# Patient Record
Sex: Female | Born: 1945
Health system: Southern US, Community
[De-identification: ages and names within clinical notes are randomized; demographics above are authoritative.]

## PROBLEM LIST (undated history)

## (undated) DIAGNOSIS — R51 Headache: Secondary | ICD-10-CM

## (undated) DIAGNOSIS — F329 Major depressive disorder, single episode, unspecified: Secondary | ICD-10-CM

## (undated) DIAGNOSIS — Z9889 Other specified postprocedural states: Secondary | ICD-10-CM

## (undated) DIAGNOSIS — M797 Fibromyalgia: Secondary | ICD-10-CM

## (undated) DIAGNOSIS — H3530 Unspecified macular degeneration: Secondary | ICD-10-CM

## (undated) DIAGNOSIS — R0602 Shortness of breath: Secondary | ICD-10-CM

## (undated) DIAGNOSIS — R011 Cardiac murmur, unspecified: Secondary | ICD-10-CM

## (undated) DIAGNOSIS — E039 Hypothyroidism, unspecified: Secondary | ICD-10-CM

## (undated) DIAGNOSIS — J45909 Unspecified asthma, uncomplicated: Secondary | ICD-10-CM

## (undated) DIAGNOSIS — J189 Pneumonia, unspecified organism: Secondary | ICD-10-CM

## (undated) DIAGNOSIS — I4891 Unspecified atrial fibrillation: Secondary | ICD-10-CM

## (undated) DIAGNOSIS — I509 Heart failure, unspecified: Secondary | ICD-10-CM

## (undated) DIAGNOSIS — F32A Depression, unspecified: Secondary | ICD-10-CM

## (undated) DIAGNOSIS — G43909 Migraine, unspecified, not intractable, without status migrainosus: Secondary | ICD-10-CM

## (undated) DIAGNOSIS — R112 Nausea with vomiting, unspecified: Secondary | ICD-10-CM

## (undated) DIAGNOSIS — K219 Gastro-esophageal reflux disease without esophagitis: Secondary | ICD-10-CM

## (undated) DIAGNOSIS — I499 Cardiac arrhythmia, unspecified: Secondary | ICD-10-CM

## (undated) DIAGNOSIS — R42 Dizziness and giddiness: Secondary | ICD-10-CM

## (undated) DIAGNOSIS — M1990 Unspecified osteoarthritis, unspecified site: Secondary | ICD-10-CM

## (undated) DIAGNOSIS — E785 Hyperlipidemia, unspecified: Secondary | ICD-10-CM

## (undated) DIAGNOSIS — I10 Essential (primary) hypertension: Secondary | ICD-10-CM

## (undated) DIAGNOSIS — R0601 Orthopnea: Secondary | ICD-10-CM

## (undated) DIAGNOSIS — I4892 Unspecified atrial flutter: Secondary | ICD-10-CM

## (undated) HISTORY — DX: Heart failure, unspecified: I50.9

## (undated) HISTORY — PX: COLONOSCOPY W/ POLYPECTOMY: SHX1380

## (undated) HISTORY — PX: KNEE ARTHROSCOPY: SUR90

## (undated) HISTORY — PX: COLONOSCOPY W/ BIOPSIES: SHX1374

## (undated) HISTORY — DX: Fibromyalgia: M79.7

## (undated) HISTORY — PX: BREAST SURGERY: SHX581

## (undated) HISTORY — DX: Unspecified atrial flutter: I48.92

## (undated) HISTORY — PX: OOPHORECTOMY: SHX86

## (undated) HISTORY — PX: TUBAL LIGATION: SHX77

## (undated) HISTORY — DX: Unspecified atrial fibrillation: I48.91

## (undated) HISTORY — PX: BREAST BIOPSY: SHX20

## (undated) HISTORY — DX: Unspecified macular degeneration: H35.30

---

## 1971-08-11 HISTORY — PX: DILATION AND CURETTAGE OF UTERUS: SHX78

## 2001-08-10 HISTORY — PX: OTHER SURGICAL HISTORY: SHX169

## 2004-07-02 ENCOUNTER — Ambulatory Visit: Payer: Self-pay | Admitting: Rheumatology

## 2006-07-07 ENCOUNTER — Ambulatory Visit: Payer: Self-pay | Admitting: Internal Medicine

## 2006-07-28 ENCOUNTER — Ambulatory Visit: Payer: Self-pay | Admitting: Unknown Physician Specialty

## 2006-08-05 ENCOUNTER — Ambulatory Visit: Payer: Self-pay

## 2006-08-10 HISTORY — PX: JOINT REPLACEMENT: SHX530

## 2006-08-10 HISTORY — PX: HERNIA REPAIR: SHX51

## 2006-08-10 HISTORY — PX: DIAGNOSTIC LAPAROSCOPY: SUR761

## 2006-08-27 ENCOUNTER — Ambulatory Visit: Payer: Self-pay | Admitting: Unknown Physician Specialty

## 2006-09-13 ENCOUNTER — Ambulatory Visit: Payer: Self-pay | Admitting: Family Medicine

## 2006-09-23 ENCOUNTER — Ambulatory Visit: Payer: Self-pay | Admitting: Obstetrics & Gynecology

## 2006-10-07 ENCOUNTER — Ambulatory Visit: Payer: Self-pay | Admitting: General Practice

## 2007-02-21 ENCOUNTER — Ambulatory Visit: Payer: Self-pay | Admitting: General Surgery

## 2007-02-23 ENCOUNTER — Ambulatory Visit: Payer: Self-pay | Admitting: General Surgery

## 2007-05-02 ENCOUNTER — Ambulatory Visit: Payer: Self-pay | Admitting: General Practice

## 2007-05-18 ENCOUNTER — Inpatient Hospital Stay: Payer: Self-pay | Admitting: General Practice

## 2007-10-12 ENCOUNTER — Ambulatory Visit: Payer: Self-pay | Admitting: Internal Medicine

## 2008-08-10 HISTORY — PX: UPPER GI ENDOSCOPY: SHX6162

## 2009-01-21 ENCOUNTER — Ambulatory Visit: Payer: Self-pay | Admitting: General Surgery

## 2009-03-04 ENCOUNTER — Ambulatory Visit: Payer: Self-pay | Admitting: General Surgery

## 2009-03-14 ENCOUNTER — Ambulatory Visit: Payer: Self-pay | Admitting: Unknown Physician Specialty

## 2009-03-18 ENCOUNTER — Ambulatory Visit: Payer: Self-pay | Admitting: Unknown Physician Specialty

## 2009-07-02 ENCOUNTER — Ambulatory Visit: Payer: Self-pay | Admitting: Internal Medicine

## 2009-10-15 ENCOUNTER — Ambulatory Visit: Payer: Self-pay | Admitting: Internal Medicine

## 2010-05-14 ENCOUNTER — Ambulatory Visit: Payer: Self-pay | Admitting: Internal Medicine

## 2010-08-08 ENCOUNTER — Ambulatory Visit: Payer: Self-pay | Admitting: Internal Medicine

## 2010-08-25 ENCOUNTER — Ambulatory Visit: Payer: Self-pay | Admitting: Podiatry

## 2011-02-07 ENCOUNTER — Ambulatory Visit: Payer: Self-pay | Admitting: Otolaryngology

## 2011-08-11 DIAGNOSIS — J069 Acute upper respiratory infection, unspecified: Secondary | ICD-10-CM | POA: Diagnosis not present

## 2011-08-11 DIAGNOSIS — N390 Urinary tract infection, site not specified: Secondary | ICD-10-CM | POA: Diagnosis not present

## 2011-08-17 DIAGNOSIS — J301 Allergic rhinitis due to pollen: Secondary | ICD-10-CM | POA: Diagnosis not present

## 2011-08-24 DIAGNOSIS — J301 Allergic rhinitis due to pollen: Secondary | ICD-10-CM | POA: Diagnosis not present

## 2011-08-31 DIAGNOSIS — J301 Allergic rhinitis due to pollen: Secondary | ICD-10-CM | POA: Diagnosis not present

## 2011-09-07 DIAGNOSIS — J301 Allergic rhinitis due to pollen: Secondary | ICD-10-CM | POA: Diagnosis not present

## 2011-09-07 DIAGNOSIS — R87610 Atypical squamous cells of undetermined significance on cytologic smear of cervix (ASC-US): Secondary | ICD-10-CM | POA: Diagnosis not present

## 2011-09-07 DIAGNOSIS — Z1151 Encounter for screening for human papillomavirus (HPV): Secondary | ICD-10-CM | POA: Diagnosis not present

## 2011-09-07 DIAGNOSIS — Z01419 Encounter for gynecological examination (general) (routine) without abnormal findings: Secondary | ICD-10-CM | POA: Diagnosis not present

## 2011-09-07 DIAGNOSIS — B3749 Other urogenital candidiasis: Secondary | ICD-10-CM | POA: Diagnosis not present

## 2011-09-07 DIAGNOSIS — R300 Dysuria: Secondary | ICD-10-CM | POA: Diagnosis not present

## 2011-09-14 DIAGNOSIS — J301 Allergic rhinitis due to pollen: Secondary | ICD-10-CM | POA: Diagnosis not present

## 2011-09-17 ENCOUNTER — Ambulatory Visit: Payer: Self-pay | Admitting: Obstetrics & Gynecology

## 2011-09-17 DIAGNOSIS — R928 Other abnormal and inconclusive findings on diagnostic imaging of breast: Secondary | ICD-10-CM | POA: Diagnosis not present

## 2011-09-17 DIAGNOSIS — Z1231 Encounter for screening mammogram for malignant neoplasm of breast: Secondary | ICD-10-CM | POA: Diagnosis not present

## 2011-09-18 DIAGNOSIS — Z1211 Encounter for screening for malignant neoplasm of colon: Secondary | ICD-10-CM | POA: Diagnosis not present

## 2011-09-21 DIAGNOSIS — J301 Allergic rhinitis due to pollen: Secondary | ICD-10-CM | POA: Diagnosis not present

## 2011-09-21 DIAGNOSIS — J449 Chronic obstructive pulmonary disease, unspecified: Secondary | ICD-10-CM | POA: Diagnosis not present

## 2011-09-21 DIAGNOSIS — R0602 Shortness of breath: Secondary | ICD-10-CM | POA: Diagnosis not present

## 2011-09-28 DIAGNOSIS — J301 Allergic rhinitis due to pollen: Secondary | ICD-10-CM | POA: Diagnosis not present

## 2011-10-05 DIAGNOSIS — M159 Polyosteoarthritis, unspecified: Secondary | ICD-10-CM | POA: Diagnosis not present

## 2011-10-05 DIAGNOSIS — E782 Mixed hyperlipidemia: Secondary | ICD-10-CM | POA: Diagnosis not present

## 2011-10-05 DIAGNOSIS — J45909 Unspecified asthma, uncomplicated: Secondary | ICD-10-CM | POA: Diagnosis not present

## 2011-10-05 DIAGNOSIS — J301 Allergic rhinitis due to pollen: Secondary | ICD-10-CM | POA: Diagnosis not present

## 2011-10-05 DIAGNOSIS — R7989 Other specified abnormal findings of blood chemistry: Secondary | ICD-10-CM | POA: Diagnosis not present

## 2011-10-05 DIAGNOSIS — I10 Essential (primary) hypertension: Secondary | ICD-10-CM | POA: Diagnosis not present

## 2011-10-05 DIAGNOSIS — J309 Allergic rhinitis, unspecified: Secondary | ICD-10-CM | POA: Diagnosis not present

## 2011-10-05 DIAGNOSIS — R5381 Other malaise: Secondary | ICD-10-CM | POA: Diagnosis not present

## 2011-10-05 DIAGNOSIS — D518 Other vitamin B12 deficiency anemias: Secondary | ICD-10-CM | POA: Diagnosis not present

## 2011-10-05 DIAGNOSIS — M810 Age-related osteoporosis without current pathological fracture: Secondary | ICD-10-CM | POA: Diagnosis not present

## 2011-10-12 DIAGNOSIS — J301 Allergic rhinitis due to pollen: Secondary | ICD-10-CM | POA: Diagnosis not present

## 2011-10-19 DIAGNOSIS — J301 Allergic rhinitis due to pollen: Secondary | ICD-10-CM | POA: Diagnosis not present

## 2011-10-26 DIAGNOSIS — J301 Allergic rhinitis due to pollen: Secondary | ICD-10-CM | POA: Diagnosis not present

## 2011-10-30 DIAGNOSIS — R5381 Other malaise: Secondary | ICD-10-CM | POA: Diagnosis not present

## 2011-10-30 DIAGNOSIS — J019 Acute sinusitis, unspecified: Secondary | ICD-10-CM | POA: Diagnosis not present

## 2011-10-30 DIAGNOSIS — J029 Acute pharyngitis, unspecified: Secondary | ICD-10-CM | POA: Diagnosis not present

## 2011-10-30 DIAGNOSIS — R070 Pain in throat: Secondary | ICD-10-CM | POA: Diagnosis not present

## 2011-10-30 DIAGNOSIS — R42 Dizziness and giddiness: Secondary | ICD-10-CM | POA: Diagnosis not present

## 2011-11-05 DIAGNOSIS — R0602 Shortness of breath: Secondary | ICD-10-CM | POA: Diagnosis not present

## 2011-11-05 DIAGNOSIS — J301 Allergic rhinitis due to pollen: Secondary | ICD-10-CM | POA: Diagnosis not present

## 2011-11-05 DIAGNOSIS — J441 Chronic obstructive pulmonary disease with (acute) exacerbation: Secondary | ICD-10-CM | POA: Diagnosis not present

## 2011-11-09 DIAGNOSIS — J301 Allergic rhinitis due to pollen: Secondary | ICD-10-CM | POA: Diagnosis not present

## 2011-11-16 DIAGNOSIS — J301 Allergic rhinitis due to pollen: Secondary | ICD-10-CM | POA: Diagnosis not present

## 2011-11-23 DIAGNOSIS — J301 Allergic rhinitis due to pollen: Secondary | ICD-10-CM | POA: Diagnosis not present

## 2011-11-27 DIAGNOSIS — R42 Dizziness and giddiness: Secondary | ICD-10-CM | POA: Diagnosis not present

## 2011-11-27 DIAGNOSIS — R269 Unspecified abnormalities of gait and mobility: Secondary | ICD-10-CM | POA: Diagnosis not present

## 2011-11-30 DIAGNOSIS — J301 Allergic rhinitis due to pollen: Secondary | ICD-10-CM | POA: Diagnosis not present

## 2011-12-07 DIAGNOSIS — J301 Allergic rhinitis due to pollen: Secondary | ICD-10-CM | POA: Diagnosis not present

## 2011-12-11 ENCOUNTER — Ambulatory Visit: Payer: Self-pay | Admitting: Neurology

## 2011-12-11 DIAGNOSIS — R5383 Other fatigue: Secondary | ICD-10-CM | POA: Diagnosis not present

## 2011-12-11 DIAGNOSIS — R51 Headache: Secondary | ICD-10-CM | POA: Diagnosis not present

## 2011-12-11 DIAGNOSIS — M629 Disorder of muscle, unspecified: Secondary | ICD-10-CM | POA: Diagnosis not present

## 2011-12-11 DIAGNOSIS — R5381 Other malaise: Secondary | ICD-10-CM | POA: Diagnosis not present

## 2011-12-11 DIAGNOSIS — R259 Unspecified abnormal involuntary movements: Secondary | ICD-10-CM | POA: Diagnosis not present

## 2011-12-14 DIAGNOSIS — J301 Allergic rhinitis due to pollen: Secondary | ICD-10-CM | POA: Diagnosis not present

## 2011-12-14 DIAGNOSIS — R0602 Shortness of breath: Secondary | ICD-10-CM | POA: Diagnosis not present

## 2011-12-14 DIAGNOSIS — J441 Chronic obstructive pulmonary disease with (acute) exacerbation: Secondary | ICD-10-CM | POA: Diagnosis not present

## 2011-12-14 DIAGNOSIS — J449 Chronic obstructive pulmonary disease, unspecified: Secondary | ICD-10-CM | POA: Diagnosis not present

## 2011-12-23 DIAGNOSIS — J069 Acute upper respiratory infection, unspecified: Secondary | ICD-10-CM | POA: Diagnosis not present

## 2011-12-23 DIAGNOSIS — J309 Allergic rhinitis, unspecified: Secondary | ICD-10-CM | POA: Diagnosis not present

## 2011-12-28 ENCOUNTER — Ambulatory Visit: Payer: Self-pay | Admitting: Internal Medicine

## 2011-12-28 DIAGNOSIS — J441 Chronic obstructive pulmonary disease with (acute) exacerbation: Secondary | ICD-10-CM | POA: Diagnosis not present

## 2011-12-28 DIAGNOSIS — R0602 Shortness of breath: Secondary | ICD-10-CM | POA: Diagnosis not present

## 2011-12-28 DIAGNOSIS — R062 Wheezing: Secondary | ICD-10-CM | POA: Diagnosis not present

## 2011-12-28 DIAGNOSIS — R059 Cough, unspecified: Secondary | ICD-10-CM | POA: Diagnosis not present

## 2011-12-28 DIAGNOSIS — R05 Cough: Secondary | ICD-10-CM | POA: Diagnosis not present

## 2012-01-07 DIAGNOSIS — J301 Allergic rhinitis due to pollen: Secondary | ICD-10-CM | POA: Diagnosis not present

## 2012-01-13 DIAGNOSIS — J301 Allergic rhinitis due to pollen: Secondary | ICD-10-CM | POA: Diagnosis not present

## 2012-01-18 DIAGNOSIS — J301 Allergic rhinitis due to pollen: Secondary | ICD-10-CM | POA: Diagnosis not present

## 2012-01-25 DIAGNOSIS — J301 Allergic rhinitis due to pollen: Secondary | ICD-10-CM | POA: Diagnosis not present

## 2012-02-01 DIAGNOSIS — J301 Allergic rhinitis due to pollen: Secondary | ICD-10-CM | POA: Diagnosis not present

## 2012-02-08 DIAGNOSIS — M159 Polyosteoarthritis, unspecified: Secondary | ICD-10-CM | POA: Diagnosis not present

## 2012-02-08 DIAGNOSIS — J069 Acute upper respiratory infection, unspecified: Secondary | ICD-10-CM | POA: Diagnosis not present

## 2012-02-08 DIAGNOSIS — IMO0001 Reserved for inherently not codable concepts without codable children: Secondary | ICD-10-CM | POA: Diagnosis not present

## 2012-02-15 DIAGNOSIS — J301 Allergic rhinitis due to pollen: Secondary | ICD-10-CM | POA: Diagnosis not present

## 2012-02-22 DIAGNOSIS — J301 Allergic rhinitis due to pollen: Secondary | ICD-10-CM | POA: Diagnosis not present

## 2012-02-29 DIAGNOSIS — J301 Allergic rhinitis due to pollen: Secondary | ICD-10-CM | POA: Diagnosis not present

## 2012-03-07 DIAGNOSIS — J301 Allergic rhinitis due to pollen: Secondary | ICD-10-CM | POA: Diagnosis not present

## 2012-03-14 DIAGNOSIS — J301 Allergic rhinitis due to pollen: Secondary | ICD-10-CM | POA: Diagnosis not present

## 2012-03-21 DIAGNOSIS — J301 Allergic rhinitis due to pollen: Secondary | ICD-10-CM | POA: Diagnosis not present

## 2012-03-29 DIAGNOSIS — J189 Pneumonia, unspecified organism: Secondary | ICD-10-CM | POA: Diagnosis not present

## 2012-04-04 DIAGNOSIS — J449 Chronic obstructive pulmonary disease, unspecified: Secondary | ICD-10-CM | POA: Diagnosis not present

## 2012-04-04 DIAGNOSIS — J189 Pneumonia, unspecified organism: Secondary | ICD-10-CM | POA: Diagnosis not present

## 2012-04-04 DIAGNOSIS — R0602 Shortness of breath: Secondary | ICD-10-CM | POA: Diagnosis not present

## 2012-04-09 DIAGNOSIS — J45901 Unspecified asthma with (acute) exacerbation: Secondary | ICD-10-CM | POA: Diagnosis not present

## 2012-04-12 DIAGNOSIS — J309 Allergic rhinitis, unspecified: Secondary | ICD-10-CM | POA: Diagnosis not present

## 2012-04-12 DIAGNOSIS — J45909 Unspecified asthma, uncomplicated: Secondary | ICD-10-CM | POA: Diagnosis not present

## 2012-04-12 DIAGNOSIS — J449 Chronic obstructive pulmonary disease, unspecified: Secondary | ICD-10-CM | POA: Diagnosis not present

## 2012-04-18 DIAGNOSIS — E785 Hyperlipidemia, unspecified: Secondary | ICD-10-CM | POA: Diagnosis not present

## 2012-04-18 DIAGNOSIS — J301 Allergic rhinitis due to pollen: Secondary | ICD-10-CM | POA: Diagnosis not present

## 2012-04-18 DIAGNOSIS — I10 Essential (primary) hypertension: Secondary | ICD-10-CM | POA: Diagnosis not present

## 2012-04-18 DIAGNOSIS — R0609 Other forms of dyspnea: Secondary | ICD-10-CM | POA: Diagnosis not present

## 2012-04-18 DIAGNOSIS — I471 Supraventricular tachycardia: Secondary | ICD-10-CM | POA: Diagnosis not present

## 2012-04-19 DIAGNOSIS — H2510 Age-related nuclear cataract, unspecified eye: Secondary | ICD-10-CM | POA: Diagnosis not present

## 2012-04-28 DIAGNOSIS — J301 Allergic rhinitis due to pollen: Secondary | ICD-10-CM | POA: Diagnosis not present

## 2012-05-02 DIAGNOSIS — R0602 Shortness of breath: Secondary | ICD-10-CM | POA: Diagnosis not present

## 2012-05-02 DIAGNOSIS — K219 Gastro-esophageal reflux disease without esophagitis: Secondary | ICD-10-CM | POA: Diagnosis not present

## 2012-05-02 DIAGNOSIS — J189 Pneumonia, unspecified organism: Secondary | ICD-10-CM | POA: Diagnosis not present

## 2012-05-02 DIAGNOSIS — J449 Chronic obstructive pulmonary disease, unspecified: Secondary | ICD-10-CM | POA: Diagnosis not present

## 2012-05-02 DIAGNOSIS — M810 Age-related osteoporosis without current pathological fracture: Secondary | ICD-10-CM | POA: Diagnosis not present

## 2012-05-02 DIAGNOSIS — I10 Essential (primary) hypertension: Secondary | ICD-10-CM | POA: Diagnosis not present

## 2012-05-05 DIAGNOSIS — J301 Allergic rhinitis due to pollen: Secondary | ICD-10-CM | POA: Diagnosis not present

## 2012-05-10 DIAGNOSIS — I471 Supraventricular tachycardia: Secondary | ICD-10-CM | POA: Diagnosis not present

## 2012-05-10 DIAGNOSIS — I119 Hypertensive heart disease without heart failure: Secondary | ICD-10-CM | POA: Diagnosis not present

## 2012-05-10 DIAGNOSIS — I059 Rheumatic mitral valve disease, unspecified: Secondary | ICD-10-CM | POA: Diagnosis not present

## 2012-05-10 DIAGNOSIS — R0602 Shortness of breath: Secondary | ICD-10-CM | POA: Diagnosis not present

## 2012-05-12 DIAGNOSIS — J301 Allergic rhinitis due to pollen: Secondary | ICD-10-CM | POA: Diagnosis not present

## 2012-05-13 DIAGNOSIS — E038 Other specified hypothyroidism: Secondary | ICD-10-CM | POA: Diagnosis not present

## 2012-05-13 DIAGNOSIS — E559 Vitamin D deficiency, unspecified: Secondary | ICD-10-CM | POA: Diagnosis not present

## 2012-05-13 DIAGNOSIS — R5381 Other malaise: Secondary | ICD-10-CM | POA: Diagnosis not present

## 2012-05-13 DIAGNOSIS — E782 Mixed hyperlipidemia: Secondary | ICD-10-CM | POA: Diagnosis not present

## 2012-05-13 DIAGNOSIS — D518 Other vitamin B12 deficiency anemias: Secondary | ICD-10-CM | POA: Diagnosis not present

## 2012-05-16 DIAGNOSIS — M549 Dorsalgia, unspecified: Secondary | ICD-10-CM | POA: Diagnosis not present

## 2012-05-16 DIAGNOSIS — L570 Actinic keratosis: Secondary | ICD-10-CM | POA: Diagnosis not present

## 2012-05-16 DIAGNOSIS — M5137 Other intervertebral disc degeneration, lumbosacral region: Secondary | ICD-10-CM | POA: Diagnosis not present

## 2012-05-16 DIAGNOSIS — R42 Dizziness and giddiness: Secondary | ICD-10-CM | POA: Diagnosis not present

## 2012-05-16 DIAGNOSIS — L820 Inflamed seborrheic keratosis: Secondary | ICD-10-CM | POA: Diagnosis not present

## 2012-05-16 DIAGNOSIS — D235 Other benign neoplasm of skin of trunk: Secondary | ICD-10-CM | POA: Diagnosis not present

## 2012-05-19 DIAGNOSIS — J301 Allergic rhinitis due to pollen: Secondary | ICD-10-CM | POA: Diagnosis not present

## 2012-05-23 DIAGNOSIS — J301 Allergic rhinitis due to pollen: Secondary | ICD-10-CM | POA: Diagnosis not present

## 2012-05-27 DIAGNOSIS — J301 Allergic rhinitis due to pollen: Secondary | ICD-10-CM | POA: Diagnosis not present

## 2012-06-02 DIAGNOSIS — J301 Allergic rhinitis due to pollen: Secondary | ICD-10-CM | POA: Diagnosis not present

## 2012-06-02 DIAGNOSIS — R42 Dizziness and giddiness: Secondary | ICD-10-CM | POA: Diagnosis not present

## 2012-06-13 DIAGNOSIS — Z23 Encounter for immunization: Secondary | ICD-10-CM | POA: Diagnosis not present

## 2012-06-13 DIAGNOSIS — E2839 Other primary ovarian failure: Secondary | ICD-10-CM | POA: Diagnosis not present

## 2012-06-16 DIAGNOSIS — J301 Allergic rhinitis due to pollen: Secondary | ICD-10-CM | POA: Diagnosis not present

## 2012-06-18 DIAGNOSIS — N390 Urinary tract infection, site not specified: Secondary | ICD-10-CM | POA: Diagnosis not present

## 2012-06-28 DIAGNOSIS — G479 Sleep disorder, unspecified: Secondary | ICD-10-CM | POA: Diagnosis not present

## 2012-06-28 DIAGNOSIS — R0602 Shortness of breath: Secondary | ICD-10-CM | POA: Diagnosis not present

## 2012-06-28 DIAGNOSIS — R7989 Other specified abnormal findings of blood chemistry: Secondary | ICD-10-CM | POA: Diagnosis not present

## 2012-06-28 DIAGNOSIS — N390 Urinary tract infection, site not specified: Secondary | ICD-10-CM | POA: Diagnosis not present

## 2012-06-28 DIAGNOSIS — R000 Tachycardia, unspecified: Secondary | ICD-10-CM | POA: Diagnosis not present

## 2012-06-28 DIAGNOSIS — J301 Allergic rhinitis due to pollen: Secondary | ICD-10-CM | POA: Diagnosis not present

## 2012-06-28 DIAGNOSIS — E119 Type 2 diabetes mellitus without complications: Secondary | ICD-10-CM | POA: Diagnosis not present

## 2012-06-29 DIAGNOSIS — R0602 Shortness of breath: Secondary | ICD-10-CM | POA: Diagnosis not present

## 2012-07-08 DIAGNOSIS — J019 Acute sinusitis, unspecified: Secondary | ICD-10-CM | POA: Diagnosis not present

## 2012-07-08 DIAGNOSIS — R040 Epistaxis: Secondary | ICD-10-CM | POA: Diagnosis not present

## 2012-07-08 DIAGNOSIS — J449 Chronic obstructive pulmonary disease, unspecified: Secondary | ICD-10-CM | POA: Diagnosis not present

## 2012-07-13 DIAGNOSIS — J301 Allergic rhinitis due to pollen: Secondary | ICD-10-CM | POA: Diagnosis not present

## 2012-07-21 DIAGNOSIS — I10 Essential (primary) hypertension: Secondary | ICD-10-CM | POA: Diagnosis not present

## 2012-07-21 DIAGNOSIS — R5383 Other fatigue: Secondary | ICD-10-CM | POA: Diagnosis not present

## 2012-07-21 DIAGNOSIS — J309 Allergic rhinitis, unspecified: Secondary | ICD-10-CM | POA: Diagnosis not present

## 2012-07-21 DIAGNOSIS — K219 Gastro-esophageal reflux disease without esophagitis: Secondary | ICD-10-CM | POA: Diagnosis not present

## 2012-07-21 DIAGNOSIS — R42 Dizziness and giddiness: Secondary | ICD-10-CM | POA: Diagnosis not present

## 2012-07-21 DIAGNOSIS — R0602 Shortness of breath: Secondary | ICD-10-CM | POA: Diagnosis not present

## 2012-07-21 DIAGNOSIS — R000 Tachycardia, unspecified: Secondary | ICD-10-CM | POA: Diagnosis not present

## 2012-07-21 DIAGNOSIS — E119 Type 2 diabetes mellitus without complications: Secondary | ICD-10-CM | POA: Diagnosis not present

## 2012-07-21 DIAGNOSIS — G479 Sleep disorder, unspecified: Secondary | ICD-10-CM | POA: Diagnosis not present

## 2012-07-21 DIAGNOSIS — R5381 Other malaise: Secondary | ICD-10-CM | POA: Diagnosis not present

## 2012-07-25 DIAGNOSIS — J301 Allergic rhinitis due to pollen: Secondary | ICD-10-CM | POA: Diagnosis not present

## 2012-08-08 DIAGNOSIS — J301 Allergic rhinitis due to pollen: Secondary | ICD-10-CM | POA: Diagnosis not present

## 2012-08-10 HISTORY — PX: BACK SURGERY: SHX140

## 2012-08-22 DIAGNOSIS — J301 Allergic rhinitis due to pollen: Secondary | ICD-10-CM | POA: Diagnosis not present

## 2012-09-05 DIAGNOSIS — J301 Allergic rhinitis due to pollen: Secondary | ICD-10-CM | POA: Diagnosis not present

## 2012-09-19 DIAGNOSIS — J301 Allergic rhinitis due to pollen: Secondary | ICD-10-CM | POA: Diagnosis not present

## 2012-10-03 DIAGNOSIS — J301 Allergic rhinitis due to pollen: Secondary | ICD-10-CM | POA: Diagnosis not present

## 2012-10-11 DIAGNOSIS — J019 Acute sinusitis, unspecified: Secondary | ICD-10-CM | POA: Diagnosis not present

## 2012-10-19 DIAGNOSIS — J301 Allergic rhinitis due to pollen: Secondary | ICD-10-CM | POA: Diagnosis not present

## 2012-11-02 DIAGNOSIS — J301 Allergic rhinitis due to pollen: Secondary | ICD-10-CM | POA: Diagnosis not present

## 2012-11-11 DIAGNOSIS — R42 Dizziness and giddiness: Secondary | ICD-10-CM | POA: Diagnosis not present

## 2012-11-11 DIAGNOSIS — R413 Other amnesia: Secondary | ICD-10-CM | POA: Diagnosis not present

## 2012-11-14 DIAGNOSIS — J301 Allergic rhinitis due to pollen: Secondary | ICD-10-CM | POA: Diagnosis not present

## 2012-11-16 DIAGNOSIS — H2510 Age-related nuclear cataract, unspecified eye: Secondary | ICD-10-CM | POA: Diagnosis not present

## 2012-11-28 DIAGNOSIS — K3189 Other diseases of stomach and duodenum: Secondary | ICD-10-CM | POA: Diagnosis not present

## 2012-11-28 DIAGNOSIS — J301 Allergic rhinitis due to pollen: Secondary | ICD-10-CM | POA: Diagnosis not present

## 2012-11-28 DIAGNOSIS — I059 Rheumatic mitral valve disease, unspecified: Secondary | ICD-10-CM | POA: Diagnosis not present

## 2012-11-28 DIAGNOSIS — E782 Mixed hyperlipidemia: Secondary | ICD-10-CM | POA: Diagnosis not present

## 2012-11-28 DIAGNOSIS — I471 Supraventricular tachycardia: Secondary | ICD-10-CM | POA: Diagnosis not present

## 2012-12-05 DIAGNOSIS — I471 Supraventricular tachycardia: Secondary | ICD-10-CM | POA: Diagnosis not present

## 2012-12-12 DIAGNOSIS — J301 Allergic rhinitis due to pollen: Secondary | ICD-10-CM | POA: Diagnosis not present

## 2012-12-22 DIAGNOSIS — E782 Mixed hyperlipidemia: Secondary | ICD-10-CM | POA: Diagnosis not present

## 2012-12-22 DIAGNOSIS — I10 Essential (primary) hypertension: Secondary | ICD-10-CM | POA: Diagnosis not present

## 2012-12-22 DIAGNOSIS — M159 Polyosteoarthritis, unspecified: Secondary | ICD-10-CM | POA: Diagnosis not present

## 2012-12-22 DIAGNOSIS — K219 Gastro-esophageal reflux disease without esophagitis: Secondary | ICD-10-CM | POA: Diagnosis not present

## 2012-12-22 DIAGNOSIS — M810 Age-related osteoporosis without current pathological fracture: Secondary | ICD-10-CM | POA: Diagnosis not present

## 2012-12-22 DIAGNOSIS — Z006 Encounter for examination for normal comparison and control in clinical research program: Secondary | ICD-10-CM | POA: Diagnosis not present

## 2012-12-22 DIAGNOSIS — E119 Type 2 diabetes mellitus without complications: Secondary | ICD-10-CM | POA: Diagnosis not present

## 2012-12-22 DIAGNOSIS — J45909 Unspecified asthma, uncomplicated: Secondary | ICD-10-CM | POA: Diagnosis not present

## 2012-12-26 DIAGNOSIS — R87610 Atypical squamous cells of undetermined significance on cytologic smear of cervix (ASC-US): Secondary | ICD-10-CM | POA: Diagnosis not present

## 2012-12-26 DIAGNOSIS — Z1231 Encounter for screening mammogram for malignant neoplasm of breast: Secondary | ICD-10-CM | POA: Diagnosis not present

## 2012-12-26 DIAGNOSIS — J301 Allergic rhinitis due to pollen: Secondary | ICD-10-CM | POA: Diagnosis not present

## 2012-12-26 DIAGNOSIS — N952 Postmenopausal atrophic vaginitis: Secondary | ICD-10-CM | POA: Diagnosis not present

## 2012-12-26 DIAGNOSIS — Z01419 Encounter for gynecological examination (general) (routine) without abnormal findings: Secondary | ICD-10-CM | POA: Diagnosis not present

## 2012-12-26 DIAGNOSIS — Z1389 Encounter for screening for other disorder: Secondary | ICD-10-CM | POA: Diagnosis not present

## 2013-01-09 DIAGNOSIS — E781 Pure hyperglyceridemia: Secondary | ICD-10-CM | POA: Diagnosis not present

## 2013-01-09 DIAGNOSIS — J301 Allergic rhinitis due to pollen: Secondary | ICD-10-CM | POA: Diagnosis not present

## 2013-01-09 DIAGNOSIS — R0602 Shortness of breath: Secondary | ICD-10-CM | POA: Diagnosis not present

## 2013-01-09 DIAGNOSIS — I4949 Other premature depolarization: Secondary | ICD-10-CM | POA: Diagnosis not present

## 2013-01-09 DIAGNOSIS — I119 Hypertensive heart disease without heart failure: Secondary | ICD-10-CM | POA: Diagnosis not present

## 2013-01-13 ENCOUNTER — Ambulatory Visit: Payer: Self-pay | Admitting: Physician Assistant

## 2013-01-13 DIAGNOSIS — M545 Low back pain, unspecified: Secondary | ICD-10-CM | POA: Diagnosis not present

## 2013-01-13 DIAGNOSIS — M25559 Pain in unspecified hip: Secondary | ICD-10-CM | POA: Diagnosis not present

## 2013-01-13 DIAGNOSIS — N390 Urinary tract infection, site not specified: Secondary | ICD-10-CM | POA: Diagnosis not present

## 2013-01-13 DIAGNOSIS — M4310 Spondylolisthesis, site unspecified: Secondary | ICD-10-CM | POA: Diagnosis not present

## 2013-01-13 DIAGNOSIS — I10 Essential (primary) hypertension: Secondary | ICD-10-CM | POA: Diagnosis not present

## 2013-01-13 DIAGNOSIS — IMO0002 Reserved for concepts with insufficient information to code with codable children: Secondary | ICD-10-CM | POA: Diagnosis not present

## 2013-01-13 DIAGNOSIS — M5430 Sciatica, unspecified side: Secondary | ICD-10-CM | POA: Diagnosis not present

## 2013-01-13 DIAGNOSIS — M5137 Other intervertebral disc degeneration, lumbosacral region: Secondary | ICD-10-CM | POA: Diagnosis not present

## 2013-01-13 DIAGNOSIS — Q762 Congenital spondylolisthesis: Secondary | ICD-10-CM | POA: Diagnosis not present

## 2013-01-13 DIAGNOSIS — R000 Tachycardia, unspecified: Secondary | ICD-10-CM | POA: Diagnosis not present

## 2013-01-20 DIAGNOSIS — M169 Osteoarthritis of hip, unspecified: Secondary | ICD-10-CM | POA: Diagnosis not present

## 2013-01-20 DIAGNOSIS — M545 Low back pain, unspecified: Secondary | ICD-10-CM | POA: Diagnosis not present

## 2013-01-23 DIAGNOSIS — J449 Chronic obstructive pulmonary disease, unspecified: Secondary | ICD-10-CM | POA: Diagnosis not present

## 2013-01-23 DIAGNOSIS — J309 Allergic rhinitis, unspecified: Secondary | ICD-10-CM | POA: Diagnosis not present

## 2013-02-03 ENCOUNTER — Ambulatory Visit: Payer: Self-pay | Admitting: Physician Assistant

## 2013-02-03 DIAGNOSIS — M5126 Other intervertebral disc displacement, lumbar region: Secondary | ICD-10-CM | POA: Diagnosis not present

## 2013-02-06 DIAGNOSIS — J301 Allergic rhinitis due to pollen: Secondary | ICD-10-CM | POA: Diagnosis not present

## 2013-02-06 DIAGNOSIS — M545 Low back pain, unspecified: Secondary | ICD-10-CM | POA: Diagnosis not present

## 2013-02-06 DIAGNOSIS — J309 Allergic rhinitis, unspecified: Secondary | ICD-10-CM | POA: Diagnosis not present

## 2013-02-06 DIAGNOSIS — IMO0002 Reserved for concepts with insufficient information to code with codable children: Secondary | ICD-10-CM | POA: Diagnosis not present

## 2013-02-06 DIAGNOSIS — M5126 Other intervertebral disc displacement, lumbar region: Secondary | ICD-10-CM | POA: Diagnosis not present

## 2013-02-16 DIAGNOSIS — J309 Allergic rhinitis, unspecified: Secondary | ICD-10-CM | POA: Diagnosis not present

## 2013-02-16 DIAGNOSIS — R059 Cough, unspecified: Secondary | ICD-10-CM | POA: Diagnosis not present

## 2013-02-16 DIAGNOSIS — J069 Acute upper respiratory infection, unspecified: Secondary | ICD-10-CM | POA: Diagnosis not present

## 2013-02-16 DIAGNOSIS — J060 Acute laryngopharyngitis: Secondary | ICD-10-CM | POA: Diagnosis not present

## 2013-02-16 DIAGNOSIS — R5381 Other malaise: Secondary | ICD-10-CM | POA: Diagnosis not present

## 2013-02-16 DIAGNOSIS — J45909 Unspecified asthma, uncomplicated: Secondary | ICD-10-CM | POA: Diagnosis not present

## 2013-02-16 DIAGNOSIS — R05 Cough: Secondary | ICD-10-CM | POA: Diagnosis not present

## 2013-02-16 DIAGNOSIS — R5383 Other fatigue: Secondary | ICD-10-CM | POA: Diagnosis not present

## 2013-02-20 DIAGNOSIS — J301 Allergic rhinitis due to pollen: Secondary | ICD-10-CM | POA: Diagnosis not present

## 2013-03-03 DIAGNOSIS — M549 Dorsalgia, unspecified: Secondary | ICD-10-CM | POA: Diagnosis not present

## 2013-03-06 ENCOUNTER — Other Ambulatory Visit: Payer: Self-pay | Admitting: Neurosurgery

## 2013-03-06 DIAGNOSIS — M549 Dorsalgia, unspecified: Secondary | ICD-10-CM

## 2013-03-06 DIAGNOSIS — J301 Allergic rhinitis due to pollen: Secondary | ICD-10-CM | POA: Diagnosis not present

## 2013-03-07 ENCOUNTER — Ambulatory Visit
Admission: RE | Admit: 2013-03-07 | Discharge: 2013-03-07 | Disposition: A | Discharge status: Home / Self Care | Payer: Medicare Other | Source: Ambulatory Visit | Attending: Neurosurgery | Admitting: Neurosurgery

## 2013-03-07 VITALS — BP 162/85 | HR 86

## 2013-03-07 DIAGNOSIS — M545 Low back pain, unspecified: Secondary | ICD-10-CM | POA: Diagnosis not present

## 2013-03-07 DIAGNOSIS — M79609 Pain in unspecified limb: Secondary | ICD-10-CM | POA: Diagnosis not present

## 2013-03-07 DIAGNOSIS — M47817 Spondylosis without myelopathy or radiculopathy, lumbosacral region: Secondary | ICD-10-CM | POA: Diagnosis not present

## 2013-03-07 DIAGNOSIS — M549 Dorsalgia, unspecified: Secondary | ICD-10-CM

## 2013-03-07 MED ORDER — METHYLPREDNISOLONE ACETATE 40 MG/ML INJ SUSP (RADIOLOG
120.0000 mg | Freq: Once | INTRAMUSCULAR | Status: AC
  Administered 2013-03-07: 120 mg via EPIDURAL

## 2013-03-07 MED ORDER — IOHEXOL 180 MG/ML  SOLN
1.0000 mL | Freq: Once | INTRAMUSCULAR | Status: AC | PRN
  Administered 2013-03-07: 1 mL via EPIDURAL

## 2013-03-15 ENCOUNTER — Other Ambulatory Visit: Payer: Self-pay | Admitting: Neurosurgery

## 2013-03-15 DIAGNOSIS — M549 Dorsalgia, unspecified: Secondary | ICD-10-CM | POA: Diagnosis not present

## 2013-03-16 ENCOUNTER — Encounter (HOSPITAL_COMMUNITY): Payer: Self-pay | Admitting: Pharmacy Technician

## 2013-03-16 NOTE — Pre-Procedure Instructions (Signed)
[name]  03/16/2013   Your procedure is scheduled on:  Tuesday, August 12th  Report to [facility] at 0945 AM.  Call this number if you have problems the morning of surgery: [phone]   Remember:   Do not eat food or drink liquids after midnight.   Take these medicines the morning of surgery with A SIP OF WATER: diltiazem, albuterol if needed, tylenol if needed, protonix, vicodin if needed  Stop taking aspirin, naproxen, vitamin E 5 days prior to surgery   Do not wear jewelry, make-up or nail polish.  Do not wear lotions, powders, or perfume, deodorant.  Do not shave 48 hours prior to surgery. Men may shave face and neck.  Do not bring valuables to the hospital.  [facility] is not responsible                   for any belongings or valuables.  Contacts, dentures or bridgework may not be worn into surgery.  Leave suitcase in the car. After surgery it may be brought to your room.  For patients admitted to the hospital, checkout time is 11:00 AM the day of discharge.   Patients discharged the day of surgery will not be allowed to drive home.    Special Instructions: Shower using CHG 2 nights before surgery and the night before surgery.  If you shower the day of surgery use CHG.  Use special wash - you have one bottle of CHG for all showers.  You should use approximately 1/3 of the bottle for each shower.   Please read over the following fact sheets that you were given: Pain Booklet, Coughing and Deep Breathing, MRSA Information and Surgical Site Infection Prevention

## 2013-03-17 ENCOUNTER — Encounter (HOSPITAL_COMMUNITY)
Admission: RE | Admit: 2013-03-17 | Discharge: 2013-03-17 | Disposition: A | Discharge status: Home / Self Care | Payer: Medicare Other | Source: Ambulatory Visit | Attending: Neurosurgery | Admitting: Neurosurgery

## 2013-03-17 ENCOUNTER — Other Ambulatory Visit: Payer: Self-pay | Admitting: Neurosurgery

## 2013-03-17 ENCOUNTER — Encounter (HOSPITAL_COMMUNITY): Payer: Self-pay

## 2013-03-17 ENCOUNTER — Encounter (HOSPITAL_COMMUNITY)
Admission: RE | Admit: 2013-03-17 | Discharge: 2013-03-17 | Disposition: A | Discharge status: Home / Self Care | Payer: Medicare Other | Source: Ambulatory Visit | Attending: Anesthesiology | Admitting: Anesthesiology

## 2013-03-17 DIAGNOSIS — J301 Allergic rhinitis due to pollen: Secondary | ICD-10-CM | POA: Diagnosis not present

## 2013-03-17 DIAGNOSIS — Z01812 Encounter for preprocedural laboratory examination: Secondary | ICD-10-CM | POA: Insufficient documentation

## 2013-03-17 DIAGNOSIS — Z01818 Encounter for other preprocedural examination: Secondary | ICD-10-CM | POA: Diagnosis not present

## 2013-03-17 DIAGNOSIS — Z01810 Encounter for preprocedural cardiovascular examination: Secondary | ICD-10-CM | POA: Diagnosis not present

## 2013-03-17 HISTORY — DX: Headache: R51

## 2013-03-17 HISTORY — DX: Cardiac murmur, unspecified: R01.1

## 2013-03-17 HISTORY — DX: Shortness of breath: R06.02

## 2013-03-17 HISTORY — DX: Essential (primary) hypertension: I10

## 2013-03-17 HISTORY — DX: Migraine, unspecified, not intractable, without status migrainosus: G43.909

## 2013-03-17 HISTORY — DX: Cardiac arrhythmia, unspecified: I49.9

## 2013-03-17 HISTORY — DX: Pneumonia, unspecified organism: J18.9

## 2013-03-17 HISTORY — DX: Gastro-esophageal reflux disease without esophagitis: K21.9

## 2013-03-17 HISTORY — DX: Dizziness and giddiness: R42

## 2013-03-17 HISTORY — DX: Unspecified osteoarthritis, unspecified site: M19.90

## 2013-03-17 HISTORY — DX: Nausea with vomiting, unspecified: R11.2

## 2013-03-17 HISTORY — DX: Unspecified asthma, uncomplicated: J45.909

## 2013-03-17 HISTORY — DX: Other specified postprocedural states: Z98.890

## 2013-03-17 LAB — BASIC METABOLIC PANEL
Chloride: 100 mEq/L (ref 96–112)
GFR calc Af Amer: >90 mL/min (ref >90)
GFR calc non Af Amer: 88 mL/min — ABNORMAL LOW (ref >90)
Potassium: 3.6 mEq/L (ref 3.5–5.1)
Sodium: 140 mEq/L (ref 135–145)

## 2013-03-17 LAB — CBC
HCT: 39.4 % (ref 36.0–46.0)
MCHC: 33.5 g/dL (ref 30.0–36.0)
RDW: 15.1 % (ref 11.5–15.5)
WBC: 6.7 10*3/uL (ref 4.0–10.5)

## 2013-03-17 LAB — SURGICAL PCR SCREEN
MRSA, PCR: NEGATIVE
Staphylococcus aureus: POSITIVE — AB

## 2013-03-17 NOTE — Progress Notes (Signed)
vanessa at dr. Trudee Grip office notified to call in prescription to [facility] in [place]. Also notified to put in orders for patient

## 2013-03-17 NOTE — Progress Notes (Signed)
Anesthesia Chart Review:  Patient is a 67 year old female posted for one level lumbar laminectomy/decompression microdiscectomy on 03/21/13 by Dr. Gerlene Fee.  History includes non-smoker, HTN, OSA (cannot tolerate CPAP), palpitations/tachycardia (PVCs), murmur (mild MR/TR by 04/2012 echo), chronic DOE, asthma, PNA, migraines, osteoarthritis, right TKR, right breast lumpectomy, hernia repair.  PCP is Dr. Beverely Risen.  Cardiologist is Dr. Arnoldo Hooker, last visit 01/09/13 for follow-up palpitations felt most consistent with preventricular contractions recently seen on Holter monitor. She is already on diltiazem and not further intervention was recommended at that time. B-blockers have been avoided due to "significant side effects" and "concerns of bradycardia."  Holter on 12/05/12 showed occasional PACs and PVCs with no evidence of malignant SVT or heart block.    Echo on 05/02/12 showed normal LV systolic function, EF 70%.  Mild MR/TR.  Exercise treadmill test on 09/05/08 showed no EKG evidence of ischemia or arrhythmia, slightly decreased exercise tolerance for age, normal LV systolic function with EF 72%, normal myocardial perfusion without evidence of myocardial ischemia.  EKG on 03/17/13 showed NSR, possible anterior infarct (age undetermined).  Borderline LAD. Overall, I think her EKG is stable since 03/18/11 [facility]).  CXR on 03/17/13 showed no active disease. Mild elevation of the right hemidiaphragm. Mild hyperinflation.  Preoperative labs noted.  Anticipate that she can proceed as planned.  Velna Ochs [facility]/Anesthesiology Phone [phone] 03/17/2013 2:00 PM

## 2013-03-17 NOTE — Progress Notes (Signed)
Primary physician - dr. Beverely Risen Cardiologist - dr [facility] Echo, stress test within last few years - will request records

## 2013-03-20 MED ORDER — VANCOMYCIN HCL IN DEXTROSE 1-5 GM/200ML-% IV SOLN
1000.0000 mg | INTRAVENOUS | Status: AC
  Administered 2013-03-21: 1000 mg via INTRAVENOUS
  Filled 2013-03-20: qty 200

## 2013-03-20 MED ORDER — DEXAMETHASONE SODIUM PHOSPHATE 10 MG/ML IJ SOLN
10.0000 mg | INTRAMUSCULAR | Status: AC
  Administered 2013-03-21: 10 mg via INTRAVENOUS
  Filled 2013-03-20: qty 1

## 2013-03-21 ENCOUNTER — Inpatient Hospital Stay (HOSPITAL_COMMUNITY): Payer: Medicare Other | Admitting: Anesthesiology

## 2013-03-21 ENCOUNTER — Inpatient Hospital Stay (HOSPITAL_COMMUNITY)
Admission: RE | Admit: 2013-03-21 | Discharge: 2013-03-22 | DRG: 491 | Disposition: A | Discharge status: Home / Self Care | Payer: Medicare Other | Source: Ambulatory Visit | Attending: Neurosurgery | Admitting: Neurosurgery

## 2013-03-21 ENCOUNTER — Encounter (HOSPITAL_COMMUNITY): Payer: Self-pay | Admitting: Vascular Surgery

## 2013-03-21 ENCOUNTER — Encounter (HOSPITAL_COMMUNITY): Payer: Self-pay | Admitting: Anesthesiology

## 2013-03-21 ENCOUNTER — Encounter (HOSPITAL_COMMUNITY)
Admission: RE | Disposition: A | Discharge status: Home / Self Care | Payer: Self-pay | Source: Ambulatory Visit | Attending: Neurosurgery

## 2013-03-21 ENCOUNTER — Inpatient Hospital Stay (HOSPITAL_COMMUNITY): Payer: Medicare Other

## 2013-03-21 DIAGNOSIS — K219 Gastro-esophageal reflux disease without esophagitis: Secondary | ICD-10-CM | POA: Diagnosis present

## 2013-03-21 DIAGNOSIS — Z79899 Other long term (current) drug therapy: Secondary | ICD-10-CM | POA: Diagnosis not present

## 2013-03-21 DIAGNOSIS — M659 Synovitis and tenosynovitis, unspecified: Secondary | ICD-10-CM | POA: Diagnosis not present

## 2013-03-21 DIAGNOSIS — M7130 Other bursal cyst, unspecified site: Principal | ICD-10-CM | POA: Diagnosis present

## 2013-03-21 DIAGNOSIS — Z7982 Long term (current) use of aspirin: Secondary | ICD-10-CM

## 2013-03-21 DIAGNOSIS — Z888 Allergy status to other drugs, medicaments and biological substances status: Secondary | ICD-10-CM | POA: Diagnosis not present

## 2013-03-21 DIAGNOSIS — I10 Essential (primary) hypertension: Secondary | ICD-10-CM | POA: Diagnosis not present

## 2013-03-21 DIAGNOSIS — M7138 Other bursal cyst, other site: Secondary | ICD-10-CM

## 2013-03-21 DIAGNOSIS — Z882 Allergy status to sulfonamides status: Secondary | ICD-10-CM

## 2013-03-21 DIAGNOSIS — Z881 Allergy status to other antibiotic agents status: Secondary | ICD-10-CM | POA: Diagnosis not present

## 2013-03-21 DIAGNOSIS — G4730 Sleep apnea, unspecified: Secondary | ICD-10-CM | POA: Diagnosis present

## 2013-03-21 DIAGNOSIS — Z96659 Presence of unspecified artificial knee joint: Secondary | ICD-10-CM | POA: Diagnosis not present

## 2013-03-21 DIAGNOSIS — Z886 Allergy status to analgesic agent status: Secondary | ICD-10-CM | POA: Diagnosis not present

## 2013-03-21 DIAGNOSIS — R0602 Shortness of breath: Secondary | ICD-10-CM | POA: Diagnosis not present

## 2013-03-21 DIAGNOSIS — M519 Unspecified thoracic, thoracolumbar and lumbosacral intervertebral disc disorder: Secondary | ICD-10-CM | POA: Diagnosis not present

## 2013-03-21 HISTORY — PX: LUMBAR LAMINECTOMY/DECOMPRESSION MICRODISCECTOMY: SHX5026

## 2013-03-21 SURGERY — LUMBAR LAMINECTOMY/DECOMPRESSION MICRODISCECTOMY 1 LEVEL
Anesthesia: General | Site: Spine Lumbar | Laterality: Right | Wound class: Clean

## 2013-03-21 MED ORDER — LACTATED RINGERS IV SOLN
INTRAVENOUS | Status: DC | PRN
  Administered 2013-03-21 (×2): via INTRAVENOUS

## 2013-03-21 MED ORDER — DOCUSATE SODIUM 100 MG PO CAPS
100.0000 mg | ORAL_CAPSULE | Freq: Two times a day (BID) | ORAL | Status: DC
  Administered 2013-03-21 – 2013-03-22 (×3): 100 mg via ORAL
  Filled 2013-03-21 (×3): qty 1

## 2013-03-21 MED ORDER — HYDROMORPHONE HCL PF 1 MG/ML IJ SOLN
1.0000 mg | INTRAMUSCULAR | Status: DC | PRN
  Administered 2013-03-21 – 2013-03-22 (×2): 1 mg via INTRAMUSCULAR
  Filled 2013-03-21 (×2): qty 1

## 2013-03-21 MED ORDER — SODIUM CHLORIDE 0.9 % IJ SOLN: 3.0000 mL | INTRAMUSCULAR | Status: DC | PRN

## 2013-03-21 MED ORDER — PROPOFOL 10 MG/ML IV BOLUS
INTRAVENOUS | Status: DC | PRN
  Administered 2013-03-21: 150 mg via INTRAVENOUS

## 2013-03-21 MED ORDER — ACETAMINOPHEN 325 MG PO TABS
650.0000 mg | ORAL_TABLET | ORAL | Status: DC | PRN
  Administered 2013-03-21 – 2013-03-22 (×2): 650 mg via ORAL
  Filled 2013-03-21 (×2): qty 2

## 2013-03-21 MED ORDER — VANCOMYCIN HCL IN DEXTROSE 1-5 GM/200ML-% IV SOLN
1000.0000 mg | Freq: Once | INTRAVENOUS | Status: AC
  Administered 2013-03-21: 1000 mg via INTRAVENOUS
  Filled 2013-03-21: qty 200

## 2013-03-21 MED ORDER — 0.9 % SODIUM CHLORIDE (POUR BTL) OPTIME
TOPICAL | Status: DC | PRN
  Administered 2013-03-21: 1000 mL

## 2013-03-21 MED ORDER — ALBUTEROL SULFATE HFA 108 (90 BASE) MCG/ACT IN AERS
2.0000 | INHALATION_SPRAY | Freq: Four times a day (QID) | RESPIRATORY_TRACT | Status: DC | PRN
  Filled 2013-03-21: qty 6.7

## 2013-03-21 MED ORDER — DEXAMETHASONE 4 MG PO TABS
4.0000 mg | ORAL_TABLET | Freq: Four times a day (QID) | ORAL | Status: AC
  Administered 2013-03-21: 4 mg via ORAL
  Filled 2013-03-21: qty 1

## 2013-03-21 MED ORDER — GLYCOPYRROLATE 0.2 MG/ML IJ SOLN
INTRAMUSCULAR | Status: DC | PRN
  Administered 2013-03-21: 0.6 mg via INTRAVENOUS

## 2013-03-21 MED ORDER — HEMOSTATIC AGENTS (NO CHARGE) OPTIME
TOPICAL | Status: DC | PRN
  Administered 2013-03-21: 1 via TOPICAL

## 2013-03-21 MED ORDER — METHOCARBAMOL 100 MG/ML IJ SOLN
500.0000 mg | Freq: Four times a day (QID) | INTRAVENOUS | Status: DC | PRN
  Filled 2013-03-21: qty 5

## 2013-03-21 MED ORDER — DIPHENHYDRAMINE HCL 25 MG PO CAPS
25.0000 mg | ORAL_CAPSULE | Freq: Four times a day (QID) | ORAL | Status: DC | PRN
  Administered 2013-03-22 (×2): 25 mg via ORAL
  Filled 2013-03-21 (×2): qty 1

## 2013-03-21 MED ORDER — HYDROMORPHONE HCL PF 1 MG/ML IJ SOLN
0.2500 mg | INTRAMUSCULAR | Status: DC | PRN
  Administered 2013-03-21 (×2): 0.5 mg via INTRAVENOUS

## 2013-03-21 MED ORDER — ACETAMINOPHEN 650 MG RE SUPP: 650.0000 mg | RECTAL | Status: DC | PRN

## 2013-03-21 MED ORDER — MUPIROCIN 2 % EX OINT
1.0000 "application " | TOPICAL_OINTMENT | Freq: Two times a day (BID) | CUTANEOUS | Status: DC
  Administered 2013-03-22: 1 via NASAL

## 2013-03-21 MED ORDER — ARTIFICIAL TEARS OP OINT
TOPICAL_OINTMENT | OPHTHALMIC | Status: DC | PRN
  Administered 2013-03-21: 1 via OPHTHALMIC

## 2013-03-21 MED ORDER — BACITRACIN 50000 UNITS IM SOLR
INTRAMUSCULAR | Status: AC
  Filled 2013-03-21: qty 1

## 2013-03-21 MED ORDER — SODIUM CHLORIDE 0.9 % IR SOLN
Status: DC | PRN
  Administered 2013-03-21: 12:00:00

## 2013-03-21 MED ORDER — BUPIVACAINE HCL (PF) 0.5 % IJ SOLN
INTRAMUSCULAR | Status: DC | PRN
  Administered 2013-03-21: 20 mL

## 2013-03-21 MED ORDER — PHENYLEPHRINE HCL 10 MG/ML IJ SOLN
INTRAMUSCULAR | Status: DC | PRN
  Administered 2013-03-21 (×2): 40 ug via INTRAVENOUS

## 2013-03-21 MED ORDER — HYDROMORPHONE HCL PF 1 MG/ML IJ SOLN
INTRAMUSCULAR | Status: AC
  Filled 2013-03-21: qty 1

## 2013-03-21 MED ORDER — MIDAZOLAM HCL 5 MG/5ML IJ SOLN
INTRAMUSCULAR | Status: DC | PRN
  Administered 2013-03-21: 1 mg via INTRAVENOUS

## 2013-03-21 MED ORDER — ONDANSETRON HCL 4 MG/2ML IJ SOLN: 4.0000 mg | Freq: Once | INTRAMUSCULAR | Status: DC | PRN

## 2013-03-21 MED ORDER — ONDANSETRON HCL 4 MG/2ML IJ SOLN: 4.0000 mg | INTRAMUSCULAR | Status: DC | PRN

## 2013-03-21 MED ORDER — DILTIAZEM HCL ER 180 MG PO CP24
180.0000 mg | ORAL_CAPSULE | Freq: Every day | ORAL | Status: DC
  Administered 2013-03-21: 180 mg via ORAL
  Filled 2013-03-21 (×2): qty 1

## 2013-03-21 MED ORDER — SUFENTANIL CITRATE 50 MCG/ML IV SOLN
INTRAVENOUS | Status: DC | PRN
  Administered 2013-03-21 (×3): 5 ug via INTRAVENOUS

## 2013-03-21 MED ORDER — DEXAMETHASONE SODIUM PHOSPHATE 4 MG/ML IJ SOLN: 4.0000 mg | Freq: Four times a day (QID) | INTRAMUSCULAR | Status: AC

## 2013-03-21 MED ORDER — SODIUM CHLORIDE 0.9 % IV SOLN
INTRAVENOUS | Status: AC
Start: 2013-03-21 — End: 2013-03-21
  Filled 2013-03-21: qty 500

## 2013-03-21 MED ORDER — MENTHOL 3 MG MT LOZG
1.0000 | LOZENGE | OROMUCOSAL | Status: DC | PRN
  Administered 2013-03-21: 3 mg via ORAL
  Filled 2013-03-21: qty 9

## 2013-03-21 MED ORDER — KCL IN DEXTROSE-NACL 20-5-0.45 MEQ/L-%-% IV SOLN
80.0000 mL/h | INTRAVENOUS | Status: DC
  Filled 2013-03-21 (×3): qty 1000

## 2013-03-21 MED ORDER — ONDANSETRON HCL 4 MG/2ML IJ SOLN
INTRAMUSCULAR | Status: DC | PRN
  Administered 2013-03-21: 4 mg via INTRAVENOUS

## 2013-03-21 MED ORDER — LIDOCAINE HCL (CARDIAC) 20 MG/ML IV SOLN
INTRAVENOUS | Status: DC | PRN
  Administered 2013-03-21: 70 mg via INTRAVENOUS

## 2013-03-21 MED ORDER — THROMBIN 5000 UNITS EX SOLR
CUTANEOUS | Status: DC | PRN
  Administered 2013-03-21 (×2): 5000 [IU] via TOPICAL

## 2013-03-21 MED ORDER — PHENOL 1.4 % MT LIQD: 1.0000 | OROMUCOSAL | Status: DC | PRN

## 2013-03-21 MED ORDER — FLUTICASONE PROPIONATE 50 MCG/ACT NA SUSP
2.0000 | Freq: Every day | NASAL | Status: DC
  Administered 2013-03-21 – 2013-03-22 (×2): 2 via NASAL
  Filled 2013-03-21: qty 16

## 2013-03-21 MED ORDER — OXYCODONE HCL 5 MG PO TABS: 5.0000 mg | ORAL_TABLET | Freq: Once | ORAL | Status: DC | PRN

## 2013-03-21 MED ORDER — SODIUM CHLORIDE 0.9 % IV SOLN
INTRAVENOUS | Status: DC | PRN
  Administered 2013-03-21: 11:00:00 via INTRAVENOUS

## 2013-03-21 MED ORDER — SODIUM CHLORIDE 0.9 % IV SOLN: 250.0000 mL | INTRAVENOUS | Status: DC

## 2013-03-21 MED ORDER — SODIUM CHLORIDE 0.9 % IJ SOLN
3.0000 mL | Freq: Two times a day (BID) | INTRAMUSCULAR | Status: DC
  Administered 2013-03-21: 3 mL via INTRAVENOUS

## 2013-03-21 MED ORDER — NEOSTIGMINE METHYLSULFATE 1 MG/ML IJ SOLN
INTRAMUSCULAR | Status: DC | PRN
  Administered 2013-03-21: 5 mg via INTRAVENOUS

## 2013-03-21 MED ORDER — OXYCODONE HCL 5 MG/5ML PO SOLN: 5.0000 mg | Freq: Once | ORAL | Status: DC | PRN

## 2013-03-21 MED ORDER — LACTATED RINGERS IV SOLN
INTRAVENOUS | Status: DC
  Administered 2013-03-21: 10:00:00 via INTRAVENOUS

## 2013-03-21 MED ORDER — HYDROCODONE-ACETAMINOPHEN 5-325 MG PO TABS: 1.0000 | ORAL_TABLET | ORAL | Status: DC | PRN

## 2013-03-21 MED ORDER — ROCURONIUM BROMIDE 100 MG/10ML IV SOLN
INTRAVENOUS | Status: DC | PRN
  Administered 2013-03-21: 50 mg via INTRAVENOUS

## 2013-03-21 MED ORDER — METHOCARBAMOL 500 MG PO TABS: 500.0000 mg | ORAL_TABLET | Freq: Four times a day (QID) | ORAL | Status: DC | PRN

## 2013-03-21 SURGICAL SUPPLY — 55 items
BAG DECANTER FOR FLEXI CONT (MISCELLANEOUS) ×2 IMPLANT
BENZOIN TINCTURE PRP APPL 2/3 (GAUZE/BANDAGES/DRESSINGS) ×2 IMPLANT
BLADE SURG ROTATE 9660 (MISCELLANEOUS) IMPLANT
BRUSH SCRUB EZ PLAIN DRY (MISCELLANEOUS) ×2 IMPLANT
BUR CUTTER 7.0 ROUND (BURR) ×2 IMPLANT
BUR MATCHSTICK NEURO 3.0 LAGG (BURR) ×2 IMPLANT
CANISTER SUCTION 2500CC (MISCELLANEOUS) ×2 IMPLANT
CLOTH BEACON ORANGE TIMEOUT ST (SAFETY) ×2 IMPLANT
CONT SPEC 4OZ CLIKSEAL STRL BL (MISCELLANEOUS) ×2 IMPLANT
DERMABOND ADHESIVE PROPEN (GAUZE/BANDAGES/DRESSINGS) ×1
DERMABOND ADVANCED (GAUZE/BANDAGES/DRESSINGS)
DERMABOND ADVANCED .7 DNX12 (GAUZE/BANDAGES/DRESSINGS) IMPLANT
DERMABOND ADVANCED .7 DNX6 (GAUZE/BANDAGES/DRESSINGS) ×1 IMPLANT
DRAPE LAPAROTOMY 100X72X124 (DRAPES) ×2 IMPLANT
DRAPE MICROSCOPE LEICA (MISCELLANEOUS) ×2 IMPLANT
DRAPE MICROSCOPE ZEISS OPMI (DRAPES) IMPLANT
DRAPE SURG 17X23 STRL (DRAPES) ×4 IMPLANT
DRESSING TELFA 8X3 (GAUZE/BANDAGES/DRESSINGS) ×2 IMPLANT
DRSG OPSITE POSTOP 3X4 (GAUZE/BANDAGES/DRESSINGS) ×2 IMPLANT
DURAPREP 26ML APPLICATOR (WOUND CARE) ×2 IMPLANT
ELECT REM PT RETURN 9FT ADLT (ELECTROSURGICAL) ×2
ELECTRODE REM PT RTRN 9FT ADLT (ELECTROSURGICAL) ×1 IMPLANT
GAUZE SPONGE 4X4 16PLY XRAY LF (GAUZE/BANDAGES/DRESSINGS) IMPLANT
GLOVE BIO SURGEON STRL SZ8 (GLOVE) ×2 IMPLANT
GLOVE BIOGEL PI IND STRL 7.0 (GLOVE) ×1 IMPLANT
GLOVE BIOGEL PI IND STRL 7.5 (GLOVE) ×1 IMPLANT
GLOVE BIOGEL PI IND STRL 8.5 (GLOVE) ×2 IMPLANT
GLOVE BIOGEL PI INDICATOR 7.0 (GLOVE) ×1
GLOVE BIOGEL PI INDICATOR 7.5 (GLOVE) ×1
GLOVE BIOGEL PI INDICATOR 8.5 (GLOVE) ×2
GLOVE ECLIPSE 7.5 STRL STRAW (GLOVE) IMPLANT
GLOVE ECLIPSE 8.0 STRL XLNG CF (GLOVE) ×2 IMPLANT
GLOVE SURG SS PI 7.0 STRL IVOR (GLOVE) ×4 IMPLANT
GLOVE SURG SS PI 8.0 STRL IVOR (GLOVE) ×4 IMPLANT
GOWN BRE IMP SLV AUR LG STRL (GOWN DISPOSABLE) ×2 IMPLANT
GOWN BRE IMP SLV AUR XL STRL (GOWN DISPOSABLE) ×4 IMPLANT
GOWN STRL REIN 2XL LVL4 (GOWN DISPOSABLE) ×2 IMPLANT
KIT BASIN OR (CUSTOM PROCEDURE TRAY) ×2 IMPLANT
KIT ROOM TURNOVER OR (KITS) ×2 IMPLANT
NEEDLE HYPO 22GX1.5 SAFETY (NEEDLE) ×2 IMPLANT
NEEDLE SPNL 22GX3.5 QUINCKE BK (NEEDLE) ×2 IMPLANT
NS IRRIG 1000ML POUR BTL (IV SOLUTION) ×2 IMPLANT
PACK LAMINECTOMY NEURO (CUSTOM PROCEDURE TRAY) ×2 IMPLANT
PAD ARMBOARD 7.5X6 YLW CONV (MISCELLANEOUS) ×10 IMPLANT
PATTIES SURGICAL .75X.75 (GAUZE/BANDAGES/DRESSINGS) ×2 IMPLANT
RUBBERBAND STERILE (MISCELLANEOUS) ×4 IMPLANT
SPONGE GAUZE 4X4 12PLY (GAUZE/BANDAGES/DRESSINGS) ×2 IMPLANT
SPONGE SURGIFOAM ABS GEL SZ50 (HEMOSTASIS) ×2 IMPLANT
STRIP CLOSURE SKIN 1/2X4 (GAUZE/BANDAGES/DRESSINGS) ×2 IMPLANT
SUT VIC AB 2-0 OS6 18 (SUTURE) ×8 IMPLANT
SUT VIC AB 3-0 CP2 18 (SUTURE) ×2 IMPLANT
SYR 20ML ECCENTRIC (SYRINGE) ×2 IMPLANT
TOWEL OR 17X24 6PK STRL BLUE (TOWEL DISPOSABLE) ×2 IMPLANT
TOWEL OR 17X26 10 PK STRL BLUE (TOWEL DISPOSABLE) ×2 IMPLANT
WATER STERILE IRR 1000ML POUR (IV SOLUTION) ×2 IMPLANT

## 2013-03-21 NOTE — Anesthesia Postprocedure Evaluation (Signed)
  Anesthesia Post-op Note  Patient: Vanessa Evans  Procedure(s) Performed: Procedure(s) with comments: Right Lumbar five-Sacral one Laminectomy for Synovial Cyst (Right) - right  Patient Location: PACU  Anesthesia Type:General  Level of Consciousness: awake, alert  and oriented  Airway and Oxygen Therapy: Patient Spontanous Breathing and Patient connected to nasal cannula oxygen  Post-op Pain: mild  Post-op Assessment: Post-op Vital signs reviewed  Post-op Vital Signs: Reviewed  Complications: No apparent anesthesia complications

## 2013-03-21 NOTE — H&P (Signed)
Vanessa Evans is an 67 y.o. female.   Chief Complaint: Right leg pain HPI: The patient is a 67 year old female who was evaluated in the office with radiating right lower extremity discomfort. She was tried on conservative therapy without improvement and underwent imaging study which showed a cyst of the ligamentum flavum and facet joint posteriorly putting pressure on the right S1 nerve root at L5-S1. After failing additional conservative therapy the patient requested surgery and now comes for a decompression at L 5 S1 with removal of her cyst. I've had a long discussion with her regarding the risks and benefits of surgical intervention. The risks discussed include but are not limited to bleeding infection weakness and paralysis spinal fluid leak coma and death. We've discussed alternative methods of therapy offered risks and benefits of nonintervention. She's had the opportunity to ask numerous questions and appears to understand. With this information in hand she has requested we proceed with surgery.  Past Medical History  Diagnosis Date  . PONV (postoperative nausea and vomiting)   . Dysrhythmia     tachycardia  . Heart murmur     aortic  . Shortness of breath     any exertion, cannot lie flat  . Hypertension   . Asthma     needs rescue inhaler few times a year  . Pneumonia     hx  . Sleep apnea     cannot tolerate cpap  . GERD (gastroesophageal reflux disease)   . Headache(784.0)   . Migraine   . Dizziness   . Arthritis     osteo    Past Surgical History  Procedure Laterality Date  . Oophorectomy    . Joint replacement Right     knee  . Breast surgery Right     lumpectomy  . Hernia repair    . Colonoscopy w/ biopsies      No family history on file. Social History:  reports that she has never smoked. She does not have any smokeless tobacco history on file. She reports that  drinks alcohol. She reports that she does not use illicit drugs.  Allergies:  Allergies   Allergen Reactions  . Codeine Shortness Of Breath and Itching    Bronchospasms and asthma attach  . Ultram (Tramadol) Itching and Other (See Comments)    Bronchospasm and asthma attach  . Nitrofuran Derivatives Diarrhea    Severe and long lasting  . Levaquin (Levofloxacin In D5w) Other (See Comments)    Sensation of being drunk, confused, caused a ruptured tendon in foot  . Ultracet (Tramadol-Acetaminophen) Itching  . Ciprofloxacin Other (See Comments)    Severe pain in neck and down back   . Nsaids Other (See Comments)    Bloody stools, abdominal pain, can tolerate naproxen in low doses.  . Sulfa Antibiotics Itching, Rash and Other (See Comments)    fever    Medications Prior to Admission  Medication Sig Dispense Refill  . acetaminophen (TYLENOL) 500 MG tablet Take 500 mg by mouth every 6 (six) hours as needed for pain.      Marland Kitchen albuterol (PROVENTIL HFA;VENTOLIN HFA) 108 (90 BASE) MCG/ACT inhaler Inhale 2 puffs into the lungs every 6 (six) hours as needed for wheezing.      Marland Kitchen aspirin EC 81 MG tablet Take 81 mg by mouth daily.      . benzonatate (TESSALON) 100 MG capsule Take 100-200 mg by mouth 3 (three) times daily as needed for cough.      Marland Kitchen  Calcium Carbonate-Vitamin D (CALCIUM 600 + D PO) Take 1 tablet by mouth daily.      . cholecalciferol (VITAMIN D) 1000 UNITS tablet Take 1,000 Units by mouth daily.      . Choline Fenofibrate (TRILIPIX) 135 MG capsule Take 135 mg by mouth daily.      . cyclobenzaprine (FLEXERIL) 10 MG tablet Take 10 mg by mouth 3 (three) times daily as needed for muscle spasms.      . diclofenac sodium (VOLTAREN) 1 % GEL Apply 2 g topically 4 (four) times daily.      Marland Kitchen diltiazem (DILACOR XR) 180 MG 24 hr capsule Take 180 mg by mouth daily.      . DiphenhydrAMINE HCl (BENADRYL ALLERGY PO) Take 2 tablets by mouth at bedtime as needed. For allergies      . docusate sodium (COLACE) 100 MG capsule Take 100-300 mg by mouth at bedtime as needed for constipation.       . fluticasone (FLONASE) 50 MCG/ACT nasal spray Place 2 sprays into the nose daily.      Marland Kitchen HYDROcodone-acetaminophen (NORCO/VICODIN) 5-325 MG per tablet Take 1 tablet by mouth at bedtime as needed for pain.      Marland Kitchen ibandronate (BONIVA) 150 MG tablet Take 150 mg by mouth every 30 (thirty) days. Take in the morning with a full glass of water, on an empty stomach, and do not take anything else by mouth or lie down for the next 30 min.      Marland Kitchen levocetirizine (XYZAL) 5 MG tablet Take 5 mg by mouth every evening.      . Liniments (BEN GAY EX) Apply 1 application topically 3 (three) times daily.      . Magnesium 250 MG TABS Take 1 tablet by mouth daily.      . Melatonin 5 MG TABS Take 1 tablet by mouth at bedtime as needed. For sleep      . Multiple Vitamins-Minerals (MULTIVITAMIN WITH MINERALS) tablet Take 1 tablet by mouth daily.      . naproxen (NAPROSYN) 250 MG tablet Take 250 mg by mouth every 8 (eight) hours as needed. For pain      . pantoprazole (PROTONIX) 40 MG tablet Take 40 mg by mouth daily.      Marland Kitchen tiZANidine (ZANAFLEX) 4 MG tablet Take 4 mg by mouth at bedtime.      . vitamin C (ASCORBIC ACID) 500 MG tablet Take 500 mg by mouth daily.      . vitamin E 400 UNIT capsule Take 400 Units by mouth daily.        No results found for this or any previous visit (from the past 48 hour(s)). No results found.  Review of systems not obtained due to patient factors.  Blood pressure 155/74, pulse 85, temperature 98 F (36.7 C), resp. rate 18, SpO2 97.00%.  The patient is awake alert and oriented. She is no facial asymmetry. Her gait is mildly antalgic. Her reflexes are decreased but equal. She does have normal strength and sensation. Assessment/Plan Impression is that of a posterior cyst L5-S1 with S1 nerve root compression. The plan is for an L5-S1 decompressive laminotomy with removal of the cyst and decompression of the S1 nerve root.  Reinaldo Meeker, MD 03/21/2013, 11:04 AM

## 2013-03-21 NOTE — Anesthesia Procedure Notes (Addendum)
Procedure Name: Intubation Date/Time: 03/21/2013 11:20 AM Performed by: Lovie Chol Pre-anesthesia Checklist: Patient identified, Emergency Drugs available, Suction available, Patient being monitored and Timeout performed Patient Re-evaluated:Patient Re-evaluated prior to inductionOxygen Delivery Method: Circle system utilized Preoxygenation: Pre-oxygenation with 100% oxygen Intubation Type: IV induction Ventilation: Mask ventilation without difficulty Laryngoscope Size: Miller and 2 Grade View: Grade III Tube type: Oral Tube size: 7.0 mm Number of attempts: 2 Airway Equipment and Method: Stylet Placement Confirmation: ETT inserted through vocal cords under direct vision,  positive ETCO2,  CO2 detector and breath sounds checked- equal and bilateral Secured at: 21 cm Tube secured with: Tape Dental Injury: Teeth and Oropharynx as per pre-operative assessment  Comments: Smooth IV induction. EZ mask. DL x 1 Mil 2 Grade 3 view. Esophageal intubation recognized immediately. Additional foam placed under patient's head, patient masked and DL x 1 Mil 2. Grade 3 view. AOI. +ETCO2 bbse.

## 2013-03-21 NOTE — Anesthesia Preprocedure Evaluation (Addendum)
Anesthesia Evaluation  Patient identified by MRN, date of birth, ID band Patient awake    Reviewed: Allergy & Precautions, H&P , NPO status , Patient's Chart, lab work & pertinent test results  History of Anesthesia Complications (+) PONV  Airway Mallampati: II TM Distance: >3 FB Neck ROM: Full    Dental  (+) Teeth Intact and Dental Advisory Given   Pulmonary shortness of breath and lying, asthma , sleep apnea , former smoker,  breath sounds clear to auscultation        Cardiovascular hypertension, Pt. on medications + dysrhythmias + Valvular Problems/Murmurs (Mild MR on echo.) MR Rhythm:Regular Rate:Normal     Neuro/Psych  Headaches,    GI/Hepatic Neg liver ROS, GERD-  Medicated and Controlled,  Endo/Other  negative endocrine ROS  Renal/GU negative Renal ROS     Musculoskeletal   Abdominal   Peds  Hematology negative hematology ROS (+)   Anesthesia Other Findings   Reproductive/Obstetrics negative OB ROS                          Anesthesia Physical Anesthesia Plan  ASA: II  Anesthesia Plan: General   Post-op Pain Management:    Induction: Intravenous  Airway Management Planned: Oral ETT  Additional Equipment:   Intra-op Plan:   Post-operative Plan: Extubation in OR  Informed Consent: I have reviewed the patients History and Physical, chart, labs and discussed the procedure including the risks, benefits and alternatives for the proposed anesthesia with the patient or authorized representative who has indicated his/her understanding and acceptance.   Dental advisory given  Plan Discussed with: CRNA, Anesthesiologist and Surgeon  Anesthesia Plan Comments:         Anesthesia Quick Evaluation

## 2013-03-21 NOTE — Op Note (Signed)
Preop diagnosis: Synovial cyst L5-S1 right with S1 nerve root compression Postop diagnosis: Same Procedure: Right L5-S1 intralaminar laminotomy for excision of synovial cyst Surgeon: Julies Carmickle Assistant: Venetia Maxon  After being placed the prone position the patient's back was prepped and draped in usual sterile fashion. Localizing x-rays taken prior to incision to identify the appropriate level. Midline incision was made above the spinous processes of L5 and S1. Using Bovie cutting current the incision was carried on the spinous processes. Subperiosteal dissection was then carried out on the right side the spinous processes and lamina subcutaneous tract was placed for exposure. X-ray showed approach the appropriate level. Using the high-speed drill the inferior one third of the L5 lamina the medial one third of the facet joint the superior one half of the S1 segment were removed. Ligamentum flavum was removed in a piecemeal fashion. Dissection laterally showed a cyst coming off the joint consistent with a synovial cyst. There is some bloody fluid within it and the cyst was removed in a piecemeal fashion gave excellent decompression visualization of the underlying S1 nerve root. The S1 nerve root was then followed for the right foramen until was completely decompressed and there is no evidence of residual cyst remaining. At this time inspection was carried out in all directions the evidence of residual compression and none could be identified. Irrigation was carried out and any bleeding control proper coagulation Gelfoam. The was then closed in multiple layers of Vicryl on the muscle subcutaneous subcuticular tissues. Dermabond Steri-Strips were placed on the skin. Shortness was then applied and the patient was extubated and taken to recovery in stable condition.

## 2013-03-21 NOTE — Preoperative (Signed)
Beta Blockers   Reason not to administer Beta Blockers:Not Applicable 

## 2013-03-21 NOTE — Transfer of Care (Signed)
Immediate Anesthesia Transfer of Care Note  Patient: Vanessa Evans  Procedure(s) Performed: Procedure(s) with comments: Right Lumbar five-Sacral one Laminectomy for Synovial Cyst (Right) - right  Patient Location: PACU  Anesthesia Type:General  Level of Consciousness: awake, alert , oriented and patient cooperative  Airway & Oxygen Therapy: Patient Spontanous Breathing and Patient connected to face mask oxygen  Post-op Assessment: Report given to PACU RN and Post -op Vital signs reviewed and stable  Post vital signs: Reviewed and stable  Complications: No apparent anesthesia complications

## 2013-03-22 MED ORDER — HYDROMORPHONE HCL 2 MG PO TABS: 2.0000 mg | ORAL_TABLET | ORAL | Status: DC | PRN

## 2013-03-22 NOTE — Discharge Summary (Signed)
Physician Discharge Summary  Patient ID: Vanessa Evans MRN: [id number] DOB/AGE: Dec 01, 1945 67 y.o.  Admit date: 03/21/2013 Discharge date: 03/22/2013  Admission Diagnoses:  Discharge Diagnoses:  Active Problems:   * No active hospital problems. *   Discharged Condition: good  Hospital Course: Surgery Tuesday for synovial cyst. Did well. No leg pain post op. Ambulated well. Home pod 1, specific instructions given.  Consults: None  Significant Diagnostic Studies: none  Treatments: surgery: L5 S1 lam for synovial cyst  Discharge Exam: Blood pressure 127/75, pulse 76, temperature 97.6 F (36.4 C), resp. rate 18, SpO2 92.00%. Incision/Wound:clean and dry  Disposition: Final discharge disposition not confirmed     Medication List    ASK your doctor about these medications       acetaminophen 500 MG tablet  Commonly known as:  TYLENOL  Take 500 mg by mouth every 6 (six) hours as needed for pain.     albuterol 108 (90 BASE) MCG/ACT inhaler  Commonly known as:  PROVENTIL HFA;VENTOLIN HFA  Inhale 2 puffs into the lungs every 6 (six) hours as needed for wheezing.     aspirin EC 81 MG tablet  Take 81 mg by mouth daily.     BEN GAY EX  Apply 1 application topically 3 (three) times daily.     BENADRYL ALLERGY PO  Take 2 tablets by mouth at bedtime as needed. For allergies     benzonatate 100 MG capsule  Commonly known as:  TESSALON  Take 100-200 mg by mouth 3 (three) times daily as needed for cough.     BONIVA 150 MG tablet  Generic drug:  ibandronate  Take 150 mg by mouth every 30 (thirty) days. Take in the morning with a full glass of water, on an empty stomach, and do not take anything else by mouth or lie down for the next 30 min.     CALCIUM 600 + D PO  Take 1 tablet by mouth daily.     cholecalciferol 1000 UNITS tablet  Commonly known as:  VITAMIN D  Take 1,000 Units by mouth daily.     cyclobenzaprine 10 MG tablet  Commonly known as:  FLEXERIL  Take  10 mg by mouth 3 (three) times daily as needed for muscle spasms.     diclofenac sodium 1 % Gel  Commonly known as:  VOLTAREN  Apply 2 g topically 4 (four) times daily.     diltiazem 180 MG 24 hr capsule  Commonly known as:  DILACOR XR  Take 180 mg by mouth daily.     docusate sodium 100 MG capsule  Commonly known as:  COLACE  Take 100-300 mg by mouth at bedtime as needed for constipation.     fluticasone 50 MCG/ACT nasal spray  Commonly known as:  FLONASE  Place 2 sprays into the nose daily.     HYDROcodone-acetaminophen 5-325 MG per tablet  Commonly known as:  NORCO/VICODIN  Take 1 tablet by mouth at bedtime as needed for pain.     levocetirizine 5 MG tablet  Commonly known as:  XYZAL  Take 5 mg by mouth every evening.     Magnesium 250 MG Tabs  Take 1 tablet by mouth daily.     Melatonin 5 MG Tabs  Take 1 tablet by mouth at bedtime as needed. For sleep     multivitamin with minerals tablet  Take 1 tablet by mouth daily.     naproxen 250 MG tablet  Commonly known as:  NAPROSYN  Take 250 mg by mouth every 8 (eight) hours as needed. For pain     pantoprazole 40 MG tablet  Commonly known as:  PROTONIX  Take 40 mg by mouth daily.     tiZANidine 4 MG tablet  Commonly known as:  ZANAFLEX  Take 4 mg by mouth at bedtime.     TRILIPIX 135 MG capsule  Generic drug:  Choline Fenofibrate  Take 135 mg by mouth daily.     vitamin C 500 MG tablet  Commonly known as:  ASCORBIC ACID  Take 500 mg by mouth daily.     vitamin E 400 UNIT capsule  Take 400 Units by mouth daily.         At home rest most of the time. Get up 9 or 10 times each day and take a 15 or 20 minute walk. No riding in the car and to your first postoperative appointment. If you have neck surgery you may shower from the chest down starting on the third postoperative day. If you had back surgery he may start showering on the third postoperative day with saran wrap wrapped around your incisional area 3  times. After the shower remove the saran wrap. Take pain medicine as needed and other medications as instructed. Call my office for an appointment.  SignedReinaldo Meeker, MD 03/22/2013, 8:44 AM

## 2013-03-22 NOTE — Progress Notes (Signed)
Pt doing very well. Pt given D/C instructions with Rx, verbal understanding given. All Pt's questions were answered prior to D/C. Pt D/C'd home via walking @ 1055 per MD order. Rema Fendt, RN

## 2013-03-22 NOTE — Progress Notes (Signed)
Pt doing well. Pt given D/C instructions with Rx, verbal understanding given. Pt D/C'd home via wheelchair @ 1110 with husband per MD order. Rema Fendt, RN

## 2013-03-23 ENCOUNTER — Encounter (HOSPITAL_COMMUNITY): Payer: Self-pay | Admitting: Neurosurgery

## 2013-04-17 DIAGNOSIS — R002 Palpitations: Secondary | ICD-10-CM | POA: Diagnosis not present

## 2013-04-17 DIAGNOSIS — E860 Dehydration: Secondary | ICD-10-CM | POA: Diagnosis not present

## 2013-04-17 DIAGNOSIS — K5730 Diverticulosis of large intestine without perforation or abscess without bleeding: Secondary | ICD-10-CM | POA: Diagnosis not present

## 2013-04-17 DIAGNOSIS — R197 Diarrhea, unspecified: Secondary | ICD-10-CM | POA: Diagnosis not present

## 2013-04-17 DIAGNOSIS — R5381 Other malaise: Secondary | ICD-10-CM | POA: Diagnosis not present

## 2013-04-17 DIAGNOSIS — R112 Nausea with vomiting, unspecified: Secondary | ICD-10-CM | POA: Diagnosis not present

## 2013-04-17 DIAGNOSIS — R110 Nausea: Secondary | ICD-10-CM | POA: Diagnosis not present

## 2013-04-17 DIAGNOSIS — R000 Tachycardia, unspecified: Secondary | ICD-10-CM | POA: Diagnosis not present

## 2013-04-17 DIAGNOSIS — R109 Unspecified abdominal pain: Secondary | ICD-10-CM | POA: Diagnosis not present

## 2013-04-19 DIAGNOSIS — M6281 Muscle weakness (generalized): Secondary | ICD-10-CM | POA: Diagnosis not present

## 2013-04-19 DIAGNOSIS — R262 Difficulty in walking, not elsewhere classified: Secondary | ICD-10-CM | POA: Diagnosis not present

## 2013-04-19 DIAGNOSIS — M7090 Unspecified soft tissue disorder related to use, overuse and pressure of unspecified site: Secondary | ICD-10-CM | POA: Diagnosis not present

## 2013-04-20 DIAGNOSIS — R1011 Right upper quadrant pain: Secondary | ICD-10-CM | POA: Diagnosis not present

## 2013-04-20 DIAGNOSIS — R110 Nausea: Secondary | ICD-10-CM | POA: Diagnosis not present

## 2013-04-21 DIAGNOSIS — M545 Low back pain, unspecified: Secondary | ICD-10-CM | POA: Diagnosis not present

## 2013-04-21 DIAGNOSIS — R262 Difficulty in walking, not elsewhere classified: Secondary | ICD-10-CM | POA: Diagnosis not present

## 2013-04-21 DIAGNOSIS — M6281 Muscle weakness (generalized): Secondary | ICD-10-CM | POA: Diagnosis not present

## 2013-04-24 DIAGNOSIS — M545 Low back pain, unspecified: Secondary | ICD-10-CM | POA: Diagnosis not present

## 2013-04-24 DIAGNOSIS — R262 Difficulty in walking, not elsewhere classified: Secondary | ICD-10-CM | POA: Diagnosis not present

## 2013-04-24 DIAGNOSIS — M6281 Muscle weakness (generalized): Secondary | ICD-10-CM | POA: Diagnosis not present

## 2013-04-27 DIAGNOSIS — J449 Chronic obstructive pulmonary disease, unspecified: Secondary | ICD-10-CM | POA: Diagnosis not present

## 2013-04-27 DIAGNOSIS — I10 Essential (primary) hypertension: Secondary | ICD-10-CM | POA: Diagnosis not present

## 2013-04-27 DIAGNOSIS — R5381 Other malaise: Secondary | ICD-10-CM | POA: Diagnosis not present

## 2013-04-27 DIAGNOSIS — R300 Dysuria: Secondary | ICD-10-CM | POA: Diagnosis not present

## 2013-04-27 DIAGNOSIS — R319 Hematuria, unspecified: Secondary | ICD-10-CM | POA: Diagnosis not present

## 2013-04-27 DIAGNOSIS — N390 Urinary tract infection, site not specified: Secondary | ICD-10-CM | POA: Diagnosis not present

## 2013-04-28 ENCOUNTER — Ambulatory Visit: Payer: Self-pay | Admitting: Internal Medicine

## 2013-04-28 DIAGNOSIS — R109 Unspecified abdominal pain: Secondary | ICD-10-CM | POA: Diagnosis not present

## 2013-04-28 DIAGNOSIS — R319 Hematuria, unspecified: Secondary | ICD-10-CM | POA: Diagnosis not present

## 2013-05-01 DIAGNOSIS — R262 Difficulty in walking, not elsewhere classified: Secondary | ICD-10-CM | POA: Diagnosis not present

## 2013-05-01 DIAGNOSIS — M545 Low back pain, unspecified: Secondary | ICD-10-CM | POA: Diagnosis not present

## 2013-05-01 DIAGNOSIS — M5126 Other intervertebral disc displacement, lumbar region: Secondary | ICD-10-CM | POA: Diagnosis not present

## 2013-05-01 DIAGNOSIS — J309 Allergic rhinitis, unspecified: Secondary | ICD-10-CM | POA: Diagnosis not present

## 2013-05-01 DIAGNOSIS — N390 Urinary tract infection, site not specified: Secondary | ICD-10-CM | POA: Diagnosis not present

## 2013-05-01 DIAGNOSIS — K5900 Constipation, unspecified: Secondary | ICD-10-CM | POA: Diagnosis not present

## 2013-05-01 DIAGNOSIS — R300 Dysuria: Secondary | ICD-10-CM | POA: Diagnosis not present

## 2013-05-01 DIAGNOSIS — M6281 Muscle weakness (generalized): Secondary | ICD-10-CM | POA: Diagnosis not present

## 2013-05-01 DIAGNOSIS — R7401 Elevation of levels of liver transaminase levels: Secondary | ICD-10-CM | POA: Diagnosis not present

## 2013-05-01 DIAGNOSIS — J449 Chronic obstructive pulmonary disease, unspecified: Secondary | ICD-10-CM | POA: Diagnosis not present

## 2013-05-03 DIAGNOSIS — M545 Low back pain, unspecified: Secondary | ICD-10-CM | POA: Diagnosis not present

## 2013-05-03 DIAGNOSIS — M6281 Muscle weakness (generalized): Secondary | ICD-10-CM | POA: Diagnosis not present

## 2013-05-03 DIAGNOSIS — R262 Difficulty in walking, not elsewhere classified: Secondary | ICD-10-CM | POA: Diagnosis not present

## 2013-05-09 DIAGNOSIS — M6281 Muscle weakness (generalized): Secondary | ICD-10-CM | POA: Diagnosis not present

## 2013-05-09 DIAGNOSIS — M545 Low back pain, unspecified: Secondary | ICD-10-CM | POA: Diagnosis not present

## 2013-05-09 DIAGNOSIS — R262 Difficulty in walking, not elsewhere classified: Secondary | ICD-10-CM | POA: Diagnosis not present

## 2013-05-11 DIAGNOSIS — M545 Low back pain, unspecified: Secondary | ICD-10-CM | POA: Diagnosis not present

## 2013-05-11 DIAGNOSIS — R262 Difficulty in walking, not elsewhere classified: Secondary | ICD-10-CM | POA: Diagnosis not present

## 2013-05-11 DIAGNOSIS — M6281 Muscle weakness (generalized): Secondary | ICD-10-CM | POA: Diagnosis not present

## 2013-05-12 DIAGNOSIS — J309 Allergic rhinitis, unspecified: Secondary | ICD-10-CM | POA: Diagnosis not present

## 2013-05-15 DIAGNOSIS — M545 Low back pain, unspecified: Secondary | ICD-10-CM | POA: Diagnosis not present

## 2013-05-15 DIAGNOSIS — R262 Difficulty in walking, not elsewhere classified: Secondary | ICD-10-CM | POA: Diagnosis not present

## 2013-05-15 DIAGNOSIS — M6281 Muscle weakness (generalized): Secondary | ICD-10-CM | POA: Diagnosis not present

## 2013-05-23 DIAGNOSIS — I10 Essential (primary) hypertension: Secondary | ICD-10-CM | POA: Diagnosis not present

## 2013-05-23 DIAGNOSIS — R300 Dysuria: Secondary | ICD-10-CM | POA: Diagnosis not present

## 2013-05-23 DIAGNOSIS — J309 Allergic rhinitis, unspecified: Secondary | ICD-10-CM | POA: Diagnosis not present

## 2013-05-23 DIAGNOSIS — N390 Urinary tract infection, site not specified: Secondary | ICD-10-CM | POA: Diagnosis not present

## 2013-05-23 DIAGNOSIS — N3946 Mixed incontinence: Secondary | ICD-10-CM | POA: Diagnosis not present

## 2013-05-23 DIAGNOSIS — J45909 Unspecified asthma, uncomplicated: Secondary | ICD-10-CM | POA: Diagnosis not present

## 2013-05-26 DIAGNOSIS — L821 Other seborrheic keratosis: Secondary | ICD-10-CM | POA: Diagnosis not present

## 2013-05-26 DIAGNOSIS — L570 Actinic keratosis: Secondary | ICD-10-CM | POA: Diagnosis not present

## 2013-05-26 DIAGNOSIS — J309 Allergic rhinitis, unspecified: Secondary | ICD-10-CM | POA: Diagnosis not present

## 2013-05-26 DIAGNOSIS — D485 Neoplasm of uncertain behavior of skin: Secondary | ICD-10-CM | POA: Diagnosis not present

## 2013-06-05 DIAGNOSIS — J309 Allergic rhinitis, unspecified: Secondary | ICD-10-CM | POA: Diagnosis not present

## 2013-06-05 DIAGNOSIS — N390 Urinary tract infection, site not specified: Secondary | ICD-10-CM | POA: Diagnosis not present

## 2013-06-05 DIAGNOSIS — I10 Essential (primary) hypertension: Secondary | ICD-10-CM | POA: Diagnosis not present

## 2013-06-05 DIAGNOSIS — J45909 Unspecified asthma, uncomplicated: Secondary | ICD-10-CM | POA: Diagnosis not present

## 2013-06-05 DIAGNOSIS — Z23 Encounter for immunization: Secondary | ICD-10-CM | POA: Diagnosis not present

## 2013-06-05 DIAGNOSIS — R229 Localized swelling, mass and lump, unspecified: Secondary | ICD-10-CM | POA: Diagnosis not present

## 2013-06-12 DIAGNOSIS — J309 Allergic rhinitis, unspecified: Secondary | ICD-10-CM | POA: Diagnosis not present

## 2013-06-12 DIAGNOSIS — R229 Localized swelling, mass and lump, unspecified: Secondary | ICD-10-CM | POA: Diagnosis not present

## 2013-06-23 ENCOUNTER — Ambulatory Visit: Payer: Self-pay | Admitting: Internal Medicine

## 2013-06-23 DIAGNOSIS — R9389 Abnormal findings on diagnostic imaging of other specified body structures: Secondary | ICD-10-CM | POA: Diagnosis not present

## 2013-06-23 DIAGNOSIS — E042 Nontoxic multinodular goiter: Secondary | ICD-10-CM | POA: Diagnosis not present

## 2013-06-23 DIAGNOSIS — E041 Nontoxic single thyroid nodule: Secondary | ICD-10-CM | POA: Diagnosis not present

## 2013-06-27 DIAGNOSIS — D485 Neoplasm of uncertain behavior of skin: Secondary | ICD-10-CM | POA: Diagnosis not present

## 2013-06-27 DIAGNOSIS — J309 Allergic rhinitis, unspecified: Secondary | ICD-10-CM | POA: Diagnosis not present

## 2013-06-29 DIAGNOSIS — E782 Mixed hyperlipidemia: Secondary | ICD-10-CM | POA: Diagnosis not present

## 2013-06-29 DIAGNOSIS — R7301 Impaired fasting glucose: Secondary | ICD-10-CM | POA: Diagnosis not present

## 2013-06-29 DIAGNOSIS — D649 Anemia, unspecified: Secondary | ICD-10-CM | POA: Diagnosis not present

## 2013-06-29 DIAGNOSIS — R7401 Elevation of levels of liver transaminase levels: Secondary | ICD-10-CM | POA: Diagnosis not present

## 2013-07-11 DIAGNOSIS — R229 Localized swelling, mass and lump, unspecified: Secondary | ICD-10-CM | POA: Diagnosis not present

## 2013-07-11 DIAGNOSIS — R7301 Impaired fasting glucose: Secondary | ICD-10-CM | POA: Diagnosis not present

## 2013-07-11 DIAGNOSIS — R7401 Elevation of levels of liver transaminase levels: Secondary | ICD-10-CM | POA: Diagnosis not present

## 2013-07-11 DIAGNOSIS — E782 Mixed hyperlipidemia: Secondary | ICD-10-CM | POA: Diagnosis not present

## 2013-07-11 DIAGNOSIS — J309 Allergic rhinitis, unspecified: Secondary | ICD-10-CM | POA: Diagnosis not present

## 2013-07-11 DIAGNOSIS — I10 Essential (primary) hypertension: Secondary | ICD-10-CM | POA: Diagnosis not present

## 2013-07-11 DIAGNOSIS — K5900 Constipation, unspecified: Secondary | ICD-10-CM | POA: Diagnosis not present

## 2013-07-11 DIAGNOSIS — R300 Dysuria: Secondary | ICD-10-CM | POA: Diagnosis not present

## 2013-07-11 DIAGNOSIS — J449 Chronic obstructive pulmonary disease, unspecified: Secondary | ICD-10-CM | POA: Diagnosis not present

## 2013-07-20 DIAGNOSIS — I119 Hypertensive heart disease without heart failure: Secondary | ICD-10-CM | POA: Diagnosis not present

## 2013-07-20 DIAGNOSIS — I471 Supraventricular tachycardia: Secondary | ICD-10-CM | POA: Diagnosis not present

## 2013-07-20 DIAGNOSIS — I4949 Other premature depolarization: Secondary | ICD-10-CM | POA: Diagnosis not present

## 2013-07-20 DIAGNOSIS — E782 Mixed hyperlipidemia: Secondary | ICD-10-CM | POA: Diagnosis not present

## 2013-07-21 DIAGNOSIS — N390 Urinary tract infection, site not specified: Secondary | ICD-10-CM | POA: Diagnosis not present

## 2013-07-21 DIAGNOSIS — I10 Essential (primary) hypertension: Secondary | ICD-10-CM | POA: Diagnosis not present

## 2013-07-21 DIAGNOSIS — J309 Allergic rhinitis, unspecified: Secondary | ICD-10-CM | POA: Diagnosis not present

## 2013-07-21 DIAGNOSIS — K219 Gastro-esophageal reflux disease without esophagitis: Secondary | ICD-10-CM | POA: Diagnosis not present

## 2013-07-21 DIAGNOSIS — R319 Hematuria, unspecified: Secondary | ICD-10-CM | POA: Diagnosis not present

## 2013-07-21 DIAGNOSIS — R300 Dysuria: Secondary | ICD-10-CM | POA: Diagnosis not present

## 2013-07-24 DIAGNOSIS — J309 Allergic rhinitis, unspecified: Secondary | ICD-10-CM | POA: Diagnosis not present

## 2013-07-24 DIAGNOSIS — J449 Chronic obstructive pulmonary disease, unspecified: Secondary | ICD-10-CM | POA: Diagnosis not present

## 2013-07-24 DIAGNOSIS — R062 Wheezing: Secondary | ICD-10-CM | POA: Diagnosis not present

## 2013-08-07 DIAGNOSIS — J309 Allergic rhinitis, unspecified: Secondary | ICD-10-CM | POA: Diagnosis not present

## 2013-08-07 DIAGNOSIS — K219 Gastro-esophageal reflux disease without esophagitis: Secondary | ICD-10-CM | POA: Diagnosis not present

## 2013-08-07 DIAGNOSIS — R319 Hematuria, unspecified: Secondary | ICD-10-CM | POA: Diagnosis not present

## 2013-08-07 DIAGNOSIS — N390 Urinary tract infection, site not specified: Secondary | ICD-10-CM | POA: Diagnosis not present

## 2013-08-07 DIAGNOSIS — R300 Dysuria: Secondary | ICD-10-CM | POA: Diagnosis not present

## 2013-08-07 DIAGNOSIS — J449 Chronic obstructive pulmonary disease, unspecified: Secondary | ICD-10-CM | POA: Diagnosis not present

## 2013-08-07 DIAGNOSIS — I10 Essential (primary) hypertension: Secondary | ICD-10-CM | POA: Diagnosis not present

## 2013-08-18 DIAGNOSIS — J309 Allergic rhinitis, unspecified: Secondary | ICD-10-CM | POA: Diagnosis not present

## 2013-08-29 DIAGNOSIS — N390 Urinary tract infection, site not specified: Secondary | ICD-10-CM | POA: Diagnosis not present

## 2013-08-29 DIAGNOSIS — R350 Frequency of micturition: Secondary | ICD-10-CM | POA: Diagnosis not present

## 2013-08-29 DIAGNOSIS — R300 Dysuria: Secondary | ICD-10-CM | POA: Diagnosis not present

## 2013-09-01 DIAGNOSIS — J309 Allergic rhinitis, unspecified: Secondary | ICD-10-CM | POA: Diagnosis not present

## 2013-09-08 DIAGNOSIS — J45909 Unspecified asthma, uncomplicated: Secondary | ICD-10-CM | POA: Diagnosis not present

## 2013-09-08 DIAGNOSIS — N390 Urinary tract infection, site not specified: Secondary | ICD-10-CM | POA: Diagnosis not present

## 2013-09-08 DIAGNOSIS — J309 Allergic rhinitis, unspecified: Secondary | ICD-10-CM | POA: Diagnosis not present

## 2013-09-08 DIAGNOSIS — N3020 Other chronic cystitis without hematuria: Secondary | ICD-10-CM | POA: Diagnosis not present

## 2013-09-13 DIAGNOSIS — J449 Chronic obstructive pulmonary disease, unspecified: Secondary | ICD-10-CM | POA: Diagnosis not present

## 2013-09-13 DIAGNOSIS — J309 Allergic rhinitis, unspecified: Secondary | ICD-10-CM | POA: Diagnosis not present

## 2013-09-13 DIAGNOSIS — R300 Dysuria: Secondary | ICD-10-CM | POA: Diagnosis not present

## 2013-09-13 DIAGNOSIS — N3020 Other chronic cystitis without hematuria: Secondary | ICD-10-CM | POA: Diagnosis not present

## 2013-09-13 DIAGNOSIS — J069 Acute upper respiratory infection, unspecified: Secondary | ICD-10-CM | POA: Diagnosis not present

## 2013-09-22 DIAGNOSIS — K219 Gastro-esophageal reflux disease without esophagitis: Secondary | ICD-10-CM | POA: Diagnosis not present

## 2013-09-22 DIAGNOSIS — M1610 Unilateral primary osteoarthritis, unspecified hip: Secondary | ICD-10-CM | POA: Diagnosis not present

## 2013-09-22 DIAGNOSIS — R062 Wheezing: Secondary | ICD-10-CM | POA: Diagnosis not present

## 2013-09-22 DIAGNOSIS — I10 Essential (primary) hypertension: Secondary | ICD-10-CM | POA: Diagnosis not present

## 2013-09-22 DIAGNOSIS — J309 Allergic rhinitis, unspecified: Secondary | ICD-10-CM | POA: Diagnosis not present

## 2013-09-22 DIAGNOSIS — J441 Chronic obstructive pulmonary disease with (acute) exacerbation: Secondary | ICD-10-CM | POA: Diagnosis not present

## 2013-09-22 DIAGNOSIS — R0602 Shortness of breath: Secondary | ICD-10-CM | POA: Diagnosis not present

## 2013-09-22 DIAGNOSIS — M169 Osteoarthritis of hip, unspecified: Secondary | ICD-10-CM | POA: Diagnosis not present

## 2013-09-23 DIAGNOSIS — B9789 Other viral agents as the cause of diseases classified elsewhere: Secondary | ICD-10-CM | POA: Diagnosis not present

## 2013-09-25 DIAGNOSIS — N3020 Other chronic cystitis without hematuria: Secondary | ICD-10-CM | POA: Diagnosis not present

## 2013-09-25 DIAGNOSIS — N390 Urinary tract infection, site not specified: Secondary | ICD-10-CM | POA: Diagnosis not present

## 2013-09-25 DIAGNOSIS — R0602 Shortness of breath: Secondary | ICD-10-CM | POA: Diagnosis not present

## 2013-09-25 DIAGNOSIS — R300 Dysuria: Secondary | ICD-10-CM | POA: Diagnosis not present

## 2013-09-25 DIAGNOSIS — J441 Chronic obstructive pulmonary disease with (acute) exacerbation: Secondary | ICD-10-CM | POA: Diagnosis not present

## 2013-09-29 DIAGNOSIS — J449 Chronic obstructive pulmonary disease, unspecified: Secondary | ICD-10-CM | POA: Diagnosis not present

## 2013-09-29 DIAGNOSIS — N3020 Other chronic cystitis without hematuria: Secondary | ICD-10-CM | POA: Diagnosis not present

## 2013-09-29 DIAGNOSIS — J111 Influenza due to unidentified influenza virus with other respiratory manifestations: Secondary | ICD-10-CM | POA: Diagnosis not present

## 2013-10-09 DIAGNOSIS — J449 Chronic obstructive pulmonary disease, unspecified: Secondary | ICD-10-CM | POA: Diagnosis not present

## 2013-10-09 DIAGNOSIS — J309 Allergic rhinitis, unspecified: Secondary | ICD-10-CM | POA: Diagnosis not present

## 2013-10-09 DIAGNOSIS — R300 Dysuria: Secondary | ICD-10-CM | POA: Diagnosis not present

## 2013-10-09 DIAGNOSIS — N3020 Other chronic cystitis without hematuria: Secondary | ICD-10-CM | POA: Diagnosis not present

## 2013-10-27 DIAGNOSIS — J309 Allergic rhinitis, unspecified: Secondary | ICD-10-CM | POA: Diagnosis not present

## 2013-11-10 DIAGNOSIS — R7402 Elevation of levels of lactic acid dehydrogenase (LDH): Secondary | ICD-10-CM | POA: Diagnosis not present

## 2013-11-10 DIAGNOSIS — E782 Mixed hyperlipidemia: Secondary | ICD-10-CM | POA: Diagnosis not present

## 2013-11-10 DIAGNOSIS — R7989 Other specified abnormal findings of blood chemistry: Secondary | ICD-10-CM | POA: Diagnosis not present

## 2013-11-10 DIAGNOSIS — R7401 Elevation of levels of liver transaminase levels: Secondary | ICD-10-CM | POA: Diagnosis not present

## 2013-11-10 DIAGNOSIS — E039 Hypothyroidism, unspecified: Secondary | ICD-10-CM | POA: Diagnosis not present

## 2013-11-10 DIAGNOSIS — R7301 Impaired fasting glucose: Secondary | ICD-10-CM | POA: Diagnosis not present

## 2013-11-13 DIAGNOSIS — J309 Allergic rhinitis, unspecified: Secondary | ICD-10-CM | POA: Diagnosis not present

## 2013-11-27 DIAGNOSIS — I10 Essential (primary) hypertension: Secondary | ICD-10-CM | POA: Diagnosis not present

## 2013-11-27 DIAGNOSIS — J449 Chronic obstructive pulmonary disease, unspecified: Secondary | ICD-10-CM | POA: Diagnosis not present

## 2013-11-27 DIAGNOSIS — R42 Dizziness and giddiness: Secondary | ICD-10-CM | POA: Diagnosis not present

## 2013-11-27 DIAGNOSIS — R946 Abnormal results of thyroid function studies: Secondary | ICD-10-CM | POA: Diagnosis not present

## 2013-11-27 DIAGNOSIS — J309 Allergic rhinitis, unspecified: Secondary | ICD-10-CM | POA: Diagnosis not present

## 2013-12-11 DIAGNOSIS — R42 Dizziness and giddiness: Secondary | ICD-10-CM | POA: Diagnosis not present

## 2013-12-11 DIAGNOSIS — R413 Other amnesia: Secondary | ICD-10-CM | POA: Diagnosis not present

## 2013-12-11 DIAGNOSIS — J309 Allergic rhinitis, unspecified: Secondary | ICD-10-CM | POA: Diagnosis not present

## 2013-12-14 DIAGNOSIS — N318 Other neuromuscular dysfunction of bladder: Secondary | ICD-10-CM | POA: Diagnosis not present

## 2013-12-14 DIAGNOSIS — N393 Stress incontinence (female) (male): Secondary | ICD-10-CM | POA: Insufficient documentation

## 2013-12-14 DIAGNOSIS — R339 Retention of urine, unspecified: Secondary | ICD-10-CM | POA: Insufficient documentation

## 2013-12-14 DIAGNOSIS — R310 Gross hematuria: Secondary | ICD-10-CM | POA: Insufficient documentation

## 2013-12-14 DIAGNOSIS — N3020 Other chronic cystitis without hematuria: Secondary | ICD-10-CM | POA: Diagnosis not present

## 2013-12-19 DIAGNOSIS — R339 Retention of urine, unspecified: Secondary | ICD-10-CM | POA: Diagnosis not present

## 2013-12-19 DIAGNOSIS — R310 Gross hematuria: Secondary | ICD-10-CM | POA: Diagnosis not present

## 2013-12-21 DIAGNOSIS — E041 Nontoxic single thyroid nodule: Secondary | ICD-10-CM | POA: Diagnosis not present

## 2013-12-21 DIAGNOSIS — J309 Allergic rhinitis, unspecified: Secondary | ICD-10-CM | POA: Diagnosis not present

## 2013-12-25 DIAGNOSIS — R300 Dysuria: Secondary | ICD-10-CM | POA: Diagnosis not present

## 2013-12-25 DIAGNOSIS — N390 Urinary tract infection, site not specified: Secondary | ICD-10-CM | POA: Diagnosis not present

## 2013-12-25 DIAGNOSIS — J45909 Unspecified asthma, uncomplicated: Secondary | ICD-10-CM | POA: Diagnosis not present

## 2013-12-25 DIAGNOSIS — J309 Allergic rhinitis, unspecified: Secondary | ICD-10-CM | POA: Diagnosis not present

## 2013-12-25 DIAGNOSIS — I10 Essential (primary) hypertension: Secondary | ICD-10-CM | POA: Diagnosis not present

## 2013-12-25 DIAGNOSIS — K219 Gastro-esophageal reflux disease without esophagitis: Secondary | ICD-10-CM | POA: Diagnosis not present

## 2013-12-31 DIAGNOSIS — J309 Allergic rhinitis, unspecified: Secondary | ICD-10-CM | POA: Diagnosis not present

## 2014-01-05 DIAGNOSIS — J309 Allergic rhinitis, unspecified: Secondary | ICD-10-CM | POA: Diagnosis not present

## 2014-01-22 DIAGNOSIS — E785 Hyperlipidemia, unspecified: Secondary | ICD-10-CM | POA: Diagnosis not present

## 2014-01-22 DIAGNOSIS — R000 Tachycardia, unspecified: Secondary | ICD-10-CM | POA: Diagnosis not present

## 2014-01-22 DIAGNOSIS — R0789 Other chest pain: Secondary | ICD-10-CM | POA: Diagnosis not present

## 2014-01-22 DIAGNOSIS — J309 Allergic rhinitis, unspecified: Secondary | ICD-10-CM | POA: Diagnosis not present

## 2014-02-05 DIAGNOSIS — J45909 Unspecified asthma, uncomplicated: Secondary | ICD-10-CM | POA: Insufficient documentation

## 2014-02-05 DIAGNOSIS — R079 Chest pain, unspecified: Secondary | ICD-10-CM | POA: Insufficient documentation

## 2014-02-05 DIAGNOSIS — R310 Gross hematuria: Secondary | ICD-10-CM | POA: Diagnosis not present

## 2014-02-05 DIAGNOSIS — R350 Frequency of micturition: Secondary | ICD-10-CM | POA: Insufficient documentation

## 2014-02-05 DIAGNOSIS — R000 Tachycardia, unspecified: Secondary | ICD-10-CM | POA: Insufficient documentation

## 2014-02-05 DIAGNOSIS — J309 Allergic rhinitis, unspecified: Secondary | ICD-10-CM | POA: Diagnosis not present

## 2014-02-05 DIAGNOSIS — N3020 Other chronic cystitis without hematuria: Secondary | ICD-10-CM | POA: Diagnosis not present

## 2014-02-19 ENCOUNTER — Encounter: Payer: Self-pay | Admitting: Neurology

## 2014-02-19 DIAGNOSIS — J441 Chronic obstructive pulmonary disease with (acute) exacerbation: Secondary | ICD-10-CM | POA: Diagnosis not present

## 2014-02-19 DIAGNOSIS — IMO0001 Reserved for inherently not codable concepts without codable children: Secondary | ICD-10-CM | POA: Diagnosis not present

## 2014-02-19 DIAGNOSIS — J449 Chronic obstructive pulmonary disease, unspecified: Secondary | ICD-10-CM | POA: Diagnosis not present

## 2014-02-19 DIAGNOSIS — R42 Dizziness and giddiness: Secondary | ICD-10-CM | POA: Diagnosis not present

## 2014-02-19 DIAGNOSIS — J8410 Pulmonary fibrosis, unspecified: Secondary | ICD-10-CM | POA: Diagnosis not present

## 2014-02-24 DIAGNOSIS — R569 Unspecified convulsions: Secondary | ICD-10-CM | POA: Diagnosis not present

## 2014-03-05 DIAGNOSIS — J449 Chronic obstructive pulmonary disease, unspecified: Secondary | ICD-10-CM | POA: Diagnosis not present

## 2014-03-05 DIAGNOSIS — J309 Allergic rhinitis, unspecified: Secondary | ICD-10-CM | POA: Diagnosis not present

## 2014-03-05 DIAGNOSIS — M75100 Unspecified rotator cuff tear or rupture of unspecified shoulder, not specified as traumatic: Secondary | ICD-10-CM | POA: Diagnosis not present

## 2014-03-05 DIAGNOSIS — IMO0002 Reserved for concepts with insufficient information to code with codable children: Secondary | ICD-10-CM | POA: Diagnosis not present

## 2014-03-05 DIAGNOSIS — M545 Low back pain, unspecified: Secondary | ICD-10-CM | POA: Diagnosis not present

## 2014-03-05 DIAGNOSIS — M1610 Unilateral primary osteoarthritis, unspecified hip: Secondary | ICD-10-CM | POA: Diagnosis not present

## 2014-03-05 DIAGNOSIS — M159 Polyosteoarthritis, unspecified: Secondary | ICD-10-CM | POA: Diagnosis not present

## 2014-03-05 DIAGNOSIS — M169 Osteoarthritis of hip, unspecified: Secondary | ICD-10-CM | POA: Diagnosis not present

## 2014-03-05 DIAGNOSIS — N3020 Other chronic cystitis without hematuria: Secondary | ICD-10-CM | POA: Diagnosis not present

## 2014-03-10 ENCOUNTER — Encounter: Payer: Self-pay | Admitting: Neurology

## 2014-03-10 DIAGNOSIS — IMO0001 Reserved for inherently not codable concepts without codable children: Secondary | ICD-10-CM | POA: Diagnosis not present

## 2014-03-10 DIAGNOSIS — R42 Dizziness and giddiness: Secondary | ICD-10-CM | POA: Diagnosis not present

## 2014-03-19 DIAGNOSIS — J309 Allergic rhinitis, unspecified: Secondary | ICD-10-CM | POA: Diagnosis not present

## 2014-03-30 ENCOUNTER — Ambulatory Visit: Payer: Self-pay | Admitting: Internal Medicine

## 2014-03-30 DIAGNOSIS — D179 Benign lipomatous neoplasm, unspecified: Secondary | ICD-10-CM | POA: Diagnosis not present

## 2014-03-30 DIAGNOSIS — M6789 Other specified disorders of synovium and tendon, multiple sites: Secondary | ICD-10-CM | POA: Diagnosis not present

## 2014-03-30 DIAGNOSIS — S46819A Strain of other muscles, fascia and tendons at shoulder and upper arm level, unspecified arm, initial encounter: Secondary | ICD-10-CM | POA: Diagnosis not present

## 2014-03-30 DIAGNOSIS — M25519 Pain in unspecified shoulder: Secondary | ICD-10-CM | POA: Diagnosis not present

## 2014-03-30 DIAGNOSIS — M7530 Calcific tendinitis of unspecified shoulder: Secondary | ICD-10-CM | POA: Diagnosis not present

## 2014-03-30 DIAGNOSIS — M6250 Muscle wasting and atrophy, not elsewhere classified, unspecified site: Secondary | ICD-10-CM | POA: Diagnosis not present

## 2014-04-02 DIAGNOSIS — M159 Polyosteoarthritis, unspecified: Secondary | ICD-10-CM | POA: Diagnosis not present

## 2014-04-02 DIAGNOSIS — IMO0002 Reserved for concepts with insufficient information to code with codable children: Secondary | ICD-10-CM | POA: Diagnosis not present

## 2014-04-02 DIAGNOSIS — M75100 Unspecified rotator cuff tear or rupture of unspecified shoulder, not specified as traumatic: Secondary | ICD-10-CM | POA: Diagnosis not present

## 2014-04-02 DIAGNOSIS — J449 Chronic obstructive pulmonary disease, unspecified: Secondary | ICD-10-CM | POA: Diagnosis not present

## 2014-04-02 DIAGNOSIS — J309 Allergic rhinitis, unspecified: Secondary | ICD-10-CM | POA: Diagnosis not present

## 2014-04-02 DIAGNOSIS — I10 Essential (primary) hypertension: Secondary | ICD-10-CM | POA: Diagnosis not present

## 2014-04-10 ENCOUNTER — Encounter: Payer: Self-pay | Admitting: Neurology

## 2014-04-17 DIAGNOSIS — R21 Rash and other nonspecific skin eruption: Secondary | ICD-10-CM | POA: Diagnosis not present

## 2014-04-17 DIAGNOSIS — T887XXA Unspecified adverse effect of drug or medicament, initial encounter: Secondary | ICD-10-CM | POA: Diagnosis not present

## 2014-04-17 DIAGNOSIS — J309 Allergic rhinitis, unspecified: Secondary | ICD-10-CM | POA: Diagnosis not present

## 2014-04-17 DIAGNOSIS — M75100 Unspecified rotator cuff tear or rupture of unspecified shoulder, not specified as traumatic: Secondary | ICD-10-CM | POA: Diagnosis not present

## 2014-04-17 DIAGNOSIS — S46819A Strain of other muscles, fascia and tendons at shoulder and upper arm level, unspecified arm, initial encounter: Secondary | ICD-10-CM | POA: Diagnosis not present

## 2014-04-17 DIAGNOSIS — IMO0002 Reserved for concepts with insufficient information to code with codable children: Secondary | ICD-10-CM | POA: Diagnosis not present

## 2014-04-18 DIAGNOSIS — R21 Rash and other nonspecific skin eruption: Secondary | ICD-10-CM | POA: Diagnosis not present

## 2014-04-30 DIAGNOSIS — J309 Allergic rhinitis, unspecified: Secondary | ICD-10-CM | POA: Diagnosis not present

## 2014-05-01 DIAGNOSIS — H2510 Age-related nuclear cataract, unspecified eye: Secondary | ICD-10-CM | POA: Diagnosis not present

## 2014-05-02 DIAGNOSIS — Z86010 Personal history of colonic polyps: Secondary | ICD-10-CM | POA: Diagnosis not present

## 2014-05-02 DIAGNOSIS — R197 Diarrhea, unspecified: Secondary | ICD-10-CM | POA: Diagnosis not present

## 2014-05-03 ENCOUNTER — Other Ambulatory Visit: Payer: Self-pay | Admitting: Unknown Physician Specialty

## 2014-05-03 DIAGNOSIS — R197 Diarrhea, unspecified: Secondary | ICD-10-CM | POA: Diagnosis not present

## 2014-05-03 LAB — CLOSTRIDIUM DIFFICILE(ARMC)

## 2014-05-06 LAB — STOOL CULTURE

## 2014-05-14 DIAGNOSIS — J3089 Other allergic rhinitis: Secondary | ICD-10-CM | POA: Diagnosis not present

## 2014-05-14 DIAGNOSIS — J301 Allergic rhinitis due to pollen: Secondary | ICD-10-CM | POA: Diagnosis not present

## 2014-05-16 DIAGNOSIS — M75121 Complete rotator cuff tear or rupture of right shoulder, not specified as traumatic: Secondary | ICD-10-CM | POA: Diagnosis not present

## 2014-05-16 DIAGNOSIS — M7542 Impingement syndrome of left shoulder: Secondary | ICD-10-CM | POA: Diagnosis not present

## 2014-05-24 DIAGNOSIS — M75121 Complete rotator cuff tear or rupture of right shoulder, not specified as traumatic: Secondary | ICD-10-CM | POA: Diagnosis not present

## 2014-05-28 DIAGNOSIS — J301 Allergic rhinitis due to pollen: Secondary | ICD-10-CM | POA: Diagnosis not present

## 2014-05-28 DIAGNOSIS — M75121 Complete rotator cuff tear or rupture of right shoulder, not specified as traumatic: Secondary | ICD-10-CM | POA: Diagnosis not present

## 2014-05-28 DIAGNOSIS — J3089 Other allergic rhinitis: Secondary | ICD-10-CM | POA: Diagnosis not present

## 2014-05-29 DIAGNOSIS — N3020 Other chronic cystitis without hematuria: Secondary | ICD-10-CM | POA: Diagnosis not present

## 2014-05-29 DIAGNOSIS — R350 Frequency of micturition: Secondary | ICD-10-CM | POA: Diagnosis not present

## 2014-05-29 DIAGNOSIS — Z860100 Personal history of colon polyps, unspecified: Secondary | ICD-10-CM | POA: Insufficient documentation

## 2014-05-29 DIAGNOSIS — R339 Retention of urine, unspecified: Secondary | ICD-10-CM | POA: Diagnosis not present

## 2014-05-29 DIAGNOSIS — R310 Gross hematuria: Secondary | ICD-10-CM | POA: Diagnosis not present

## 2014-05-31 DIAGNOSIS — M75121 Complete rotator cuff tear or rupture of right shoulder, not specified as traumatic: Secondary | ICD-10-CM | POA: Diagnosis not present

## 2014-06-01 DIAGNOSIS — Z23 Encounter for immunization: Secondary | ICD-10-CM | POA: Diagnosis not present

## 2014-06-05 DIAGNOSIS — M75121 Complete rotator cuff tear or rupture of right shoulder, not specified as traumatic: Secondary | ICD-10-CM | POA: Diagnosis not present

## 2014-06-08 ENCOUNTER — Ambulatory Visit: Payer: Self-pay | Admitting: Unknown Physician Specialty

## 2014-06-08 DIAGNOSIS — M810 Age-related osteoporosis without current pathological fracture: Secondary | ICD-10-CM | POA: Diagnosis not present

## 2014-06-08 DIAGNOSIS — D122 Benign neoplasm of ascending colon: Secondary | ICD-10-CM | POA: Diagnosis not present

## 2014-06-08 DIAGNOSIS — K5730 Diverticulosis of large intestine without perforation or abscess without bleeding: Secondary | ICD-10-CM | POA: Diagnosis not present

## 2014-06-08 DIAGNOSIS — M1990 Unspecified osteoarthritis, unspecified site: Secondary | ICD-10-CM | POA: Diagnosis not present

## 2014-06-08 DIAGNOSIS — D123 Benign neoplasm of transverse colon: Secondary | ICD-10-CM | POA: Diagnosis not present

## 2014-06-08 DIAGNOSIS — J45909 Unspecified asthma, uncomplicated: Secondary | ICD-10-CM | POA: Diagnosis not present

## 2014-06-08 DIAGNOSIS — K2970 Gastritis, unspecified, without bleeding: Secondary | ICD-10-CM | POA: Diagnosis not present

## 2014-06-08 DIAGNOSIS — Z1211 Encounter for screening for malignant neoplasm of colon: Secondary | ICD-10-CM | POA: Diagnosis not present

## 2014-06-08 DIAGNOSIS — K5790 Diverticulosis of intestine, part unspecified, without perforation or abscess without bleeding: Secondary | ICD-10-CM | POA: Diagnosis not present

## 2014-06-08 DIAGNOSIS — K648 Other hemorrhoids: Secondary | ICD-10-CM | POA: Diagnosis not present

## 2014-06-08 DIAGNOSIS — Z79899 Other long term (current) drug therapy: Secondary | ICD-10-CM | POA: Diagnosis not present

## 2014-06-08 DIAGNOSIS — K529 Noninfective gastroenteritis and colitis, unspecified: Secondary | ICD-10-CM | POA: Diagnosis not present

## 2014-06-08 DIAGNOSIS — Z87891 Personal history of nicotine dependence: Secondary | ICD-10-CM | POA: Diagnosis not present

## 2014-06-08 DIAGNOSIS — Z882 Allergy status to sulfonamides status: Secondary | ICD-10-CM | POA: Diagnosis not present

## 2014-06-08 DIAGNOSIS — K635 Polyp of colon: Secondary | ICD-10-CM | POA: Diagnosis not present

## 2014-06-08 DIAGNOSIS — Z7982 Long term (current) use of aspirin: Secondary | ICD-10-CM | POA: Diagnosis not present

## 2014-06-08 DIAGNOSIS — K640 First degree hemorrhoids: Secondary | ICD-10-CM | POA: Diagnosis not present

## 2014-06-08 DIAGNOSIS — L309 Dermatitis, unspecified: Secondary | ICD-10-CM | POA: Diagnosis not present

## 2014-06-08 DIAGNOSIS — Z881 Allergy status to other antibiotic agents status: Secondary | ICD-10-CM | POA: Diagnosis not present

## 2014-06-08 DIAGNOSIS — Z86010 Personal history of colonic polyps: Secondary | ICD-10-CM | POA: Diagnosis not present

## 2014-06-08 DIAGNOSIS — R000 Tachycardia, unspecified: Secondary | ICD-10-CM | POA: Diagnosis not present

## 2014-06-08 DIAGNOSIS — Z9889 Other specified postprocedural states: Secondary | ICD-10-CM | POA: Diagnosis not present

## 2014-06-08 DIAGNOSIS — Z886 Allergy status to analgesic agent status: Secondary | ICD-10-CM | POA: Diagnosis not present

## 2014-06-08 DIAGNOSIS — Z09 Encounter for follow-up examination after completed treatment for conditions other than malignant neoplasm: Secondary | ICD-10-CM | POA: Diagnosis not present

## 2014-06-11 DIAGNOSIS — M75121 Complete rotator cuff tear or rupture of right shoulder, not specified as traumatic: Secondary | ICD-10-CM | POA: Diagnosis not present

## 2014-06-11 DIAGNOSIS — J301 Allergic rhinitis due to pollen: Secondary | ICD-10-CM | POA: Diagnosis not present

## 2014-06-11 DIAGNOSIS — J3089 Other allergic rhinitis: Secondary | ICD-10-CM | POA: Diagnosis not present

## 2014-06-13 DIAGNOSIS — M25121 Fistula, right elbow: Secondary | ICD-10-CM | POA: Diagnosis not present

## 2014-06-13 DIAGNOSIS — J0101 Acute recurrent maxillary sinusitis: Secondary | ICD-10-CM | POA: Diagnosis not present

## 2014-06-13 DIAGNOSIS — J301 Allergic rhinitis due to pollen: Secondary | ICD-10-CM | POA: Diagnosis not present

## 2014-06-14 DIAGNOSIS — E042 Nontoxic multinodular goiter: Secondary | ICD-10-CM | POA: Diagnosis not present

## 2014-06-18 DIAGNOSIS — M75121 Complete rotator cuff tear or rupture of right shoulder, not specified as traumatic: Secondary | ICD-10-CM | POA: Diagnosis not present

## 2014-06-22 DIAGNOSIS — Z86010 Personal history of colonic polyps: Secondary | ICD-10-CM | POA: Diagnosis not present

## 2014-06-22 DIAGNOSIS — K529 Noninfective gastroenteritis and colitis, unspecified: Secondary | ICD-10-CM | POA: Diagnosis not present

## 2014-06-25 DIAGNOSIS — J3089 Other allergic rhinitis: Secondary | ICD-10-CM | POA: Diagnosis not present

## 2014-06-25 DIAGNOSIS — L570 Actinic keratosis: Secondary | ICD-10-CM | POA: Diagnosis not present

## 2014-06-25 DIAGNOSIS — J301 Allergic rhinitis due to pollen: Secondary | ICD-10-CM | POA: Diagnosis not present

## 2014-06-25 DIAGNOSIS — L821 Other seborrheic keratosis: Secondary | ICD-10-CM | POA: Diagnosis not present

## 2014-06-25 DIAGNOSIS — L578 Other skin changes due to chronic exposure to nonionizing radiation: Secondary | ICD-10-CM | POA: Diagnosis not present

## 2014-06-26 DIAGNOSIS — E2839 Other primary ovarian failure: Secondary | ICD-10-CM | POA: Diagnosis not present

## 2014-06-26 DIAGNOSIS — M810 Age-related osteoporosis without current pathological fracture: Secondary | ICD-10-CM | POA: Diagnosis not present

## 2014-06-26 DIAGNOSIS — Z780 Asymptomatic menopausal state: Secondary | ICD-10-CM | POA: Diagnosis not present

## 2014-07-10 DIAGNOSIS — M810 Age-related osteoporosis without current pathological fracture: Secondary | ICD-10-CM | POA: Diagnosis not present

## 2014-07-10 DIAGNOSIS — E042 Nontoxic multinodular goiter: Secondary | ICD-10-CM | POA: Diagnosis not present

## 2014-07-10 DIAGNOSIS — R7301 Impaired fasting glucose: Secondary | ICD-10-CM | POA: Diagnosis not present

## 2014-07-10 DIAGNOSIS — J301 Allergic rhinitis due to pollen: Secondary | ICD-10-CM | POA: Diagnosis not present

## 2014-07-10 DIAGNOSIS — E782 Mixed hyperlipidemia: Secondary | ICD-10-CM | POA: Diagnosis not present

## 2014-07-10 DIAGNOSIS — J3089 Other allergic rhinitis: Secondary | ICD-10-CM | POA: Diagnosis not present

## 2014-07-10 DIAGNOSIS — M75101 Unspecified rotator cuff tear or rupture of right shoulder, not specified as traumatic: Secondary | ICD-10-CM | POA: Diagnosis not present

## 2014-07-20 DIAGNOSIS — I493 Ventricular premature depolarization: Secondary | ICD-10-CM | POA: Insufficient documentation

## 2014-07-23 DIAGNOSIS — R7301 Impaired fasting glucose: Secondary | ICD-10-CM | POA: Diagnosis not present

## 2014-07-23 DIAGNOSIS — E042 Nontoxic multinodular goiter: Secondary | ICD-10-CM | POA: Diagnosis not present

## 2014-07-23 DIAGNOSIS — J3089 Other allergic rhinitis: Secondary | ICD-10-CM | POA: Diagnosis not present

## 2014-07-23 DIAGNOSIS — I493 Ventricular premature depolarization: Secondary | ICD-10-CM | POA: Diagnosis not present

## 2014-07-23 DIAGNOSIS — J301 Allergic rhinitis due to pollen: Secondary | ICD-10-CM | POA: Diagnosis not present

## 2014-07-23 DIAGNOSIS — R000 Tachycardia, unspecified: Secondary | ICD-10-CM | POA: Diagnosis not present

## 2014-07-23 DIAGNOSIS — E782 Mixed hyperlipidemia: Secondary | ICD-10-CM | POA: Diagnosis not present

## 2014-08-06 DIAGNOSIS — J3089 Other allergic rhinitis: Secondary | ICD-10-CM | POA: Diagnosis not present

## 2014-08-06 DIAGNOSIS — J301 Allergic rhinitis due to pollen: Secondary | ICD-10-CM | POA: Diagnosis not present

## 2014-08-10 HISTORY — PX: EYE SURGERY: SHX253

## 2014-08-20 DIAGNOSIS — J301 Allergic rhinitis due to pollen: Secondary | ICD-10-CM | POA: Diagnosis not present

## 2014-08-20 DIAGNOSIS — J3089 Other allergic rhinitis: Secondary | ICD-10-CM | POA: Diagnosis not present

## 2014-08-20 DIAGNOSIS — J4540 Moderate persistent asthma, uncomplicated: Secondary | ICD-10-CM | POA: Diagnosis not present

## 2014-08-30 DIAGNOSIS — J301 Allergic rhinitis due to pollen: Secondary | ICD-10-CM | POA: Diagnosis not present

## 2014-08-30 DIAGNOSIS — J3089 Other allergic rhinitis: Secondary | ICD-10-CM | POA: Diagnosis not present

## 2014-09-03 DIAGNOSIS — J3089 Other allergic rhinitis: Secondary | ICD-10-CM | POA: Diagnosis not present

## 2014-09-03 DIAGNOSIS — J301 Allergic rhinitis due to pollen: Secondary | ICD-10-CM | POA: Diagnosis not present

## 2014-09-06 DIAGNOSIS — M75121 Complete rotator cuff tear or rupture of right shoulder, not specified as traumatic: Secondary | ICD-10-CM | POA: Diagnosis not present

## 2014-09-10 DIAGNOSIS — N3020 Other chronic cystitis without hematuria: Secondary | ICD-10-CM | POA: Diagnosis not present

## 2014-09-17 DIAGNOSIS — J301 Allergic rhinitis due to pollen: Secondary | ICD-10-CM | POA: Diagnosis not present

## 2014-09-17 DIAGNOSIS — J3089 Other allergic rhinitis: Secondary | ICD-10-CM | POA: Diagnosis not present

## 2014-10-01 DIAGNOSIS — J3089 Other allergic rhinitis: Secondary | ICD-10-CM | POA: Diagnosis not present

## 2014-10-01 DIAGNOSIS — J301 Allergic rhinitis due to pollen: Secondary | ICD-10-CM | POA: Diagnosis not present

## 2014-10-15 DIAGNOSIS — M810 Age-related osteoporosis without current pathological fracture: Secondary | ICD-10-CM | POA: Diagnosis not present

## 2014-10-15 DIAGNOSIS — M75101 Unspecified rotator cuff tear or rupture of right shoulder, not specified as traumatic: Secondary | ICD-10-CM | POA: Diagnosis not present

## 2014-10-15 DIAGNOSIS — H2513 Age-related nuclear cataract, bilateral: Secondary | ICD-10-CM | POA: Diagnosis not present

## 2014-10-15 DIAGNOSIS — R799 Abnormal finding of blood chemistry, unspecified: Secondary | ICD-10-CM | POA: Diagnosis not present

## 2014-10-15 DIAGNOSIS — J301 Allergic rhinitis due to pollen: Secondary | ICD-10-CM | POA: Diagnosis not present

## 2014-10-15 DIAGNOSIS — J4540 Moderate persistent asthma, uncomplicated: Secondary | ICD-10-CM | POA: Diagnosis not present

## 2014-10-15 DIAGNOSIS — F330 Major depressive disorder, recurrent, mild: Secondary | ICD-10-CM | POA: Diagnosis not present

## 2014-10-15 DIAGNOSIS — J3089 Other allergic rhinitis: Secondary | ICD-10-CM | POA: Diagnosis not present

## 2014-10-29 DIAGNOSIS — J301 Allergic rhinitis due to pollen: Secondary | ICD-10-CM | POA: Diagnosis not present

## 2014-10-29 DIAGNOSIS — J3089 Other allergic rhinitis: Secondary | ICD-10-CM | POA: Diagnosis not present

## 2014-11-12 DIAGNOSIS — J3089 Other allergic rhinitis: Secondary | ICD-10-CM | POA: Diagnosis not present

## 2014-11-12 DIAGNOSIS — J301 Allergic rhinitis due to pollen: Secondary | ICD-10-CM | POA: Diagnosis not present

## 2014-11-26 ENCOUNTER — Ambulatory Visit
Admit: 2014-11-26 | Disposition: A | Discharge status: Home / Self Care | Payer: Self-pay | Attending: Ophthalmology | Admitting: Ophthalmology

## 2014-11-26 DIAGNOSIS — J301 Allergic rhinitis due to pollen: Secondary | ICD-10-CM | POA: Diagnosis not present

## 2014-11-26 DIAGNOSIS — J3089 Other allergic rhinitis: Secondary | ICD-10-CM | POA: Diagnosis not present

## 2014-11-26 DIAGNOSIS — Z01810 Encounter for preprocedural cardiovascular examination: Secondary | ICD-10-CM | POA: Diagnosis not present

## 2014-11-26 DIAGNOSIS — H2511 Age-related nuclear cataract, right eye: Secondary | ICD-10-CM | POA: Diagnosis not present

## 2014-11-26 DIAGNOSIS — H2513 Age-related nuclear cataract, bilateral: Secondary | ICD-10-CM | POA: Diagnosis not present

## 2014-11-26 DIAGNOSIS — I10 Essential (primary) hypertension: Secondary | ICD-10-CM | POA: Diagnosis not present

## 2014-11-29 DIAGNOSIS — J301 Allergic rhinitis due to pollen: Secondary | ICD-10-CM | POA: Diagnosis not present

## 2014-11-29 DIAGNOSIS — J3089 Other allergic rhinitis: Secondary | ICD-10-CM | POA: Diagnosis not present

## 2014-12-03 LAB — SURGICAL PATHOLOGY

## 2014-12-04 ENCOUNTER — Ambulatory Visit
Admit: 2014-12-04 | Disposition: A | Discharge status: Home / Self Care | Payer: Self-pay | Attending: Ophthalmology | Admitting: Ophthalmology

## 2014-12-04 DIAGNOSIS — H2511 Age-related nuclear cataract, right eye: Secondary | ICD-10-CM | POA: Diagnosis not present

## 2014-12-04 DIAGNOSIS — G4730 Sleep apnea, unspecified: Secondary | ICD-10-CM | POA: Diagnosis not present

## 2014-12-04 DIAGNOSIS — R000 Tachycardia, unspecified: Secondary | ICD-10-CM | POA: Diagnosis not present

## 2014-12-04 DIAGNOSIS — M1990 Unspecified osteoarthritis, unspecified site: Secondary | ICD-10-CM | POA: Diagnosis not present

## 2014-12-04 DIAGNOSIS — Z882 Allergy status to sulfonamides status: Secondary | ICD-10-CM | POA: Diagnosis not present

## 2014-12-04 DIAGNOSIS — I10 Essential (primary) hypertension: Secondary | ICD-10-CM | POA: Diagnosis not present

## 2014-12-04 DIAGNOSIS — Z881 Allergy status to other antibiotic agents status: Secondary | ICD-10-CM | POA: Diagnosis not present

## 2014-12-04 DIAGNOSIS — K219 Gastro-esophageal reflux disease without esophagitis: Secondary | ICD-10-CM | POA: Diagnosis not present

## 2014-12-04 DIAGNOSIS — F329 Major depressive disorder, single episode, unspecified: Secondary | ICD-10-CM | POA: Diagnosis not present

## 2014-12-04 DIAGNOSIS — E780 Pure hypercholesterolemia: Secondary | ICD-10-CM | POA: Diagnosis not present

## 2014-12-04 DIAGNOSIS — E119 Type 2 diabetes mellitus without complications: Secondary | ICD-10-CM | POA: Diagnosis not present

## 2014-12-04 DIAGNOSIS — M797 Fibromyalgia: Secondary | ICD-10-CM | POA: Diagnosis not present

## 2014-12-04 DIAGNOSIS — H2513 Age-related nuclear cataract, bilateral: Secondary | ICD-10-CM | POA: Diagnosis not present

## 2014-12-04 DIAGNOSIS — Z888 Allergy status to other drugs, medicaments and biological substances status: Secondary | ICD-10-CM | POA: Diagnosis not present

## 2014-12-09 NOTE — Op Note (Signed)
PATIENT NAME:  Vanessa Evans, Vanessa Evans MR#:  [id number] DATE OF BIRTH:  12/29/1945  DATE OF PROCEDURE:  12/04/2014  PREOPERATIVE DIAGNOSIS:  Nuclear sclerotic cataract of the right eye.   POSTOPERATIVE DIAGNOSIS:  Nuclear sclerotic cataract of the right eye.   OPERATIVE PROCEDURE:  Cataract extraction by phacoemulsification with implant of intraocular lens to right eye.   SURGEON:  Birder Robson, MD.   ANESTHESIA:  1. Managed anesthesia care.  2. Topical tetracaine drops followed by 2% Xylocaine jelly applied in the preoperative holding area.   COMPLICATIONS:  None.   TECHNIQUE:  Stop and chop.   DESCRIPTION OF PROCEDURE:  The patient was examined and consented in the preoperative holding area where the aforementioned topical anesthesia was applied to the right eye and then brought back to the Operating Room where the right eye was prepped and draped in the usual sterile ophthalmic fashion and a lid speculum was placed. A paracentesis was created with the side port blade and the anterior chamber was filled with viscoelastic. A near clear corneal incision was performed with the steel keratome. A continuous curvilinear capsulorrhexis was performed with a cystotome followed by the capsulorrhexis forceps. Hydrodissection and hydrodelineation were carried out with BSS on a blunt cannula. The lens was removed in a stop and chop technique and the remaining cortical material was removed with the irrigation-aspiration handpiece. The capsular bag was inflated with viscoelastic and the Tecnis ZCB00, 19.0-diopter lens, serial number [id number], was placed in the capsular bag without complication. The remaining viscoelastic was removed from the eye with the irrigation-aspiration handpiece. The wounds were hydrated. The anterior chamber was flushed with Miostat and the eye was inflated to physiologic pressure. 0.1 mL of cefuroxime concentration 10 mg/mL was placed in the anterior chamber. The wounds were found to  be water tight. The eye was not dressed with Vigamox at the end of case due to fluoroquinolone allergy, rather Polytrim was used. The patient was given protective glasses to wear throughout the day and a shield with which to sleep tonight. The patient was also given drops with which to begin a drop regimen today and will follow-up with me in one day.    ____________________________ Vanessa Diones. Stepheni Cameron, MD wlp:at D: 12/04/2014 21:01:06 ET T: 12/05/2014 09:23:50 ET JOB#: [id number]  cc: Dalma Panchal L. Anylah Scheib, MD, <Dictator> Vanessa Diones Shooter Tangen MD ELECTRONICALLY SIGNED 12/05/2014 12:05

## 2014-12-10 DIAGNOSIS — J3089 Other allergic rhinitis: Secondary | ICD-10-CM | POA: Diagnosis not present

## 2014-12-10 DIAGNOSIS — J301 Allergic rhinitis due to pollen: Secondary | ICD-10-CM | POA: Diagnosis not present

## 2014-12-17 DIAGNOSIS — R51 Headache: Secondary | ICD-10-CM | POA: Diagnosis not present

## 2014-12-17 DIAGNOSIS — R4189 Other symptoms and signs involving cognitive functions and awareness: Secondary | ICD-10-CM | POA: Diagnosis not present

## 2014-12-19 DIAGNOSIS — H2512 Age-related nuclear cataract, left eye: Secondary | ICD-10-CM | POA: Diagnosis not present

## 2014-12-20 ENCOUNTER — Encounter: Payer: Self-pay | Admitting: *Deleted

## 2014-12-20 DIAGNOSIS — Z9849 Cataract extraction status, unspecified eye: Secondary | ICD-10-CM | POA: Diagnosis not present

## 2014-12-20 DIAGNOSIS — Z881 Allergy status to other antibiotic agents status: Secondary | ICD-10-CM | POA: Diagnosis not present

## 2014-12-20 DIAGNOSIS — I10 Essential (primary) hypertension: Secondary | ICD-10-CM | POA: Diagnosis not present

## 2014-12-20 DIAGNOSIS — R011 Cardiac murmur, unspecified: Secondary | ICD-10-CM | POA: Diagnosis not present

## 2014-12-20 DIAGNOSIS — H269 Unspecified cataract: Secondary | ICD-10-CM | POA: Diagnosis present

## 2014-12-20 DIAGNOSIS — Z7982 Long term (current) use of aspirin: Secondary | ICD-10-CM | POA: Diagnosis not present

## 2014-12-20 DIAGNOSIS — K219 Gastro-esophageal reflux disease without esophagitis: Secondary | ICD-10-CM | POA: Diagnosis not present

## 2014-12-20 DIAGNOSIS — M1990 Unspecified osteoarthritis, unspecified site: Secondary | ICD-10-CM | POA: Diagnosis not present

## 2014-12-20 DIAGNOSIS — Z87891 Personal history of nicotine dependence: Secondary | ICD-10-CM | POA: Diagnosis not present

## 2014-12-20 DIAGNOSIS — R0601 Orthopnea: Secondary | ICD-10-CM | POA: Diagnosis not present

## 2014-12-20 DIAGNOSIS — F329 Major depressive disorder, single episode, unspecified: Secondary | ICD-10-CM | POA: Diagnosis not present

## 2014-12-20 DIAGNOSIS — Z885 Allergy status to narcotic agent status: Secondary | ICD-10-CM | POA: Diagnosis not present

## 2014-12-20 DIAGNOSIS — Z888 Allergy status to other drugs, medicaments and biological substances status: Secondary | ICD-10-CM | POA: Diagnosis not present

## 2014-12-20 DIAGNOSIS — Z7951 Long term (current) use of inhaled steroids: Secondary | ICD-10-CM | POA: Diagnosis not present

## 2014-12-20 DIAGNOSIS — Z882 Allergy status to sulfonamides status: Secondary | ICD-10-CM | POA: Diagnosis not present

## 2014-12-20 DIAGNOSIS — H2512 Age-related nuclear cataract, left eye: Secondary | ICD-10-CM | POA: Diagnosis not present

## 2014-12-20 DIAGNOSIS — G4730 Sleep apnea, unspecified: Secondary | ICD-10-CM | POA: Diagnosis not present

## 2014-12-20 DIAGNOSIS — E119 Type 2 diabetes mellitus without complications: Secondary | ICD-10-CM | POA: Diagnosis not present

## 2014-12-20 DIAGNOSIS — Z886 Allergy status to analgesic agent status: Secondary | ICD-10-CM | POA: Diagnosis not present

## 2014-12-20 DIAGNOSIS — E780 Pure hypercholesterolemia: Secondary | ICD-10-CM | POA: Diagnosis not present

## 2014-12-25 ENCOUNTER — Ambulatory Visit: Payer: Medicare Other | Admitting: Anesthesiology

## 2014-12-25 ENCOUNTER — Ambulatory Visit
Admission: RE | Admit: 2014-12-25 | Discharge: 2014-12-25 | Disposition: A | Discharge status: Home / Self Care | Payer: Medicare Other | Source: Ambulatory Visit | Attending: Ophthalmology | Admitting: Ophthalmology

## 2014-12-25 ENCOUNTER — Encounter
Admission: RE | Disposition: A | Discharge status: Home / Self Care | Payer: Self-pay | Source: Ambulatory Visit | Attending: Ophthalmology

## 2014-12-25 ENCOUNTER — Encounter: Payer: Self-pay | Admitting: *Deleted

## 2014-12-25 DIAGNOSIS — H2512 Age-related nuclear cataract, left eye: Secondary | ICD-10-CM | POA: Insufficient documentation

## 2014-12-25 DIAGNOSIS — E780 Pure hypercholesterolemia: Secondary | ICD-10-CM | POA: Insufficient documentation

## 2014-12-25 DIAGNOSIS — Z7982 Long term (current) use of aspirin: Secondary | ICD-10-CM | POA: Insufficient documentation

## 2014-12-25 DIAGNOSIS — Z882 Allergy status to sulfonamides status: Secondary | ICD-10-CM | POA: Insufficient documentation

## 2014-12-25 DIAGNOSIS — Z881 Allergy status to other antibiotic agents status: Secondary | ICD-10-CM | POA: Insufficient documentation

## 2014-12-25 DIAGNOSIS — E119 Type 2 diabetes mellitus without complications: Secondary | ICD-10-CM | POA: Insufficient documentation

## 2014-12-25 DIAGNOSIS — I10 Essential (primary) hypertension: Secondary | ICD-10-CM | POA: Insufficient documentation

## 2014-12-25 DIAGNOSIS — G4730 Sleep apnea, unspecified: Secondary | ICD-10-CM | POA: Diagnosis not present

## 2014-12-25 DIAGNOSIS — Z888 Allergy status to other drugs, medicaments and biological substances status: Secondary | ICD-10-CM | POA: Insufficient documentation

## 2014-12-25 DIAGNOSIS — K219 Gastro-esophageal reflux disease without esophagitis: Secondary | ICD-10-CM | POA: Diagnosis not present

## 2014-12-25 DIAGNOSIS — R011 Cardiac murmur, unspecified: Secondary | ICD-10-CM | POA: Insufficient documentation

## 2014-12-25 DIAGNOSIS — Z7951 Long term (current) use of inhaled steroids: Secondary | ICD-10-CM | POA: Insufficient documentation

## 2014-12-25 DIAGNOSIS — M1990 Unspecified osteoarthritis, unspecified site: Secondary | ICD-10-CM | POA: Insufficient documentation

## 2014-12-25 DIAGNOSIS — F329 Major depressive disorder, single episode, unspecified: Secondary | ICD-10-CM | POA: Insufficient documentation

## 2014-12-25 DIAGNOSIS — Z886 Allergy status to analgesic agent status: Secondary | ICD-10-CM | POA: Insufficient documentation

## 2014-12-25 DIAGNOSIS — Z885 Allergy status to narcotic agent status: Secondary | ICD-10-CM | POA: Insufficient documentation

## 2014-12-25 DIAGNOSIS — R0601 Orthopnea: Secondary | ICD-10-CM | POA: Insufficient documentation

## 2014-12-25 DIAGNOSIS — Z9849 Cataract extraction status, unspecified eye: Secondary | ICD-10-CM | POA: Insufficient documentation

## 2014-12-25 DIAGNOSIS — Z87891 Personal history of nicotine dependence: Secondary | ICD-10-CM | POA: Insufficient documentation

## 2014-12-25 HISTORY — DX: Major depressive disorder, single episode, unspecified: F32.9

## 2014-12-25 HISTORY — DX: Orthopnea: R06.01

## 2014-12-25 HISTORY — PX: CATARACT EXTRACTION W/PHACO: SHX586

## 2014-12-25 HISTORY — DX: Depression, unspecified: F32.A

## 2014-12-25 SURGERY — PHACOEMULSIFICATION, CATARACT, WITH IOL INSERTION
Anesthesia: Monitor Anesthesia Care | Laterality: Left

## 2014-12-25 MED ORDER — SODIUM CHLORIDE 0.9 % IV SOLN
INTRAVENOUS | Status: DC
  Administered 2014-12-25: 11:00:00 via INTRAVENOUS

## 2014-12-25 MED ORDER — LIDOCAINE HCL 3.5 % OP GEL
OPHTHALMIC | Status: AC
  Filled 2014-12-25: qty 1

## 2014-12-25 MED ORDER — POLYMYXIN B-TRIMETHOPRIM 10000-0.1 UNIT/ML-% OP SOLN
OPHTHALMIC | Status: AC
  Filled 2014-12-25: qty 10

## 2014-12-25 MED ORDER — PHENYLEPHRINE HCL 10 % OP SOLN
1.0000 [drp] | OPHTHALMIC | Status: DC | PRN
  Administered 2014-12-25 (×4): 1 [drp] via OPHTHALMIC

## 2014-12-25 MED ORDER — POVIDONE-IODINE 5 % OP SOLN
OPHTHALMIC | Status: AC
  Filled 2014-12-25: qty 30

## 2014-12-25 MED ORDER — BSS IO SOLN
INTRAOCULAR | Status: DC | PRN
  Administered 2014-12-25: 200 mL

## 2014-12-25 MED ORDER — LIDOCAINE HCL 3.5 % OP GEL
1.0000 "application " | Freq: Once | OPHTHALMIC | Status: AC
  Administered 2014-12-25: 1 via OPHTHALMIC

## 2014-12-25 MED ORDER — TETRACAINE HCL 0.5 % OP SOLN
OPHTHALMIC | Status: AC
  Filled 2014-12-25: qty 2

## 2014-12-25 MED ORDER — FENTANYL CITRATE (PF) 100 MCG/2ML IJ SOLN
INTRAMUSCULAR | Status: DC | PRN
  Administered 2014-12-25: 50 ug via INTRAVENOUS

## 2014-12-25 MED ORDER — CYCLOPENTOLATE HCL 2 % OP SOLN
OPHTHALMIC | Status: AC
  Filled 2014-12-25: qty 2

## 2014-12-25 MED ORDER — CYCLOPENTOLATE HCL 2 % OP SOLN
1.0000 [drp] | OPHTHALMIC | Status: DC
  Administered 2014-12-25 (×4): 1 [drp] via OPHTHALMIC

## 2014-12-25 MED ORDER — EPINEPHRINE HCL 1 MG/ML IJ SOLN
INTRAMUSCULAR | Status: AC
  Filled 2014-12-25: qty 1

## 2014-12-25 MED ORDER — CARBACHOL 0.01 % IO SOLN
INTRAOCULAR | Status: DC | PRN
  Administered 2014-12-25: 0.5 mL via INTRAOCULAR

## 2014-12-25 MED ORDER — POLYMYXIN B-TRIMETHOPRIM 10000-0.1 UNIT/ML-% OP SOLN: 1.0000 [drp] | OPHTHALMIC | Status: AC

## 2014-12-25 MED ORDER — PHENYLEPHRINE HCL 10 % OP SOLN
OPHTHALMIC | Status: AC
  Filled 2014-12-25: qty 5

## 2014-12-25 MED ORDER — MOXIFLOXACIN HCL 0.5 % OP SOLN - NO CHARGE: OPHTHALMIC | Status: DC | PRN

## 2014-12-25 MED ORDER — MIDAZOLAM HCL 2 MG/2ML IJ SOLN
INTRAMUSCULAR | Status: DC | PRN
  Administered 2014-12-25: 2 mg via INTRAVENOUS

## 2014-12-25 MED ORDER — CEFUROXIME OPHTHALMIC INJECTION 1 MG/0.1 ML
INJECTION | OPHTHALMIC | Status: AC
  Filled 2014-12-25: qty 0.1

## 2014-12-25 MED ORDER — CYCLOPENTOLATE HCL 2 % OP SOLN
OPHTHALMIC | Status: AC
  Administered 2014-12-25: 1 [drp] via OPHTHALMIC
  Filled 2014-12-25: qty 2

## 2014-12-25 MED ORDER — TETRACAINE HCL 0.5 % OP SOLN: 1.0000 [drp] | OPHTHALMIC | Status: DC | PRN

## 2014-12-25 MED ORDER — TETRACAINE HCL 0.5 % OP SOLN
1.0000 [drp] | OPHTHALMIC | Status: DC | PRN
  Administered 2014-12-25: 1 [drp] via OPHTHALMIC

## 2014-12-25 MED ORDER — NA CHONDROIT SULF-NA HYALURON 40-17 MG/ML IO SOLN
INTRAOCULAR | Status: AC
  Filled 2014-12-25: qty 1

## 2014-12-25 MED ORDER — POLYMYXIN B-TRIMETHOPRIM 10000-0.1 UNIT/ML-% OP SOLN
OPHTHALMIC | Status: DC | PRN
  Administered 2014-12-25: 1 [drp] via OPHTHALMIC

## 2014-12-25 MED ORDER — POLYMYXIN B-TRIMETHOPRIM 10000-0.1 UNIT/ML-% OP SOLN
OPHTHALMIC | Status: AC
  Administered 2014-12-25: 11:00:00 via OPHTHALMIC
  Filled 2014-12-25: qty 10

## 2014-12-25 MED ORDER — POVIDONE-IODINE 5 % OP SOLN
OPHTHALMIC | Status: AC
  Administered 2014-12-25: 1 via OPHTHALMIC
  Filled 2014-12-25: qty 30

## 2014-12-25 MED ORDER — CEFUROXIME OPHTHALMIC INJECTION 1 MG/0.1 ML
INJECTION | OPHTHALMIC | Status: DC | PRN
  Administered 2014-12-25: 0.1 mL via INTRACAMERAL

## 2014-12-25 MED ORDER — POVIDONE-IODINE 5 % OP SOLN
1.0000 "application " | OPHTHALMIC | Status: AC | PRN
  Administered 2014-12-25: 1 via OPHTHALMIC

## 2014-12-25 MED ORDER — PHENYLEPHRINE HCL 10 % OP SOLN
OPHTHALMIC | Status: AC
  Administered 2014-12-25: 11:00:00 via OPHTHALMIC
  Filled 2014-12-25: qty 5

## 2014-12-25 SURGICAL SUPPLY — 23 items
ACTIVE FMS ×2 IMPLANT
CANNULA ANT/CHMB 27GA (MISCELLANEOUS) ×2 IMPLANT
GLOVE BIO SURGEON STRL SZ8 (GLOVE) ×2 IMPLANT
GLOVE BIOGEL M 6.5 STRL (GLOVE) ×2 IMPLANT
GLOVE SURG LX 8.0 MICRO (GLOVE) ×1
GLOVE SURG LX STRL 8.0 MICRO (GLOVE) ×1 IMPLANT
GOWN STRL REUS W/ TWL LRG LVL3 (GOWN DISPOSABLE) ×2 IMPLANT
GOWN STRL REUS W/TWL LRG LVL3 (GOWN DISPOSABLE) ×2
LENS IOL TECNIS 18.0 (Intraocular Lens) ×2 IMPLANT
LENS IOL TECNIS MONO 1P 18.0 (Intraocular Lens) ×1 IMPLANT
PACK CATARACT (MISCELLANEOUS) ×2 IMPLANT
PACK CATARACT BRASINGTON LX (MISCELLANEOUS) ×2 IMPLANT
PACK EYE AFTER SURG (MISCELLANEOUS) ×2 IMPLANT
SOL BSS BAG (MISCELLANEOUS) ×2
SOL PREP PVP 2OZ (MISCELLANEOUS) ×2
SOLUTION BSS BAG (MISCELLANEOUS) ×1 IMPLANT
SOLUTION PREP PVP 2OZ (MISCELLANEOUS) ×1 IMPLANT
SYR 5ML LL (SYRINGE) ×2 IMPLANT
SYR TB 1ML 27GX1/2 LL (SYRINGE) ×2 IMPLANT
WATER STERILE IRR 1000ML POUR (IV SOLUTION) ×2 IMPLANT
WIPE NON LINTING 3.25X3.25 (MISCELLANEOUS) ×2 IMPLANT
ZCB0018.0 ×2 IMPLANT
zcb0018.0 ×2 IMPLANT

## 2014-12-25 NOTE — Discharge Instructions (Addendum)
See cataract post op handout    Eye Surgery Discharge Instructions  Expect mild scratchy sensation or mild soreness. DO NOT RUB YOUR EYE!  The day of surgery: . Minimal physical activity, but bed rest is not required . No reading, computer work, or close hand work . No bending, lifting, or straining. . May watch TV  For 24 hours: . No driving, legal decisions, or alcoholic beverages . Safety precautions . Eat anything you prefer: It is better to start with liquids, then soup then solid foods. . _____ Eye patch should be worn until postoperative exam tomorrow. . ____ Solar shield eyeglasses should be worn for comfort in the sunlight/patch while sleeping  Resume all regular medications including aspirin or Coumadin if these were discontinued prior to surgery. You may shower, bathe, shave, or wash your hair. Tylenol may be taken for mild discomfort.  Call your doctor if you experience significant pain, nausea, or vomiting, fever > 101 or other signs of infection. [phone] or [phone] Specific instructions:  Follow-up Information    Follow up with Tim Lair, MD. Go on 12/26/2014.   Specialty:  Ophthalmology   Why:  at 10:15, For wound re-check   Contact information:   [address] [place] [address] [phone]

## 2014-12-25 NOTE — Op Note (Signed)
PREOPERATIVE DIAGNOSIS:  Nuclear sclerotic cataract of the left eye.   POSTOPERATIVE DIAGNOSIS:  same   OPERATIVE PROCEDURE:  Procedure(s): CATARACT EXTRACTION PHACO AND INTRAOCULAR LENS PLACEMENT (IOC)   SURGEON:  Birder Robson, MD.   ANESTHESIA: 1.      Managed anesthesia care. 2.      Topical tetracaine drops followed by 2% Xylocaine jelly applied in the preoperative holding area.   COMPLICATIONS:  None.   TECHNIQUE:   Stop and chop   DESCRIPTION OF PROCEDURE:  The patient was examined and consented in the preoperative holding area where the aforementioned topical anesthesia was applied to the left eye and then brought back to the Operating Room where the left eye was prepped and draped in the usual sterile ophthalmic fashion and a lid speculum was placed. A paracentesis was created with the side port blade and the anterior chamber was filled with viscoelastic. A near clear corneal incision was performed with the steel keratome. A continuous curvilinear capsulorrhexis was performed with a cystotome followed by the capsulorrhexis forceps. Hydrodissection and hydrodelineation were carried out with BSS on a blunt cannula. The lens was removed in a stop and chop  technique and the remaining cortical material was removed with the irrigation-aspiration handpiece. The capsular bag was inflated with viscoelastic and the Technis ZCB00 lens was placed in the capsular bag without complication. The remaining viscoelastic was removed from the eye with the irrigation-aspiration handpiece. The wounds were hydrated. The anterior chamber was flushed with Miostat and the eye was inflated to physiologic pressure. 0.1 mL of cefuroxime concentration 10 mg/mL was placed in the anterior chamber. The wounds were found to be water tight. The eye was dressed with Vigamox. The patient was given protective glasses to wear throughout the day and a shield with which to sleep tonight. The patient was also given drops with  which to begin a drop regimen today and will follow-up with me in one day.  Implant Name Type Inv. Item Serial No. Manufacturer Lot No. LRB No. Used  [id number]     [id number]     Left 1    Electronically signed: [place] 12/25/2014 11:50 AM

## 2014-12-25 NOTE — Anesthesia Preprocedure Evaluation (Signed)
Anesthesia Evaluation  Patient identified by MRN, date of birth, ID band Patient awake    Reviewed: Allergy & Precautions, H&P , NPO status , Patient's Chart, lab work & pertinent test results, reviewed documented beta blocker date and time   Airway Mallampati: II  TM Distance: >3 FB Neck ROM: full    Dental no notable dental hx.    Pulmonary neg pulmonary ROS, former smoker,  breath sounds clear to auscultation  Pulmonary exam normal       Cardiovascular Exercise Tolerance: Good hypertension, + Orthopnea negative cardio ROS  + dysrhythmias + Valvular Problems/Murmurs Rhythm:regular Rate:Normal     Neuro/Psych  Headaches, negative neurological ROS  negative psych ROS   GI/Hepatic negative GI ROS, Neg liver ROS, GERD-  ,  Endo/Other  negative endocrine ROSdiabetes  Renal/GU negative Renal ROS  negative genitourinary   Musculoskeletal   Abdominal   Peds  Hematology negative hematology ROS (+)   Anesthesia Other Findings   Reproductive/Obstetrics negative OB ROS                             Anesthesia Physical Anesthesia Plan  ASA: III  Anesthesia Plan: MAC   Post-op Pain Management:    Induction:   Airway Management Planned:   Additional Equipment:   Intra-op Plan:   Post-operative Plan:   Informed Consent: I have reviewed the patients History and Physical, chart, labs and discussed the procedure including the risks, benefits and alternatives for the proposed anesthesia with the patient or authorized representative who has indicated his/her understanding and acceptance.   Dental Advisory Given  Plan Discussed with: CRNA  Anesthesia Plan Comments:         Anesthesia Quick Evaluation

## 2014-12-25 NOTE — H&P (Signed)
  All labs reviewed. Abnormal studies sent to patients PCP when indicated.  Previous H&P reviewed, patient examined, there are NO CHANGES.  

## 2014-12-25 NOTE — Transfer of Care (Signed)
Immediate Anesthesia Transfer of Care Note  Patient: Vanessa Evans  Procedure(s) Performed: Procedure(s) with comments: CATARACT EXTRACTION PHACO AND INTRAOCULAR LENS PLACEMENT (IOC) (Left) - [place] 00:32 AP% 20.0 CDE 6.49  Patient Location: PACU  Anesthesia Type:MAC  Level of Consciousness: awake, alert , oriented and patient cooperative  Airway & Oxygen Therapy: Patient Spontanous Breathing  Post-op Assessment: Report given to RN, Post -op Vital signs reviewed and stable and Patient moving all extremities X 4  Post vital signs: Reviewed and stable  Last Vitals:  Filed Vitals:   12/25/14 1153  BP: 109/69  Pulse: 67  Temp: 36.2 C  Resp: 16    Complications: No apparent anesthesia complications

## 2014-12-25 NOTE — Anesthesia Postprocedure Evaluation (Signed)
  Anesthesia Post-op Note  Patient: Vanessa Evans  Procedure(s) Performed: Procedure(s) with comments: CATARACT EXTRACTION PHACO AND INTRAOCULAR LENS PLACEMENT (IOC) (Left) - [place] 00:32 AP% 20.0 CDE 6.49  Anesthesia type:MAC  Patient location: PACU  Post pain: Pain level controlled  Post assessment: Post-op Vital signs reviewed, Patient's Cardiovascular Status Stable, Respiratory Function Stable, Patent Airway and No signs of Nausea or vomiting  Post vital signs: Reviewed and stable  Last Vitals:  Filed Vitals:   12/25/14 1153  BP: 109/69  Pulse: 67  Temp: 36.2 C  Resp: 16    Level of consciousness: awake, alert  and patient cooperative  Complications: No apparent anesthesia complications

## 2014-12-26 ENCOUNTER — Encounter: Payer: Self-pay | Admitting: Ophthalmology

## 2014-12-26 DIAGNOSIS — J3089 Other allergic rhinitis: Secondary | ICD-10-CM | POA: Diagnosis not present

## 2014-12-26 DIAGNOSIS — J301 Allergic rhinitis due to pollen: Secondary | ICD-10-CM | POA: Diagnosis not present

## 2015-01-04 DIAGNOSIS — N952 Postmenopausal atrophic vaginitis: Secondary | ICD-10-CM | POA: Diagnosis not present

## 2015-01-04 DIAGNOSIS — Z01419 Encounter for gynecological examination (general) (routine) without abnormal findings: Secondary | ICD-10-CM | POA: Diagnosis not present

## 2015-01-04 DIAGNOSIS — Z1231 Encounter for screening mammogram for malignant neoplasm of breast: Secondary | ICD-10-CM | POA: Diagnosis not present

## 2015-01-10 DIAGNOSIS — R490 Dysphonia: Secondary | ICD-10-CM | POA: Diagnosis not present

## 2015-01-10 DIAGNOSIS — J4540 Moderate persistent asthma, uncomplicated: Secondary | ICD-10-CM | POA: Diagnosis not present

## 2015-01-10 DIAGNOSIS — J301 Allergic rhinitis due to pollen: Secondary | ICD-10-CM | POA: Diagnosis not present

## 2015-01-18 DIAGNOSIS — E042 Nontoxic multinodular goiter: Secondary | ICD-10-CM | POA: Diagnosis not present

## 2015-01-18 DIAGNOSIS — R7301 Impaired fasting glucose: Secondary | ICD-10-CM | POA: Diagnosis not present

## 2015-01-18 DIAGNOSIS — M810 Age-related osteoporosis without current pathological fracture: Secondary | ICD-10-CM | POA: Diagnosis not present

## 2015-01-21 DIAGNOSIS — J301 Allergic rhinitis due to pollen: Secondary | ICD-10-CM | POA: Diagnosis not present

## 2015-01-21 DIAGNOSIS — J3089 Other allergic rhinitis: Secondary | ICD-10-CM | POA: Diagnosis not present

## 2015-02-04 DIAGNOSIS — M75101 Unspecified rotator cuff tear or rupture of right shoulder, not specified as traumatic: Secondary | ICD-10-CM | POA: Diagnosis not present

## 2015-02-04 DIAGNOSIS — R252 Cramp and spasm: Secondary | ICD-10-CM | POA: Diagnosis not present

## 2015-02-04 DIAGNOSIS — M79671 Pain in right foot: Secondary | ICD-10-CM | POA: Diagnosis not present

## 2015-02-04 DIAGNOSIS — J301 Allergic rhinitis due to pollen: Secondary | ICD-10-CM | POA: Diagnosis not present

## 2015-02-04 DIAGNOSIS — N952 Postmenopausal atrophic vaginitis: Secondary | ICD-10-CM | POA: Diagnosis not present

## 2015-02-04 DIAGNOSIS — E042 Nontoxic multinodular goiter: Secondary | ICD-10-CM | POA: Diagnosis not present

## 2015-02-04 DIAGNOSIS — J3089 Other allergic rhinitis: Secondary | ICD-10-CM | POA: Diagnosis not present

## 2015-02-04 DIAGNOSIS — J4540 Moderate persistent asthma, uncomplicated: Secondary | ICD-10-CM | POA: Diagnosis not present

## 2015-02-18 ENCOUNTER — Encounter: Payer: Self-pay | Admitting: Ophthalmology

## 2015-02-18 DIAGNOSIS — J301 Allergic rhinitis due to pollen: Secondary | ICD-10-CM | POA: Diagnosis not present

## 2015-02-18 DIAGNOSIS — M216X1 Other acquired deformities of right foot: Secondary | ICD-10-CM | POA: Diagnosis not present

## 2015-02-18 DIAGNOSIS — M21549 Acquired clubfoot, unspecified foot: Secondary | ICD-10-CM | POA: Diagnosis not present

## 2015-02-18 DIAGNOSIS — M76821 Posterior tibial tendinitis, right leg: Secondary | ICD-10-CM | POA: Diagnosis not present

## 2015-02-18 DIAGNOSIS — J3089 Other allergic rhinitis: Secondary | ICD-10-CM | POA: Diagnosis not present

## 2015-02-28 DIAGNOSIS — J3089 Other allergic rhinitis: Secondary | ICD-10-CM | POA: Diagnosis not present

## 2015-02-28 DIAGNOSIS — J301 Allergic rhinitis due to pollen: Secondary | ICD-10-CM | POA: Diagnosis not present

## 2015-03-03 DIAGNOSIS — B029 Zoster without complications: Secondary | ICD-10-CM | POA: Diagnosis not present

## 2015-03-11 DIAGNOSIS — M216X1 Other acquired deformities of right foot: Secondary | ICD-10-CM | POA: Diagnosis not present

## 2015-03-11 DIAGNOSIS — J3089 Other allergic rhinitis: Secondary | ICD-10-CM | POA: Diagnosis not present

## 2015-03-11 DIAGNOSIS — M21549 Acquired clubfoot, unspecified foot: Secondary | ICD-10-CM | POA: Diagnosis not present

## 2015-03-11 DIAGNOSIS — J301 Allergic rhinitis due to pollen: Secondary | ICD-10-CM | POA: Diagnosis not present

## 2015-03-11 DIAGNOSIS — M76821 Posterior tibial tendinitis, right leg: Secondary | ICD-10-CM | POA: Diagnosis not present

## 2015-03-15 DIAGNOSIS — N393 Stress incontinence (female) (male): Secondary | ICD-10-CM | POA: Diagnosis not present

## 2015-03-15 DIAGNOSIS — R350 Frequency of micturition: Secondary | ICD-10-CM | POA: Diagnosis not present

## 2015-03-15 DIAGNOSIS — R339 Retention of urine, unspecified: Secondary | ICD-10-CM | POA: Diagnosis not present

## 2015-03-15 DIAGNOSIS — N3020 Other chronic cystitis without hematuria: Secondary | ICD-10-CM | POA: Diagnosis not present

## 2015-03-26 DIAGNOSIS — J3089 Other allergic rhinitis: Secondary | ICD-10-CM | POA: Diagnosis not present

## 2015-03-26 DIAGNOSIS — J301 Allergic rhinitis due to pollen: Secondary | ICD-10-CM | POA: Diagnosis not present

## 2015-04-08 DIAGNOSIS — J3089 Other allergic rhinitis: Secondary | ICD-10-CM | POA: Diagnosis not present

## 2015-04-08 DIAGNOSIS — J301 Allergic rhinitis due to pollen: Secondary | ICD-10-CM | POA: Diagnosis not present

## 2015-04-08 DIAGNOSIS — H43823 Vitreomacular adhesion, bilateral: Secondary | ICD-10-CM | POA: Diagnosis not present

## 2015-04-22 DIAGNOSIS — J3089 Other allergic rhinitis: Secondary | ICD-10-CM | POA: Diagnosis not present

## 2015-04-22 DIAGNOSIS — J301 Allergic rhinitis due to pollen: Secondary | ICD-10-CM | POA: Diagnosis not present

## 2015-05-06 DIAGNOSIS — J301 Allergic rhinitis due to pollen: Secondary | ICD-10-CM | POA: Diagnosis not present

## 2015-05-06 DIAGNOSIS — J4540 Moderate persistent asthma, uncomplicated: Secondary | ICD-10-CM | POA: Diagnosis not present

## 2015-05-20 DIAGNOSIS — J301 Allergic rhinitis due to pollen: Secondary | ICD-10-CM | POA: Diagnosis not present

## 2015-05-20 DIAGNOSIS — J3089 Other allergic rhinitis: Secondary | ICD-10-CM | POA: Diagnosis not present

## 2015-05-28 DIAGNOSIS — J029 Acute pharyngitis, unspecified: Secondary | ICD-10-CM | POA: Diagnosis not present

## 2015-05-28 DIAGNOSIS — J45991 Cough variant asthma: Secondary | ICD-10-CM | POA: Diagnosis not present

## 2015-05-31 DIAGNOSIS — J3089 Other allergic rhinitis: Secondary | ICD-10-CM | POA: Diagnosis not present

## 2015-05-31 DIAGNOSIS — J301 Allergic rhinitis due to pollen: Secondary | ICD-10-CM | POA: Diagnosis not present

## 2015-06-03 DIAGNOSIS — R42 Dizziness and giddiness: Secondary | ICD-10-CM | POA: Diagnosis not present

## 2015-06-03 DIAGNOSIS — Z0001 Encounter for general adult medical examination with abnormal findings: Secondary | ICD-10-CM | POA: Diagnosis not present

## 2015-06-03 DIAGNOSIS — R5383 Other fatigue: Secondary | ICD-10-CM | POA: Diagnosis not present

## 2015-06-03 DIAGNOSIS — J301 Allergic rhinitis due to pollen: Secondary | ICD-10-CM | POA: Diagnosis not present

## 2015-06-03 DIAGNOSIS — I10 Essential (primary) hypertension: Secondary | ICD-10-CM | POA: Diagnosis not present

## 2015-06-03 DIAGNOSIS — E042 Nontoxic multinodular goiter: Secondary | ICD-10-CM | POA: Diagnosis not present

## 2015-06-03 DIAGNOSIS — J3089 Other allergic rhinitis: Secondary | ICD-10-CM | POA: Diagnosis not present

## 2015-06-03 DIAGNOSIS — R7301 Impaired fasting glucose: Secondary | ICD-10-CM | POA: Diagnosis not present

## 2015-06-03 DIAGNOSIS — J4541 Moderate persistent asthma with (acute) exacerbation: Secondary | ICD-10-CM | POA: Diagnosis not present

## 2015-06-17 ENCOUNTER — Ambulatory Visit: Payer: Medicare Other | Admitting: Physical Therapy

## 2015-06-20 ENCOUNTER — Ambulatory Visit: Payer: Medicare Other | Admitting: Physical Therapy

## 2015-06-24 DIAGNOSIS — D485 Neoplasm of uncertain behavior of skin: Secondary | ICD-10-CM | POA: Diagnosis not present

## 2015-06-24 DIAGNOSIS — T8069XS Other serum reaction due to other serum, sequela: Secondary | ICD-10-CM | POA: Diagnosis not present

## 2015-06-24 DIAGNOSIS — E782 Mixed hyperlipidemia: Secondary | ICD-10-CM | POA: Diagnosis not present

## 2015-06-24 DIAGNOSIS — D2272 Melanocytic nevi of left lower limb, including hip: Secondary | ICD-10-CM | POA: Diagnosis not present

## 2015-06-24 DIAGNOSIS — D225 Melanocytic nevi of trunk: Secondary | ICD-10-CM | POA: Diagnosis not present

## 2015-06-24 DIAGNOSIS — X32XXXA Exposure to sunlight, initial encounter: Secondary | ICD-10-CM | POA: Diagnosis not present

## 2015-06-24 DIAGNOSIS — L570 Actinic keratosis: Secondary | ICD-10-CM | POA: Diagnosis not present

## 2015-06-24 DIAGNOSIS — D2271 Melanocytic nevi of right lower limb, including hip: Secondary | ICD-10-CM | POA: Diagnosis not present

## 2015-06-24 DIAGNOSIS — E538 Deficiency of other specified B group vitamins: Secondary | ICD-10-CM | POA: Diagnosis not present

## 2015-06-24 DIAGNOSIS — Z0001 Encounter for general adult medical examination with abnormal findings: Secondary | ICD-10-CM | POA: Diagnosis not present

## 2015-06-24 DIAGNOSIS — E041 Nontoxic single thyroid nodule: Secondary | ICD-10-CM | POA: Diagnosis not present

## 2015-06-24 DIAGNOSIS — D2262 Melanocytic nevi of left upper limb, including shoulder: Secondary | ICD-10-CM | POA: Diagnosis not present

## 2015-06-26 DIAGNOSIS — E042 Nontoxic multinodular goiter: Secondary | ICD-10-CM | POA: Diagnosis not present

## 2015-06-26 DIAGNOSIS — Z23 Encounter for immunization: Secondary | ICD-10-CM | POA: Diagnosis not present

## 2015-06-27 ENCOUNTER — Encounter: Payer: Self-pay | Admitting: Physical Therapy

## 2015-06-27 ENCOUNTER — Ambulatory Visit: Payer: Medicare Other | Attending: Internal Medicine | Admitting: Physical Therapy

## 2015-06-27 DIAGNOSIS — R42 Dizziness and giddiness: Secondary | ICD-10-CM | POA: Insufficient documentation
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
## 2015-07-01 ENCOUNTER — Ambulatory Visit: Payer: Medicare Other | Admitting: Physical Therapy

## 2015-07-01 ENCOUNTER — Encounter: Payer: Self-pay | Admitting: Physical Therapy

## 2015-07-01 DIAGNOSIS — R42 Dizziness and giddiness: Secondary | ICD-10-CM

## 2015-07-01 DIAGNOSIS — J4540 Moderate persistent asthma, uncomplicated: Secondary | ICD-10-CM | POA: Diagnosis not present

## 2015-07-01 DIAGNOSIS — J0101 Acute recurrent maxillary sinusitis: Secondary | ICD-10-CM | POA: Diagnosis not present

## 2015-07-01 DIAGNOSIS — E782 Mixed hyperlipidemia: Secondary | ICD-10-CM | POA: Diagnosis not present

## 2015-07-01 DIAGNOSIS — J301 Allergic rhinitis due to pollen: Secondary | ICD-10-CM | POA: Diagnosis not present

## 2015-07-01 DIAGNOSIS — J3089 Other allergic rhinitis: Secondary | ICD-10-CM | POA: Diagnosis not present

## 2015-07-01 DIAGNOSIS — E042 Nontoxic multinodular goiter: Secondary | ICD-10-CM | POA: Diagnosis not present

## 2015-07-01 NOTE — Therapy (Signed)
[place] MAIN [facility] SERVICES [address] [place], [place], [address] Phone: [phone]   Fax:  [phone]  Physical Therapy Treatment  Patient Details  Name: Vanessa Evans MRN: [id number] Date of Birth: 1946/03/16 No Data Recorded  Encounter Date: 07/01/2015    Past Medical History  Diagnosis Date  . Dysrhythmia     tachycardia  . Heart murmur     aortic  . Shortness of breath     any exertion, cannot lie flat  . Hypertension   . Asthma     needs rescue inhaler few times a year  . Pneumonia     hx  . GERD (gastroesophageal reflux disease)   . Headache(784.0)   . Migraine   . Dizziness   . Arthritis     osteo  . Sleep apnea      no cpap  weight loss  . Depression   . Diabetes mellitus without complication ([place])   . PONV (postoperative nausea and vomiting)     very anxious during cataract surgery  . Orthopnea     Past Surgical History  Procedure Laterality Date  . Oophorectomy    . Joint replacement Right     knee  . Breast surgery Right     lumpectomy  . Hernia repair    . Colonoscopy w/ biopsies    . Lumbar laminectomy/decompression microdiscectomy Right 03/21/2013    Procedure: Right Lumbar five-Sacral one Laminectomy for Synovial Cyst;  Surgeon: Faythe Ghee, MD;  Location: [place] [facility] ORS;  Service: Neurosurgery;  Laterality: Right;  right  . Cataract extraction w/phaco Left 12/25/2014    Procedure: CATARACT EXTRACTION PHACO AND INTRAOCULAR LENS PLACEMENT (IOC);  Surgeon: Birder Robson, MD;  Location: [facility] ORS;  Service: Ophthalmology;  Laterality: Left;  [place] 00:32 AP% 20.0 CDE 6.49    There were no vitals filed for this visit.  Visit Diagnosis:  No diagnosis found.      Subjective Assessment - 07/01/15 1001    Subjective Pt reports she cannot tell alot of difference yet. Pt reports she has not had any vertigo episodes this past week but does state she has had some light-headedness. Pt states she  went to the doctor this morning for a follow-up appointment.      L Dix-Hallpike test: Pt with positive left Dix-Hallpike test with pt reporting 6/10 vertigo and noted 3 beats of torsional nystagmus after 5-10 second latency. Retested Dix-Hallpike test and it was negative with no nystagmus observed and pt reporting 0/10 vertigo.     Subjective: pt reports that she can get the lightheadedness/head floating sensation when walking outside in a parking lot, when getting out of car. Pt states it occurs not just with changes with position.   Neuromuscular Re-education: Following CRT, performed neuromuscular re-education.  Performed tandem walking 15' times 4 reps On foam pad, performed 30 seconds EC with NBOS, NBOS with and without horiz and vert head turns, and semi-tandem stance static holds and then body turns side to side.    HEP: forward walking with horiz head turns and sidestepping L/R with horiz head turns, tandem walking, tandem stance with body turns on pillow Plan next visit: Review HEP, retest Dix-Hallpike tests, continue working on progressions of balance exercises.   Clinical Impression: Pt with mildly positive left Dix-Hallpike test this date that responded well to canalith repositioning maneuver. Pt demonstrates difficulty with balance activities and would benefit from continued PT services to address symptoms and goals as set  on POC.        PT Long Term Goals - 06/27/15 1313    PT LONG TERM GOAL #1   Title Patient reports no vertigo with provoking motions or positions.   Time 8   Period Weeks   Status New   PT LONG TERM GOAL #2   Title Patient will reduce perceived disability to low levels as indicated by <40 on Dizziness Handicap Inventory.   Baseline 06/27/15: 44 moderate perception of handicap   Time 8   Period Weeks   Status New   PT LONG TERM GOAL #3   Title Patient will be able to perform home program independently for self-management.   Time 8   Period Weeks    Status New               Problem List There are no active problems to display for this patient.  Lady Deutscher PT, DPT Lady Deutscher 07/01/2015, 10:02 AM  [place] MAIN [facility] SERVICES [address] [place], [place], [address] Phone: [phone]   Fax:  [phone]  Name: Vanessa Evans MRN: [id number] Date of Birth: 1946/07/05

## 2015-07-08 ENCOUNTER — Encounter: Payer: Medicare Other | Admitting: Physical Therapy

## 2015-07-10 ENCOUNTER — Ambulatory Visit: Payer: Medicare Other

## 2015-07-10 DIAGNOSIS — R42 Dizziness and giddiness: Secondary | ICD-10-CM

## 2015-07-10 NOTE — Therapy (Signed)
[place] MAIN [facility] SERVICES [address] [place], [place], [address] Phone: [phone]   Fax:  [phone]  Physical Therapy Treatment  Patient Details  Name: Vanessa Evans MRN: [id number] Date of Birth: 13-Sep-1945 No Data Recorded  Encounter Date: 07/10/2015      PT End of Session - 07/10/15 1130    Visit Number 3   Number of Visits 8   Date for PT Re-Evaluation 08/22/15   PT Start Time 1105   PT Stop Time 1145   PT Time Calculation (min) 40 min   Equipment Utilized During Treatment Gait belt   Activity Tolerance Patient tolerated treatment well   Behavior During Therapy [facility] for tasks assessed/performed      Past Medical History  Diagnosis Date  . Dysrhythmia     tachycardia  . Heart murmur     aortic  . Shortness of breath     any exertion, cannot lie flat  . Hypertension   . Asthma     needs rescue inhaler few times a year  . Pneumonia     hx  . GERD (gastroesophageal reflux disease)   . Headache(784.0)   . Migraine   . Dizziness   . Arthritis     osteo  . Sleep apnea      no cpap  weight loss  . Depression   . Diabetes mellitus without complication ([place])   . PONV (postoperative nausea and vomiting)     very anxious during cataract surgery  . Orthopnea     Past Surgical History  Procedure Laterality Date  . Oophorectomy    . Joint replacement Right     knee  . Breast surgery Right     lumpectomy  . Hernia repair    . Colonoscopy w/ biopsies    . Lumbar laminectomy/decompression microdiscectomy Right 03/21/2013    Procedure: Right Lumbar five-Sacral one Laminectomy for Synovial Cyst;  Surgeon: Faythe Ghee, MD;  Location: [place] [facility] ORS;  Service: Neurosurgery;  Laterality: Right;  right  . Cataract extraction w/phaco Left 12/25/2014    Procedure: CATARACT EXTRACTION PHACO AND INTRAOCULAR LENS PLACEMENT (IOC);  Surgeon: Birder Robson, MD;  Location: [facility] ORS;  Service: Ophthalmology;  Laterality:  Left;  [place] 00:32 AP% 20.0 CDE 6.49    There were no vitals filed for this visit.  Visit Diagnosis:  Dizziness and giddiness      Subjective Assessment - 07/10/15 1108    Subjective Pt reports initial improvement in vertigo symptoms after last therapy session but feels like it has since felt "worse than it did before." Pt describes symptoms of vertigo of a few seconds duration when she rolls from her R side to her L side in bed. Pt has no further questions at this time. She is performing HEP and reports that tandem gait with head turns is challenging.    Currently in Pain? Yes   Pain Score 8    Pain Location Shoulder   Pain Orientation Right;Left   Pain Descriptors / Indicators Aching   Pain Type Chronic pain   Pain Relieving Factors Voltaren gel      Canalith Repositioning:  R Dix Hallpike test positive for upbeating R rotational nystagmus. 10 second latency and nystagmus lasts for approximately 30 seconds and is very vigorous. Pt reports 8/10 vertigo. Pt experiences reversal of nystagmus when sitting up after testing with appropriate L rotational upbeating nystagmus, vertigo, and nausea. Resolves after 20-30 seconds.  Positive L Dix-Hallpike test  but very faint with almost immediate onset of 5-10 beats of very faint L upbeating rotational nystagmus (observed with Frenzel lenses due to faint response). Pt reports 2/10 vertigo but symptoms dissipate quickly; Performed CRT for R posterior canal BPPV with 1 minute holds in each position; Repeat Dix-Hallpike testing negative on the R with negative nystagmus and no vertigo. Pt taken through second CRT; Pt provided handout and education regarding BPPV as well as instructions for home Epley maneuver. Instructed pt to perform 3 Epley cycles with 1 minute rest between, 1 time/day. Continue additional HEP.                            PT Education - 07/10/15 1130    Education provided Yes   Education Details BPPV, Home epley,  plan of care and prognosis   Person(s) Educated Patient   Methods Explanation   Comprehension Verbalized understanding             PT Long Term Goals - 06/27/15 1313    PT LONG TERM GOAL #1   Title Patient reports no vertigo with provoking motions or positions.   Time 8   Period Weeks   Status New   PT LONG TERM GOAL #2   Title Patient will reduce perceived disability to low levels as indicated by <40 on Dizziness Handicap Inventory.   Baseline 06/27/15: 44 moderate perception of handicap   Time 8   Period Weeks   Status New   PT LONG TERM GOAL #3   Title Patient will be able to perform home program independently for self-management.   Time 8   Period Weeks   Status New               Plan - 07/10/15 1130    Clinical Impression Statement Pt with positive R Dix Hallpike test on this date with 8/10 vertigo and vigorous appropriate nystagmus. Mild positive on L with only 2/10 vertigo and much shorter nystagmus. Treated R posterior canal BPPV with CRT and pt is negative with subseuqent Marye Round testing for both symptoms and nystagmus. Pt issued home Epley for R posterior canal BPPV. Will re-assess R side and if clear will return to treating L side at next visit. Pt provided educational materials and encouraged to follow-up as scheduled.    Pt will benefit from skilled therapeutic intervention in order to improve on the following deficits Decreased balance;Dizziness   Rehab Potential Good   Clinical Impairments Affecting Rehab Potential Positive: motivated  Negative: chronicity   PT Frequency 1x / week   PT Duration 8 weeks   PT Treatment/Interventions Vestibular;Canalith Repostioning;Gait training;Stair training;Therapeutic exercise;Therapeutic activities;Patient/family education;Neuromuscular re-education;Balance training   PT Next Visit Plan Re-test R Dix-Hallpike test and repeat CRT if indicated. If negative check L side and repeat CRT   PT Home Exercise Plan Home  Epley for R posterior canal BPPV   Consulted and Agree with Plan of Care Patient        Problem List There are no active problems to display for this patient.  Phillips Grout PT, DPT   Pammie Chirino 07/10/2015, 4:44 PM  [place] MAIN [facility] SERVICES [address] [place], [place], [address] Phone: [phone]   Fax:  [phone]  Name: Vanessa Evans MRN: [id number] Date of Birth: 04-Oct-1945

## 2015-07-12 DIAGNOSIS — R002 Palpitations: Secondary | ICD-10-CM | POA: Diagnosis not present

## 2015-07-12 DIAGNOSIS — E782 Mixed hyperlipidemia: Secondary | ICD-10-CM | POA: Diagnosis not present

## 2015-07-12 DIAGNOSIS — R0602 Shortness of breath: Secondary | ICD-10-CM | POA: Diagnosis not present

## 2015-07-12 DIAGNOSIS — R000 Tachycardia, unspecified: Secondary | ICD-10-CM | POA: Diagnosis not present

## 2015-07-15 DIAGNOSIS — J3089 Other allergic rhinitis: Secondary | ICD-10-CM | POA: Diagnosis not present

## 2015-07-15 DIAGNOSIS — J301 Allergic rhinitis due to pollen: Secondary | ICD-10-CM | POA: Diagnosis not present

## 2015-07-18 ENCOUNTER — Encounter: Payer: Self-pay | Admitting: Physical Therapy

## 2015-07-18 ENCOUNTER — Ambulatory Visit: Payer: Medicare Other | Attending: Internal Medicine | Admitting: Physical Therapy

## 2015-07-18 DIAGNOSIS — R42 Dizziness and giddiness: Secondary | ICD-10-CM | POA: Diagnosis not present

## 2015-07-18 NOTE — Therapy (Signed)
[place] MAIN [facility] SERVICES [address] [place], [place], [address] Phone: [phone]   Fax:  [phone]  Physical Therapy Treatment  Patient Details  Name: Vanessa Evans MRN: [id number] Date of Birth: 1946-02-05 No Data Recorded  Encounter Date: 07/18/2015      PT End of Session - 07/18/15 1132    Visit Number 4   Number of Visits 8   Date for PT Re-Evaluation 08/22/15   PT Start Time 9702   PT Stop Time 1129   PT Time Calculation (min) 44 min   Equipment Utilized During Treatment Gait belt   Activity Tolerance Patient tolerated treatment well   Behavior During Therapy [facility] for tasks assessed/performed      Past Medical History  Diagnosis Date  . Dysrhythmia     tachycardia  . Heart murmur     aortic  . Shortness of breath     any exertion, cannot lie flat  . Hypertension   . Asthma     needs rescue inhaler few times a year  . Pneumonia     hx  . GERD (gastroesophageal reflux disease)   . Headache(784.0)   . Migraine   . Dizziness   . Arthritis     osteo  . Sleep apnea      no cpap  weight loss  . Depression   . Diabetes mellitus without complication ([place])   . PONV (postoperative nausea and vomiting)     very anxious during cataract surgery  . Orthopnea     Past Surgical History  Procedure Laterality Date  . Oophorectomy    . Joint replacement Right     knee  . Breast surgery Right     lumpectomy  . Hernia repair    . Colonoscopy w/ biopsies    . Lumbar laminectomy/decompression microdiscectomy Right 03/21/2013    Procedure: Right Lumbar five-Sacral one Laminectomy for Synovial Cyst;  Surgeon: Faythe Ghee, MD;  Location: [place] NEURO ORS;  Service: Neurosurgery;  Laterality: Right;  right  . Cataract extraction w/phaco Left 12/25/2014    Procedure: CATARACT EXTRACTION PHACO AND INTRAOCULAR LENS PLACEMENT (IOC);  Surgeon: Birder Robson, MD;  Location: [facility] ORS;  Service: Ophthalmology;  Laterality:  Left;  [place] 00:32 AP% 20.0 CDE 6.49    There were no vitals filed for this visit.  Visit Diagnosis:  Dizziness and giddiness      Subjective Assessment - 07/18/15 1047    Subjective Pt states she has been getting vertigo while doing the home Epley maneuver. Pt states she has been trying to do the exercises almost every day.    Currently in Pain? Yes   Pain Location Neck   Pain Orientation Left   Pain Type Chronic pain   Pain Relieving Factors Voltaren gel       Neuromuscular Re-education: Pt with negative Bilateral Dix-Hallpike tests this date with no nystagmus observed and pt denied vertigo. Pt performed self Epley maneuver and needed one cue to turn nose towards floor in side-lying position and to keep chin tucked for a few seconds upon initially sitting up.  Worked on 1/2 foam roll flat and then round side down with and without horiz and vert head turns, reaching with UEs while tracking hand with eyes/head. Pt with several losses of balance and relies heavily on UEs to catch her balance. Therefore added arm reaching and balloon toss to self to try to encourage hip and ankle strategy usage. Worked on balloon toss  to self on 1/2 foam roll with flat side down and then on Airex pad with NBOS, feet together and semi-tandem stance with alternate lead leg.  Pt remarked that she has a blue Dynadisc at home. Worked on placing one foot on Dynadisc with other foot in tandem on the floor behind disc and doing static hold and then adding in object self toss side to side. Discussed performing in corner with tall chair in front for safety. Pt verbalized understanding.  Do plan on repeating Dix-Hallpike tests bilaterally and retesting functional outcome measures next visit.           PT Long Term Goals - 07/18/15 1225    PT LONG TERM GOAL #1   Title Patient reports no vertigo with provoking motions or positions.   Time 8   Period Weeks   Status Partially Met   PT LONG TERM GOAL #2   Title  Patient will reduce perceived disability to low levels as indicated by <40 on Dizziness Handicap Inventory.   Baseline 06/27/15: 44 moderate perception of handicap   Time 8   Period Weeks   Status On-going   PT LONG TERM GOAL #3   Title Patient will be able to perform home program independently for self-management.   Time 8   Status Achieved               Plan - 07/18/15 1225    Clinical Impression Statement Patient with negative Dix-Hallpike tests this date bilaterally. Pt reports that she has been trying to do home Epley manuever and reviewed in clinic with cue provided for head position in sidelying. Pt demonstrated difficulty with being able to utilize hip strategy this date and worked on this and balance activities during session. Pt denied dizziness during session. Will plan on retesting functional outcome measures and Dix-hallpike tests next visit.    Pt will benefit from skilled therapeutic intervention in order to improve on the following deficits Decreased balance;Dizziness   Rehab Potential Good   Clinical Impairments Affecting Rehab Potential Positive: motivated  Negative: chronicity   PT Frequency 1x / week   PT Duration 8 weeks   PT Treatment/Interventions Vestibular;Canalith Repostioning;Gait training;Stair training;Therapeutic exercise;Therapeutic activities;Patient/family education;Neuromuscular re-education;Balance training   PT Home Exercise Plan Home Epley for R posterior canal BPPV   Consulted and Agree with Plan of Care Patient        Problem List There are no active problems to display for this patient.  Lady Deutscher PT, DPT Lady Deutscher 07/18/2015, 3:24 PM  [place] MAIN [facility] SERVICES [address] [place], [place], [address] Phone: [phone]   Fax:  [phone]  Name: LUZ MARES MRN: [id number] Date of Birth: 08/10/1946

## 2015-07-23 ENCOUNTER — Encounter: Payer: Self-pay | Admitting: Physical Therapy

## 2015-07-23 ENCOUNTER — Ambulatory Visit: Payer: Medicare Other | Admitting: Physical Therapy

## 2015-07-23 DIAGNOSIS — R42 Dizziness and giddiness: Secondary | ICD-10-CM

## 2015-07-23 NOTE — Therapy (Signed)
[place] MAIN [facility] SERVICES [address] [place], [place], [address] Phone: [phone]   Fax:  [phone]  Physical Therapy Treatment/Discharge Summary  Patient Details  Name: Vanessa Evans MRN: [id number] Date of Birth: 1946-05-06 No Data Recorded  Encounter Date: 07/23/2015      PT End of Session - 07/24/15 0852    Visit Number 5   Number of Visits 8   Date for PT Re-Evaluation 08/22/15   PT Start Time 1005   PT Stop Time 1043   PT Time Calculation (min) 38 min   Activity Tolerance Patient tolerated treatment well   Behavior During Therapy [facility] for tasks assessed/performed      Past Medical History  Diagnosis Date  . Dysrhythmia     tachycardia  . Heart murmur     aortic  . Shortness of breath     any exertion, cannot lie flat  . Hypertension   . Asthma     needs rescue inhaler few times a year  . Pneumonia     hx  . GERD (gastroesophageal reflux disease)   . Headache(784.0)   . Migraine   . Dizziness   . Arthritis     osteo  . Sleep apnea      no cpap  weight loss  . Depression   . Diabetes mellitus without complication ([place])   . PONV (postoperative nausea and vomiting)     very anxious during cataract surgery  . Orthopnea     Past Surgical History  Procedure Laterality Date  . Oophorectomy    . Joint replacement Right     knee  . Breast surgery Right     lumpectomy  . Hernia repair    . Colonoscopy w/ biopsies    . Lumbar laminectomy/decompression microdiscectomy Right 03/21/2013    Procedure: Right Lumbar five-Sacral one Laminectomy for Synovial Cyst;  Surgeon: Faythe Ghee, MD;  Location: [place] NEURO ORS;  Service: Neurosurgery;  Laterality: Right;  right  . Cataract extraction w/phaco Left 12/25/2014    Procedure: CATARACT EXTRACTION PHACO AND INTRAOCULAR LENS PLACEMENT (IOC);  Surgeon: Birder Robson, MD;  Location: [facility] ORS;  Service: Ophthalmology;  Laterality: Left;  [place] 00:32 AP% 20.0 CDE  6.49    There were no vitals filed for this visit.  Visit Diagnosis:  Dizziness and giddiness      Subjective Assessment - 07/23/15 1013    Subjective Pt states she feels it is getting better. Pt states she has not had any dizziness when doing the self Epley maneuvers now. Pt states she feels ready for discharge and is agreement with discharge from PT services at this time.       Neuromuscular Re-education:  Performed  Left and Right Dix-hallpike test and both were negative with no nystagmus observed and pt denied dizziness.    Diona Foley toss to self: Performed ambulation with  ball toss to self vert while turning head and eyes to follow ball.    Walking with head turns: Performed multiple 66' trials each of forwards and retro ambulation with vert head turns by looking up and to the R/L and center with SBA.   Reviewed HEP. Performed repeat of functional outcome measures DGI and DHI and discussed test results and compared to prior testing. Discussed progress towards goals.  Discussed POC and discharge plans. Discussed community balance classes and [employer] program and information handout provided.         PT Long Term Goals -  08/15/2015 0852    PT LONG TERM GOAL #1   Title Patient reports no vertigo with provoking motions or positions.   Time 8   Period Weeks   Status Achieved   PT LONG TERM GOAL #2   Title Patient will reduce perceived disability to low levels as indicated by <40 on Dizziness Handicap Inventory.   Baseline 06/27/15: 44 moderate perception of handicap; 07/23/15 scored low perception of handicap 10   Time 8   Period Weeks   Status Achieved   PT LONG TERM GOAL #3   Title Patient will be able to perform home program independently for self-management.   Time 8   Period Weeks   Status Achieved               Plan - 15-Aug-2015 0853    Clinical Impression Statement Upon retesting, pt with bilateral negative DIx-hallpike tests. Pt reporting significant  improvement in her dizziness symptoms. Pt does still demonstrate mild veering with vertical head turns but denies dizziness and pt was provided with specific exercises to help address this finding. Pt demonstrates good balance safe for community mobility as evidenced by a 22/24 on the DGI. Pt has met all of her goals as set on POC. Pt improved from moderate to low perception of handicap scoring a 10 on the [place] . Pt is independent iwth HEP and was provided with handout in regards to [employer] program. Pt in agreement with discharge from PT services at this time.    Pt will benefit from skilled therapeutic intervention in order to improve on the following deficits Decreased balance;Dizziness   Rehab Potential Good   Clinical Impairments Affecting Rehab Potential Positive: motivated  Negative: chronicity   PT Frequency 1x / week   PT Duration 8 weeks   PT Treatment/Interventions Vestibular;Canalith Repostioning;Gait training;Stair training;Therapeutic exercise;Therapeutic activities;Patient/family education;Neuromuscular re-education;Balance training   PT Home Exercise Plan Home Epley for R posterior canal BPPV; ambulation with vertical head turns and ambulation with vertical ball toss to self   Consulted and Agree with Plan of Care Patient          G-Codes - August 15, 2015 0859    Functional Assessment Tool Used clinical judgment, DHI, DGI   Functional Limitation Mobility: Walking and moving around   Mobility: Walking and Moving Around Goal Status [phone]) At least 1 percent but less than 20 percent impaired, limited or restricted   Mobility: Walking and Moving Around Discharge Status [phone]) At least 1 percent but less than 20 percent impaired, limited or restricted      Problem List There are no active problems to display for this patient.  Lady Deutscher PT, DPT Murphy,Dorriea 08/15/15, 9:04 AM  [place] MAIN [facility] SERVICES [address]  [place], [place], [address] Phone: [phone]   Fax:  [phone]  Name: Vanessa Evans MRN: [id number] Date of Birth: 01-Dec-1945

## 2015-07-29 ENCOUNTER — Encounter: Payer: Medicare Other | Admitting: Physical Therapy

## 2015-07-29 DIAGNOSIS — J301 Allergic rhinitis due to pollen: Secondary | ICD-10-CM | POA: Diagnosis not present

## 2015-07-29 DIAGNOSIS — J3089 Other allergic rhinitis: Secondary | ICD-10-CM | POA: Diagnosis not present

## 2015-07-30 DIAGNOSIS — J3089 Other allergic rhinitis: Secondary | ICD-10-CM | POA: Diagnosis not present

## 2015-07-30 DIAGNOSIS — J301 Allergic rhinitis due to pollen: Secondary | ICD-10-CM | POA: Diagnosis not present

## 2015-08-11 HISTORY — PX: VAGINAL HYSTERECTOMY: SUR661

## 2015-08-13 DIAGNOSIS — J449 Chronic obstructive pulmonary disease, unspecified: Secondary | ICD-10-CM | POA: Diagnosis not present

## 2015-08-13 DIAGNOSIS — K219 Gastro-esophageal reflux disease without esophagitis: Secondary | ICD-10-CM | POA: Diagnosis not present

## 2015-08-13 DIAGNOSIS — J301 Allergic rhinitis due to pollen: Secondary | ICD-10-CM | POA: Diagnosis not present

## 2015-08-13 DIAGNOSIS — J3089 Other allergic rhinitis: Secondary | ICD-10-CM | POA: Diagnosis not present

## 2015-08-26 DIAGNOSIS — J301 Allergic rhinitis due to pollen: Secondary | ICD-10-CM | POA: Diagnosis not present

## 2015-08-26 DIAGNOSIS — J3089 Other allergic rhinitis: Secondary | ICD-10-CM | POA: Diagnosis not present

## 2015-08-26 DIAGNOSIS — E039 Hypothyroidism, unspecified: Secondary | ICD-10-CM | POA: Diagnosis not present

## 2015-09-23 DIAGNOSIS — E042 Nontoxic multinodular goiter: Secondary | ICD-10-CM | POA: Diagnosis not present

## 2015-09-23 DIAGNOSIS — I10 Essential (primary) hypertension: Secondary | ICD-10-CM | POA: Diagnosis not present

## 2015-09-23 DIAGNOSIS — M25552 Pain in left hip: Secondary | ICD-10-CM | POA: Diagnosis not present

## 2015-09-23 DIAGNOSIS — J301 Allergic rhinitis due to pollen: Secondary | ICD-10-CM | POA: Diagnosis not present

## 2015-09-23 DIAGNOSIS — J3089 Other allergic rhinitis: Secondary | ICD-10-CM | POA: Diagnosis not present

## 2015-09-23 DIAGNOSIS — R5383 Other fatigue: Secondary | ICD-10-CM | POA: Diagnosis not present

## 2015-10-07 DIAGNOSIS — J3089 Other allergic rhinitis: Secondary | ICD-10-CM | POA: Diagnosis not present

## 2015-10-07 DIAGNOSIS — H43813 Vitreous degeneration, bilateral: Secondary | ICD-10-CM | POA: Diagnosis not present

## 2015-10-07 DIAGNOSIS — J301 Allergic rhinitis due to pollen: Secondary | ICD-10-CM | POA: Diagnosis not present

## 2015-10-16 DIAGNOSIS — J4540 Moderate persistent asthma, uncomplicated: Secondary | ICD-10-CM | POA: Diagnosis not present

## 2015-10-21 DIAGNOSIS — J3089 Other allergic rhinitis: Secondary | ICD-10-CM | POA: Diagnosis not present

## 2015-10-21 DIAGNOSIS — J301 Allergic rhinitis due to pollen: Secondary | ICD-10-CM | POA: Diagnosis not present

## 2015-10-21 DIAGNOSIS — J4540 Moderate persistent asthma, uncomplicated: Secondary | ICD-10-CM | POA: Diagnosis not present

## 2015-10-24 DIAGNOSIS — M7062 Trochanteric bursitis, left hip: Secondary | ICD-10-CM | POA: Diagnosis not present

## 2015-10-24 DIAGNOSIS — M25552 Pain in left hip: Secondary | ICD-10-CM | POA: Diagnosis not present

## 2015-10-24 DIAGNOSIS — G8929 Other chronic pain: Secondary | ICD-10-CM | POA: Diagnosis not present

## 2015-10-28 DIAGNOSIS — J301 Allergic rhinitis due to pollen: Secondary | ICD-10-CM | POA: Diagnosis not present

## 2015-10-28 DIAGNOSIS — J3089 Other allergic rhinitis: Secondary | ICD-10-CM | POA: Diagnosis not present

## 2015-11-04 DIAGNOSIS — J301 Allergic rhinitis due to pollen: Secondary | ICD-10-CM | POA: Diagnosis not present

## 2015-11-04 DIAGNOSIS — J3089 Other allergic rhinitis: Secondary | ICD-10-CM | POA: Diagnosis not present

## 2015-11-18 DIAGNOSIS — I471 Supraventricular tachycardia: Secondary | ICD-10-CM | POA: Diagnosis not present

## 2015-11-18 DIAGNOSIS — R000 Tachycardia, unspecified: Secondary | ICD-10-CM | POA: Diagnosis not present

## 2015-11-18 DIAGNOSIS — I10 Essential (primary) hypertension: Secondary | ICD-10-CM | POA: Diagnosis not present

## 2015-11-18 DIAGNOSIS — E782 Mixed hyperlipidemia: Secondary | ICD-10-CM | POA: Diagnosis not present

## 2015-11-18 DIAGNOSIS — J3089 Other allergic rhinitis: Secondary | ICD-10-CM | POA: Diagnosis not present

## 2015-11-18 DIAGNOSIS — J301 Allergic rhinitis due to pollen: Secondary | ICD-10-CM | POA: Diagnosis not present

## 2015-12-03 DIAGNOSIS — K529 Noninfective gastroenteritis and colitis, unspecified: Secondary | ICD-10-CM | POA: Diagnosis not present

## 2015-12-03 DIAGNOSIS — J3089 Other allergic rhinitis: Secondary | ICD-10-CM | POA: Diagnosis not present

## 2015-12-03 DIAGNOSIS — J301 Allergic rhinitis due to pollen: Secondary | ICD-10-CM | POA: Diagnosis not present

## 2015-12-10 DIAGNOSIS — E039 Hypothyroidism, unspecified: Secondary | ICD-10-CM | POA: Diagnosis not present

## 2015-12-10 DIAGNOSIS — R5383 Other fatigue: Secondary | ICD-10-CM | POA: Diagnosis not present

## 2015-12-16 DIAGNOSIS — J301 Allergic rhinitis due to pollen: Secondary | ICD-10-CM | POA: Diagnosis not present

## 2015-12-16 DIAGNOSIS — R4189 Other symptoms and signs involving cognitive functions and awareness: Secondary | ICD-10-CM | POA: Diagnosis not present

## 2015-12-16 DIAGNOSIS — J3089 Other allergic rhinitis: Secondary | ICD-10-CM | POA: Diagnosis not present

## 2015-12-24 ENCOUNTER — Other Ambulatory Visit: Payer: Self-pay | Admitting: Obstetrics & Gynecology

## 2015-12-24 DIAGNOSIS — Z1231 Encounter for screening mammogram for malignant neoplasm of breast: Secondary | ICD-10-CM

## 2015-12-30 DIAGNOSIS — J3089 Other allergic rhinitis: Secondary | ICD-10-CM | POA: Diagnosis not present

## 2015-12-30 DIAGNOSIS — J301 Allergic rhinitis due to pollen: Secondary | ICD-10-CM | POA: Diagnosis not present

## 2016-01-10 ENCOUNTER — Ambulatory Visit
Admission: RE | Admit: 2016-01-10 | Discharge: 2016-01-10 | Disposition: A | Discharge status: Home / Self Care | Payer: Medicare Other | Source: Ambulatory Visit | Attending: Obstetrics & Gynecology | Admitting: Obstetrics & Gynecology

## 2016-01-10 ENCOUNTER — Other Ambulatory Visit: Payer: Self-pay | Admitting: Obstetrics & Gynecology

## 2016-01-10 DIAGNOSIS — Z1231 Encounter for screening mammogram for malignant neoplasm of breast: Secondary | ICD-10-CM | POA: Diagnosis not present

## 2016-01-13 DIAGNOSIS — J301 Allergic rhinitis due to pollen: Secondary | ICD-10-CM | POA: Diagnosis not present

## 2016-01-13 DIAGNOSIS — J3089 Other allergic rhinitis: Secondary | ICD-10-CM | POA: Diagnosis not present

## 2016-01-13 DIAGNOSIS — Z01419 Encounter for gynecological examination (general) (routine) without abnormal findings: Secondary | ICD-10-CM | POA: Diagnosis not present

## 2016-01-13 DIAGNOSIS — N8110 Cystocele, unspecified: Secondary | ICD-10-CM | POA: Diagnosis not present

## 2016-01-13 DIAGNOSIS — N814 Uterovaginal prolapse, unspecified: Secondary | ICD-10-CM | POA: Diagnosis not present

## 2016-01-13 DIAGNOSIS — N952 Postmenopausal atrophic vaginitis: Secondary | ICD-10-CM | POA: Diagnosis not present

## 2016-01-24 DIAGNOSIS — N814 Uterovaginal prolapse, unspecified: Secondary | ICD-10-CM | POA: Diagnosis not present

## 2016-01-24 DIAGNOSIS — N8110 Cystocele, unspecified: Secondary | ICD-10-CM | POA: Diagnosis not present

## 2016-01-27 DIAGNOSIS — N819 Female genital prolapse, unspecified: Secondary | ICD-10-CM | POA: Diagnosis not present

## 2016-01-27 DIAGNOSIS — R197 Diarrhea, unspecified: Secondary | ICD-10-CM | POA: Diagnosis not present

## 2016-01-27 DIAGNOSIS — J3089 Other allergic rhinitis: Secondary | ICD-10-CM | POA: Diagnosis not present

## 2016-01-27 DIAGNOSIS — J4540 Moderate persistent asthma, uncomplicated: Secondary | ICD-10-CM | POA: Diagnosis not present

## 2016-01-27 DIAGNOSIS — E042 Nontoxic multinodular goiter: Secondary | ICD-10-CM | POA: Diagnosis not present

## 2016-01-27 DIAGNOSIS — R5383 Other fatigue: Secondary | ICD-10-CM | POA: Diagnosis not present

## 2016-01-27 DIAGNOSIS — J301 Allergic rhinitis due to pollen: Secondary | ICD-10-CM | POA: Diagnosis not present

## 2016-02-10 DIAGNOSIS — J301 Allergic rhinitis due to pollen: Secondary | ICD-10-CM | POA: Diagnosis not present

## 2016-02-10 DIAGNOSIS — J3089 Other allergic rhinitis: Secondary | ICD-10-CM | POA: Diagnosis not present

## 2016-02-22 DIAGNOSIS — G4710 Hypersomnia, unspecified: Secondary | ICD-10-CM | POA: Diagnosis not present

## 2016-02-24 DIAGNOSIS — N8111 Cystocele, midline: Secondary | ICD-10-CM | POA: Diagnosis not present

## 2016-02-24 DIAGNOSIS — J3089 Other allergic rhinitis: Secondary | ICD-10-CM | POA: Diagnosis not present

## 2016-02-24 DIAGNOSIS — M7062 Trochanteric bursitis, left hip: Secondary | ICD-10-CM | POA: Diagnosis not present

## 2016-02-24 DIAGNOSIS — N812 Incomplete uterovaginal prolapse: Secondary | ICD-10-CM | POA: Diagnosis not present

## 2016-02-24 DIAGNOSIS — J301 Allergic rhinitis due to pollen: Secondary | ICD-10-CM | POA: Diagnosis not present

## 2016-02-24 DIAGNOSIS — M19011 Primary osteoarthritis, right shoulder: Secondary | ICD-10-CM | POA: Diagnosis not present

## 2016-02-24 DIAGNOSIS — M25511 Pain in right shoulder: Secondary | ICD-10-CM | POA: Diagnosis not present

## 2016-03-09 DIAGNOSIS — J4540 Moderate persistent asthma, uncomplicated: Secondary | ICD-10-CM | POA: Diagnosis not present

## 2016-03-09 DIAGNOSIS — J301 Allergic rhinitis due to pollen: Secondary | ICD-10-CM | POA: Diagnosis not present

## 2016-03-09 DIAGNOSIS — R0902 Hypoxemia: Secondary | ICD-10-CM | POA: Diagnosis not present

## 2016-03-11 DIAGNOSIS — J3089 Other allergic rhinitis: Secondary | ICD-10-CM | POA: Diagnosis not present

## 2016-03-11 DIAGNOSIS — J449 Chronic obstructive pulmonary disease, unspecified: Secondary | ICD-10-CM | POA: Diagnosis not present

## 2016-03-11 DIAGNOSIS — J301 Allergic rhinitis due to pollen: Secondary | ICD-10-CM | POA: Diagnosis not present

## 2016-03-12 DIAGNOSIS — J449 Chronic obstructive pulmonary disease, unspecified: Secondary | ICD-10-CM | POA: Diagnosis not present

## 2016-03-20 DIAGNOSIS — M25512 Pain in left shoulder: Secondary | ICD-10-CM | POA: Diagnosis not present

## 2016-03-20 DIAGNOSIS — M19011 Primary osteoarthritis, right shoulder: Secondary | ICD-10-CM | POA: Diagnosis not present

## 2016-03-20 DIAGNOSIS — M7542 Impingement syndrome of left shoulder: Secondary | ICD-10-CM | POA: Diagnosis not present

## 2016-03-23 DIAGNOSIS — R252 Cramp and spasm: Secondary | ICD-10-CM | POA: Diagnosis not present

## 2016-03-23 DIAGNOSIS — J3089 Other allergic rhinitis: Secondary | ICD-10-CM | POA: Diagnosis not present

## 2016-03-23 DIAGNOSIS — J4540 Moderate persistent asthma, uncomplicated: Secondary | ICD-10-CM | POA: Diagnosis not present

## 2016-03-23 DIAGNOSIS — J301 Allergic rhinitis due to pollen: Secondary | ICD-10-CM | POA: Diagnosis not present

## 2016-03-23 DIAGNOSIS — R0902 Hypoxemia: Secondary | ICD-10-CM | POA: Diagnosis not present

## 2016-03-23 DIAGNOSIS — F330 Major depressive disorder, recurrent, mild: Secondary | ICD-10-CM | POA: Diagnosis not present

## 2016-03-23 DIAGNOSIS — N819 Female genital prolapse, unspecified: Secondary | ICD-10-CM | POA: Diagnosis not present

## 2016-04-02 ENCOUNTER — Other Ambulatory Visit: Payer: Medicare Other

## 2016-04-03 ENCOUNTER — Encounter
Admission: RE | Admit: 2016-04-03 | Discharge: 2016-04-03 | Disposition: A | Discharge status: Home / Self Care | Payer: Medicare Other | Source: Ambulatory Visit | Attending: Obstetrics & Gynecology | Admitting: Obstetrics & Gynecology

## 2016-04-03 DIAGNOSIS — Z01812 Encounter for preprocedural laboratory examination: Secondary | ICD-10-CM | POA: Insufficient documentation

## 2016-04-03 DIAGNOSIS — N8111 Cystocele, midline: Secondary | ICD-10-CM | POA: Diagnosis not present

## 2016-04-03 DIAGNOSIS — Z01810 Encounter for preprocedural cardiovascular examination: Secondary | ICD-10-CM | POA: Diagnosis not present

## 2016-04-03 DIAGNOSIS — I10 Essential (primary) hypertension: Secondary | ICD-10-CM | POA: Diagnosis not present

## 2016-04-03 DIAGNOSIS — N812 Incomplete uterovaginal prolapse: Secondary | ICD-10-CM | POA: Diagnosis not present

## 2016-04-03 DIAGNOSIS — E785 Hyperlipidemia, unspecified: Secondary | ICD-10-CM | POA: Diagnosis not present

## 2016-04-03 DIAGNOSIS — R000 Tachycardia, unspecified: Secondary | ICD-10-CM | POA: Insufficient documentation

## 2016-04-03 HISTORY — DX: Hyperlipidemia, unspecified: E78.5

## 2016-04-03 LAB — BASIC METABOLIC PANEL
Anion gap: 7 (ref 5–15)
BUN: 16 mg/dL (ref 6–20)
CALCIUM: 9.4 mg/dL (ref 8.9–10.3)
CO2: 29 mmol/L (ref 22–32)
CREATININE: 0.54 mg/dL (ref 0.44–1.00)
Chloride: 105 mmol/L (ref 101–111)
GFR calc Af Amer: >60 mL/min (ref >60)
GFR calc non Af Amer: >60 mL/min (ref >60)
GLUCOSE: 99 mg/dL (ref 65–99)
Potassium: 3 mmol/L — ABNORMAL LOW (ref 3.5–5.1)
SODIUM: 141 mmol/L (ref 135–145)

## 2016-04-03 LAB — APTT: aPTT: 27 seconds (ref 24–36)

## 2016-04-03 LAB — CBC
HCT: 42.1 % (ref 35.0–47.0)
Hemoglobin: 14.2 g/dL (ref 12.0–16.0)
MCH: 29.4 pg (ref 26.0–34.0)
MCHC: 33.7 g/dL (ref 32.0–36.0)
MCV: 87.4 fL (ref 80.0–100.0)
PLATELETS: 271 10*3/uL (ref 150–440)
RBC: 4.82 MIL/uL (ref 3.80–5.20)
RDW: 14 % (ref 11.5–14.5)
WBC: 6.9 10*3/uL (ref 3.6–11.0)

## 2016-04-03 LAB — PROTIME-INR
INR: 1.01
PROTHROMBIN TIME: 13.3 s (ref 11.4–15.2)

## 2016-04-03 LAB — TYPE AND SCREEN
ABO/RH(D): A POS
Antibody Screen: NEGATIVE

## 2016-04-03 LAB — SURGICAL PCR SCREEN
MRSA, PCR: NEGATIVE
Staphylococcus aureus: NEGATIVE

## 2016-04-03 NOTE — Patient Instructions (Signed)
  Your procedure is scheduled [id number] 31, 2017 (Thursday) Report to Same Day Surgery 2nd floor medical mall To find out your arrival time please call [phone] between 1PM - 3PM on April 08, 2016  (Wednesday)  Remember: Instructions that are not followed completely may result in serious medical risk, up to and including death, or upon the discretion of your surgeon and anesthesiologist your surgery may need to be rescheduled.    _x___ 1. Do not eat food or drink liquids after midnight. No gum chewing or hard candies.     __x__ 2. No Alcohol for 24 hours before or after surgery.   __x__3. No Smoking for 24 prior to surgery.   ____  4. Bring all medications with you on the day of surgery if instructed.    __x__ 5. Notify your doctor if there is any change in your medical condition     (cold, fever, infections).     Do not wear jewelry, make-up, hairpins, clips or nail polish.  Do not wear lotions, powders, or perfumes. You may wear deodorant.  Do not shave 48 hours prior to surgery. Men may shave face and neck.  Do not bring valuables to the hospital.    [facility] is not responsible for any belongings or valuables.               Contacts, dentures or bridgework may not be worn into surgery.  Leave your suitcase in the car. After surgery it may be brought to your room.  For patients admitted to the hospital, discharge time is determined by your treatment team.   Patients discharged the day of surgery will not be allowed to drive home.    Please read over the following fact sheets that you were given:   [facility] Preparing for Surgery and or MRSA Information   _x___ Take these medicines the morning of surgery with A SIP OF WATER:    1. Pantoprazole (Pantoprazole at bedtime on Wednesday night)  2.  3.  4.  5.  6.  ____ Fleet Enema (as directed)   _x___ Use CHG Soap or sage wipes as directed on instruction sheet   _x___ Use inhalers on the day of surgery and bring  to hospital day of surgery (Use Ventolin inhaler the morning of surgery and bring to hospital)  ____ Stop metformin 2 days prior to surgery    ____ Take 1/2 of usual insulin dose the night before surgery and none on the morning of surgery.           _x___ Stop aspirin or coumadin, or plavix (Patient stopped Aspirin one week ago)  _x__ Stop Anti-inflammatories such as Advil, Aleve, Ibuprofen, Motrin, Naproxen,          Naprosyn, Goodies powders or aspirin products. (Stop Diclofenac gel now) Ok to take Tylenol.   _x___ Stop supplements until after surgery.  (STOP ALL SUPPLEMENTS NOW)  ____ Bring C-Pap to the hospital.

## 2016-04-03 NOTE — Pre-Procedure Instructions (Signed)
Potassium results (3.0)  called and faxed to Dr. Kenton Kingfisher office, left message with Denyse Dago.

## 2016-04-06 DIAGNOSIS — J301 Allergic rhinitis due to pollen: Secondary | ICD-10-CM | POA: Diagnosis not present

## 2016-04-06 DIAGNOSIS — J3089 Other allergic rhinitis: Secondary | ICD-10-CM | POA: Diagnosis not present

## 2016-04-09 ENCOUNTER — Encounter
Admission: RE | Disposition: A | Discharge status: Home / Self Care | Payer: Self-pay | Source: Ambulatory Visit | Attending: Obstetrics & Gynecology

## 2016-04-09 ENCOUNTER — Ambulatory Visit: Payer: Medicare Other | Admitting: Anesthesiology

## 2016-04-09 ENCOUNTER — Observation Stay
Admission: EM | Admit: 2016-04-09 | Discharge: 2016-04-10 | Disposition: A | Discharge status: Home / Self Care | Payer: Medicare Other | Attending: Obstetrics & Gynecology | Admitting: Obstetrics & Gynecology

## 2016-04-09 ENCOUNTER — Encounter: Payer: Self-pay | Admitting: Anesthesiology

## 2016-04-09 ENCOUNTER — Encounter: Payer: Self-pay | Admitting: *Deleted

## 2016-04-09 ENCOUNTER — Ambulatory Visit
Admission: RE | Admit: 2016-04-09 | Discharge: 2016-04-09 | Disposition: A | Discharge status: Home / Self Care | Payer: Medicare Other | Source: Ambulatory Visit | Attending: Obstetrics & Gynecology | Admitting: Obstetrics & Gynecology

## 2016-04-09 DIAGNOSIS — D259 Leiomyoma of uterus, unspecified: Secondary | ICD-10-CM | POA: Insufficient documentation

## 2016-04-09 DIAGNOSIS — R338 Other retention of urine: Principal | ICD-10-CM | POA: Insufficient documentation

## 2016-04-09 DIAGNOSIS — Z7951 Long term (current) use of inhaled steroids: Secondary | ICD-10-CM | POA: Insufficient documentation

## 2016-04-09 DIAGNOSIS — R1030 Lower abdominal pain, unspecified: Secondary | ICD-10-CM | POA: Diagnosis present

## 2016-04-09 DIAGNOSIS — IMO0002 Reserved for concepts with insufficient information to code with codable children: Secondary | ICD-10-CM | POA: Diagnosis present

## 2016-04-09 DIAGNOSIS — F419 Anxiety disorder, unspecified: Secondary | ICD-10-CM | POA: Diagnosis not present

## 2016-04-09 DIAGNOSIS — N8111 Cystocele, midline: Secondary | ICD-10-CM | POA: Diagnosis not present

## 2016-04-09 DIAGNOSIS — F329 Major depressive disorder, single episode, unspecified: Secondary | ICD-10-CM | POA: Insufficient documentation

## 2016-04-09 DIAGNOSIS — N814 Uterovaginal prolapse, unspecified: Secondary | ICD-10-CM | POA: Diagnosis present

## 2016-04-09 DIAGNOSIS — Z791 Long term (current) use of non-steroidal anti-inflammatories (NSAID): Secondary | ICD-10-CM | POA: Insufficient documentation

## 2016-04-09 DIAGNOSIS — E871 Hypo-osmolality and hyponatremia: Secondary | ICD-10-CM | POA: Diagnosis present

## 2016-04-09 DIAGNOSIS — Z79899 Other long term (current) drug therapy: Secondary | ICD-10-CM | POA: Diagnosis not present

## 2016-04-09 DIAGNOSIS — Z87891 Personal history of nicotine dependence: Secondary | ICD-10-CM | POA: Diagnosis not present

## 2016-04-09 DIAGNOSIS — M1990 Unspecified osteoarthritis, unspecified site: Secondary | ICD-10-CM | POA: Insufficient documentation

## 2016-04-09 DIAGNOSIS — N838 Other noninflammatory disorders of ovary, fallopian tube and broad ligament: Secondary | ICD-10-CM | POA: Insufficient documentation

## 2016-04-09 DIAGNOSIS — R339 Retention of urine, unspecified: Secondary | ICD-10-CM | POA: Diagnosis present

## 2016-04-09 DIAGNOSIS — G8918 Other acute postprocedural pain: Secondary | ICD-10-CM | POA: Diagnosis not present

## 2016-04-09 DIAGNOSIS — Z7982 Long term (current) use of aspirin: Secondary | ICD-10-CM | POA: Diagnosis not present

## 2016-04-09 DIAGNOSIS — E785 Hyperlipidemia, unspecified: Secondary | ICD-10-CM | POA: Insufficient documentation

## 2016-04-09 DIAGNOSIS — K219 Gastro-esophageal reflux disease without esophagitis: Secondary | ICD-10-CM | POA: Diagnosis not present

## 2016-04-09 DIAGNOSIS — N8189 Other female genital prolapse: Secondary | ICD-10-CM | POA: Diagnosis not present

## 2016-04-09 DIAGNOSIS — I10 Essential (primary) hypertension: Secondary | ICD-10-CM | POA: Diagnosis not present

## 2016-04-09 DIAGNOSIS — N184 Chronic kidney disease, stage 4 (severe): Secondary | ICD-10-CM | POA: Diagnosis not present

## 2016-04-09 DIAGNOSIS — G43909 Migraine, unspecified, not intractable, without status migrainosus: Secondary | ICD-10-CM | POA: Insufficient documentation

## 2016-04-09 DIAGNOSIS — J45909 Unspecified asthma, uncomplicated: Secondary | ICD-10-CM | POA: Diagnosis not present

## 2016-04-09 DIAGNOSIS — N72 Inflammatory disease of cervix uteri: Secondary | ICD-10-CM | POA: Insufficient documentation

## 2016-04-09 DIAGNOSIS — N8110 Cystocele, unspecified: Secondary | ICD-10-CM | POA: Diagnosis not present

## 2016-04-09 DIAGNOSIS — N812 Incomplete uterovaginal prolapse: Secondary | ICD-10-CM | POA: Diagnosis not present

## 2016-04-09 HISTORY — PX: CYSTOCELE REPAIR: SHX163

## 2016-04-09 HISTORY — PX: UNILATERAL SALPINGECTOMY: SHX6160

## 2016-04-09 HISTORY — PX: VAGINAL HYSTERECTOMY: SHX2639

## 2016-04-09 LAB — COMPREHENSIVE METABOLIC PANEL
ALT: 26 U/L (ref 14–54)
AST: 32 U/L (ref 15–41)
Albumin: 4.1 g/dL (ref 3.5–5.0)
Alkaline Phosphatase: 24 U/L — ABNORMAL LOW (ref 38–126)
Anion gap: 10 (ref 5–15)
BILIRUBIN TOTAL: 0.4 mg/dL (ref 0.3–1.2)
BUN: 17 mg/dL (ref 6–20)
CO2: 22 mmol/L (ref 22–32)
CREATININE: 0.62 mg/dL (ref 0.44–1.00)
Calcium: 8.7 mg/dL — ABNORMAL LOW (ref 8.9–10.3)
Chloride: 95 mmol/L — ABNORMAL LOW (ref 101–111)
GFR calc Af Amer: >60 mL/min (ref >60)
Glucose, Bld: 179 mg/dL — ABNORMAL HIGH (ref 65–99)
POTASSIUM: 4 mmol/L (ref 3.5–5.1)
Sodium: 127 mmol/L — ABNORMAL LOW (ref 135–145)
TOTAL PROTEIN: 6.5 g/dL (ref 6.5–8.1)

## 2016-04-09 LAB — URINALYSIS COMPLETE WITH MICROSCOPIC (ARMC ONLY)
BILIRUBIN URINE: NEGATIVE
Bacteria, UA: NONE SEEN
GLUCOSE, UA: 50 mg/dL — AB
HGB URINE DIPSTICK: NEGATIVE
KETONES UR: NEGATIVE mg/dL
LEUKOCYTES UA: NEGATIVE
NITRITE: NEGATIVE
Protein, ur: NEGATIVE mg/dL
RBC / HPF: NONE SEEN RBC/hpf (ref 0–5)
SPECIFIC GRAVITY, URINE: 1.01 (ref 1.005–1.030)
Squamous Epithelial / LPF: NONE SEEN
pH: 7 (ref 5.0–8.0)

## 2016-04-09 LAB — BASIC METABOLIC PANEL
Anion gap: 8 (ref 5–15)
BUN: 16 mg/dL (ref 6–20)
CHLORIDE: 95 mmol/L — AB (ref 101–111)
CO2: 24 mmol/L (ref 22–32)
CREATININE: 0.61 mg/dL (ref 0.44–1.00)
Calcium: 8.8 mg/dL — ABNORMAL LOW (ref 8.9–10.3)
GFR calc Af Amer: >60 mL/min (ref >60)
GFR calc non Af Amer: >60 mL/min (ref >60)
Glucose, Bld: 133 mg/dL — ABNORMAL HIGH (ref 65–99)
Potassium: 3.9 mmol/L (ref 3.5–5.1)
Sodium: 127 mmol/L — ABNORMAL LOW (ref 135–145)

## 2016-04-09 LAB — CBC
HEMATOCRIT: 36.8 % (ref 35.0–47.0)
Hemoglobin: 12.3 g/dL (ref 12.0–16.0)
MCH: 29.6 pg (ref 26.0–34.0)
MCHC: 33.3 g/dL (ref 32.0–36.0)
MCV: 88.9 fL (ref 80.0–100.0)
PLATELETS: 250 10*3/uL (ref 150–440)
RBC: 4.14 MIL/uL (ref 3.80–5.20)
RDW: 14.1 % (ref 11.5–14.5)
WBC: 13.5 10*3/uL — AB (ref 3.6–11.0)

## 2016-04-09 LAB — POCT I-STAT 4, (NA,K, GLUC, HGB,HCT)
Glucose, Bld: 84 mg/dL (ref 65–99)
HEMATOCRIT: 43 % (ref 36.0–46.0)
Hemoglobin: 14.6 g/dL (ref 12.0–15.0)
Potassium: 3.6 mmol/L (ref 3.5–5.1)
Sodium: 141 mmol/L (ref 135–145)

## 2016-04-09 SURGERY — HYSTERECTOMY, VAGINAL
Anesthesia: General

## 2016-04-09 MED ORDER — FENTANYL CITRATE (PF) 100 MCG/2ML IJ SOLN
25.0000 ug | INTRAMUSCULAR | Status: DC | PRN
  Administered 2016-04-09: 25 ug via INTRAVENOUS
  Administered 2016-04-09: 50 ug via INTRAVENOUS
  Administered 2016-04-09: 25 ug via INTRAVENOUS

## 2016-04-09 MED ORDER — CEFOXITIN SODIUM-DEXTROSE 2-2.2 GM-% IV SOLR (PREMIX)
INTRAVENOUS | Status: AC
  Filled 2016-04-09: qty 50

## 2016-04-09 MED ORDER — FENTANYL CITRATE (PF) 100 MCG/2ML IJ SOLN
INTRAMUSCULAR | Status: DC | PRN
  Administered 2016-04-09 (×2): 50 ug via INTRAVENOUS

## 2016-04-09 MED ORDER — ACETAMINOPHEN 325 MG PO TABS: 650.0000 mg | ORAL_TABLET | ORAL | Status: DC | PRN

## 2016-04-09 MED ORDER — ACETAMINOPHEN 650 MG RE SUPP
650.0000 mg | RECTAL | Status: DC | PRN
  Filled 2016-04-09: qty 1

## 2016-04-09 MED ORDER — HYDROMORPHONE HCL 2 MG PO TABS
2.0000 mg | ORAL_TABLET | ORAL | Status: DC | PRN
  Administered 2016-04-10 (×3): 2 mg via ORAL
  Filled 2016-04-09 (×3): qty 1

## 2016-04-09 MED ORDER — FENTANYL CITRATE (PF) 100 MCG/2ML IJ SOLN
INTRAMUSCULAR | Status: AC
  Filled 2016-04-09: qty 2

## 2016-04-09 MED ORDER — LIDOCAINE HCL (CARDIAC) 20 MG/ML IV SOLN
INTRAVENOUS | Status: DC | PRN
  Administered 2016-04-09: 60 mg via INTRAVENOUS

## 2016-04-09 MED ORDER — DOCUSATE SODIUM 100 MG PO CAPS
100.0000 mg | ORAL_CAPSULE | Freq: Every evening | ORAL | Status: DC | PRN
  Administered 2016-04-10: 100 mg via ORAL
  Filled 2016-04-09: qty 1

## 2016-04-09 MED ORDER — ACETAMINOPHEN 10 MG/ML IV SOLN
INTRAVENOUS | Status: DC | PRN
  Administered 2016-04-09: 1000 mg via INTRAVENOUS

## 2016-04-09 MED ORDER — ROCURONIUM BROMIDE 100 MG/10ML IV SOLN
INTRAVENOUS | Status: DC | PRN
  Administered 2016-04-09: 10 mg via INTRAVENOUS
  Administered 2016-04-09: 30 mg via INTRAVENOUS

## 2016-04-09 MED ORDER — LEVOTHYROXINE SODIUM 50 MCG PO TABS
75.0000 ug | ORAL_TABLET | Freq: Every day | ORAL | Status: DC
  Administered 2016-04-10: 75 ug via ORAL
  Filled 2016-04-09 (×2): qty 1

## 2016-04-09 MED ORDER — LIDOCAINE-EPINEPHRINE 1 %-1:100000 IJ SOLN
INTRAMUSCULAR | Status: DC | PRN
  Administered 2016-04-09: 10 mL
  Administered 2016-04-09: 2 mL

## 2016-04-09 MED ORDER — ACETAMINOPHEN 10 MG/ML IV SOLN
INTRAVENOUS | Status: AC
Start: 2016-04-09 — End: 2016-04-09
  Filled 2016-04-09: qty 100

## 2016-04-09 MED ORDER — DEXAMETHASONE SODIUM PHOSPHATE 10 MG/ML IJ SOLN
INTRAMUSCULAR | Status: DC | PRN
  Administered 2016-04-09: 10 mg via INTRAVENOUS

## 2016-04-09 MED ORDER — GLYCOPYRROLATE 0.2 MG/ML IJ SOLN
INTRAMUSCULAR | Status: DC | PRN
  Administered 2016-04-09: .4 mg via INTRAVENOUS

## 2016-04-09 MED ORDER — ONDANSETRON HCL 4 MG/2ML IJ SOLN
INTRAMUSCULAR | Status: AC
  Administered 2016-04-09: 4 mg via INTRAVENOUS
  Filled 2016-04-09: qty 2

## 2016-04-09 MED ORDER — HYDROMORPHONE HCL 2 MG PO TABS: 2.0000 mg | ORAL_TABLET | ORAL | 0 refills | Status: DC | PRN

## 2016-04-09 MED ORDER — MIDAZOLAM HCL 2 MG/2ML IJ SOLN
INTRAMUSCULAR | Status: DC | PRN
  Administered 2016-04-09: 1 mg via INTRAVENOUS

## 2016-04-09 MED ORDER — SULFANILAMIDE 15 % VA CREA
TOPICAL_CREAM | VAGINAL | Status: AC
  Filled 2016-04-09: qty 120

## 2016-04-09 MED ORDER — SODIUM CHLORIDE 0.9 % IV BOLUS (SEPSIS)
1000.0000 mL | Freq: Once | INTRAVENOUS | Status: AC
  Administered 2016-04-09: 1000 mL via INTRAVENOUS

## 2016-04-09 MED ORDER — SULFANILAMIDE 15 % VA CREA
TOPICAL_CREAM | VAGINAL | Status: DC | PRN
  Administered 2016-04-09: 1 via VAGINAL

## 2016-04-09 MED ORDER — SUCCINYLCHOLINE CHLORIDE 20 MG/ML IJ SOLN
INTRAMUSCULAR | Status: DC | PRN
  Administered 2016-04-09: 60 mg via INTRAVENOUS

## 2016-04-09 MED ORDER — ONDANSETRON HCL 4 MG/2ML IJ SOLN
INTRAMUSCULAR | Status: DC | PRN
  Administered 2016-04-09: 4 mg via INTRAVENOUS

## 2016-04-09 MED ORDER — ONDANSETRON HCL 4 MG/2ML IJ SOLN
4.0000 mg | Freq: Once | INTRAMUSCULAR | Status: AC
  Administered 2016-04-09: 4 mg via INTRAVENOUS

## 2016-04-09 MED ORDER — MORPHINE SULFATE (PF) 2 MG/ML IV SOLN: 1.0000 mg | INTRAVENOUS | Status: DC | PRN

## 2016-04-09 MED ORDER — MEPERIDINE HCL 25 MG/ML IJ SOLN: 6.2500 mg | INTRAMUSCULAR | Status: DC | PRN

## 2016-04-09 MED ORDER — DILTIAZEM HCL ER 120 MG PO CP24
120.0000 mg | ORAL_CAPSULE | Freq: Every day | ORAL | Status: DC
  Administered 2016-04-10: 120 mg via ORAL
  Filled 2016-04-09 (×2): qty 1

## 2016-04-09 MED ORDER — PROPOFOL 10 MG/ML IV BOLUS
INTRAVENOUS | Status: DC | PRN
  Administered 2016-04-09: 100 mg via INTRAVENOUS

## 2016-04-09 MED ORDER — CEFOXITIN SODIUM-DEXTROSE 2-2.2 GM-% IV SOLR (PREMIX)
2.0000 g | Freq: Once | INTRAVENOUS | Status: AC
  Administered 2016-04-09: 2000 mg via INTRAVENOUS

## 2016-04-09 MED ORDER — LACTATED RINGERS IV SOLN
INTRAVENOUS | Status: DC
  Administered 2016-04-09 (×2): via INTRAVENOUS

## 2016-04-09 MED ORDER — HYDROMORPHONE HCL 2 MG PO TABS
ORAL_TABLET | ORAL | Status: AC
  Administered 2016-04-09: 2 mg
  Filled 2016-04-09: qty 1

## 2016-04-09 MED ORDER — CEFAZOLIN SODIUM-DEXTROSE 2-4 GM/100ML-% IV SOLN
INTRAVENOUS | Status: AC
  Filled 2016-04-09: qty 100

## 2016-04-09 MED ORDER — OXYCODONE-ACETAMINOPHEN 5-325 MG PO TABS: 1.0000 | ORAL_TABLET | ORAL | 0 refills | Status: DC | PRN

## 2016-04-09 MED ORDER — NEOSTIGMINE METHYLSULFATE 10 MG/10ML IV SOLN
INTRAVENOUS | Status: DC | PRN
  Administered 2016-04-09: 3 mg via INTRAVENOUS

## 2016-04-09 MED ORDER — PROMETHAZINE HCL 25 MG/ML IJ SOLN: 6.2500 mg | INTRAMUSCULAR | Status: DC | PRN

## 2016-04-09 MED ORDER — ONDANSETRON 4 MG PO TBDP
4.0000 mg | ORAL_TABLET | Freq: Once | ORAL | Status: AC
  Administered 2016-04-09: 4 mg via ORAL
  Filled 2016-04-09: qty 1

## 2016-04-09 SURGICAL SUPPLY — 34 items
BAG URO DRAIN 2000ML W/SPOUT (MISCELLANEOUS) ×3 IMPLANT
BLADE SURG SZ10 CARB STEEL (BLADE) ×3 IMPLANT
CANISTER SUCT 1200ML W/VALVE (MISCELLANEOUS) ×3 IMPLANT
CATH FOLEY 2WAY  5CC 16FR (CATHETERS) ×1
CATH URTH 16FR FL 2W BLN LF (CATHETERS) ×2 IMPLANT
DRAPE PERI LITHO V/GYN (MISCELLANEOUS) ×3 IMPLANT
DRAPE SHEET LG 3/4 BI-LAMINATE (DRAPES) ×3 IMPLANT
DRAPE UNDER BUTTOCK W/FLU (DRAPES) ×3 IMPLANT
ELECT CAUTERY BLADE 6.4 (BLADE) IMPLANT
ELECT REM PT RETURN 9FT ADLT (ELECTROSURGICAL) ×3
ELECTRODE REM PT RTRN 9FT ADLT (ELECTROSURGICAL) ×2 IMPLANT
GAUZE PACK 2X3YD (MISCELLANEOUS) ×3 IMPLANT
GLOVE BIO SURGEON STRL SZ8 (GLOVE) ×3 IMPLANT
GOWN STRL REUS W/ TWL LRG LVL3 (GOWN DISPOSABLE) ×2 IMPLANT
GOWN STRL REUS W/ TWL XL LVL3 (GOWN DISPOSABLE) ×2 IMPLANT
GOWN STRL REUS W/TWL LRG LVL3 (GOWN DISPOSABLE) ×1
GOWN STRL REUS W/TWL XL LVL3 (GOWN DISPOSABLE) ×1
LABEL OR SOLS (LABEL) ×3 IMPLANT
NDL MAYO CATGUT SZ4 (NEEDLE) ×3 IMPLANT
NS IRRIG 500ML POUR BTL (IV SOLUTION) ×3 IMPLANT
PACK BASIN MINOR ARMC (MISCELLANEOUS) ×3 IMPLANT
PAD OB MATERNITY 4.3X12.25 (PERSONAL CARE ITEMS) ×3 IMPLANT
PAD PREP 24X41 OB/GYN DISP (PERSONAL CARE ITEMS) ×3 IMPLANT
SPONGE LAP 4X18 5PK (MISCELLANEOUS) ×3 IMPLANT
SPONGE XRAY 4X4 16PLY STRL (MISCELLANEOUS) ×3 IMPLANT
STRAP SAFETY BODY (MISCELLANEOUS) ×3 IMPLANT
SUT ETHIBOND NAB CT1 #1 30IN (SUTURE) ×3 IMPLANT
SUT VIC AB 0 CT1 27 (SUTURE) ×1
SUT VIC AB 0 CT1 27XCR 8 STRN (SUTURE) ×2 IMPLANT
SUT VIC AB 0 CT1 36 (SUTURE) ×6 IMPLANT
SUT VIC AB 1 CT1 36 (SUTURE) IMPLANT
SUT VIC AB 2-0 CT1 (SUTURE) ×6 IMPLANT
SYR BULB IRRIG 60ML STRL (SYRINGE) ×3 IMPLANT
SYRINGE 10CC LL (SYRINGE) ×3 IMPLANT

## 2016-04-09 NOTE — Anesthesia Postprocedure Evaluation (Signed)
Anesthesia Post Note  Patient: CODIE WERNECKE  Procedure(s) Performed: Procedure(s) (LRB): HYSTERECTOMY VAGINAL (N/A) ANTERIOR REPAIR (CYSTOCELE) (N/A) UNILATERAL SALPINGECTOMY (Left)  Patient location during evaluation: PACU Anesthesia Type: General Level of consciousness: awake and alert and oriented Pain management: pain level controlled Vital Signs Assessment: post-procedure vital signs reviewed and stable Respiratory status: spontaneous breathing, nonlabored ventilation and respiratory function stable Cardiovascular status: blood pressure returned to baseline and stable Postop Assessment: no signs of nausea or vomiting Anesthetic complications: no    Last Vitals:  Vitals:   04/09/16 0949 04/09/16 1005  BP: 119/76 119/81  Pulse: 75 74  Resp: 17 16  Temp:  (!) 35.8 C    Last Pain:  Vitals:   04/09/16 1005  TempSrc: Tympanic  PainSc: 4                  Tyrik Stetzer

## 2016-04-09 NOTE — Discharge Instructions (Signed)
General Gynecological Post-Operative Instructions ?You may expect to feel dizzy, weak, and drowsy for as long as 24 hours after receiving the medicine that made you sleep (anesthetic).  ?Do not drive a car, ride a bicycle, participate in physical activities, or take public transportation until you are done taking narcotic pain medicines or as directed by your doctor.  ?Do not drink alcohol or take tranquilizers.  ?Do not take medicine that has not been prescribed by your doctor.  ?Do not sign important papers or make important decisions while on narcotic pain medicines.  ?Have a responsible person with you.  ?CARE OF INCISION  ?Keep incision clean and dry. ?Take showers instead of baths until your doctor gives you permission to take baths.  ?Avoid heavy lifting (more than 10 pounds/4.5 kilograms), pushing, or pulling.  ?Avoid activities that may risk injury to your surgical site.  ?No sexual intercourse or placement of anything in the vagina for 6 weeks or as instructed by your doctor. ?If you have tubes coming from the wound site, check with your doctor regarding appropriate care of the tubes. ?Only take prescription or over-the-counter medicines  for pain, discomfort, or fever as directed by your doctor. Do not take aspirin. It can make you bleed. Take medicines (antibiotics) that kill germs if they are prescribed for you.  ?Call the office or go to the ER if:  ?You feel sick to your stomach (nauseous) and you start to throw up (vomit).  ?You have trouble eating or drinking.  ?You have an oral temperature above 101.  ?You have constipation that is not helped by adjusting diet or increasing fluid intake. Pain medicines are a common cause of constipation.  ?You have any other concerns. ?SEEK IMMEDIATE MEDICAL CARE IF:  ?You have persistent dizziness.  ?You have difficulty breathing or a congested sounding (croupy) cough.  ?You have an oral temperature above 102.5, not controlled by medicine.  ?There is increasing  pain or tenderness near or in the surgical site.  ? ? ?

## 2016-04-09 NOTE — ED Notes (Signed)
Pt took 2mg  dilaudid PO home med with permission from Dr. Kerman Passey.

## 2016-04-09 NOTE — ED Triage Notes (Addendum)
Pt to triage via wheelchair.  Pt had vaginal hysterectomy today by dr Kenton Kingfisher.  Pt has abd pain with swelling.  Pt has not voided since going home at noon today.  Pt also reports pain in right shoulder and upper back.  Pt alert.

## 2016-04-09 NOTE — ED Notes (Signed)
Admitting MD at bedside.

## 2016-04-09 NOTE — Transfer of Care (Signed)
Immediate Anesthesia Transfer of Care Note  Patient: Vanessa Evans  Procedure(s) Performed: Procedure(s): HYSTERECTOMY VAGINAL (N/A) ANTERIOR REPAIR (CYSTOCELE) (N/A) UNILATERAL SALPINGECTOMY (Left)  Patient Location: PACU  Anesthesia Type:General  Level of Consciousness: awake, alert , oriented and patient cooperative  Airway & Oxygen Therapy: Patient Spontanous Breathing and Patient connected to face mask oxygen  Post-op Assessment: Report given to RN, Post -op Vital signs reviewed and stable and Patient moving all extremities X 4  Post vital signs: Reviewed and stable  Last Vitals:  Vitals:   04/09/16 0624  BP: (!) 142/98  Pulse: (!) 101  Resp: 20  Temp: 36.2 C    Last Pain:  Vitals:   04/09/16 0624  TempSrc: Oral         Complications: No apparent anesthesia complications

## 2016-04-09 NOTE — ED Notes (Signed)
Pt up from toilet and back to room.  Unsuccessful with urination.

## 2016-04-09 NOTE — ED Provider Notes (Signed)
[facility] Emergency Department Provider Note  Time seen: 6:19 PM  I have reviewed the triage vital signs and the nursing notes.   HISTORY  Chief Complaint Abdominal Pain and Post-op Problem    HPI Vanessa Evans is a 70 y.o. female with a past medical history of anxiety, arthritis, depression, gastric reflux, hypertension, hyperlipidemia, who underwent a vaginal hysterectomy this morning by Dr. Kenton Kingfisher. Patient states since going home she has had increased lower abdominal discomfort and has not been able to urinate. Patient was not sure if this is a normal amount of postsurgical pain, but she became concerned so she came to the emergency department for evaluation. Describes the pain as moderate, worse with movement. States she has not been able to urinate since the Foley catheter was removed around noon today.  Past Medical History:  Diagnosis Date  . Anxiety   . Arthritis    osteo  . Asthma    needs rescue inhaler few times a year  . Depression   . Dizziness   . Dysrhythmia    tachycardia  . GERD (gastroesophageal reflux disease)   . Headache(784.0)   . Heart murmur    aortic  . Hyperlipidemia   . Hypertension   . Migraine   . Orthopnea   . Pneumonia    hx  . PONV (postoperative nausea and vomiting)    very anxious during cataract surgery  . Shortness of breath    any exertion, cannot lie flat    Patient Active Problem List   Diagnosis Date Noted  . Uterine prolapse 04/09/2016  . Cystocele 04/09/2016    Past Surgical History:  Procedure Laterality Date  . BACK SURGERY    . BREAST SURGERY Right    lumpectomy  . CATARACT EXTRACTION W/PHACO Left 12/25/2014   Procedure: CATARACT EXTRACTION PHACO AND INTRAOCULAR LENS PLACEMENT (IOC);  Surgeon: Birder Robson, MD;  Location: [facility] ORS;  Service: Ophthalmology;  Laterality: Left;  [place] 00:32 AP% 20.0 CDE 6.49  . COLONOSCOPY W/ BIOPSIES    . CYSTOCELE REPAIR N/A 04/09/2016   Procedure:  ANTERIOR REPAIR (CYSTOCELE);  Surgeon: Gae Dry, MD;  Location: [facility] ORS;  Service: Gynecology;  Laterality: N/A;  . DIAGNOSTIC LAPAROSCOPY    . DILATION AND CURETTAGE OF UTERUS    . EYE SURGERY Bilateral   . HERNIA REPAIR Right 2008   Inguinal hernia Repair  . JOINT REPLACEMENT Right    knee  . KNEE ARTHROSCOPY Right   . LUMBAR LAMINECTOMY/DECOMPRESSION MICRODISCECTOMY Right 03/21/2013   Procedure: Right Lumbar five-Sacral one Laminectomy for Synovial Cyst;  Surgeon: Faythe Ghee, MD;  Location: [facility];  Service: Neurosurgery;  Laterality: Right;  right  . OOPHORECTOMY    . UNILATERAL SALPINGECTOMY Left 04/09/2016   Procedure: UNILATERAL SALPINGECTOMY;  Surgeon: Gae Dry, MD;  Location: [facility] ORS;  Service: Gynecology;  Laterality: Left;  Marland Kitchen VAGINAL HYSTERECTOMY N/A 04/09/2016   Procedure: HYSTERECTOMY VAGINAL;  Surgeon: Gae Dry, MD;  Location: [facility] ORS;  Service: Gynecology;  Laterality: N/A;    Prior to Admission medications   Medication Sig Start Date End Date Taking? Authorizing Provider  acetaminophen (TYLENOL) 500 MG tablet Take 1,000 mg by mouth every 6 (six) hours as needed.    Historical Provider, MD  albuterol (PROVENTIL HFA;VENTOLIN HFA) 108 (90 BASE) MCG/ACT inhaler Inhale 2 puffs into the lungs every 6 (six) hours as needed for wheezing.    Historical Provider, MD  aspirin EC 81 MG tablet Take  81 mg by mouth daily.    Historical Provider, MD  benzonatate (TESSALON) 100 MG capsule Take 100-200 mg by mouth 3 (three) times daily as needed for cough.    Historical Provider, MD  Calcium Carbonate-Vitamin D (CALCIUM 600 + D PO) Take 1 tablet by mouth 2 (two) times daily.     Historical Provider, MD  cholecalciferol (VITAMIN D) 1000 UNITS tablet Take 1,000 Units by mouth daily.    Historical Provider, MD  Choline Fenofibrate (TRILIPIX) 135 MG capsule Take 135 mg by mouth daily.    Historical Provider, MD  citalopram (CELEXA) 10 MG tablet Take 5 mg by mouth  as needed.     Historical Provider, MD  cyclobenzaprine (FLEXERIL) 10 MG tablet Take 5 mg by mouth 2 (two) times daily as needed for muscle spasms.     Historical Provider, MD  diclofenac sodium (VOLTAREN) 1 % GEL Apply topically 3 (three) times daily as needed.    Historical Provider, MD  diltiazem (DILACOR XR) 180 MG 24 hr capsule Take 120 mg by mouth at bedtime.     Historical Provider, MD  DiphenhydrAMINE HCl (BENADRYL ALLERGY PO) Take 2 tablets by mouth at bedtime as needed. For allergies    Historical Provider, MD  docusate sodium (COLACE) 100 MG capsule Take 100-300 mg by mouth at bedtime as needed for constipation.    Historical Provider, MD  EPINEPHrine (EPIPEN IJ) Inject as directed.    Historical Provider, MD  estradiol (ESTRACE VAGINAL) 0.1 MG/GM vaginal cream Place 1 Applicatorful vaginally daily.    Historical Provider, MD  fenofibrate 160 MG tablet Take 160 mg by mouth at bedtime.     Historical Provider, MD  fluticasone (FLONASE) 50 MCG/ACT nasal spray Place 2 sprays into the nose daily.    Historical Provider, MD  folic acid (FOLVITE) A999333 MCG tablet Take 400 mcg by mouth daily.    Historical Provider, MD  HYDROmorphone (DILAUDID) 2 MG tablet Take 1 tablet (2 mg total) by mouth every 4 (four) hours as needed for moderate pain or severe pain. 04/09/16   Gae Dry, MD  ibandronate (BONIVA) 150 MG tablet Take 150 mg by mouth every 30 (thirty) days. Take in the morning with a full glass of water, on an empty stomach, and do not take anything else by mouth or lie down for the next 30 min.    Historical Provider, MD  levocetirizine (XYZAL) 5 MG tablet Take 5 mg by mouth daily as needed.     Historical Provider, MD  levothyroxine (SYNTHROID, LEVOTHROID) 50 MCG tablet Take 75 mcg by mouth daily before breakfast.     Historical Provider, MD  Liniments (BEN GAY EX) Apply 1 application topically 3 (three) times daily.    Historical Provider, MD  Magnesium 250 MG TABS Take 500 mg by mouth  daily.     Historical Provider, MD  Melatonin 5 MG TABS Take 1 tablet by mouth at bedtime as needed. For sleep    Historical Provider, MD  Multiple Vitamins-Minerals (MULTIVITAMIN WITH MINERALS) tablet Take 1 tablet by mouth daily.    Historical Provider, MD  oxyCODONE-acetaminophen (PERCOCET) 5-325 MG tablet Take 1 tablet by mouth every 4 (four) hours as needed for moderate pain or severe pain. 04/09/16   Gae Dry, MD  pantoprazole (PROTONIX) 40 MG tablet Take 40 mg by mouth as needed.     Historical Provider, MD  UNABLE TO FIND Allergy injections    Historical Provider, MD  vitamin C (ASCORBIC  ACID) 500 MG tablet Take 500 mg by mouth daily.    Historical Provider, MD  zinc gluconate 50 MG tablet Take 50 mg by mouth daily.    Historical Provider, MD    Allergies  Allergen Reactions  . Codeine Shortness Of Breath and Itching    Bronchospasms and asthma attach  . Ultram [Tramadol] Itching and Other (See Comments)    Bronchospasm and asthma attack  . Nitrofuran Derivatives Diarrhea    Severe and long lasting  . Levaquin [Levofloxacin In D5w] Other (See Comments)    Sensation of being drunk, confused, caused a ruptured tendon in foot  . Ultracet [Tramadol-Acetaminophen] Itching  . Ciprofloxacin Other (See Comments)    Severe pain in neck and down back   . Nsaids Other (See Comments)    Bloody stools, abdominal pain, can tolerate naproxen in low doses.  . Sulfa Antibiotics Itching, Rash and Other (See Comments)    fever    No family history on file.  Social History Social History  Substance Use Topics  . Smoking status: Former Research scientist (life sciences)  . Smokeless tobacco: Never Used  . Alcohol use Yes     Comment: occasional wine    Review of Systems Constitutional: Negative for fever. Cardiovascular: Negative for chest pain. Respiratory: Negative for shortness of breath. Gastrointestinal: Lower abdominal pain. Negative for vomiting or diarrhea. Positive for  constipation. Genitourinary: Negative for dysuria Neurological: Negative for headache 10-point ROS otherwise negative.  ____________________________________________   PHYSICAL EXAM:  VITAL SIGNS: ED Triage Vitals  Enc Vitals Group     BP 04/09/16 1701 (!) 145/83     Pulse Rate 04/09/16 1701 79     Resp 04/09/16 1701 20     Temp 04/09/16 1701 98.1 F (36.7 C)     Temp Source 04/09/16 1701 Oral     SpO2 04/09/16 1701 98 %     Weight 04/09/16 1703 126 lb (57.2 kg)     Height 04/09/16 1703 5\' 1"  (1.549 m)     Head Circumference --      Peak Flow --      Pain Score 04/09/16 1703 8     Pain Loc --      Pain Edu? --      Excl. in [place]? --     Constitutional: Alert and oriented. Well appearing and in no distress. Eyes: Normal exam ENT   Head: Normocephalic and atraumatic.   Mouth/Throat: Mucous membranes are moist. Cardiovascular: Normal rate, regular rhythm. No murmur Respiratory: Normal respiratory effort without tachypnea nor retractions. Breath sounds are clear  Gastrointestinal: Soft, mild suprapubic tenderness palpation, no rebound or guarding. No distention, abdomen otherwise benign. Musculoskeletal: Nontender with normal range of motion in all extremities Neurologic:  Normal speech and language. No gross focal neurologic deficits Skin:  Skin is warm, dry and intact.  Psychiatric: Mood and affect are normal.   ____________________________________________   INITIAL IMPRESSION / ASSESSMENT AND PLAN / ED COURSE  Pertinent labs & imaging results that were available during my care of the patient were reviewed by me and considered in my medical decision making (see chart for details).  The patient presents the emergency department with lower abdominal pain since having a vaginal hysterectomy this morning. Patient also states she has not been able to urinate since this morning as well. Patient's bladder scan resulted showing 350 cc. Patient's blood work shows a mild  leukocytosis of 13,000 with hyponatremia and a sodium of 127. The sodium appears to be quite  changed her sodium earlier today of 141. We will repeat the lab. We will discuss the patient with Dr. Kenton Kingfisher for consultation.  Repeat bladder scan shows greater than 1 L, patient still unable to urinate more than 100 cc. We have placed a Foley catheter. Patient's repeat chemistry continues show sodium of 127. Patient given IV fluids. I discussed patient with Dr. Kenton Kingfisher will be admitting to monitor the patient overnight.  ____________________________________________   FINAL CLINICAL IMPRESSION(S) / ED DIAGNOSES  Lower abdominal pain Postsurgical pain Hyponatremia Urinary retention    Harvest Dark, MD 04/09/16 2206

## 2016-04-09 NOTE — Anesthesia Procedure Notes (Signed)
Procedure Name: Intubation Date/Time: 04/09/2016 7:36 AM Performed by: Silvana Newness Pre-anesthesia Checklist: Patient identified, Emergency Drugs available, Suction available, Patient being monitored and Timeout performed Patient Re-evaluated:Patient Re-evaluated prior to inductionOxygen Delivery Method: Circle system utilized Preoxygenation: Pre-oxygenation with 100% oxygen Intubation Type: IV induction Ventilation: Mask ventilation without difficulty Laryngoscope Size: Mac and 3 Grade View: Grade I Tube type: Oral Tube size: 7.0 mm Number of attempts: 1 Airway Equipment and Method: Rigid stylet Placement Confirmation: ETT inserted through vocal cords under direct vision,  positive ETCO2 and breath sounds checked- equal and bilateral Secured at: 18 cm Tube secured with: Tape Dental Injury: Teeth and Oropharynx as per pre-operative assessment

## 2016-04-09 NOTE — ED Notes (Signed)
Bladder scan 314mls.

## 2016-04-09 NOTE — Progress Notes (Signed)
Difficult intubation removed from patient's history. Grade 1 view with MAC 3 blade with minimal BURP, intubated on one attempt.

## 2016-04-09 NOTE — H&P (Signed)
History and Physical Interval Note:  04/09/2016 7:01 AM  Vanessa Evans  has presented today for surgery, with the diagnosis of PELVIC ORGAN PROLAPSE, CYSTOCELE  The various methods of treatment have been discussed with the patient and family. After consideration of risks, benefits and other options for treatment, the patient has consented to  Procedure(s): HYSTERECTOMY VAGINAL (N/A) ANTERIOR REPAIR (CYSTOCELE) (N/A) as a surgical intervention .  The patient's history has been reviewed, patient examined, no change in status, stable for surgery.  Pt has the following beta blocker history-  Not taking Beta Blocker.  I have reviewed the patient's chart and labs.  Questions were answered to the patient's satisfaction.    Hoyt Koch

## 2016-04-09 NOTE — H&P (Signed)
Obstetrics & Gynecology Consultation Note  Date of Consultation: 04/09/2016   Requesting Provider: [facility] ER  Primary OBGYN: Kenton Kingfisher Primary Care Provider: Lavera Guise  Reason for Consultation: Post Op Urinary Retention and Hyponatremia  History of Present Illness: Ms. Zupko is a 70 y.o. with the above CC. TVH, AR this am, went home after foley removed and reports has not voided on own since foley removed.  This led to pain in abdomen.    In ER, bladder scan first showed 32mL, then more after bolus, still unable to void.  Catheter placed.  Also found to have low sodium on 2 occasions, and preop it was normal (also on 2 separate occasions including this am).  ROS: A 12-point review of systems was performed and negative, except as stated in the above HPI.  OBGYN History: As per HPI. OB History    No data available      Past Medical History: Past Medical History:  Diagnosis Date  . Anxiety   . Arthritis    osteo  . Asthma    needs rescue inhaler few times a year  . Depression   . Dizziness   . Dysrhythmia    tachycardia  . GERD (gastroesophageal reflux disease)   . Headache(784.0)   . Heart murmur    aortic  . Hyperlipidemia   . Hypertension   . Migraine   . Orthopnea   . Pneumonia    hx  . PONV (postoperative nausea and vomiting)    very anxious during cataract surgery  . Shortness of breath    any exertion, cannot lie flat    Past Surgical History: Past Surgical History:  Procedure Laterality Date  . BACK SURGERY    . BREAST SURGERY Right    lumpectomy  . CATARACT EXTRACTION W/PHACO Left 12/25/2014   Procedure: CATARACT EXTRACTION PHACO AND INTRAOCULAR LENS PLACEMENT (IOC);  Surgeon: Birder Robson, MD;  Location: [facility] ORS;  Service: Ophthalmology;  Laterality: Left;  [place] 00:32 AP% 20.0 CDE 6.49  . COLONOSCOPY W/ BIOPSIES    . CYSTOCELE REPAIR N/A 04/09/2016   Procedure: ANTERIOR REPAIR (CYSTOCELE);  Surgeon: Gae Dry, MD;  Location: [facility] ORS;   Service: Gynecology;  Laterality: N/A;  . DIAGNOSTIC LAPAROSCOPY    . DILATION AND CURETTAGE OF UTERUS    . EYE SURGERY Bilateral   . HERNIA REPAIR Right 2008   Inguinal hernia Repair  . JOINT REPLACEMENT Right    knee  . KNEE ARTHROSCOPY Right   . LUMBAR LAMINECTOMY/DECOMPRESSION MICRODISCECTOMY Right 03/21/2013   Procedure: Right Lumbar five-Sacral one Laminectomy for Synovial Cyst;  Surgeon: Faythe Ghee, MD;  Location: [facility];  Service: Neurosurgery;  Laterality: Right;  right  . OOPHORECTOMY    . UNILATERAL SALPINGECTOMY Left 04/09/2016   Procedure: UNILATERAL SALPINGECTOMY;  Surgeon: Gae Dry, MD;  Location: [facility] ORS;  Service: Gynecology;  Laterality: Left;  Marland Kitchen VAGINAL HYSTERECTOMY N/A 04/09/2016   Procedure: HYSTERECTOMY VAGINAL;  Surgeon: Gae Dry, MD;  Location: [facility] ORS;  Service: Gynecology;  Laterality: N/A;    Family History:  No family history on file. She denies any female cancers, bleeding or blood clotting disorders.   Social History:  Social History   Social History  . Marital status: Married    Spouse name: N/A  . Number of children: N/A  . Years of education: N/A   Occupational History  . Not on file.   Social History Main Topics  . Smoking status: Former Research scientist (life sciences)  .  Smokeless tobacco: Never Used  . Alcohol use Yes     Comment: occasional wine  . Drug use: No  . Sexual activity: Not on file   Other Topics Concern  . Not on file   Social History Narrative  . No narrative on file    Health Maintenance:  Allergy: Allergies  Allergen Reactions  . Codeine Shortness Of Breath and Itching    Bronchospasms and asthma attach  . Ultram [Tramadol] Itching and Other (See Comments)    Bronchospasm and asthma attack  . Nitrofuran Derivatives Diarrhea    Severe and long lasting  . Levaquin [Levofloxacin In D5w] Other (See Comments)    Sensation of being drunk, confused, caused a ruptured tendon in foot  . Ultracet  [Tramadol-Acetaminophen] Itching  . Ciprofloxacin Other (See Comments)    Severe pain in neck and down back   . Nsaids Other (See Comments)    Bloody stools, abdominal pain, can tolerate naproxen in low doses.  . Sulfa Antibiotics Itching, Rash and Other (See Comments)    fever    Current Outpatient Medications:  (Not in a hospital admission)   Hospital Medications: Current Facility-Administered Medications  Medication Dose Route Frequency Provider Last Rate Last Dose  . diltiazem (DILACOR XR) 24 hr capsule 120 mg  120 mg Oral QHS Gae Dry, MD      . docusate sodium (COLACE) capsule 100-300 mg  100-300 mg Oral QHS PRN Gae Dry, MD      . HYDROmorphone (DILAUDID) tablet 2 mg  2 mg Oral Q4H PRN Gae Dry, MD      . Derrill Memo ON 04/10/2016] levothyroxine (SYNTHROID, LEVOTHROID) tablet 75 mcg  75 mcg Oral QAC breakfast Gae Dry, MD       Current Outpatient Prescriptions  Medication Sig Dispense Refill  . acetaminophen (TYLENOL) 500 MG tablet Take 1,000 mg by mouth every 6 (six) hours as needed.    Marland Kitchen albuterol (PROVENTIL HFA;VENTOLIN HFA) 108 (90 BASE) MCG/ACT inhaler Inhale 2 puffs into the lungs every 6 (six) hours as needed for wheezing.    Marland Kitchen aspirin EC 81 MG tablet Take 81 mg by mouth daily.    . benzonatate (TESSALON) 100 MG capsule Take 100-200 mg by mouth 3 (three) times daily as needed for cough.    . Calcium Carbonate-Vitamin D (CALCIUM 600 + D PO) Take 1 tablet by mouth 2 (two) times daily.     . cholecalciferol (VITAMIN D) 1000 UNITS tablet Take 1,000 Units by mouth daily.    . Choline Fenofibrate (TRILIPIX) 135 MG capsule Take 135 mg by mouth daily.    . citalopram (CELEXA) 10 MG tablet Take 5 mg by mouth as needed.     . cyclobenzaprine (FLEXERIL) 10 MG tablet Take 5 mg by mouth 2 (two) times daily as needed for muscle spasms.     . diclofenac sodium (VOLTAREN) 1 % GEL Apply topically 3 (three) times daily as needed.    . diltiazem (DILACOR XR) 180 MG  24 hr capsule Take 120 mg by mouth at bedtime.     . DiphenhydrAMINE HCl (BENADRYL ALLERGY PO) Take 2 tablets by mouth at bedtime as needed. For allergies    . docusate sodium (COLACE) 100 MG capsule Take 100-300 mg by mouth at bedtime as needed for constipation.    Marland Kitchen EPINEPHrine (EPIPEN IJ) Inject as directed.    Marland Kitchen estradiol (ESTRACE VAGINAL) 0.1 MG/GM vaginal cream Place 1 Applicatorful vaginally daily.    . fenofibrate  160 MG tablet Take 160 mg by mouth at bedtime.     . fluticasone (FLONASE) 50 MCG/ACT nasal spray Place 2 sprays into the nose daily.    . folic acid (FOLVITE) A999333 MCG tablet Take 400 mcg by mouth daily.    Marland Kitchen HYDROmorphone (DILAUDID) 2 MG tablet Take 1 tablet (2 mg total) by mouth every 4 (four) hours as needed for moderate pain or severe pain. 40 tablet 0  . ibandronate (BONIVA) 150 MG tablet Take 150 mg by mouth every 30 (thirty) days. Take in the morning with a full glass of water, on an empty stomach, and do not take anything else by mouth or lie down for the next 30 min.    Marland Kitchen levocetirizine (XYZAL) 5 MG tablet Take 5 mg by mouth daily as needed.     Marland Kitchen levothyroxine (SYNTHROID, LEVOTHROID) 50 MCG tablet Take 75 mcg by mouth daily before breakfast.     . Liniments (BEN GAY EX) Apply 1 application topically 3 (three) times daily.    . Magnesium 250 MG TABS Take 500 mg by mouth daily.     . Melatonin 5 MG TABS Take 1 tablet by mouth at bedtime as needed. For sleep    . Multiple Vitamins-Minerals (MULTIVITAMIN WITH MINERALS) tablet Take 1 tablet by mouth daily.    Marland Kitchen oxyCODONE-acetaminophen (PERCOCET) 5-325 MG tablet Take 1 tablet by mouth every 4 (four) hours as needed for moderate pain or severe pain. 30 tablet 0  . pantoprazole (PROTONIX) 40 MG tablet Take 40 mg by mouth as needed.     Marland Kitchen UNABLE TO FIND Allergy injections    . vitamin C (ASCORBIC ACID) 500 MG tablet Take 500 mg by mouth daily.    Marland Kitchen zinc gluconate 50 MG tablet Take 50 mg by mouth daily.       Physical  Exam: Vitals:   04/09/16 1701 04/09/16 1703 04/09/16 2234  BP: (!) 145/83  131/72  Pulse: 79  65  Resp: 20  18  Temp: 98.1 F (36.7 C)    TempSrc: Oral    SpO2: 98%  98%  Weight:  126 lb (57.2 kg)   Height:  5\' 1"  (1.549 m)     Temp:  [96.5 F (35.8 C)-98.1 F (36.7 C)] 98.1 F (36.7 C) (08/31 1701) Pulse Rate:  [64-101] 65 (08/31 2234) Resp:  [10-20] 18 (08/31 2234) BP: (107-145)/(54-98) 131/72 (08/31 2234) SpO2:  [94 %-100 %] 98 % (08/31 2234) Weight:  [126 lb (57.2 kg)] 126 lb (57.2 kg) (08/31 1703) No intake/output data recorded. No intake/output data recorded. No intake or output data in the 24 hours ending 04/09/16 2255   Current Vital Signs 24h Vital Sign Ranges  T 98.1 F (36.7 C) Temp  Avg: 97.3 F (36.3 C)  Min: 96.5 F (35.8 C)  Max: 98.1 F (36.7 C)  BP 131/72 BP  Min: 107/55  Max: 145/83  HR 65 Pulse  Avg: 73.5  Min: 64  Max: 101  RR 18 Resp  Avg: 15.7  Min: 10  Max: 20  SaO2 98 % Not Delivered SpO2  Avg: 98.5 %  Min: 94 %  Max: 100 %       24 Hour I/O Current Shift I/O  Time Ins Outs No intake/output data recorded. No intake/output data recorded.   Patient Vitals for the past 8 hrs:  BP Temp Temp src Pulse Resp SpO2 Height Weight  04/09/16 2234 131/72 - - 65 18 98 % - -  04/09/16 1703 - - - - - - 5\' 1"  (1.549 m) 126 lb (57.2 kg)  04/09/16 1701 (!) 145/83 98.1 F (36.7 C) Oral 79 20 98 % - -    Body mass index is 23.81 kg/m. General appearance: Well nourished, well developed female in no acute distress.  Neck:  Supple, normal appearance, and no thyromegaly  Cardiovascular:Regular rate and rhythm.  No murmurs, rubs or gallops. Respiratory:  Clear to auscultation bilateral. Normal respiratory effort Abdomen: positive bowel sounds and no masses, hernias; diffusely non tender to palpation, non distended Neuro/Psych:  Normal mood and affect.  Skin:  Warm and dry.  Lymphatic:  No inguinal lymphadenopathy.   Recent Labs Lab 04/03/16 1304  04/09/16 0656 04/09/16 1712  WBC 6.9  --  13.5*  HGB 14.2 14.6 12.3  HCT 42.1 43.0 36.8  PLT 271  --  250    Recent Labs Lab 04/03/16 1304 04/09/16 0656 04/09/16 1712 04/09/16 1901  NA 141 141 127* 127*  K 3.0* 3.6 4.0 3.9  CL 105  --  95* 95*  CO2 29  --  22 24  BUN 16  --  17 16  CREATININE 0.54  --  0.62 0.61  CALCIUM 9.4  --  8.7* 8.8*  PROT  --   --  6.5  --   BILITOT  --   --  0.4  --   ALKPHOS  --   --  24*  --   ALT  --   --  26  --   AST  --   --  32  --   GLUCOSE 99 84 179* 133*    Recent Labs Lab 04/03/16 1304  APTT 27  INR 1.01    Recent Labs Lab 04/03/16 1304  ABORH A POS   Assessment: Ms. Mongillo is a 70 y.o.who presented to the ED with complaints of pain and urinary retention; findings are consistent with hyponatremia as well.  Plan: Foley, leave in overnight, voiding trial in am Saline for IVF.  Recheck in am.  Hospitalist consult as this is medical issue I cannot explain by surgery or urinary retention alone. Analgesia.  Barnett Applebaum, MD [facility] Pager [phone]

## 2016-04-09 NOTE — Op Note (Signed)
Preoperative diagnosis: Uterine prolapse and Cystocele  Postoperative diagnosis: Same  Procedure: Transvaginal hysterectomy  Left Salpingectomy Anterior Colporrhaphy  Surgeon: Barnett Applebaum, M.D.  Anesthesia: general  Findings: Small prolapsed uterus, left tube present, right tube and both ovaries absent, cystocele.  Estimated blood loss: 10 cc  Specimen: Uterus to pathology   Disposition: Tolerated procedure well  Procedure: Patient was taken to the OR where she was placed in dorsal lithotomy in [place]. She was prepped and draped in the usual sterile fashion. A timeout was performed. Foley is placed into bladder. A speculum was placed inside the vagina. The cervix was visualized and grasped with a tenaculum. 1% lidocaine with epinephrine were injected paracervically. A bovie was used to make a circumferential incision around the vagina. An opened sponge was used to dissect the vagina off the cervix. The posterior peritoneum was entered sharply with Mayo scissors.  The anterior peritoneal cavity was entered sharply with careful dissection of the bladder off the underlying cervix. A Heaney clamp was used to clamp first the left uterosacral ligament and cardinal which was then cut and Haney suture ligated with 0 Vicryl stitch, the stitch was held and later attached to the vaginal mucosa. Similarly the right uterosacral ligament was clamped cut and suture ligated.  Sequential clamping, transecting and suture ligating up the broad to the uterine arteries were taken until the tubo-ovarian pedicles were encountered. The uterus was then amputated. The left utero-ovarian pedicle grasped with a Heaney clamp. The right utero-ovarian pedicle was similarly grasped with the Heaney clamp. The left tube was then grasped with Babcock clamp and a Heaney clamp placed behind this. Tube was removed and the IP ligated with a free tie followed by suture ligature. Inspection of all pedicles revealed adequate  hemostasis.  The peritoneum was then closed with a running pursestring suture of 0 Vicryl. The uterosacrals were plicated using a 1-0 Ethibond suture.  Vagina is irrigated. Anterior colporrhaphy is performed.  Allis clamps are placed along the anterior vaginal wall, lidocaine is used to infiltrate the plane, and incision is made midline vertical.  Endopelvic fascia is dissected free of vaginal mucosa.  Fascia is plicated w interrupted vicryl sutures.  Excess mucosa is excised.  Vaginal incision is closed with a running locking Vicryl suture, to incoprate the hysterectomy incision as well, with care taken to incorporate the uterosacral pedicles. Excellent hemostasis was noted at the end of the case. The vaginal cuff was inspected there was minimal bleeding noted.  Packing gauze w AVC cream is placed. A Foley catheter is left in  place inside her bladder. Clear, yellow urine was noted. All instrument needle and lap counts were correct x 2. Patient was awakened taken to recovery room in stable condition.  Hoyt Koch, MD 04/09/2016, 9:04 AM

## 2016-04-09 NOTE — Anesthesia Preprocedure Evaluation (Signed)
Anesthesia Evaluation  Patient identified by MRN, date of birth, ID band Patient awake    Reviewed: Allergy & Precautions, NPO status , Patient's Chart, lab work & pertinent test results  History of Anesthesia Complications (+) PONVHistory of anesthetic complications: previous anesthetic record reviewed, grade 3 view with miller 2, intubated with 2 attempts.  Airway Mallampati: II  TM Distance: >3 FB Neck ROM: Full    Dental  (+) Poor Dentition   Pulmonary asthma , neg sleep apnea, former smoker,    breath sounds clear to auscultation- rhonchi (-) wheezing      Cardiovascular hypertension, Pt. on medications (-) CAD and (-) Past MI + dysrhythmias  Rhythm:Regular Rate:Normal - Systolic murmurs and - Diastolic murmurs    Neuro/Psych  Headaches, Anxiety Depression    GI/Hepatic Neg liver ROS, GERD  ,  Endo/Other  negative endocrine ROSneg diabetes  Renal/GU negative Renal ROS     Musculoskeletal  (+) Arthritis ,   Abdominal (+) - obese,   Peds  Hematology negative hematology ROS (+)   Anesthesia Other Findings Past Medical History: No date: Anxiety No date: Arthritis     Comment: osteo No date: Asthma     Comment: needs rescue inhaler few times a year No date: Depression No date: Difficult intubation     Comment: "told by dentist patient has restricted               airway" No date: Dizziness No date: Dysrhythmia     Comment: tachycardia No date: GERD (gastroesophageal reflux disease) No date: Headache(784.0) No date: Heart murmur     Comment: aortic No date: Hyperlipidemia No date: Hypertension No date: Migraine No date: Orthopnea No date: Pneumonia     Comment: hx No date: PONV (postoperative nausea and vomiting)     Comment: very anxious during cataract surgery No date: Shortness of breath     Comment: any exertion, cannot lie flat   Reproductive/Obstetrics                              Anesthesia Physical Anesthesia Plan  ASA: III  Anesthesia Plan: General   Post-op Pain Management:    Induction: Intravenous  Airway Management Planned: Oral ETT  Additional Equipment:   Intra-op Plan:   Post-operative Plan: Extubation in OR  Informed Consent: I have reviewed the patients History and Physical, chart, labs and discussed the procedure including the risks, benefits and alternatives for the proposed anesthesia with the patient or authorized representative who has indicated his/her understanding and acceptance.   Dental advisory given  Plan Discussed with: CRNA and Anesthesiologist  Anesthesia Plan Comments:         Anesthesia Quick Evaluation

## 2016-04-09 NOTE — ED Notes (Signed)
Pt taken to bathroom attempting to urinate.

## 2016-04-10 DIAGNOSIS — N814 Uterovaginal prolapse, unspecified: Secondary | ICD-10-CM | POA: Diagnosis not present

## 2016-04-10 DIAGNOSIS — E871 Hypo-osmolality and hyponatremia: Secondary | ICD-10-CM | POA: Diagnosis not present

## 2016-04-10 DIAGNOSIS — R338 Other retention of urine: Secondary | ICD-10-CM | POA: Diagnosis not present

## 2016-04-10 DIAGNOSIS — N72 Inflammatory disease of cervix uteri: Secondary | ICD-10-CM | POA: Diagnosis not present

## 2016-04-10 DIAGNOSIS — D259 Leiomyoma of uterus, unspecified: Secondary | ICD-10-CM | POA: Diagnosis not present

## 2016-04-10 DIAGNOSIS — N838 Other noninflammatory disorders of ovary, fallopian tube and broad ligament: Secondary | ICD-10-CM | POA: Diagnosis not present

## 2016-04-10 LAB — SURGICAL PATHOLOGY

## 2016-04-10 LAB — BASIC METABOLIC PANEL
ANION GAP: 7 (ref 5–15)
BUN: 9 mg/dL (ref 6–20)
CALCIUM: 8.9 mg/dL (ref 8.9–10.3)
CHLORIDE: 106 mmol/L (ref 101–111)
CO2: 24 mmol/L (ref 22–32)
Creatinine, Ser: 0.45 mg/dL (ref 0.44–1.00)
GFR calc non Af Amer: >60 mL/min (ref >60)
GLUCOSE: 108 mg/dL — AB (ref 65–99)
POTASSIUM: 3.5 mmol/L (ref 3.5–5.1)
Sodium: 137 mmol/L (ref 135–145)

## 2016-04-10 MED ORDER — SODIUM CHLORIDE 0.45 % IV SOLN
INTRAVENOUS | Status: DC
  Administered 2016-04-10: via INTRAVENOUS

## 2016-04-10 MED ORDER — SIMETHICONE 80 MG PO CHEW
80.0000 mg | CHEWABLE_TABLET | Freq: Four times a day (QID) | ORAL | Status: DC | PRN
  Administered 2016-04-10 (×3): 80 mg via ORAL
  Filled 2016-04-10 (×3): qty 1

## 2016-04-10 MED ORDER — BISACODYL 10 MG RE SUPP: 10.0000 mg | Freq: Every day | RECTAL | Status: DC | PRN

## 2016-04-10 MED ORDER — ONDANSETRON HCL 4 MG/2ML IJ SOLN
4.0000 mg | Freq: Four times a day (QID) | INTRAMUSCULAR | Status: DC | PRN
  Administered 2016-04-10: 4 mg via INTRAVENOUS
  Filled 2016-04-10: qty 2

## 2016-04-10 MED ORDER — SENNOSIDES-DOCUSATE SODIUM 8.6-50 MG PO TABS: 1.0000 | ORAL_TABLET | Freq: Every evening | ORAL | Status: DC | PRN

## 2016-04-10 MED ORDER — ACETAMINOPHEN 325 MG PO TABS
650.0000 mg | ORAL_TABLET | ORAL | Status: DC | PRN
  Administered 2016-04-10 (×3): 650 mg via ORAL
  Filled 2016-04-10 (×3): qty 2

## 2016-04-10 MED ORDER — ONDANSETRON HCL 4 MG PO TABS: 4.0000 mg | ORAL_TABLET | Freq: Four times a day (QID) | ORAL | Status: DC | PRN

## 2016-04-10 NOTE — Progress Notes (Signed)
Discharge instructions reviewed with patient.  All questions answered.  Follow up appointment scheduled per pt.

## 2016-04-10 NOTE — Discharge Summary (Signed)
Gynecology Physician Postoperative Discharge Summary  Patient ID: TAIASHA HAYLEY MRN: [id number] DOB/AGE: Jul 29, 1946 70 y.o.  Admit Date: 04/09/2016 Discharge Date: 04/10/2016  Preoperative Diagnoses: Pelvic organ Prolapse  Procedures: TVH, LS, AR  Significant Labs: CBC Latest Ref Rng & Units 04/09/2016 04/09/2016 04/03/2016  WBC 3.6 - 11.0 K/uL 13.5(H) - 6.9  Hemoglobin 12.0 - 16.0 g/dL 12.3 14.6 14.2  Hematocrit 35.0 - 47.0 % 36.8 43.0 42.1  Platelets 150 - 440 K/uL 250 - 271    Hospital Course:  LEXEE BOLDON is a 70 y.o. admitted after scheduled surgery for Urinary Retention and Hyponatremia.  She underwent the procedures as mentioned above, her operation was uncomplicated. For further details about surgery, please refer to the operative report. Patient had an uncomplicated postoperative course. By time of discharge on POD#1, her pain was controlled on oral pain medications; she was ambulating, voiding without difficulty, tolerating regular diet and passing flatus. She was deemed stable for discharge to home.   Discharge Exam: Blood pressure 109/60, pulse 73, temperature 98 F (36.7 C), temperature source Axillary, resp. rate 18, height 5\' 1"  (1.549 m), weight 126 lb (57.2 kg), SpO2 98 %. General appearance: alert and no distress  Resp: clear to auscultation bilaterally  Cardio: regular rate and rhythm  GI: soft, non-tender; bowel sounds normal; no masses, no organomegaly.  Extremities: extremities normal, atraumatic, no cyanosis or edema and Homans sign is negative, no sign of DVT  Discharged Condition: Stable  Disposition: 01-Home or Self Care     Medication List    TAKE these medications   acetaminophen 500 MG tablet Commonly known as:  TYLENOL Take 1,000 mg by mouth every 6 (six) hours as needed.   albuterol 108 (90 Base) MCG/ACT inhaler Commonly known as:  PROVENTIL HFA;VENTOLIN HFA Inhale 2 puffs into the lungs every 6 (six) hours as needed for wheezing.    aspirin EC 81 MG tablet Take 81 mg by mouth daily.   BEN GAY EX Apply 1 application topically 3 (three) times daily.   BENADRYL ALLERGY PO Take 2 tablets by mouth at bedtime as needed. For allergies   benzonatate 100 MG capsule Commonly known as:  TESSALON Take 100-200 mg by mouth 3 (three) times daily as needed for cough.   BONIVA 150 MG tablet Generic drug:  ibandronate Take 150 mg by mouth every 30 (thirty) days. Take in the morning with a full glass of water, on an empty stomach, and do not take anything else by mouth or lie down for the next 30 min.   CALCIUM 600 + D PO Take 1 tablet by mouth 2 (two) times daily.   cholecalciferol 1000 units tablet Commonly known as:  VITAMIN D Take 1,000 Units by mouth daily.   citalopram 10 MG tablet Commonly known as:  CELEXA Take 5 mg by mouth as needed.   cyclobenzaprine 10 MG tablet Commonly known as:  FLEXERIL Take 5 mg by mouth 2 (two) times daily as needed for muscle spasms.   diclofenac sodium 1 % Gel Commonly known as:  VOLTAREN Apply topically 3 (three) times daily as needed.   diltiazem 120 MG 24 hr capsule Commonly known as:  DILACOR XR Take 120 mg by mouth at bedtime.   docusate sodium 100 MG capsule Commonly known as:  COLACE Take 100-300 mg by mouth at bedtime as needed for constipation.   EPIPEN IJ Inject as directed.   ESTRACE VAGINAL 0.1 MG/GM vaginal cream Generic drug:  estradiol Place 1 Applicatorful  vaginally daily.   fenofibrate 160 MG tablet Take 160 mg by mouth at bedtime.   fluticasone 50 MCG/ACT nasal spray Commonly known as:  FLONASE Place 2 sprays into the nose daily.   folic acid A999333 MCG tablet Commonly known as:  FOLVITE Take 400 mcg by mouth daily.   HYDROmorphone 2 MG tablet Commonly known as:  DILAUDID Take 1 tablet (2 mg total) by mouth every 4 (four) hours as needed for moderate pain or severe pain.   levocetirizine 5 MG tablet Commonly known as:  XYZAL Take 5 mg by mouth  daily as needed.   Levothyroxine Sodium 75 MCG Caps Take 75 mcg by mouth daily before breakfast.   Magnesium 250 MG Tabs Take 500 mg by mouth daily.   Melatonin 5 MG Tabs Take 1 tablet by mouth at bedtime as needed. For sleep   multivitamin with minerals tablet Take 1 tablet by mouth daily.   oxyCODONE-acetaminophen 5-325 MG tablet Commonly known as:  PERCOCET Take 1 tablet by mouth every 4 (four) hours as needed for moderate pain or severe pain.   pantoprazole 40 MG tablet Commonly known as:  PROTONIX Take 40 mg by mouth as needed.   TRILIPIX 135 MG capsule Generic drug:  Choline Fenofibrate Take 135 mg by mouth daily.   UNABLE TO FIND Allergy injections   vitamin C 500 MG tablet Commonly known as:  ASCORBIC ACID Take 500 mg by mouth daily.   zinc gluconate 50 MG tablet Take 50 mg by mouth daily.        Barnett Applebaum, MD

## 2016-04-10 NOTE — Care Management Obs Status (Signed)
[facility] NOTIFICATION   Patient Details  Name: Vanessa Evans MRN: [id number] Date of Birth: September 09, 1945   Medicare Observation Status Notification Given:  Yes    Shelbie Ammons, RN 04/10/2016, 11:07 AM

## 2016-04-14 ENCOUNTER — Other Ambulatory Visit: Payer: Self-pay

## 2016-04-15 ENCOUNTER — Emergency Department: Payer: Medicare Other

## 2016-04-15 ENCOUNTER — Encounter: Payer: Self-pay | Admitting: Emergency Medicine

## 2016-04-15 ENCOUNTER — Observation Stay
Admission: EM | Admit: 2016-04-15 | Discharge: 2016-04-18 | Disposition: A | Discharge status: Home / Self Care | Payer: Medicare Other | Attending: Internal Medicine | Admitting: Internal Medicine

## 2016-04-15 DIAGNOSIS — K219 Gastro-esophageal reflux disease without esophagitis: Secondary | ICD-10-CM | POA: Diagnosis not present

## 2016-04-15 DIAGNOSIS — M1990 Unspecified osteoarthritis, unspecified site: Secondary | ICD-10-CM | POA: Insufficient documentation

## 2016-04-15 DIAGNOSIS — R109 Unspecified abdominal pain: Secondary | ICD-10-CM

## 2016-04-15 DIAGNOSIS — Z886 Allergy status to analgesic agent status: Secondary | ICD-10-CM | POA: Diagnosis not present

## 2016-04-15 DIAGNOSIS — K7689 Other specified diseases of liver: Secondary | ICD-10-CM | POA: Insufficient documentation

## 2016-04-15 DIAGNOSIS — Z9842 Cataract extraction status, left eye: Secondary | ICD-10-CM | POA: Insufficient documentation

## 2016-04-15 DIAGNOSIS — Z8249 Family history of ischemic heart disease and other diseases of the circulatory system: Secondary | ICD-10-CM | POA: Insufficient documentation

## 2016-04-15 DIAGNOSIS — J45909 Unspecified asthma, uncomplicated: Secondary | ICD-10-CM | POA: Diagnosis not present

## 2016-04-15 DIAGNOSIS — Z96651 Presence of right artificial knee joint: Secondary | ICD-10-CM | POA: Insufficient documentation

## 2016-04-15 DIAGNOSIS — E039 Hypothyroidism, unspecified: Secondary | ICD-10-CM | POA: Diagnosis not present

## 2016-04-15 DIAGNOSIS — R000 Tachycardia, unspecified: Secondary | ICD-10-CM

## 2016-04-15 DIAGNOSIS — I471 Supraventricular tachycardia: Secondary | ICD-10-CM | POA: Insufficient documentation

## 2016-04-15 DIAGNOSIS — Z90710 Acquired absence of both cervix and uterus: Secondary | ICD-10-CM | POA: Diagnosis not present

## 2016-04-15 DIAGNOSIS — F419 Anxiety disorder, unspecified: Secondary | ICD-10-CM | POA: Diagnosis not present

## 2016-04-15 DIAGNOSIS — Z87891 Personal history of nicotine dependence: Secondary | ICD-10-CM | POA: Diagnosis not present

## 2016-04-15 DIAGNOSIS — R0602 Shortness of breath: Principal | ICD-10-CM | POA: Insufficient documentation

## 2016-04-15 DIAGNOSIS — R0600 Dyspnea, unspecified: Secondary | ICD-10-CM | POA: Diagnosis present

## 2016-04-15 DIAGNOSIS — Z9889 Other specified postprocedural states: Secondary | ICD-10-CM | POA: Insufficient documentation

## 2016-04-15 DIAGNOSIS — R1030 Lower abdominal pain, unspecified: Secondary | ICD-10-CM | POA: Diagnosis not present

## 2016-04-15 DIAGNOSIS — Z882 Allergy status to sulfonamides status: Secondary | ICD-10-CM | POA: Insufficient documentation

## 2016-04-15 DIAGNOSIS — E782 Mixed hyperlipidemia: Secondary | ICD-10-CM | POA: Insufficient documentation

## 2016-04-15 DIAGNOSIS — R079 Chest pain, unspecified: Secondary | ICD-10-CM | POA: Diagnosis present

## 2016-04-15 DIAGNOSIS — Z808 Family history of malignant neoplasm of other organs or systems: Secondary | ICD-10-CM | POA: Insufficient documentation

## 2016-04-15 DIAGNOSIS — Z885 Allergy status to narcotic agent status: Secondary | ICD-10-CM | POA: Diagnosis not present

## 2016-04-15 DIAGNOSIS — G43909 Migraine, unspecified, not intractable, without status migrainosus: Secondary | ICD-10-CM | POA: Insufficient documentation

## 2016-04-15 DIAGNOSIS — Z881 Allergy status to other antibiotic agents status: Secondary | ICD-10-CM | POA: Insufficient documentation

## 2016-04-15 DIAGNOSIS — I10 Essential (primary) hypertension: Secondary | ICD-10-CM | POA: Diagnosis not present

## 2016-04-15 DIAGNOSIS — D1721 Benign lipomatous neoplasm of skin and subcutaneous tissue of right arm: Secondary | ICD-10-CM | POA: Diagnosis not present

## 2016-04-15 DIAGNOSIS — E119 Type 2 diabetes mellitus without complications: Secondary | ICD-10-CM | POA: Diagnosis not present

## 2016-04-15 DIAGNOSIS — Z836 Family history of other diseases of the respiratory system: Secondary | ICD-10-CM | POA: Insufficient documentation

## 2016-04-15 DIAGNOSIS — F329 Major depressive disorder, single episode, unspecified: Secondary | ICD-10-CM | POA: Diagnosis not present

## 2016-04-15 LAB — URINALYSIS COMPLETE WITH MICROSCOPIC (ARMC ONLY)
BACTERIA UA: NONE SEEN
Bilirubin Urine: NEGATIVE
Glucose, UA: NEGATIVE mg/dL
Ketones, ur: NEGATIVE mg/dL
Nitrite: NEGATIVE
PROTEIN: NEGATIVE mg/dL
SPECIFIC GRAVITY, URINE: 1.003 — AB (ref 1.005–1.030)
SQUAMOUS EPITHELIAL / LPF: NONE SEEN
pH: 7 (ref 5.0–8.0)

## 2016-04-15 LAB — CBC WITH DIFFERENTIAL/PLATELET
BASOS PCT: 1 %
Basophils Absolute: 0.1 10*3/uL (ref 0–0.1)
EOS ABS: 0.1 10*3/uL (ref 0–0.7)
Eosinophils Relative: 2 %
HEMATOCRIT: 34 % — AB (ref 35.0–47.0)
HEMOGLOBIN: 11.7 g/dL — AB (ref 12.0–16.0)
LYMPHS ABS: 1.2 10*3/uL (ref 1.0–3.6)
Lymphocytes Relative: 14 %
MCH: 29.6 pg (ref 26.0–34.0)
MCHC: 34.3 g/dL (ref 32.0–36.0)
MCV: 86.2 fL (ref 80.0–100.0)
Monocytes Absolute: 1.1 10*3/uL — ABNORMAL HIGH (ref 0.2–0.9)
Monocytes Relative: 12 %
NEUTROS ABS: 6 10*3/uL (ref 1.4–6.5)
NEUTROS PCT: 71 %
Platelets: 301 10*3/uL (ref 150–440)
RBC: 3.95 MIL/uL (ref 3.80–5.20)
RDW: 14.1 % (ref 11.5–14.5)
WBC: 8.5 10*3/uL (ref 3.6–11.0)

## 2016-04-15 LAB — COMPREHENSIVE METABOLIC PANEL
ALBUMIN: 4.2 g/dL (ref 3.5–5.0)
ALK PHOS: 34 U/L — AB (ref 38–126)
ALT: 21 U/L (ref 14–54)
AST: 22 U/L (ref 15–41)
Anion gap: 7 (ref 5–15)
BILIRUBIN TOTAL: 0.7 mg/dL (ref 0.3–1.2)
BUN: 16 mg/dL (ref 6–20)
CALCIUM: 9.4 mg/dL (ref 8.9–10.3)
CO2: 24 mmol/L (ref 22–32)
CREATININE: 0.6 mg/dL (ref 0.44–1.00)
Chloride: 108 mmol/L (ref 101–111)
GFR calc Af Amer: >60 mL/min (ref >60)
GFR calc non Af Amer: >60 mL/min (ref >60)
GLUCOSE: 103 mg/dL — AB (ref 65–99)
Potassium: 3.3 mmol/L — ABNORMAL LOW (ref 3.5–5.1)
SODIUM: 139 mmol/L (ref 135–145)
Total Protein: 6.8 g/dL (ref 6.5–8.1)

## 2016-04-15 LAB — BRAIN NATRIURETIC PEPTIDE: B Natriuretic Peptide: 26 pg/mL (ref 0.0–100.0)

## 2016-04-15 LAB — TROPONIN I: Troponin I: <0.03 ng/mL (ref <0.03)

## 2016-04-15 MED ORDER — IPRATROPIUM-ALBUTEROL 0.5-2.5 (3) MG/3ML IN SOLN
3.0000 mL | Freq: Once | RESPIRATORY_TRACT | Status: AC
  Administered 2016-04-15: 3 mL via RESPIRATORY_TRACT
  Filled 2016-04-15: qty 3

## 2016-04-15 MED ORDER — IOPAMIDOL (ISOVUE-370) INJECTION 76%
75.0000 mL | Freq: Once | INTRAVENOUS | Status: AC | PRN
  Administered 2016-04-15: 75 mL via INTRAVENOUS

## 2016-04-15 MED ORDER — SODIUM CHLORIDE 0.9 % IV BOLUS (SEPSIS)
500.0000 mL | Freq: Once | INTRAVENOUS | Status: AC
  Administered 2016-04-15: 500 mL via INTRAVENOUS

## 2016-04-15 NOTE — ED Triage Notes (Signed)
Pt had vaginal hysterectomy on Thursday today developed acute shob. Pt has hx of mild asthma no other hx, also co chest pain. Called Dr. Georgianne Fick and was told to come to ER for rule out PE.

## 2016-04-15 NOTE — ED Provider Notes (Signed)
[facility] Emergency Department Provider Note    First MD Initiated Contact with Patient 04/15/16 2038     (approximate)  I have reviewed the triage vital signs and the nursing notes.   HISTORY  Chief Complaint Shortness of Breath    HPI Vanessa Evans is a 70 y.o. female status post hysterectomy last week presents with sudden onset of shortness of breath and tachycardia. States that she woke up this morning feeling more short of breath and this evening became acutely short of breath. Denies any pleuritic discomfort. No recent fevers. No cough. Does have a dull achy central chest pain without radiation to the back. Denies any abdominal pain at the ordinary status post surgery. Denies any dysuria. Patient called for OB/GYN on-call who directed her to the ER. She still states she has a history of mild asthma but very rarely uses her inhalers.   Past Medical History:  Diagnosis Date  . Anxiety   . Arthritis    osteo  . Asthma    needs rescue inhaler few times a year  . Depression   . Dizziness   . Dysrhythmia    tachycardia  . GERD (gastroesophageal reflux disease)   . Headache(784.0)   . Heart murmur    aortic  . Hyperlipidemia   . Hypertension   . Migraine   . Orthopnea   . Pneumonia    hx  . PONV (postoperative nausea and vomiting)    very anxious during cataract surgery  . Shortness of breath    any exertion, cannot lie flat    Patient Active Problem List   Diagnosis Date Noted  . Uterine prolapse 04/09/2016  . Cystocele 04/09/2016  . Hyponatremia 04/09/2016    Past Surgical History:  Procedure Laterality Date  . BACK SURGERY    . BREAST SURGERY Right    lumpectomy  . CATARACT EXTRACTION W/PHACO Left 12/25/2014   Procedure: CATARACT EXTRACTION PHACO AND INTRAOCULAR LENS PLACEMENT (IOC);  Surgeon: Birder Robson, MD;  Location: [facility] ORS;  Service: Ophthalmology;  Laterality: Left;  [place] 00:32 AP% 20.0 CDE 6.49  . COLONOSCOPY  W/ BIOPSIES    . CYSTOCELE REPAIR N/A 04/09/2016   Procedure: ANTERIOR REPAIR (CYSTOCELE);  Surgeon: Gae Dry, MD;  Location: [facility] ORS;  Service: Gynecology;  Laterality: N/A;  . DIAGNOSTIC LAPAROSCOPY    . DILATION AND CURETTAGE OF UTERUS    . EYE SURGERY Bilateral   . HERNIA REPAIR Right 2008   Inguinal hernia Repair  . JOINT REPLACEMENT Right    knee  . KNEE ARTHROSCOPY Right   . LUMBAR LAMINECTOMY/DECOMPRESSION MICRODISCECTOMY Right 03/21/2013   Procedure: Right Lumbar five-Sacral one Laminectomy for Synovial Cyst;  Surgeon: Faythe Ghee, MD;  Location: [facility];  Service: Neurosurgery;  Laterality: Right;  right  . OOPHORECTOMY    . UNILATERAL SALPINGECTOMY Left 04/09/2016   Procedure: UNILATERAL SALPINGECTOMY;  Surgeon: Gae Dry, MD;  Location: [facility] ORS;  Service: Gynecology;  Laterality: Left;  Marland Kitchen VAGINAL HYSTERECTOMY N/A 04/09/2016   Procedure: HYSTERECTOMY VAGINAL;  Surgeon: Gae Dry, MD;  Location: [facility] ORS;  Service: Gynecology;  Laterality: N/A;    Prior to Admission medications   Medication Sig Start Date End Date Taking? Authorizing Provider  acetaminophen (TYLENOL) 500 MG tablet Take 1,000 mg by mouth every 6 (six) hours as needed.    Historical Provider, MD  albuterol (PROVENTIL HFA;VENTOLIN HFA) 108 (90 BASE) MCG/ACT inhaler Inhale 2 puffs into the lungs every 6 (  six) hours as needed for wheezing.    Historical Provider, MD  aspirin EC 81 MG tablet Take 81 mg by mouth daily.    Historical Provider, MD  benzonatate (TESSALON) 100 MG capsule Take 100-200 mg by mouth 3 (three) times daily as needed for cough.    Historical Provider, MD  Calcium Carbonate-Vitamin D (CALCIUM 600 + D PO) Take 1 tablet by mouth 2 (two) times daily.     Historical Provider, MD  cholecalciferol (VITAMIN D) 1000 UNITS tablet Take 1,000 Units by mouth daily.    Historical Provider, MD  Choline Fenofibrate (TRILIPIX) 135 MG capsule Take 135 mg by mouth daily.    Historical  Provider, MD  citalopram (CELEXA) 10 MG tablet Take 5 mg by mouth as needed.     Historical Provider, MD  cyclobenzaprine (FLEXERIL) 10 MG tablet Take 5 mg by mouth 2 (two) times daily as needed for muscle spasms.     Historical Provider, MD  diclofenac sodium (VOLTAREN) 1 % GEL Apply topically 3 (three) times daily as needed.    Historical Provider, MD  diltiazem (DILACOR XR) 120 MG 24 hr capsule Take 120 mg by mouth at bedtime.     Historical Provider, MD  DiphenhydrAMINE HCl (BENADRYL ALLERGY PO) Take 2 tablets by mouth at bedtime as needed. For allergies    Historical Provider, MD  docusate sodium (COLACE) 100 MG capsule Take 100-300 mg by mouth at bedtime as needed for constipation.    Historical Provider, MD  EPINEPHrine (EPIPEN IJ) Inject as directed.    Historical Provider, MD  estradiol (ESTRACE VAGINAL) 0.1 MG/GM vaginal cream Place 1 Applicatorful vaginally daily.    Historical Provider, MD  fenofibrate 160 MG tablet Take 160 mg by mouth at bedtime.     Historical Provider, MD  fluticasone (FLONASE) 50 MCG/ACT nasal spray Place 2 sprays into the nose daily.    Historical Provider, MD  folic acid (FOLVITE) A999333 MCG tablet Take 400 mcg by mouth daily.    Historical Provider, MD  HYDROmorphone (DILAUDID) 2 MG tablet Take 1 tablet (2 mg total) by mouth every 4 (four) hours as needed for moderate pain or severe pain. 04/09/16   Gae Dry, MD  ibandronate (BONIVA) 150 MG tablet Take 150 mg by mouth every 30 (thirty) days. Take in the morning with a full glass of water, on an empty stomach, and do not take anything else by mouth or lie down for the next 30 min.    Historical Provider, MD  levocetirizine (XYZAL) 5 MG tablet Take 5 mg by mouth daily as needed.     Historical Provider, MD  Levothyroxine Sodium 75 MCG CAPS Take 75 mcg by mouth daily before breakfast.     Historical Provider, MD  Liniments (BEN GAY EX) Apply 1 application topically 3 (three) times daily.    Historical Provider, MD   Magnesium 250 MG TABS Take 500 mg by mouth daily.     Historical Provider, MD  Melatonin 5 MG TABS Take 1 tablet by mouth at bedtime as needed. For sleep    Historical Provider, MD  Multiple Vitamins-Minerals (MULTIVITAMIN WITH MINERALS) tablet Take 1 tablet by mouth daily.    Historical Provider, MD  oxyCODONE-acetaminophen (PERCOCET) 5-325 MG tablet Take 1 tablet by mouth every 4 (four) hours as needed for moderate pain or severe pain. Patient not taking: Reported on 04/09/2016 04/09/16   Gae Dry, MD  pantoprazole (PROTONIX) 40 MG tablet Take 40 mg by mouth as  needed.     Historical Provider, MD  UNABLE TO FIND Allergy injections    Historical Provider, MD  vitamin C (ASCORBIC ACID) 500 MG tablet Take 500 mg by mouth daily.    Historical Provider, MD  zinc gluconate 50 MG tablet Take 50 mg by mouth daily.    Historical Provider, MD    Allergies Codeine; Ultram [tramadol]; Nitrofuran derivatives; Levaquin [levofloxacin in d5w]; Ultracet [tramadol-acetaminophen]; Ciprofloxacin; Nsaids; and Sulfa antibiotics  No family history on file.  Social History Social History  Substance Use Topics  . Smoking status: Former Research scientist (life sciences)  . Smokeless tobacco: Never Used  . Alcohol use Yes     Comment: occasional wine    Review of Systems Patient denies headaches, rhinorrhea, blurry vision, numbness, shortness of breath, chest pain, edema, cough, abdominal pain, nausea, vomiting, diarrhea, dysuria, fevers, rashes or hallucinations unless otherwise stated above in HPI. ____________________________________________   PHYSICAL EXAM:  VITAL SIGNS: Vitals:   04/15/16 2034  BP: (!) 158/75  Pulse: (!) 130  Resp: 18    Constitutional: Alert and oriented. Elderly appearing in mild respiratory distress  Eyes: Conjunctivae are normal. PERRL. EOMI. Head: Atraumatic. Nose: No congestion/rhinnorhea. Mouth/Throat: Mucous membranes are moist.  Oropharynx non-erythematous. Neck: No stridor. Painless  ROM. No cervical spine tenderness to palpation Hematological/Lymphatic/Immunilogical: No cervical lymphadenopathy. Cardiovascular: Normal rate, regular rhythm. Grossly normal heart sounds.  Good peripheral circulation. Respiratory: Tachypnea.  No retractions. Lungs CTAB. Gastrointestinal: Soft and nontender. No distention. No abdominal bruits. No CVA tenderness. Musculoskeletal: No lower extremity tenderness nor edema.  No joint effusions. Neurologic:  Normal speech and language. No gross focal neurologic deficits are appreciated. No gait instability. Skin:  Skin is warm, dry and intact. No rash noted. Psychiatric: Mood and affect are normal. Speech and behavior are normal.  ____________________________________________   LABS (all labs ordered are listed, but only abnormal results are displayed)  Results for orders placed or performed during the hospital encounter of 04/15/16 (from the past 24 hour(s))  Troponin I     Status: None   Collection Time: 04/15/16  9:45 PM  Result Value Ref Range   Troponin I <0.03 <0.03 ng/mL  CBC with Differential/Platelet     Status: Abnormal   Collection Time: 04/15/16  9:45 PM  Result Value Ref Range   WBC 8.5 3.6 - 11.0 K/uL   RBC 3.95 3.80 - 5.20 MIL/uL   Hemoglobin 11.7 (L) 12.0 - 16.0 g/dL   HCT 34.0 (L) 35.0 - 47.0 %   MCV 86.2 80.0 - 100.0 fL   MCH 29.6 26.0 - 34.0 pg   MCHC 34.3 32.0 - 36.0 g/dL   RDW 14.1 11.5 - 14.5 %   Platelets 301 150 - 440 K/uL   Neutrophils Relative % 71 %   Neutro Abs 6.0 1.4 - 6.5 K/uL   Lymphocytes Relative 14 %   Lymphs Abs 1.2 1.0 - 3.6 K/uL   Monocytes Relative 12 %   Monocytes Absolute 1.1 (H) 0.2 - 0.9 K/uL   Eosinophils Relative 2 %   Eosinophils Absolute 0.1 0 - 0.7 K/uL   Basophils Relative 1 %   Basophils Absolute 0.1 0 - 0.1 K/uL  Comprehensive metabolic panel     Status: Abnormal   Collection Time: 04/15/16  9:45 PM  Result Value Ref Range   Sodium 139 135 - 145 mmol/L   Potassium 3.3 (L) 3.5 -  5.1 mmol/L   Chloride 108 101 - 111 mmol/L   CO2 24 22 - 32  mmol/L   Glucose, Bld 103 (H) 65 - 99 mg/dL   BUN 16 6 - 20 mg/dL   Creatinine, Ser 0.60 0.44 - 1.00 mg/dL   Calcium 9.4 8.9 - 10.3 mg/dL   Total Protein 6.8 6.5 - 8.1 g/dL   Albumin 4.2 3.5 - 5.0 g/dL   AST 22 15 - 41 U/L   ALT 21 14 - 54 U/L   Alkaline Phosphatase 34 (L) 38 - 126 U/L   Total Bilirubin 0.7 0.3 - 1.2 mg/dL   GFR calc non Af Amer >60 >60 mL/min   GFR calc Af Amer >60 >60 mL/min   Anion gap 7 5 - 15  Brain natriuretic peptide     Status: None   Collection Time: 04/15/16  9:45 PM  Result Value Ref Range   B Natriuretic Peptide 26.0 0.0 - 100.0 pg/mL   ____________________________________________  EKG My review and personal interpretation at Time: 2042   Indication: sob  Rate: 115  Rhythm: sinus Axis: normalOther: poor r wave progression, no acute ischemia ____________________________________________  RADIOLOGY  CXR my read shows no evidence of acute cardiopulmonary process.  ____________________________________________   PROCEDURES  Procedure(s) performed: none    Critical Care performed: no ____________________________________________   INITIAL IMPRESSION / ASSESSMENT AND PLAN / ED COURSE  Pertinent labs & imaging results that were available during my care of the patient were reviewed by me and considered in my medical decision making (see chart for details).  DDX: Asthma, copd, CHF, pna, ptx, malignancy, Pe, anemia   Vanessa Evans is a 70 y.o. who presents to the ED with acute shortness of breath and chest pain presenting status post recent hysterectomy. Patient afebrile but tachycardic and obviously to. Patient moderate respiratory distress. Does have remote history of asthma but is never felt like this. We will start with empiric inhaler treatments to assess for improvement in symptoms. Patient is high risk for PE therefore will order CT imaging to further evaluate for pulmonary  embolism. EKG does not show any sign of acute ischemia. Troponin ordered to evaluate for acute ischemia as well as pro BNP ordered to evaluate for congestive heart failure are negative. She is afebrile therefore a lower suspicion for pneumonia.  The patient will be placed on continuous pulse oximetry and telemetry for monitoring.  Laboratory evaluation will be sent to evaluate for the above complaints.     Clinical Course   Patient with an improvement in heart rate with fluids as well as nebulizer treatments. CT imaging currently pending. Patient will be signed out to oncoming attending Dr. Owens Shark pending results of CT imaging.  Have discussed with the patient and available family all diagnostics and treatments performed thus far and all questions were answered to the best of my ability. The patient demonstrates understanding and agreement with plan.   ____________________________________________   FINAL CLINICAL IMPRESSION(S) / ED DIAGNOSES  Final diagnoses:  Acute dyspnea  Tachycardia  Chest pain, unspecified chest pain type      NEW MEDICATIONS STARTED DURING THIS VISIT:  New Prescriptions   No medications on file     Note:  This document was prepared using Dragon voice recognition software and may include unintentional dictation errors.    Merlyn Lot, MD 04/15/16 [phone]

## 2016-04-15 NOTE — ED Notes (Addendum)
Pt alert.  nsr on monitor.  Family at bedside.  Skin warm and dry.  Intermittent pain in center of chest per pt.

## 2016-04-16 ENCOUNTER — Observation Stay
Admit: 2016-04-16 | Discharge: 2016-04-16 | Disposition: A | Discharge status: Home / Self Care | Payer: Medicare Other | Attending: Internal Medicine | Admitting: Internal Medicine

## 2016-04-16 ENCOUNTER — Encounter: Payer: Self-pay | Admitting: Internal Medicine

## 2016-04-16 DIAGNOSIS — F419 Anxiety disorder, unspecified: Secondary | ICD-10-CM | POA: Diagnosis not present

## 2016-04-16 DIAGNOSIS — I479 Paroxysmal tachycardia, unspecified: Secondary | ICD-10-CM | POA: Diagnosis not present

## 2016-04-16 DIAGNOSIS — R0602 Shortness of breath: Secondary | ICD-10-CM | POA: Diagnosis present

## 2016-04-16 DIAGNOSIS — I10 Essential (primary) hypertension: Secondary | ICD-10-CM | POA: Diagnosis not present

## 2016-04-16 DIAGNOSIS — R000 Tachycardia, unspecified: Secondary | ICD-10-CM | POA: Diagnosis not present

## 2016-04-16 LAB — ECHOCARDIOGRAM COMPLETE
AV Peak grad: 13 mmHg
AV pk vel: 180 cm/s
AVAREAVTI: 1.88 cm2
AVLVOTPG: 6 mmHg
Ao pk vel: 0.66 m/s
CHL CUP AV PEAK INDEX: 1.22
FS: 34 % (ref 28–44)
HEIGHTINCHES: 61 in
IV/PV OW: 1.15
LA ID, A-P, ES: 29 mm
LA diam end sys: 29 mm
LA diam index: 1.87 cm/m2
LAVOLA4C: 20.8 mL
LV PW d: 12.1 mm — AB (ref 0.6–1.1)
LVOT area: 2.84 cm2
LVOT diameter: 19 mm
LVOT peak vel: 119 cm/s
RV TAPSE: 14.3 mm
TVPG: 222 mmHg
WEIGHTICAEL: 1944 [oz_av]

## 2016-04-16 LAB — HEMOGLOBIN A1C: Hgb A1c MFr Bld: 5.5 % (ref 4.0–6.0)

## 2016-04-16 LAB — TSH
TSH: 0.967 u[IU]/mL (ref 0.350–4.500)
TSH: 0.302 u[IU]/mL — ABNORMAL LOW (ref 0.350–4.500)

## 2016-04-16 LAB — T4, FREE: Free T4: 0.97 ng/dL (ref 0.61–1.12)

## 2016-04-16 LAB — TROPONIN I
Troponin I: <0.03 ng/mL (ref <0.03)
Troponin I: <0.03 ng/mL (ref <0.03)
Troponin I: <0.03 ng/mL (ref <0.03)

## 2016-04-16 LAB — MAGNESIUM: MAGNESIUM: 2.1 mg/dL (ref 1.7–2.4)

## 2016-04-16 MED ORDER — ACETAMINOPHEN 650 MG RE SUPP: 650.0000 mg | Freq: Four times a day (QID) | RECTAL | Status: DC | PRN

## 2016-04-16 MED ORDER — HYDROMORPHONE HCL 2 MG PO TABS: 1.0000 mg | ORAL_TABLET | ORAL | Status: DC | PRN

## 2016-04-16 MED ORDER — BENZONATATE 100 MG PO CAPS
100.0000 mg | ORAL_CAPSULE | Freq: Three times a day (TID) | ORAL | Status: DC | PRN
  Administered 2016-04-16 – 2016-04-18 (×3): 200 mg via ORAL
  Filled 2016-04-16 (×3): qty 2

## 2016-04-16 MED ORDER — POLYETHYLENE GLYCOL 3350 17 G PO PACK: 17.0000 g | PACK | Freq: Every day | ORAL | Status: DC | PRN

## 2016-04-16 MED ORDER — POTASSIUM CHLORIDE CRYS ER 20 MEQ PO TBCR
40.0000 meq | EXTENDED_RELEASE_TABLET | ORAL | Status: AC
  Administered 2016-04-16: 40 meq via ORAL
  Filled 2016-04-16: qty 2

## 2016-04-16 MED ORDER — ZINC GLUCONATE 50 MG PO TABS: 50.0000 mg | ORAL_TABLET | Freq: Every day | ORAL | Status: DC

## 2016-04-16 MED ORDER — CALCIUM CARBONATE-VITAMIN D 500-200 MG-UNIT PO TABS
1.0000 | ORAL_TABLET | Freq: Two times a day (BID) | ORAL | Status: DC
  Administered 2016-04-18: 1 via ORAL
  Filled 2016-04-16 (×4): qty 1

## 2016-04-16 MED ORDER — ONDANSETRON HCL 4 MG/2ML IJ SOLN: 4.0000 mg | Freq: Four times a day (QID) | INTRAMUSCULAR | Status: DC | PRN

## 2016-04-16 MED ORDER — DIPHENHYDRAMINE HCL 25 MG PO CAPS
25.0000 mg | ORAL_CAPSULE | Freq: Every evening | ORAL | Status: DC | PRN
  Administered 2016-04-17: 25 mg via ORAL
  Filled 2016-04-16 (×2): qty 1

## 2016-04-16 MED ORDER — SODIUM CHLORIDE 0.9 % IV SOLN
INTRAVENOUS | Status: DC
  Administered 2016-04-16 – 2016-04-17 (×2): via INTRAVENOUS

## 2016-04-16 MED ORDER — CYCLOBENZAPRINE HCL 10 MG PO TABS: 5.0000 mg | ORAL_TABLET | Freq: Two times a day (BID) | ORAL | Status: DC | PRN

## 2016-04-16 MED ORDER — ACETAMINOPHEN 325 MG PO TABS
650.0000 mg | ORAL_TABLET | Freq: Four times a day (QID) | ORAL | Status: DC | PRN
  Administered 2016-04-16: 650 mg via ORAL
  Administered 2016-04-17 (×2): 325 mg via ORAL
  Administered 2016-04-18: 650 mg via ORAL
  Filled 2016-04-16 (×3): qty 2

## 2016-04-16 MED ORDER — ESTRADIOL 0.1 MG/GM VA CREA
1.0000 | TOPICAL_CREAM | Freq: Every day | VAGINAL | Status: DC
  Filled 2016-04-16: qty 42.5

## 2016-04-16 MED ORDER — MAGNESIUM OXIDE 400 (241.3 MG) MG PO TABS
400.0000 mg | ORAL_TABLET | Freq: Every day | ORAL | Status: DC
  Filled 2016-04-16 (×2): qty 1

## 2016-04-16 MED ORDER — VITAMIN C 500 MG PO TABS
500.0000 mg | ORAL_TABLET | Freq: Every day | ORAL | Status: DC
  Filled 2016-04-16: qty 1

## 2016-04-16 MED ORDER — MAGNESIUM 250 MG PO TABS: 500.0000 mg | ORAL_TABLET | Freq: Every day | ORAL | Status: DC

## 2016-04-16 MED ORDER — MELATONIN 5 MG PO TABS
1.0000 | ORAL_TABLET | Freq: Every day | ORAL | Status: DC
  Administered 2016-04-16 – 2016-04-17 (×2): 5 mg via ORAL
  Filled 2016-04-16 (×3): qty 1

## 2016-04-16 MED ORDER — PANTOPRAZOLE SODIUM 40 MG PO TBEC
40.0000 mg | DELAYED_RELEASE_TABLET | Freq: Every day | ORAL | Status: DC
  Administered 2016-04-16 – 2016-04-17 (×2): 40 mg via ORAL
  Filled 2016-04-16 (×3): qty 1

## 2016-04-16 MED ORDER — CITALOPRAM HYDROBROMIDE 10 MG PO TABS
5.0000 mg | ORAL_TABLET | Freq: Every day | ORAL | Status: DC
  Filled 2016-04-16: qty 1

## 2016-04-16 MED ORDER — FENOFIBRATE 160 MG PO TABS
160.0000 mg | ORAL_TABLET | Freq: Every day | ORAL | Status: DC
  Administered 2016-04-16 – 2016-04-17 (×2): 160 mg via ORAL
  Filled 2016-04-16 (×2): qty 1

## 2016-04-16 MED ORDER — ZINC SULFATE 220 (50 ZN) MG PO CAPS
220.0000 mg | ORAL_CAPSULE | Freq: Every day | ORAL | Status: DC
  Filled 2016-04-16: qty 1

## 2016-04-16 MED ORDER — DILTIAZEM HCL ER COATED BEADS 120 MG PO CP24
120.0000 mg | ORAL_CAPSULE | Freq: Every day | ORAL | Status: DC
  Administered 2016-04-16 – 2016-04-17 (×2): 120 mg via ORAL
  Filled 2016-04-16 (×2): qty 1

## 2016-04-16 MED ORDER — FOLIC ACID 1 MG PO TABS
0.5000 mg | ORAL_TABLET | Freq: Every day | ORAL | Status: DC
  Filled 2016-04-16: qty 1

## 2016-04-16 MED ORDER — FOLIC ACID 400 MCG PO TABS: 400.0000 ug | ORAL_TABLET | Freq: Every day | ORAL | Status: DC

## 2016-04-16 MED ORDER — VITAMIN D 1000 UNITS PO TABS
1000.0000 [IU] | ORAL_TABLET | Freq: Every day | ORAL | Status: DC
  Filled 2016-04-16 (×2): qty 1

## 2016-04-16 MED ORDER — ASPIRIN EC 81 MG PO TBEC
81.0000 mg | DELAYED_RELEASE_TABLET | Freq: Every day | ORAL | Status: DC
  Administered 2016-04-17 – 2016-04-18 (×2): 81 mg via ORAL
  Filled 2016-04-16 (×3): qty 1

## 2016-04-16 MED ORDER — DOCUSATE SODIUM 100 MG PO CAPS
100.0000 mg | ORAL_CAPSULE | Freq: Two times a day (BID) | ORAL | Status: DC
  Administered 2016-04-16 – 2016-04-18 (×5): 100 mg via ORAL
  Filled 2016-04-16 (×5): qty 1

## 2016-04-16 MED ORDER — ADULT MULTIVITAMIN W/MINERALS CH
1.0000 | ORAL_TABLET | Freq: Every day | ORAL | Status: DC
  Filled 2016-04-16: qty 1

## 2016-04-16 MED ORDER — HYDROCODONE-ACETAMINOPHEN 5-325 MG PO TABS
1.0000 | ORAL_TABLET | ORAL | Status: DC | PRN
  Administered 2016-04-17: 1 via ORAL
  Filled 2016-04-16: qty 1

## 2016-04-16 MED ORDER — ALBUTEROL SULFATE (2.5 MG/3ML) 0.083% IN NEBU
5.0000 mg | INHALATION_SOLUTION | Freq: Once | RESPIRATORY_TRACT | Status: AC
  Administered 2016-04-16: 5 mg via RESPIRATORY_TRACT
  Filled 2016-04-16: qty 6

## 2016-04-16 MED ORDER — SODIUM CHLORIDE 0.9% FLUSH
3.0000 mL | Freq: Two times a day (BID) | INTRAVENOUS | Status: DC
  Administered 2016-04-16 – 2016-04-18 (×4): 3 mL via INTRAVENOUS

## 2016-04-16 MED ORDER — ONDANSETRON HCL 4 MG PO TABS: 4.0000 mg | ORAL_TABLET | Freq: Four times a day (QID) | ORAL | Status: DC | PRN

## 2016-04-16 MED ORDER — LEVOCETIRIZINE DIHYDROCHLORIDE 5 MG PO TABS: 5.0000 mg | ORAL_TABLET | Freq: Every day | ORAL | Status: DC | PRN

## 2016-04-16 MED ORDER — ENOXAPARIN SODIUM 40 MG/0.4ML ~~LOC~~ SOLN
40.0000 mg | SUBCUTANEOUS | Status: DC
  Administered 2016-04-16 – 2016-04-17 (×2): 40 mg via SUBCUTANEOUS
  Filled 2016-04-16 (×2): qty 0.4

## 2016-04-16 MED ORDER — DEXAMETHASONE SODIUM PHOSPHATE 10 MG/ML IJ SOLN
10.0000 mg | Freq: Once | INTRAMUSCULAR | Status: AC
  Administered 2016-04-16: 10 mg via INTRAVENOUS
  Filled 2016-04-16: qty 1

## 2016-04-16 MED ORDER — ALBUTEROL SULFATE (2.5 MG/3ML) 0.083% IN NEBU
3.0000 mL | INHALATION_SOLUTION | Freq: Four times a day (QID) | RESPIRATORY_TRACT | Status: DC | PRN
  Administered 2016-04-16: 3 mL via RESPIRATORY_TRACT
  Filled 2016-04-16: qty 3

## 2016-04-16 MED ORDER — CETIRIZINE HCL 10 MG PO TABS
10.0000 mg | ORAL_TABLET | Freq: Every day | ORAL | Status: DC | PRN
  Administered 2016-04-16 – 2016-04-17 (×2): 10 mg via ORAL
  Filled 2016-04-16 (×2): qty 1

## 2016-04-16 MED ORDER — LEVOTHYROXINE SODIUM 75 MCG PO TABS
75.0000 ug | ORAL_TABLET | Freq: Every day | ORAL | Status: DC
  Administered 2016-04-16 – 2016-04-18 (×3): 75 ug via ORAL
  Filled 2016-04-16 (×3): qty 1

## 2016-04-16 NOTE — ED Notes (Signed)
Assisted up to bathroom.  Steady gait.

## 2016-04-16 NOTE — ED Notes (Signed)
Dr Diamond at bedside. 

## 2016-04-16 NOTE — Care Management Obs Status (Signed)
[place] NOTIFICATION   Patient Details  Name: Vanessa Evans MRN: [id number] Date of Birth: 12/25/1945   Medicare Observation Status Notification Given:  Yes    Jolly Mango, RN 04/16/2016, 2:21 PM

## 2016-04-16 NOTE — H&P (Signed)
Vanessa Evans is an 70 y.o. female.   Chief Complaint: Shortness of breath HPI: The patient with past medical history of tachycardia, hypothyroidism and anxiety presents to the emergency department with acute onset shortness of breath. The patient underwent abdominal hysterectomy, anterior cystocele repair and unilateral salpingo-oophorectomy 6 days ago. She had been recovering at home when 2 days ago she felt mildly short of breath. Notably, she has a history of shortness of breath and asthma. She also has a history of baseline tachycardia and is followed by Dr. Nehemiah Massed. Today, her shortness of breath was so dramatic that she states that she was unable to speak without being short of breath. In the emergency department EKG and chest x-ray revealed no acute cardiopulmonary problem. CTA of the chest was negative for pulmonary embolism. After some fluid the patient's heart rate improved from 132 to 110s but she remained mildly short of breath and tachypneic. She denies chest pain although occasionally states she feels some tightness in the center of her chest. The emergency department staff was concerned that her symptoms may be due to anginal equivalents and called the hospitalist service for further evaluation.  Past Medical History:  Diagnosis Date  . Anxiety   . Arthritis    osteo  . Asthma    needs rescue inhaler few times a year  . Depression   . Dizziness   . Dysrhythmia    tachycardia  . GERD (gastroesophageal reflux disease)   . Headache(784.0)   . Heart murmur    aortic  . Hyperlipidemia   . Hypertension   . Migraine   . Orthopnea   . Pneumonia    hx  . PONV (postoperative nausea and vomiting)    very anxious during cataract surgery  . Shortness of breath    any exertion, cannot lie flat    Past Surgical History:  Procedure Laterality Date  . BACK SURGERY    . BREAST SURGERY Right    lumpectomy  . CATARACT EXTRACTION W/PHACO Left 12/25/2014   Procedure: CATARACT  EXTRACTION PHACO AND INTRAOCULAR LENS PLACEMENT (IOC);  Surgeon: Birder Robson, MD;  Location: [facility] ORS;  Service: Ophthalmology;  Laterality: Left;  [place] 00:32 AP% 20.0 CDE 6.49  . COLONOSCOPY W/ BIOPSIES    . CYSTOCELE REPAIR N/A 04/09/2016   Procedure: ANTERIOR REPAIR (CYSTOCELE);  Surgeon: Gae Dry, MD;  Location: [facility] ORS;  Service: Gynecology;  Laterality: N/A;  . DIAGNOSTIC LAPAROSCOPY    . DILATION AND CURETTAGE OF UTERUS    . EYE SURGERY Bilateral   . HERNIA REPAIR Right 2008   Inguinal hernia Repair  . JOINT REPLACEMENT Right    knee  . KNEE ARTHROSCOPY Right   . LUMBAR LAMINECTOMY/DECOMPRESSION MICRODISCECTOMY Right 03/21/2013   Procedure: Right Lumbar five-Sacral one Laminectomy for Synovial Cyst;  Surgeon: Faythe Ghee, MD;  Location: [facility];  Service: Neurosurgery;  Laterality: Right;  right  . OOPHORECTOMY    . UNILATERAL SALPINGECTOMY Left 04/09/2016   Procedure: UNILATERAL SALPINGECTOMY;  Surgeon: Gae Dry, MD;  Location: [facility] ORS;  Service: Gynecology;  Laterality: Left;  Marland Kitchen VAGINAL HYSTERECTOMY N/A 04/09/2016   Procedure: HYSTERECTOMY VAGINAL;  Surgeon: Gae Dry, MD;  Location: [facility] ORS;  Service: Gynecology;  Laterality: N/A;    Family History  Problem Relation Age of Onset  . Pulmonary fibrosis Mother   . Melanoma Father   . Cardiomyopathy Sister     and arrhythmia   Social History:  reports that she has quit  smoking. She has never used smokeless tobacco. She reports that she drinks alcohol. She reports that she does not use drugs.  Allergies:  Allergies  Allergen Reactions  . Codeine Shortness Of Breath and Itching    Bronchospasms and asthma attach  . Ultram [Tramadol] Itching and Other (See Comments)    Bronchospasm and asthma attack  . Nitrofuran Derivatives Diarrhea    Severe and long lasting  . Levaquin [Levofloxacin In D5w] Other (See Comments)    Sensation of being drunk, confused, caused a ruptured tendon in foot  .  Ultracet [Tramadol-Acetaminophen] Itching  . Ciprofloxacin Other (See Comments)    Severe pain in neck and down back   . Nsaids Other (See Comments)    Bloody stools, abdominal pain, can tolerate naproxen in low doses.  . Sulfa Antibiotics Itching, Rash and Other (See Comments)    fever    Medications Prior to Admission  Medication Sig Dispense Refill  . acetaminophen (TYLENOL) 500 MG tablet Take 1,000 mg by mouth every 6 (six) hours as needed.    Marland Kitchen albuterol (PROVENTIL HFA;VENTOLIN HFA) 108 (90 BASE) MCG/ACT inhaler Inhale 2 puffs into the lungs every 6 (six) hours as needed for wheezing.    Marland Kitchen aspirin EC 81 MG tablet Take 81 mg by mouth daily.    . benzonatate (TESSALON) 100 MG capsule Take 100-200 mg by mouth 3 (three) times daily as needed for cough.    . Calcium Carbonate-Vitamin D (CALCIUM 600 + D PO) Take 1 tablet by mouth 2 (two) times daily.     . cholecalciferol (VITAMIN D) 1000 UNITS tablet Take 1,000 Units by mouth daily.    . cyclobenzaprine (FLEXERIL) 10 MG tablet Take 5 mg by mouth 2 (two) times daily as needed for muscle spasms.     . diclofenac sodium (VOLTAREN) 1 % GEL Apply topically 3 (three) times daily as needed.    . diltiazem (DILACOR XR) 120 MG 24 hr capsule Take 120 mg by mouth at bedtime.     . DiphenhydrAMINE HCl (BENADRYL ALLERGY PO) Take 2 tablets by mouth at bedtime as needed. For allergies    . docusate sodium (COLACE) 100 MG capsule Take 100-300 mg by mouth at bedtime as needed for constipation.    Marland Kitchen EPINEPHrine (EPIPEN IJ) Inject as directed.    Marland Kitchen estradiol (ESTRACE VAGINAL) 0.1 MG/GM vaginal cream Place 1 Applicatorful vaginally daily.    . fenofibrate 160 MG tablet Take 160 mg by mouth at bedtime.     . folic acid (FOLVITE) 518 MCG tablet Take 400 mcg by mouth daily.    Marland Kitchen HYDROmorphone (DILAUDID) 2 MG tablet Take 1 tablet (2 mg total) by mouth every 4 (four) hours as needed for moderate pain or severe pain. 40 tablet 0  . ibandronate (BONIVA) 150 MG  tablet Take 150 mg by mouth every 30 (thirty) days. Take in the morning with a full glass of water, on an empty stomach, and do not take anything else by mouth or lie down for the next 30 min.    Marland Kitchen levocetirizine (XYZAL) 5 MG tablet Take 5 mg by mouth daily as needed.     . Levothyroxine Sodium 75 MCG CAPS Take 75 mcg by mouth daily before breakfast.     . Liniments (BEN GAY EX) Apply 1 application topically 3 (three) times daily.    . Magnesium 250 MG TABS Take 500 mg by mouth daily.     . Melatonin 5 MG TABS Take 1 tablet  by mouth at bedtime as needed. For sleep    . Multiple Vitamins-Minerals (MULTIVITAMIN WITH MINERALS) tablet Take 1 tablet by mouth daily.    . pantoprazole (PROTONIX) 40 MG tablet Take 40 mg by mouth as needed.     Marland Kitchen UNABLE TO FIND Allergy injections    . vitamin C (ASCORBIC ACID) 500 MG tablet Take 500 mg by mouth daily.    Marland Kitchen zinc gluconate 50 MG tablet Take 50 mg by mouth daily.    . citalopram (CELEXA) 10 MG tablet Take 5 mg by mouth as needed.       Results for orders placed or performed during the hospital encounter of 04/15/16 (from the past 48 hour(s))  Troponin I     Status: None   Collection Time: 04/15/16  9:45 PM  Result Value Ref Range   Troponin I <0.03 <0.03 ng/mL  CBC with Differential/Platelet     Status: Abnormal   Collection Time: 04/15/16  9:45 PM  Result Value Ref Range   WBC 8.5 3.6 - 11.0 K/uL   RBC 3.95 3.80 - 5.20 MIL/uL   Hemoglobin 11.7 (L) 12.0 - 16.0 g/dL   HCT 34.0 (L) 35.0 - 47.0 %   MCV 86.2 80.0 - 100.0 fL   MCH 29.6 26.0 - 34.0 pg   MCHC 34.3 32.0 - 36.0 g/dL   RDW 14.1 11.5 - 14.5 %   Platelets 301 150 - 440 K/uL   Neutrophils Relative % 71 %   Neutro Abs 6.0 1.4 - 6.5 K/uL   Lymphocytes Relative 14 %   Lymphs Abs 1.2 1.0 - 3.6 K/uL   Monocytes Relative 12 %   Monocytes Absolute 1.1 (H) 0.2 - 0.9 K/uL   Eosinophils Relative 2 %   Eosinophils Absolute 0.1 0 - 0.7 K/uL   Basophils Relative 1 %   Basophils Absolute 0.1 0 -  0.1 K/uL  Comprehensive metabolic panel     Status: Abnormal   Collection Time: 04/15/16  9:45 PM  Result Value Ref Range   Sodium 139 135 - 145 mmol/L   Potassium 3.3 (L) 3.5 - 5.1 mmol/L   Chloride 108 101 - 111 mmol/L   CO2 24 22 - 32 mmol/L   Glucose, Bld 103 (H) 65 - 99 mg/dL   BUN 16 6 - 20 mg/dL   Creatinine, Ser 0.60 0.44 - 1.00 mg/dL   Calcium 9.4 8.9 - 10.3 mg/dL   Total Protein 6.8 6.5 - 8.1 g/dL   Albumin 4.2 3.5 - 5.0 g/dL   AST 22 15 - 41 U/L   ALT 21 14 - 54 U/L   Alkaline Phosphatase 34 (L) 38 - 126 U/L   Total Bilirubin 0.7 0.3 - 1.2 mg/dL   GFR calc non Af Amer >60 >60 mL/min   GFR calc Af Amer >60 >60 mL/min    Comment: (NOTE) The eGFR has been calculated using the CKD EPI equation. This calculation has not been validated in all clinical situations. eGFR's persistently <60 mL/min signify possible Chronic Kidney Disease.    Anion gap 7 5 - 15  Brain natriuretic peptide     Status: None   Collection Time: 04/15/16  9:45 PM  Result Value Ref Range   B Natriuretic Peptide 26.0 0.0 - 100.0 pg/mL  TSH     Status: None   Collection Time: 04/15/16  9:45 PM  Result Value Ref Range   TSH 0.967 0.350 - 4.500 uIU/mL  Magnesium     Status: None  Collection Time: 04/15/16  9:45 PM  Result Value Ref Range   Magnesium 2.1 1.7 - 2.4 mg/dL  T4, free     Status: None   Collection Time: 04/15/16  9:45 PM  Result Value Ref Range   Free T4 0.97 0.61 - 1.12 ng/dL    Comment: (NOTE) Biotin ingestion may interfere with free T4 tests. If the results are inconsistent with the TSH level, previous test results, or the clinical presentation, then consider biotin interference. If needed, order repeat testing after stopping biotin.   Urinalysis complete, with microscopic ([facility] only)     Status: Abnormal   Collection Time: 04/15/16 11:23 PM  Result Value Ref Range   Color, Urine STRAW (A) YELLOW   APPearance CLEAR (A) CLEAR   Glucose, UA NEGATIVE NEGATIVE mg/dL   Bilirubin  Urine NEGATIVE NEGATIVE   Ketones, ur NEGATIVE NEGATIVE mg/dL   Specific Gravity, Urine 1.003 (L) 1.005 - 1.030   Hgb urine dipstick 2+ (A) NEGATIVE   pH 7.0 5.0 - 8.0   Protein, ur NEGATIVE NEGATIVE mg/dL   Nitrite NEGATIVE NEGATIVE   Leukocytes, UA TRACE (A) NEGATIVE   RBC / HPF 0-5 0 - 5 RBC/hpf   WBC, UA 0-5 0 - 5 WBC/hpf   Bacteria, UA NONE SEEN NONE SEEN   Squamous Epithelial / LPF NONE SEEN NONE SEEN   Hyaline Casts, UA PRESENT    Dg Chest 1 View  Result Date: 04/15/2016 CLINICAL DATA:  Acute shortness of breath several days following hysterectomy, initial encounter EXAM: CHEST 1 VIEW COMPARISON:  03/17/2013 FINDINGS: The heart size and mediastinal contours are within normal limits. Both lungs are clear. The visualized skeletal structures are unremarkable. IMPRESSION: No active disease. Electronically Signed   By: Inez Catalina M.D.   On: 04/15/2016 21:09   Ct Angio Chest Pe W And/or Wo Contrast  Result Date: 04/15/2016 CLINICAL DATA:  70 year old female with recent vaginal hysterectomy presenting with acute shortness of breath. EXAM: CT ANGIOGRAPHY CHEST WITH CONTRAST TECHNIQUE: Multidetector CT imaging of the chest was performed using the standard protocol during bolus administration of intravenous contrast. Multiplanar CT image reconstructions and MIPs were obtained to evaluate the vascular anatomy. CONTRAST:  75 cc Isovue 370 COMPARISON:  Chest radiograph dated 04/15/2016 FINDINGS: Minimal bibasilar dependent atelectatic changes of the lungs. The lungs are otherwise clear. There is no pleural effusion or pneumothorax. The central airways are patent. The thoracic aorta appears unremarkable. The origins of the great vessels of the aortic arch appear patent. Check there is no CT evidence of pulmonary embolism. There is no cardiomegaly or pericardial effusion. No hilar or mediastinal adenopathy noted. The esophagus is grossly unremarkable. There is a 1.5 cm right thyroid hypodense nodule.  Ultrasound may provide better evaluation. There is no axillary adenopathy. There is a 3 x 4.3 cm right axillary lipoma. The chest wall soft tissues are otherwise unremarkable. There is mild degenerative changes of the spine. No acute fracture. Small T7 bone island. Multiple hepatic hypodense lesions which are not completely characterized. The largest lesion measures in the left lobe of the liver (segment IV A) and was seen on the CT dated 04/28/2013. Review of the MIP images confirms the above findings. IMPRESSION: No acute intrathoracic pathology. No CT evidence of pulmonary embolism. **An incidental finding of potential clinical significance has been found. A 1.5 cm right thyroid hypodense nodule.** Electronically Signed   By: Anner Crete M.D.   On: 04/15/2016 23:28    Review of Systems  Constitutional:  Negative for chills and fever.  HENT: Negative for sore throat and tinnitus.   Eyes: Negative for blurred vision and redness.  Respiratory: Positive for shortness of breath. Negative for cough.   Cardiovascular: Negative for chest pain, palpitations, orthopnea and PND.  Gastrointestinal: Negative for abdominal pain, diarrhea, nausea and vomiting.  Genitourinary: Negative for dysuria, frequency and urgency.  Musculoskeletal: Negative for joint pain and myalgias.  Skin: Negative for rash.       No lesions  Neurological: Negative for speech change, focal weakness and weakness.  Endo/Heme/Allergies: Does not bruise/bleed easily.       No temperature intolerance  Psychiatric/Behavioral: Negative for depression and suicidal ideas.    Blood pressure 134/64, pulse (!) 110, temperature 97.5 F (36.4 C), temperature source Oral, resp. rate 18, height _0  (1.549 m), weight 55.1 kg (121 lb 8 oz), SpO2 93 %. Physical Exam  Vitals reviewed. Constitutional: She is oriented to person, place, and time. She appears well-developed and well-nourished. No distress.  HENT:  Head: Normocephalic and  atraumatic.  Mouth/Throat: Oropharynx is clear and moist.  Eyes: Conjunctivae and EOM are normal. Pupils are equal, round, and reactive to light. No scleral icterus.  Neck: Normal range of motion. Neck supple. No tracheal deviation present. No thyromegaly present.  Cardiovascular: Normal rate, regular rhythm and normal heart sounds.  Exam reveals no gallop and no friction rub.   No murmur heard. Respiratory: Effort normal and breath sounds normal.  GI: Soft. Bowel sounds are normal. She exhibits no distension. There is no tenderness.  Genitourinary:  Genitourinary Comments: Deferred  Musculoskeletal: Normal range of motion. She exhibits no edema.  Lymphadenopathy:    She has no cervical adenopathy.  Neurological: She is alert and oriented to person, place, and time. No cranial nerve deficit. She exhibits normal muscle tone.  Skin: Skin is warm and dry. No rash noted. No erythema.  Psychiatric: She has a normal mood and affect. Her behavior is normal. Judgment and thought content normal.     Assessment/Plan This is a 70 year old female admitted for persistent tachycardia and shortness of breath. 1. Shortness of breath: Etiology unclear; differential diagnosis includes tachycardia, anxiety, atypical angina. Oxygen saturations are normal on room air. Follow troponin. Cardiology has been consulted. Continue aspirin. 2. Tachycardia: Continue diltiazem 3. Anxiety: Continue Celexa 4. Hypertriglyceridemia: continue fenofibrate 5. DVT prophylaxis: Lovenox 6. GI prophylaxis: Pantoprazole per home regimen The patient is a full code. Time spent on admission orders and patient care approximately 45 minutes  Harrie Foreman, MD 04/16/2016, 5:55 AM

## 2016-04-16 NOTE — Progress Notes (Signed)
[place] [facility] [place] NAME: Vanessa Evans    MR#:  [id number]  DATE OF BIRTH:  08/30/1945  SUBJECTIVE:  CHIEF COMPLAINT:   Chief Complaint  Patient presents with  . Shortness of Breath   - very short of breath, also complaining of chest pressure - stress test tomorrow  REVIEW OF SYSTEMS:  Review of Systems  Constitutional: Negative for chills, fever and malaise/fatigue.  HENT: Negative for ear discharge, ear pain, nosebleeds and tinnitus.   Eyes: Negative for blurred vision.  Respiratory: Positive for shortness of breath. Negative for cough and wheezing.   Cardiovascular: Positive for chest pain. Negative for palpitations and leg swelling.  Gastrointestinal: Negative for abdominal pain, constipation, diarrhea, nausea and vomiting.  Genitourinary: Negative for dysuria and urgency.  Musculoskeletal: Negative for back pain, myalgias and neck pain.  Neurological: Negative for dizziness, speech change, focal weakness, seizures and headaches.    DRUG ALLERGIES:   Allergies  Allergen Reactions  . Codeine Shortness Of Breath and Itching    Bronchospasms and asthma attach  . Ultram [Tramadol] Itching and Other (See Comments)    Bronchospasm and asthma attack  . Nitrofuran Derivatives Diarrhea    Severe and long lasting  . Levaquin [Levofloxacin In D5w] Other (See Comments)    Sensation of being drunk, confused, caused a ruptured tendon in foot  . Ultracet [Tramadol-Acetaminophen] Itching  . Ciprofloxacin Other (See Comments)    Severe pain in neck and down back   . Nsaids Other (See Comments)    Bloody stools, abdominal pain, can tolerate naproxen in low doses.  . Sulfa Antibiotics Itching, Rash and Other (See Comments)    fever    VITALS:  Blood pressure 136/74, pulse (!) 105, temperature 97.5 F (36.4 C), temperature source Oral, resp. rate 17, height 5\' 1"  (1.549 m), weight 55.1 kg (121 lb 8 oz), SpO2 94 %.  PHYSICAL  EXAMINATION:  Physical Exam  GENERAL:  70 y.o.-year-old patient lying in the bed with no acute distress.  EYES: Pupils equal, round, reactive to light and accommodation. No scleral icterus. Extraocular muscles intact.  HEENT: Head atraumatic, normocephalic. Oropharynx and nasopharynx clear.  NECK:  Supple, no jugular venous distention. No thyroid enlargement, no tenderness.  LUNGS: Normal breath sounds bilaterally, no wheezing, rales,rhonchi or crepitation. No use of accessory muscles of respiration.  CARDIOVASCULAR: S1, S2 normal. No murmurs, rubs, or gallops.  ABDOMEN: Soft, nontender, nondistended. Bowel sounds present. No organomegaly or mass.  EXTREMITIES: No pedal edema, cyanosis, or clubbing.  NEUROLOGIC: Cranial nerves II through XII are intact. Muscle strength 5/5 in all extremities. Sensation intact. Gait not checked.  PSYCHIATRIC: The patient is alert and oriented x 3.  SKIN: No obvious rash, lesion, or ulcer.    LABORATORY PANEL:   CBC  Recent Labs Lab 04/15/16 2145  WBC 8.5  HGB 11.7*  HCT 34.0*  PLT 301   ------------------------------------------------------------------------------------------------------------------  Chemistries   Recent Labs Lab 04/15/16 2145  NA 139  K 3.3*  CL 108  CO2 24  GLUCOSE 103*  BUN 16  CREATININE 0.60  CALCIUM 9.4  MG 2.1  AST 22  ALT 21  ALKPHOS 34*  BILITOT 0.7   ------------------------------------------------------------------------------------------------------------------  Cardiac Enzymes  Recent Labs Lab 04/16/16 0612  TROPONINI <0.03   ------------------------------------------------------------------------------------------------------------------  RADIOLOGY:  Dg Chest 1 View  Result Date: 04/15/2016 CLINICAL DATA:  Acute shortness of breath several days following hysterectomy, initial encounter EXAM: CHEST 1 VIEW COMPARISON:  03/17/2013  FINDINGS: The heart size and mediastinal contours are within  normal limits. Both lungs are clear. The visualized skeletal structures are unremarkable. IMPRESSION: No active disease. Electronically Signed   By: Inez Catalina M.D.   On: 04/15/2016 21:09   Ct Angio Chest Pe W And/or Wo Contrast  Result Date: 04/15/2016 CLINICAL DATA:  70 year old female with recent vaginal hysterectomy presenting with acute shortness of breath. EXAM: CT ANGIOGRAPHY CHEST WITH CONTRAST TECHNIQUE: Multidetector CT imaging of the chest was performed using the standard protocol during bolus administration of intravenous contrast. Multiplanar CT image reconstructions and MIPs were obtained to evaluate the vascular anatomy. CONTRAST:  75 cc Isovue 370 COMPARISON:  Chest radiograph dated 04/15/2016 FINDINGS: Minimal bibasilar dependent atelectatic changes of the lungs. The lungs are otherwise clear. There is no pleural effusion or pneumothorax. The central airways are patent. The thoracic aorta appears unremarkable. The origins of the great vessels of the aortic arch appear patent. Check there is no CT evidence of pulmonary embolism. There is no cardiomegaly or pericardial effusion. No hilar or mediastinal adenopathy noted. The esophagus is grossly unremarkable. There is a 1.5 cm right thyroid hypodense nodule. Ultrasound may provide better evaluation. There is no axillary adenopathy. There is a 3 x 4.3 cm right axillary lipoma. The chest wall soft tissues are otherwise unremarkable. There is mild degenerative changes of the spine. No acute fracture. Small T7 bone island. Multiple hepatic hypodense lesions which are not completely characterized. The largest lesion measures in the left lobe of the liver (segment IV A) and was seen on the CT dated 04/28/2013. Review of the MIP images confirms the above findings. IMPRESSION: No acute intrathoracic pathology. No CT evidence of pulmonary embolism. **An incidental finding of potential clinical significance has been found. A 1.5 cm right thyroid hypodense  nodule.** Electronically Signed   By: Anner Crete M.D.   On: 04/15/2016 23:28    EKG:   Orders placed or performed during the hospital encounter of 04/15/16  . ED EKG  . ED EKG  . EKG 12-Lead  . EKG 12-Lead    ASSESSMENT AND PLAN:   70 year old female with past medical history significant for anxiety, arthritis, depression, sinus tachycardia, hypothyroidism presents to the hospital secondary to worsening shortness of breath and chest pain.  #1 angina equivalent symptoms-ray shortness of breath and chest pain. CT angiogram negative for pulmonary embolism. -Appreciate cardiology consult. Echocardiogram ordered. -Troponins and stress test tomorrow a.m. -Continue aspirin  #2 chronic sinus tachycardia-continue home dose of Cardizem. Adjust doses if needed.  #3 anxiety-recently started on low-dose Celexa, patient hasn't started taking it as an outpatient and has refused here.  #4 hypothyroidism-continue Synthroid  #5 GERD-Protonix  #6 DVT prophylaxis-Lovenox    All the records are reviewed and case discussed with Care Management/Social Workerr. Management plans discussed with the patient, family and they are in agreement.  CODE STATUS: Full Code  TOTAL TIME TAKING CARE OF THIS PATIENT: 37 minutes.   POSSIBLE D/C IN 1 DAY, DEPENDING ON CLINICAL CONDITION.   Gladstone Lighter M.D on 04/16/2016 at 12:33 PM  Between 7am to 6pm - Pager - [phone]  After 6pm go to [email or web address] - password [email or web address]  Office  [phone]  CC: Primary care physician; Lavera Guise, MD

## 2016-04-16 NOTE — Consult Note (Signed)
[facility] Consultation Note  Patient ID: Vanessa Evans, MRN: [id number], DOB/AGE: 04-12-1946 70 y.o. Admit date: 04/15/2016   Date of Consult: 04/16/2016 Primary Physician: Lavera Guise, MD Primary Cardiologist: Nehemiah Massed  Chief Complaint:  Chief Complaint  Patient presents with  . Shortness of Breath   Reason for Consult: tachycardia shortness of breath chest pain  HPI: 70 y.o. female with known essential hypertension mixed hyperlipidemia having episodes of tachycardia and palpitation spell previously treated with appropriate diltiazem. The patient has done fairly well until recently she has had certain surgeries. With this surgery she has recovered fairly well but had an episodes of palpitations and tachycardia for which he was very short of breath. When seen in the emergency room she had some supraventricular tachycardia 130 bpm which was treated with appropriate diltiazem and controlled at 110 bpm. Currently telemetry shows normal sinus rhythm with poor R-wave progression. There is no evidence of myocardial infarction with normal troponin. The patient has had improvements of symptoms at this time with continue diltiazem use. There is been no evidence of congestive heart failure at this time and/or progression of chest pain after tachycardia was improved  Past Medical History:  Diagnosis Date  . Anxiety   . Arthritis    osteo  . Asthma    needs rescue inhaler few times a year  . Depression   . Dizziness   . Dysrhythmia    tachycardia  . GERD (gastroesophageal reflux disease)   . Headache(784.0)   . Heart murmur    aortic  . Hyperlipidemia   . Hypertension   . Migraine   . Orthopnea   . Pneumonia    hx  . PONV (postoperative nausea and vomiting)    very anxious during cataract surgery  . Shortness of breath    any exertion, cannot lie flat      Surgical History:  Past Surgical History:  Procedure Laterality Date  . BACK SURGERY    . BREAST SURGERY  Right    lumpectomy  . CATARACT EXTRACTION W/PHACO Left 12/25/2014   Procedure: CATARACT EXTRACTION PHACO AND INTRAOCULAR LENS PLACEMENT (IOC);  Surgeon: Birder Robson, MD;  Location: [facility] ORS;  Service: Ophthalmology;  Laterality: Left;  [place] 00:32 AP% 20.0 CDE 6.49  . COLONOSCOPY W/ BIOPSIES    . CYSTOCELE REPAIR N/A 04/09/2016   Procedure: ANTERIOR REPAIR (CYSTOCELE);  Surgeon: Gae Dry, MD;  Location: [facility] ORS;  Service: Gynecology;  Laterality: N/A;  . DIAGNOSTIC LAPAROSCOPY    . DILATION AND CURETTAGE OF UTERUS    . EYE SURGERY Bilateral   . HERNIA REPAIR Right 2008   Inguinal hernia Repair  . JOINT REPLACEMENT Right    knee  . KNEE ARTHROSCOPY Right   . LUMBAR LAMINECTOMY/DECOMPRESSION MICRODISCECTOMY Right 03/21/2013   Procedure: Right Lumbar five-Sacral one Laminectomy for Synovial Cyst;  Surgeon: Faythe Ghee, MD;  Location: [facility];  Service: Neurosurgery;  Laterality: Right;  right  . OOPHORECTOMY    . UNILATERAL SALPINGECTOMY Left 04/09/2016   Procedure: UNILATERAL SALPINGECTOMY;  Surgeon: Gae Dry, MD;  Location: [facility] ORS;  Service: Gynecology;  Laterality: Left;  Marland Kitchen VAGINAL HYSTERECTOMY N/A 04/09/2016   Procedure: HYSTERECTOMY VAGINAL;  Surgeon: Gae Dry, MD;  Location: [facility] ORS;  Service: Gynecology;  Laterality: N/A;     Home Meds: Prior to Admission medications   Medication Sig Start Date End Date Taking? Authorizing Provider  acetaminophen (TYLENOL) 500 MG tablet Take 1,000 mg by mouth every 6 (  six) hours as needed.   Yes Historical Provider, MD  albuterol (PROVENTIL HFA;VENTOLIN HFA) 108 (90 BASE) MCG/ACT inhaler Inhale 2 puffs into the lungs every 6 (six) hours as needed for wheezing.   Yes Historical Provider, MD  aspirin EC 81 MG tablet Take 81 mg by mouth daily.   Yes Historical Provider, MD  benzonatate (TESSALON) 100 MG capsule Take 100-200 mg by mouth 3 (three) times daily as needed for cough.   Yes Historical Provider, MD  Calcium  Carbonate-Vitamin D (CALCIUM 600 + D PO) Take 1 tablet by mouth 2 (two) times daily.    Yes Historical Provider, MD  cholecalciferol (VITAMIN D) 1000 UNITS tablet Take 1,000 Units by mouth daily.   Yes Historical Provider, MD  cyclobenzaprine (FLEXERIL) 10 MG tablet Take 5 mg by mouth 2 (two) times daily as needed for muscle spasms.    Yes Historical Provider, MD  diclofenac sodium (VOLTAREN) 1 % GEL Apply topically 3 (three) times daily as needed.   Yes Historical Provider, MD  diltiazem (DILACOR XR) 120 MG 24 hr capsule Take 120 mg by mouth at bedtime.    Yes Historical Provider, MD  DiphenhydrAMINE HCl (BENADRYL ALLERGY PO) Take 2 tablets by mouth at bedtime as needed. For allergies   Yes Historical Provider, MD  docusate sodium (COLACE) 100 MG capsule Take 100-300 mg by mouth at bedtime as needed for constipation.   Yes Historical Provider, MD  EPINEPHrine (EPIPEN IJ) Inject as directed.   Yes Historical Provider, MD  estradiol (ESTRACE VAGINAL) 0.1 MG/GM vaginal cream Place 1 Applicatorful vaginally daily.   Yes Historical Provider, MD  fenofibrate 160 MG tablet Take 160 mg by mouth at bedtime.    Yes Historical Provider, MD  folic acid (FOLVITE) A999333 MCG tablet Take 400 mcg by mouth daily.   Yes Historical Provider, MD  HYDROmorphone (DILAUDID) 2 MG tablet Take 1 tablet (2 mg total) by mouth every 4 (four) hours as needed for moderate pain or severe pain. 04/09/16  Yes Gae Dry, MD  ibandronate (BONIVA) 150 MG tablet Take 150 mg by mouth every 30 (thirty) days. Take in the morning with a full glass of water, on an empty stomach, and do not take anything else by mouth or lie down for the next 30 min.   Yes Historical Provider, MD  levocetirizine (XYZAL) 5 MG tablet Take 5 mg by mouth daily as needed.    Yes Historical Provider, MD  Levothyroxine Sodium 75 MCG CAPS Take 75 mcg by mouth daily before breakfast.    Yes Historical Provider, MD  Liniments (BEN GAY EX) Apply 1 application topically  3 (three) times daily.   Yes Historical Provider, MD  Magnesium 250 MG TABS Take 500 mg by mouth daily.    Yes Historical Provider, MD  Melatonin 5 MG TABS Take 1 tablet by mouth at bedtime as needed. For sleep   Yes Historical Provider, MD  Multiple Vitamins-Minerals (MULTIVITAMIN WITH MINERALS) tablet Take 1 tablet by mouth daily.   Yes Historical Provider, MD  pantoprazole (PROTONIX) 40 MG tablet Take 40 mg by mouth as needed.    Yes Historical Provider, MD  UNABLE TO FIND Allergy injections   Yes Historical Provider, MD  vitamin C (ASCORBIC ACID) 500 MG tablet Take 500 mg by mouth daily.   Yes Historical Provider, MD  zinc gluconate 50 MG tablet Take 50 mg by mouth daily.   Yes Historical Provider, MD  citalopram (CELEXA) 10 MG tablet Take 5 mg  by mouth as needed.     Historical Provider, MD    Inpatient Medications:  . aspirin EC  81 mg Oral Daily  . calcium-vitamin D  1 tablet Oral BID  . cholecalciferol  1,000 Units Oral Daily  . citalopram  5 mg Oral Daily  . diltiazem  120 mg Oral QHS  . docusate sodium  100 mg Oral BID  . enoxaparin (LOVENOX) injection  40 mg Subcutaneous Q24H  . estradiol  1 Applicatorful Vaginal Daily  . fenofibrate  160 mg Oral QHS  . folic acid  0.5 mg Oral Daily  . levothyroxine  75 mcg Oral QAC breakfast  . magnesium oxide  400 mg Oral Daily  . Melatonin  1 tablet Oral QHS  . multivitamin with minerals  1 tablet Oral Daily  . pantoprazole  40 mg Oral Daily  . sodium chloride flush  3 mL Intravenous Q12H  . vitamin C  500 mg Oral Daily  . zinc sulfate  220 mg Oral Daily   . sodium chloride 100 mL/hr at 04/16/16 0538    Allergies:  Allergies  Allergen Reactions  . Codeine Shortness Of Breath and Itching    Bronchospasms and asthma attach  . Ultram [Tramadol] Itching and Other (See Comments)    Bronchospasm and asthma attack  . Nitrofuran Derivatives Diarrhea    Severe and long lasting  . Levaquin [Levofloxacin In D5w] Other (See Comments)     Sensation of being drunk, confused, caused a ruptured tendon in foot  . Ultracet [Tramadol-Acetaminophen] Itching  . Ciprofloxacin Other (See Comments)    Severe pain in neck and down back   . Nsaids Other (See Comments)    Bloody stools, abdominal pain, can tolerate naproxen in low doses.  . Sulfa Antibiotics Itching, Rash and Other (See Comments)    fever    Social History   Social History  . Marital status: Married    Spouse name: N/A  . Number of children: N/A  . Years of education: N/A   Occupational History  . Not on file.   Social History Main Topics  . Smoking status: Former Research scientist (life sciences)  . Smokeless tobacco: Never Used  . Alcohol use Yes     Comment: occasional wine  . Drug use: No  . Sexual activity: Not on file   Other Topics Concern  . Not on file   Social History Narrative  . No narrative on file     Family History  Problem Relation Age of Onset  . Pulmonary fibrosis Mother   . Melanoma Father   . Cardiomyopathy Sister     and arrhythmia     Review of Systems Positive for Chest pain shortness of breath Negative for: General:  chills, fever, night sweats or weight changes.  Cardiovascular: PND orthopnea syncope dizziness  Dermatological skin lesions rashes Respiratory: Cough congestion Urologic: Frequent urination urination at night and hematuria Abdominal: negative for nausea, vomiting, diarrhea, bright red blood per rectum, melena, or hematemesis Neurologic: negative for visual changes, and/or hearing changes  All other systems reviewed and are otherwise negative except as noted above.  Labs:  Recent Labs  04/15/16 2145 04/16/16 0612  TROPONINI <0.03 <0.03   Lab Results  Component Value Date   WBC 8.5 04/15/2016   HGB 11.7 (L) 04/15/2016   HCT 34.0 (L) 04/15/2016   MCV 86.2 04/15/2016   PLT 301 04/15/2016    Recent Labs Lab 04/15/16 2145  NA 139  K 3.3*  CL 108  CO2 24  BUN 16  CREATININE 0.60  CALCIUM 9.4  PROT 6.8  BILITOT  0.7  ALKPHOS 34*  ALT 21  AST 22  GLUCOSE 103*   No results found for: CHOL, HDL, LDLCALC, TRIG No results found for: DDIMER  Radiology/Studies:  Dg Chest 1 View  Result Date: 04/15/2016 CLINICAL DATA:  Acute shortness of breath several days following hysterectomy, initial encounter EXAM: CHEST 1 VIEW COMPARISON:  03/17/2013 FINDINGS: The heart size and mediastinal contours are within normal limits. Both lungs are clear. The visualized skeletal structures are unremarkable. IMPRESSION: No active disease. Electronically Signed   By: Inez Catalina M.D.   On: 04/15/2016 21:09   Ct Angio Chest Pe W And/or Wo Contrast  Result Date: 04/15/2016 CLINICAL DATA:  70 year old female with recent vaginal hysterectomy presenting with acute shortness of breath. EXAM: CT ANGIOGRAPHY CHEST WITH CONTRAST TECHNIQUE: Multidetector CT imaging of the chest was performed using the standard protocol during bolus administration of intravenous contrast. Multiplanar CT image reconstructions and MIPs were obtained to evaluate the vascular anatomy. CONTRAST:  75 cc Isovue 370 COMPARISON:  Chest radiograph dated 04/15/2016 FINDINGS: Minimal bibasilar dependent atelectatic changes of the lungs. The lungs are otherwise clear. There is no pleural effusion or pneumothorax. The central airways are patent. The thoracic aorta appears unremarkable. The origins of the great vessels of the aortic arch appear patent. Check there is no CT evidence of pulmonary embolism. There is no cardiomegaly or pericardial effusion. No hilar or mediastinal adenopathy noted. The esophagus is grossly unremarkable. There is a 1.5 cm right thyroid hypodense nodule. Ultrasound may provide better evaluation. There is no axillary adenopathy. There is a 3 x 4.3 cm right axillary lipoma. The chest wall soft tissues are otherwise unremarkable. There is mild degenerative changes of the spine. No acute fracture. Small T7 bone island. Multiple hepatic hypodense lesions  which are not completely characterized. The largest lesion measures in the left lobe of the liver (segment IV A) and was seen on the CT dated 04/28/2013. Review of the MIP images confirms the above findings. IMPRESSION: No acute intrathoracic pathology. No CT evidence of pulmonary embolism. **An incidental finding of potential clinical significance has been found. A 1.5 cm right thyroid hypodense nodule.** Electronically Signed   By: Anner Crete M.D.   On: 04/15/2016 23:28    EKG: Normal sinus rhythm with poor R-wave progression  Weights: Filed Weights   04/15/16 2034 04/16/16 0527  Weight: 126 lb (57.2 kg) 121 lb 8 oz (55.1 kg)     Physical Exam: Blood pressure 136/74, pulse (!) 105, temperature 97.5 F (36.4 C), temperature source Oral, resp. rate 18, height 5\' 1"  (1.549 m), weight 121 lb 8 oz (55.1 kg), SpO2 94 %. Body mass index is 22.96 kg/m. General: Well developed, well nourished, in no acute distress. Head eyes ears nose throat: Normocephalic, atraumatic, sclera non-icteric, no xanthomas, nares are without discharge. No apparent thyromegaly and/or mass  Lungs: Normal respiratory effort.  no wheezes, no rales, no rhonchi.  Heart: RRR with normal S1 S2. no murmur gallop, no rub, PMI is normal size and placement, carotid upstroke normal without bruit, jugular venous pressure is normal Abdomen: Soft, non-tender, non-distended with normoactive bowel sounds. No hepatomegaly. No rebound/guarding. No obvious abdominal masses. Abdominal aorta is normal size without bruit Extremities: No edema. no cyanosis, no clubbing, no ulcers  Peripheral : 2+ bilateral upper extremity pulses, 2+ bilateral femoral pulses, 2+ bilateral dorsal pedal pulse Neuro: Alert and oriented. No facial  asymmetry. No focal deficit. Moves all extremities spontaneously. Musculoskeletal: Normal muscle tone without kyphosis Psych:  Responds to questions appropriately with a normal affect.    Assessment: With known  essential hypertension mixed hyperlipidemia and history of supraventricular tachycardia having supraventricular tachycardia shortness of breath chest pain without current evidence of a myocardial infarction now improved on appropriate medication management  Plan: 1. Continue diltiazem at 120 mg each day 2. Further investigation of other causes including postsurgical concerns and rehabilitation 3. No further current investigation or invasive procedures for chest pain due to no evidence of myocardial infarction 4. Begin ambulation and follow for improvements of symptoms and need for adjustments of medications of treatment of tachycardia 5. If ambulating well possible discharged to home with follow-up in office for further evaluation and treatment options  Signed, Corey Skains M.D. [facility] 04/16/2016, 8:38 AM

## 2016-04-16 NOTE — Progress Notes (Signed)
  Echocardiogram 2D Echocardiogram has been performed.  Jennette Dubin 04/16/2016, 11:20 AM

## 2016-04-16 NOTE — Plan of Care (Signed)
Problem: Activity: Goal: Risk for activity intolerance will decrease Outcome: Progressing Encouraged patient to walk in the room when appropriate. Assisted patient to bathroom. Monitored O2 SATSs.

## 2016-04-16 NOTE — Consult Note (Signed)
[place] provided Advance Directive education. PT stated that she preferred to wait until spouse arrived to discuss and will notify on-call [place] if needed. PT also requested prayer. [place] provided listening presence and pastoral prayer.  Rankin Owens [phone]

## 2016-04-17 ENCOUNTER — Encounter: Payer: Self-pay | Admitting: Radiology

## 2016-04-17 ENCOUNTER — Encounter: Payer: Medicare Other | Attending: Internal Medicine

## 2016-04-17 ENCOUNTER — Observation Stay: Payer: Medicare Other

## 2016-04-17 DIAGNOSIS — R000 Tachycardia, unspecified: Secondary | ICD-10-CM | POA: Diagnosis not present

## 2016-04-17 DIAGNOSIS — R079 Chest pain, unspecified: Secondary | ICD-10-CM | POA: Diagnosis not present

## 2016-04-17 DIAGNOSIS — R0789 Other chest pain: Secondary | ICD-10-CM | POA: Insufficient documentation

## 2016-04-17 DIAGNOSIS — I10 Essential (primary) hypertension: Secondary | ICD-10-CM | POA: Diagnosis not present

## 2016-04-17 DIAGNOSIS — I479 Paroxysmal tachycardia, unspecified: Secondary | ICD-10-CM | POA: Diagnosis not present

## 2016-04-17 DIAGNOSIS — I200 Unstable angina: Secondary | ICD-10-CM | POA: Diagnosis not present

## 2016-04-17 DIAGNOSIS — F419 Anxiety disorder, unspecified: Secondary | ICD-10-CM | POA: Diagnosis not present

## 2016-04-17 DIAGNOSIS — R109 Unspecified abdominal pain: Secondary | ICD-10-CM | POA: Diagnosis not present

## 2016-04-17 DIAGNOSIS — R0602 Shortness of breath: Secondary | ICD-10-CM | POA: Diagnosis not present

## 2016-04-17 DIAGNOSIS — R1030 Lower abdominal pain, unspecified: Secondary | ICD-10-CM | POA: Diagnosis not present

## 2016-04-17 LAB — URINALYSIS COMPLETE WITH MICROSCOPIC (ARMC ONLY)
Bilirubin Urine: NEGATIVE
GLUCOSE, UA: NEGATIVE mg/dL
Ketones, ur: NEGATIVE mg/dL
NITRITE: NEGATIVE
PROTEIN: NEGATIVE mg/dL
SPECIFIC GRAVITY, URINE: 1.002 — AB (ref 1.005–1.030)
pH: 7 (ref 5.0–8.0)

## 2016-04-17 LAB — BASIC METABOLIC PANEL
Anion gap: 9 (ref 5–15)
BUN: 11 mg/dL (ref 6–20)
CALCIUM: 8.7 mg/dL — AB (ref 8.9–10.3)
CO2: 20 mmol/L — AB (ref 22–32)
Chloride: 114 mmol/L — ABNORMAL HIGH (ref 101–111)
Creatinine, Ser: 0.56 mg/dL (ref 0.44–1.00)
GFR calc Af Amer: >60 mL/min (ref >60)
GLUCOSE: 103 mg/dL — AB (ref 65–99)
Potassium: 3.5 mmol/L (ref 3.5–5.1)
Sodium: 143 mmol/L (ref 135–145)

## 2016-04-17 MED ORDER — IOPAMIDOL (ISOVUE-300) INJECTION 61%
100.0000 mL | Freq: Once | INTRAVENOUS | Status: AC | PRN
  Administered 2016-04-17: 100 mL via INTRAVENOUS

## 2016-04-17 MED ORDER — TECHNETIUM TC 99M TETROFOSMIN IV KIT
13.0000 | PACK | Freq: Once | INTRAVENOUS | Status: AC | PRN
Start: 2016-04-17 — End: 2016-04-17
  Administered 2016-04-17: 12.818 via INTRAVENOUS

## 2016-04-17 MED ORDER — REGADENOSON 0.4 MG/5ML IV SOLN
0.4000 mg | Freq: Once | INTRAVENOUS | Status: AC
  Administered 2016-04-17: 0.4 mg via INTRAVENOUS
  Filled 2016-04-17: qty 5

## 2016-04-17 MED ORDER — IOPAMIDOL (ISOVUE-300) INJECTION 61%
30.0000 mL | Freq: Once | INTRAVENOUS | Status: AC | PRN
  Administered 2016-04-17: 30 mL via ORAL
  Filled 2016-04-17: qty 30

## 2016-04-17 MED ORDER — ALUM & MAG HYDROXIDE-SIMETH 200-200-20 MG/5ML PO SUSP
30.0000 mL | ORAL | Status: DC | PRN
  Administered 2016-04-17 – 2016-04-18 (×2): 30 mL via ORAL
  Filled 2016-04-17 (×2): qty 30

## 2016-04-17 MED ORDER — TECHNETIUM TC 99M TETROFOSMIN IV KIT
30.4600 | PACK | Freq: Once | INTRAVENOUS | Status: AC | PRN
  Administered 2016-04-17: 30.46 via INTRAVENOUS

## 2016-04-17 NOTE — Progress Notes (Signed)
Dr. Tressia Miners and Dr. Kenton Kingfisher have spoken to each other. Per Dr. Tressia Miners, order CT abd/pelvis with contrast and cancel discharge. Patient and husband updated,

## 2016-04-17 NOTE — Progress Notes (Signed)
[facility]iology [facility] Encounter Note  Patient: Vanessa Evans / Admit Date: 04/15/2016 / Date of Encounter: 04/17/2016, 1:06 PM   Subjective: Patient has less chest pressure today. No evidence of chest pain. Shortness of breath improved as well. Troponin normal without evidence of myocardial infarction Stress test showing no EKG changes with injection and normal myocardial perfusion without evidence of myocardial ischemia  Review of Systems: Positive for: None Negative for: Vision change, hearing change, syncope, dizziness, nausea, vomiting,diarrhea, bloody stool, stomach pain, cough, congestion, diaphoresis, urinary frequency, urinary pain,skin lesions, skin rashes Others previously listed  Objective: Telemetry: Normal sinus rhythm Physical Exam: Blood pressure 129/73, pulse 81, temperature 98 F (36.7 C), temperature source Oral, resp. rate 18, height 5\' 1"  (1.549 m), weight 121 lb 8 oz (55.1 kg), SpO2 97 %. Body mass index is 22.96 kg/m. General: Well developed, well nourished, in no acute distress. Head: Normocephalic, atraumatic, sclera non-icteric, no xanthomas, nares are without discharge. Neck: No apparent masses Lungs: Normal respirations with no wheezes, no rhonchi, no rales , no crackles   Heart: Regular rate and rhythm, normal S1 S2, no murmur, no rub, no gallop, PMI is normal size and placement, carotid upstroke normal without bruit, jugular venous pressure normal Abdomen: Soft, non-tender, non-distended with normoactive bowel sounds. No hepatosplenomegaly. Abdominal aorta is normal size without bruit Extremities: No edema, no clubbing, no cyanosis, no ulcers,  Peripheral: 2+ radial, 2+ femoral, 2+ dorsal pedal pulses Neuro: Alert and oriented. Moves all extremities spontaneously. Psych:  Responds to questions appropriately with a normal affect.   Intake/Output Summary (Last 24 hours) at 04/17/16 1306 Last data filed at 04/17/16 1244  Gross per 24 hour   Intake             1410 ml  Output             4250 ml  Net            -2840 ml    Inpatient Medications:  . aspirin EC  81 mg Oral Daily  . calcium-vitamin D  1 tablet Oral BID  . cholecalciferol  1,000 Units Oral Daily  . diltiazem  120 mg Oral QHS  . docusate sodium  100 mg Oral BID  . enoxaparin (LOVENOX) injection  40 mg Subcutaneous Q24H  . estradiol  1 Applicatorful Vaginal Daily  . fenofibrate  160 mg Oral QHS  . levothyroxine  75 mcg Oral QAC breakfast  . magnesium oxide  400 mg Oral Daily  . Melatonin  1 tablet Oral QHS  . pantoprazole  40 mg Oral Daily  . sodium chloride flush  3 mL Intravenous Q12H   Infusions:  . sodium chloride 60 mL/hr at 04/17/16 1200    Labs:  Recent Labs  04/15/16 2145 04/17/16 0337  NA 139 143  K 3.3* 3.5  CL 108 114*  CO2 24 20*  GLUCOSE 103* 103*  BUN 16 11  CREATININE 0.60 0.56  CALCIUM 9.4 8.7*  MG 2.1  --     Recent Labs  04/15/16 2145  AST 22  ALT 21  ALKPHOS 34*  BILITOT 0.7  PROT 6.8  ALBUMIN 4.2    Recent Labs  04/15/16 2145  WBC 8.5  NEUTROABS 6.0  HGB 11.7*  HCT 34.0*  MCV 86.2  PLT 301    Recent Labs  04/15/16 2145 04/16/16 0612 04/16/16 1219 04/16/16 1808  TROPONINI <0.03 <0.03 <0.03 <0.03   Invalid input(s): POCBNP  Recent Labs  04/16/16 0612  HGBA1C 5.5     Weights: [facility] Weights   04/15/16 2034 04/16/16 0527  Weight: 126 lb (57.2 kg) 121 lb 8 oz (55.1 kg)     Radiology/Studies:  Dg Chest 1 View  Result Date: 04/15/2016 CLINICAL DATA:  Acute shortness of breath several days following hysterectomy, initial encounter EXAM: CHEST 1 VIEW COMPARISON:  03/17/2013 FINDINGS: The heart size and mediastinal contours are within normal limits. Both lungs are clear. The visualized skeletal structures are unremarkable. IMPRESSION: No active disease. Electronically Signed   By: Inez Catalina M.D.   On: 04/15/2016 21:09   Ct Angio Chest Pe W And/or Wo Contrast  Result Date:  04/15/2016 CLINICAL DATA:  70 year old female with recent vaginal hysterectomy presenting with acute shortness of breath. EXAM: CT ANGIOGRAPHY CHEST WITH CONTRAST TECHNIQUE: Multidetector CT imaging of the chest was performed using the standard protocol during bolus administration of intravenous contrast. Multiplanar CT image reconstructions and MIPs were obtained to evaluate the vascular anatomy. CONTRAST:  75 cc Isovue 370 COMPARISON:  Chest radiograph dated 04/15/2016 FINDINGS: Minimal bibasilar dependent atelectatic changes of the lungs. The lungs are otherwise clear. There is no pleural effusion or pneumothorax. The central airways are patent. The thoracic aorta appears unremarkable. The origins of the great vessels of the aortic arch appear patent. Check there is no CT evidence of pulmonary embolism. There is no cardiomegaly or pericardial effusion. No hilar or mediastinal adenopathy noted. The esophagus is grossly unremarkable. There is a 1.5 cm right thyroid hypodense nodule. Ultrasound may provide better evaluation. There is no axillary adenopathy. There is a 3 x 4.3 cm right axillary lipoma. The chest wall soft tissues are otherwise unremarkable. There is mild degenerative changes of the spine. No acute fracture. Small T7 bone island. Multiple hepatic hypodense lesions which are not completely characterized. The largest lesion measures in the left lobe of the liver (segment IV A) and was seen on the CT dated 04/28/2013. Review of the MIP images confirms the above findings. IMPRESSION: No acute intrathoracic pathology. No CT evidence of pulmonary embolism. **An incidental finding of potential clinical significance has been found. A 1.5 cm right thyroid hypodense nodule.** Electronically Signed   By: Anner Crete M.D.   On: 04/15/2016 23:28     Assessment and Recommendation  70 y.o. female with known previous history of supraventricular tachycardia essential hypertension mixed hyperlipidemia with  chest pressure or having an episode of supraventricular tachycardia now stable on appropriate medication management without evidence of myocardial infarction with normal myocardial perfusion by stress test 1. Continue medication management for the supraventricular tachycardia without change today 2. Begin ambulation and follow for any further significant symptoms 3. No further cardiac intervention at this time with normal stress test and no evidence of myocardial infarction 4. Okay for discharge to home from cardiac standpoint with follow-up in 4 weeks  Signed, Serafina Royals M.D. FACC

## 2016-04-17 NOTE — Progress Notes (Signed)
Dr. Tressia Miners notified that patient does not want to leave until she is seen by Dr. Kenton Kingfisher - order to consult OBGYN. Spoke with Dr. Kenton Kingfisher, he will be around to see patient this evening. Dr. Tressia Miners aware, will wait on d/c until he sees the patient.

## 2016-04-17 NOTE — Consult Note (Signed)
Obstetrics & Gynecology Consultation Note  Date of Consultation: 04/17/2016   Requesting Provider: [facility] Dr Tressia Miners  Primary OBGYN: [facility] Primary Care Provider: Lavera Guise  Reason for Consultation: Lower abdominal pain  History of Present Illness: [name] is a 70 y.o. with the above CC. Pt had TVH and Anterior Repair for pelvic organ prolapse and cystocele 8 days ago; returned same day with post op urinary retention and admitted overnight; next day successful with voiding trial and resolution of hyponatremia; returned 2 days ago with SOB and tachycardia and has been seen/ evaluated/ ruled out for PE, MI, and other etiology for this concern; today c/o lower abdominal pain that she describes as severe.  She reports normal void, although has to go frequently but this is not new to her even before surgery, and normal BM's.  No vaginal bleeding or pain.  No nausea or diarrhea.  Mild low back pain that also is chronic.  Pt has tolerated reg diet and has been ambulating today without difficulty.  ROS: A 12-point review of systems was performed and negative, except as stated in the above HPI.   Past Medical History: Past Medical History:  Diagnosis Date  . Anxiety   . Arthritis    osteo  . Asthma    needs rescue inhaler few times a year  . Depression   . Dizziness   . Dysrhythmia    tachycardia  . GERD (gastroesophageal reflux disease)   . Headache(784.0)   . Heart murmur    aortic  . Hyperlipidemia   . Hypertension   . Migraine   . Orthopnea   . Pneumonia    hx  . PONV (postoperative nausea and vomiting)    very anxious during cataract surgery  . Shortness of breath    any exertion, cannot lie flat    Past Surgical History: Past Surgical History:  Procedure Laterality Date  . BACK SURGERY    . BREAST SURGERY Right    lumpectomy  . CATARACT EXTRACTION W/PHACO Left 12/25/2014   Procedure: CATARACT EXTRACTION PHACO AND INTRAOCULAR LENS PLACEMENT (IOC);   Surgeon: Birder Robson, MD;  Location: [facility] ORS;  Service: Ophthalmology;  Laterality: Left;  [place] 00:32 AP% 20.0 CDE 6.49  . COLONOSCOPY W/ BIOPSIES    . CYSTOCELE REPAIR N/A 04/09/2016   Procedure: ANTERIOR REPAIR (CYSTOCELE);  Surgeon: Gae Dry, MD;  Location: [facility] ORS;  Service: Gynecology;  Laterality: N/A;  . DIAGNOSTIC LAPAROSCOPY    . DILATION AND CURETTAGE OF UTERUS    . EYE SURGERY Bilateral   . HERNIA REPAIR Right 2008   Inguinal hernia Repair  . JOINT REPLACEMENT Right    knee  . KNEE ARTHROSCOPY Right   . LUMBAR LAMINECTOMY/DECOMPRESSION MICRODISCECTOMY Right 03/21/2013   Procedure: Right Lumbar five-Sacral one Laminectomy for Synovial Cyst;  Surgeon: Faythe Ghee, MD;  Location: [facility];  Service: Neurosurgery;  Laterality: Right;  right  . OOPHORECTOMY    . UNILATERAL SALPINGECTOMY Left 04/09/2016   Procedure: UNILATERAL SALPINGECTOMY;  Surgeon: Gae Dry, MD;  Location: [facility] ORS;  Service: Gynecology;  Laterality: Left;  Marland Kitchen VAGINAL HYSTERECTOMY N/A 04/09/2016   Procedure: HYSTERECTOMY VAGINAL;  Surgeon: Gae Dry, MD;  Location: [facility] ORS;  Service: Gynecology;  Laterality: N/A;    Family History:  Family History  Problem Relation Age of Onset  . Pulmonary fibrosis Mother   . Melanoma Father   . Cardiomyopathy Sister     and arrhythmia   She denies  any female cancers, bleeding or blood clotting disorders.   Social History:  Social History   Social History  . Marital status: Married    Spouse name: N/A  . Number of children: N/A  . Years of education: N/A   Occupational History  . Not on file.   Social History Main Topics  . Smoking status: Former Research scientist (life sciences)  . Smokeless tobacco: Never Used  . Alcohol use Yes     Comment: occasional wine  . Drug use: No  . Sexual activity: Not on file   Other Topics Concern  . Not on file   Social History Narrative  . No narrative on file   Allergy: Allergies  Allergen Reactions  . Codeine  Shortness Of Breath and Itching    Bronchospasms and asthma attach  . Ultram [Tramadol] Itching and Other (See Comments)    Bronchospasm and asthma attack  . Nitrofuran Derivatives Diarrhea    Severe and long lasting  . Levaquin [Levofloxacin In D5w] Other (See Comments)    Sensation of being drunk, confused, caused a ruptured tendon in foot  . Ultracet [Tramadol-Acetaminophen] Itching  . Ciprofloxacin Other (See Comments)    Severe pain in neck and down back   . Nsaids Other (See Comments)    Bloody stools, abdominal pain, can tolerate naproxen in low doses.  . Sulfa Antibiotics Itching, Rash and Other (See Comments)    fever    Current Outpatient Medications: Prescriptions Prior to Admission  Medication Sig Dispense Refill Last Dose  . acetaminophen (TYLENOL) 500 MG tablet Take 1,000 mg by mouth every 6 (six) hours as needed.   prn  . albuterol (PROVENTIL HFA;VENTOLIN HFA) 108 (90 BASE) MCG/ACT inhaler Inhale 2 puffs into the lungs every 6 (six) hours as needed for wheezing.   prn  . aspirin EC 81 MG tablet Take 81 mg by mouth daily.   04/15/2016 at Unknown time  . benzonatate (TESSALON) 100 MG capsule Take 100-200 mg by mouth 3 (three) times daily as needed for cough.   prn  . Calcium Carbonate-Vitamin D (CALCIUM 600 + D PO) Take 1 tablet by mouth 2 (two) times daily.    04/15/2016 at Unknown time  . cholecalciferol (VITAMIN D) 1000 UNITS tablet Take 1,000 Units by mouth daily.   04/15/2016 at Unknown time  . cyclobenzaprine (FLEXERIL) 10 MG tablet Take 5 mg by mouth 2 (two) times daily as needed for muscle spasms.    04/15/2016 at Unknown time  . diclofenac sodium (VOLTAREN) 1 % GEL Apply topically 3 (three) times daily as needed.   04/15/2016 at Unknown time  . diltiazem (DILACOR XR) 120 MG 24 hr capsule Take 120 mg by mouth at bedtime.    04/15/2016 at Unknown time  . DiphenhydrAMINE HCl (BENADRYL ALLERGY PO) Take 2 tablets by mouth at bedtime as needed. For allergies   prn  . docusate  sodium (COLACE) 100 MG capsule Take 100-300 mg by mouth at bedtime as needed for constipation.   prn  . EPINEPHrine (EPIPEN IJ) Inject as directed.   prn  . estradiol (ESTRACE VAGINAL) 0.1 MG/GM vaginal cream at bedtime. Apply a pea-sized amount to urethra at bedtime   04/15/2016 at Unknown time  . fenofibrate 160 MG tablet Take 160 mg by mouth at bedtime.    04/15/2016 at Unknown time  . folic acid (FOLVITE) A999333 MCG tablet Take 400 mcg by mouth daily.   04/15/2016 at Unknown time  . HYDROmorphone (DILAUDID) 2 MG tablet Take 1  tablet (2 mg total) by mouth every 4 (four) hours as needed for moderate pain or severe pain. 40 tablet 0 Past Week at Unknown time  . ibandronate (BONIVA) 150 MG tablet Take 150 mg by mouth every 30 (thirty) days. Take in the morning with a full glass of water, on an empty stomach, and do not take anything else by mouth or lie down for the next 30 min.   Past Month at Unknown time  . levocetirizine (XYZAL) 5 MG tablet Take 5 mg by mouth daily as needed.    04/15/2016 at Unknown time  . Levothyroxine Sodium 75 MCG CAPS Take 75 mcg by mouth daily before breakfast.    04/15/2016 at Unknown time  . Liniments (BEN GAY EX) Apply 1 application topically 3 (three) times daily.   prn  . Magnesium 250 MG TABS Take 500 mg by mouth daily.    04/15/2016 at Unknown time  . Melatonin 5 MG TABS Take 1 tablet by mouth at bedtime as needed. For sleep   prn  . Multiple Vitamins-Minerals (MULTIVITAMIN WITH MINERALS) tablet Take 1 tablet by mouth daily.   04/15/2016 at Unknown time  . pantoprazole (PROTONIX) 40 MG tablet Take 40 mg by mouth as needed.    04/15/2016 at Unknown time  . UNABLE TO FIND Allergy injections   Past Month at Unknown time  . vitamin C (ASCORBIC ACID) 500 MG tablet Take 500 mg by mouth daily.   04/15/2016 at Unknown time  . zinc gluconate 50 MG tablet Take 50 mg by mouth daily.   04/15/2016 at Unknown time  . citalopram (CELEXA) 10 MG tablet Take 5 mg by mouth as needed.    Not Taking at  Unknown time     Hospital Medications: Current Facility-Administered Medications  Medication Dose Route Frequency Provider Last Rate Last Dose  . 0.9 %  sodium chloride infusion   Intravenous Continuous Gladstone Lighter, MD 60 mL/hr at 04/17/16 1200    . acetaminophen (TYLENOL) tablet 650 mg  650 mg Oral Q6H PRN Harrie Foreman, MD   325 mg at 04/17/16 1021   Or  . acetaminophen (TYLENOL) suppository 650 mg  650 mg Rectal Q6H PRN Harrie Foreman, MD      . albuterol (PROVENTIL) (2.5 MG/3ML) 0.083% nebulizer solution 3 mL  3 mL Inhalation Q6H PRN Harrie Foreman, MD   3 mL at 04/16/16 1446  . aspirin EC tablet 81 mg  81 mg Oral Daily Harrie Foreman, MD   81 mg at 04/17/16 1011  . benzonatate (TESSALON) capsule 100-200 mg  100-200 mg Oral TID PRN Harrie Foreman, MD   200 mg at 04/17/16 1300  . calcium-vitamin D (OSCAL WITH D) 500-200 MG-UNIT per tablet 1 tablet  1 tablet Oral BID Harrie Foreman, MD      . cetirizine (ZYRTEC) tablet 10 mg  10 mg Oral Daily PRN Harrie Foreman, MD   10 mg at 04/17/16 1303  . cholecalciferol (VITAMIN D) tablet 1,000 Units  1,000 Units Oral Daily Harrie Foreman, MD      . cyclobenzaprine (FLEXERIL) tablet 5 mg  5 mg Oral BID PRN Harrie Foreman, MD      . diltiazem [facility] CD) 24 hr capsule 120 mg  120 mg Oral QHS Harrie Foreman, MD   120 mg at 04/16/16 2200  . diphenhydrAMINE (BENADRYL) capsule 25 mg  25 mg Oral QHS PRN Harrie Foreman, MD      .  docusate sodium (COLACE) capsule 100 mg  100 mg Oral BID Harrie Foreman, MD   100 mg at 04/17/16 1013  . enoxaparin (LOVENOX) injection 40 mg  40 mg Subcutaneous Q24H Harrie Foreman, MD   40 mg at 04/16/16 2303  . estradiol (ESTRACE) vaginal cream 1 Applicatorful  1 Applicatorful Vaginal Daily Harrie Foreman, MD      . fenofibrate tablet 160 mg  160 mg Oral QHS Harrie Foreman, MD   160 mg at 04/16/16 2303  . HYDROcodone-acetaminophen (NORCO/VICODIN) 5-325 MG per tablet 1-2 tablet   1-2 tablet Oral Q4H PRN Harrie Foreman, MD      . HYDROmorphone (DILAUDID) tablet 1 mg  1 mg Oral Q4H PRN Harrie Foreman, MD      . levothyroxine (SYNTHROID, LEVOTHROID) tablet 75 mcg  75 mcg Oral QAC breakfast Harrie Foreman, MD   75 mcg at 04/17/16 1013  . magnesium oxide (MAG-OX) tablet 400 mg  400 mg Oral Daily Harrie Foreman, MD      . Melatonin TABS 5 mg  1 tablet Oral QHS Harrie Foreman, MD   5 mg at 04/16/16 2303  . ondansetron (ZOFRAN) tablet 4 mg  4 mg Oral Q6H PRN Harrie Foreman, MD       Or  . ondansetron [facility]) injection 4 mg  4 mg Intravenous Q6H PRN Harrie Foreman, MD      . pantoprazole (PROTONIX) EC tablet 40 mg  40 mg Oral Daily Harrie Foreman, MD   40 mg at 04/17/16 1014  . polyethylene glycol (MIRALAX / GLYCOLAX) packet 17 g  17 g Oral Daily PRN Harrie Foreman, MD      . sodium chloride flush (NS) 0.9 % injection 3 mL  3 mL Intravenous Q12H Harrie Foreman, MD   3 mL at 04/17/16 1015     Physical Exam: Vitals:   04/16/16 1950 04/17/16 0646 04/17/16 1310 04/17/16 1446  BP: 133/71 129/73 117/71 135/63  Pulse: (!) 103 81 75 79  Resp: 16 18 18    Temp: 98.9 F (37.2 C) 98 F (36.7 C) 97.5 F (36.4 C) 97.6 F (36.4 C)  TempSrc: Oral Oral Axillary Oral  SpO2: 100% 97% 99% 98%  Weight:      Height:        Temp:  [97.5 F (36.4 C)-98.9 F (37.2 C)] 97.6 F (36.4 C) (09/08 1446) Pulse Rate:  [75-103] 79 (09/08 1446) Resp:  [16-18] 18 (09/08 1310) BP: (117-135)/(63-73) 135/63 (09/08 1446) SpO2:  [97 %-100 %] 98 % (09/08 1446) I/O last 3 completed shifts: In: 2926.7 [P.O.:480; I.V.:1946.7; IV Piggyback:500] Out: 5825 [Urine:5825] Total I/O In: -  Out: 1000 [Urine:1000]  Intake/Output Summary (Last 24 hours) at 04/17/16 1754 Last data filed at 04/17/16 1503  Gross per 24 hour  Intake              726 ml  Output             3350 ml  Net            -2624 ml     Current Vital Signs 24h Vital Sign Ranges  T 97.6 F (36.4 C)  Temp  Avg: 98 F (36.7 C)  Min: 97.5 F (36.4 C)  Max: 98.9 F (37.2 C)  BP 135/63 BP  Min: 117/71  Max: 135/63  HR 79 Pulse  Avg: 84.5  Min: 75  Max: 103  RR 18 Resp  Avg: 17.3  Min: 16  Max: 18  SaO2 98 % Not Delivered SpO2  Avg: 98.5 %  Min: 97 %  Max: 100 %       24 Hour I/O Current Shift I/O  Time Ins Outs 09/07 0701 - 09/08 0700 In: 1946.7 [I.V.:1946.7] Out: [id number] 09/08 0701 - 09/08 1900 In: -  Out: 1000 [Urine:1000]   Patient Vitals for the past 8 hrs:  BP Temp Temp src Pulse Resp SpO2  04/17/16 1446 135/63 97.6 F (36.4 C) Oral 79 - 98 %  04/17/16 1310 117/71 97.5 F (36.4 C) Axillary 75 18 99 %    Body mass index is 22.96 kg/m. General appearance: Well nourished, well developed female in no acute distress.  Neck:  Supple, normal appearance, and no thyromegaly  Cardiovascular:Regular rate and rhythm.  No murmurs, rubs or gallops. Respiratory:  Clear to auscultation bilateral. Normal respiratory effort Abdomen: positive bowel sounds and no masses, hernias; diffusely mildly tender to deep palpation, non distended, no rebound or guarding Neuro/Psych:  Normal mood and affect.  Skin:  Warm and dry.  Lymphatic:  No inguinal lymphadenopathy.   Recent Labs Lab 04/15/16 2145  WBC 8.5  HGB 11.7*  HCT 34.0*  PLT 301    Recent Labs Lab 04/15/16 2145 04/17/16 0337  NA 139 143  K 3.3* 3.5  CL 108 114*  CO2 24 20*  BUN 16 11  CREATININE 0.60 0.56  CALCIUM 9.4 8.7*  PROT 6.8  --   BILITOT 0.7  --   ALKPHOS 34*  --   ALT 21  --   AST 22  --   GLUCOSE 103* 103*   Imaging:  CT and Nuclear Medicine Studies of chest normal  Assessment: Vanessa Evans is a 70 y.o. with new onset lower abdominal pain today, s/p TVH AR 8 days ago, initial urinary retention yet normal voiding now, uncertain etiology to her pain but may be related to her post op healing course.  Unlikely renal, bowel, ureter, or vaginal complication based on exam and labs and clinical  course.  Exam not indicative of an acute abdomen.  Plan: Will plan CT of Abd and pelvis (w and w/out contrast is usual study utilized for uncertain pain etiology).  Pt strongly endorses looking for cause/ reassurance as to why she started hurting today.   She has otherwise been cleared for discharge, and if CT is negative and as I have no other suspicions for urgent concerns other than surgery healing, it should be OK for discharge if all is found normal.  Barnett Applebaum, MD [place] Pager [phone]

## 2016-04-17 NOTE — Discharge Summary (Signed)
[place] [facility] [place] NAME: Vanessa Evans    MR#:  [id number]  DATE OF BIRTH:  08/25/1945  DATE OF ADMISSION:  04/15/2016   ADMITTING PHYSICIAN: Harrie Foreman, MD  DATE OF DISCHARGE:  04/17/2016  PRIMARY CARE PHYSICIAN: Lavera Guise, MD   ADMISSION DIAGNOSIS:   Tachycardia [R00.0] Acute dyspnea [R06.00] Chest pain, unspecified chest pain type [R07.9]  DISCHARGE DIAGNOSIS:   Active Problems:   SOB (shortness of breath)   SECONDARY DIAGNOSIS:   Past Medical History:  Diagnosis Date  . Anxiety   . Arthritis    osteo  . Asthma    needs rescue inhaler few times a year  . Depression   . Dizziness   . Dysrhythmia    tachycardia  . GERD (gastroesophageal reflux disease)   . Headache(784.0)   . Heart murmur    aortic  . Hyperlipidemia   . Hypertension   . Migraine   . Orthopnea   . Pneumonia    hx  . PONV (postoperative nausea and vomiting)    very anxious during cataract surgery  . Shortness of breath    any exertion, cannot lie flat    HOSPITAL COURSE:    70 year old female with past medical history significant for anxiety, arthritis, depression, sinus tachycardia, hypothyroidism presents to the hospital secondary to worsening shortness of breath and chest pain.  #1 Dyspnea on admission- likely from anxiety -CT angiogram negative for pulmonary embolism. -Appreciate cardiology consult. Echocardiogram normal. -Troponins negative and myoview normal  #2 chronic sinus tachycardia-h/o SVT. continue home dose of Cardizem.  Rate is better controlled today.  #3 anxiety-recently started on low-dose Celexa, patient hasn't started taking it as an outpatient and has refused here.continue taking it, has a lot of anxiety  #4 hypothyroidism-continue Synthroid  #5 GERD-Protonix   Discharge today  DISCHARGE CONDITIONS:   Stable  CONSULTS OBTAINED:   Treatment Team:  Corey Skains, MD  DRUG  ALLERGIES:   Allergies  Allergen Reactions  . Codeine Shortness Of Breath and Itching    Bronchospasms and asthma attach  . Ultram [Tramadol] Itching and Other (See Comments)    Bronchospasm and asthma attack  . Nitrofuran Derivatives Diarrhea    Severe and long lasting  . Levaquin [Levofloxacin In D5w] Other (See Comments)    Sensation of being drunk, confused, caused a ruptured tendon in foot  . Ultracet [Tramadol-Acetaminophen] Itching  . Ciprofloxacin Other (See Comments)    Severe pain in neck and down back   . Nsaids Other (See Comments)    Bloody stools, abdominal pain, can tolerate naproxen in low doses.  . Sulfa Antibiotics Itching, Rash and Other (See Comments)    fever   DISCHARGE MEDICATIONS:     Medication List    STOP taking these medications   UNABLE TO FIND     TAKE these medications   acetaminophen 500 MG tablet Commonly known as:  TYLENOL Take 1,000 mg by mouth every 6 (six) hours as needed.   albuterol 108 (90 Base) MCG/ACT inhaler Commonly known as:  PROVENTIL HFA;VENTOLIN HFA Inhale 2 puffs into the lungs every 6 (six) hours as needed for wheezing.   aspirin EC 81 MG tablet Take 81 mg by mouth daily.   BEN GAY EX Apply 1 application topically 3 (three) times daily.   BENADRYL ALLERGY PO Take 2 tablets by mouth at bedtime as needed. For allergies   benzonatate 100 MG capsule Commonly known as:  TESSALON Take 100-200 mg by mouth 3 (three) times daily as needed for cough.   BONIVA 150 MG tablet Generic drug:  ibandronate Take 150 mg by mouth every 30 (thirty) days. Take in the morning with a full glass of water, on an empty stomach, and do not take anything else by mouth or lie down for the next 30 min.   CALCIUM 600 + D PO Take 1 tablet by mouth 2 (two) times daily.   cholecalciferol 1000 units tablet Commonly known as:  VITAMIN D Take 1,000 Units by mouth daily.   citalopram 10 MG tablet Commonly known as:  CELEXA Take 5 mg by mouth  as needed.   cyclobenzaprine 10 MG tablet Commonly known as:  FLEXERIL Take 5 mg by mouth 2 (two) times daily as needed for muscle spasms.   diclofenac sodium 1 % Gel Commonly known as:  VOLTAREN Apply topically 3 (three) times daily as needed.   diltiazem 120 MG 24 hr capsule Commonly known as:  DILACOR XR Take 120 mg by mouth at bedtime.   docusate sodium 100 MG capsule Commonly known as:  COLACE Take 100-300 mg by mouth at bedtime as needed for constipation.   EPIPEN IJ Inject as directed.   ESTRACE VAGINAL 0.1 MG/GM vaginal cream Generic drug:  estradiol at bedtime. Apply a pea-sized amount to urethra at bedtime   fenofibrate 160 MG tablet Take 160 mg by mouth at bedtime.   folic acid A999333 MCG tablet Commonly known as:  FOLVITE Take 400 mcg by mouth daily.   HYDROmorphone 2 MG tablet Commonly known as:  DILAUDID Take 1 tablet (2 mg total) by mouth every 4 (four) hours as needed for moderate pain or severe pain.   levocetirizine 5 MG tablet Commonly known as:  XYZAL Take 5 mg by mouth daily as needed.   Levothyroxine Sodium 75 MCG Caps Take 75 mcg by mouth daily before breakfast.   Magnesium 250 MG Tabs Take 500 mg by mouth daily.   Melatonin 5 MG Tabs Take 1 tablet by mouth at bedtime as needed. For sleep   multivitamin with minerals tablet Take 1 tablet by mouth daily.   pantoprazole 40 MG tablet Commonly known as:  PROTONIX Take 40 mg by mouth as needed.   vitamin C 500 MG tablet Commonly known as:  ASCORBIC ACID Take 500 mg by mouth daily.   zinc gluconate 50 MG tablet Take 50 mg by mouth daily.        DISCHARGE INSTRUCTIONS:   1. Obgyn follow up in 1 week 2. PCP f/u in 1-2 weeks  DIET:   Low sodium diet  ACTIVITY:   Activity as tolerated  OXYGEN:   Home Oxygen: No.  Oxygen Delivery: room air  DISCHARGE LOCATION:   home   If you experience worsening of your admission symptoms, develop shortness of breath, life threatening  emergency, suicidal or homicidal thoughts you must seek medical attention immediately by calling 911 or calling your MD immediately  if symptoms less severe.  You Must read complete instructions/literature along with all the possible adverse reactions/side effects for all the Medicines you take and that have been prescribed to you. Take any new Medicines after you have completely understood and accpet all the possible adverse reactions/side effects.   Please note  You were cared for by a hospitalist during your hospital stay. If you have any questions about your discharge medications or the care you received while you were in the hospital after you  are discharged, you can call the unit and asked to speak with the hospitalist on call if the hospitalist that took care of you is not available. Once you are discharged, your primary care physician will handle any further medical issues. Please note that NO REFILLS for any discharge medications will be authorized once you are discharged, as it is imperative that you return to your primary care physician (or establish a relationship with a primary care physician if you do not have one) for your aftercare needs so that they can reassess your need for medications and monitor your lab values.    On the day of Discharge:  VITAL SIGNS:   Blood pressure 117/71, pulse 75, temperature 97.5 F (36.4 C), temperature source Axillary, resp. rate 18, height 5\' 1"  (1.549 m), weight 55.1 kg (121 lb 8 oz), SpO2 99 %.  PHYSICAL EXAMINATION:    GENERAL:  70 y.o.-year-old patient lying in the bed with no acute distress.  EYES: Pupils equal, round, reactive to light and accommodation. No scleral icterus. Extraocular muscles intact.  HEENT: Head atraumatic, normocephalic. Oropharynx and nasopharynx clear.  NECK:  Supple, no jugular venous distention. No thyroid enlargement, no tenderness.  LUNGS: Normal breath sounds bilaterally, no wheezing, rales,rhonchi or  crepitation. No use of accessory muscles of respiration.  CARDIOVASCULAR: S1, S2 normal. No murmurs, rubs, or gallops.  ABDOMEN: Soft, nontender, nondistended. Bowel sounds present. No organomegaly or mass.  EXTREMITIES: No pedal edema, cyanosis, or clubbing.  NEUROLOGIC: Cranial nerves II through XII are intact. Muscle strength 5/5 in all extremities. Sensation intact. Gait not checked.  PSYCHIATRIC: The patient is alert and oriented x 3.  SKIN: No obvious rash, lesion, or ulcer.   DATA REVIEW:   CBC  Recent Labs Lab 04/15/16 2145  WBC 8.5  HGB 11.7*  HCT 34.0*  PLT 301    Chemistries   Recent Labs Lab 04/15/16 2145 04/17/16 0337  NA 139 143  K 3.3* 3.5  CL 108 114*  CO2 24 20*  GLUCOSE 103* 103*  BUN 16 11  CREATININE 0.60 0.56  CALCIUM 9.4 8.7*  MG 2.1  --   AST 22  --   ALT 21  --   ALKPHOS 34*  --   BILITOT 0.7  --      Microbiology Results  Results for orders placed or performed during the hospital encounter of 04/03/16  Surgical pcr screen     Status: None   Collection Time: 04/03/16  1:04 PM  Result Value Ref Range Status   MRSA, PCR NEGATIVE NEGATIVE Final   Staphylococcus aureus NEGATIVE NEGATIVE Final    Comment:        The Xpert SA Assay (FDA approved for NASAL specimens in patients over 68 years of age), is one component of a comprehensive surveillance program.  Test performance has been validated by [facility] for patients greater than or equal to 75 year old. It is not intended to diagnose infection nor to guide or monitor treatment.     RADIOLOGY:  No results found.   Management plans discussed with the patient, family and they are in agreement.  CODE STATUS:     Code Status Orders        Start     Ordered   04/16/16 0454  Full code  Continuous     04/16/16 0453    Code Status History    Date Active Date Inactive Code Status Order ID Comments User Context   04/16/2016  4:54  AM 04/16/2016  7:49 AM Full Code [id number]   Harrie Foreman, MD ED   04/10/2016 12:04 AM 04/10/2016  5:07 PM Full Code [id number]  Gae Dry, MD Inpatient      TOTAL TIME TAKING CARE OF THIS PATIENT: 37 minutes.    Gladstone Lighter M.D on 04/17/2016 at 2:37 PM  Between 7am to 6pm - Pager - [phone]  After 6pm go to [email or web address] - Proofreader  [facility]  Office  [phone]  CC: Primary care physician; Lavera Guise, MD   Note: This dictation was prepared with Dragon dictation along with smaller phrase technology. Any transcriptional errors that result from this process are unintentional.

## 2016-04-18 DIAGNOSIS — I10 Essential (primary) hypertension: Secondary | ICD-10-CM | POA: Diagnosis not present

## 2016-04-18 DIAGNOSIS — F419 Anxiety disorder, unspecified: Secondary | ICD-10-CM | POA: Diagnosis not present

## 2016-04-18 DIAGNOSIS — R0602 Shortness of breath: Secondary | ICD-10-CM | POA: Diagnosis not present

## 2016-04-18 DIAGNOSIS — R000 Tachycardia, unspecified: Secondary | ICD-10-CM | POA: Diagnosis not present

## 2016-04-18 DIAGNOSIS — R109 Unspecified abdominal pain: Secondary | ICD-10-CM | POA: Diagnosis not present

## 2016-04-18 MED ORDER — SIMETHICONE 80 MG PO CHEW: 160.0000 mg | CHEWABLE_TABLET | Freq: Three times a day (TID) | ORAL | Status: DC

## 2016-04-18 MED ORDER — HYDROCODONE-ACETAMINOPHEN 5-325 MG PO TABS: 1.0000 | ORAL_TABLET | Freq: Four times a day (QID) | ORAL | 0 refills | Status: DC | PRN

## 2016-04-18 NOTE — Discharge Instructions (Signed)

## 2016-04-18 NOTE — Progress Notes (Signed)
Arrival Method: via stretcher with EDtech Mental Orientation: A&O Telemetry: [id number], verified by Jerl Mina, NT Skin: intact, just scattered bruising bilateral arms, just had vaginal hysterectomy 8/31, verified by [place], RN IV: 20g right AC, 20g right lower forearm Pain: none Tubes:  Normal saline to be started @ 169ml/hr Safety Measures: Safety Fall Prevention Plan has been given, discussed & signed, non skid socks in place, bed alarm activated. 2A Orientation: Patient has been orientated to the room, unit & staff.   Orders have been reviewed & implemented. Will continue to monitor the patient. Call light has been placed within reach.  Georgian Co, RN

## 2016-04-20 NOTE — Discharge Summary (Addendum)
[facility] [place] NAME: Vanessa Evans    MR#:  [id number]  DATE OF BIRTH:  11-07-1945  DATE OF ADMISSION:  04/15/2016   ADMITTING PHYSICIAN: Harrie Foreman, MD  DATE OF DISCHARGE:  04/18/2016  PRIMARY CARE PHYSICIAN: Lavera Guise, MD   ADMISSION DIAGNOSIS:   Tachycardia [R00.0] Acute dyspnea [R06.00] Chest pain, unspecified chest pain type [R07.9]  DISCHARGE DIAGNOSIS:   Active Problems:   SOB (shortness of breath)   SECONDARY DIAGNOSIS:   Past Medical History:  Diagnosis Date  . Anxiety   . Arthritis    osteo  . Asthma    needs rescue inhaler few times a year  . Depression   . Dizziness   . Dysrhythmia    tachycardia  . GERD (gastroesophageal reflux disease)   . Headache(784.0)   . Heart murmur    aortic  . Hyperlipidemia   . Hypertension   . Migraine   . Orthopnea   . Pneumonia    hx  . PONV (postoperative nausea and vomiting)    very anxious during cataract surgery  . Shortness of breath    any exertion, cannot lie flat    HOSPITAL COURSE:    70 year old female with past medical history significant for anxiety, arthritis, depression, sinus tachycardia, hypothyroidism presents to the hospital secondary to worsening shortness of breath and chest pain.  #1 Dyspnea on admission- likely from anxiety And also from abdominal pain -CT angiogram negative for pulmonary embolism. -Appreciate cardiology consult. Echocardiogram normal. -Troponins negative and myoview normal  #2 chronic sinus tachycardia-h/o SVT. continue home dose of Cardizem.  Rate is better controlled  #3 anxiety-recently started on low-dose Celexa, patient hasn't started taking it as an outpatient and has refused here.continue taking it, has a lot of anxiety  #4 Abdominal pain- recent vaginal hysterectomy and cystocele repair Seen by Harle Battiest in the hospital- CT abdomen and pelvis showing fluid collection in pelvis- could be  hematoma, post op fluid collection- lack of fevers and wbc elevation- less likely to be abscess per Gyn, so did not recommend any antibiotics - Discussed findings with patient and further Gyn outpatient follow up needed and may follow up scan,.  #5 hypothyroidism-continue Synthroid  #6 GERD-Protonix  Being discharged home today   DISCHARGE CONDITIONS:   Stable  CONSULTS OBTAINED:   Treatment Team:  Corey Skains, MD  DRUG ALLERGIES:   Allergies  Allergen Reactions  . Codeine Shortness Of Breath and Itching    Bronchospasms and asthma attach  . Ultram [Tramadol] Itching and Other (See Comments)    Bronchospasm and asthma attack  . Nitrofuran Derivatives Diarrhea    Severe and long lasting  . Levaquin [Levofloxacin In D5w] Other (See Comments)    Sensation of being drunk, confused, caused a ruptured tendon in foot  . Ultracet [Tramadol-Acetaminophen] Itching  . Ciprofloxacin Other (See Comments)    Severe pain in neck and down back   . Nsaids Other (See Comments)    Bloody stools, abdominal pain, can tolerate naproxen in low doses.  . Sulfa Antibiotics Itching, Rash and Other (See Comments)    fever   DISCHARGE MEDICATIONS:     Medication List    STOP taking these medications   HYDROmorphone 2 MG tablet Commonly known as:  DILAUDID   UNABLE TO FIND     TAKE these medications   acetaminophen 500 MG tablet Commonly known as:  TYLENOL Take 1,000 mg by mouth every  6 (six) hours as needed.   albuterol 108 (90 Base) MCG/ACT inhaler Commonly known as:  PROVENTIL HFA;VENTOLIN HFA Inhale 2 puffs into the lungs every 6 (six) hours as needed for wheezing.   aspirin EC 81 MG tablet Take 81 mg by mouth daily.   BEN GAY EX Apply 1 application topically 3 (three) times daily.   BENADRYL ALLERGY PO Take 2 tablets by mouth at bedtime as needed. For allergies   benzonatate 100 MG capsule Commonly known as:  TESSALON Take 100-200 mg by mouth 3 (three) times  daily as needed for cough.   BONIVA 150 MG tablet Generic drug:  ibandronate Take 150 mg by mouth every 30 (thirty) days. Take in the morning with a full glass of water, on an empty stomach, and do not take anything else by mouth or lie down for the next 30 min.   CALCIUM 600 + D PO Take 1 tablet by mouth 2 (two) times daily.   cholecalciferol 1000 units tablet Commonly known as:  VITAMIN D Take 1,000 Units by mouth daily.   citalopram 10 MG tablet Commonly known as:  CELEXA Take 5 mg by mouth as needed.   cyclobenzaprine 10 MG tablet Commonly known as:  FLEXERIL Take 5 mg by mouth 2 (two) times daily as needed for muscle spasms.   diclofenac sodium 1 % Gel Commonly known as:  VOLTAREN Apply topically 3 (three) times daily as needed.   diltiazem 120 MG 24 hr capsule Commonly known as:  DILACOR XR Take 120 mg by mouth at bedtime.   docusate sodium 100 MG capsule Commonly known as:  COLACE Take 100-300 mg by mouth at bedtime as needed for constipation.   EPIPEN IJ Inject as directed.   ESTRACE VAGINAL 0.1 MG/GM vaginal cream Generic drug:  estradiol at bedtime. Apply a pea-sized amount to urethra at bedtime   fenofibrate 160 MG tablet Take 160 mg by mouth at bedtime.   folic acid A999333 MCG tablet Commonly known as:  FOLVITE Take 400 mcg by mouth daily.   HYDROcodone-acetaminophen 5-325 MG tablet Commonly known as:  NORCO/VICODIN Take 1-2 tablets by mouth every 6 (six) hours as needed for moderate pain or severe pain.   levocetirizine 5 MG tablet Commonly known as:  XYZAL Take 5 mg by mouth daily as needed.   Levothyroxine Sodium 75 MCG Caps Take 75 mcg by mouth daily before breakfast.   Magnesium 250 MG Tabs Take 500 mg by mouth daily.   Melatonin 5 MG Tabs Take 1 tablet by mouth at bedtime as needed. For sleep   multivitamin with minerals tablet Take 1 tablet by mouth daily.   pantoprazole 40 MG tablet Commonly known as:  PROTONIX Take 40 mg by mouth  as needed.   vitamin C 500 MG tablet Commonly known as:  ASCORBIC ACID Take 500 mg by mouth daily.   zinc gluconate 50 MG tablet Take 50 mg by mouth daily.        DISCHARGE INSTRUCTIONS:   1. Obgyn follow up in 1 week 2. PCP f/u in 1-2 weeks  DIET:   Low sodium diet  ACTIVITY:   Activity as tolerated  OXYGEN:   Home Oxygen: No.  Oxygen Delivery: room air  DISCHARGE LOCATION:   home   If you experience worsening of your admission symptoms, develop shortness of breath, life threatening emergency, suicidal or homicidal thoughts you must seek medical attention immediately by calling 911 or calling your MD immediately  if symptoms less  severe.  You Must read complete instructions/literature along with all the possible adverse reactions/side effects for all the Medicines you take and that have been prescribed to you. Take any new Medicines after you have completely understood and accpet all the possible adverse reactions/side effects.   Please note  You were cared for by a hospitalist during your hospital stay. If you have any questions about your discharge medications or the care you received while you were in the hospital after you are discharged, you can call the unit and asked to speak with the hospitalist on call if the hospitalist that took care of you is not available. Once you are discharged, your primary care physician will handle any further medical issues. Please note that NO REFILLS for any discharge medications will be authorized once you are discharged, as it is imperative that you return to your primary care physician (or establish a relationship with a primary care physician if you do not have one) for your aftercare needs so that they can reassess your need for medications and monitor your lab values.    On the day of Discharge:  VITAL SIGNS:   Blood pressure 121/64, pulse 88, temperature 98 F (36.7 C), temperature source Oral, resp. rate 18, height 5\' 1"   (1.549 m), weight 55.9 kg (123 lb 3.2 oz), SpO2 96 %.  PHYSICAL EXAMINATION:    GENERAL:  70 y.o.-year-old patient lying in the bed with no acute distress.  EYES: Pupils equal, round, reactive to light and accommodation. No scleral icterus. Extraocular muscles intact.  HEENT: Head atraumatic, normocephalic. Oropharynx and nasopharynx clear.  NECK:  Supple, no jugular venous distention. No thyroid enlargement, no tenderness.  LUNGS: Normal breath sounds bilaterally, no wheezing, rales,rhonchi or crepitation. No use of accessory muscles of respiration.  CARDIOVASCULAR: S1, S2 normal. No murmurs, rubs, or gallops.  ABDOMEN: Soft, nontender, nondistended. Bowel sounds present. No organomegaly or mass.  EXTREMITIES: No pedal edema, cyanosis, or clubbing.  NEUROLOGIC: Cranial nerves II through XII are intact. Muscle strength 5/5 in all extremities. Sensation intact. Gait not checked.  PSYCHIATRIC: The patient is alert and oriented x 3.  SKIN: No obvious rash, lesion, or ulcer.   DATA REVIEW:   CBC  Recent Labs Lab 04/15/16 2145  WBC 8.5  HGB 11.7*  HCT 34.0*  PLT 301    Chemistries   Recent Labs Lab 04/15/16 2145 04/17/16 0337  NA 139 143  K 3.3* 3.5  CL 108 114*  CO2 24 20*  GLUCOSE 103* 103*  BUN 16 11  CREATININE 0.60 0.56  CALCIUM 9.4 8.7*  MG 2.1  --   AST 22  --   ALT 21  --   ALKPHOS 34*  --   BILITOT 0.7  --      Microbiology Results  Results for orders placed or performed during the hospital encounter of 04/03/16  Surgical pcr screen     Status: None   Collection Time: 04/03/16  1:04 PM  Result Value Ref Range Status   MRSA, PCR NEGATIVE NEGATIVE Final   Staphylococcus aureus NEGATIVE NEGATIVE Final    Comment:        The Xpert SA Assay (FDA approved for NASAL specimens in patients over 38 years of age), is one component of a comprehensive surveillance program.  Test performance has been validated by [facility] for patients greater than or equal  to 52 year old. It is not intended to diagnose infection nor to guide or monitor treatment.  RADIOLOGY:  No results found.   Management plans discussed with the patient, family and they are in agreement.  CODE STATUS:     Code Status Orders        Start     Ordered   04/16/16 0454  Full code  Continuous     04/16/16 0453    Code Status History    Date Active Date Inactive Code Status Order ID Comments User Context   04/16/2016  4:54 AM 04/16/2016  7:49 AM Full Code [id number]  Harrie Foreman, MD ED   04/10/2016 12:04 AM 04/10/2016  5:07 PM Full Code [id number]  Gae Dry, MD Inpatient      TOTAL TIME TAKING CARE OF THIS PATIENT: 37 minutes.    Gladstone Lighter M.D on 04/20/2016 at 6:29 PM  Between 7am to 6pm - Pager - [phone]  After 6pm go to [email or web address] - Proofreader  [facility]  Office  [phone]  CC: Primary care physician; Lavera Guise, MD   Note: This dictation was prepared with Dragon dictation along with smaller phrase technology. Any transcriptional errors that result from this process are unintentional.

## 2016-04-21 ENCOUNTER — Telehealth: Payer: Self-pay | Admitting: Emergency Medicine

## 2016-04-21 NOTE — Telephone Encounter (Signed)
Called patient to inform patient of thyroid nodule on the ct scan and need for follow up ultrasound to see it better.  pateint says that dr Humphrey Rolls is aware and has been following several nodules on her thyroid.

## 2016-04-22 LAB — NM MYOCAR MULTI W/SPECT W/WALL MOTION / EF
CHL CUP STRESS STAGE 2 GRADE: 0 %
CHL CUP STRESS STAGE 2 HR: 83 {beats}/min
CHL CUP STRESS STAGE 2 SPEED: 0 mph
CHL CUP STRESS STAGE 3 HR: 81 {beats}/min
CHL CUP STRESS STAGE 5 HR: 98 {beats}/min
CHL CUP STRESS STAGE 5 SPEED: 0 mph
CHL CUP STRESS STAGE 6 DBP: 39 mmHg
CSEPEW: 1 METS
CSEPPHR: 104 {beats}/min
CSEPPMHR: 69 %
LV sys vol: 11 mL
LVDIAVOL: 64 mL (ref 46–106)
NUC STRESS TID: 1.06
Rest HR: 75 {beats}/min
SDS: 0
SRS: 0
SSS: 0
Stage 1 Grade: 0 %
Stage 1 HR: 76 {beats}/min
Stage 1 Speed: 0 mph
Stage 3 Grade: 0 %
Stage 3 Speed: 0 mph
Stage 4 Grade: 0 %
Stage 4 HR: 104 {beats}/min
Stage 4 Speed: 0 mph
Stage 5 Grade: 0 %
Stage 6 Grade: 0 %
Stage 6 HR: 93 {beats}/min
Stage 6 SBP: 113 mmHg
Stage 6 Speed: 0 mph

## 2016-04-24 DIAGNOSIS — J3089 Other allergic rhinitis: Secondary | ICD-10-CM | POA: Diagnosis not present

## 2016-04-24 DIAGNOSIS — F419 Anxiety disorder, unspecified: Secondary | ICD-10-CM | POA: Diagnosis not present

## 2016-04-24 DIAGNOSIS — J301 Allergic rhinitis due to pollen: Secondary | ICD-10-CM | POA: Diagnosis not present

## 2016-04-24 DIAGNOSIS — E876 Hypokalemia: Secondary | ICD-10-CM | POA: Diagnosis not present

## 2016-04-24 DIAGNOSIS — J4540 Moderate persistent asthma, uncomplicated: Secondary | ICD-10-CM | POA: Diagnosis not present

## 2016-04-24 DIAGNOSIS — N390 Urinary tract infection, site not specified: Secondary | ICD-10-CM | POA: Diagnosis not present

## 2016-04-24 DIAGNOSIS — R1030 Lower abdominal pain, unspecified: Secondary | ICD-10-CM | POA: Diagnosis not present

## 2016-04-27 DIAGNOSIS — I471 Supraventricular tachycardia: Secondary | ICD-10-CM | POA: Diagnosis not present

## 2016-04-27 DIAGNOSIS — I10 Essential (primary) hypertension: Secondary | ICD-10-CM | POA: Diagnosis not present

## 2016-04-27 DIAGNOSIS — E782 Mixed hyperlipidemia: Secondary | ICD-10-CM | POA: Diagnosis not present

## 2016-04-27 DIAGNOSIS — E876 Hypokalemia: Secondary | ICD-10-CM | POA: Diagnosis not present

## 2016-04-27 DIAGNOSIS — R0602 Shortness of breath: Secondary | ICD-10-CM | POA: Diagnosis not present

## 2016-05-04 DIAGNOSIS — J301 Allergic rhinitis due to pollen: Secondary | ICD-10-CM | POA: Diagnosis not present

## 2016-05-04 DIAGNOSIS — J3089 Other allergic rhinitis: Secondary | ICD-10-CM | POA: Diagnosis not present

## 2016-05-15 DIAGNOSIS — R799 Abnormal finding of blood chemistry, unspecified: Secondary | ICD-10-CM | POA: Diagnosis not present

## 2016-05-18 DIAGNOSIS — J301 Allergic rhinitis due to pollen: Secondary | ICD-10-CM | POA: Diagnosis not present

## 2016-05-18 DIAGNOSIS — J4540 Moderate persistent asthma, uncomplicated: Secondary | ICD-10-CM | POA: Diagnosis not present

## 2016-05-18 DIAGNOSIS — J3089 Other allergic rhinitis: Secondary | ICD-10-CM | POA: Diagnosis not present

## 2016-06-01 DIAGNOSIS — J3089 Other allergic rhinitis: Secondary | ICD-10-CM | POA: Diagnosis not present

## 2016-06-01 DIAGNOSIS — J301 Allergic rhinitis due to pollen: Secondary | ICD-10-CM | POA: Diagnosis not present

## 2016-06-10 DIAGNOSIS — Z23 Encounter for immunization: Secondary | ICD-10-CM | POA: Diagnosis not present

## 2016-06-11 DIAGNOSIS — J3089 Other allergic rhinitis: Secondary | ICD-10-CM | POA: Diagnosis not present

## 2016-06-11 DIAGNOSIS — J301 Allergic rhinitis due to pollen: Secondary | ICD-10-CM | POA: Diagnosis not present

## 2016-06-15 DIAGNOSIS — J4540 Moderate persistent asthma, uncomplicated: Secondary | ICD-10-CM | POA: Diagnosis not present

## 2016-06-15 DIAGNOSIS — I10 Essential (primary) hypertension: Secondary | ICD-10-CM | POA: Diagnosis not present

## 2016-06-15 DIAGNOSIS — J301 Allergic rhinitis due to pollen: Secondary | ICD-10-CM | POA: Diagnosis not present

## 2016-06-15 DIAGNOSIS — E042 Nontoxic multinodular goiter: Secondary | ICD-10-CM | POA: Diagnosis not present

## 2016-06-15 DIAGNOSIS — M810 Age-related osteoporosis without current pathological fracture: Secondary | ICD-10-CM | POA: Diagnosis not present

## 2016-06-15 DIAGNOSIS — Z0001 Encounter for general adult medical examination with abnormal findings: Secondary | ICD-10-CM | POA: Diagnosis not present

## 2016-06-15 DIAGNOSIS — J3089 Other allergic rhinitis: Secondary | ICD-10-CM | POA: Diagnosis not present

## 2016-06-15 DIAGNOSIS — Z23 Encounter for immunization: Secondary | ICD-10-CM | POA: Diagnosis not present

## 2016-06-15 DIAGNOSIS — R7301 Impaired fasting glucose: Secondary | ICD-10-CM | POA: Diagnosis not present

## 2016-06-29 ENCOUNTER — Encounter: Payer: Self-pay | Admitting: Podiatry

## 2016-06-29 ENCOUNTER — Ambulatory Visit (INDEPENDENT_AMBULATORY_CARE_PROVIDER_SITE_OTHER): Payer: Medicare Other | Admitting: Podiatry

## 2016-06-29 ENCOUNTER — Ambulatory Visit (INDEPENDENT_AMBULATORY_CARE_PROVIDER_SITE_OTHER): Payer: Medicare Other

## 2016-06-29 DIAGNOSIS — B351 Tinea unguium: Secondary | ICD-10-CM | POA: Diagnosis not present

## 2016-06-29 DIAGNOSIS — M7751 Other enthesopathy of right foot: Secondary | ICD-10-CM

## 2016-06-29 DIAGNOSIS — L821 Other seborrheic keratosis: Secondary | ICD-10-CM | POA: Diagnosis not present

## 2016-06-29 DIAGNOSIS — L570 Actinic keratosis: Secondary | ICD-10-CM | POA: Diagnosis not present

## 2016-06-29 DIAGNOSIS — M79676 Pain in unspecified toe(s): Secondary | ICD-10-CM

## 2016-06-29 DIAGNOSIS — M79671 Pain in right foot: Secondary | ICD-10-CM

## 2016-06-29 DIAGNOSIS — X32XXXA Exposure to sunlight, initial encounter: Secondary | ICD-10-CM | POA: Diagnosis not present

## 2016-06-29 DIAGNOSIS — M76821 Posterior tibial tendinitis, right leg: Secondary | ICD-10-CM

## 2016-06-29 DIAGNOSIS — J3089 Other allergic rhinitis: Secondary | ICD-10-CM | POA: Diagnosis not present

## 2016-06-29 DIAGNOSIS — J301 Allergic rhinitis due to pollen: Secondary | ICD-10-CM | POA: Diagnosis not present

## 2016-06-29 NOTE — Progress Notes (Signed)
   Subjective:    Patient ID: Vanessa Evans, female    DOB: 1945/10/06, 70 y.o.   MRN: [id number]  HPI: She presents today with chief complaint of pain to the posterior medial aspect of the right foot. She states is an aching for about 1 week swelling on and off. She denies any injury to the foot. But did notice it more when he was swelling after a long hiking trip. She has a Cam Walker at home which she had been using but it is about the follow-up R. She states that it did help to some degree. She states that she walks barefooted in the house.    Review of Systems  All other systems reviewed and are negative.      Objective:   Physical Exam: Vital signs are stable alert and oriented 3. Pulses are palpable. Neurologic sensorium is intact. Deep tendon reflexes are intact. Muscle strength is normal bilateral. Orthopedic evaluation demonstrates pes planus with pain on palpation of the posterior tibial tendon just beneath the medial malleolus with fluctuance. There is a small nodule just inferior to the tendon itself which appears to be her neuroma or partial tear within the tendon itself. Radiographs demonstrate moderate severe osteoarthritic changes from the metatarsophalangeal joints proximally to the subtalar joint. No fractures are identified. She also has long thick yellow dystrophic onychomycotic nails which are painful ingrown toenails were sharply incurvated nail margins.        Assessment & Plan:  Assessment: Pes planus with osteoarthritis and posterior tibial tendon dysfunction with posterior tibial tendinitis. Pain in limb Segner to onychomycosis and nail dystrophy.  Plan: I injected dexamethasone to the point of maximal tenderness today making sure not to inject into the tendon. Placed her in a Cam Walker discussed appropriate shoe gear stretching excise ice therapy. Medications. I debrided her toenails for her today is covered service. Follow up with her in 1 month which time we  may need to perform an MRI.

## 2016-07-13 DIAGNOSIS — E039 Hypothyroidism, unspecified: Secondary | ICD-10-CM | POA: Diagnosis not present

## 2016-07-13 DIAGNOSIS — Z0001 Encounter for general adult medical examination with abnormal findings: Secondary | ICD-10-CM | POA: Diagnosis not present

## 2016-07-13 DIAGNOSIS — E782 Mixed hyperlipidemia: Secondary | ICD-10-CM | POA: Diagnosis not present

## 2016-07-13 DIAGNOSIS — E859 Amyloidosis, unspecified: Secondary | ICD-10-CM | POA: Diagnosis not present

## 2016-07-13 DIAGNOSIS — J301 Allergic rhinitis due to pollen: Secondary | ICD-10-CM | POA: Diagnosis not present

## 2016-07-13 DIAGNOSIS — J3089 Other allergic rhinitis: Secondary | ICD-10-CM | POA: Diagnosis not present

## 2016-07-27 ENCOUNTER — Encounter: Payer: Self-pay | Admitting: Podiatry

## 2016-07-27 ENCOUNTER — Ambulatory Visit (INDEPENDENT_AMBULATORY_CARE_PROVIDER_SITE_OTHER): Payer: Medicare Other | Admitting: Podiatry

## 2016-07-27 DIAGNOSIS — M25571 Pain in right ankle and joints of right foot: Secondary | ICD-10-CM | POA: Diagnosis not present

## 2016-07-27 DIAGNOSIS — M7751 Other enthesopathy of right foot: Secondary | ICD-10-CM | POA: Diagnosis not present

## 2016-07-27 DIAGNOSIS — M76821 Posterior tibial tendinitis, right leg: Secondary | ICD-10-CM

## 2016-07-27 DIAGNOSIS — M779 Enthesopathy, unspecified: Secondary | ICD-10-CM

## 2016-07-27 NOTE — Progress Notes (Signed)
She presents today for a follow-up posterior tibial tendon pain right. He states that she's doing about 75-80% better at this point. She states that I have pain right. She points to the sinus tarsi of the right foot.  Objective: Vital signs are stable she's alert and oriented 3 much decrease in pain and edema and erythema to the medial side of the foot with posterior tibial tendon resides but she does have pain on palpation of the sinus tarsi of the right foot.  Assessment: Sinus tarsitis capsulitis osteoarthritis right foot. Posterior tibial tendon dysfunction with posterior tibial tendinitis right foot.  Plan: I injected the subtalar joint today with Kenalog and local anesthetic will follow up with her in 1 month. Discussed appropriate shoe gear therapy and the possibility of an MRI next visit.

## 2016-07-29 DIAGNOSIS — R221 Localized swelling, mass and lump, neck: Secondary | ICD-10-CM | POA: Diagnosis not present

## 2016-07-29 DIAGNOSIS — J301 Allergic rhinitis due to pollen: Secondary | ICD-10-CM | POA: Diagnosis not present

## 2016-07-29 DIAGNOSIS — E042 Nontoxic multinodular goiter: Secondary | ICD-10-CM | POA: Diagnosis not present

## 2016-07-29 DIAGNOSIS — J3089 Other allergic rhinitis: Secondary | ICD-10-CM | POA: Diagnosis not present

## 2016-08-10 HISTORY — PX: BIOPSY THYROID: PRO38

## 2016-08-12 DIAGNOSIS — J301 Allergic rhinitis due to pollen: Secondary | ICD-10-CM | POA: Diagnosis not present

## 2016-08-12 DIAGNOSIS — J3089 Other allergic rhinitis: Secondary | ICD-10-CM | POA: Diagnosis not present

## 2016-09-07 ENCOUNTER — Ambulatory Visit (INDEPENDENT_AMBULATORY_CARE_PROVIDER_SITE_OTHER): Payer: Medicare Other | Admitting: Podiatry

## 2016-09-07 DIAGNOSIS — L600 Ingrowing nail: Secondary | ICD-10-CM | POA: Diagnosis not present

## 2016-09-07 MED ORDER — NEOMYCIN-POLYMYXIN-HC 1 % OT SOLN: OTIC | 1 refills | Status: DC

## 2016-09-07 NOTE — Patient Instructions (Signed)

## 2016-09-07 NOTE — Progress Notes (Signed)
She presents today for follow-up of her posterior tibial tendinitis. She states it is doing much better since we injected the sinus tarsi of the right foot last visit. She is complaining today of painful sharp incurvated toenails the tibial border of the hallux bilateral.  Objective: Vital signs are stable alert and oriented 3. Pulses are palpable. Toenails are long thick dystrophic onychomycotic painful incurvation sharp nail margin with erythema and edema and no purulence and no malodor.  Assessment: Ingrown toenail tibial border of the hallux bilateral.  Plan: Chemical matrixectomy was performed today after local anesthesia was administered. She tolerated procedure well without complications and prescription was dispensed for Cortisporin Otic which will be applied twice daily after soaking. Soaking instructions were provided. Follow-up with her in 1-2 weeks.

## 2016-09-21 ENCOUNTER — Ambulatory Visit (INDEPENDENT_AMBULATORY_CARE_PROVIDER_SITE_OTHER): Payer: Self-pay | Admitting: Podiatry

## 2016-09-21 DIAGNOSIS — J45991 Cough variant asthma: Secondary | ICD-10-CM | POA: Diagnosis not present

## 2016-09-21 DIAGNOSIS — J301 Allergic rhinitis due to pollen: Secondary | ICD-10-CM | POA: Diagnosis not present

## 2016-09-21 DIAGNOSIS — L600 Ingrowing nail: Secondary | ICD-10-CM

## 2016-09-21 DIAGNOSIS — J4540 Moderate persistent asthma, uncomplicated: Secondary | ICD-10-CM | POA: Diagnosis not present

## 2016-09-21 NOTE — Progress Notes (Signed)
Presents today for 2 week follow-up of bilateral ingrown toenail removal. She states that they are getting better she denies fever chills nausea vomiting muscle pains that she continues to soak.  Objective: Vital signs are stable alert and oriented 3. No erythema edema cellulitis drainage or odor.  Assessment: Well-healed surgical toes hallux bilateral.  Plan: Discontinue Betadine so with Epsom salts and warm water soaks spelled in the daily at bedtime.

## 2016-10-02 DIAGNOSIS — M7062 Trochanteric bursitis, left hip: Secondary | ICD-10-CM | POA: Diagnosis not present

## 2016-10-02 DIAGNOSIS — N390 Urinary tract infection, site not specified: Secondary | ICD-10-CM | POA: Diagnosis not present

## 2016-10-02 DIAGNOSIS — J3089 Other allergic rhinitis: Secondary | ICD-10-CM | POA: Diagnosis not present

## 2016-10-02 DIAGNOSIS — M7542 Impingement syndrome of left shoulder: Secondary | ICD-10-CM | POA: Diagnosis not present

## 2016-10-02 DIAGNOSIS — M719 Bursopathy, unspecified: Secondary | ICD-10-CM | POA: Diagnosis not present

## 2016-10-02 DIAGNOSIS — M25552 Pain in left hip: Secondary | ICD-10-CM | POA: Diagnosis not present

## 2016-10-02 DIAGNOSIS — J301 Allergic rhinitis due to pollen: Secondary | ICD-10-CM | POA: Diagnosis not present

## 2016-10-02 DIAGNOSIS — R221 Localized swelling, mass and lump, neck: Secondary | ICD-10-CM | POA: Diagnosis not present

## 2016-10-02 DIAGNOSIS — I10 Essential (primary) hypertension: Secondary | ICD-10-CM | POA: Diagnosis not present

## 2016-10-02 DIAGNOSIS — J449 Chronic obstructive pulmonary disease, unspecified: Secondary | ICD-10-CM | POA: Diagnosis not present

## 2016-10-02 DIAGNOSIS — M25512 Pain in left shoulder: Secondary | ICD-10-CM | POA: Diagnosis not present

## 2016-10-14 ENCOUNTER — Ambulatory Visit (INDEPENDENT_AMBULATORY_CARE_PROVIDER_SITE_OTHER): Payer: Medicare Other | Admitting: Podiatry

## 2016-10-14 DIAGNOSIS — L600 Ingrowing nail: Secondary | ICD-10-CM | POA: Diagnosis not present

## 2016-10-14 MED ORDER — AMOXICILLIN-POT CLAVULANATE 500-125 MG PO TABS: 1.0000 | ORAL_TABLET | Freq: Two times a day (BID) | ORAL | 0 refills | Status: DC

## 2016-10-14 NOTE — Progress Notes (Signed)
She presents today with a chief complaint of pain and tenderness at the site of matrixectomy times past 4 days. She states that she has tried to take anti-inflammatories and she's also been soaking. She states that it seems to be doing a little better today.  Objective: Matrixectomy's were performed January 29 and she's been doing very well since that time. States that she had discontinued soaking. At this point there is mild erythema to the left tibial border proximally none on the right foot.  Assessment: Paronychia status post second 3.  Plan: Dispensed Augmentin and's continue soaking in Epsom salts and warm water follow up with me in 2 weeks.

## 2016-10-28 ENCOUNTER — Ambulatory Visit (INDEPENDENT_AMBULATORY_CARE_PROVIDER_SITE_OTHER): Payer: Medicare Other | Admitting: Podiatry

## 2016-10-28 DIAGNOSIS — L03032 Cellulitis of left toe: Secondary | ICD-10-CM

## 2016-10-28 MED ORDER — DOXYCYCLINE HYCLATE 100 MG PO TABS: 100.0000 mg | ORAL_TABLET | Freq: Two times a day (BID) | ORAL | 0 refills | Status: DC

## 2016-10-28 NOTE — Patient Instructions (Signed)

## 2016-10-28 NOTE — Progress Notes (Signed)
She presents today for follow-up of her ingrown toenail. She states that she took all of her antibiotic and really didn't make any difference. She states it is still tender she points to the tibial border.  Objective: Proximal erythema along the tibial border mild tenderness no purulence no malodor. As we debrided the nail after numbing of the toe and noticed there was a spicule of nail present. I removed this and performed chemical matrixectomy.  Assessment: Residual ingrown nail hallux left.  Plan: Chemical matrixectomy tolerated well I did go ahead and write her prescription for Vioxx but I'll follow-up with her in

## 2016-11-02 ENCOUNTER — Ambulatory Visit: Payer: Medicare Other | Admitting: Podiatry

## 2016-11-02 DIAGNOSIS — J3089 Other allergic rhinitis: Secondary | ICD-10-CM | POA: Diagnosis not present

## 2016-11-02 DIAGNOSIS — J301 Allergic rhinitis due to pollen: Secondary | ICD-10-CM | POA: Diagnosis not present

## 2016-11-16 ENCOUNTER — Ambulatory Visit: Payer: Medicare Other | Admitting: Podiatry

## 2016-11-16 ENCOUNTER — Ambulatory Visit (INDEPENDENT_AMBULATORY_CARE_PROVIDER_SITE_OTHER): Payer: Medicare Other | Admitting: Podiatry

## 2016-11-16 ENCOUNTER — Encounter: Payer: Self-pay | Admitting: Podiatry

## 2016-11-16 DIAGNOSIS — L03032 Cellulitis of left toe: Secondary | ICD-10-CM

## 2016-11-16 DIAGNOSIS — I493 Ventricular premature depolarization: Secondary | ICD-10-CM | POA: Diagnosis not present

## 2016-11-16 DIAGNOSIS — J3089 Other allergic rhinitis: Secondary | ICD-10-CM | POA: Diagnosis not present

## 2016-11-16 DIAGNOSIS — R0602 Shortness of breath: Secondary | ICD-10-CM | POA: Diagnosis not present

## 2016-11-16 DIAGNOSIS — I10 Essential (primary) hypertension: Secondary | ICD-10-CM | POA: Diagnosis not present

## 2016-11-16 DIAGNOSIS — E782 Mixed hyperlipidemia: Secondary | ICD-10-CM | POA: Diagnosis not present

## 2016-11-16 DIAGNOSIS — R2681 Unsteadiness on feet: Secondary | ICD-10-CM

## 2016-11-16 DIAGNOSIS — I471 Supraventricular tachycardia: Secondary | ICD-10-CM | POA: Diagnosis not present

## 2016-11-16 DIAGNOSIS — J301 Allergic rhinitis due to pollen: Secondary | ICD-10-CM | POA: Diagnosis not present

## 2016-11-16 NOTE — Patient Instructions (Signed)

## 2016-11-16 NOTE — Progress Notes (Signed)
She presents today for follow-up of her matrixectomy hallux bilateral. She states that her toenails are doing great however she notices that she is becoming more unstable as she walks. She states that she has fallen multiple times and feels that she has weakness in her ankles and is unable to maintain her balance regularly.  Objective: Vital signs are stable she is alert and oriented 3. Pulses are palpable. Her nails appear to be growing in just fine is no signs of infection no erythema edema cellulitis drainage or odor at the matrixectomy sites of each hallux. She does walk with a widened gait and does not very sturdy.  Assessment: Gait abnormality well healing surgical toes hallux bilateral.  Plan: Chemical matrixectomy is healing very nicely I recommended she discontinue soaking I did recommend that she follow-up with Liliane Channel for a pair ofMoore balance braces.

## 2016-11-25 DIAGNOSIS — J3089 Other allergic rhinitis: Secondary | ICD-10-CM | POA: Diagnosis not present

## 2016-11-25 DIAGNOSIS — J301 Allergic rhinitis due to pollen: Secondary | ICD-10-CM | POA: Diagnosis not present

## 2016-11-26 ENCOUNTER — Ambulatory Visit: Payer: Medicare Other

## 2016-11-30 DIAGNOSIS — Z87898 Personal history of other specified conditions: Secondary | ICD-10-CM | POA: Diagnosis not present

## 2016-11-30 DIAGNOSIS — E042 Nontoxic multinodular goiter: Secondary | ICD-10-CM | POA: Diagnosis not present

## 2016-11-30 DIAGNOSIS — J301 Allergic rhinitis due to pollen: Secondary | ICD-10-CM | POA: Diagnosis not present

## 2016-11-30 DIAGNOSIS — E039 Hypothyroidism, unspecified: Secondary | ICD-10-CM | POA: Diagnosis not present

## 2016-11-30 DIAGNOSIS — J3089 Other allergic rhinitis: Secondary | ICD-10-CM | POA: Diagnosis not present

## 2016-12-07 DIAGNOSIS — H1013 Acute atopic conjunctivitis, bilateral: Secondary | ICD-10-CM | POA: Diagnosis not present

## 2016-12-14 DIAGNOSIS — J301 Allergic rhinitis due to pollen: Secondary | ICD-10-CM | POA: Diagnosis not present

## 2016-12-14 DIAGNOSIS — J3089 Other allergic rhinitis: Secondary | ICD-10-CM | POA: Diagnosis not present

## 2016-12-24 ENCOUNTER — Ambulatory Visit (INDEPENDENT_AMBULATORY_CARE_PROVIDER_SITE_OTHER): Payer: Medicare Other | Admitting: Podiatry

## 2016-12-24 DIAGNOSIS — R2681 Unsteadiness on feet: Secondary | ICD-10-CM

## 2016-12-24 NOTE — Progress Notes (Signed)
Patient came in today to pick up  Waldron...the fit was good with not gaps nor excessive pressure evident.Marland KitchenMarland KitchenPatient was able to don/doff the braces and easily put them into the shoes she wore... She was very pleased and could tell an immediate improvement in gait steadiness...  Bill codes [id number], [id number], [id number] x 2

## 2016-12-28 DIAGNOSIS — J301 Allergic rhinitis due to pollen: Secondary | ICD-10-CM | POA: Diagnosis not present

## 2016-12-28 DIAGNOSIS — J3089 Other allergic rhinitis: Secondary | ICD-10-CM | POA: Diagnosis not present

## 2016-12-28 DIAGNOSIS — E042 Nontoxic multinodular goiter: Secondary | ICD-10-CM | POA: Diagnosis not present

## 2017-01-12 DIAGNOSIS — J301 Allergic rhinitis due to pollen: Secondary | ICD-10-CM | POA: Diagnosis not present

## 2017-01-12 DIAGNOSIS — J3089 Other allergic rhinitis: Secondary | ICD-10-CM | POA: Diagnosis not present

## 2017-01-25 ENCOUNTER — Encounter: Payer: Self-pay | Admitting: Podiatry

## 2017-01-25 ENCOUNTER — Ambulatory Visit (INDEPENDENT_AMBULATORY_CARE_PROVIDER_SITE_OTHER): Payer: Medicare Other | Admitting: Podiatry

## 2017-01-25 DIAGNOSIS — M7661 Achilles tendinitis, right leg: Secondary | ICD-10-CM

## 2017-01-25 DIAGNOSIS — M2140 Flat foot [pes planus] (acquired), unspecified foot: Secondary | ICD-10-CM

## 2017-01-25 DIAGNOSIS — E039 Hypothyroidism, unspecified: Secondary | ICD-10-CM | POA: Diagnosis not present

## 2017-01-25 DIAGNOSIS — E042 Nontoxic multinodular goiter: Secondary | ICD-10-CM | POA: Diagnosis not present

## 2017-01-25 DIAGNOSIS — M76821 Posterior tibial tendinitis, right leg: Secondary | ICD-10-CM

## 2017-01-25 DIAGNOSIS — J3089 Other allergic rhinitis: Secondary | ICD-10-CM | POA: Diagnosis not present

## 2017-01-25 DIAGNOSIS — J301 Allergic rhinitis due to pollen: Secondary | ICD-10-CM | POA: Diagnosis not present

## 2017-01-25 NOTE — Progress Notes (Signed)
She presents today for a chief concern of pain to the right foot and ankle. States the last week and said painful Hartley walk on it. She states the last time she was here we gave a small dexamethasone injection which made it feel much better she is now expressing not only pain in the right posterior tibial tendon area but as well as in the Achilles area.  Objective: Pulses are palpable she has pain on inversion against resistance but the posterior tibial tendon is obviously intact on palpation she has tenderness on palpation of the lateral aspect of cages triangle.  Assessment: Achilles tendinitis posterior tibial tendinitis pes planus.  Plan: Injected the areas today dexamethasone posterior tibial tendon Kager's triangle with Kenalog and local anesthetic. We'll consider MRI if she is not improved.

## 2017-02-08 DIAGNOSIS — J301 Allergic rhinitis due to pollen: Secondary | ICD-10-CM | POA: Diagnosis not present

## 2017-02-08 DIAGNOSIS — I739 Peripheral vascular disease, unspecified: Secondary | ICD-10-CM | POA: Diagnosis not present

## 2017-02-08 DIAGNOSIS — J3089 Other allergic rhinitis: Secondary | ICD-10-CM | POA: Diagnosis not present

## 2017-02-08 DIAGNOSIS — I10 Essential (primary) hypertension: Secondary | ICD-10-CM | POA: Diagnosis not present

## 2017-02-11 ENCOUNTER — Other Ambulatory Visit: Payer: Self-pay | Admitting: Internal Medicine

## 2017-02-11 DIAGNOSIS — Z1231 Encounter for screening mammogram for malignant neoplasm of breast: Secondary | ICD-10-CM

## 2017-02-22 DIAGNOSIS — J301 Allergic rhinitis due to pollen: Secondary | ICD-10-CM | POA: Diagnosis not present

## 2017-02-22 DIAGNOSIS — I739 Peripheral vascular disease, unspecified: Secondary | ICD-10-CM | POA: Diagnosis not present

## 2017-02-22 DIAGNOSIS — J3089 Other allergic rhinitis: Secondary | ICD-10-CM | POA: Diagnosis not present

## 2017-02-23 DIAGNOSIS — J301 Allergic rhinitis due to pollen: Secondary | ICD-10-CM | POA: Diagnosis not present

## 2017-02-23 DIAGNOSIS — J3089 Other allergic rhinitis: Secondary | ICD-10-CM | POA: Diagnosis not present

## 2017-02-24 ENCOUNTER — Telehealth: Payer: Self-pay

## 2017-02-24 NOTE — Telephone Encounter (Signed)
Vanessa Evans from [facility] calling for prior authorization for generic zofran 4mg .  [phone] opt 5. They are trying to complete the coverage decision.

## 2017-02-25 NOTE — Telephone Encounter (Signed)
Phone busy.

## 2017-02-25 NOTE — Telephone Encounter (Signed)
Pt called stating she was notified today from [facility] that they had approved a rx for zoloft.  Pt has never had it before.  Doesn't know what's going on - whether they have her mixed up c someone else or what.  Hasn't seen [place] since 8/17 and he's never rx'd it before.  Please call   Pt called back at 2:38 stating she may have solved the mystery of the medication.They had resubmitted clain to [facility] pharm for drug on my hospital bill that Medicare wouldn't approve and [facility] wouldn't pay it.  That's probably it.  Please call anyway.  [phone]

## 2017-03-05 ENCOUNTER — Ambulatory Visit
Admission: RE | Admit: 2017-03-05 | Discharge: 2017-03-05 | Disposition: A | Discharge status: Home / Self Care | Payer: Medicare Other | Source: Ambulatory Visit | Attending: Internal Medicine | Admitting: Internal Medicine

## 2017-03-05 DIAGNOSIS — J029 Acute pharyngitis, unspecified: Secondary | ICD-10-CM | POA: Diagnosis not present

## 2017-03-05 DIAGNOSIS — R079 Chest pain, unspecified: Secondary | ICD-10-CM | POA: Diagnosis not present

## 2017-03-05 DIAGNOSIS — F419 Anxiety disorder, unspecified: Secondary | ICD-10-CM | POA: Diagnosis not present

## 2017-03-05 DIAGNOSIS — K219 Gastro-esophageal reflux disease without esophagitis: Secondary | ICD-10-CM | POA: Diagnosis not present

## 2017-03-05 DIAGNOSIS — J449 Chronic obstructive pulmonary disease, unspecified: Secondary | ICD-10-CM | POA: Diagnosis not present

## 2017-03-05 DIAGNOSIS — Z1231 Encounter for screening mammogram for malignant neoplasm of breast: Secondary | ICD-10-CM | POA: Diagnosis not present

## 2017-03-05 DIAGNOSIS — J4540 Moderate persistent asthma, uncomplicated: Secondary | ICD-10-CM | POA: Diagnosis not present

## 2017-03-08 ENCOUNTER — Ambulatory Visit (INDEPENDENT_AMBULATORY_CARE_PROVIDER_SITE_OTHER): Payer: Medicare Other | Admitting: Podiatry

## 2017-03-08 ENCOUNTER — Encounter: Payer: Self-pay | Admitting: Podiatry

## 2017-03-08 DIAGNOSIS — M7661 Achilles tendinitis, right leg: Secondary | ICD-10-CM | POA: Diagnosis not present

## 2017-03-08 DIAGNOSIS — M76821 Posterior tibial tendinitis, right leg: Secondary | ICD-10-CM | POA: Diagnosis not present

## 2017-03-08 DIAGNOSIS — J45991 Cough variant asthma: Secondary | ICD-10-CM | POA: Diagnosis not present

## 2017-03-08 DIAGNOSIS — K219 Gastro-esophageal reflux disease without esophagitis: Secondary | ICD-10-CM | POA: Diagnosis not present

## 2017-03-08 DIAGNOSIS — H43823 Vitreomacular adhesion, bilateral: Secondary | ICD-10-CM | POA: Diagnosis not present

## 2017-03-08 DIAGNOSIS — J0101 Acute recurrent maxillary sinusitis: Secondary | ICD-10-CM | POA: Diagnosis not present

## 2017-03-08 DIAGNOSIS — J4540 Moderate persistent asthma, uncomplicated: Secondary | ICD-10-CM | POA: Diagnosis not present

## 2017-03-08 NOTE — Progress Notes (Signed)
She presents today for follow-up of her posterior tibial tendinitis and subtalar joint capsulitis right foot. She states that after injection is doing much better than it was the injections seemed to help.  Objective: Vital signs are stable she's alert and oriented 3. Pulses are palpable. She has some fluctuance within the tendon sheath of the posterior tibial tendon just beneath the medial malleolus. She has no pain on palpation of the sinus tarsi right foot. Much decrease in edema around the ankle in general.  Assessment: Well-healing capsulitis subtalar joint right posterior tibial tendinitis right.  Plan: I think this point since she has no plans to do surgery on getting an MRI would not be necessary other than more specific diagnostics. I will follow up with her on an as-needed basis.

## 2017-03-23 DIAGNOSIS — J301 Allergic rhinitis due to pollen: Secondary | ICD-10-CM | POA: Diagnosis not present

## 2017-03-23 DIAGNOSIS — J3089 Other allergic rhinitis: Secondary | ICD-10-CM | POA: Diagnosis not present

## 2017-04-02 DIAGNOSIS — J3089 Other allergic rhinitis: Secondary | ICD-10-CM | POA: Diagnosis not present

## 2017-04-02 DIAGNOSIS — J301 Allergic rhinitis due to pollen: Secondary | ICD-10-CM | POA: Diagnosis not present

## 2017-04-19 DIAGNOSIS — J301 Allergic rhinitis due to pollen: Secondary | ICD-10-CM | POA: Diagnosis not present

## 2017-04-19 DIAGNOSIS — J3089 Other allergic rhinitis: Secondary | ICD-10-CM | POA: Diagnosis not present

## 2017-05-03 DIAGNOSIS — R7303 Prediabetes: Secondary | ICD-10-CM | POA: Diagnosis not present

## 2017-05-03 DIAGNOSIS — E039 Hypothyroidism, unspecified: Secondary | ICD-10-CM | POA: Diagnosis not present

## 2017-05-03 DIAGNOSIS — J3089 Other allergic rhinitis: Secondary | ICD-10-CM | POA: Diagnosis not present

## 2017-05-03 DIAGNOSIS — J301 Allergic rhinitis due to pollen: Secondary | ICD-10-CM | POA: Diagnosis not present

## 2017-05-03 DIAGNOSIS — E042 Nontoxic multinodular goiter: Secondary | ICD-10-CM | POA: Diagnosis not present

## 2017-05-17 DIAGNOSIS — E042 Nontoxic multinodular goiter: Secondary | ICD-10-CM | POA: Diagnosis not present

## 2017-05-17 DIAGNOSIS — I471 Supraventricular tachycardia: Secondary | ICD-10-CM | POA: Diagnosis not present

## 2017-05-17 DIAGNOSIS — E782 Mixed hyperlipidemia: Secondary | ICD-10-CM | POA: Diagnosis not present

## 2017-05-17 DIAGNOSIS — M810 Age-related osteoporosis without current pathological fracture: Secondary | ICD-10-CM | POA: Diagnosis not present

## 2017-05-17 DIAGNOSIS — J3089 Other allergic rhinitis: Secondary | ICD-10-CM | POA: Diagnosis not present

## 2017-05-17 DIAGNOSIS — I493 Ventricular premature depolarization: Secondary | ICD-10-CM | POA: Diagnosis not present

## 2017-05-17 DIAGNOSIS — J301 Allergic rhinitis due to pollen: Secondary | ICD-10-CM | POA: Diagnosis not present

## 2017-05-17 DIAGNOSIS — K219 Gastro-esophageal reflux disease without esophagitis: Secondary | ICD-10-CM | POA: Diagnosis not present

## 2017-05-17 DIAGNOSIS — R002 Palpitations: Secondary | ICD-10-CM | POA: Diagnosis not present

## 2017-05-17 DIAGNOSIS — I10 Essential (primary) hypertension: Secondary | ICD-10-CM | POA: Diagnosis not present

## 2017-05-17 DIAGNOSIS — R0602 Shortness of breath: Secondary | ICD-10-CM | POA: Diagnosis not present

## 2017-05-24 DIAGNOSIS — Z86010 Personal history of colonic polyps: Secondary | ICD-10-CM | POA: Diagnosis not present

## 2017-05-24 DIAGNOSIS — R1013 Epigastric pain: Secondary | ICD-10-CM | POA: Insufficient documentation

## 2017-05-24 DIAGNOSIS — K219 Gastro-esophageal reflux disease without esophagitis: Secondary | ICD-10-CM | POA: Insufficient documentation

## 2017-05-24 DIAGNOSIS — R1310 Dysphagia, unspecified: Secondary | ICD-10-CM | POA: Diagnosis not present

## 2017-05-24 DIAGNOSIS — I10 Essential (primary) hypertension: Secondary | ICD-10-CM | POA: Diagnosis not present

## 2017-05-24 DIAGNOSIS — I493 Ventricular premature depolarization: Secondary | ICD-10-CM | POA: Diagnosis not present

## 2017-05-24 DIAGNOSIS — E782 Mixed hyperlipidemia: Secondary | ICD-10-CM | POA: Diagnosis not present

## 2017-05-25 DIAGNOSIS — R002 Palpitations: Secondary | ICD-10-CM | POA: Diagnosis not present

## 2017-05-31 DIAGNOSIS — J3089 Other allergic rhinitis: Secondary | ICD-10-CM | POA: Diagnosis not present

## 2017-05-31 DIAGNOSIS — J301 Allergic rhinitis due to pollen: Secondary | ICD-10-CM | POA: Diagnosis not present

## 2017-06-06 DIAGNOSIS — R079 Chest pain, unspecified: Secondary | ICD-10-CM | POA: Diagnosis not present

## 2017-06-07 ENCOUNTER — Encounter: Payer: Self-pay | Admitting: Emergency Medicine

## 2017-06-07 ENCOUNTER — Emergency Department: Payer: Medicare Other

## 2017-06-07 ENCOUNTER — Emergency Department
Admission: EM | Admit: 2017-06-07 | Discharge: 2017-06-07 | Disposition: A | Discharge status: Home / Self Care | Payer: Medicare Other | Attending: Emergency Medicine | Admitting: Emergency Medicine

## 2017-06-07 DIAGNOSIS — I10 Essential (primary) hypertension: Secondary | ICD-10-CM | POA: Diagnosis not present

## 2017-06-07 DIAGNOSIS — Z7982 Long term (current) use of aspirin: Secondary | ICD-10-CM | POA: Insufficient documentation

## 2017-06-07 DIAGNOSIS — J45909 Unspecified asthma, uncomplicated: Secondary | ICD-10-CM | POA: Diagnosis not present

## 2017-06-07 DIAGNOSIS — R0789 Other chest pain: Secondary | ICD-10-CM | POA: Diagnosis not present

## 2017-06-07 DIAGNOSIS — R079 Chest pain, unspecified: Secondary | ICD-10-CM | POA: Diagnosis not present

## 2017-06-07 DIAGNOSIS — Z87891 Personal history of nicotine dependence: Secondary | ICD-10-CM | POA: Insufficient documentation

## 2017-06-07 DIAGNOSIS — Z79899 Other long term (current) drug therapy: Secondary | ICD-10-CM | POA: Diagnosis not present

## 2017-06-07 DIAGNOSIS — R000 Tachycardia, unspecified: Secondary | ICD-10-CM | POA: Diagnosis not present

## 2017-06-07 LAB — BASIC METABOLIC PANEL
Anion gap: 11 (ref 5–15)
BUN: 22 mg/dL — AB (ref 6–20)
CALCIUM: 9.7 mg/dL (ref 8.9–10.3)
CHLORIDE: 104 mmol/L (ref 101–111)
CO2: 23 mmol/L (ref 22–32)
CREATININE: 0.77 mg/dL (ref 0.44–1.00)
GFR calc Af Amer: >60 mL/min (ref >60)
GLUCOSE: 125 mg/dL — AB (ref 65–99)
Potassium: 3.5 mmol/L (ref 3.5–5.1)
SODIUM: 138 mmol/L (ref 135–145)

## 2017-06-07 LAB — TROPONIN I: Troponin I: <0.03 ng/mL (ref <0.03)

## 2017-06-07 LAB — CBC
HCT: 40.6 % (ref 35.0–47.0)
HEMOGLOBIN: 13.4 g/dL (ref 12.0–16.0)
MCH: 28.6 pg (ref 26.0–34.0)
MCHC: 33 g/dL (ref 32.0–36.0)
MCV: 86.5 fL (ref 80.0–100.0)
PLATELETS: 280 10*3/uL (ref 150–440)
RBC: 4.7 MIL/uL (ref 3.80–5.20)
RDW: 13.4 % (ref 11.5–14.5)
WBC: 5.8 10*3/uL (ref 3.6–11.0)

## 2017-06-07 LAB — HEPATIC FUNCTION PANEL
ALBUMIN: 4.4 g/dL (ref 3.5–5.0)
ALT: 27 U/L (ref 14–54)
AST: 32 U/L (ref 15–41)
Alkaline Phosphatase: 43 U/L (ref 38–126)
TOTAL PROTEIN: 7.3 g/dL (ref 6.5–8.1)
Total Bilirubin: 0.4 mg/dL (ref 0.3–1.2)

## 2017-06-07 LAB — LIPASE, BLOOD: Lipase: 35 U/L (ref 11–51)

## 2017-06-07 MED ORDER — SODIUM CHLORIDE 0.9 % IV BOLUS (SEPSIS)
500.0000 mL | INTRAVENOUS | Status: AC
  Administered 2017-06-07: 500 mL via INTRAVENOUS

## 2017-06-07 MED ORDER — IOPAMIDOL (ISOVUE-370) INJECTION 76%
75.0000 mL | Freq: Once | INTRAVENOUS | Status: AC | PRN
  Administered 2017-06-07: 75 mL via INTRAVENOUS

## 2017-06-07 NOTE — Discharge Instructions (Signed)

## 2017-06-07 NOTE — ED Provider Notes (Signed)
[facility] Emergency Department Provider Note  ____________________________________________   First MD Initiated Contact with Patient 06/07/17 0122     (approximate)  I have reviewed the triage vital signs and the nursing notes.   HISTORY  Chief Complaint Chest Pain    HPI Vanessa Evans is a 71 y.o. female whose medical history includes persistent tachycardia and who his cardiologist is Dr. Nehemiah Massed.  She presents by EMS for evaluation of brief headache which quickly resolved but also of acute onset chest pain/pressure.  She states that she is had similar pain in the past but not as severe.  She reports that it was moderate at about a 5 out of 10 and happened approximately an hour and a half prior to arrival.  The onset was acute.  She took 4 baby aspirin at home.  The pain has decreased to a 2 by the time she arrived in the emergency department and further improved when I saw her.  It seems to be a little bit worse when she is moving her and is possibly reproducible with movement.  She reports that she had a Holter monitor for tachycardia but it did not reveal any particular abnormality.  She denies fever/chills, shortness of breath, nausea, vomiting, abdominal pain.  Past Medical History:  Diagnosis Date  . Anxiety   . Arthritis    osteo  . Asthma    needs rescue inhaler few times a year  . Depression   . Dizziness   . Dysrhythmia    tachycardia  . GERD (gastroesophageal reflux disease)   . Headache(784.0)   . Heart murmur    aortic  . Hyperlipidemia   . Hypertension   . Migraine   . Orthopnea   . Pneumonia    hx  . PONV (postoperative nausea and vomiting)    very anxious during cataract surgery  . Shortness of breath    any exertion, cannot lie flat    Patient Active Problem List   Diagnosis Date Noted  . SOB (shortness of breath) 04/16/2016  . Uterine prolapse 04/09/2016  . Cystocele 04/09/2016  . Hyponatremia 04/09/2016     Past Surgical History:  Procedure Laterality Date  . BACK SURGERY    . BREAST BIOPSY Right    benign  . BREAST SURGERY Right    lumpectomy  . CATARACT EXTRACTION W/PHACO Left 12/25/2014   Procedure: CATARACT EXTRACTION PHACO AND INTRAOCULAR LENS PLACEMENT (IOC);  Surgeon: Birder Robson, MD;  Location: [facility] ORS;  Service: Ophthalmology;  Laterality: Left;  [place] 00:32 AP% 20.0 CDE 6.49  . COLONOSCOPY W/ BIOPSIES    . CYSTOCELE REPAIR N/A 04/09/2016   Procedure: ANTERIOR REPAIR (CYSTOCELE);  Surgeon: Gae Dry, MD;  Location: [facility] ORS;  Service: Gynecology;  Laterality: N/A;  . DIAGNOSTIC LAPAROSCOPY    . DILATION AND CURETTAGE OF UTERUS    . EYE SURGERY Bilateral   . HERNIA REPAIR Right 2008   Inguinal hernia Repair  . JOINT REPLACEMENT Right    knee  . KNEE ARTHROSCOPY Right   . LUMBAR LAMINECTOMY/DECOMPRESSION MICRODISCECTOMY Right 03/21/2013   Procedure: Right Lumbar five-Sacral one Laminectomy for Synovial Cyst;  Surgeon: Faythe Ghee, MD;  Location: [facility];  Service: Neurosurgery;  Laterality: Right;  right  . OOPHORECTOMY    . UNILATERAL SALPINGECTOMY Left 04/09/2016   Procedure: UNILATERAL SALPINGECTOMY;  Surgeon: Gae Dry, MD;  Location: [facility] ORS;  Service: Gynecology;  Laterality: Left;  Marland Kitchen VAGINAL HYSTERECTOMY N/A 04/09/2016  Procedure: HYSTERECTOMY VAGINAL;  Surgeon: Gae Dry, MD;  Location: [facility] ORS;  Service: Gynecology;  Laterality: N/A;    Prior to Admission medications   Medication Sig Start Date End Date Taking? Authorizing Provider  acetaminophen (TYLENOL) 500 MG tablet Take 1,000 mg by mouth every 6 (six) hours as needed.    [provider]  albuterol (PROVENTIL HFA;VENTOLIN HFA) 108 (90 BASE) MCG/ACT inhaler Inhale 2 puffs into the lungs every 6 (six) hours as needed for wheezing.    [provider]  aspirin EC 81 MG tablet Take 81 mg by mouth daily.    [provider]  Calcium Carbonate-Vitamin D (CALCIUM  600 + D PO) Take 1 tablet by mouth 2 (two) times daily.     [provider]  cholecalciferol (VITAMIN D) 1000 UNITS tablet Take 1,000 Units by mouth daily.    [provider]  citalopram (CELEXA) 10 MG tablet Take 5 mg by mouth as needed.     [provider]  cyclobenzaprine (FLEXERIL) 10 MG tablet Take 5 mg by mouth 2 (two) times daily as needed for muscle spasms.     [provider]  diclofenac sodium (VOLTAREN) 1 % GEL Apply topically 3 (three) times daily as needed.    [provider]  diltiazem (CARDIZEM CD) 120 MG 24 hr capsule  05/18/16   [provider]  DiphenhydrAMINE HCl (BENADRYL ALLERGY PO) Take 2 tablets by mouth at bedtime as needed. For allergies    [provider]  docusate sodium (COLACE) 100 MG capsule Take 100-300 mg by mouth at bedtime as needed for constipation.    [provider]  doxycycline (VIBRA-TABS) 100 MG tablet Take 1 tablet (100 mg total) by mouth 2 (two) times daily. 10/28/16   Hyatt, Max T, DPM  EPINEPHrine (EPIPEN IJ) Inject as directed.    [provider]  estradiol (ESTRACE VAGINAL) 0.1 MG/GM vaginal cream at bedtime. Apply a pea-sized amount to urethra at bedtime    [provider]  fenofibrate 160 MG tablet Take 160 mg by mouth at bedtime.     [provider]  folic acid (FOLVITE) 676 MCG tablet Take 400 mcg by mouth daily.    [provider]  HYDROcodone-acetaminophen (NORCO/VICODIN) 5-325 MG tablet Take 1-2 tablets by mouth every 6 (six) hours as needed for moderate pain or severe pain. 04/18/16   Gladstone Lighter, MD  HYDROmorphone (DILAUDID) 2 MG tablet  04/09/16   [provider]  ibandronate (BONIVA) 150 MG tablet Take 150 mg by mouth every 30 (thirty) days. Take in the morning with a full glass of water, on an empty stomach, and do not take anything else by mouth or lie down for the next 30 min.    [provider]  levocetirizine  (XYZAL) 5 MG tablet Take 5 mg by mouth daily as needed.     [provider]  levothyroxine (SYNTHROID, LEVOTHROID) 75 MCG tablet  06/22/16   [provider]  Liniments (BEN GAY EX) Apply 1 application topically 3 (three) times daily.    [provider]  Magnesium 250 MG TABS Take 500 mg by mouth daily.     [provider]  Melatonin 5 MG TABS Take 1 tablet by mouth at bedtime as needed. For sleep    [provider]  Multiple Vitamins-Minerals (MULTIVITAMIN WITH MINERALS) tablet Take 1 tablet by mouth daily.    [provider]  NEOMYCIN-POLYMYXIN-HYDROCORTISONE (CORTISPORIN) 1 % SOLN otic solution Apply  1-2 drops to toe BID after soaking 09/07/16   Hyatt, Max T, DPM  pantoprazole (PROTONIX) 40 MG tablet Take 40 mg by mouth as needed.     [provider]  vitamin C (ASCORBIC ACID) 500 MG tablet Take 500 mg by mouth daily.    [provider]  zinc gluconate 50 MG tablet Take 50 mg by mouth daily.    [provider]    Allergies Codeine; Ultram [tramadol]; Nitrofuran derivatives; Levaquin [levofloxacin in d5w]; Ultracet [tramadol-acetaminophen]; Ciprofloxacin; Nsaids; and Sulfa antibiotics  Family History  Problem Relation Age of Onset  . Pulmonary fibrosis Mother   . Melanoma Father   . Cardiomyopathy Sister        and arrhythmia    Social History Social History  Substance Use Topics  . Smoking status: Former Research scientist (life sciences)  . Smokeless tobacco: Never Used  . Alcohol use Yes     Comment: occasional wine    Review of Systems Constitutional: No fever/chills Eyes: No visual changes. ENT: No sore throat. Cardiovascular: +chest pain. Respiratory: Denies shortness of breath. Gastrointestinal: No abdominal pain.  No nausea, no vomiting.  No diarrhea.  No constipation. Genitourinary: Negative for dysuria. Musculoskeletal: Negative for neck pain.  Negative for back pain. Integumentary: Negative for  rash. Neurological: Brief headache which completely resolved, no focal weakness or numbness.   ____________________________________________   PHYSICAL EXAM:  VITAL SIGNS: ED Triage Vitals  Enc Vitals Group     BP 06/07/17 0048 (!) 140/101     Pulse Rate 06/07/17 0048 (!) 110     Resp 06/07/17 0048 18     Temp 06/07/17 0048 97.6 F (36.4 C)     Temp Source 06/07/17 0048 Oral     SpO2 06/07/17 0048 94 %     Weight 06/07/17 0047 60.8 kg (134 lb)     Height 06/07/17 0047 1.549 m (5\' 1" )     Head Circumference --      Peak Flow --      Pain Score 06/07/17 0047 1     Pain Loc --      Pain Edu? --      Excl. in [place]? --     Constitutional: Alert and oriented. Well appearing and in no acute distress. Eyes: Conjunctivae are normal.  Head: Atraumatic. Nose: No congestion/rhinnorhea. Mouth/Throat: Mucous membranes are moist. Neck: No stridor.  No meningeal signs.   Cardiovascular: Normal rate, regular rhythm. Good peripheral circulation. Grossly normal heart sounds. Respiratory: Normal respiratory effort.  No retractions. Lungs CTAB. Gastrointestinal: Soft and nontender. No distention.  Musculoskeletal: No lower extremity tenderness nor edema. No gross deformities of extremities. Neurologic:  Normal speech and language. No gross focal neurologic deficits are appreciated.  Skin:  Skin is warm, dry and intact. No rash noted. Psychiatric: Mood and affect are normal. Speech and behavior are normal.  ____________________________________________   LABS (all labs ordered are listed, but only abnormal results are displayed)  Labs Reviewed  BASIC METABOLIC PANEL - Abnormal; Notable for the following:       Result Value   Glucose, Bld 125 (*)    BUN 22 (*)    All other components within normal limits  HEPATIC FUNCTION PANEL - Abnormal; Notable for the following:    Bilirubin, Direct <0.1 (*)    All other components within normal limits  CBC  TROPONIN I  LIPASE, BLOOD  TROPONIN I    ____________________________________________  EKG  ED ECG REPORT I, Lashawna Poche, the attending physician,  personally viewed and interpreted this ECG.  Date: 06/07/2017 EKG Time: 00: 47 Rate: 117 Rhythm:  QRS Axis: normal Intervals: normal ST/T Wave abnormalities: Non-specific ST segment / T-wave changes, but no evidence of acute ischemia. Narrative Interpretation: no evidence of acute ischemia   ____________________________________________  RADIOLOGY   Ct Angio Chest Pe W/cm &/or Wo Cm  Result Date: 06/07/2017 CLINICAL DATA:  Chest pain.  High probability of pulmonary embolus. EXAM: CT ANGIOGRAPHY CHEST WITH CONTRAST TECHNIQUE: Multidetector CT imaging of the chest was performed using the standard protocol during bolus administration of intravenous contrast. Multiplanar CT image reconstructions and MIPs were obtained to evaluate the vascular anatomy. CONTRAST:  75 cc Isovue 370 IV COMPARISON:  Radiograph earlier this day.  Chest CT 04/15/2016 FINDINGS: Cardiovascular: There are no filling defects within the pulmonary arteries to suggest pulmonary embolus. No aortic dissection. Mild aortic atherosclerosis of the distal descending aorta. The heart is normal in size. No pericardial effusion. Mediastinum/Nodes: No mediastinal or hilar adenopathy. Tiny hiatal hernia. Right thyroid nodule measuring approximately 13 mm, similar to prior allowing for differences in caliper placement. Lungs/Pleura: Mild biapical pleuroparenchymal scarring. Mild dependent ground-glass opacities in both lungs likely hypoventilatory atelectasis. No confluent consolidation or pleural fluid. No pulmonary edema. No pulmonary mass. Upper Abdomen: Low-density hepatic lesions, as characterized on prior abdominal CT 04/17/2016 as cysts, unchanged. No acute abnormality. Musculoskeletal: Unchanged bone island in the midthoracic spine. There are no acute or suspicious osseous abnormalities. Right axillary lipoma is  unchanged. Review of the MIP images confirms the above findings. IMPRESSION: 1. No pulmonary embolus. 2. Mild hypoventilatory change at the lung bases, otherwise no acute intrathoracic abnormality. 3. Mild aortic atherosclerosis. Aortic Atherosclerosis (ICD10-I70.0). Electronically Signed   By: Jeb Levering M.D.   On: 06/07/2017 02:38   Dg Chest Portable 1 View  Result Date: 06/07/2017 CLINICAL DATA:  Chest pain EXAM: PORTABLE CHEST 1 VIEW COMPARISON:  04/15/2016 FINDINGS: The heart size and mediastinal contours are within normal limits. Both lungs are clear. The visualized skeletal structures are unremarkable. IMPRESSION: No active disease. Electronically Signed   By: Donavan Foil M.D.   On: 06/07/2017 01:09    ____________________________________________   PROCEDURES  Critical Care performed: No   Procedure(s) performed:   Procedures   ____________________________________________   INITIAL IMPRESSION / ASSESSMENT AND PLAN / ED COURSE  As part of my medical decision making, I reviewed the following data within the [place] notes reviewed and incorporated, Labs reviewed , EKG interpreted  and Old chart reviewed    Differential diagnosis includes, but is not limited to, ACS, aortic dissection, pulmonary embolism, cardiac tamponade, pneumothorax, pneumonia, pericarditis/myocarditis, GI-related causes including esophagitis/gastritis, and musculoskeletal chest wall pain.  Patient is at moderate risk based on HEART score, but PE is also at least a moderate risk based on her tachycardia and description of symptoms.  She had a negative chest CTA last year after chest pain and SOB that occurred post-operatively (hysterectomy), but it was negative.    Initial labs reassuring with normal CMP, lipase, CBC, and neg troponin.  Non-acute CXR with normal mediastinum.  EKG unremarkable except for the tachycardia and it sounds as if the patient frequently has  tachycardia.  I will order a CT angio chest to rule out pulmonary embolism and a second troponin.  The patient does not want to stay in the hospital if it is not necessary and our goal at this point is to rule out anything acute/emergent or life-threatening.  She  understands and agrees with the plan.   Clinical Course as of Jun 08 511  Mon Jun 07, 2017  3329 Reassuring CTA chest.  I updated the patient about no evidence of pulmonary embolism and I scheduled the repeat troponin for 3:50 AM.  Patient understands and agrees with the plan.  She wants something to eat and drink which I provided. CT Angio Chest PE W/Cm &/Or Wo Cm [CF]  0509 The patient's second troponin was negative.  She has been eating and drinking without difficulty.  She states that she still occasionally has a little bit of chest pressure but she wants to go home.  I offered admission and explained that she may benefit from additional testing but she wants to go home and is comfortable following up as an outpatient.  I gave her strict return precautions should her symptoms worsen and she agrees to come back.  She will call Dr. Nehemiah Massed in the morning to schedule a close outpatient follow-up visit.  [CF]    Clinical Course User Index [CF] Hinda Kehr, MD    ____________________________________________  FINAL CLINICAL IMPRESSION(S) / ED DIAGNOSES  Final diagnoses:  Chest pain, unspecified type     MEDICATIONS GIVEN DURING THIS VISIT:  Medications  sodium chloride 0.9 % bolus 500 mL (0 mLs Intravenous Stopped 06/07/17 0348)  iopamidol (ISOVUE-370) 76 % injection 75 mL (75 mLs Intravenous Contrast Given 06/07/17 0211)     NEW OUTPATIENT MEDICATIONS STARTED DURING THIS VISIT:  New Prescriptions   No medications on file    Modified Medications   No medications on file    Discontinued Medications   No medications on file     Note:  This document was prepared using Dragon voice recognition software and may  include unintentional dictation errors.    Hinda Kehr, MD 06/07/17 [phone]

## 2017-06-07 NOTE — ED Triage Notes (Signed)
Pt arrived via ems from home with complaints of chest pain and headache. Pt states she had pain/pressure mid chest rating it at a 5 on a 0-10 scale that happened about and hour and a half prior to arrival to the ED. Pt took 1 aspirin prior to calling 911 and was directed by dispatcher to take 3 addition aspirin which pt complied with. Upon arrival pt a&o x 4 and states pain has decreased to a 2. Pt states rest decreased the pain.

## 2017-06-09 DIAGNOSIS — I493 Ventricular premature depolarization: Secondary | ICD-10-CM | POA: Diagnosis not present

## 2017-06-09 DIAGNOSIS — E782 Mixed hyperlipidemia: Secondary | ICD-10-CM | POA: Diagnosis not present

## 2017-06-09 DIAGNOSIS — Z01818 Encounter for other preprocedural examination: Secondary | ICD-10-CM | POA: Diagnosis not present

## 2017-06-09 DIAGNOSIS — I471 Supraventricular tachycardia: Secondary | ICD-10-CM | POA: Diagnosis not present

## 2017-06-09 DIAGNOSIS — I10 Essential (primary) hypertension: Secondary | ICD-10-CM | POA: Diagnosis not present

## 2017-06-09 DIAGNOSIS — R079 Chest pain, unspecified: Secondary | ICD-10-CM | POA: Diagnosis not present

## 2017-06-10 ENCOUNTER — Other Ambulatory Visit: Payer: Self-pay | Admitting: Nurse Practitioner

## 2017-06-10 DIAGNOSIS — R1084 Generalized abdominal pain: Secondary | ICD-10-CM

## 2017-06-10 DIAGNOSIS — R110 Nausea: Secondary | ICD-10-CM

## 2017-06-10 DIAGNOSIS — Z23 Encounter for immunization: Secondary | ICD-10-CM | POA: Diagnosis not present

## 2017-06-15 ENCOUNTER — Ambulatory Visit
Admission: RE | Admit: 2017-06-15 | Discharge: 2017-06-15 | Disposition: A | Discharge status: Home / Self Care | Payer: Medicare Other | Source: Ambulatory Visit | Attending: Nurse Practitioner | Admitting: Nurse Practitioner

## 2017-06-15 DIAGNOSIS — J301 Allergic rhinitis due to pollen: Secondary | ICD-10-CM | POA: Diagnosis not present

## 2017-06-15 DIAGNOSIS — J3089 Other allergic rhinitis: Secondary | ICD-10-CM | POA: Diagnosis not present

## 2017-06-15 DIAGNOSIS — K824 Cholesterolosis of gallbladder: Secondary | ICD-10-CM | POA: Diagnosis not present

## 2017-06-15 DIAGNOSIS — R110 Nausea: Secondary | ICD-10-CM | POA: Insufficient documentation

## 2017-06-15 DIAGNOSIS — R1084 Generalized abdominal pain: Secondary | ICD-10-CM | POA: Diagnosis not present

## 2017-06-16 DIAGNOSIS — I471 Supraventricular tachycardia: Secondary | ICD-10-CM | POA: Diagnosis not present

## 2017-06-28 DIAGNOSIS — L821 Other seborrheic keratosis: Secondary | ICD-10-CM | POA: Diagnosis not present

## 2017-06-28 DIAGNOSIS — R202 Paresthesia of skin: Secondary | ICD-10-CM | POA: Diagnosis not present

## 2017-06-28 DIAGNOSIS — J3089 Other allergic rhinitis: Secondary | ICD-10-CM | POA: Diagnosis not present

## 2017-06-28 DIAGNOSIS — D1721 Benign lipomatous neoplasm of skin and subcutaneous tissue of right arm: Secondary | ICD-10-CM | POA: Diagnosis not present

## 2017-06-28 DIAGNOSIS — L298 Other pruritus: Secondary | ICD-10-CM | POA: Diagnosis not present

## 2017-06-28 DIAGNOSIS — J301 Allergic rhinitis due to pollen: Secondary | ICD-10-CM | POA: Diagnosis not present

## 2017-06-28 DIAGNOSIS — L820 Inflamed seborrheic keratosis: Secondary | ICD-10-CM | POA: Diagnosis not present

## 2017-06-28 DIAGNOSIS — L570 Actinic keratosis: Secondary | ICD-10-CM | POA: Diagnosis not present

## 2017-06-28 DIAGNOSIS — X32XXXA Exposure to sunlight, initial encounter: Secondary | ICD-10-CM | POA: Diagnosis not present

## 2017-06-28 DIAGNOSIS — L538 Other specified erythematous conditions: Secondary | ICD-10-CM | POA: Diagnosis not present

## 2017-06-28 DIAGNOSIS — D1722 Benign lipomatous neoplasm of skin and subcutaneous tissue of left arm: Secondary | ICD-10-CM | POA: Diagnosis not present

## 2017-06-28 DIAGNOSIS — R079 Chest pain, unspecified: Secondary | ICD-10-CM | POA: Diagnosis not present

## 2017-07-05 DIAGNOSIS — E782 Mixed hyperlipidemia: Secondary | ICD-10-CM | POA: Diagnosis not present

## 2017-07-05 DIAGNOSIS — R002 Palpitations: Secondary | ICD-10-CM | POA: Insufficient documentation

## 2017-07-05 DIAGNOSIS — I10 Essential (primary) hypertension: Secondary | ICD-10-CM | POA: Diagnosis not present

## 2017-07-05 DIAGNOSIS — R001 Bradycardia, unspecified: Secondary | ICD-10-CM | POA: Diagnosis not present

## 2017-07-12 DIAGNOSIS — E559 Vitamin D deficiency, unspecified: Secondary | ICD-10-CM | POA: Diagnosis not present

## 2017-07-12 DIAGNOSIS — J3089 Other allergic rhinitis: Secondary | ICD-10-CM | POA: Diagnosis not present

## 2017-07-12 DIAGNOSIS — Z0001 Encounter for general adult medical examination with abnormal findings: Secondary | ICD-10-CM | POA: Diagnosis not present

## 2017-07-12 DIAGNOSIS — J301 Allergic rhinitis due to pollen: Secondary | ICD-10-CM | POA: Diagnosis not present

## 2017-07-12 DIAGNOSIS — R5383 Other fatigue: Secondary | ICD-10-CM | POA: Diagnosis not present

## 2017-07-12 DIAGNOSIS — E782 Mixed hyperlipidemia: Secondary | ICD-10-CM | POA: Diagnosis not present

## 2017-07-12 DIAGNOSIS — I10 Essential (primary) hypertension: Secondary | ICD-10-CM | POA: Diagnosis not present

## 2017-07-19 DIAGNOSIS — J301 Allergic rhinitis due to pollen: Secondary | ICD-10-CM | POA: Diagnosis not present

## 2017-07-19 DIAGNOSIS — J3089 Other allergic rhinitis: Secondary | ICD-10-CM | POA: Diagnosis not present

## 2017-07-26 ENCOUNTER — Ambulatory Visit (INDEPENDENT_AMBULATORY_CARE_PROVIDER_SITE_OTHER): Payer: Medicare Other

## 2017-07-26 DIAGNOSIS — J3089 Other allergic rhinitis: Secondary | ICD-10-CM | POA: Diagnosis not present

## 2017-08-10 DIAGNOSIS — R011 Cardiac murmur, unspecified: Secondary | ICD-10-CM

## 2017-08-10 HISTORY — DX: Cardiac murmur, unspecified: R01.1

## 2017-08-12 ENCOUNTER — Ambulatory Visit: Payer: Self-pay

## 2017-08-20 ENCOUNTER — Encounter: Payer: Self-pay | Admitting: Student

## 2017-08-20 ENCOUNTER — Ambulatory Visit (INDEPENDENT_AMBULATORY_CARE_PROVIDER_SITE_OTHER): Payer: Medicare Other

## 2017-08-20 DIAGNOSIS — J309 Allergic rhinitis, unspecified: Secondary | ICD-10-CM

## 2017-08-23 ENCOUNTER — Encounter
Admission: RE | Disposition: A | Discharge status: Home / Self Care | Payer: Self-pay | Source: Ambulatory Visit | Attending: Unknown Physician Specialty

## 2017-08-23 ENCOUNTER — Ambulatory Visit: Payer: Medicare Other | Admitting: Anesthesiology

## 2017-08-23 ENCOUNTER — Encounter: Payer: Self-pay | Admitting: Anesthesiology

## 2017-08-23 ENCOUNTER — Ambulatory Visit
Admission: RE | Admit: 2017-08-23 | Discharge: 2017-08-23 | Disposition: A | Discharge status: Home / Self Care | Payer: Medicare Other | Source: Ambulatory Visit | Attending: Unknown Physician Specialty | Admitting: Unknown Physician Specialty

## 2017-08-23 DIAGNOSIS — K2950 Unspecified chronic gastritis without bleeding: Secondary | ICD-10-CM | POA: Diagnosis not present

## 2017-08-23 DIAGNOSIS — E785 Hyperlipidemia, unspecified: Secondary | ICD-10-CM | POA: Diagnosis not present

## 2017-08-23 DIAGNOSIS — Z538 Procedure and treatment not carried out for other reasons: Secondary | ICD-10-CM | POA: Diagnosis not present

## 2017-08-23 DIAGNOSIS — J45909 Unspecified asthma, uncomplicated: Secondary | ICD-10-CM | POA: Insufficient documentation

## 2017-08-23 DIAGNOSIS — I10 Essential (primary) hypertension: Secondary | ICD-10-CM | POA: Insufficient documentation

## 2017-08-23 DIAGNOSIS — F419 Anxiety disorder, unspecified: Secondary | ICD-10-CM | POA: Diagnosis not present

## 2017-08-23 DIAGNOSIS — L538 Other specified erythematous conditions: Secondary | ICD-10-CM | POA: Diagnosis not present

## 2017-08-23 DIAGNOSIS — Z86010 Personal history of colonic polyps: Secondary | ICD-10-CM | POA: Diagnosis not present

## 2017-08-23 DIAGNOSIS — K222 Esophageal obstruction: Secondary | ICD-10-CM | POA: Diagnosis not present

## 2017-08-23 DIAGNOSIS — Z87891 Personal history of nicotine dependence: Secondary | ICD-10-CM | POA: Diagnosis not present

## 2017-08-23 DIAGNOSIS — K640 First degree hemorrhoids: Secondary | ICD-10-CM | POA: Insufficient documentation

## 2017-08-23 DIAGNOSIS — Z79899 Other long term (current) drug therapy: Secondary | ICD-10-CM | POA: Insufficient documentation

## 2017-08-23 DIAGNOSIS — F418 Other specified anxiety disorders: Secondary | ICD-10-CM | POA: Diagnosis not present

## 2017-08-23 DIAGNOSIS — K219 Gastro-esophageal reflux disease without esophagitis: Secondary | ICD-10-CM | POA: Insufficient documentation

## 2017-08-23 DIAGNOSIS — R001 Bradycardia, unspecified: Secondary | ICD-10-CM | POA: Diagnosis not present

## 2017-08-23 DIAGNOSIS — K635 Polyp of colon: Secondary | ICD-10-CM | POA: Diagnosis not present

## 2017-08-23 DIAGNOSIS — K2960 Other gastritis without bleeding: Secondary | ICD-10-CM | POA: Diagnosis not present

## 2017-08-23 DIAGNOSIS — F329 Major depressive disorder, single episode, unspecified: Secondary | ICD-10-CM | POA: Diagnosis not present

## 2017-08-23 DIAGNOSIS — R1310 Dysphagia, unspecified: Secondary | ICD-10-CM | POA: Diagnosis not present

## 2017-08-23 HISTORY — PX: ESOPHAGOGASTRODUODENOSCOPY (EGD) WITH PROPOFOL: SHX5813

## 2017-08-23 HISTORY — PX: COLONOSCOPY WITH PROPOFOL: SHX5780

## 2017-08-23 SURGERY — ESOPHAGOGASTRODUODENOSCOPY (EGD) WITH PROPOFOL
Anesthesia: General

## 2017-08-23 MED ORDER — FENTANYL CITRATE (PF) 100 MCG/2ML IJ SOLN
INTRAMUSCULAR | Status: AC
  Filled 2017-08-23: qty 2

## 2017-08-23 MED ORDER — SODIUM CHLORIDE 0.9 % IV SOLN: INTRAVENOUS | Status: DC

## 2017-08-23 MED ORDER — EPHEDRINE SULFATE 50 MG/ML IJ SOLN
INTRAMUSCULAR | Status: AC
  Filled 2017-08-23: qty 1

## 2017-08-23 MED ORDER — MIDAZOLAM HCL 2 MG/2ML IJ SOLN
INTRAMUSCULAR | Status: AC
  Filled 2017-08-23: qty 2

## 2017-08-23 MED ORDER — PROPOFOL 500 MG/50ML IV EMUL
INTRAVENOUS | Status: AC
  Filled 2017-08-23: qty 50

## 2017-08-23 MED ORDER — PROPOFOL 500 MG/50ML IV EMUL
INTRAVENOUS | Status: AC
Start: 2017-08-23 — End: 2017-08-23
  Filled 2017-08-23: qty 50

## 2017-08-23 MED ORDER — GLYCOPYRROLATE 0.2 MG/ML IJ SOLN
INTRAMUSCULAR | Status: DC | PRN
  Administered 2017-08-23 (×2): 0.1 mg via INTRAVENOUS

## 2017-08-23 MED ORDER — LIDOCAINE HCL (CARDIAC) 20 MG/ML IV SOLN
INTRAVENOUS | Status: DC | PRN
  Administered 2017-08-23: 30 mg via INTRAVENOUS

## 2017-08-23 MED ORDER — FENTANYL CITRATE (PF) 100 MCG/2ML IJ SOLN
INTRAMUSCULAR | Status: DC | PRN
  Administered 2017-08-23: 50 ug via INTRAVENOUS

## 2017-08-23 MED ORDER — LIDOCAINE HCL (PF) 2 % IJ SOLN
INTRAMUSCULAR | Status: AC
  Filled 2017-08-23: qty 10

## 2017-08-23 MED ORDER — BUTAMBEN-TETRACAINE-BENZOCAINE 2-2-14 % EX AERO
INHALATION_SPRAY | CUTANEOUS | Status: DC | PRN
  Administered 2017-08-23: 2 via TOPICAL

## 2017-08-23 MED ORDER — MIDAZOLAM HCL 2 MG/2ML IJ SOLN
INTRAMUSCULAR | Status: DC | PRN
  Administered 2017-08-23: 2 mg via INTRAVENOUS

## 2017-08-23 MED ORDER — PROPOFOL 500 MG/50ML IV EMUL
INTRAVENOUS | Status: DC | PRN
  Administered 2017-08-23: 100 ug/kg/min via INTRAVENOUS

## 2017-08-23 MED ORDER — SODIUM CHLORIDE 0.9 % IV SOLN
INTRAVENOUS | Status: DC
  Administered 2017-08-23: 10:00:00 via INTRAVENOUS

## 2017-08-23 MED ORDER — EPHEDRINE SULFATE 50 MG/ML IJ SOLN
INTRAMUSCULAR | Status: DC | PRN
  Administered 2017-08-23 (×2): 5 mg via INTRAVENOUS
  Administered 2017-08-23: 10 mg via INTRAVENOUS
  Administered 2017-08-23: 5 mg via INTRAVENOUS

## 2017-08-23 NOTE — Anesthesia Post-op Follow-up Note (Signed)
Anesthesia QCDR form completed.        

## 2017-08-23 NOTE — Transfer of Care (Signed)
Immediate Anesthesia Transfer of Care Note  Patient: Vanessa Evans  Procedure(s) Performed: ESOPHAGOGASTRODUODENOSCOPY (EGD) WITH PROPOFOL (N/A ) COLONOSCOPY WITH PROPOFOL (N/A )  Patient Location: PACU  Anesthesia Type:General  Level of Consciousness: awake and sedated  Airway & Oxygen Therapy: Patient Spontanous Breathing and Patient connected to nasal cannula oxygen  Post-op Assessment: Report given to RN and Post -op Vital signs reviewed and stable  Post vital signs: Reviewed and stable  Last Vitals:  Vitals:   08/23/17 0950  BP: (!) 128/52  Pulse: 85  Resp: 14  Temp: (!) 36.1 C  SpO2: 100%    Last Pain:  Vitals:   08/23/17 0950  PainSc: 0-No pain         Complications: No apparent anesthesia complications

## 2017-08-23 NOTE — Op Note (Addendum)
[facility] Gastroenterology Patient Name: Vanessa Evans Procedure Date: 08/23/2017 10:30 AM MRN: [id number] Account #: [id number] Date of Birth: 06-13-1946 Admit Type: Outpatient Age: 72 Room: [facility] ENDO ROOM 3 Gender: Female Note Status: Finalized Procedure:            Colonoscopy Providers:            Manya Silvas, MD Referring MD:         Lavera Guise, MD (Referring MD) Complications:        Bradycardia Procedure:            Pre-Anesthesia Assessment:                       - After reviewing the risks and benefits, the patient                        was deemed in satisfactory condition to undergo the                        procedure.                       After obtaining informed consent, the colonoscope was                        passed under direct vision. Throughout the procedure,                        the patient's blood pressure, pulse, and oxygen                        saturations were monitored continuously. The                        Colonoscope was introduced through the anus with the                        intention of advancing to the cecum. The scope was                        advanced to the descending colon before the procedure                        was aborted. Medications were given. The colonoscopy                        was technically difficult and complex due to restricted                        mobility of the colon, significant looping and a                        tortuous colon. Findings:      The colonoscopy had to be aborted due to fact that when ever I tried to       pass around a curve she would drop her heart rate down into the 30's       from a beginning rate of 80, even after giving her Robinul 2 doses of       0.1mg  each dose she still would drop her heart even after ephedrine 25mg   in divided doses she still would drop her heart rate.      She was cleaned out well and a good prep but I could not pass beyond   descending colon due to bradycardia as covered above. Procedure aborted       and situation explained to patient after she awoke.      Internal hemorrhoids were found during endoscopy. The hemorrhoids were       small and Grade I (internal hemorrhoids that do not prolapse). Impression:           - No specimens collected. Recommendation:       - The findings and recommendations were discussed with                        the patient's family. Manya Silvas, MD 08/23/2017 11:30:14 AM This report has been signed electronically. Number of Addenda: 0 Note Initiated On: 08/23/2017 10:30 AM Total Procedure Duration: 0 hours 28 minutes 10 seconds       [facility]

## 2017-08-23 NOTE — Anesthesia Procedure Notes (Signed)
Performed by: Cook-Martin, Masie Bermingham Pre-anesthesia Checklist: Patient identified, Emergency Drugs available, Suction available, Patient being monitored and Timeout performed Patient Re-evaluated:Patient Re-evaluated prior to induction Oxygen Delivery Method: Nasal cannula Preoxygenation: Pre-oxygenation with 100% oxygen Induction Type: IV induction Airway Equipment and Method: Bite block Placement Confirmation: CO2 detector and positive ETCO2       

## 2017-08-23 NOTE — Anesthesia Preprocedure Evaluation (Signed)
Anesthesia Evaluation  Patient identified by MRN, date of birth, ID band Patient awake    Reviewed: Allergy & Precautions, NPO status , Patient's Chart, lab work & pertinent test results  History of Anesthesia Complications (+) PONV, DIFFICULT AIRWAY and history of anesthetic complications (pt states she has a small airway)  Airway Mallampati: II       Dental   Pulmonary asthma , former smoker,           Cardiovascular hypertension, Pt. on medications (-) Past MI and (-) CHF + dysrhythmias (tachycardia) + Valvular Problems/Murmurs (mild AS, no tx) AS      Neuro/Psych Anxiety Depression    GI/Hepatic Neg liver ROS, GERD  Medicated and Controlled,  Endo/Other  neg diabetes  Renal/GU negative Renal ROS     Musculoskeletal   Abdominal   Peds  Hematology   Anesthesia Other Findings   Reproductive/Obstetrics                             Anesthesia Physical Anesthesia Plan  ASA: III  Anesthesia Plan: General   Post-op Pain Management:    Induction: Intravenous  PONV Risk Score and Plan: 3 and TIVA, Propofol infusion, Treatment may vary due to age or medical condition and Ondansetron  Airway Management Planned: Nasal Cannula  Additional Equipment:   Intra-op Plan:   Post-operative Plan:   Informed Consent: I have reviewed the patients History and Physical, chart, labs and discussed the procedure including the risks, benefits and alternatives for the proposed anesthesia with the patient or authorized representative who has indicated his/her understanding and acceptance.     Plan Discussed with:   Anesthesia Plan Comments:         Anesthesia Quick Evaluation

## 2017-08-23 NOTE — Op Note (Signed)
[facility] Gastroenterology Patient Name: Vanessa Evans Procedure Date: 08/23/2017 10:30 AM MRN: [id number] Account #: [id number] Date of Birth: [birth year] Admit Type: Inpatient Age: 72 Room: [facility] ENDO ROOM 3 Gender: Female Note Status: Finalized Procedure:            Upper GI endoscopy Indications:          Dysphagia Providers:            Manya Silvas, MD Referring MD:         Lavera Guise, MD (Referring MD) Medicines:            Propofol per Anesthesia Complications:        No immediate complications. Procedure:            Pre-Anesthesia Assessment:                       - After reviewing the risks and benefits, the patient                        was deemed in satisfactory condition to undergo the                        procedure.                       After obtaining informed consent, the endoscope was                        passed under direct vision. Throughout the procedure,                        the patient's blood pressure, pulse, and oxygen                        saturations were monitored continuously. The Endoscope                        was introduced through the mouth, and advanced to the                        second part of duodenum. The upper GI endoscopy was                        accomplished without difficulty. The patient tolerated                        the procedure well. Findings:      A mild Schatzki ring (acquired) was found at the gastroesophageal       junction. A guidewire was placed and the scope was withdrawn. Dilation       was performed with a Savary dilator with mild resistance at 16 mm.      Patchy mild inflammation characterized by erythema was found in the       gastric antrum. Biopsies were taken with a cold forceps for histology.       Biopsies were taken with a cold forceps for Helicobacter pylori testing.      The duodenal bulb and second portion of the duodenum were normal. Impression:           - Mild Schatzki  ring. Dilated.                       -  Gastritis. Biopsied.                       - Normal duodenal bulb and second portion of the                        duodenum. Recommendation:       - Await pathology results.                       - Perform a colonoscopy as previously scheduled. Manya Silvas, MD 08/23/2017 10:45:12 AM This report has been signed electronically. Number of Addenda: 0 Note Initiated On: 08/23/2017 10:30 AM      [facility]

## 2017-08-23 NOTE — Anesthesia Postprocedure Evaluation (Signed)
Anesthesia Post Note  Patient: Vanessa Evans  Procedure(s) Performed: ESOPHAGOGASTRODUODENOSCOPY (EGD) WITH PROPOFOL (N/A ) COLONOSCOPY WITH PROPOFOL (N/A )  Patient location during evaluation: Endoscopy Anesthesia Type: General Level of consciousness: awake and alert Pain management: pain level controlled Vital Signs Assessment: post-procedure vital signs reviewed and stable Respiratory status: spontaneous breathing and respiratory function stable Cardiovascular status: stable Anesthetic complications: no     Last Vitals:  Vitals:   08/23/17 1149 08/23/17 1159  BP: 116/63 119/72  Pulse: 79 82  Resp: 13 15  Temp:    SpO2: 99% (!) 89%    Last Pain:  Vitals:   08/23/17 1129  TempSrc: Tympanic  PainSc:                  [name]

## 2017-08-23 NOTE — H&P (Signed)
Primary Care Physician:  Lavera Guise, MD Primary Gastroenterologist:  [name]  Pre-Procedure History & Physical: HPI:  Vanessa Evans is a 72 y.o. female is here for an endoscopy and colonoscopy.   Past Medical History:  Diagnosis Date  . Anxiety   . Arthritis    osteo  . Asthma    needs rescue inhaler few times a year  . Depression   . Dizziness   . Dysrhythmia    tachycardia  . GERD (gastroesophageal reflux disease)   . Headache(784.0)   . Heart murmur    aortic  . Hyperlipidemia   . Hypertension   . Migraine   . Orthopnea   . Pneumonia    hx  . PONV (postoperative nausea and vomiting)    very anxious during cataract surgery  . Shortness of breath    any exertion, cannot lie flat    Past Surgical History:  Procedure Laterality Date  . ABDOMINAL HYSTERECTOMY    . BACK SURGERY    . BREAST BIOPSY Right    benign  . BREAST SURGERY Right    lumpectomy  . CATARACT EXTRACTION W/PHACO Left 12/25/2014   Procedure: CATARACT EXTRACTION PHACO AND INTRAOCULAR LENS PLACEMENT (IOC);  Surgeon: Birder Robson, MD;  Location: [facility];  Service: Ophthalmology;  Laterality: Left;  [place] 00:32 AP% 20.0 CDE 6.49  . COLONOSCOPY W/ BIOPSIES    . CYSTOCELE REPAIR N/A 04/09/2016   Procedure: ANTERIOR REPAIR (CYSTOCELE);  Surgeon: Gae Dry, MD;  Location: [facility];  Service: Gynecology;  Laterality: N/A;  . DIAGNOSTIC LAPAROSCOPY    . DILATION AND CURETTAGE OF UTERUS    . EYE SURGERY Bilateral   . HERNIA REPAIR Right 2008   Inguinal hernia Repair  . JOINT REPLACEMENT Right    knee  . KNEE ARTHROSCOPY Right   . LUMBAR LAMINECTOMY/DECOMPRESSION MICRODISCECTOMY Right 03/21/2013   Procedure: Right Lumbar five-Sacral one Laminectomy for Synovial Cyst;  Surgeon: Faythe Ghee, MD;  Location: [facility];  Service: Neurosurgery;  Laterality: Right;  right  . OOPHORECTOMY    . TUBAL LIGATION    . UNILATERAL SALPINGECTOMY Left 04/09/2016   Procedure: UNILATERAL  SALPINGECTOMY;  Surgeon: Gae Dry, MD;  Location: [facility];  Service: Gynecology;  Laterality: Left;  Marland Kitchen VAGINAL HYSTERECTOMY N/A 04/09/2016   Procedure: HYSTERECTOMY VAGINAL;  Surgeon: Gae Dry, MD;  Location: [facility];  Service: Gynecology;  Laterality: N/A;    Prior to Admission medications   Medication Sig Start Date End Date Taking? Authorizing Provider  acetaminophen (TYLENOL) 500 MG tablet Take 1,000 mg by mouth every 6 (six) hours as needed.   Yes [provider]  albuterol (PROVENTIL HFA;VENTOLIN HFA) 108 (90 BASE) MCG/ACT inhaler Inhale 2 puffs into the lungs every 6 (six) hours as needed for wheezing.   Yes [provider]  aspirin EC 81 MG tablet Take 81 mg by mouth daily.   Yes [provider]  Calcium Carbonate-Vitamin D (CALCIUM 600 + D PO) Take 1 tablet by mouth 2 (two) times daily.    Yes [provider]  cholecalciferol (VITAMIN D) 1000 UNITS tablet Take 1,000 Units by mouth daily.   Yes [provider]  citalopram (CELEXA) 10 MG tablet Take 5 mg by mouth as needed.    Yes [provider]  cyanocobalamin 500 MCG tablet Take 500 mcg by mouth daily.   Yes [provider]  diclofenac sodium (VOLTAREN) 1 % GEL Apply topically 3 (three) times  daily as needed.   Yes [provider]  diltiazem (CARDIZEM CD) 120 MG 24 hr capsule  05/18/16  Yes [provider]  docusate sodium (COLACE) 100 MG capsule Take 100-300 mg by mouth at bedtime as needed for constipation.   Yes [provider]  estradiol (ESTRACE VAGINAL) 0.1 MG/GM vaginal cream at bedtime. Apply a pea-sized amount to urethra at bedtime   Yes [provider]  fenofibrate 160 MG tablet Take 160 mg by mouth at bedtime.    Yes [provider]  folic acid (FOLVITE) 542 MCG tablet Take 400 mcg by mouth daily.   Yes [provider]  ibandronate (BONIVA) 150 MG tablet Take 150 mg by mouth every 30 (thirty)  days. Take in the morning with a full glass of water, on an empty stomach, and do not take anything else by mouth or lie down for the next 30 min.   Yes [provider]  levocetirizine (XYZAL) 5 MG tablet Take 5 mg by mouth daily as needed.    Yes [provider]  levothyroxine (SYNTHROID, LEVOTHROID) 75 MCG tablet  06/22/16  Yes [provider]  Magnesium 250 MG TABS Take 500 mg by mouth daily.    Yes [provider]  Melatonin 5 MG TABS Take 1 tablet by mouth at bedtime as needed. For sleep   Yes [provider]  Multiple Vitamins-Minerals (MULTIVITAMIN WITH MINERALS) tablet Take 1 tablet by mouth daily.   Yes [provider]  pantoprazole (PROTONIX) 40 MG tablet Take 40 mg by mouth as needed.    Yes [provider]  PEG 3350-KCl-NaBcb-NaCl-NaSulf (PEG-3350/ELECTROLYTES) 236 g SOLR Take by mouth.   Yes [provider]  pyridoxine (B-6) 100 MG tablet Take 100 mg by mouth daily.   Yes [provider]  vitamin C (ASCORBIC ACID) 500 MG tablet Take 500 mg by mouth daily.   Yes [provider]  zinc gluconate 50 MG tablet Take 50 mg by mouth daily.   Yes [provider]  cyclobenzaprine (FLEXERIL) 10 MG tablet Take 5 mg by mouth 2 (two) times daily as needed for muscle spasms.     [provider]  DiphenhydrAMINE HCl (BENADRYL ALLERGY PO) Take 2 tablets by mouth at bedtime as needed. For allergies    [provider]  doxycycline (VIBRA-TABS) 100 MG tablet Take 1 tablet (100 mg total) by mouth 2 (two) times daily. Patient not taking: Reported on 08/23/2017 10/28/16   Hyatt, Max T, DPM  EPINEPHrine (EPIPEN IJ) Inject as directed.    [provider]  HYDROcodone-acetaminophen (NORCO/VICODIN) 5-325 MG tablet Take 1-2 tablets by mouth every 6 (six) hours as needed for moderate pain or severe pain. Patient not taking: Reported on 08/23/2017 04/18/16   Gladstone Lighter, MD  HYDROmorphone  (DILAUDID) 2 MG tablet  04/09/16   [provider]  Liniments (BEN GAY EX) Apply 1 application topically 3 (three) times daily.    [provider]  NEOMYCIN-POLYMYXIN-HYDROCORTISONE (CORTISPORIN) 1 % SOLN otic solution Apply 1-2 drops to toe BID after soaking Patient not taking: Reported on 08/23/2017 09/07/16   Tyson Dense T, DPM    Allergies as of 08/19/2017 - Review Complete 06/07/2017  Allergen Reaction Noted  . Codeine Shortness Of Breath and Itching 03/07/2013  . Ultram [tramadol] Itching and Other (See Comments) 03/07/2013  . Nitrofuran derivatives Diarrhea 03/07/2013  . Levaquin [levofloxacin in d5w] Other (See Comments) 03/07/2013  . Ultracet [tramadol-acetaminophen] Itching 03/07/2013  . Ciprofloxacin Other (  See Comments) 03/07/2013  . Nsaids Other (See Comments) 03/07/2013  . Sulfa antibiotics Itching, Rash, and Other (See Comments) 03/07/2013    Family History  Problem Relation Age of Onset  . Pulmonary fibrosis Mother   . Melanoma Father   . Cardiomyopathy Sister        and arrhythmia    Social History   Socioeconomic History  . Marital status: Married    Spouse name: Not on file  . Number of children: Not on file  . Years of education: Not on file  . Highest education level: Not on file  Social Needs  . Financial resource strain: Not on file  . Food insecurity - worry: Not on file  . Food insecurity - inability: Not on file  . Transportation needs - medical: Not on file  . Transportation needs - non-medical: Not on file  Occupational History  . Not on file  Tobacco Use  . Smoking status: Former Research scientist (life sciences)  . Smokeless tobacco: Never Used  Substance and Sexual Activity  . Alcohol use: Yes    Comment: occasional wine  . Drug use: No  . Sexual activity: Not on file  Other Topics Concern  . Not on file  Social History Narrative  . Not on file    Review of Systems: See HPI, otherwise negative ROS  Physical Exam: BP (!) 128/52 (BP  Location: Right Arm)   Pulse 85   Temp (!) 97 F (36.1 C)   Resp 14   SpO2 100%  General:   Alert,  pleasant and cooperative in NAD Head:  Normocephalic and atraumatic. Neck:  Supple; no masses or thyromegaly. Lungs:  Clear throughout to auscultation.    Heart:  Regular rate and rhythm. Abdomen:  Soft, nontender and nondistended. Normal bowel sounds, without guarding, and without rebound.   Neurologic:  Alert and  oriented x4;  grossly normal neurologically.  Impression/Plan: PETRONA WYETH is here for an endoscopy and colonoscopy to be performed for dysphagia, and personal history of colon polyps.  Risks, benefits, limitations, and alternatives regarding  endoscopy and colonoscopy have been reviewed with the patient.  Questions have been answered.  All parties agreeable.   Gaylyn Cheers, MD  08/23/2017, 10:23 AM

## 2017-08-24 ENCOUNTER — Encounter: Payer: Self-pay | Admitting: Unknown Physician Specialty

## 2017-08-24 LAB — SURGICAL PATHOLOGY

## 2017-08-27 ENCOUNTER — Other Ambulatory Visit: Payer: Self-pay | Admitting: Internal Medicine

## 2017-08-27 DIAGNOSIS — I471 Supraventricular tachycardia: Secondary | ICD-10-CM | POA: Diagnosis not present

## 2017-08-27 DIAGNOSIS — E782 Mixed hyperlipidemia: Secondary | ICD-10-CM | POA: Diagnosis not present

## 2017-08-27 DIAGNOSIS — I10 Essential (primary) hypertension: Secondary | ICD-10-CM | POA: Diagnosis not present

## 2017-08-27 DIAGNOSIS — R001 Bradycardia, unspecified: Secondary | ICD-10-CM | POA: Diagnosis not present

## 2017-09-01 DIAGNOSIS — K2970 Gastritis, unspecified, without bleeding: Secondary | ICD-10-CM | POA: Diagnosis not present

## 2017-09-01 DIAGNOSIS — Z9889 Other specified postprocedural states: Secondary | ICD-10-CM | POA: Diagnosis not present

## 2017-09-06 ENCOUNTER — Ambulatory Visit (INDEPENDENT_AMBULATORY_CARE_PROVIDER_SITE_OTHER): Payer: Medicare Other

## 2017-09-06 DIAGNOSIS — J309 Allergic rhinitis, unspecified: Secondary | ICD-10-CM | POA: Diagnosis not present

## 2017-09-13 ENCOUNTER — Other Ambulatory Visit: Payer: Self-pay | Admitting: Internal Medicine

## 2017-09-16 ENCOUNTER — Telehealth: Payer: Self-pay

## 2017-09-16 ENCOUNTER — Other Ambulatory Visit: Payer: Self-pay | Admitting: Nurse Practitioner

## 2017-09-16 ENCOUNTER — Other Ambulatory Visit: Payer: Self-pay

## 2017-09-16 DIAGNOSIS — J0141 Acute recurrent pansinusitis: Secondary | ICD-10-CM

## 2017-09-16 MED ORDER — PREDNISONE 5 MG (48) PO TBPK: ORAL_TABLET | ORAL | 0 refills | Status: DC

## 2017-09-16 NOTE — Progress Notes (Signed)
Sent in 12 day taper for prednisone to [facility]

## 2017-09-16 NOTE — Telephone Encounter (Signed)
-----   Message from [facility] sent at 09/16/2017  9:43 AM EST ----- Regarding: [place]: [phone] Pt has an appt  Monday 09-20-17. She is feeling like she needs 12 days of Prednisone to help clear up her sinus issues. Pt states that she has this issue frequently and that 12 days of Prednisone clears this up for her. Her pharmacy is [facility].

## 2017-09-16 NOTE — Telephone Encounter (Signed)
Pt advised that we can wait unyil your appt/np

## 2017-09-20 ENCOUNTER — Ambulatory Visit (INDEPENDENT_AMBULATORY_CARE_PROVIDER_SITE_OTHER): Payer: Medicare Other | Admitting: Nurse Practitioner

## 2017-09-20 ENCOUNTER — Ambulatory Visit: Payer: Medicare Other

## 2017-09-20 ENCOUNTER — Encounter: Payer: Self-pay | Admitting: Nurse Practitioner

## 2017-09-20 VITALS — BP 159/79 | HR 70 | Temp 96.0°F | Resp 16 | Ht 61.0 in | Wt 135.2 lb

## 2017-09-20 DIAGNOSIS — M62838 Other muscle spasm: Secondary | ICD-10-CM

## 2017-09-20 DIAGNOSIS — E2839 Other primary ovarian failure: Secondary | ICD-10-CM | POA: Diagnosis not present

## 2017-09-20 DIAGNOSIS — J0141 Acute recurrent pansinusitis: Secondary | ICD-10-CM | POA: Diagnosis not present

## 2017-09-20 DIAGNOSIS — R319 Hematuria, unspecified: Secondary | ICD-10-CM | POA: Insufficient documentation

## 2017-09-20 DIAGNOSIS — R300 Dysuria: Secondary | ICD-10-CM | POA: Diagnosis not present

## 2017-09-20 DIAGNOSIS — R062 Wheezing: Secondary | ICD-10-CM

## 2017-09-20 DIAGNOSIS — Z0001 Encounter for general adult medical examination with abnormal findings: Secondary | ICD-10-CM

## 2017-09-20 DIAGNOSIS — L309 Dermatitis, unspecified: Secondary | ICD-10-CM | POA: Insufficient documentation

## 2017-09-20 DIAGNOSIS — E782 Mixed hyperlipidemia: Secondary | ICD-10-CM | POA: Insufficient documentation

## 2017-09-20 DIAGNOSIS — M810 Age-related osteoporosis without current pathological fracture: Secondary | ICD-10-CM | POA: Insufficient documentation

## 2017-09-20 DIAGNOSIS — M1990 Unspecified osteoarthritis, unspecified site: Secondary | ICD-10-CM | POA: Insufficient documentation

## 2017-09-20 DIAGNOSIS — J309 Allergic rhinitis, unspecified: Secondary | ICD-10-CM | POA: Insufficient documentation

## 2017-09-20 MED ORDER — CYCLOBENZAPRINE HCL 10 MG PO TABS: 5.0000 mg | ORAL_TABLET | Freq: Two times a day (BID) | ORAL | 2 refills | Status: DC | PRN

## 2017-09-20 MED ORDER — ALBUTEROL SULFATE (2.5 MG/3ML) 0.083% IN NEBU: 2.5000 mg | INHALATION_SOLUTION | Freq: Four times a day (QID) | RESPIRATORY_TRACT | 12 refills | Status: DC | PRN

## 2017-09-20 MED ORDER — PREDNISONE 10 MG (48) PO TBPK: ORAL_TABLET | ORAL | 0 refills | Status: DC

## 2017-09-20 MED ORDER — AMOXICILLIN-POT CLAVULANATE 875-125 MG PO TABS: 1.0000 | ORAL_TABLET | Freq: Two times a day (BID) | ORAL | 0 refills | Status: DC

## 2017-09-20 NOTE — Progress Notes (Signed)
[facility] [place], [place] [address]  Internal MEDICINE  Office Visit Note  Patient Name: Vanessa Evans  [id number]  [id number]  Date of Service: 09/21/2017  Chief Complaint  Patient presents with  . Annual Exam  . URI     The patient is here for health maintenance exam. She has had upper respiratory infection with cough and fever, off and on, for a few weeks. She is fatigued. Did start 5mg  dose pack of prednisone for 12 days on Saturday. She generally needs 10mg  dose pack.    URI   This is a new problem. The current episode started 1 to 4 weeks ago. The problem has been waxing and waning. There has been no fever. Associated symptoms include congestion, coughing, ear pain, headaches, rhinorrhea, sinus pain, a sore throat and wheezing. Pertinent negatives include no chest pain, diarrhea, nausea or vomiting. She has tried decongestant and antihistamine for the symptoms. The treatment provided mild relief.   Pt is here for routine health maintenance examination  Current Medication: Outpatient Encounter Medications as of 09/20/2017  Medication Sig Note  . acetaminophen (TYLENOL) 500 MG tablet Take 1,000 mg by mouth every 6 (six) hours as needed.   Marland Kitchen albuterol (PROVENTIL HFA;VENTOLIN HFA) 108 (90 BASE) MCG/ACT inhaler Inhale 2 puffs into the lungs every 6 (six) hours as needed for wheezing.   Marland Kitchen aspirin EC 81 MG tablet Take 81 mg by mouth daily.   . benzonatate (TESSALON) 100 MG capsule TAKE ONE TO TWO CAPSULES TWICE DAILY AS NEEDED FOR COUGH.   . Calcium Carbonate-Vitamin D (CALCIUM 600 + D PO) Take 1 tablet by mouth 2 (two) times daily.    . cholecalciferol (VITAMIN D) 1000 UNITS tablet Take 1,000 Units by mouth daily.   . citalopram (CELEXA) 10 MG tablet Take 5 mg by mouth as needed.  04/09/2016: Has not started  . cyanocobalamin 500 MCG tablet Take 500 mcg by mouth daily.   . cyclobenzaprine (FLEXERIL) 10 MG tablet Take 0.5 tablets (5 mg total) by  mouth 2 (two) times daily as needed for muscle spasms.   . diclofenac sodium (VOLTAREN) 1 % GEL Apply topically 3 (three) times daily as needed.   . diltiazem (CARDIZEM CD) 120 MG 24 hr capsule  06/29/2016: Received from: External Pharmacy  . DiphenhydrAMINE HCl (BENADRYL ALLERGY PO) Take 2 tablets by mouth at bedtime as needed. For allergies   . docusate sodium (COLACE) 100 MG capsule Take 100-300 mg by mouth at bedtime as needed for constipation.   Marland Kitchen pyridoxine (B-6) 100 MG tablet Take 100 mg by mouth daily.   Marland Kitchen zinc gluconate 50 MG tablet Take 50 mg by mouth daily.   . [DISCONTINUED] cyclobenzaprine (FLEXERIL) 10 MG tablet Take 5 mg by mouth 2 (two) times daily as needed for muscle spasms.    . [DISCONTINUED] doxycycline (VIBRA-TABS) 100 MG tablet Take 1 tablet (100 mg total) by mouth 2 (two) times daily.   . [DISCONTINUED] vitamin C (ASCORBIC ACID) 500 MG tablet Take 500 mg by mouth daily.   Marland Kitchen albuterol (PROVENTIL) (2.5 MG/3ML) 0.083% nebulizer solution Take 3 mLs (2.5 mg total) by nebulization every 6 (six) hours as needed for wheezing or shortness of breath.   Marland Kitchen amoxicillin-clavulanate (AUGMENTIN) 875-125 MG tablet Take 1 tablet by mouth 2 (two) times daily.   Marland Kitchen EPINEPHrine (EPIPEN IJ) Inject as directed.   Marland Kitchen EPIPEN 2-PAK 0.3 MG/0.3ML SOAJ injection use as directed for severe allergy reaction   . estradiol (  ESTRACE VAGINAL) 0.1 MG/GM vaginal cream at bedtime. Apply a pea-sized amount to urethra at bedtime   . fenofibrate 160 MG tablet Take 160 mg by mouth at bedtime.    . folic acid (FOLVITE) 630 MCG tablet Take 400 mcg by mouth daily.   Marland Kitchen HYDROmorphone (DILAUDID) 2 MG tablet  06/29/2016: Received from: External Pharmacy  . levocetirizine (XYZAL) 5 MG tablet Take 5 mg by mouth daily as needed.    Marland Kitchen levothyroxine (SYNTHROID, LEVOTHROID) 75 MCG tablet  06/29/2016: Received from: External Pharmacy  . Magnesium 250 MG TABS Take 500 mg by mouth daily.    . Melatonin 5 MG TABS Take 1 tablet by  mouth at bedtime as needed. For sleep   . Multiple Vitamins-Minerals (MULTIVITAMIN WITH MINERALS) tablet Take 1 tablet by mouth daily.   . pantoprazole (PROTONIX) 40 MG tablet Take 40 mg by mouth as needed.    . predniSONE (STERAPRED UNI-PAK 48 TAB) 10 MG (48) TBPK tablet 12 day dose pack. Take by mouth as directed for 12 days   . [DISCONTINUED] HYDROcodone-acetaminophen (NORCO/VICODIN) 5-325 MG tablet Take 1-2 tablets by mouth every 6 (six) hours as needed for moderate pain or severe pain. (Patient not taking: Reported on 08/23/2017)   . [DISCONTINUED] ibandronate (BONIVA) 150 MG tablet Take 150 mg by mouth every 30 (thirty) days. Take in the morning with a full glass of water, on an empty stomach, and do not take anything else by mouth or lie down for the next 30 min. 09/20/2017: develped bony growth in the mouth  . [DISCONTINUED] Liniments (BEN GAY EX) Apply 1 application topically 3 (three) times daily.   . [DISCONTINUED] NEOMYCIN-POLYMYXIN-HYDROCORTISONE (CORTISPORIN) 1 % SOLN otic solution Apply 1-2 drops to toe BID after soaking (Patient not taking: Reported on 08/23/2017)   . [DISCONTINUED] PEG 3350-KCl-NaBcb-NaCl-NaSulf (PEG-3350/ELECTROLYTES) 236 g SOLR Take by mouth.   . [DISCONTINUED] predniSONE (STERAPRED UNI-PAK 48 TAB) 5 MG (48) TBPK tablet 12 day taper - take by mouth as directed for 12 days.    No facility-administered encounter medications on file as of 09/20/2017.     Surgical History: Past Surgical History:  Procedure Laterality Date  . ABDOMINAL HYSTERECTOMY    . BACK SURGERY    . BREAST BIOPSY Right    benign  . BREAST SURGERY Right    lumpectomy  . CATARACT EXTRACTION W/PHACO Left 12/25/2014   Procedure: CATARACT EXTRACTION PHACO AND INTRAOCULAR LENS PLACEMENT (IOC);  Surgeon: Birder Robson, MD;  Location: [facility] ORS;  Service: Ophthalmology;  Laterality: Left;  [place] 00:32 AP% 20.0 CDE 6.49  . COLONOSCOPY W/ BIOPSIES    . COLONOSCOPY WITH PROPOFOL N/A 08/23/2017    Procedure: COLONOSCOPY WITH PROPOFOL;  Surgeon: Manya Silvas, MD;  Location: [facility] ENDOSCOPY;  Service: Endoscopy;  Laterality: N/A;  . CYSTOCELE REPAIR N/A 04/09/2016   Procedure: ANTERIOR REPAIR (CYSTOCELE);  Surgeon: Gae Dry, MD;  Location: [facility] ORS;  Service: Gynecology;  Laterality: N/A;  . DIAGNOSTIC LAPAROSCOPY    . DILATION AND CURETTAGE OF UTERUS    . ESOPHAGOGASTRODUODENOSCOPY (EGD) WITH PROPOFOL N/A 08/23/2017   Procedure: ESOPHAGOGASTRODUODENOSCOPY (EGD) WITH PROPOFOL;  Surgeon: Manya Silvas, MD;  Location: [facility] ENDOSCOPY;  Service: Endoscopy;  Laterality: N/A;  . EYE SURGERY Bilateral   . HERNIA REPAIR Right 2008   Inguinal hernia Repair  . JOINT REPLACEMENT Right    knee  . KNEE ARTHROSCOPY Right   . LUMBAR LAMINECTOMY/DECOMPRESSION MICRODISCECTOMY Right 03/21/2013   Procedure: Right Lumbar five-Sacral one Laminectomy for Synovial  Cyst;  Surgeon: Faythe Ghee, MD;  Location: [facility];  Service: Neurosurgery;  Laterality: Right;  right  . OOPHORECTOMY    . TUBAL LIGATION    . UNILATERAL SALPINGECTOMY Left 04/09/2016   Procedure: UNILATERAL SALPINGECTOMY;  Surgeon: Gae Dry, MD;  Location: [facility] ORS;  Service: Gynecology;  Laterality: Left;  Marland Kitchen VAGINAL HYSTERECTOMY N/A 04/09/2016   Procedure: HYSTERECTOMY VAGINAL;  Surgeon: Gae Dry, MD;  Location: [facility] ORS;  Service: Gynecology;  Laterality: N/A;    Medical History: Past Medical History:  Diagnosis Date  . Anxiety   . Arthritis    osteo  . Asthma    needs rescue inhaler few times a year  . Depression   . Dizziness   . Dysrhythmia    tachycardia  . GERD (gastroesophageal reflux disease)   . Headache(784.0)   . Heart murmur    aortic  . Hyperlipidemia   . Hypertension   . Migraine   . Orthopnea   . Pneumonia    hx  . PONV (postoperative nausea and vomiting)    very anxious during cataract surgery  . Shortness of breath    any exertion, cannot lie flat    Family  History: Family History  Problem Relation Age of Onset  . Pulmonary fibrosis Mother   . Melanoma Father   . Cardiomyopathy Sister        and arrhythmia      Review of Systems  Constitutional: Positive for activity change, chills and fatigue. Negative for fever.  HENT: Positive for congestion, ear pain, postnasal drip, rhinorrhea, sinus pain, sore throat and voice change.   Eyes: Negative.   Respiratory: Positive for cough, shortness of breath and wheezing.   Cardiovascular: Negative for chest pain and palpitations.  Gastrointestinal: Negative for constipation, diarrhea, nausea and vomiting.  Endocrine: Negative for cold intolerance, heat intolerance, polydipsia, polyphagia and polyuria.  Genitourinary: Negative.   Musculoskeletal: Positive for arthralgias and myalgias.  Skin: Negative.   Allergic/Immunologic: Positive for environmental allergies.  Neurological: Positive for weakness and headaches.  Hematological: Positive for adenopathy.  Psychiatric/Behavioral: Negative.      Today's Vitals   09/20/17 1205  BP: (!) 159/79  Pulse: 70  Resp: 16  Temp: (!) 96 F (35.6 C)  SpO2: 95%  Weight: 135 lb 3.2 oz (61.3 kg)  Height: 5\' 1"  (1.549 m)    Physical Exam  Constitutional: She is oriented to person, place, and time. She appears well-developed and well-nourished. No distress.  HENT:  Head: Normocephalic and atraumatic.  Right Ear: Tympanic membrane is bulging.  Left Ear: Tympanic membrane is bulging.  Nose: Rhinorrhea present. Right sinus exhibits maxillary sinus tenderness and frontal sinus tenderness. Left sinus exhibits maxillary sinus tenderness and frontal sinus tenderness.  Mouth/Throat: Posterior oropharyngeal erythema present. No oropharyngeal exudate.  Eyes: EOM are normal. Pupils are equal, round, and reactive to light.  Neck: Normal range of motion. Neck supple. No JVD present. No tracheal deviation present. No thyromegaly present.  Cardiovascular: Normal  rate, regular rhythm and intact distal pulses. Exam reveals no gallop and no friction rub.  Murmur heard.  Systolic murmur is present with a grade of 2/6. Pulmonary/Chest: Effort normal and breath sounds normal. No respiratory distress. She has no wheezes. She has no rales. She exhibits no tenderness.  Very mild wheezing heard throughout the lung fields.   Abdominal: Soft. Bowel sounds are normal. There is no tenderness.  Musculoskeletal: Normal range of motion.  Neurological: She is alert and  oriented to person, place, and time. No cranial nerve deficit.  Skin: Skin is warm and dry. She is not diaphoretic.  Psychiatric: She has a normal mood and affect. Her behavior is normal. Judgment and thought content normal.  Nursing note and vitals reviewed.    LABS: Recent Results (from the past 2160 hour(s))  Surgical pathology     Status: None   Collection Time: 08/23/17 10:37 AM  Result Value Ref Range   SURGICAL PATHOLOGY      Surgical Pathology CASE: [id number] PATIENT: Casilda Carls Stradling Surgical Pathology Report     SPECIMEN SUBMITTED: A. Stomach, antrum; cbx  CLINICAL HISTORY: None provided  PRE-OPERATIVE DIAGNOSIS: PRS HX colon polyp, dysphagia  POST-OPERATIVE DIAGNOSIS: Patchy erythema antrum     DIAGNOSIS: A.  STOMACH, ANTRUM; COLD BIOPSY: - ANTRAL MUCOSA WITH MILD CHRONIC ACTIVE GASTRITIS AND FEATURES OF A HEALING EROSION. - NEGATIVE FOR H. PYLORI, DYSPLASIA AND MALIGNANCY.   GROSS DESCRIPTION:  A. Labeled: C BX antrum  Tissue fragment(s): 2  Size: 0.2-0.3 cm  Description: in formalin, tan fragments  Entirely submitted in one cassette(s).          Final Diagnosis performed by Delorse Lek, MD.  Electronically signed 08/24/2017 1:32:31PM    The electronic signature indicates that the named Attending Pathologist has evaluated the specimen  Technical component performed at [facility], [address], [place], [place] [address] Lab: [id number]  Dir: Brendia Sacks, MD, MMM  Professional component performed at [facility], [facility], [place], [place],  [address] Lab: [phone] Dir: Dellia Nims. Rubinas, MD    Urinalysis, Routine w reflex microscopic     Status: Abnormal   Collection Time: 09/20/17 11:24 AM  Result Value Ref Range   Specific Gravity, UA      <=1.005 (A) 1.005 - 1.030   pH, UA 8.0 (H) 5.0 - 7.5   Color, UA Yellow Yellow   Appearance Ur Clear Clear   Leukocytes, UA Negative Negative   Protein, UA Negative Negative/Trace   Glucose, UA Negative Negative   Ketones, UA Negative Negative   RBC, UA Negative Negative   Bilirubin, UA Negative Negative   Urobilinogen, Ur 0.2 0.2 - 1.0 mg/dL   Nitrite, UA Negative Negative   Microscopic Examination Comment     Comment: Microscopic not indicated and not performed.    Assessment/Plan:  1. Encounter for general adult medical examination with abnormal findings Annual wellness visit today  2. Acute recurrent pansinusitis - predniSONE (STERAPRED UNI-PAK 48 TAB) 10 MG (48) TBPK tablet; 12 day dose pack. Take by mouth as directed for 12 days  Dispense: 48 tablet; Refill: 0 - amoxicillin-clavulanate (AUGMENTIN) 875-125 MG tablet; Take 1 tablet by mouth 2 (two) times daily.  Dispense: 20 tablet; Refill: 0  3. Wheezing - amoxicillin-clavulanate (AUGMENTIN) 875-125 MG tablet; Take 1 tablet by mouth 2 (two) times daily.  Dispense: 20 tablet; Refill: 0 - albuterol (PROVENTIL) (2.5 MG/3ML) 0.083% nebulizer solution; Take 3 mLs (2.5 mg total) by nebulization every 6 (six) hours as needed for wheezing or shortness of breath.  Dispense: 75 mL; Refill: 12  4. Muscle spasm usually experienced in lower legs while taking prednisone.  - cyclobenzaprine (FLEXERIL) 10 MG tablet; Take 0.5 tablets (5 mg total) by mouth 2 (two) times daily as needed for muscle spasms.  Dispense: 45 tablet; Refill: 2  5. Ovarian failure - DG Bone Density; Future  6.  Dysuria - Urinalysis, Routine w reflex microscopic  General Counseling: Mae verbalizes understanding of the  findings of todays visit and agrees with plan of treatment. I have discussed any further diagnostic evaluation that may be needed or ordered today. We also reviewed her medications today. she has been encouraged to call the office with any questions or concerns that should arise related to todays visit.   This patient was seen by Leretha Pol, FNP- C in Collaboration with Dr Lavera Guise as a part of collaborative care agreement    Orders Placed This Encounter  Procedures  . DG Bone Density  . Urinalysis, Routine w reflex microscopic    Meds ordered this encounter  Medications  . predniSONE (STERAPRED UNI-PAK 48 TAB) 10 MG (48) TBPK tablet    Sig: 12 day dose pack. Take by mouth as directed for 12 days    Dispense:  48 tablet    Refill:  0    Discontinue 5mg  dose pack    Order Specific Question:   Supervising Provider    Answer:   Lavera Guise [[id number]]  . cyclobenzaprine (FLEXERIL) 10 MG tablet    Sig: Take 0.5 tablets (5 mg total) by mouth 2 (two) times daily as needed for muscle spasms.    Dispense:  45 tablet    Refill:  2    Order Specific Question:   Supervising Provider    Answer:   Lavera Guise [[id number]]  . amoxicillin-clavulanate (AUGMENTIN) 875-125 MG tablet    Sig: Take 1 tablet by mouth 2 (two) times daily.    Dispense:  20 tablet    Refill:  0    Order Specific Question:   Supervising Provider    Answer:   Lavera Guise [[id number]]  . albuterol (PROVENTIL) (2.5 MG/3ML) 0.083% nebulizer solution    Sig: Take 3 mLs (2.5 mg total) by nebulization every 6 (six) hours as needed for wheezing or shortness of breath.    Dispense:  75 mL    Refill:  12    Order Specific Question:   Supervising Provider    Answer:   Lavera Guise [[id number]]    Time spent: Bee, MD  Internal Medicine

## 2017-09-21 LAB — URINALYSIS, ROUTINE W REFLEX MICROSCOPIC
BILIRUBIN UA: NEGATIVE
Glucose, UA: NEGATIVE
KETONES UA: NEGATIVE
Leukocytes, UA: NEGATIVE
NITRITE UA: NEGATIVE
PH UA: 8 — AB (ref 5.0–7.5)
Protein, UA: NEGATIVE
RBC UA: NEGATIVE
Urobilinogen, Ur: 0.2 mg/dL (ref 0.2–1.0)

## 2017-10-01 DIAGNOSIS — M533 Sacrococcygeal disorders, not elsewhere classified: Secondary | ICD-10-CM | POA: Diagnosis not present

## 2017-10-01 DIAGNOSIS — M25552 Pain in left hip: Secondary | ICD-10-CM | POA: Diagnosis not present

## 2017-10-01 DIAGNOSIS — M7062 Trochanteric bursitis, left hip: Secondary | ICD-10-CM | POA: Diagnosis not present

## 2017-10-04 ENCOUNTER — Ambulatory Visit (INDEPENDENT_AMBULATORY_CARE_PROVIDER_SITE_OTHER): Payer: Medicare Other

## 2017-10-04 DIAGNOSIS — J301 Allergic rhinitis due to pollen: Secondary | ICD-10-CM | POA: Diagnosis not present

## 2017-10-04 DIAGNOSIS — E2839 Other primary ovarian failure: Secondary | ICD-10-CM | POA: Diagnosis not present

## 2017-10-04 DIAGNOSIS — Z780 Asymptomatic menopausal state: Secondary | ICD-10-CM | POA: Diagnosis not present

## 2017-10-04 DIAGNOSIS — M8588 Other specified disorders of bone density and structure, other site: Secondary | ICD-10-CM | POA: Diagnosis not present

## 2017-10-11 ENCOUNTER — Other Ambulatory Visit: Payer: Self-pay | Admitting: Internal Medicine

## 2017-10-14 ENCOUNTER — Other Ambulatory Visit: Payer: Self-pay | Admitting: Internal Medicine

## 2017-10-14 ENCOUNTER — Telehealth: Payer: Self-pay | Admitting: Internal Medicine

## 2017-10-14 ENCOUNTER — Telehealth: Payer: Self-pay

## 2017-10-14 NOTE — Telephone Encounter (Signed)
Spoke with patient and per dr Vanessa Evans patient needs to try otc meds for sinus and for stomach nausea and weak pt needs to keep plenty of fluids down due to stomach virus currently going around./ br

## 2017-10-14 NOTE — Telephone Encounter (Signed)
OTC meds 

## 2017-10-14 NOTE — Progress Notes (Signed)
She has so many allergies. Try otc tylenol cold and sinus, drink plenty of fluids

## 2017-10-18 ENCOUNTER — Ambulatory Visit: Payer: Self-pay

## 2017-10-18 DIAGNOSIS — R5383 Other fatigue: Secondary | ICD-10-CM | POA: Insufficient documentation

## 2017-10-18 DIAGNOSIS — R110 Nausea: Secondary | ICD-10-CM | POA: Diagnosis not present

## 2017-10-18 DIAGNOSIS — R002 Palpitations: Secondary | ICD-10-CM | POA: Diagnosis not present

## 2017-10-18 DIAGNOSIS — R531 Weakness: Secondary | ICD-10-CM

## 2017-10-19 ENCOUNTER — Ambulatory Visit: Payer: Self-pay | Admitting: Internal Medicine

## 2017-10-21 ENCOUNTER — Ambulatory Visit
Admission: RE | Admit: 2017-10-21 | Discharge: 2017-10-21 | Disposition: A | Discharge status: Home / Self Care | Payer: Medicare Other | Source: Ambulatory Visit | Attending: Nurse Practitioner | Admitting: Nurse Practitioner

## 2017-10-21 ENCOUNTER — Encounter: Payer: Self-pay | Admitting: Nurse Practitioner

## 2017-10-21 ENCOUNTER — Ambulatory Visit (INDEPENDENT_AMBULATORY_CARE_PROVIDER_SITE_OTHER): Payer: Medicare Other | Admitting: Nurse Practitioner

## 2017-10-21 ENCOUNTER — Telehealth: Payer: Self-pay | Admitting: Nurse Practitioner

## 2017-10-21 VITALS — BP 153/95 | HR 100 | Temp 97.7°F | Resp 16 | Ht 61.0 in | Wt 138.6 lb

## 2017-10-21 DIAGNOSIS — I10 Essential (primary) hypertension: Secondary | ICD-10-CM

## 2017-10-21 DIAGNOSIS — R110 Nausea: Secondary | ICD-10-CM | POA: Diagnosis not present

## 2017-10-21 DIAGNOSIS — K2900 Acute gastritis without bleeding: Secondary | ICD-10-CM | POA: Diagnosis not present

## 2017-10-21 DIAGNOSIS — R531 Weakness: Secondary | ICD-10-CM | POA: Diagnosis not present

## 2017-10-21 MED ORDER — ONDANSETRON HCL 4 MG PO TABS: 4.0000 mg | ORAL_TABLET | Freq: Three times a day (TID) | ORAL | 1 refills | Status: DC | PRN

## 2017-10-21 NOTE — Progress Notes (Signed)
[facility] [place], [place] [address]  Internal MEDICINE  Office Visit Note  Patient Name: Vanessa Evans  [id number]  [id number]  Date of Service: 11/08/2017  Chief Complaint  Patient presents with  . Nausea    weaknes  . Diarrhea     The patient is c/o significant nausea along with fatigue and weakness. Initially started around 10/11/2017. Saw her GI provider earlier this week. Took her off carafate and votaren gel. She has not had any improvement in nausea. Blood work and u/a were done, both were unremarkable. She did have several episodes of diarrhea last night and this morning. She took imodium AD for this and this has resolved. Her appetite has been unaffected by nausea. Last year, she had ultrasound of her abdomen which did show sludge in the gallbladder. Endoscopy was dine 08/2017 and did show a healing peptic ulcer and evidence of chronic gastritis. They were unable to complete the colonoscopy as she had very tortuous colon. She has another colonoscopy scheduled for 11/08/2017.    Pt is here for a sick visit.     Current Medication:  Outpatient Encounter Medications as of 10/21/2017  Medication Sig Note  . acetaminophen (TYLENOL) 500 MG tablet Take 1,000 mg by mouth every 6 (six) hours as needed.   Marland Kitchen albuterol (PROVENTIL) (2.5 MG/3ML) 0.083% nebulizer solution Take 3 mLs (2.5 mg total) by nebulization every 6 (six) hours as needed for wheezing or shortness of breath.   Marland Kitchen amoxicillin-clavulanate (AUGMENTIN) 875-125 MG tablet Take 1 tablet by mouth 2 (two) times daily.   Marland Kitchen aspirin EC 81 MG tablet Take 81 mg by mouth daily.   . benzonatate (TESSALON) 100 MG capsule TAKE ONE TO TWO CAPSULES TWICE DAILY AS NEEDED FOR COUGH.   . Calcium Carbonate-Vitamin D (CALCIUM 600 + D PO) Take 1 tablet by mouth 2 (two) times daily.    . cholecalciferol (VITAMIN D) 1000 UNITS tablet Take 1,000 Units by mouth daily.   . citalopram (CELEXA) 10 MG tablet Take 5 mg by  mouth as needed.  04/09/2016: Has not started  . cyanocobalamin 500 MCG tablet Take 500 mcg by mouth daily.   . cyclobenzaprine (FLEXERIL) 10 MG tablet Take 0.5 tablets (5 mg total) by mouth 2 (two) times daily as needed for muscle spasms.   . diclofenac sodium (VOLTAREN) 1 % GEL Apply topically 3 (three) times daily as needed.   . diltiazem (CARDIZEM CD) 120 MG 24 hr capsule  06/29/2016: Received from: External Pharmacy  . DiphenhydrAMINE HCl (BENADRYL ALLERGY PO) Take 2 tablets by mouth at bedtime as needed. For allergies   . docusate sodium (COLACE) 100 MG capsule Take 100-300 mg by mouth at bedtime as needed for constipation.   Marland Kitchen EPINEPHrine (EPIPEN IJ) Inject as directed.   Marland Kitchen EPIPEN 2-PAK 0.3 MG/0.3ML SOAJ injection use as directed for severe allergy reaction   . estradiol (ESTRACE VAGINAL) 0.1 MG/GM vaginal cream at bedtime. Apply a pea-sized amount to urethra at bedtime   . fenofibrate 160 MG tablet Take 1 tablet by mouth once a day for cholesterol   . folic acid (FOLVITE) 295 MCG tablet Take 400 mcg by mouth daily.   Marland Kitchen HYDROmorphone (DILAUDID) 2 MG tablet  06/29/2016: Received from: External Pharmacy  . levocetirizine (XYZAL) 5 MG tablet Take 5 mg by mouth daily as needed.    Marland Kitchen levothyroxine (SYNTHROID, LEVOTHROID) 75 MCG tablet  06/29/2016: Received from: External Pharmacy  . Magnesium 250 MG TABS Take 500  mg by mouth daily.    . Melatonin 5 MG TABS Take 1 tablet by mouth at bedtime as needed. For sleep   . Multiple Vitamins-Minerals (MULTIVITAMIN WITH MINERALS) tablet Take 1 tablet by mouth daily.   . ondansetron (ZOFRAN) 4 MG tablet Take 1 tablet (4 mg total) by mouth every 8 (eight) hours as needed for nausea or vomiting.   . pantoprazole (PROTONIX) 40 MG tablet Take 40 mg by mouth 2 (two) times daily.    . predniSONE (STERAPRED UNI-PAK 48 TAB) 10 MG (48) TBPK tablet 12 day dose pack. Take by mouth as directed for 12 days   . pyridoxine (B-6) 100 MG tablet Take 100 mg by mouth daily.    . VENTOLIN HFA 108 (90 Base) MCG/ACT inhaler Take 2 puffs using inhaler four times a day as needed   . zinc gluconate 50 MG tablet Take 50 mg by mouth daily.    No facility-administered encounter medications on file as of 10/21/2017.       Medical History: Past Medical History:  Diagnosis Date  . Anxiety   . Arthritis    osteo  . Asthma    needs rescue inhaler few times a year  . Depression   . Dizziness   . Dysrhythmia    tachycardia  . GERD (gastroesophageal reflux disease)   . Headache(784.0)   . Heart murmur    aortic  . Hyperlipidemia   . Hypertension   . Migraine   . Orthopnea   . Pneumonia    hx  . PONV (postoperative nausea and vomiting)    very anxious during cataract surgery  . Shortness of breath    any exertion, cannot lie flat     Today's Vitals   10/21/17 1420  BP: (!) 153/95  Pulse: 100  Resp: 16  Temp: 97.7 F (36.5 C)  SpO2: 98%  Weight: 138 lb 9.6 oz (62.9 kg)  Height: 5\' 1"  (1.549 m)    Review of Systems  Constitutional: Positive for activity change, appetite change and fatigue. Negative for chills and unexpected weight change.  HENT: Negative for congestion, postnasal drip, rhinorrhea, sneezing and sore throat.   Eyes: Negative.  Negative for redness.  Respiratory: Negative for cough, chest tightness, shortness of breath and wheezing.   Cardiovascular: Negative for chest pain and palpitations.  Gastrointestinal: Positive for diarrhea and nausea. Negative for abdominal pain, constipation and vomiting.  Endocrine: Negative for cold intolerance, heat intolerance, polydipsia, polyphagia and polyuria.  Genitourinary: Negative for dysuria, frequency and urgency.  Musculoskeletal: Positive for back pain. Negative for arthralgias, joint swelling and neck pain.  Skin: Negative for rash.  Allergic/Immunologic: Negative.   Neurological: Negative.  Negative for tremors and numbness.  Hematological: Negative for adenopathy. Does not bruise/bleed  easily.  Psychiatric/Behavioral: Positive for dysphoric mood. Negative for behavioral problems (Depression), sleep disturbance and suicidal ideas. The patient is nervous/anxious.     Physical Exam  Constitutional: She is oriented to person, place, and time. She appears well-developed and well-nourished. No distress.  HENT:  Head: Normocephalic and atraumatic.  Right Ear: Tympanic membrane is bulging.  Left Ear: Tympanic membrane is bulging.  Nose: Rhinorrhea present. Right sinus exhibits maxillary sinus tenderness and frontal sinus tenderness. Left sinus exhibits maxillary sinus tenderness and frontal sinus tenderness.  Mouth/Throat: Posterior oropharyngeal erythema present. No oropharyngeal exudate.  Eyes: Pupils are equal, round, and reactive to light. EOM are normal.  Neck: Normal range of motion. Neck supple. No JVD present. No tracheal deviation present.  No thyromegaly present.  Cardiovascular: Normal rate and regular rhythm. Exam reveals no gallop and no friction rub.  Murmur heard.  Systolic murmur is present with a grade of 2/6. Pulmonary/Chest: Effort normal and breath sounds normal. No respiratory distress. She has no wheezes. She has no rales. She exhibits no tenderness.  Very mild wheezing heard throughout the lung fields.   Abdominal: Soft. Bowel sounds are normal. There is tenderness.  Musculoskeletal: Normal range of motion.  Lymphadenopathy:    She has no cervical adenopathy.  Neurological: She is alert and oriented to person, place, and time. No cranial nerve deficit.  Skin: Skin is warm and dry. She is not diaphoretic.  Psychiatric: She has a normal mood and affect. Her behavior is normal. Judgment and thought content normal.  Nursing note and vitals reviewed.  Assessment/Plan:  1. Nausea - ondansetron (ZOFRAN) 4 MG tablet; Take 1 tablet (4 mg total) by mouth every 8 (eight) hours as needed for nausea or vomiting.  Dispense: 30 tablet; Refill: 1 The 'BRAT' diet is  suggested, then progress to diet as tolerated as symptoms abate. Call if bloody stools, persistent diarrhea, vomiting, fever or abdominal pain.  2. Acute gastritis, presence of bleeding unspecified, unspecified gastritis type - DG Abd 1 View; Future  3. Benign essential HTN Generally stable. Continue bp medications as prescribed.   General Counseling: Lanie verbalizes understanding of the findings of todays visit and agrees with plan of treatment. I have discussed any further diagnostic evaluation that may be needed or ordered today. We also reviewed her medications today. she has been encouraged to call the office with any questions or concerns that should arise related to todays visit.  This patient was seen by Leretha Pol, FNP- C in Collaboration with Dr Lavera Guise as a part of collaborative care agreement    Orders Placed This Encounter  Procedures  . DG Abd 1 View    Meds ordered this encounter  Medications  . ondansetron (ZOFRAN) 4 MG tablet    Sig: Take 1 tablet (4 mg total) by mouth every 8 (eight) hours as needed for nausea or vomiting.    Dispense:  30 tablet    Refill:  1    Order Specific Question:   Supervising Provider    Answer:   Lavera Guise [[id number]]    Time spent: 15 Minutes

## 2017-10-21 NOTE — Telephone Encounter (Signed)
error 

## 2017-10-22 ENCOUNTER — Telehealth: Payer: Self-pay

## 2017-10-22 NOTE — Telephone Encounter (Signed)
-----   Message from Ronnell Freshwater, NP sent at 10/22/2017  8:23 AM EDT ----- Please let the patient know that her abdominal x-ray is normal. Take nausea meds as prescribed at yesterday's visit. I believe she has colonoscopy and follow up with GI scheduled in very near future. thanks

## 2017-10-22 NOTE — Telephone Encounter (Signed)
Pt advised abdominal xray is normal

## 2017-10-26 DIAGNOSIS — E042 Nontoxic multinodular goiter: Secondary | ICD-10-CM | POA: Diagnosis not present

## 2017-11-01 ENCOUNTER — Ambulatory Visit (INDEPENDENT_AMBULATORY_CARE_PROVIDER_SITE_OTHER): Payer: Medicare Other

## 2017-11-01 DIAGNOSIS — R7303 Prediabetes: Secondary | ICD-10-CM | POA: Diagnosis not present

## 2017-11-01 DIAGNOSIS — E039 Hypothyroidism, unspecified: Secondary | ICD-10-CM | POA: Diagnosis not present

## 2017-11-01 DIAGNOSIS — J301 Allergic rhinitis due to pollen: Secondary | ICD-10-CM

## 2017-11-02 DIAGNOSIS — M7541 Impingement syndrome of right shoulder: Secondary | ICD-10-CM | POA: Diagnosis not present

## 2017-11-02 DIAGNOSIS — M25511 Pain in right shoulder: Secondary | ICD-10-CM | POA: Diagnosis not present

## 2017-11-03 DIAGNOSIS — E039 Hypothyroidism, unspecified: Secondary | ICD-10-CM | POA: Diagnosis not present

## 2017-11-03 DIAGNOSIS — R7303 Prediabetes: Secondary | ICD-10-CM | POA: Diagnosis not present

## 2017-11-03 DIAGNOSIS — E042 Nontoxic multinodular goiter: Secondary | ICD-10-CM | POA: Diagnosis not present

## 2017-11-08 ENCOUNTER — Ambulatory Visit: Payer: Medicare Other | Admitting: Anesthesiology

## 2017-11-08 ENCOUNTER — Encounter: Payer: Self-pay | Admitting: *Deleted

## 2017-11-08 ENCOUNTER — Ambulatory Visit
Admission: RE | Admit: 2017-11-08 | Discharge: 2017-11-08 | Disposition: A | Discharge status: Home / Self Care | Payer: Medicare Other | Source: Ambulatory Visit | Attending: Unknown Physician Specialty | Admitting: Unknown Physician Specialty

## 2017-11-08 ENCOUNTER — Encounter
Admission: RE | Disposition: A | Discharge status: Home / Self Care | Payer: Self-pay | Source: Ambulatory Visit | Attending: Unknown Physician Specialty

## 2017-11-08 DIAGNOSIS — K219 Gastro-esophageal reflux disease without esophagitis: Secondary | ICD-10-CM | POA: Insufficient documentation

## 2017-11-08 DIAGNOSIS — E782 Mixed hyperlipidemia: Secondary | ICD-10-CM | POA: Diagnosis not present

## 2017-11-08 DIAGNOSIS — Z1211 Encounter for screening for malignant neoplasm of colon: Secondary | ICD-10-CM | POA: Insufficient documentation

## 2017-11-08 DIAGNOSIS — I10 Essential (primary) hypertension: Secondary | ICD-10-CM | POA: Insufficient documentation

## 2017-11-08 DIAGNOSIS — Z79899 Other long term (current) drug therapy: Secondary | ICD-10-CM | POA: Diagnosis not present

## 2017-11-08 DIAGNOSIS — J45909 Unspecified asthma, uncomplicated: Secondary | ICD-10-CM | POA: Diagnosis not present

## 2017-11-08 DIAGNOSIS — F329 Major depressive disorder, single episode, unspecified: Secondary | ICD-10-CM | POA: Insufficient documentation

## 2017-11-08 DIAGNOSIS — Z86010 Personal history of colonic polyps: Secondary | ICD-10-CM | POA: Insufficient documentation

## 2017-11-08 DIAGNOSIS — Z7982 Long term (current) use of aspirin: Secondary | ICD-10-CM | POA: Diagnosis not present

## 2017-11-08 DIAGNOSIS — Z87891 Personal history of nicotine dependence: Secondary | ICD-10-CM | POA: Insufficient documentation

## 2017-11-08 DIAGNOSIS — K2900 Acute gastritis without bleeding: Secondary | ICD-10-CM | POA: Insufficient documentation

## 2017-11-08 DIAGNOSIS — K5790 Diverticulosis of intestine, part unspecified, without perforation or abscess without bleeding: Secondary | ICD-10-CM | POA: Diagnosis not present

## 2017-11-08 DIAGNOSIS — E785 Hyperlipidemia, unspecified: Secondary | ICD-10-CM | POA: Diagnosis not present

## 2017-11-08 DIAGNOSIS — F418 Other specified anxiety disorders: Secondary | ICD-10-CM | POA: Diagnosis not present

## 2017-11-08 DIAGNOSIS — K5730 Diverticulosis of large intestine without perforation or abscess without bleeding: Secondary | ICD-10-CM | POA: Diagnosis not present

## 2017-11-08 DIAGNOSIS — K648 Other hemorrhoids: Secondary | ICD-10-CM | POA: Diagnosis not present

## 2017-11-08 DIAGNOSIS — E039 Hypothyroidism, unspecified: Secondary | ICD-10-CM | POA: Insufficient documentation

## 2017-11-08 HISTORY — PX: COLONOSCOPY WITH PROPOFOL: SHX5780

## 2017-11-08 SURGERY — COLONOSCOPY WITH PROPOFOL
Anesthesia: General

## 2017-11-08 MED ORDER — LIDOCAINE HCL (PF) 2 % IJ SOLN
INTRAMUSCULAR | Status: AC
  Filled 2017-11-08: qty 10

## 2017-11-08 MED ORDER — SODIUM CHLORIDE 0.9 % IV SOLN
INTRAVENOUS | Status: DC
  Administered 2017-11-08: 13:00:00 via INTRAVENOUS

## 2017-11-08 MED ORDER — MIDAZOLAM HCL 2 MG/2ML IJ SOLN
INTRAMUSCULAR | Status: AC
  Filled 2017-11-08: qty 2

## 2017-11-08 MED ORDER — GLYCOPYRROLATE 0.2 MG/ML IJ SOLN
INTRAMUSCULAR | Status: DC | PRN
  Administered 2017-11-08: 0.2 mg via INTRAVENOUS

## 2017-11-08 MED ORDER — PIPERACILLIN-TAZOBACTAM 3.375 G IVPB
INTRAVENOUS | Status: AC
  Filled 2017-11-08: qty 50

## 2017-11-08 MED ORDER — FENTANYL CITRATE (PF) 100 MCG/2ML IJ SOLN
25.0000 ug | INTRAMUSCULAR | Status: DC | PRN
  Administered 2017-11-08 (×4): 25 ug via INTRAVENOUS

## 2017-11-08 MED ORDER — PROPOFOL 10 MG/ML IV BOLUS
INTRAVENOUS | Status: DC | PRN
  Administered 2017-11-08: 20 mg via INTRAVENOUS
  Administered 2017-11-08: 30 mg via INTRAVENOUS

## 2017-11-08 MED ORDER — MIDAZOLAM HCL 5 MG/5ML IJ SOLN
INTRAMUSCULAR | Status: DC | PRN
  Administered 2017-11-08 (×2): 1 mg via INTRAVENOUS

## 2017-11-08 MED ORDER — GLYCOPYRROLATE 0.2 MG/ML IJ SOLN
INTRAMUSCULAR | Status: AC
  Filled 2017-11-08: qty 1

## 2017-11-08 MED ORDER — PIPERACILLIN-TAZOBACTAM 3.375 G IVPB 30 MIN
3.3750 g | Freq: Once | INTRAVENOUS | Status: AC
  Administered 2017-11-08: 3.375 g via INTRAVENOUS
  Filled 2017-11-08: qty 50

## 2017-11-08 MED ORDER — SODIUM CHLORIDE 0.9 % IV SOLN
INTRAVENOUS | Status: DC
  Administered 2017-11-08: 1000 mL via INTRAVENOUS

## 2017-11-08 MED ORDER — ONDANSETRON HCL 4 MG/2ML IJ SOLN: 4.0000 mg | Freq: Once | INTRAMUSCULAR | Status: DC | PRN

## 2017-11-08 MED ORDER — FENTANYL CITRATE (PF) 100 MCG/2ML IJ SOLN
INTRAMUSCULAR | Status: AC
  Filled 2017-11-08: qty 2

## 2017-11-08 MED ORDER — PROPOFOL 500 MG/50ML IV EMUL
INTRAVENOUS | Status: DC | PRN
  Administered 2017-11-08: 50 ug/kg/min via INTRAVENOUS

## 2017-11-08 MED ORDER — LIDOCAINE HCL (PF) 2 % IJ SOLN
INTRAMUSCULAR | Status: DC | PRN
  Administered 2017-11-08: 50 mg

## 2017-11-08 NOTE — Anesthesia Postprocedure Evaluation (Signed)
Anesthesia Post Note  Patient: Vanessa Evans  Procedure(s) Performed: COLONOSCOPY WITH PROPOFOL (N/A )  Patient location during evaluation: PACU Anesthesia Type: General Level of consciousness: awake and alert and oriented Pain management: pain level controlled Vital Signs Assessment: post-procedure vital signs reviewed and stable Respiratory status: spontaneous breathing Cardiovascular status: blood pressure returned to baseline Anesthetic complications: no     Last Vitals:  Vitals:   11/08/17 1408 11/08/17 1418  BP: 102/67 122/66  Pulse: (!) 101 (!) 101  Resp: 18 19  Temp: (!) 36.2 C   SpO2: 94% 96%    Last Pain:  Vitals:   11/08/17 1418  TempSrc:   PainSc: 0-No pain                 Chrysa Rampy

## 2017-11-08 NOTE — Transfer of Care (Signed)
Immediate Anesthesia Transfer of Care Note  Patient: Vanessa Evans  Procedure(s) Performed: COLONOSCOPY WITH PROPOFOL (N/A )  Patient Location: PACU  Anesthesia Type:General  Level of Consciousness: sedated  Airway & Oxygen Therapy: Patient Spontanous Breathing and Patient connected to nasal cannula oxygen  Post-op Assessment: Report given to RN and Post -op Vital signs reviewed and stable  Post vital signs: Reviewed and stable  Last Vitals:  Vitals Value Taken Time  BP    Temp    Pulse    Resp    SpO2      Last Pain:  Vitals:   11/08/17 1301  TempSrc: Tympanic         Complications: No apparent anesthesia complications

## 2017-11-08 NOTE — Anesthesia Post-op Follow-up Note (Signed)
Anesthesia QCDR form completed.        

## 2017-11-08 NOTE — Op Note (Signed)
[facility] Patient Name: Vanessa Evans Procedure Date: 11/08/2017 1:27 PM MRN: [id number] Account #: [id number] Date of Birth: 07/27/1946 Admit Type: Outpatient Age: 72 Room: [facility] ENDO ROOM 1 Gender: Female Note Status: Finalized Procedure:            Colonoscopy Indications:          High risk colon cancer surveillance: Personal history                        of colonic polyps Providers:            Manya Silvas, MD Referring MD:         Lavera Guise, MD (Referring MD) Medicines:            Propofol per Anesthesia Complications:        No immediate complications. Procedure:            Pre-Anesthesia Assessment:                       - After reviewing the risks and benefits, the patient                        was deemed in satisfactory condition to undergo the                        procedure.                       After obtaining informed consent, the colonoscope was                        passed under direct vision. Throughout the procedure,                        the patient's blood pressure, pulse, and oxygen                        saturations were monitored continuously. The                        Colonoscope was introduced through the anus and                        advanced to the the cecum, identified by appendiceal                        orifice and ileocecal valve. The colonoscopy was                        performed with difficulty due to a tortuous colon.                        Successful completion of the procedure was aided by                        applying abdominal pressure. The patient tolerated the                        procedure well. The quality of the bowel preparation  was good. Findings:      The colon was difficult and required pressure on abdomen and some slide       by technique.      A few small-mouthed diverticula were found in the sigmoid colon.      The exam was otherwise without  abnormality. Impression:           - Diverticulosis in the sigmoid colon.                       - The examination was otherwise normal.                       - No specimens collected. Recommendation:       - Repeat colonoscopy in 5-10 years for screening                        purposes. Manya Silvas, MD 11/08/2017 2:08:45 PM This report has been signed electronically. Number of Addenda: 0 Note Initiated On: 11/08/2017 1:27 PM Scope Withdrawal Time: 0 hours 7 minutes 4 seconds  Total Procedure Duration: 0 hours 31 minutes 51 seconds       [facility]

## 2017-11-08 NOTE — Anesthesia Preprocedure Evaluation (Addendum)
Anesthesia Evaluation  Patient identified by MRN, date of birth, ID band Patient awake    Reviewed: Allergy & Precautions, NPO status , Patient's Chart, lab work & pertinent test results  History of Anesthesia Complications (+) PONV, DIFFICULT AIRWAY and history of anesthetic complications  Airway Mallampati: II       Dental   Pulmonary shortness of breath and with exertion, asthma , pneumonia, resolved, former smoker,    Pulmonary exam normal        Cardiovascular hypertension, Pt. on medications + Orthopnea  (-) Past MI and (-) CHF Normal cardiovascular exam+ dysrhythmias (tachycardia) + Valvular Problems/Murmurs (mild AS, no tx) AS      Neuro/Psych  Headaches, PSYCHIATRIC DISORDERS Anxiety Depression    GI/Hepatic Neg liver ROS, GERD  Medicated and Controlled,  Endo/Other  neg diabetes  Renal/GU negative Renal ROS     Musculoskeletal  (+) Arthritis , Osteoarthritis,    Abdominal Normal abdominal exam  (+)   Peds negative pediatric ROS (+)  Hematology negative hematology ROS (+)   Anesthesia Other Findings Patient had episode of severe bradycardia with previous endoscopic  Attempts... Will use robinula and atropine if needed.  Reproductive/Obstetrics                            Anesthesia Physical  Anesthesia Plan  ASA: III  Anesthesia Plan: General   Post-op Pain Management:    Induction: Intravenous  PONV Risk Score and Plan: 3 and TIVA, Propofol infusion, Treatment may vary due to age or medical condition and Ondansetron  Airway Management Planned: Nasal Cannula  Additional Equipment:   Intra-op Plan:   Post-operative Plan:   Informed Consent: I have reviewed the patients History and Physical, chart, labs and discussed the procedure including the risks, benefits and alternatives for the proposed anesthesia with the patient or authorized representative who has indicated  his/her understanding and acceptance.     Plan Discussed with:   Anesthesia Plan Comments:         Anesthesia Quick Evaluation

## 2017-11-08 NOTE — H&P (Signed)
Primary Care Physician:  Lavera Guise, MD Primary Gastroenterologist:  [name]  Pre-Procedure History & Physical: HPI:  Vanessa Evans is a 72 y.o. female is here for an colonoscopy.  She has a history of colon polyps.   Past Medical History:  Diagnosis Date  . Anxiety   . Arthritis    osteo  . Asthma    needs rescue inhaler few times a year  . Depression   . Dizziness   . Dysrhythmia    tachycardia  . GERD (gastroesophageal reflux disease)   . Headache(784.0)   . Heart murmur    aortic  . Hyperlipidemia   . Hypertension   . Migraine   . Orthopnea   . Pneumonia    hx  . PONV (postoperative nausea and vomiting)    very anxious during cataract surgery  . Shortness of breath    any exertion, cannot lie flat    Past Surgical History:  Procedure Laterality Date  . ABDOMINAL HYSTERECTOMY    . BACK SURGERY    . BREAST BIOPSY Right    benign  . BREAST SURGERY Right    lumpectomy  . CATARACT EXTRACTION W/PHACO Left 12/25/2014   Procedure: CATARACT EXTRACTION PHACO AND INTRAOCULAR LENS PLACEMENT (IOC);  Surgeon: Birder Robson, MD;  Location: [facility] ORS;  Service: Ophthalmology;  Laterality: Left;  [place] 00:32 AP% 20.0 CDE 6.49  . COLONOSCOPY W/ BIOPSIES    . COLONOSCOPY WITH PROPOFOL N/A 08/23/2017   Procedure: COLONOSCOPY WITH PROPOFOL;  Surgeon: Manya Silvas, MD;  Location: [facility] ENDOSCOPY;  Service: Endoscopy;  Laterality: N/A;  . CYSTOCELE REPAIR N/A 04/09/2016   Procedure: ANTERIOR REPAIR (CYSTOCELE);  Surgeon: Gae Dry, MD;  Location: [facility] ORS;  Service: Gynecology;  Laterality: N/A;  . DIAGNOSTIC LAPAROSCOPY    . DILATION AND CURETTAGE OF UTERUS    . ESOPHAGOGASTRODUODENOSCOPY (EGD) WITH PROPOFOL N/A 08/23/2017   Procedure: ESOPHAGOGASTRODUODENOSCOPY (EGD) WITH PROPOFOL;  Surgeon: Manya Silvas, MD;  Location: [facility] ENDOSCOPY;  Service: Endoscopy;  Laterality: N/A;  . EYE SURGERY Bilateral   . HERNIA REPAIR Right 2008   Inguinal hernia Repair   . JOINT REPLACEMENT Right    knee  . KNEE ARTHROSCOPY Right   . LUMBAR LAMINECTOMY/DECOMPRESSION MICRODISCECTOMY Right 03/21/2013   Procedure: Right Lumbar five-Sacral one Laminectomy for Synovial Cyst;  Surgeon: Faythe Ghee, MD;  Location: [facility] NEURO ORS;  Service: Neurosurgery;  Laterality: Right;  right  . OOPHORECTOMY    . TUBAL LIGATION    . UNILATERAL SALPINGECTOMY Left 04/09/2016   Procedure: UNILATERAL SALPINGECTOMY;  Surgeon: Gae Dry, MD;  Location: [facility] ORS;  Service: Gynecology;  Laterality: Left;  Marland Kitchen VAGINAL HYSTERECTOMY N/A 04/09/2016   Procedure: HYSTERECTOMY VAGINAL;  Surgeon: Gae Dry, MD;  Location: [facility] ORS;  Service: Gynecology;  Laterality: N/A;    Prior to Admission medications   Medication Sig Start Date End Date Taking? Authorizing Provider  acetaminophen (TYLENOL) 500 MG tablet Take 1,000 mg by mouth every 6 (six) hours as needed.   Yes [provider]  aspirin EC 81 MG tablet Take 81 mg by mouth daily.   Yes [provider]  cholecalciferol (VITAMIN D) 1000 UNITS tablet Take 1,000 Units by mouth daily.   Yes [provider]  citalopram (CELEXA) 10 MG tablet Take 5 mg by mouth as needed.    Yes [provider]  cyanocobalamin 500 MCG tablet Take 500 mcg by mouth daily.   Yes [provider]  EPINEPHrine (EPIPEN IJ) Inject as directed.   Yes [provider]  EPIPEN 2-PAK 0.3 MG/0.3ML SOAJ injection use as directed for severe allergy reaction 08/27/17  Yes Devona Konig A, MD  estradiol (ESTRACE VAGINAL) 0.1 MG/GM vaginal cream at bedtime. Apply a pea-sized amount to urethra at bedtime   Yes [provider]  fenofibrate 160 MG tablet Take 1 tablet by mouth once a day for cholesterol 10/11/17  Yes Boscia, Heather E, NP  folic acid (FOLVITE) 536 MCG tablet Take 400 mcg by mouth daily.   Yes [provider]  levocetirizine (XYZAL) 5 MG tablet Take 5 mg by mouth daily as needed.    Yes  [provider]  levothyroxine (SYNTHROID, LEVOTHROID) 50 MCG tablet Take 50 mcg by mouth daily.   Yes [provider]  Magnesium 250 MG TABS Take 500 mg by mouth daily.    Yes [provider]  Melatonin 5 MG TABS Take 1 tablet by mouth at bedtime as needed. For sleep   Yes [provider]  Multiple Vitamins-Minerals (MULTIVITAMIN WITH MINERALS) tablet Take 1 tablet by mouth daily.   Yes [provider]  pyridoxine (B-6) 100 MG tablet Take 100 mg by mouth daily.   Yes [provider]  VENTOLIN HFA 108 (90 Base) MCG/ACT inhaler Take 2 puffs using inhaler four times a day as needed 10/11/17  Yes Boscia, Heather E, NP  zinc gluconate 50 MG tablet Take 50 mg by mouth daily.   Yes [provider]  albuterol (PROVENTIL) (2.5 MG/3ML) 0.083% nebulizer solution Take 3 mLs (2.5 mg total) by nebulization every 6 (six) hours as needed for wheezing or shortness of breath. 09/20/17   Ronnell Freshwater, NP  amoxicillin-clavulanate (AUGMENTIN) 875-125 MG tablet Take 1 tablet by mouth 2 (two) times daily. 09/20/17   Ronnell Freshwater, NP  benzonatate (TESSALON) 100 MG capsule TAKE ONE TO TWO CAPSULES TWICE DAILY AS NEEDED FOR COUGH. 09/13/17   Lavera Guise, MD  Calcium Carbonate-Vitamin D (CALCIUM 600 + D PO) Take 1 tablet by mouth 2 (two) times daily.     [provider]  cyclobenzaprine (FLEXERIL) 10 MG tablet Take 0.5 tablets (5 mg total) by mouth 2 (two) times daily as needed for muscle spasms. Patient not taking: Reported on 11/08/2017 09/20/17   Ronnell Freshwater, NP  diclofenac sodium (VOLTAREN) 1 % GEL Apply topically 3 (three) times daily as needed.    [provider]  diltiazem (CARDIZEM CD) 120 MG 24 hr capsule  05/18/16   [provider]  DiphenhydrAMINE HCl (BENADRYL ALLERGY PO) Take 2 tablets by mouth at bedtime as needed. For allergies    [provider]  docusate sodium (COLACE) 100 MG capsule Take 100-300 mg by  mouth at bedtime as needed for constipation.    [provider]  HYDROmorphone (DILAUDID) 2 MG tablet  04/09/16   [provider]  levothyroxine (SYNTHROID, LEVOTHROID) 75 MCG tablet  06/22/16   [provider]  ondansetron (ZOFRAN) 4 MG tablet Take 1 tablet (4 mg total) by mouth every 8 (eight) hours as needed for nausea or vomiting. 10/21/17   Ronnell Freshwater, NP  pantoprazole (PROTONIX) 40 MG tablet Take 40 mg by mouth 2 (two) times daily.     [provider]  predniSONE (STERAPRED UNI-PAK 48 TAB) 10 MG (48) TBPK tablet 12 day dose pack. Take by mouth as directed for 12 days 09/20/17   Ronnell Freshwater, NP  sucralfate ([place])  1 g tablet Take 1 g by mouth 3 (three) times daily between meals.    [provider]    Allergies as of 09/26/2017 - Review Complete 09/20/2017  Allergen Reaction Noted  . Codeine Shortness Of Breath and Itching 03/07/2013  . Levofloxacin  11/08/2013  . Ultram [tramadol] Itching and Other (See Comments) 03/07/2013  . Nitrofuran derivatives Diarrhea 03/07/2013  . Azithromycin  02/05/2014  . Levaquin [levofloxacin in d5w] Other (See Comments) 03/07/2013  . Nitrofurantoin Diarrhea 02/05/2014  . Oxycontin [oxycodone] Itching 08/20/2017  . Sulfacetamide  08/20/2017  . Ultracet [tramadol-acetaminophen] Itching 03/07/2013  . Ciprofloxacin Other (See Comments) 03/07/2013  . Ketorolac Itching 12/14/2013  . Nsaids Other (See Comments) 03/07/2013  . Sulfa antibiotics Itching, Rash, and Other (See Comments) 03/07/2013  . Sulfasalazine Itching, Other (See Comments), and Rash 05/29/2014  . Tolmetin  05/29/2014    Family History  Problem Relation Age of Onset  . Pulmonary fibrosis Mother   . Melanoma Father   . Cardiomyopathy Sister        and arrhythmia    Social History   Socioeconomic History  . Marital status: Married    Spouse name: Not on file  . Number of children: Not on file  . Years of education: Not on  file  . Highest education level: Not on file  Occupational History  . Not on file  Social Needs  . Financial resource strain: Not on file  . Food insecurity:    Worry: Not on file    Inability: Not on file  . Transportation needs:    Medical: Not on file    Non-medical: Not on file  Tobacco Use  . Smoking status: Former Research scientist (life sciences)  . Smokeless tobacco: Never Used  Substance and Sexual Activity  . Alcohol use: Yes    Comment: occasional wine  . Drug use: No  . Sexual activity: Not on file  Lifestyle  . Physical activity:    Days per week: Not on file    Minutes per session: Not on file  . Stress: Not on file  Relationships  . Social connections:    Talks on phone: Not on file    Gets together: Not on file    Attends religious service: Not on file    Active member of club or organization: Not on file    Attends meetings of clubs or organizations: Not on file    Relationship status: Not on file  . Intimate partner violence:    Fear of current or ex partner: Not on file    Emotionally abused: Not on file    Physically abused: Not on file    Forced sexual activity: Not on file  Other Topics Concern  . Not on file  Social History Narrative  . Not on file    Review of Systems: See HPI, otherwise negative ROS  Physical Exam: BP (!) 144/83   Pulse 95   Temp 98.7 F (37.1 C) (Tympanic)   Resp 20   SpO2 99%  General:   Alert,  pleasant and cooperative in NAD Head:  Normocephalic and atraumatic. Neck:  Supple; no masses or thyromegaly. Lungs:  Clear throughout to auscultation.    Heart:  Regular rate and rhythm. Abdomen:  Soft, nontender and nondistended. Normal bowel sounds, without guarding, and without rebound.   Neurologic:  Alert and  oriented x4;  grossly normal neurologically.  Impression/Plan: Vanessa Evans is here for an colonoscopy to be performed for colonoscopy for personal  history of colon polyps.  Risks, benefits, limitations, and alternatives  regarding  colonoscopy have been reviewed with the patient.  Questions have been answered.  All parties agreeable.   Gaylyn Cheers, MD  11/08/2017, 1:18 PM

## 2017-11-15 ENCOUNTER — Ambulatory Visit (INDEPENDENT_AMBULATORY_CARE_PROVIDER_SITE_OTHER): Payer: Medicare Other

## 2017-11-15 DIAGNOSIS — J301 Allergic rhinitis due to pollen: Secondary | ICD-10-CM

## 2017-11-15 DIAGNOSIS — E782 Mixed hyperlipidemia: Secondary | ICD-10-CM | POA: Diagnosis not present

## 2017-11-15 DIAGNOSIS — I10 Essential (primary) hypertension: Secondary | ICD-10-CM | POA: Diagnosis not present

## 2017-11-15 DIAGNOSIS — I471 Supraventricular tachycardia: Secondary | ICD-10-CM | POA: Diagnosis not present

## 2017-11-15 DIAGNOSIS — R001 Bradycardia, unspecified: Secondary | ICD-10-CM | POA: Diagnosis not present

## 2017-11-17 ENCOUNTER — Other Ambulatory Visit: Payer: Self-pay | Admitting: Internal Medicine

## 2017-11-18 ENCOUNTER — Other Ambulatory Visit: Payer: Self-pay

## 2017-11-18 MED ORDER — FENOFIBRATE 160 MG PO TABS: ORAL_TABLET | ORAL | 5 refills | Status: DC

## 2017-11-29 ENCOUNTER — Ambulatory Visit (INDEPENDENT_AMBULATORY_CARE_PROVIDER_SITE_OTHER): Payer: Medicare Other

## 2017-11-29 DIAGNOSIS — J301 Allergic rhinitis due to pollen: Secondary | ICD-10-CM

## 2017-12-10 ENCOUNTER — Other Ambulatory Visit: Payer: Self-pay | Admitting: Nurse Practitioner

## 2017-12-10 DIAGNOSIS — R1013 Epigastric pain: Secondary | ICD-10-CM

## 2017-12-10 DIAGNOSIS — K2930 Chronic superficial gastritis without bleeding: Secondary | ICD-10-CM | POA: Diagnosis not present

## 2017-12-10 DIAGNOSIS — R1011 Right upper quadrant pain: Secondary | ICD-10-CM | POA: Diagnosis not present

## 2017-12-10 DIAGNOSIS — K222 Esophageal obstruction: Secondary | ICD-10-CM | POA: Diagnosis not present

## 2017-12-10 DIAGNOSIS — R110 Nausea: Secondary | ICD-10-CM | POA: Diagnosis not present

## 2017-12-12 DIAGNOSIS — K2930 Chronic superficial gastritis without bleeding: Secondary | ICD-10-CM | POA: Insufficient documentation

## 2017-12-12 DIAGNOSIS — K222 Esophageal obstruction: Secondary | ICD-10-CM | POA: Insufficient documentation

## 2017-12-13 ENCOUNTER — Ambulatory Visit (INDEPENDENT_AMBULATORY_CARE_PROVIDER_SITE_OTHER): Payer: Medicare Other

## 2017-12-13 DIAGNOSIS — J301 Allergic rhinitis due to pollen: Secondary | ICD-10-CM | POA: Diagnosis not present

## 2017-12-15 ENCOUNTER — Ambulatory Visit
Admission: RE | Admit: 2017-12-15 | Discharge: 2017-12-15 | Disposition: A | Discharge status: Home / Self Care | Payer: Medicare Other | Source: Ambulatory Visit | Attending: Nurse Practitioner | Admitting: Nurse Practitioner

## 2017-12-15 DIAGNOSIS — R1013 Epigastric pain: Secondary | ICD-10-CM | POA: Diagnosis not present

## 2017-12-15 DIAGNOSIS — R1011 Right upper quadrant pain: Secondary | ICD-10-CM | POA: Diagnosis not present

## 2017-12-15 DIAGNOSIS — R110 Nausea: Secondary | ICD-10-CM | POA: Insufficient documentation

## 2017-12-15 DIAGNOSIS — I700 Atherosclerosis of aorta: Secondary | ICD-10-CM | POA: Insufficient documentation

## 2017-12-15 MED ORDER — IOPAMIDOL (ISOVUE-300) INJECTION 61%
100.0000 mL | Freq: Once | INTRAVENOUS | Status: AC | PRN
  Administered 2017-12-15: 100 mL via INTRAVENOUS

## 2017-12-20 ENCOUNTER — Other Ambulatory Visit: Payer: Self-pay | Admitting: Nurse Practitioner

## 2017-12-20 DIAGNOSIS — R1011 Right upper quadrant pain: Secondary | ICD-10-CM

## 2017-12-22 ENCOUNTER — Other Ambulatory Visit: Payer: Self-pay | Admitting: Nurse Practitioner

## 2017-12-22 MED ORDER — CITALOPRAM HYDROBROMIDE 10 MG PO TABS: ORAL_TABLET | ORAL | 0 refills | Status: DC

## 2017-12-23 ENCOUNTER — Ambulatory Visit: Payer: Medicare Other

## 2017-12-23 ENCOUNTER — Ambulatory Visit
Admission: RE | Admit: 2017-12-23 | Discharge: 2017-12-23 | Disposition: A | Discharge status: Home / Self Care | Payer: Medicare Other | Source: Ambulatory Visit | Attending: Nurse Practitioner | Admitting: Nurse Practitioner

## 2017-12-23 DIAGNOSIS — R1011 Right upper quadrant pain: Secondary | ICD-10-CM | POA: Insufficient documentation

## 2017-12-23 DIAGNOSIS — R110 Nausea: Secondary | ICD-10-CM | POA: Insufficient documentation

## 2017-12-27 ENCOUNTER — Ambulatory Visit (INDEPENDENT_AMBULATORY_CARE_PROVIDER_SITE_OTHER): Payer: Medicare Other

## 2017-12-27 DIAGNOSIS — J301 Allergic rhinitis due to pollen: Secondary | ICD-10-CM

## 2017-12-28 ENCOUNTER — Other Ambulatory Visit: Payer: Self-pay | Admitting: Nurse Practitioner

## 2017-12-28 DIAGNOSIS — R110 Nausea: Secondary | ICD-10-CM

## 2018-01-10 ENCOUNTER — Ambulatory Visit (INDEPENDENT_AMBULATORY_CARE_PROVIDER_SITE_OTHER): Payer: Medicare Other

## 2018-01-10 ENCOUNTER — Other Ambulatory Visit: Payer: Self-pay

## 2018-01-10 DIAGNOSIS — J301 Allergic rhinitis due to pollen: Secondary | ICD-10-CM | POA: Diagnosis not present

## 2018-01-10 MED ORDER — DICLOFENAC SODIUM 1 % TD GEL: TRANSDERMAL | 1 refills | Status: DC

## 2018-01-10 MED ORDER — LEVOCETIRIZINE DIHYDROCHLORIDE 5 MG PO TABS: 5.0000 mg | ORAL_TABLET | Freq: Every day | ORAL | 5 refills | Status: DC | PRN

## 2018-01-11 ENCOUNTER — Ambulatory Visit
Admission: RE | Admit: 2018-01-11 | Discharge: 2018-01-11 | Disposition: A | Discharge status: Home / Self Care | Payer: Medicare Other | Source: Ambulatory Visit | Attending: Nurse Practitioner | Admitting: Nurse Practitioner

## 2018-01-11 DIAGNOSIS — R1011 Right upper quadrant pain: Secondary | ICD-10-CM | POA: Diagnosis not present

## 2018-01-11 DIAGNOSIS — M25511 Pain in right shoulder: Secondary | ICD-10-CM | POA: Diagnosis not present

## 2018-01-11 DIAGNOSIS — Z8739 Personal history of other diseases of the musculoskeletal system and connective tissue: Secondary | ICD-10-CM | POA: Diagnosis not present

## 2018-01-11 DIAGNOSIS — M7541 Impingement syndrome of right shoulder: Secondary | ICD-10-CM | POA: Diagnosis not present

## 2018-01-11 DIAGNOSIS — R110 Nausea: Secondary | ICD-10-CM | POA: Insufficient documentation

## 2018-01-11 MED ORDER — TECHNETIUM TC 99M MEBROFENIN IV KIT
5.6600 | PACK | Freq: Once | INTRAVENOUS | Status: AC | PRN
  Administered 2018-01-11: 5.66 via INTRAVENOUS

## 2018-01-13 ENCOUNTER — Other Ambulatory Visit: Payer: Self-pay | Admitting: Orthopedic Surgery

## 2018-01-13 DIAGNOSIS — M25511 Pain in right shoulder: Secondary | ICD-10-CM

## 2018-01-13 DIAGNOSIS — M7541 Impingement syndrome of right shoulder: Secondary | ICD-10-CM

## 2018-01-17 ENCOUNTER — Other Ambulatory Visit: Payer: Self-pay | Admitting: Nurse Practitioner

## 2018-01-17 ENCOUNTER — Telehealth: Payer: Self-pay | Admitting: Nurse Practitioner

## 2018-01-17 DIAGNOSIS — F410 Panic disorder [episodic paroxysmal anxiety] without agoraphobia: Secondary | ICD-10-CM

## 2018-01-17 MED ORDER — DIAZEPAM 5 MG PO TABS: ORAL_TABLET | ORAL | 0 refills | Status: DC

## 2018-01-17 NOTE — Telephone Encounter (Signed)
For claustrophobia r/t MRI on Friday, added diazepam 5mg  1.5 hours prior to procedure. May repeat in 1 hour if needed. I sent 2 to the pharmacy. Please make sure to have driver to and from the MRI.

## 2018-01-17 NOTE — Progress Notes (Signed)
For claustrophobia r/t MRI on Friday, added diazepam 5mg  1.5 hours prior to procedure. May repeat in 1 hour if needed. I sent 2 to the pharmacy. Please make sure to have driver to and from the MRI.

## 2018-01-20 ENCOUNTER — Telehealth: Payer: Self-pay

## 2018-01-20 DIAGNOSIS — R1013 Epigastric pain: Secondary | ICD-10-CM | POA: Diagnosis not present

## 2018-01-20 NOTE — Telephone Encounter (Signed)
Patient advised that heather called in  diazepam 5mg . Patient should take this  1.5 hours prior to procedure. May repeat in 1 hour if needed. She was advised that she will need a driver. tat

## 2018-01-21 ENCOUNTER — Ambulatory Visit: Payer: Self-pay | Admitting: Nurse Practitioner

## 2018-01-21 ENCOUNTER — Ambulatory Visit
Admission: RE | Admit: 2018-01-21 | Discharge: 2018-01-21 | Disposition: A | Discharge status: Home / Self Care | Payer: Medicare Other | Source: Ambulatory Visit | Attending: Orthopedic Surgery | Admitting: Orthopedic Surgery

## 2018-01-21 ENCOUNTER — Encounter (INDEPENDENT_AMBULATORY_CARE_PROVIDER_SITE_OTHER): Payer: Self-pay

## 2018-01-21 DIAGNOSIS — M25511 Pain in right shoulder: Secondary | ICD-10-CM | POA: Diagnosis not present

## 2018-01-21 DIAGNOSIS — M7541 Impingement syndrome of right shoulder: Secondary | ICD-10-CM | POA: Insufficient documentation

## 2018-01-21 DIAGNOSIS — S46811A Strain of other muscles, fascia and tendons at shoulder and upper arm level, right arm, initial encounter: Secondary | ICD-10-CM | POA: Diagnosis not present

## 2018-01-21 DIAGNOSIS — X58XXXA Exposure to other specified factors, initial encounter: Secondary | ICD-10-CM | POA: Diagnosis not present

## 2018-01-21 DIAGNOSIS — M19011 Primary osteoarthritis, right shoulder: Secondary | ICD-10-CM | POA: Diagnosis not present

## 2018-01-21 DIAGNOSIS — D1721 Benign lipomatous neoplasm of skin and subcutaneous tissue of right arm: Secondary | ICD-10-CM | POA: Insufficient documentation

## 2018-01-24 ENCOUNTER — Ambulatory Visit (INDEPENDENT_AMBULATORY_CARE_PROVIDER_SITE_OTHER): Payer: Medicare Other | Admitting: Nurse Practitioner

## 2018-01-24 ENCOUNTER — Encounter: Payer: Self-pay | Admitting: Nurse Practitioner

## 2018-01-24 ENCOUNTER — Ambulatory Visit (INDEPENDENT_AMBULATORY_CARE_PROVIDER_SITE_OTHER): Payer: Medicare Other

## 2018-01-24 VITALS — BP 140/80 | HR 76 | Resp 16 | Ht 61.0 in | Wt 140.0 lb

## 2018-01-24 DIAGNOSIS — M25511 Pain in right shoulder: Secondary | ICD-10-CM

## 2018-01-24 DIAGNOSIS — I700 Atherosclerosis of aorta: Secondary | ICD-10-CM | POA: Diagnosis not present

## 2018-01-24 DIAGNOSIS — R110 Nausea: Secondary | ICD-10-CM | POA: Diagnosis not present

## 2018-01-24 DIAGNOSIS — F339 Major depressive disorder, recurrent, unspecified: Secondary | ICD-10-CM

## 2018-01-24 DIAGNOSIS — Z1239 Encounter for other screening for malignant neoplasm of breast: Secondary | ICD-10-CM

## 2018-01-24 DIAGNOSIS — N393 Stress incontinence (female) (male): Secondary | ICD-10-CM | POA: Diagnosis not present

## 2018-01-24 DIAGNOSIS — Z1231 Encounter for screening mammogram for malignant neoplasm of breast: Secondary | ICD-10-CM | POA: Diagnosis not present

## 2018-01-24 DIAGNOSIS — J301 Allergic rhinitis due to pollen: Secondary | ICD-10-CM

## 2018-01-24 DIAGNOSIS — I10 Essential (primary) hypertension: Secondary | ICD-10-CM | POA: Diagnosis not present

## 2018-01-24 MED ORDER — ESTRADIOL 0.1 MG/GM VA CREA: TOPICAL_CREAM | VAGINAL | 5 refills | Status: DC

## 2018-01-24 MED ORDER — CITALOPRAM HYDROBROMIDE 10 MG PO TABS: 10.0000 mg | ORAL_TABLET | Freq: Every day | ORAL | 3 refills | Status: DC

## 2018-01-24 NOTE — Progress Notes (Signed)
[facility] [place], [place] [address]  Internal MEDICINE  Office Visit Note  Patient Name: Vanessa Evans  [id number]  [id number]  Date of Service: 02/13/2018   Pt is here for routine follow up.   Chief Complaint  Patient presents with  . Shoulder Pain    right  . Nausea    The patient c/o persistent nausea. She is currently seeing GI specialist for continued evaluation and treatent.   Shoulder Pain   The pain is present in the right shoulder and right arm. This is a recurrent problem. The current episode started more than 1 month ago. There has been no history of extremity trauma. The problem occurs intermittently. The problem has been waxing and waning. The quality of the pain is described as aching. The pain is at a severity of 2/10. The pain is mild. Associated symptoms include numbness. The symptoms are aggravated by activity and lying down. She has tried acetaminophen for the symptoms. The treatment provided mild relief. Her past medical history is significant for osteoarthritis.       Current Medication: Outpatient Encounter Medications as of 01/24/2018  Medication Sig Note  . albuterol (PROVENTIL) (2.5 MG/3ML) 0.083% nebulizer solution Take 3 mLs (2.5 mg total) by nebulization every 6 (six) hours as needed for wheezing or shortness of breath.   Marland Kitchen aspirin EC 81 MG tablet Take 81 mg by mouth at bedtime.    . cholecalciferol (VITAMIN D) 1000 UNITS tablet Take 1,000 Units by mouth at bedtime.    . citalopram (CELEXA) 10 MG tablet Take 1 tablet (10 mg total) by mouth daily. 1/2 to 1 tab every evening for depression / mood. (Patient taking differently: Take 10 mg by mouth at bedtime. )   . cyanocobalamin 500 MCG tablet Take 500 mcg by mouth daily.   . cyclobenzaprine (FLEXERIL) 10 MG tablet Take 0.5 tablets (5 mg total) by mouth 2 (two) times daily as needed for muscle spasms. (Patient taking differently: Take 10 mg by mouth 2 (two) times daily as  needed for muscle spasms. Uses rarely)   . diazepam (VALIUM) 5 MG tablet Take 1 tablet po 1.5 hours pror to procedure. May repeat dose in 1 hour if needed. (Patient not taking: Reported on 02/03/2018)   . diclofenac sodium (VOLTAREN) 1 % GEL Apply topically 3 (three) times daily as needed. (Patient taking differently: Apply 1 application topically 4 (four) times daily as needed (pain). )   . diltiazem (CARDIZEM CD) 120 MG 24 hr capsule Take 120 mg by mouth at bedtime.    . docusate sodium (COLACE) 100 MG capsule Take 200 mg by mouth at bedtime as needed for moderate constipation.    Marland Kitchen EPIPEN 2-PAK 0.3 MG/0.3ML SOAJ injection use as directed for severe allergy reaction   . estradiol (ESTRACE VAGINAL) 0.1 MG/GM vaginal cream Apply a pea-sized amount to urethra at bedtime   . fenofibrate 160 MG tablet take1 tab po daily for chol (Patient taking differently: Take 160 mg by mouth every evening. )   . folic acid (FOLVITE) 258 MCG tablet Take 400 mcg by mouth daily.   Marland Kitchen levocetirizine (XYZAL) 5 MG tablet Take 1 tablet (5 mg total) by mouth daily as needed. (Patient taking differently: Take 5 mg by mouth daily as needed for allergies. )   . levothyroxine (SYNTHROID, LEVOTHROID) 50 MCG tablet Take 50 mcg by mouth daily before breakfast. Brand Name only   . Melatonin 5 MG TABS Take 1 tablet by  mouth at bedtime. For sleep    . Multiple Vitamins-Minerals (MULTIVITAMIN WITH MINERALS) tablet Take 1 tablet by mouth daily.   Marland Kitchen omeprazole (PRILOSEC) 40 MG capsule Take 40 mg by mouth 2 (two) times daily.    Marland Kitchen pyridoxine (B-6) 100 MG tablet Take 100 mg by mouth daily.   . sucralfate (CARAFATE) 1 g tablet Take 1 g by mouth 3 (three) times daily between meals.   . VENTOLIN HFA 108 (90 Base) MCG/ACT inhaler Take 2 puffs using inhaler four times a day as needed   . zinc gluconate 50 MG tablet Take 50 mg by mouth daily.   . [DISCONTINUED] acetaminophen (TYLENOL) 500 MG tablet Take 1,000 mg by mouth every 6 (six) hours as  needed.   . [DISCONTINUED] benzonatate (TESSALON) 100 MG capsule TAKE ONE TO TWO CAPSULES TWICE DAILY AS NEEDED FOR COUGH.   . [DISCONTINUED] Calcium Carbonate-Vitamin D (CALCIUM 600 + D PO) Take 1 tablet by mouth 2 (two) times daily.    . [DISCONTINUED] citalopram (CELEXA) 10 MG tablet 1/2 to 1 tab every evening for depression / mood.   . [DISCONTINUED] DiphenhydrAMINE HCl (BENADRYL ALLERGY PO) Take 2 tablets by mouth at bedtime as needed. For allergies   . [DISCONTINUED] EPINEPHrine (EPIPEN IJ) Inject as directed.   . [DISCONTINUED] estradiol (ESTRACE VAGINAL) 0.1 MG/GM vaginal cream at bedtime. Apply a pea-sized amount to urethra at bedtime   . [DISCONTINUED] HYDROmorphone (DILAUDID) 2 MG tablet    . [DISCONTINUED] levothyroxine (SYNTHROID, LEVOTHROID) 75 MCG tablet  06/29/2016: Received from: External Pharmacy  . [DISCONTINUED] Magnesium 250 MG TABS Take 500 mg by mouth daily.    . predniSONE (STERAPRED UNI-PAK 48 TAB) 10 MG (48) TBPK tablet 12 day dose pack. Take by mouth as directed for 12 days (Patient not taking: Reported on 01/24/2018)   . [DISCONTINUED] amoxicillin-clavulanate (AUGMENTIN) 875-125 MG tablet Take 1 tablet by mouth 2 (two) times daily. (Patient not taking: Reported on 01/24/2018)   . [DISCONTINUED] ondansetron (ZOFRAN) 4 MG tablet Take 1 tablet (4 mg total) by mouth every 8 (eight) hours as needed for nausea or vomiting. (Patient not taking: Reported on 01/24/2018)   . [DISCONTINUED] pantoprazole (PROTONIX) 40 MG tablet Take 40 mg by mouth 2 (two) times daily.     No facility-administered encounter medications on file as of 01/24/2018.     Surgical History: Past Surgical History:  Procedure Laterality Date  . 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 BIOPSY THYROID  2018  . bladder biopsies  2003   9 biopsies done by dr. cope.  all benign.  inflammatory process going on in bladder  . BREAST BIOPSY Right    benign  .  BREAST SURGERY Right    lumpectomy  . CATARACT EXTRACTION W/PHACO Left 12/25/2014   Procedure: CATARACT EXTRACTION PHACO AND INTRAOCULAR LENS PLACEMENT (IOC);  Surgeon: Birder Robson, MD;  Location: [facility] ORS;  Service: Ophthalmology;  Laterality: Left;  [place] 00:32 AP% 20.0 CDE 6.49  . COLONOSCOPY W/ BIOPSIES    . COLONOSCOPY W/ POLYPECTOMY  2002, 2004, 2005   adenomatous polyps removed  . COLONOSCOPY WITH PROPOFOL N/A 08/23/2017   Procedure: COLONOSCOPY WITH PROPOFOL;  Surgeon: Manya Silvas, MD;  Location: [facility] ENDOSCOPY;  Service: Endoscopy;  Laterality: N/A;  . COLONOSCOPY WITH PROPOFOL N/A 11/08/2017   Procedure: COLONOSCOPY WITH PROPOFOL;  Surgeon: Manya Silvas, MD;  Location: [facility] ENDOSCOPY;  Service: Endoscopy;  Laterality: N/A;  . CYSTOCELE REPAIR N/A 04/09/2016   Procedure: ANTERIOR REPAIR (CYSTOCELE);  Surgeon: Gae Dry, MD;  Location: [facility] ORS;  Service: Gynecology;  Laterality: N/A;  . DIAGNOSTIC LAPAROSCOPY  2008   removed both ovaries with cysts, tubes and fibroids  . DILATION AND CURETTAGE OF UTERUS  1973  . ESOPHAGOGASTRODUODENOSCOPY (EGD) WITH PROPOFOL N/A 08/23/2017   Procedure: ESOPHAGOGASTRODUODENOSCOPY (EGD) WITH PROPOFOL;  Surgeon: Manya Silvas, MD;  Location: [facility] ENDOSCOPY;  Service: Endoscopy;  Laterality: N/A;  . EYE SURGERY Bilateral 2016  . HERNIA REPAIR Right 2008   Inguinal hernia Repair, ventral hernia repair  . JOINT REPLACEMENT Right 2008   knee  . KNEE ARTHROSCOPY Right   . LUMBAR LAMINECTOMY/DECOMPRESSION MICRODISCECTOMY Right 03/21/2013   Procedure: Right Lumbar five-Sacral one Laminectomy for Synovial Cyst;  Surgeon: Faythe Ghee, MD;  Location: [facility] NEURO ORS;  Service: Neurosurgery;  Laterality: Right;  right  . OOPHORECTOMY    . TUBAL LIGATION    . UNILATERAL SALPINGECTOMY Left 04/09/2016   Procedure: UNILATERAL SALPINGECTOMY;  Surgeon: Gae Dry, MD;  Location: [facility] ORS;  Service: Gynecology;  Laterality: Left;  . UPPER GI  ENDOSCOPY  2010   with biopsy of gastric erosion  . VAGINAL HYSTERECTOMY N/A 04/09/2016   Procedure: HYSTERECTOMY VAGINAL;  Surgeon: Gae Dry, MD;  Location: [facility] ORS;  Service: Gynecology;  Laterality: N/A;  . VAGINAL HYSTERECTOMY  2017   and bladder tack    Medical History: Past Medical History:  Diagnosis Date  . Anxiety   . Arthritis    osteo  . Asthma    needs rescue inhaler few times a year  . Depression   . Dizziness    light headed spells but it has been a while  . Dysrhythmia    tachycardia.(cardizem). brady during procedures  . GERD (gastroesophageal reflux disease)    gastritis  . Headache(784.0)   . Heart murmur 2019   aortic. dr. Nehemiah Massed not worried about this  . Hyperlipidemia   . Hypertension   . Hypothyroidism   . Migraine   . Orthopnea   . Pneumonia    long time ago (greater than 5 years ago)  . PONV (postoperative nausea and vomiting)    very anxious during cataract surgery. brady during colonoscopy  . Shortness of breath    any exertion, cannot lie flat    Family History: Family History  Problem Relation Age of Onset  . Pulmonary fibrosis Mother   . Melanoma Father   . Cardiomyopathy Sister        and arrhythmia    Social History   Socioeconomic History  . Marital status: Married    Spouse name: Not on file  . Number of children: Not on file  . Years of education: Not on file  . Highest education level: Not on file  Occupational History  . Not on file  Social Needs  . Financial resource strain: Not on file  . Food insecurity:    Worry: Not on file    Inability: Not on file  . Transportation needs:    Medical: Not on file    Non-medical: Not on file  Tobacco Use  . Smoking status: Former Smoker    Types: Cigarettes  . Smokeless tobacco: Never Used  Substance and Sexual Activity  . Alcohol use: Yes    Comment: occasional wine  . Drug use: No  . Sexual activity: Not Currently  Lifestyle  . Physical activity:  Days  per week: Not on file    Minutes per session: Not on file  . Stress: Not on file  Relationships  . Social connections:    Talks on phone: Not on file    Gets together: Not on file    Attends religious service: Not on file    Active member of club or organization: Not on file    Attends meetings of clubs or organizations: Not on file    Relationship status: Not on file  . Intimate partner violence:    Fear of current or ex partner: Not on file    Emotionally abused: Not on file    Physically abused: Not on file    Forced sexual activity: Not on file  Other Topics Concern  . Not on file  Social History Narrative  . Not on file      Review of Systems  Constitutional: Positive for appetite change and fatigue. Negative for activity change, chills and unexpected weight change.  HENT: Negative for congestion, postnasal drip, rhinorrhea, sneezing and sore throat.   Eyes: Negative.  Negative for redness.  Respiratory: Negative for cough, chest tightness, shortness of breath and wheezing.   Cardiovascular: Negative for chest pain and palpitations.  Gastrointestinal: Positive for diarrhea and nausea. Negative for abdominal pain, constipation and vomiting.  Endocrine: Negative for cold intolerance, heat intolerance, polydipsia, polyphagia and polyuria.  Genitourinary: Negative for dysuria, frequency and urgency.  Musculoskeletal: Positive for arthralgias and back pain. Negative for joint swelling and neck pain.       Intermittent right shoulder pain.   Skin: Negative for rash.  Allergic/Immunologic: Negative for environmental allergies.  Neurological: Positive for weakness and numbness. Negative for tremors.  Hematological: Negative for adenopathy. Does not bruise/bleed easily.  Psychiatric/Behavioral: Positive for dysphoric mood. Negative for behavioral problems (Depression), sleep disturbance and suicidal ideas. The patient is nervous/anxious.    Today's Vitals   01/24/18 1034  BP:  140/80  Pulse: 76  Resp: 16  SpO2: 98%  Weight: 140 lb (63.5 kg)  Height: 5\' 1"  (1.549 m)     Physical Exam  Constitutional: She is oriented to person, place, and time. She appears well-developed and well-nourished. No distress.  HENT:  Head: Normocephalic and atraumatic.  Right Ear: Tympanic membrane is not bulging.  Left Ear: Tympanic membrane is not bulging.  Nose: No rhinorrhea. Right sinus exhibits no maxillary sinus tenderness and no frontal sinus tenderness. Left sinus exhibits no maxillary sinus tenderness and no frontal sinus tenderness.  Mouth/Throat: No oropharyngeal exudate or posterior oropharyngeal erythema.  Eyes: Pupils are equal, round, and reactive to light. EOM are normal.  Neck: Normal range of motion. Neck supple. No JVD present. Carotid bruit is not present. No tracheal deviation present. No thyromegaly present.  Cardiovascular: Normal rate and regular rhythm. Exam reveals no gallop and no friction rub.  Murmur heard.  Systolic murmur is present with a grade of 2/6. Pulmonary/Chest: Effort normal and breath sounds normal. No respiratory distress. She has no wheezes. She has no rales. She exhibits no tenderness.  Very mild wheezing heard throughout the lung fields.   Abdominal: Soft. Bowel sounds are normal. There is tenderness.  Musculoskeletal: Normal range of motion.  Mild tenderness of right shoulder. More severe with internal and external rotation of the right arm. No palpable abnormality or deformity noted at this time.   Lymphadenopathy:    She has no cervical adenopathy.  Neurological: She is alert and oriented to person, place, and time.  No cranial nerve deficit.  Skin: Skin is warm and dry. Capillary refill takes less than 2 seconds. She is not diaphoretic.  Psychiatric: Her speech is normal and behavior is normal. Judgment and thought content normal. Cognition and memory are normal. She exhibits a depressed mood.  Nursing note and vitals  reviewed.   Assessment/Plan: 1. Benign essential HTN Stable. Continue bp medication as prescribed.   2. Female stress incontinence Continue estrace vaginal cream, applied externally, two to three times per week.  - estradiol (ESTRACE VAGINAL) 0.1 MG/GM vaginal cream; Apply a pea-sized amount to urethra at bedtime  Dispense: 42.5 g; Refill: 5  3. Pain in joint of right shoulder Currently seeing orthopedics for continued evaluation and treatment.    4. Nausea BRAT diet reviewed and recommended. Zofran as needed and as prescribed. Regular visits with GI as scheduled.   5. Abdominal aortic atherosclerosis ([place]) Noted on recent  Imaging of the abdomen. Will get carotid doppler for further evaluation.  - VAS US CAROTID; Future  6. Episode of recurrent major depressive disorder, unspecified depression episode severity ([place]) Improved and stable on low dose citalopram. May increase dose to 10mg  daily. New prescription sent to her pharamcy  - citalopram (CELEXA) 10 MG tablet; Take 1 tablet (10 mg total) by mouth daily. 1/2 to 1 tab every evening for depression / mood. (Patient taking differently: Take 10 mg by mouth at bedtime. )  Dispense: 30 tablet; Refill: 3  7. Screening for breast cancer - MM DIGITAL SCREENING BILATERAL; Future  General Counseling: Hedi verbalizes understanding of the findings of todays visit and agrees with plan of treatment. I have discussed any further diagnostic evaluation that may be needed or ordered today. We also reviewed her medications today. she has been encouraged to call the office with any questions or concerns that should arise related to todays visit.    Counseling:  Hypertension Counseling:   The following hypertensive lifestyle modification were recommended and discussed:  1. Limiting alcohol intake to less than 1 oz/day of ethanol:(24 oz of beer or 8 oz of wine or 2 oz of 100-proof whiskey). 2. Take baby ASA 81 mg daily. 3. Importance of regular  aerobic exercise and losing weight. 4. Reduce dietary saturated fat and cholesterol intake for overall cardiovascular health. 5. Maintaining adequate dietary potassium, calcium, and magnesium intake. 6. Regular monitoring of the blood pressure. 7. Reduce sodium intake to less than 100 mmol/day (less than 2.3 gm of sodium or less than 6 gm of sodium choride)   This patient was seen by Leretha Pol, FNP- C in Collaboration with Dr Lavera Guise as a part of collaborative care agreement   Orders Placed This Encounter  Procedures  . MM DIGITAL SCREENING BILATERAL    Meds ordered this encounter  Medications  . citalopram (CELEXA) 10 MG tablet    Sig: Take 1 tablet (10 mg total) by mouth daily. 1/2 to 1 tab every evening for depression / mood.    Dispense:  30 tablet    Refill:  3    Order Specific Question:   Supervising Provider    Answer:   Lavera Guise [[id number]]  . estradiol (ESTRACE VAGINAL) 0.1 MG/GM vaginal cream    Sig: Apply a pea-sized amount to urethra at bedtime    Dispense:  42.5 g    Refill:  5    Order Specific Question:   Supervising Provider    Answer:   Lavera Guise [place]    Time  spent: 61 Minutes      Dr Lavera Guise Internal medicine

## 2018-01-25 ENCOUNTER — Telehealth: Payer: Self-pay

## 2018-01-25 NOTE — Telephone Encounter (Signed)
Called pt to let her know that she can call [facility] to schedule a mammogram appointment at her earliest convenience.

## 2018-01-31 DIAGNOSIS — M7581 Other shoulder lesions, right shoulder: Secondary | ICD-10-CM | POA: Insufficient documentation

## 2018-01-31 DIAGNOSIS — M75121 Complete rotator cuff tear or rupture of right shoulder, not specified as traumatic: Secondary | ICD-10-CM | POA: Diagnosis not present

## 2018-01-31 DIAGNOSIS — D1721 Benign lipomatous neoplasm of skin and subcutaneous tissue of right arm: Secondary | ICD-10-CM | POA: Diagnosis not present

## 2018-02-04 DIAGNOSIS — M7062 Trochanteric bursitis, left hip: Secondary | ICD-10-CM | POA: Diagnosis not present

## 2018-02-07 ENCOUNTER — Ambulatory Visit (INDEPENDENT_AMBULATORY_CARE_PROVIDER_SITE_OTHER): Payer: Medicare Other

## 2018-02-07 DIAGNOSIS — J301 Allergic rhinitis due to pollen: Secondary | ICD-10-CM | POA: Diagnosis not present

## 2018-02-11 ENCOUNTER — Other Ambulatory Visit: Payer: Self-pay

## 2018-02-11 ENCOUNTER — Inpatient Hospital Stay: Admission: RE | Admit: 2018-02-11 | Payer: Medicare Other | Source: Ambulatory Visit

## 2018-02-11 ENCOUNTER — Encounter
Admission: RE | Admit: 2018-02-11 | Discharge: 2018-02-11 | Disposition: A | Discharge status: Home / Self Care | Payer: Medicare Other | Source: Ambulatory Visit | Attending: Surgery | Admitting: Surgery

## 2018-02-11 ENCOUNTER — Ambulatory Visit (INDEPENDENT_AMBULATORY_CARE_PROVIDER_SITE_OTHER): Payer: Medicare Other

## 2018-02-11 DIAGNOSIS — I10 Essential (primary) hypertension: Secondary | ICD-10-CM | POA: Diagnosis not present

## 2018-02-11 DIAGNOSIS — Z0183 Encounter for blood typing: Secondary | ICD-10-CM | POA: Insufficient documentation

## 2018-02-11 DIAGNOSIS — Z01818 Encounter for other preprocedural examination: Secondary | ICD-10-CM | POA: Insufficient documentation

## 2018-02-11 DIAGNOSIS — Z01812 Encounter for preprocedural laboratory examination: Secondary | ICD-10-CM | POA: Diagnosis not present

## 2018-02-11 DIAGNOSIS — I6523 Occlusion and stenosis of bilateral carotid arteries: Secondary | ICD-10-CM

## 2018-02-11 DIAGNOSIS — R9431 Abnormal electrocardiogram [ECG] [EKG]: Secondary | ICD-10-CM | POA: Insufficient documentation

## 2018-02-11 DIAGNOSIS — I700 Atherosclerosis of aorta: Secondary | ICD-10-CM

## 2018-02-11 HISTORY — DX: Hypothyroidism, unspecified: E03.9

## 2018-02-11 LAB — COMPREHENSIVE METABOLIC PANEL
ALT: 25 U/L (ref 0–44)
AST: 27 U/L (ref 15–41)
Albumin: 4.8 g/dL (ref 3.5–5.0)
Alkaline Phosphatase: 56 U/L (ref 38–126)
Anion gap: 12 (ref 5–15)
BILIRUBIN TOTAL: 0.4 mg/dL (ref 0.3–1.2)
BUN: 21 mg/dL (ref 8–23)
CHLORIDE: 99 mmol/L (ref 98–111)
CO2: 27 mmol/L (ref 22–32)
CREATININE: 0.82 mg/dL (ref 0.44–1.00)
Calcium: 9.7 mg/dL (ref 8.9–10.3)
GFR calc non Af Amer: >60 mL/min (ref >60)
Glucose, Bld: 97 mg/dL (ref 70–99)
Potassium: 3.8 mmol/L (ref 3.5–5.1)
Sodium: 138 mmol/L (ref 135–145)
Total Protein: 7.7 g/dL (ref 6.5–8.1)

## 2018-02-11 LAB — CBC WITH DIFFERENTIAL/PLATELET
BASOS PCT: 0 %
Basophils Absolute: 0 10*3/uL (ref 0–0.1)
EOS ABS: 0.1 10*3/uL (ref 0–0.7)
EOS PCT: 2 %
HCT: 40.1 % (ref 35.0–47.0)
HEMOGLOBIN: 13 g/dL (ref 12.0–16.0)
LYMPHS ABS: 1.4 10*3/uL (ref 1.0–3.6)
Lymphocytes Relative: 17 %
MCH: 27.5 pg (ref 26.0–34.0)
MCHC: 32.5 g/dL (ref 32.0–36.0)
MCV: 84.6 fL (ref 80.0–100.0)
Monocytes Absolute: 0.9 10*3/uL (ref 0.2–0.9)
Monocytes Relative: 11 %
Neutro Abs: 5.7 10*3/uL (ref 1.4–6.5)
Neutrophils Relative %: 70 %
PLATELETS: 379 10*3/uL (ref 150–440)
RBC: 4.74 MIL/uL (ref 3.80–5.20)
RDW: 14.9 % — ABNORMAL HIGH (ref 11.5–14.5)
WBC: 8.2 10*3/uL (ref 3.6–11.0)

## 2018-02-11 LAB — TYPE AND SCREEN
ABO/RH(D): A POS
ANTIBODY SCREEN: NEGATIVE

## 2018-02-11 LAB — URINALYSIS, ROUTINE W REFLEX MICROSCOPIC
Bilirubin Urine: NEGATIVE
Glucose, UA: NEGATIVE mg/dL
Hgb urine dipstick: NEGATIVE
Ketones, ur: NEGATIVE mg/dL
LEUKOCYTES UA: NEGATIVE
NITRITE: NEGATIVE
PROTEIN: NEGATIVE mg/dL
Specific Gravity, Urine: 1.003 — ABNORMAL LOW (ref 1.005–1.030)
pH: 7 (ref 5.0–8.0)

## 2018-02-11 LAB — PROTIME-INR
INR: 0.92
Prothrombin Time: 12.3 seconds (ref 11.4–15.2)

## 2018-02-11 LAB — SURGICAL PCR SCREEN
MRSA, PCR: NEGATIVE
Staphylococcus aureus: NEGATIVE

## 2018-02-11 NOTE — Patient Instructions (Signed)
Your procedure is scheduled on: July 16TH, Tuesday  Report to [facility].  DO NOT REPORT OR CHECK IN ON FIRST FLOOR  To find out your arrival time please call [phone] between 1PM - 3PM on Monday, July 15TH  Remember: Instructions that are not followed completely may result in serious medical risk,  up to and including death, or upon the discretion of your surgeon and anesthesiologist your  surgery may need to be rescheduled.     _X__ 1. Do not eat food after midnight the night before your procedure.                 No gum chewing or hard candies. NOTHING SOLID IN YOUR MOUTH AFTER MIDNIGHT                 You may drink clear liquids up to 2 hours before you are scheduled to arrive for your surgery-                  DO not drink clear liquids within 2 hours of the start of your surgery.                  Clear Liquids include:  water, apple juice without pulp, clear carbohydrate                 drink such as Clearfast of Gatorade, Black Coffee or Tea (Do not add                 anything to coffee or tea).  __X__2.  On the morning of surgery brush your teeth with toothpaste and water, you                may rinse your mouth with mouthwash if you wish.                  Do not swallow any toothpaste of mouthwash.     _X__ 3.  No Alcohol for 24 hours before or after surgery.   _X__ 4.  Do Not Smoke or use e-cigarettes For 24 Hours Prior to Your Surgery.                 Do not use any chewable tobacco products for at least 6 hours prior to                 surgery.  ____  5.  Bring all medications with you on the day of surgery if instructed.   _X___  6.  Notify your doctor if there is any change in your medical condition      (cold, fever, infections).     Do not wear jewelry, make-up, hairpins, clips or nail polish. Do not wear lotions, powders, or perfumes. You may wear deodorant. Do not shave 48 hours prior to surgery. Men may  shave face and neck. Do not bring valuables to the hospital.    [facility] is not responsible for any belongings or valuables.  Contacts, dentures or bridgework may not be worn into surgery. Leave your suitcase in the car. After surgery it may be brought to your room. For patients admitted to the hospital, discharge time is determined by your treatment team.   Patients discharged the day of surgery will not be allowed to drive home.   Please read over the following fact sheets that you were given:   PREPARING FOR SURGERY  ADVANCED DIRECTIVES   ____ Take these medicines the morning of surgery with A SIP OF WATER:    1. ALBUTEROL INHALER, bring to the hospital with you  2. PRILOSEC  3. SYNTHROID  4. XYZAL, IF NEEDED  5. NASAL SPRAY  6. EYE DROP              ___ Fleet Enema (as directed)   _X___ Use CHG Soap as directed  _X___ Use inhalers on the day of surgery  _X___ Stop ASPIRIN PRODUCTS AS OF July 9TH             __X__ Stop Anti-inflammatories AS OF July 9TH   __X__ Stop supplements until after surgery.  STOP ALL SUPPLEMENTS AS OF July 9TH  ____ Bring C-Pap to the hospital.   YOU MAY CONTINUE TO TAKE ALL REGULARLY PRESCRIBED NIGHTTIME MEDICINES  CONTINUE TO TAKE CARAFATE AND STOOL SOFTENER BUT NOT ON MORNING OF SURGERY.  YOU MAY CONTINUE TO TAKE TYLENOL IF IT IS NEEDED.  DO NOT GO OVER 4000MG  IN 24 HOURS.  WEAR A LOOSE FITTING SHIRT FOR DISCHARGE HOME.

## 2018-02-12 LAB — URINE CULTURE

## 2018-02-13 DIAGNOSIS — M25511 Pain in right shoulder: Secondary | ICD-10-CM | POA: Insufficient documentation

## 2018-02-13 DIAGNOSIS — F339 Major depressive disorder, recurrent, unspecified: Secondary | ICD-10-CM | POA: Insufficient documentation

## 2018-02-13 DIAGNOSIS — I700 Atherosclerosis of aorta: Secondary | ICD-10-CM | POA: Insufficient documentation

## 2018-02-13 DIAGNOSIS — Z0001 Encounter for general adult medical examination with abnormal findings: Secondary | ICD-10-CM | POA: Insufficient documentation

## 2018-02-14 NOTE — Pre-Procedure Instructions (Signed)
EKG FAXED TO DR Nehemiah Massed TO REVIEW

## 2018-02-15 ENCOUNTER — Other Ambulatory Visit: Payer: Self-pay

## 2018-02-15 MED ORDER — DICLOFENAC SODIUM 1 % TD GEL: 1.0000 "application " | Freq: Four times a day (QID) | TRANSDERMAL | 1 refills | Status: DC | PRN

## 2018-02-15 NOTE — Progress Notes (Signed)
Pt called to update PPI change after PAT appt.  No longer taking Omeprazole.  PO Pantoprazole 40mg  BID.  PTA med list updated in [facility] to reflect change.

## 2018-02-18 DIAGNOSIS — R9431 Abnormal electrocardiogram [ECG] [EKG]: Secondary | ICD-10-CM | POA: Insufficient documentation

## 2018-02-18 DIAGNOSIS — E782 Mixed hyperlipidemia: Secondary | ICD-10-CM | POA: Diagnosis not present

## 2018-02-18 DIAGNOSIS — I10 Essential (primary) hypertension: Secondary | ICD-10-CM | POA: Diagnosis not present

## 2018-02-21 ENCOUNTER — Ambulatory Visit (INDEPENDENT_AMBULATORY_CARE_PROVIDER_SITE_OTHER): Payer: Medicare Other

## 2018-02-21 DIAGNOSIS — J301 Allergic rhinitis due to pollen: Secondary | ICD-10-CM

## 2018-02-21 MED ORDER — CLINDAMYCIN PHOSPHATE 900 MG/50ML IV SOLN
900.0000 mg | Freq: Once | INTRAVENOUS | Status: AC
  Administered 2018-02-22: 900 mg via INTRAVENOUS

## 2018-02-22 ENCOUNTER — Encounter: Payer: Self-pay | Admitting: Emergency Medicine

## 2018-02-22 ENCOUNTER — Other Ambulatory Visit: Payer: Self-pay

## 2018-02-22 ENCOUNTER — Inpatient Hospital Stay: Payer: Medicare Other

## 2018-02-22 ENCOUNTER — Encounter
Admission: RE | Disposition: A | Discharge status: Home Health | Payer: Self-pay | Source: Home / Self Care | Attending: Surgery

## 2018-02-22 ENCOUNTER — Inpatient Hospital Stay
Admission: RE | Admit: 2018-02-22 | Discharge: 2018-02-24 | DRG: 465 | Disposition: A | Discharge status: Home Health | Payer: Medicare Other | Attending: Surgery | Admitting: Surgery

## 2018-02-22 DIAGNOSIS — Z885 Allergy status to narcotic agent status: Secondary | ICD-10-CM

## 2018-02-22 DIAGNOSIS — Z86010 Personal history of colonic polyps: Secondary | ICD-10-CM | POA: Diagnosis not present

## 2018-02-22 DIAGNOSIS — I10 Essential (primary) hypertension: Secondary | ICD-10-CM | POA: Diagnosis present

## 2018-02-22 DIAGNOSIS — D1721 Benign lipomatous neoplasm of skin and subcutaneous tissue of right arm: Secondary | ICD-10-CM | POA: Diagnosis present

## 2018-02-22 DIAGNOSIS — Z882 Allergy status to sulfonamides status: Secondary | ICD-10-CM

## 2018-02-22 DIAGNOSIS — K219 Gastro-esophageal reflux disease without esophagitis: Secondary | ICD-10-CM | POA: Diagnosis present

## 2018-02-22 DIAGNOSIS — Z8349 Family history of other endocrine, nutritional and metabolic diseases: Secondary | ICD-10-CM | POA: Diagnosis not present

## 2018-02-22 DIAGNOSIS — Z7982 Long term (current) use of aspirin: Secondary | ICD-10-CM

## 2018-02-22 DIAGNOSIS — Z8042 Family history of malignant neoplasm of prostate: Secondary | ICD-10-CM

## 2018-02-22 DIAGNOSIS — Z87891 Personal history of nicotine dependence: Secondary | ICD-10-CM

## 2018-02-22 DIAGNOSIS — E782 Mixed hyperlipidemia: Secondary | ICD-10-CM | POA: Diagnosis present

## 2018-02-22 DIAGNOSIS — Z8269 Family history of other diseases of the musculoskeletal system and connective tissue: Secondary | ICD-10-CM

## 2018-02-22 DIAGNOSIS — M47816 Spondylosis without myelopathy or radiculopathy, lumbar region: Secondary | ICD-10-CM | POA: Diagnosis present

## 2018-02-22 DIAGNOSIS — Z9842 Cataract extraction status, left eye: Secondary | ICD-10-CM | POA: Diagnosis not present

## 2018-02-22 DIAGNOSIS — Z8249 Family history of ischemic heart disease and other diseases of the circulatory system: Secondary | ICD-10-CM

## 2018-02-22 DIAGNOSIS — Z90710 Acquired absence of both cervix and uterus: Secondary | ICD-10-CM | POA: Diagnosis not present

## 2018-02-22 DIAGNOSIS — M7591 Shoulder lesion, unspecified, right shoulder: Secondary | ICD-10-CM | POA: Diagnosis present

## 2018-02-22 DIAGNOSIS — M75121 Complete rotator cuff tear or rupture of right shoulder, not specified as traumatic: Secondary | ICD-10-CM | POA: Diagnosis present

## 2018-02-22 DIAGNOSIS — M25511 Pain in right shoulder: Secondary | ICD-10-CM | POA: Diagnosis not present

## 2018-02-22 DIAGNOSIS — J45909 Unspecified asthma, uncomplicated: Secondary | ICD-10-CM | POA: Diagnosis present

## 2018-02-22 DIAGNOSIS — E876 Hypokalemia: Secondary | ICD-10-CM | POA: Diagnosis not present

## 2018-02-22 DIAGNOSIS — Z79890 Hormone replacement therapy (postmenopausal): Secondary | ICD-10-CM

## 2018-02-22 DIAGNOSIS — Z90722 Acquired absence of ovaries, bilateral: Secondary | ICD-10-CM

## 2018-02-22 DIAGNOSIS — R011 Cardiac murmur, unspecified: Secondary | ICD-10-CM | POA: Diagnosis present

## 2018-02-22 DIAGNOSIS — Z8262 Family history of osteoporosis: Secondary | ICD-10-CM

## 2018-02-22 DIAGNOSIS — Z471 Aftercare following joint replacement surgery: Secondary | ICD-10-CM | POA: Diagnosis not present

## 2018-02-22 DIAGNOSIS — G8918 Other acute postprocedural pain: Secondary | ICD-10-CM | POA: Diagnosis not present

## 2018-02-22 DIAGNOSIS — Z820 Family history of epilepsy and other diseases of the nervous system: Secondary | ICD-10-CM

## 2018-02-22 DIAGNOSIS — Z96651 Presence of right artificial knee joint: Secondary | ICD-10-CM | POA: Diagnosis present

## 2018-02-22 DIAGNOSIS — M1712 Unilateral primary osteoarthritis, left knee: Secondary | ICD-10-CM | POA: Diagnosis present

## 2018-02-22 DIAGNOSIS — M19011 Primary osteoarthritis, right shoulder: Secondary | ICD-10-CM | POA: Diagnosis present

## 2018-02-22 DIAGNOSIS — E039 Hypothyroidism, unspecified: Secondary | ICD-10-CM | POA: Diagnosis present

## 2018-02-22 DIAGNOSIS — Z79899 Other long term (current) drug therapy: Secondary | ICD-10-CM

## 2018-02-22 DIAGNOSIS — Z808 Family history of malignant neoplasm of other organs or systems: Secondary | ICD-10-CM

## 2018-02-22 DIAGNOSIS — F329 Major depressive disorder, single episode, unspecified: Secondary | ICD-10-CM | POA: Diagnosis present

## 2018-02-22 DIAGNOSIS — Z823 Family history of stroke: Secondary | ICD-10-CM

## 2018-02-22 DIAGNOSIS — Z96611 Presence of right artificial shoulder joint: Secondary | ICD-10-CM | POA: Diagnosis not present

## 2018-02-22 DIAGNOSIS — M75101 Unspecified rotator cuff tear or rupture of right shoulder, not specified as traumatic: Secondary | ICD-10-CM | POA: Diagnosis not present

## 2018-02-22 DIAGNOSIS — M7581 Other shoulder lesions, right shoulder: Secondary | ICD-10-CM | POA: Diagnosis not present

## 2018-02-22 DIAGNOSIS — Z886 Allergy status to analgesic agent status: Secondary | ICD-10-CM

## 2018-02-22 DIAGNOSIS — Z9841 Cataract extraction status, right eye: Secondary | ICD-10-CM

## 2018-02-22 DIAGNOSIS — Z825 Family history of asthma and other chronic lower respiratory diseases: Secondary | ICD-10-CM

## 2018-02-22 DIAGNOSIS — M12811 Other specific arthropathies, not elsewhere classified, right shoulder: Secondary | ICD-10-CM | POA: Diagnosis not present

## 2018-02-22 DIAGNOSIS — M810 Age-related osteoporosis without current pathological fracture: Secondary | ICD-10-CM | POA: Diagnosis present

## 2018-02-22 DIAGNOSIS — Z881 Allergy status to other antibiotic agents status: Secondary | ICD-10-CM

## 2018-02-22 HISTORY — PX: REVERSE SHOULDER ARTHROPLASTY: SHX5054

## 2018-02-22 SURGERY — ARTHROPLASTY, SHOULDER, TOTAL, REVERSE
Anesthesia: General | Site: Shoulder | Laterality: Right | Wound class: Clean

## 2018-02-22 MED ORDER — CYANOCOBALAMIN 500 MCG PO TABS
500.0000 ug | ORAL_TABLET | Freq: Every day | ORAL | Status: DC
  Administered 2018-02-24: 500 ug via ORAL
  Filled 2018-02-22 (×3): qty 1

## 2018-02-22 MED ORDER — HYDROCODONE-ACETAMINOPHEN 7.5-325 MG PO TABS
1.0000 | ORAL_TABLET | ORAL | Status: DC | PRN
  Administered 2018-02-23 – 2018-02-24 (×7): 1 via ORAL
  Filled 2018-02-22 (×7): qty 1

## 2018-02-22 MED ORDER — ADULT MULTIVITAMIN W/MINERALS CH
1.0000 | ORAL_TABLET | Freq: Every day | ORAL | Status: DC
  Administered 2018-02-23 – 2018-02-24 (×2): 1 via ORAL
  Filled 2018-02-22 (×2): qty 1

## 2018-02-22 MED ORDER — BUPIVACAINE LIPOSOME 1.3 % IJ SUSP
INTRAMUSCULAR | Status: AC
  Filled 2018-02-22: qty 20

## 2018-02-22 MED ORDER — ROPIVACAINE HCL 5 MG/ML IJ SOLN
INTRAMUSCULAR | Status: AC
  Filled 2018-02-22: qty 30

## 2018-02-22 MED ORDER — DOCUSATE SODIUM 100 MG PO CAPS
100.0000 mg | ORAL_CAPSULE | Freq: Two times a day (BID) | ORAL | Status: DC
  Administered 2018-02-22 – 2018-02-24 (×4): 100 mg via ORAL
  Filled 2018-02-22 (×4): qty 1

## 2018-02-22 MED ORDER — SUGAMMADEX SODIUM 200 MG/2ML IV SOLN
INTRAVENOUS | Status: DC | PRN
  Administered 2018-02-22: 125 mg via INTRAVENOUS

## 2018-02-22 MED ORDER — PHENYLEPHRINE HCL 10 MG PO TABS: 10.0000 mg | ORAL_TABLET | ORAL | Status: DC | PRN

## 2018-02-22 MED ORDER — DILTIAZEM HCL ER COATED BEADS 120 MG PO CP24
120.0000 mg | ORAL_CAPSULE | Freq: Every day | ORAL | Status: DC
  Administered 2018-02-22 – 2018-02-23 (×2): 120 mg via ORAL
  Filled 2018-02-22 (×3): qty 1

## 2018-02-22 MED ORDER — ROCURONIUM BROMIDE 100 MG/10ML IV SOLN
INTRAVENOUS | Status: DC | PRN
  Administered 2018-02-22: 40 mg via INTRAVENOUS
  Administered 2018-02-22: 10 mg via INTRAVENOUS

## 2018-02-22 MED ORDER — MIDAZOLAM HCL 2 MG/2ML IJ SOLN
INTRAMUSCULAR | Status: AC
  Filled 2018-02-22: qty 2

## 2018-02-22 MED ORDER — LEVOCETIRIZINE DIHYDROCHLORIDE 5 MG PO TABS: 5.0000 mg | ORAL_TABLET | Freq: Every day | ORAL | Status: DC | PRN

## 2018-02-22 MED ORDER — MIDAZOLAM HCL 2 MG/2ML IJ SOLN
INTRAMUSCULAR | Status: AC
  Administered 2018-02-22: 1 mg via INTRAVENOUS
  Filled 2018-02-22: qty 2

## 2018-02-22 MED ORDER — VITAMIN D3 25 MCG (1000 UNIT) PO TABS
1000.0000 [IU] | ORAL_TABLET | Freq: Every day | ORAL | Status: DC
  Administered 2018-02-23: 1000 [IU] via ORAL
  Filled 2018-02-22 (×4): qty 1

## 2018-02-22 MED ORDER — PROPOFOL 10 MG/ML IV BOLUS
INTRAVENOUS | Status: DC | PRN
  Administered 2018-02-22: 120 mg via INTRAVENOUS
  Administered 2018-02-22: 30 mg via INTRAVENOUS

## 2018-02-22 MED ORDER — GUAIFENESIN 100 MG/5ML PO SOLN: 20.0000 mL | ORAL | Status: DC | PRN

## 2018-02-22 MED ORDER — CALCIUM CARBONATE-VITAMIN D 500-200 MG-UNIT PO TABS
1.0000 | ORAL_TABLET | Freq: Two times a day (BID) | ORAL | Status: DC
  Administered 2018-02-23 – 2018-02-24 (×3): 1 via ORAL
  Filled 2018-02-22 (×3): qty 1

## 2018-02-22 MED ORDER — FENTANYL CITRATE (PF) 100 MCG/2ML IJ SOLN
INTRAMUSCULAR | Status: DC | PRN
  Administered 2018-02-22 (×2): 50 ug via INTRAVENOUS

## 2018-02-22 MED ORDER — FOLIC ACID 1 MG PO TABS
500.0000 ug | ORAL_TABLET | Freq: Every day | ORAL | Status: DC
  Administered 2018-02-23 – 2018-02-24 (×2): 0.5 mg via ORAL
  Filled 2018-02-22 (×2): qty 1

## 2018-02-22 MED ORDER — MAGNESIUM HYDROXIDE 400 MG/5ML PO SUSP
30.0000 mL | Freq: Every day | ORAL | Status: DC | PRN
  Administered 2018-02-23: 30 mL via ORAL
  Filled 2018-02-22: qty 30

## 2018-02-22 MED ORDER — PANTOPRAZOLE SODIUM 40 MG PO TBEC
40.0000 mg | DELAYED_RELEASE_TABLET | Freq: Two times a day (BID) | ORAL | Status: DC
  Administered 2018-02-22 – 2018-02-24 (×4): 40 mg via ORAL
  Filled 2018-02-22 (×4): qty 1

## 2018-02-22 MED ORDER — EPINEPHRINE PF 1 MG/ML IJ SOLN
INTRAMUSCULAR | Status: AC
  Filled 2018-02-22: qty 1

## 2018-02-22 MED ORDER — DIPHENHYDRAMINE HCL 12.5 MG/5ML PO ELIX: 12.5000 mg | ORAL_SOLUTION | ORAL | Status: DC | PRN

## 2018-02-22 MED ORDER — DIPHENHYDRAMINE HCL 50 MG/ML IJ SOLN
INTRAMUSCULAR | Status: AC
  Filled 2018-02-22: qty 1

## 2018-02-22 MED ORDER — PROPOFOL 500 MG/50ML IV EMUL
INTRAVENOUS | Status: AC
  Filled 2018-02-22: qty 50

## 2018-02-22 MED ORDER — BUPIVACAINE HCL (PF) 0.5 % IJ SOLN
INTRAMUSCULAR | Status: AC
  Filled 2018-02-22: qty 10

## 2018-02-22 MED ORDER — LIDOCAINE HCL (PF) 2 % IJ SOLN
INTRAMUSCULAR | Status: AC
  Filled 2018-02-22: qty 10

## 2018-02-22 MED ORDER — LIDOCAINE HCL (PF) 1 % IJ SOLN
INTRAMUSCULAR | Status: AC
  Filled 2018-02-22: qty 5

## 2018-02-22 MED ORDER — SUGAMMADEX SODIUM 200 MG/2ML IV SOLN
INTRAVENOUS | Status: AC
  Filled 2018-02-22: qty 2

## 2018-02-22 MED ORDER — ACETAMINOPHEN 325 MG PO TABS: 325.0000 mg | ORAL_TABLET | Freq: Four times a day (QID) | ORAL | Status: DC | PRN

## 2018-02-22 MED ORDER — ASPIRIN EC 81 MG PO TBEC
81.0000 mg | DELAYED_RELEASE_TABLET | Freq: Every day | ORAL | Status: DC
  Administered 2018-02-22 – 2018-02-23 (×2): 81 mg via ORAL
  Filled 2018-02-22 (×2): qty 1

## 2018-02-22 MED ORDER — NEOMYCIN-POLYMYXIN B GU 40-200000 IR SOLN
Status: DC | PRN
  Administered 2018-02-22: 14 mL

## 2018-02-22 MED ORDER — ROPIVACAINE HCL 5 MG/ML IJ SOLN
INTRAMUSCULAR | Status: DC | PRN
  Administered 2018-02-22: 30 mL via PERINEURAL

## 2018-02-22 MED ORDER — CEFAZOLIN SODIUM-DEXTROSE 2-4 GM/100ML-% IV SOLN
2.0000 g | Freq: Four times a day (QID) | INTRAVENOUS | Status: AC
  Administered 2018-02-22 – 2018-02-23 (×3): 2 g via INTRAVENOUS
  Filled 2018-02-22 (×3): qty 100

## 2018-02-22 MED ORDER — LIDOCAINE HCL (PF) 0.5 % IJ SOLN: INTRAMUSCULAR | Status: DC | PRN

## 2018-02-22 MED ORDER — POLYVINYL ALCOHOL 1.4 % OP SOLN: 1.0000 [drp] | Freq: Every day | OPHTHALMIC | Status: DC | PRN

## 2018-02-22 MED ORDER — KETOTIFEN FUMARATE 0.025 % OP SOLN: 1.0000 [drp] | Freq: Two times a day (BID) | OPHTHALMIC | Status: DC | PRN

## 2018-02-22 MED ORDER — DEXAMETHASONE SODIUM PHOSPHATE 10 MG/ML IJ SOLN
INTRAMUSCULAR | Status: AC
  Filled 2018-02-22: qty 1

## 2018-02-22 MED ORDER — FENTANYL CITRATE (PF) 100 MCG/2ML IJ SOLN
50.0000 ug | Freq: Once | INTRAMUSCULAR | Status: AC
  Administered 2018-02-22: 50 ug via INTRAVENOUS

## 2018-02-22 MED ORDER — MIDAZOLAM HCL 2 MG/2ML IJ SOLN
INTRAMUSCULAR | Status: DC | PRN
  Administered 2018-02-22: 1 mg via INTRAVENOUS

## 2018-02-22 MED ORDER — LEVOTHYROXINE SODIUM 50 MCG PO TABS
50.0000 ug | ORAL_TABLET | Freq: Every day | ORAL | Status: DC
  Administered 2018-02-23 – 2018-02-24 (×2): 50 ug via ORAL
  Filled 2018-02-22 (×2): qty 1

## 2018-02-22 MED ORDER — FENOFIBRATE 160 MG PO TABS
160.0000 mg | ORAL_TABLET | Freq: Every evening | ORAL | Status: DC
  Administered 2018-02-22 – 2018-02-23 (×2): 160 mg via ORAL
  Filled 2018-02-22 (×3): qty 1

## 2018-02-22 MED ORDER — ENOXAPARIN SODIUM 40 MG/0.4ML ~~LOC~~ SOLN
40.0000 mg | SUBCUTANEOUS | Status: DC
  Administered 2018-02-23 – 2018-02-24 (×2): 40 mg via SUBCUTANEOUS
  Filled 2018-02-22 (×2): qty 0.4

## 2018-02-22 MED ORDER — LACTATED RINGERS IV SOLN
INTRAVENOUS | Status: DC
  Administered 2018-02-22 (×2): via INTRAVENOUS

## 2018-02-22 MED ORDER — SODIUM CHLORIDE 0.9 % IV SOLN
INTRAVENOUS | Status: DC
  Administered 2018-02-22: 16:00:00 via INTRAVENOUS

## 2018-02-22 MED ORDER — BUPIVACAINE-EPINEPHRINE (PF) 0.5% -1:200000 IJ SOLN
INTRAMUSCULAR | Status: DC | PRN
  Administered 2018-02-22: 30 mL via PERINEURAL

## 2018-02-22 MED ORDER — NEOMYCIN-POLYMYXIN B GU 40-200000 IR SOLN
Status: AC
  Filled 2018-02-22: qty 2

## 2018-02-22 MED ORDER — CLINDAMYCIN PHOSPHATE 900 MG/50ML IV SOLN
INTRAVENOUS | Status: AC
  Filled 2018-02-22: qty 50

## 2018-02-22 MED ORDER — PHENYLEPHRINE HCL 10 MG/ML IJ SOLN
INTRAMUSCULAR | Status: AC
  Filled 2018-02-22: qty 1

## 2018-02-22 MED ORDER — LORATADINE 10 MG PO TABS
10.0000 mg | ORAL_TABLET | Freq: Every day | ORAL | Status: DC | PRN
  Filled 2018-02-22: qty 1

## 2018-02-22 MED ORDER — SUCRALFATE 1 G PO TABS
1.0000 g | ORAL_TABLET | Freq: Three times a day (TID) | ORAL | Status: DC
  Administered 2018-02-22 – 2018-02-24 (×5): 1 g via ORAL
  Filled 2018-02-22 (×5): qty 1

## 2018-02-22 MED ORDER — BUPIVACAINE-EPINEPHRINE (PF) 0.5% -1:200000 IJ SOLN
INTRAMUSCULAR | Status: AC
  Filled 2018-02-22: qty 30

## 2018-02-22 MED ORDER — DEXAMETHASONE SODIUM PHOSPHATE 10 MG/ML IJ SOLN
INTRAMUSCULAR | Status: DC | PRN
  Administered 2018-02-22: 10 mg via INTRAVENOUS

## 2018-02-22 MED ORDER — SODIUM CHLORIDE 0.9 % IV SOLN
INTRAVENOUS | Status: DC | PRN
  Administered 2018-02-22: 20 ug/min via INTRAVENOUS

## 2018-02-22 MED ORDER — ZINC SULFATE 220 (50 ZN) MG PO CAPS
220.0000 mg | ORAL_CAPSULE | Freq: Every day | ORAL | Status: DC
  Administered 2018-02-23 – 2018-02-24 (×2): 220 mg via ORAL
  Filled 2018-02-22 (×3): qty 1

## 2018-02-22 MED ORDER — CYCLOBENZAPRINE HCL 10 MG PO TABS
5.0000 mg | ORAL_TABLET | Freq: Two times a day (BID) | ORAL | Status: DC | PRN
  Administered 2018-02-23: 5 mg via ORAL
  Filled 2018-02-22: qty 1

## 2018-02-22 MED ORDER — METOCLOPRAMIDE HCL 5 MG/ML IJ SOLN: 5.0000 mg | Freq: Three times a day (TID) | INTRAMUSCULAR | Status: DC | PRN

## 2018-02-22 MED ORDER — GUAIFENESIN ER 600 MG PO TB12: 600.0000 mg | ORAL_TABLET | Freq: Every evening | ORAL | Status: DC | PRN

## 2018-02-22 MED ORDER — MELATONIN 5 MG PO TABS
1.0000 | ORAL_TABLET | Freq: Every day | ORAL | Status: DC
  Administered 2018-02-22 – 2018-02-23 (×2): 5 mg via ORAL
  Filled 2018-02-22 (×3): qty 1

## 2018-02-22 MED ORDER — ONDANSETRON HCL 4 MG/2ML IJ SOLN: 4.0000 mg | Freq: Once | INTRAMUSCULAR | Status: DC | PRN

## 2018-02-22 MED ORDER — FENTANYL CITRATE (PF) 100 MCG/2ML IJ SOLN
INTRAMUSCULAR | Status: AC
  Filled 2018-02-22: qty 2

## 2018-02-22 MED ORDER — FENTANYL CITRATE (PF) 100 MCG/2ML IJ SOLN
INTRAMUSCULAR | Status: AC
  Administered 2018-02-22: 50 ug via INTRAVENOUS
  Filled 2018-02-22: qty 2

## 2018-02-22 MED ORDER — TRANEXAMIC ACID 1000 MG/10ML IV SOLN
INTRAVENOUS | Status: DC | PRN
  Administered 2018-02-22: 1000 mg via INTRAVENOUS

## 2018-02-22 MED ORDER — ESTRADIOL 0.1 MG/GM VA CREA
1.0000 | TOPICAL_CREAM | Freq: Every day | VAGINAL | Status: DC
  Filled 2018-02-22: qty 42.5

## 2018-02-22 MED ORDER — SODIUM CHLORIDE 0.9 % IV SOLN
INTRAVENOUS | Status: DC | PRN
  Administered 2018-02-22: 60 mL

## 2018-02-22 MED ORDER — DIPHENHYDRAMINE HCL 50 MG/ML IJ SOLN
INTRAMUSCULAR | Status: DC | PRN
  Administered 2018-02-22: 12.5 mg via INTRAVENOUS

## 2018-02-22 MED ORDER — FENTANYL CITRATE (PF) 100 MCG/2ML IJ SOLN: 25.0000 ug | INTRAMUSCULAR | Status: DC | PRN

## 2018-02-22 MED ORDER — ALBUTEROL SULFATE HFA 108 (90 BASE) MCG/ACT IN AERS: 2.0000 | INHALATION_SPRAY | Freq: Four times a day (QID) | RESPIRATORY_TRACT | Status: DC | PRN

## 2018-02-22 MED ORDER — LIDOCAINE HCL (PF) 1 % IJ SOLN
INTRAMUSCULAR | Status: DC | PRN
  Administered 2018-02-22: .8 mL via SUBCUTANEOUS

## 2018-02-22 MED ORDER — VITAMIN B-6 50 MG PO TABS
100.0000 mg | ORAL_TABLET | Freq: Every day | ORAL | Status: DC
  Administered 2018-02-24: 100 mg via ORAL
  Filled 2018-02-22 (×3): qty 2

## 2018-02-22 MED ORDER — MAGNESIUM 500 MG PO TABS: 100.0000 mg | ORAL_TABLET | Freq: Every day | ORAL | Status: DC

## 2018-02-22 MED ORDER — PROMETHAZINE HCL 25 MG/ML IJ SOLN: 12.5000 mg | Freq: Four times a day (QID) | INTRAMUSCULAR | Status: DC | PRN

## 2018-02-22 MED ORDER — ALBUTEROL SULFATE (2.5 MG/3ML) 0.083% IN NEBU
2.5000 mg | INHALATION_SOLUTION | Freq: Four times a day (QID) | RESPIRATORY_TRACT | Status: DC | PRN
  Administered 2018-02-22: 2.5 mg via RESPIRATORY_TRACT
  Filled 2018-02-22: qty 3

## 2018-02-22 MED ORDER — CITALOPRAM HYDROBROMIDE 10 MG PO TABS
10.0000 mg | ORAL_TABLET | Freq: Every day | ORAL | Status: DC
  Administered 2018-02-22 – 2018-02-23 (×2): 10 mg via ORAL
  Filled 2018-02-22 (×3): qty 1

## 2018-02-22 MED ORDER — TRANEXAMIC ACID 1000 MG/10ML IV SOLN
INTRAVENOUS | Status: AC
  Filled 2018-02-22: qty 10

## 2018-02-22 MED ORDER — SALINE SPRAY 0.65 % NA SOLN
1.0000 | Freq: Two times a day (BID) | NASAL | Status: DC
Start: 2018-02-22 — End: 2018-02-24
  Administered 2018-02-22 – 2018-02-24 (×2): 1 via NASAL
  Filled 2018-02-22: qty 44

## 2018-02-22 MED ORDER — SODIUM CHLORIDE 0.9 % IJ SOLN
INTRAMUSCULAR | Status: AC
  Filled 2018-02-22: qty 50

## 2018-02-22 MED ORDER — BISACODYL 10 MG RE SUPP: 10.0000 mg | Freq: Every day | RECTAL | Status: DC | PRN

## 2018-02-22 MED ORDER — PHENYLEPHRINE-GUAIFENESIN 10-400 MG PO TABS: 1.0000 | ORAL_TABLET | ORAL | Status: DC | PRN

## 2018-02-22 MED ORDER — LIDOCAINE HCL (CARDIAC) PF 100 MG/5ML IV SOSY
PREFILLED_SYRINGE | INTRAVENOUS | Status: DC | PRN
  Administered 2018-02-22: 60 mg via INTRAVENOUS

## 2018-02-22 MED ORDER — FLEET ENEMA 7-19 GM/118ML RE ENEM: 1.0000 | ENEMA | Freq: Once | RECTAL | Status: DC | PRN

## 2018-02-22 MED ORDER — MAGNESIUM OXIDE 400 (241.3 MG) MG PO TABS
200.0000 mg | ORAL_TABLET | Freq: Every day | ORAL | Status: DC
  Administered 2018-02-23: 200 mg via ORAL
  Filled 2018-02-22: qty 1

## 2018-02-22 MED ORDER — DIPHENHYDRAMINE HCL 25 MG PO CAPS: 25.0000 mg | ORAL_CAPSULE | Freq: Every day | ORAL | Status: DC | PRN

## 2018-02-22 MED ORDER — MIDAZOLAM HCL 2 MG/2ML IJ SOLN
1.0000 mg | Freq: Once | INTRAMUSCULAR | Status: AC
  Administered 2018-02-22: 1 mg via INTRAVENOUS

## 2018-02-22 MED ORDER — MORPHINE SULFATE (PF) 4 MG/ML IV SOLN: 0.5000 mg | INTRAVENOUS | Status: DC | PRN

## 2018-02-22 MED ORDER — PHENYLEPHRINE HCL 10 MG/ML IJ SOLN
INTRAMUSCULAR | Status: DC | PRN
  Administered 2018-02-22 (×2): 100 ug via INTRAVENOUS
  Administered 2018-02-22 (×2): 50 ug via INTRAVENOUS
  Administered 2018-02-22 (×2): 100 ug via INTRAVENOUS

## 2018-02-22 MED ORDER — METOCLOPRAMIDE HCL 10 MG PO TABS
5.0000 mg | ORAL_TABLET | Freq: Three times a day (TID) | ORAL | Status: DC | PRN
  Administered 2018-02-22: 10 mg via ORAL
  Filled 2018-02-22: qty 1

## 2018-02-22 SURGICAL SUPPLY — 63 items
BIT DRILL 2.5 (BIT) ×1
BIT DRILL 2.5X4.5XSCR (BIT) ×1 IMPLANT
BIT DRL 2.5X4.5XSCR (BIT) ×1
BLADE SAGITTAL WIDE XTHICK NO (BLADE) ×2 IMPLANT
CANISTER SUCT 1200ML W/VALVE (MISCELLANEOUS) ×2 IMPLANT
CANISTER SUCT 3000ML PPV (MISCELLANEOUS) ×4 IMPLANT
CHLORAPREP W/TINT 26ML (MISCELLANEOUS) ×2 IMPLANT
COOLER POLAR GLACIER W/PUMP (MISCELLANEOUS) ×2 IMPLANT
CRADLE LAMINECT ARM (MISCELLANEOUS) ×2 IMPLANT
DRAPE IMP U-DRAPE 54X76 (DRAPES) ×4 IMPLANT
DRAPE INCISE IOBAN 66X45 STRL (DRAPES) ×4 IMPLANT
DRAPE INCISE IOBAN 66X60 STRL (DRAPES) ×2 IMPLANT
DRAPE SHEET LG 3/4 BI-LAMINATE (DRAPES) ×4 IMPLANT
DRAPE TABLE BACK 80X90 (DRAPES) ×2 IMPLANT
DRSG OPSITE POSTOP 4X8 (GAUZE/BANDAGES/DRESSINGS) ×2 IMPLANT
ELECT BLADE 6.5 EXT (BLADE) ×2 IMPLANT
ELECT CAUTERY BLADE 6.4 (BLADE) ×2 IMPLANT
GLENOSPHERE RSS 2 CONCENTRIC (Shoulder) ×2 IMPLANT
GLOVE BIO SURGEON STRL SZ7.5 (GLOVE) ×8 IMPLANT
GLOVE BIO SURGEON STRL SZ8 (GLOVE) ×8 IMPLANT
GLOVE BIOGEL PI IND STRL 8 (GLOVE) ×1 IMPLANT
GLOVE BIOGEL PI INDICATOR 8 (GLOVE) ×1
GLOVE INDICATOR 8.0 STRL GRN (GLOVE) ×2 IMPLANT
GOWN STRL REUS W/ TWL LRG LVL3 (GOWN DISPOSABLE) ×1 IMPLANT
GOWN STRL REUS W/ TWL XL LVL3 (GOWN DISPOSABLE) ×1 IMPLANT
GOWN STRL REUS W/TWL LRG LVL3 (GOWN DISPOSABLE) ×1
GOWN STRL REUS W/TWL XL LVL3 (GOWN DISPOSABLE) ×1
GUIDE PIN 2.0 S150MM (PIN) ×2 IMPLANT
HOOD PEEL AWAY FLYTE STAYCOOL (MISCELLANEOUS) ×6 IMPLANT
KIT STABILIZATION SHOULDER (MISCELLANEOUS) ×2 IMPLANT
KIT TURNOVER KIT A (KITS) ×2 IMPLANT
LINER SHOULDER STD 3 (Liner) ×2 IMPLANT
MASK FACE SPIDER DISP (MASK) ×2 IMPLANT
NDL SAFETY ECLIPSE 18X1.5 (NEEDLE) ×1 IMPLANT
NEEDLE HYPO 18GX1.5 SHARP (NEEDLE) ×1
NEEDLE HYPO 22GX1.5 SAFETY (NEEDLE) ×2 IMPLANT
NEEDLE SPNL 20GX3.5 QUINCKE YW (NEEDLE) ×2 IMPLANT
NS IRRIG 500ML POUR BTL (IV SOLUTION) ×2 IMPLANT
PACK ARTHROSCOPY SHOULDER (MISCELLANEOUS) ×2 IMPLANT
PAD WRAPON POLAR SHDR UNIV (MISCELLANEOUS) ×1 IMPLANT
PLATE BASE REVERSE RSS S (Plate) ×2 IMPLANT
PULSAVAC PLUS IRRIG FAN TIP (DISPOSABLE) ×2
SCREW 4.5X15 RSS W CAP (Screw) ×4 IMPLANT
SCREW 4.5X20 RSS W CAP (Screw) ×2 IMPLANT
SCREW 4.5X25 RSS W CAP (Screw) ×4 IMPLANT
SCREW BODY REVERSE SMALL TITAN (Screw) ×2 IMPLANT
SLING ULTRA II M (MISCELLANEOUS) ×2 IMPLANT
SOL .9 NS 3000ML IRR  AL (IV SOLUTION) ×1
SOL .9 NS 3000ML IRR UROMATIC (IV SOLUTION) ×1 IMPLANT
SPONGE LAP 18X18 RF (DISPOSABLE) IMPLANT
STAPLER SKIN PROX 35W (STAPLE) ×2 IMPLANT
STEM PRESS FIT SZ 10 TSS (Stem) ×2 IMPLANT
SUT ETHIBOND 0 MO6 C/R (SUTURE) ×2 IMPLANT
SUT FIBERWIRE #2 38 BLUE 1/2 (SUTURE) ×8
SUT VIC AB 0 CT1 36 (SUTURE) ×4 IMPLANT
SUT VIC AB 2-0 CT1 27 (SUTURE) ×2
SUT VIC AB 2-0 CT1 TAPERPNT 27 (SUTURE) ×2 IMPLANT
SUTURE FIBERWR #2 38 BLUE 1/2 (SUTURE) ×4 IMPLANT
SYR 10ML LL (SYRINGE) ×2 IMPLANT
SYR 30ML LL (SYRINGE) ×4 IMPLANT
TIP FAN IRRIG PULSAVAC PLUS (DISPOSABLE) ×1 IMPLANT
TRAY FOLEY MTR SLVR 16FR STAT (SET/KITS/TRAYS/PACK) ×2 IMPLANT
WRAPON POLAR PAD SHDR UNIV (MISCELLANEOUS) ×2

## 2018-02-22 NOTE — NC FL2 (Signed)
[place] LEVEL OF CARE SCREENING TOOL     IDENTIFICATION  Patient Name: Vanessa Evans Birthdate: 11-12-1945 Sex: female Admission Date (Current Location): 02/22/2018  [place] Number:  Engineering geologist and Address:  [facility], [address] [place], [place] [address]      Provider Number: [id number]  Attending Physician Name and Address:  Corky Mull, MD  Relative Name and Phone Number:       Current Level of Care: Hospital Recommended Level of Care: [place] Prior Approval Number:    Date Approved/Denied:   PASRR Number: [id number])  Discharge Plan: SNF    Current Diagnoses: Patient Active Problem List   Diagnosis Date Noted  . Status post reverse total shoulder replacement, right 02/22/2018  . Pain in joint of right shoulder 02/13/2018  . Abdominal aortic atherosclerosis ([place]) 02/13/2018  . Episode of recurrent major depressive disorder ([place]) 02/13/2018  . Screening for breast cancer 02/13/2018  . Nausea 11/08/2017  . Acute gastritis 11/08/2017  . Allergic rhinitis 09/20/2017  . Eczema 09/20/2017  . Hematuria 09/20/2017  . Hyperlipidemia, mixed 09/20/2017  . Osteoarthritis 09/20/2017  . Osteoporosis, post-menopausal 09/20/2017  . Palpitations 07/05/2017  . Epigastric pain 05/24/2017  . Gastroesophageal reflux disease without esophagitis 05/24/2017  . Impingement syndrome of left shoulder 10/02/2016  . Trochanteric bursitis of left hip 10/02/2016  . SOB (shortness of breath) 04/16/2016  . Uterine prolapse 04/09/2016  . Cystocele 04/09/2016  . Hyponatremia 04/09/2016  . Benign essential HTN 11/18/2015  . Paroxysmal supraventricular tachycardia ([place]) 11/18/2015  . Premature ventricular contraction 07/20/2014  . History of colonic polyps 05/29/2014  . Asthma 02/05/2014  . Chest pain 02/05/2014  . Increased frequency of urination 02/05/2014  . Tachycardia 02/05/2014  . Female  stress incontinence 12/14/2013  . Gross hematuria 12/14/2013  . Incomplete emptying of bladder 12/14/2013  . Other chronic cystitis without hematuria 12/14/2013    Orientation RESPIRATION BLADDER Height & Weight     Self, Time, Situation, Place  Normal Incontinent Weight: 134 lb 1 oz (60.8 kg) Height:  5\' 1"  (154.9 cm)  BEHAVIORAL SYMPTOMS/MOOD NEUROLOGICAL BOWEL NUTRITION STATUS      Continent Diet(Diet: Clear Liquid to be Advanced. )  AMBULATORY STATUS COMMUNICATION OF NEEDS Skin   Extensive Assist Verbally Surgical wounds(Incision: Right Shoulder )                       Personal Care Assistance Level of Assistance  Bathing, Feeding, Dressing Bathing Assistance: Limited assistance Feeding assistance: Independent Dressing Assistance: Limited assistance     Functional Limitations Info  Sight, Hearing, Speech Sight Info: Adequate Hearing Info: Adequate Speech Info: Adequate    SPECIAL CARE FACTORS FREQUENCY  PT (By licensed PT), OT (By licensed OT)     PT Frequency: (5) OT Frequency: (5)            Contractures      Additional Factors Info  Code Status, Allergies Code Status Info: (Full Code. ) Allergies Info: (Codeine, Levofloxacin, Ultram Tramadol, Nsaids, Oxycontin Oxycodone, Sulfacetamide, Adhesive Tape, Azithromycin, Ciprofloxacin, Ketorolac, Nitrofurantoin, Sulfa Antibiotics, Tolmetin, Zofran Ondansetron Hcl)           Current Medications (02/22/2018):  This is the current hospital active medication list Current Facility-Administered Medications  Medication Dose Route Frequency Provider Last Rate Last Dose  . 0.9 %  sodium chloride infusion   Intravenous Continuous Poggi, Marshall Cork, MD      . [  START ON 02/23/2018] acetaminophen (TYLENOL) tablet 325-650 mg  325-650 mg Oral Q6H PRN Poggi, Marshall Cork, MD      . albuterol (PROVENTIL HFA;VENTOLIN HFA) 108 (90 Base) MCG/ACT inhaler 2 puff  2 puff Inhalation Q6H PRN Poggi, Marshall Cork, MD      . albuterol (PROVENTIL)  (2.5 MG/3ML) 0.083% nebulizer solution 2.5 mg  2.5 mg Nebulization Q6H PRN Poggi, Marshall Cork, MD      . aspirin EC tablet 81 mg  81 mg Oral QHS Poggi, Marshall Cork, MD      . bisacodyl (DULCOLAX) suppository 10 mg  10 mg Rectal Daily PRN Poggi, Marshall Cork, MD      . Calcium Carbonate-Vitamin D 600-400 MG-UNIT 1 tablet  1 tablet Oral BID Poggi, Marshall Cork, MD      . ceFAZolin (ANCEF) IVPB 2g/100 mL premix  2 g Intravenous Q6H Poggi, Marshall Cork, MD      . cholecalciferol (VITAMIN D) tablet 1,000 Units  1,000 Units Oral QHS Poggi, Marshall Cork, MD      . citalopram (CELEXA) tablet 10 mg  10 mg Oral QHS Poggi, Marshall Cork, MD      . cyclobenzaprine (FLEXERIL) tablet 5 mg  5 mg Oral BID PRN Poggi, Marshall Cork, MD      . diltiazem (CARDIZEM CD) 24 hr capsule 120 mg  120 mg Oral QHS Poggi, Marshall Cork, MD      . diphenhydrAMINE (BENADRYL) 12.5 MG/5ML elixir 12.5-25 mg  12.5-25 mg Oral Q4H PRN Poggi, Marshall Cork, MD      . diphenhydrAMINE (BENADRYL) tablet 25-50 mg  25-50 mg Oral Daily PRN Poggi, Marshall Cork, MD      . docusate sodium (COLACE) capsule 100 mg  100 mg Oral BID Poggi, Marshall Cork, MD      . Derrill Memo ON 02/23/2018] enoxaparin (LOVENOX) injection 40 mg  40 mg Subcutaneous Q24H Poggi, Marshall Cork, MD      . EPINEPHrine (ADRENALIN) 1 MG/ML           . estradiol (ESTRACE) vaginal cream 1 Applicatorful  1 Applicatorful Vaginal QHS Poggi, Marshall Cork, MD      . fenofibrate tablet 160 mg  160 mg Oral QPM Poggi, Marshall Cork, MD      . folic acid (FOLVITE) tablet 400 mcg  400 mcg Oral Daily Poggi, Marshall Cork, MD      . guaiFENesin (MUCINEX) 12 hr tablet 600 mg  600 mg Oral QHS PRN Poggi, Marshall Cork, MD      . HYDROcodone-acetaminophen (NORCO) 7.5-325 MG per tablet 1-2 tablet  1-2 tablet Oral Q4H PRN Poggi, Marshall Cork, MD      . ketotifen (ZADITOR) 0.025 % ophthalmic solution 1 drop  1 drop Both Eyes BID PRN Poggi, Marshall Cork, MD      . levocetirizine (XYZAL) tablet 5 mg  5 mg Oral Daily PRN Poggi, Marshall Cork, MD      . Derrill Memo ON 02/23/2018] levothyroxine (SYNTHROID, LEVOTHROID) tablet 50 mcg   50 mcg Oral QAC breakfast Poggi, Marshall Cork, MD      . magnesium hydroxide (MILK OF MAGNESIA) suspension 30 mL  30 mL Oral Daily PRN Poggi, Marshall Cork, MD      . Magnesium TABS 100 mg  100 mg Oral QHS Poggi, Marshall Cork, MD      . Melatonin TABS 5 mg  1 tablet Oral QHS Poggi, Marshall Cork, MD      . metoCLOPramide (REGLAN) tablet 5-10 mg  5-10 mg Oral  Q8H PRN Poggi, Marshall Cork, MD       Or  . metoCLOPramide (REGLAN) injection 5-10 mg  5-10 mg Intravenous Q8H PRN Poggi, Marshall Cork, MD      . morphine 4 MG/ML injection 0.52-1 mg  0.52-1 mg Intravenous Q2H PRN Poggi, Marshall Cork, MD      . multivitamin with minerals tablet 1 tablet  1 tablet Oral Daily Poggi, Marshall Cork, MD      . pantoprazole (PROTONIX) EC tablet 40 mg  40 mg Oral BID Poggi, Marshall Cork, MD      . Phenylephrine-guaiFENesin 10-400 MG TABS 1 tablet  1 tablet Oral Q4H PRN Poggi, Marshall Cork, MD      . polyvinyl alcohol (LIQUIFILM TEARS) 1.4 % ophthalmic solution 1 drop  1 drop Both Eyes Daily PRN Poggi, Marshall Cork, MD      . promethazine (PHENERGAN) injection 12.5 mg  12.5 mg Intravenous Q6H PRN Poggi, Marshall Cork, MD      . pyridOXINE (VITAMIN B-6) tablet 100 mg  100 mg Oral Daily Poggi, Marshall Cork, MD      . sodium chloride (OCEAN) 0.65 % nasal spray 1 spray  1 spray Each Nare BID Poggi, Marshall Cork, MD      . sodium phosphate (FLEET) 7-19 GM/118ML enema 1 enema  1 enema Rectal Once PRN Poggi, Marshall Cork, MD      . sucralfate (CARAFATE) tablet 1 g  1 g Oral TID BM Poggi, Marshall Cork, MD      . vitamin B-12 (CYANOCOBALAMIN) tablet 500 mcg  500 mcg Oral Daily Poggi, Marshall Cork, MD      . zinc gluconate tablet 50 mg  50 mg Oral Daily Poggi, Marshall Cork, MD         Discharge Medications: Please see discharge summary for a list of discharge medications.  Relevant Imaging Results:  Relevant Lab Results:   Additional Information (SSN: [id number])  Quay Simkin, Veronia Beets, LCSW

## 2018-02-22 NOTE — Anesthesia Preprocedure Evaluation (Addendum)
Anesthesia Evaluation  Patient identified by MRN, date of birth, ID band Patient awake    Reviewed: Allergy & Precautions, NPO status , Patient's Chart, lab work & pertinent test results  History of Anesthesia Complications (+) PONV and history of anesthetic complications  Airway Mallampati: III       Dental   Pulmonary asthma , former smoker,           Cardiovascular hypertension, Pt. on medications + Peripheral Vascular Disease and + Orthopnea  + dysrhythmias (tachycardia) + Valvular Problems/Murmurs (no tx)      Neuro/Psych neg Seizures Anxiety Depression    GI/Hepatic Neg liver ROS, GERD  Medicated,  Endo/Other  neg diabetesHypothyroidism   Renal/GU negative Renal ROS     Musculoskeletal   Abdominal   Peds  Hematology   Anesthesia Other Findings   Reproductive/Obstetrics                            Anesthesia Physical Anesthesia Plan  ASA: III  Anesthesia Plan: General   Post-op Pain Management: GA combined w/ Regional for post-op pain   Induction: Intravenous  PONV Risk Score and Plan: 4 or greater and Dexamethasone, Ondansetron, Midazolam and Treatment may vary due to age or medical condition  Airway Management Planned: Oral ETT  Additional Equipment:   Intra-op Plan:   Post-operative Plan:   Informed Consent: I have reviewed the patients History and Physical, chart, labs and discussed the procedure including the risks, benefits and alternatives for the proposed anesthesia with the patient or authorized representative who has indicated his/her understanding and acceptance.     Plan Discussed with:   Anesthesia Plan Comments:         Anesthesia Quick Evaluation

## 2018-02-22 NOTE — Anesthesia Post-op Follow-up Note (Signed)
Anesthesia QCDR form completed.        

## 2018-02-22 NOTE — Op Note (Signed)
02/22/2018  12:43 PM  Patient:   Vanessa Evans  Pre-Op Diagnosis:   Large irreparable rotator cuff tear with cuff arthropathy and large periarticular lipoma, right shoulder.  Post-Op Diagnosis:   Same.  Procedure:   1. Reverse right total shoulder arthroplasty.  2.  Excision of large periarticular lipoma, right shoulder.  Surgeon:   Pascal Lux, MD  Assistant:   Cameron Proud, PA-C  Anesthesia:   General endotracheal with an interscalene block placed preoperatively by the anesthesiologist.  Findings:   As above.  Complications:   None  EBL:   175 cc  Fluids:   1100 cc crystalloid  UOP:   None  TT:   None  Drains:   None  Closure:   Staples  Implants:   All press-fit Integra system with a 10 mm stem, a small metaphyseal body, a +3 mm humeral platform, a mini baseplate, and a 38 mm concentric +2 mm laterally offset glenosphere.  Brief Clinical Note:   The patient is a 72 year old female with a long history of gradually worsening right shoulder pain. Her symptoms have progressed despite medications, activity modification, etc. Her history and examination are consistent with a large rotator cuff tear with early degenerative joint disease. An MRI scan confirmed the presence of a large rotator cuff tear and early degenerative joint disease. It also demonstrated the presence of a large (5 cm) lipoma in the axilla just inferior to the glenohumeral joint. The patient presents at this time for a reverse right total shoulder arthroplasty and excision of the periarticular lipoma.  Procedure:   The patient underwent placement of an interscalene block by the anesthesiologist in the preoperative holding area before being brought into the operating room and lain in the supine position. The patient then underwent general endotracheal intubation and anesthesia before the patient was repositioned in the beach chair position using the beach chair positioner. The right shoulder and upper  extremity were prepped with ChloraPrep solution before being draped sterilely. Preoperative antibiotics were administered. A standard anterior approach to the shoulder was made through an approximately 4-5 inch incision. The incision was carried down through the subcutaneous tissues to expose the deltopectoral fascia. The interval between the deltoid and pectoralis muscles was identified and this plane developed, retracting the cephalic vein laterally with the deltoid muscle. The conjoined tendon was identified. Its lateral margin was dissected and the Kolbel self-retraining retractor inserted. The "three sisters" were identified and cauterized. Bursal tissues were removed to improve visualization. The subscapularis tendon was released from its attachment to the lesser tuberosity 1 cm proximal to its insertion and several tagging sutures placed. The inferior capsule was released with care after identifying and protecting the axillary nerve. The proximal humeral cut was made at approximately 20 of retroversion using the extra-medullary guide.    Prior to making the humeral cut, the large lipoma was identified and removed using digital dissection circumferentially.  Adherent capsular tissues near the neck of the humerus were released carefully using Metzenbaum scissors under close visualization.  The lipoma was removed intact in its entirety before being sent to pathology for definitive identification.  Attention was redirected to the glenoid. The labrum was debrided circumferentially before the center of the glenoid was identified. The guidewire was drilled into the glenoid neck using the appropriate guide. After verifying its position, it was overreamed with the mini-baseplate reamer to create a flat surface before the stem reamer was utilized. The superior and inferior peg sites were reamed using the  appropriate guide to complete the glenoid preparation. The permanent mini-baseplate was impacted into place. It  was stabilized with a 20 x 4.5 mm central screw and four peripheral screws. Locking caps were placed over the superior and inferior screws. The permanent 38 mm concentric glenosphere with +2 mm of lateral offset was then impacted into place and its Morse taper locking mechanism verified using manual distraction.  Attention was directed to the humeral side. The humeral canal was prepared utilizing the tapered stem reamers sequentially beginning with the 7 mm stem and progressing to a 10 mm stem. This demonstrated a good tight fit. The metaphyseal region was then prepared using the appropriate planar device. The trial body and small stem was put together on the back table and a trial reduction performed using the +0 mm and +3 mm inserts. With the +3 mm insert, the arm demonstrated excellent range of motion as the hand could be brought across the chest to the opposite shoulder and brought to the top of the patient's head and to the patient's ear. The shoulder appeared stable throughout this range of motion. The joint was dislocated and the trial components removed. The permanent 10 mm stem with the small body was impacted into place with care taken to maintain the appropriate version. After inserting the screw to connect the body with the stem, a repeat trial reduction with the +3 mm insert again demonstrated excellent stability with the findings as described above. Therefore, the shoulder was re-dislocated and the permanent +3 mm insert impacted into place. After verifying its locking mechanism, the shoulder was relocated using two finger pressure and again placed through a range of motion with the findings as described above.  The wound was copiously irrigated with bacitracin saline solution using the jet lavage system before a total of 20 cc of Exparel diluted out to 60 cc with normal saline and 30 cc of 0.5% Sensorcaine with epinephrine was injected into the pericapsular and peri-incisional tissues to help with  postoperative analgesia. The subscapularis tendon was reapproximated using #2 FiberWire interrupted sutures. The deltopectoral interval was closed using #0 Vicryl interrupted sutures before the subcutaneous tissues were closed using 2-0 Vicryl interrupted sutures. The skin was closed using staples. Prior to closing the skin, 1 g of transexemic acid in 10 cc of normal saline was injected intra-articularly to help with postoperative bleeding. A sterile occlusive dressing was applied to the wound before the arm was placed into a shoulder immobilizer with an abduction pillow. A Polar Care system also was applied to the shoulder. The patient was then transferred back to a hospital bed before being awakened, extubated, and returned to the recovery room in satisfactory condition after tolerating the procedure well.

## 2018-02-22 NOTE — Anesthesia Procedure Notes (Signed)
Anesthesia Regional Block: Interscalene brachial plexus block   Pre-Anesthetic Checklist: ,, timeout performed, Correct Patient, Correct Site, Correct Laterality, Correct Procedure, Correct Position, site marked, Risks and benefits discussed,  Surgical consent,  Pre-op evaluation,  At surgeon's request and post-op pain management  Laterality: Right  Prep: chloraprep       Needles:  Injection technique: Single-shot  Needle Type: Echogenic Stimulator Needle     Needle Length: 9cm  Needle Gauge: 22     Additional Needles:   Procedures:, nerve stimulator,,, ultrasound used (permanent image in chart),,,,   Nerve Stimulator or Paresthesia:  Response: biceps flexion, 0.8 mA,   Additional Responses:   Narrative:  Start time: 02/22/2018 10:01 AM End time: 02/22/2018 9:56 AM Injection made incrementally with aspirations every 5 mL.  Performed by: Personally   Additional Notes: Functioning IV was confirmed and monitors were applied.  A 9mm 22ga Stimuplex needle was used. Sterile prep and drape,hand hygiene and sterile gloves were used.  Negative aspiration and negative test dose prior to incremental administration of local anesthetic. The patient tolerated the procedure well.

## 2018-02-22 NOTE — Evaluation (Signed)
Physical Therapy Evaluation Patient Details Name: Vanessa Evans MRN: [id number] DOB: 26-May-1946 Today's Date: 02/22/2018   History of Present Illness  Patient is 72 yo female s/p R reverse total shoulder arthroplasty and excision of large periarticular lipoma 02/22/18. PMH of asthma, palpitations, HTN, PAD, paroxysmal supraventricular tachycardia,GERD, anxiety, depression, hypothyroidism  Clinical Impression  Patient A&Ox4 at start of session, no complaints of R shoulder pain. Per family and patient, patient was independent prior to admission with ADLs, IADLs, and ambulation. Patient able to perform bed mobility and transfers with supervision, including commode transfers. Patient able to maintain standing position ~16minutes with good posture and ability to shift weight with unilateral UE support (while speaking with physician). Patient slightly unsteady and reported feeling off balance without unilateral UE support, improved ambulation/safety with IV pole, ambulated ~137ft with CGA. Patient needed occasional cues to attend to RUE to ensure patient did not bump arm. The patient would benefit from further skilled PT to address these changes from PLOF.     Follow Up Recommendations Home health PT    Equipment Recommendations  None recommended by PT    Recommendations for Other Services       Precautions / Restrictions Precautions Precautions: Shoulder Shoulder Interventions: Shoulder abduction pillow;Shoulder sling/immobilizer Precaution Booklet Issued: No Required Braces or Orthoses: Sling Restrictions RUE Weight Bearing: Non weight bearing      Mobility  Bed Mobility Overal bed mobility: Needs Assistance Bed Mobility: Sit to Supine       Sit to supine: Supervision      Transfers Overall transfer level: Needs assistance   Transfers: Sit to/from Stand Sit to Stand: Supervision            Ambulation/Gait Ambulation/Gait assistance: Min guard Gait Distance (Feet):  180 Feet Assistive device: IV Pole       General Gait Details: Patient slightly unsteady during ambulation without IV pole, improved gait quality with unilateral UE support. Mild complaints of unsteadiness from patient  Stairs            Wheelchair Mobility    Modified Rankin (Stroke Patients Only)       Balance Overall balance assessment: Mild deficits observed, not formally tested                                           Pertinent Vitals/Pain Pain Assessment: No/denies pain    Home Living Family/patient expects to be discharged to:: Private residence Living Arrangements: Spouse/significant other Available Help at Discharge: Family Type of Home: House Home Access: Stairs to enter Entrance Stairs-Rails: Right Entrance Stairs-Number of Steps: 2  Home Layout: One level Home Equipment: Grab bars - toilet;Bedside commode;Grab bars - tub/shower;Walker - 4 wheels;Shower seat - built in;Cane - single point      Prior Function Level of Independence: Independent               Hand Dominance   Dominant Hand: Right    Extremity/Trunk Assessment   Upper Extremity Assessment Upper Extremity Assessment: Defer to OT evaluation;RUE deficits/detail;LUE deficits/detail RUE: Unable to fully assess due to immobilization LUE Deficits / Details: 3+/5    Lower Extremity Assessment Lower Extremity Assessment: Overall WFL for tasks assessed;RLE deficits/detail;LLE deficits/detail RLE Deficits / Details: 4/5 LLE Deficits / Details: 4/5       Communication   Communication: No difficulties  Cognition Arousal/Alertness: Awake/alert Behavior During Therapy: WFL for tasks  assessed/performed                                          General Comments      Exercises Other Exercises Other Exercises: Patient ambulated to bathroom, able to use commode/perform toileting needs with CGA/supervision. Stood ~31minutes with single UE support to  talk with physician. No LOB, able to shift weight, good posture.    Assessment/Plan    PT Assessment Patient needs continued PT services  PT Problem List Decreased strength;Decreased activity tolerance;Decreased balance;Decreased mobility;Decreased knowledge of precautions       PT Treatment Interventions DME instruction;Therapeutic exercise;Gait training;Balance training;Stair training;Neuromuscular re-education;Therapeutic activities;Patient/family education    PT Goals (Current goals can be found in the Care Plan section)  Acute Rehab PT Goals Patient Stated Goal: Patient ready to return home safely PT Goal Formulation: With patient Time For Goal Achievement: 03/08/18 Potential to Achieve Goals: Good    Frequency BID   Barriers to discharge        Co-evaluation               AM-PAC PT "6 Clicks" Daily Activity  Outcome Measure Difficulty turning over in bed (including adjusting bedclothes, sheets and blankets)?: None Difficulty moving from lying on back to sitting on the side of the bed? : A Little Difficulty sitting down on and standing up from a chair with arms (e.g., wheelchair, bedside commode, etc,.)?: A Little Help needed moving to and from a bed to chair (including a wheelchair)?: A Little Help needed walking in hospital room?: A Little Help needed climbing 3-5 steps with a railing? : A Little 6 Click Score: 19    End of Session Equipment Utilized During Treatment: Gait belt Activity Tolerance: Patient tolerated treatment well Patient left: in chair;with chair alarm set;with call bell/phone within reach;with SCD's reapplied(heels elevated, polar care applied and activated) Nurse Communication: Mobility status PT Visit Diagnosis: Unsteadiness on feet (R26.81);Muscle weakness (generalized) (M62.81)    Time: 5732-2025 PT Time Calculation (min) (ACUTE ONLY): 40 min   Charges:   PT Evaluation $PT Eval Low Complexity: 1 Low PT Treatments $Gait Training:  8-22 mins $Therapeutic Activity: 8-22 mins   PT G Codes:       Lieutenant Diego PT, DPT 4:37 PM,02/22/18 [phone]

## 2018-02-22 NOTE — Anesthesia Postprocedure Evaluation (Signed)
Anesthesia Post Note  Patient: Vanessa Evans  Procedure(s) Performed: REVERSE SHOULDER ARTHROPLASTY (Right Shoulder)  Patient location during evaluation: PACU Anesthesia Type: General Level of consciousness: awake and alert Pain management: pain level controlled Vital Signs Assessment: post-procedure vital signs reviewed and stable Respiratory status: spontaneous breathing and respiratory function stable Cardiovascular status: stable Anesthetic complications: no     Last Vitals:  Vitals:   02/22/18 1335 02/22/18 1356  BP: 123/70 134/65  Pulse: 90 80  Resp: 14 20  Temp: (!) 36.3 C 36.4 C  SpO2: 99% 97%    Last Pain:  Vitals:   02/22/18 1335  TempSrc:   PainSc: 0-No pain                 KEPHART,WILLIAM K

## 2018-02-22 NOTE — Anesthesia Procedure Notes (Addendum)
Procedure Name: Intubation Date/Time: 02/22/2018 10:31 AM Performed by: Wess Botts, RN Pre-anesthesia Checklist: Timeout performed, Patient being monitored, Suction available, Emergency Drugs available and Patient identified Patient Re-evaluated:Patient Re-evaluated prior to induction Oxygen Delivery Method: Circle system utilized Preoxygenation: Pre-oxygenation with 100% oxygen Induction Type: IV induction Ventilation: Mask ventilation without difficulty Laryngoscope Size: Mac and 3 Grade View: Grade II Tube type: Oral Tube size: 7.0 mm Number of attempts: 1 Airway Equipment and Method: Stylet Placement Confirmation: ETT inserted through vocal cords under direct vision,  positive ETCO2 and breath sounds checked- equal and bilateral Secured at: 21 cm Tube secured with: Tape Dental Injury: Teeth and Oropharynx as per pre-operative assessment

## 2018-02-22 NOTE — H&P (Signed)
Paper H&P to be scanned into permanent record. H&P reviewed and patient re-examined. No changes. 

## 2018-02-22 NOTE — Transfer of Care (Signed)
Immediate Anesthesia Transfer of Care Note  Patient: Vanessa Evans  Procedure(s) Performed: REVERSE SHOULDER ARTHROPLASTY (Right Shoulder)  Patient Location: PACU  Anesthesia Type:GA combined with regional for post-op pain  Level of Consciousness: sedated  Airway & Oxygen Therapy: Patient Spontanous Breathing and Patient connected to face mask oxygen  Post-op Assessment: Report given to RN and Post -op Vital signs reviewed and stable  Post vital signs: Reviewed  Last Vitals:  Vitals Value Taken Time  BP 130/63 02/22/2018 12:49 PM  Temp    Pulse 91 02/22/2018 12:49 PM  Resp 15 02/22/2018 12:49 PM  SpO2 99 % 02/22/2018 12:49 PM  Vitals shown include unvalidated device data.  Last Pain:  Vitals:   02/22/18 1005  TempSrc:   PainSc: 0-No pain         Complications: No apparent anesthesia complications

## 2018-02-23 ENCOUNTER — Other Ambulatory Visit: Payer: Self-pay

## 2018-02-23 ENCOUNTER — Encounter: Payer: Self-pay | Admitting: Surgery

## 2018-02-23 LAB — CBC WITH DIFFERENTIAL/PLATELET
BASOS ABS: 0 10*3/uL (ref 0–0.1)
BASOS PCT: 0 %
EOS ABS: 0 10*3/uL (ref 0–0.7)
Eosinophils Relative: 0 %
HCT: 34.6 % — ABNORMAL LOW (ref 35.0–47.0)
Hemoglobin: 11.6 g/dL — ABNORMAL LOW (ref 12.0–16.0)
Lymphocytes Relative: 6 %
Lymphs Abs: 0.6 10*3/uL — ABNORMAL LOW (ref 1.0–3.6)
MCH: 28.3 pg (ref 26.0–34.0)
MCHC: 33.5 g/dL (ref 32.0–36.0)
MCV: 84.5 fL (ref 80.0–100.0)
MONO ABS: 1 10*3/uL — AB (ref 0.2–0.9)
MONOS PCT: 10 %
Neutro Abs: 8.9 10*3/uL — ABNORMAL HIGH (ref 1.4–6.5)
Neutrophils Relative %: 84 %
Platelets: 249 10*3/uL (ref 150–440)
RBC: 4.09 MIL/uL (ref 3.80–5.20)
RDW: 15 % — AB (ref 11.5–14.5)
WBC: 10.5 10*3/uL (ref 3.6–11.0)

## 2018-02-23 LAB — BASIC METABOLIC PANEL
ANION GAP: 8 (ref 5–15)
BUN: 7 mg/dL — AB (ref 8–23)
CHLORIDE: 107 mmol/L (ref 98–111)
CO2: 25 mmol/L (ref 22–32)
Calcium: 8.6 mg/dL — ABNORMAL LOW (ref 8.9–10.3)
Creatinine, Ser: 0.53 mg/dL (ref 0.44–1.00)
GFR calc non Af Amer: >60 mL/min (ref >60)
Glucose, Bld: 117 mg/dL — ABNORMAL HIGH (ref 70–99)
Potassium: 2.8 mmol/L — ABNORMAL LOW (ref 3.5–5.1)
SODIUM: 140 mmol/L (ref 135–145)

## 2018-02-23 LAB — POTASSIUM: Potassium: 3.7 mmol/L (ref 3.5–5.1)

## 2018-02-23 MED ORDER — HYDROCODONE-ACETAMINOPHEN 7.5-325 MG PO TABS: 1.0000 | ORAL_TABLET | ORAL | 0 refills | Status: DC | PRN

## 2018-02-23 MED ORDER — POTASSIUM CHLORIDE 20 MEQ PO PACK
20.0000 meq | PACK | Freq: Three times a day (TID) | ORAL | Status: AC
  Administered 2018-02-23 (×3): 20 meq via ORAL
  Filled 2018-02-23 (×3): qty 1

## 2018-02-23 MED ORDER — METHOCARBAMOL 500 MG PO TABS
500.0000 mg | ORAL_TABLET | Freq: Four times a day (QID) | ORAL | Status: DC | PRN
  Administered 2018-02-24: 500 mg via ORAL
  Filled 2018-02-23 (×2): qty 1

## 2018-02-23 NOTE — Discharge Summary (Addendum)
Physician Discharge Summary  Patient ID: Vanessa Evans MRN: [id number] DOB/AGE: 04/15/1946 72 y.o.  Admit date: 02/22/2018 Discharge date: 02/24/2018  Admission Diagnoses:  Large irreparable rotator cuff tear with cuff arthropathy and large periarticular lipoma, right shoulder.  Discharge Diagnoses: Patient Active Problem List   Diagnosis Date Noted  . Status post reverse total shoulder replacement, right 02/22/2018  . Pain in joint of right shoulder 02/13/2018  . Abdominal aortic atherosclerosis ([place]) 02/13/2018  . Episode of recurrent major depressive disorder ([place]) 02/13/2018  . Screening for breast cancer 02/13/2018  . Nausea 11/08/2017  . Acute gastritis 11/08/2017  . Allergic rhinitis 09/20/2017  . Eczema 09/20/2017  . Hematuria 09/20/2017  . Hyperlipidemia, mixed 09/20/2017  . Osteoarthritis 09/20/2017  . Osteoporosis, post-menopausal 09/20/2017  . Palpitations 07/05/2017  . Epigastric pain 05/24/2017  . Gastroesophageal reflux disease without esophagitis 05/24/2017  . Impingement syndrome of left shoulder 10/02/2016  . Trochanteric bursitis of left hip 10/02/2016  . SOB (shortness of breath) 04/16/2016  . Uterine prolapse 04/09/2016  . Cystocele 04/09/2016  . Hyponatremia 04/09/2016  . Benign essential HTN 11/18/2015  . Paroxysmal supraventricular tachycardia ([place]) 11/18/2015  . Premature ventricular contraction 07/20/2014  . History of colonic polyps 05/29/2014  . Asthma 02/05/2014  . Chest pain 02/05/2014  . Increased frequency of urination 02/05/2014  . Tachycardia 02/05/2014  . Female stress incontinence 12/14/2013  . Gross hematuria 12/14/2013  . Incomplete emptying of bladder 12/14/2013  . Other chronic cystitis without hematuria 12/14/2013    Past Medical History:  Diagnosis Date  . Anxiety   . Arthritis    osteo  . Asthma    needs rescue inhaler few times a year  . Depression   . Dizziness    light headed spells but it has been a while  .  Dysrhythmia    tachycardia.(cardizem). brady during procedures  . GERD (gastroesophageal reflux disease)    gastritis  . Headache(784.0)   . Heart murmur 2019   aortic. dr. Nehemiah Massed not worried about this  . Hyperlipidemia   . Hypertension   . Hypothyroidism   . Migraine   . Orthopnea   . Pneumonia    long time ago (greater than 5 years ago)  . PONV (postoperative nausea and vomiting)    very anxious during cataract surgery. brady during colonoscopy  . Shortness of breath    any exertion, cannot lie flat     Transfusion: None.   Consultants (if any):   Discharged Condition: Improved  Hospital Course: Vanessa Evans is an 72 y.o. female who was admitted 02/22/2018 with a diagnosis of a large irreparable rotator cuff tear with cuff arthropathy and large periarticular lipoma of the right shoulder and went to the operating room on 02/22/2018 and underwent the above named procedures.   K+ this AM was 2.8, treated with Klor-con and repeat potassium was 3.7.   Surgeries: Procedure(s): REVERSE SHOULDER ARTHROPLASTY and excision of large periarticular lipoma on 02/22/2018 Patient tolerated the surgery well. Taken to PACU where she was stabilized and then transferred to the orthopedic floor.  Started on Lovenox 40mg  q 24 hrs. Foot pumps applied bilaterally at 80 mm. Heels elevated on bed with rolled towels. No evidence of DVT. Negative [place]. Physical therapy started on day #1 for gait training and transfer. OT started day #1 for ADL and assisted devices.  Patient's IV was removed on POD1.  Implants: All press-fit Integra system with a 10 mm stem, a small metaphyseal body, a +3 mm  humeral platform, a mini baseplate, and a 38 mm concentric +2 mm laterally offset glenosphere.  She was given perioperative antibiotics:  Anti-infectives (From admission, onward)   Start     Dose/Rate Route Frequency Ordered Stop   02/22/18 1800  ceFAZolin (ANCEF) IVPB 2g/100 mL premix     2 g 200 mL/hr  over 30 Minutes Intravenous Every 6 hours 02/22/18 1410 02/23/18 1203   02/22/18 0857  clindamycin (CLEOCIN) 900 MG/50ML IVPB    Note to Pharmacy:  Vanessa Evans   : cabinet override      02/22/18 0857 02/22/18 1042   02/21/18 2215  clindamycin (CLEOCIN) IVPB 900 mg     900 mg 100 mL/hr over 30 Minutes Intravenous  Once 02/21/18 2214 02/22/18 1057    .  She was given sequential compression devices, early ambulation, and Lovenox for DVT prophylaxis.  She benefited maximally from the hospital stay and there were no complications.    Recent vital signs:  Vitals:   02/24/18 0905 02/24/18 1028  BP:  (!) 125/52  Pulse: 79 75  Resp:    Temp:    SpO2: 97% 98%    Recent laboratory studies:  Lab Results  Component Value Date   HGB 11.6 (L) 02/23/2018   HGB 13.0 02/11/2018   HGB 13.4 06/07/2017   Lab Results  Component Value Date   WBC 10.5 02/23/2018   PLT 249 02/23/2018   Lab Results  Component Value Date   INR 0.92 02/11/2018   Lab Results  Component Value Date   NA 136 02/24/2018   K 4.0 02/24/2018   CL 105 02/24/2018   CO2 26 02/24/2018   BUN 8 02/24/2018   CREATININE 0.62 02/24/2018   GLUCOSE 137 (H) 02/24/2018    Discharge Medications:   Allergies as of 02/24/2018      Reactions   Codeine Shortness Of Breath, Itching   Bronchospasms and asthma attach   Levofloxacin    Altered mental status": drunk" feeling, confused, speech problems Other reaction(s): Unknown   Ultram [tramadol] Itching, Other (See Comments)   Bronchospasm and asthma attack   Nsaids Other (See Comments)   Bloody stools, abdominal pain History of gastritis   Oxycontin [oxycodone] Itching   Would take benadryl to eliminate itching. Has had bronchospasm   Sulfacetamide Rash   Fever, abdominal pain   Adhesive [tape] Other (See Comments)   Thin skin. Causes tears. Paper tape okay   Azithromycin    Unsure. Patient does not remember but thinks that it did not work for her.   Ciprofloxacin  Other (See Comments)   Severe pain in neck and down back   Ketorolac Itching   Nitrofurantoin Diarrhea   Tried again and had no problems with it   Sulfa Antibiotics Itching, Rash, Other (See Comments)   fever   Tolmetin    Other reaction(s): Other (See Comments) Bloody stools, abdominal pain History of chronic gastritis   Zofran [ondansetron Hcl] Other (See Comments)   constipation      Medication List    TAKE these medications   acetaminophen 650 MG CR tablet Commonly known as:  TYLENOL Take 1,300 mg by mouth at bedtime as needed for pain.   acetaminophen 500 MG tablet Commonly known as:  TYLENOL Take 1,000 mg by mouth 2 (two) times daily as needed for moderate pain.   albuterol (2.5 MG/3ML) 0.083% nebulizer solution Commonly known as:  PROVENTIL Take 3 mLs (2.5 mg total) by nebulization every 6 (six) hours as  needed for wheezing or shortness of breath.   VENTOLIN HFA 108 (90 Base) MCG/ACT inhaler Generic drug:  albuterol Take 2 puffs using inhaler four times a day as needed   ARTIFICIAL TEARS OP Place 1-2 drops into both eyes daily as needed (dry eyes).   aspirin EC 81 MG tablet Take 81 mg by mouth at bedtime.   CALCIUM 600+D 600-400 MG-UNIT tablet Generic drug:  Calcium Carbonate-Vitamin D Take 1 tablet by mouth 2 (two) times daily.   cholecalciferol 1000 units tablet Commonly known as:  VITAMIN D Take 1,000 Units by mouth at bedtime.   citalopram 10 MG tablet Commonly known as:  CELEXA Take 1 tablet (10 mg total) by mouth daily. 1/2 to 1 tab every evening for depression / mood. What changed:    when to take this  additional instructions   cyclobenzaprine 10 MG tablet Commonly known as:  FLEXERIL Take 0.5 tablets (5 mg total) by mouth 2 (two) times daily as needed for muscle spasms. What changed:    how much to take  additional instructions   diazepam 5 MG tablet Commonly known as:  VALIUM Take 1 tablet po 1.5 hours pror to procedure. May repeat  dose in 1 hour if needed.   diclofenac sodium 1 % Gel Commonly known as:  VOLTAREN Apply 1 application topically 4 (four) times daily as needed (pain).   diltiazem 120 MG 24 hr capsule Commonly known as:  CARDIZEM CD Take 120 mg by mouth at bedtime.   diphenhydrAMINE 25 MG tablet Commonly known as:  BENADRYL Take 25-50 mg by mouth daily as needed for itching.   docusate sodium 100 MG capsule Commonly known as:  COLACE Take 200 mg by mouth at bedtime as needed for moderate constipation.   EPIPEN 2-PAK 0.3 mg/0.3 mL Soaj injection Generic drug:  EPINEPHrine use as directed for severe allergy reaction   estradiol 0.1 MG/GM vaginal cream Commonly known as:  ESTRACE VAGINAL Apply a pea-sized amount to urethra at bedtime   fenofibrate 160 MG tablet take1 tab po daily for chol What changed:    how much to take  how to take this  when to take this  additional instructions   folic acid 778 MCG tablet Commonly known as:  FOLVITE Take 400 mcg by mouth daily.   guaiFENesin 600 MG 12 hr tablet Commonly known as:  MUCINEX Take 600 mg by mouth at bedtime as needed (congestion).   HYDROcodone-acetaminophen 7.5-325 MG tablet Commonly known as:  NORCO Take 1-2 tablets by mouth every 4 (four) hours as needed for severe pain (pain score 7-10).   ketotifen 0.025 % ophthalmic solution Commonly known as:  ZADITOR Place 1 drop into both eyes 2 (two) times daily as needed (allergies).   levocetirizine 5 MG tablet Commonly known as:  XYZAL Take 1 tablet (5 mg total) by mouth daily as needed. What changed:  reasons to take this   levothyroxine 50 MCG tablet Commonly known as:  SYNTHROID, LEVOTHROID Take 50 mcg by mouth daily before breakfast. Brand Name only   Magnesium 500 MG Tabs Take 100 mg by mouth at bedtime.   Melatonin 5 MG Tabs Take 1 tablet by mouth at bedtime. For sleep   methocarbamol 500 MG tablet Commonly known as:  ROBAXIN Take 1 tablet (500 mg total) by  mouth every 6 (six) hours as needed for muscle spasms.   multivitamin with minerals tablet Take 1 tablet by mouth daily.   omeprazole 40 MG capsule Commonly known  as:  PRILOSEC Take 40 mg by mouth 2 (two) times daily.   pantoprazole 40 MG tablet Commonly known as:  PROTONIX Take 40 mg by mouth 2 (two) times daily.   Phenylephrine-guaiFENesin 10-400 MG Tabs Take 1 tablet by mouth every 4 (four) hours as needed (congestion).   predniSONE 10 MG (48) Tbpk tablet Commonly known as:  STERAPRED UNI-PAK 48 TAB 12 day dose pack. Take by mouth as directed for 12 days   pyridoxine 100 MG tablet Commonly known as:  B-6 Take 100 mg by mouth daily.   sodium chloride 0.65 % Soln nasal spray Commonly known as:  OCEAN Place 1 spray into both nostrils 2 (two) times daily.   sucralfate 1 g tablet Commonly known as:  CARAFATE Take 1 g by mouth 3 (three) times daily between meals.   vitamin B-12 500 MCG tablet Commonly known as:  CYANOCOBALAMIN Take 500 mcg by mouth daily.   zinc gluconate 50 MG tablet Take 50 mg by mouth daily.            Durable Medical Equipment  (From admission, onward)        Start     Ordered   02/23/18 1156  For home use only DME Cane  Once    Comments:  quad   02/23/18 1155      Diagnostic Studies: Dg Shoulder Right Port  Result Date: 02/22/2018 CLINICAL DATA:  Status post reverse shoulder arthroplasty EXAM: PORTABLE RIGHT SHOULDER: 2 V COMPARISON:  Right shoulder MRI January 21, 2018 FINDINGS: Frontal and Y scapular images obtained. Patient is status post total shoulder replacement with prosthetic components well-seated. No fracture or dislocation. No appreciable arthropathy. There are overlying skin staples. Visualized right lung clear. IMPRESSION: Prosthetic components well-seated.  No fracture or dislocation. Electronically Signed   By: Lowella Grip III M.D.   On: 02/22/2018 13:19    Disposition:  Plan will be for discharge home this  afternoon.  Signed: Judson Roch PA-C 02/24/2018, 1:10 PM

## 2018-02-23 NOTE — Care Management Note (Signed)
Case Management Note  Patient Details  Name: Vanessa Evans MRN: [id number] Date of Birth: September 18, 1945  Subjective/Objective:  s/p right reverse total shoulder replacement.   Met with patient at bedside to discuss discharge planning. Patient will need a 4 prong cane. Ordered from [place] with [facility]. Offered a list of home care agencies. Referral to [facility] for [place] and [place].                     Action/Plan: It is anticipated that patient will discharge later today with HHPT/HHOT with [facility].   Expected Discharge Date:  02/25/18               Expected Discharge Plan:  [place]  In-House Referral:     Discharge planning Services  CM Consult  Post Acute Care Choice:  Durable Medical Equipment, Home Health Choice offered to:  Patient  DME Arranged:    DME Agency:  [facility] Arranged:  PT, OT [facility] Agency:  [facility] (formerly [facility])  Status of Service:  Completed, signed off  If discussed at [employer] of Stay Meetings, dates discussed:    Additional Comments:  Jolly Mango, RN 02/23/2018, 3:00 PM

## 2018-02-23 NOTE — Progress Notes (Signed)
Physical Therapy Treatment Patient Details Name: Vanessa Evans MRN: [id number] DOB: [birth year] Today's Date: 02/23/2018    History of Present Illness Patient is 72 yo female s/p R reverse total shoulder arthroplasty and excision of large periarticular lipoma 02/22/18. PMH of asthma, palpitations, HTN, PAD, paroxysmal supraventricular tachycardia,GERD, anxiety, depression, hypothyroidism    PT Comments    Patient alert and agreeable to therapy at start of session, R shoulder pain 8/10. BP 123/66, HR 70, spO2 >90%. The patient was able to mobilized to EOB with supervision, stated she was a little lightheaded, exhibited shaking, HR 68-72 sp O2> 90%. Light headedness improved with time, able to perform sit<>stand with CGA and quad cane, cued for deep breaths and relaxing body to help lightheadedness. Patient report mild improvement in lightheadedness, able to ambulate in room with quad cane and CGA, up in chair at end of session all needs in reach, vitals stable throughout. Patient fatigued at end of session. The patient would benefit from further skilled PT to continue to address limitations and return patient to PLOF.    Follow Up Recommendations  Home health PT     Equipment Recommendations  Other (comment)(quad cane (small base))    Recommendations for Other Services       Precautions / Restrictions Precautions Precautions: Shoulder;Fall Shoulder Interventions: Shoulder abduction pillow;Shoulder sling/immobilizer;At all times;Off for dressing/bathing/exercises Precaution Booklet Issued: Yes (comment) Required Braces or Orthoses: Sling Restrictions Weight Bearing Restrictions: Yes RUE Weight Bearing: Non weight bearing    Mobility  Bed Mobility Overal bed mobility: Needs Assistance Bed Mobility: Supine to Sit     Supine to sit: HOB elevated;Supervision      Transfers Overall transfer level: Needs assistance Equipment used: 1 person hand held assist Transfers: Sit  to/from Stand Sit to Stand: Min guard         General transfer comment: Patient reports some light headedness/shaking with sitting EOB, vitals stable, hand held assist to stand  Ambulation/Gait Ambulation/Gait assistance: Min guard Gait Distance (Feet): 20 Feet Assistive device: Quad cane       General Gait Details: Patient slightly unsteady/shaky with quad cane, says she normally is slightly shaky at baseline.   Stairs             Wheelchair Mobility    Modified Rankin (Stroke Patients Only)       Balance Overall balance assessment: Mild deficits observed, not formally tested                                          Cognition Arousal/Alertness: Awake/alert Behavior During Therapy: WFL for tasks assessed/performed Overall Cognitive Status: Within Functional Limits for tasks assessed                                        Exercises    General Comments        Pertinent Vitals/Pain Pain Assessment: 0-10 Pain Score: 8  Pain Location: R shoulder Pain Descriptors / Indicators: Aching Pain Intervention(s): Monitored during session;Patient requesting pain meds-RN notified;Repositioned;Ice applied;Limited activity within patient's tolerance    Home Living Family/patient expects to be discharged to:: Private residence Living Arrangements: Spouse/significant other Available Help at Discharge: Family(spouse is out of the house a couple times per day ) Type of Home: House Home Access: Stairs to enter Entrance  Stairs-Rails: Right Home Layout: One level Home Equipment: Grab bars - toilet;Bedside commode;Grab bars - tub/shower;Walker - 4 wheels;Shower seat - built in;Cane - single point;Adaptive equipment      Prior Function Level of Independence: Independent      Comments: Pt indep with mobility, ADL, IADL, including driving. No falls but endorses many "near misses" or "stumbles" and holds onto furniture/walls for support    PT Goals (current goals can now be found in the care plan section) Acute Rehab PT Goals Patient Stated Goal: to learn what I need to and go home Progress towards PT goals: Progressing toward goals    Frequency    BID      PT Plan Current plan remains appropriate    Co-evaluation              AM-PAC PT "6 Clicks" Daily Activity  Outcome Measure  Difficulty turning over in bed (including adjusting bedclothes, sheets and blankets)?: None Difficulty moving from lying on back to sitting on the side of the bed? : A Little Difficulty sitting down on and standing up from a chair with arms (e.g., wheelchair, bedside commode, etc,.)?: A Little Help needed moving to and from a bed to chair (including a wheelchair)?: A Little Help needed walking in hospital room?: A Little Help needed climbing 3-5 steps with a railing? : A Little 6 Click Score: 19    End of Session Equipment Utilized During Treatment: Gait belt Activity Tolerance: Patient limited by fatigue Patient left: in chair;with chair alarm set;with SCD's reapplied;with call bell/phone within reach(heels elevated) Nurse Communication: Mobility status PT Visit Diagnosis: Unsteadiness on feet (R26.81);Muscle weakness (generalized) (M62.81)     Time: 0940-7680 PT Time Calculation (min) (ACUTE ONLY): 23 min  Charges:  $Gait Training: 8-22 mins $Therapeutic Activity: 8-22 mins                    G Codes:       Lieutenant Diego PT, DPT 11:40 AM,02/23/18 [phone]

## 2018-02-23 NOTE — Progress Notes (Signed)
Clinical Social Worker (CSW) received SNF consult. PT is recommending home health. RN case manager aware of above. Please reconsult if future social work needs arise. CSW signing off.   Waynetta Metheny, LCSW [phone] 

## 2018-02-23 NOTE — Progress Notes (Addendum)
Subjective: 1 Day Post-Op Procedure(s) (LRB): REVERSE SHOULDER ARTHROPLASTY (Right) Patient reports pain as 8 out of 10 this AM, nerve block is beginning to wear off this AM.   Patient is well, and has had no acute complaints or problems Plan is to go Home after hospital stay. Negative for chest pain and shortness of breath Fever: no Gastrointestinal:Negative for nausea and vomiting  Objective: Vital signs in last 24 hours: Temp:  [96.2 F (35.7 C)-98 F (36.7 C)] 97.4 F (36.3 C) (07/17 0750) Pulse Rate:  [59-97] 59 (07/17 0750) Resp:  [12-20] 18 (07/17 0750) BP: (101-158)/(57-84) 132/57 (07/17 0750) SpO2:  [2 %-100 %] 100 % (07/17 0750) Weight:  [60.8 kg (134 lb 1 oz)] 60.8 kg (134 lb 1 oz) (07/16 0907)  Intake/Output from previous day:  Intake/Output Summary (Last 24 hours) at 02/23/2018 0754 Last data filed at 02/23/2018 0400 Gross per 24 hour  Intake 2254.17 ml  Output 1575 ml  Net 679.17 ml    Intake/Output this shift: No intake/output data recorded.  Labs: Recent Labs    02/23/18 0325  HGB 11.6*   Recent Labs    02/23/18 0325  WBC 10.5  RBC 4.09  HCT 34.6*  PLT 249   Recent Labs    02/23/18 0325  NA 140  K 2.8*  CL 107  CO2 25  BUN 7*  CREATININE 0.53  GLUCOSE 117*  CALCIUM 8.6*   No results for input(s): LABPT, INR in the last 72 hours.   EXAM General - Patient is Alert, Appropriate and Oriented Extremity - ABD soft Sensation intact distally Intact pulses distally Incision: dressing C/D/I No cellulitis present  Pt is intact to light touch to the right arm, including over the lateral deltoid. Able to flex and extend fingers without pain. Dressing/Incision - clean, dry, no drainage Motor Function - intact, moving foot and toes well on exam.  Abdomen soft with normal BS.  Past Medical History:  Diagnosis Date  . Anxiety   . Arthritis    osteo  . Asthma    needs rescue inhaler few times a year  . Depression   . Dizziness    light headed spells but it has been a while  . Dysrhythmia    tachycardia.(cardizem). brady during procedures  . GERD (gastroesophageal reflux disease)    gastritis  . Headache(784.0)   . Heart murmur 2019   aortic. dr. Nehemiah Massed not worried about this  . Hyperlipidemia   . Hypertension   . Hypothyroidism   . Migraine   . Orthopnea   . Pneumonia    long time ago (greater than 5 years ago)  . PONV (postoperative nausea and vomiting)    very anxious during cataract surgery. brady during colonoscopy  . Shortness of breath    any exertion, cannot lie flat    Assessment/Plan: 1 Day Post-Op Procedure(s) (LRB): REVERSE SHOULDER ARTHROPLASTY (Right) Active Problems:   Status post reverse total shoulder replacement, right  Estimated body mass index is 25.33 kg/m as calculated from the following:   Height as of this encounter: 5\' 1"  (1.549 m).   Weight as of this encounter: 60.8 kg (134 lb 1 oz). Advance diet Up with therapy D/C IV fluids when tolerating po intake.  Labs reviewed this AM, BUN 7 and K+ 2.8.  Will supplement with Klor-con and recheck potassium today.  Encouraged increased oral intake to improve BUN. Pt did well with therapy yesterday.  Up with therapy today. Block beginning to wear off at  this time. Will recheck labs, plan will be for discharge home this afternoon pending progress with PT.  DVT Prophylaxis - Lovenox, Foot Pumps and TED hose Non-weightbearing to the right arm.  Raquel James, PA-C [facility] 02/23/2018, 7:54 AM

## 2018-02-23 NOTE — Progress Notes (Signed)
Pt c/o spasms on the neck and right shoulder. Pt had episodes of bradycardia after having PRN Flexeril this morning per report of morning shift RN. On call Dr. Leim Fabry paged and ordered for Robaxin 500mg  PO every 6hrs PRN.

## 2018-02-23 NOTE — Evaluation (Signed)
Occupational Therapy Evaluation Patient Details Name: Vanessa Evans MRN: [id number] DOB: July 24, 1946 Today's Date: 02/23/2018    History of Present Illness Patient is 72 yo female s/p R reverse total shoulder arthroplasty and excision of large periarticular lipoma 02/22/18. PMH of asthma, palpitations, HTN, PAD, paroxysmal supraventricular tachycardia,GERD, anxiety, depression, hypothyroidism   Clinical Impression   Patient was seen for an OT evaluation this date. Pt lives with her spouse in a 1 story home with 2 STE and walk in shower with built in bench. Prior to surgery, pt was active and independent. Pt has orders for RUE to be immobilized and will be NWBing per MD. Patient presents with impaired strength/ROM, pain, sensation to RUE with block not completely resolved yet, and impaired balance. These impairments result in a decreased ability to perform self care tasks requiring min-mod assist for UB/LB dressing and bathing and max assist for application of polar care, compression stockings, and sling/immobilizer. Pt/spouse instructed in polar care mgt, compression stockings mgt, sling/immobilizer mgt, ROM exercises for RUE (with instructions for no shoulder exercises until full sensation has returned), RUE precautions, adaptive strategies for bathing/dressing/toileting/grooming, positioning and considerations for sleep, and home/routines modifications to maximize falls prevention, safety, and independence. Handout provided. OT adjusted sling/immobilizer and polar care to improve comfort, optimize positioning, and to maximize skin integrity/safety. While sitting EOB during polar care and sling instructions, pt stated she began to feel lightheaded. HR in low to mid-50's, O2 sats 100%. RN in room to assess vitals. HR down briefly to 40 and once back in bed back up to 58-60. Pt noting feeling better. Once back in bed with feet elevated and SCD's reapplied. BP 104/54. Pt/spouse verbalized understanding of  all education/training provided. Pt will benefit from skilled OT services to address these limitations and improve independence in daily tasks. Recommend HHOT services to continue therapy to maximize return to PLOF, address home/routines modifications and safety, minimize falls risk, and minimize caregiver burden.       Follow Up Recommendations  Home health OT;Supervision - Intermittent    Equipment Recommendations  None recommended by OT    Recommendations for Other Services       Precautions / Restrictions Precautions Precautions: Shoulder;Fall Shoulder Interventions: Shoulder abduction pillow;Shoulder sling/immobilizer;At all times;Off for dressing/bathing/exercises Precaution Booklet Issued: Yes (comment) Required Braces or Orthoses: Sling Restrictions Weight Bearing Restrictions: Yes RUE Weight Bearing: Non weight bearing      Mobility Bed Mobility Overal bed mobility: Needs Assistance Bed Mobility: Supine to Sit;Sit to Supine     Supine to sit: HOB elevated;Supervision Sit to supine: Min guard      Transfers                 General transfer comment: unable to attempt, pt noted feeling lightheaded    Balance                                           ADL either performed or assessed with clinical judgement   ADL Overall ADL's : Needs assistance/impaired Eating/Feeding: Sitting;Modified independent   Grooming: Sitting;Modified independent Grooming Details (indicate cue type and reason): pt/spouse instructed in techniques to apply deodorant to R underarm Upper Body Bathing: Sitting;Minimal assistance;With caregiver independent assisting Upper Body Bathing Details (indicate cue type and reason): pt/spouse instructed in techniques to wash R underarm Lower Body Bathing: Sitting/lateral leans;With caregiver independent assisting;Minimal assistance   Upper Body  Dressing : Sitting;Minimal assistance;Moderate assistance;With caregiver  independent assisting Upper Body Dressing Details (indicate cue type and reason): pt/spouse instructed in hemi techniques to perform UB dressing while maintaining shoulder precautions Lower Body Dressing: Sit to/from stand;Minimal assistance;With caregiver independent assisting                       Vision Baseline Vision/History: Wears glasses Wears Glasses: At all times Patient Visual Report: No change from baseline       Perception     Praxis      Pertinent Vitals/Pain Pain Assessment: 0-10 Pain Score: 7  Pain Location: R shoulder Pain Descriptors / Indicators: Aching Pain Intervention(s): Limited activity within patient's tolerance;Monitored during session;Premedicated before session;Repositioned;Ice applied     Hand Dominance Right   Extremity/Trunk Assessment Upper Extremity Assessment Upper Extremity Assessment: RUE deficits/detail;LUE deficits/detail RUE Deficits / Details: full wrist/hand AROM, elbow AROM limited by shoulder pain, pt reporting block is wearing off RUE: Unable to fully assess due to pain;Unable to fully assess due to immobilization LUE Deficits / Details: 3+/5   Lower Extremity Assessment Lower Extremity Assessment: Overall WFL for tasks assessed;Defer to PT evaluation(grossly 4/5 bilaterally)   Cervical / Trunk Assessment Cervical / Trunk Assessment: Normal   Communication Communication Communication: No difficulties   Cognition Arousal/Alertness: Awake/alert Behavior During Therapy: WFL for tasks assessed/performed Overall Cognitive Status: Within Functional Limits for tasks assessed                                     General Comments       Exercises Other Exercises Other Exercises: Pt/spouse instructed in comprehensive shoulder discharge instruction sheet for techniques for bathing, dressing, grooming, positioning during sleep, polar care mgt, sling mgt, and ROM ex for hand/wrist/elbow.   Shoulder Instructions       Home Living Family/patient expects to be discharged to:: Private residence Living Arrangements: Spouse/significant other Available Help at Discharge: Family(spouse is out of the house a couple times per day ) Type of Home: House Home Access: Stairs to enter [employer] of Steps: 2  Entrance Stairs-Rails: Right Home Layout: One level     Bathroom Shower/Tub: Occupational psychologist: Handicapped height(has both)     Home Equipment: Grab bars - toilet;Bedside commode;Grab bars - tub/shower;Walker - 4 wheels;Shower seat - built in;Cane - single point;Adaptive equipment Adaptive Equipment: Reacher;Long-handled shoe horn        Prior Functioning/Environment Level of Independence: Independent        Comments: Pt indep with mobility, ADL, IADL, including driving. No falls but endorses many "near misses" or "stumbles" and holds onto furniture/walls for support        OT Problem List: Decreased strength;Decreased knowledge of use of DME or AE;Decreased range of motion;Decreased knowledge of precautions;Cardiopulmonary status limiting activity;Impaired UE functional use;Pain;Impaired sensation;Impaired balance (sitting and/or standing)      OT Treatment/Interventions: Self-care/ADL training;Balance training;Therapeutic exercise;Therapeutic activities;DME and/or AE instruction;Patient/family education    OT Goals(Current goals can be found in the care plan section) Acute Rehab OT Goals Patient Stated Goal: to learn what I need to and go home OT Goal Formulation: With patient/family Time For Goal Achievement: 03/09/18 Potential to Achieve Goals: Good ADL Goals Pt Will Perform Upper Body Dressing: with caregiver independent in assisting;sitting Pt/caregiver will Perform Home Exercise Program: Increased ROM;Right Upper extremity;With written HEP provided Additional ADL Goal #1: Pt will independently instruct  spouse in polar care mgt/positioning to maximize  adherence to recovery protocol. Additional ADL Goal #2: Pt will independently instruct spouse in sling/immobilizer mgt/positioning to maximize adherence to recovery protocol.  OT Frequency: Min 2X/week   Barriers to D/C:            Co-evaluation              AM-PAC PT "6 Clicks" Daily Activity     Outcome Measure Help from another person eating meals?: None Help from another person taking care of personal grooming?: None Help from another person toileting, which includes using toliet, bedpan, or urinal?: A Little Help from another person bathing (including washing, rinsing, drying)?: A Little Help from another person to put on and taking off regular upper body clothing?: A Lot Help from another person to put on and taking off regular lower body clothing?: A Little 6 Click Score: 19   End of Session    Activity Tolerance: Other (comment)(limited by pt's lightheadedness) Patient left: in bed;with call bell/phone within reach;with bed alarm set;with family/visitor present;Other (comment);with SCD's reapplied(polar care and sling in place)  OT Visit Diagnosis: Other abnormalities of gait and mobility (R26.89);Muscle weakness (generalized) (M62.81);Pain Pain - Right/Left: Right Pain - part of body: Shoulder                Time: 4174-0814 OT Time Calculation (min): 63 min Charges:  OT General Charges $OT Visit: 1 Visit OT Evaluation $OT Eval Moderate Complexity: 1 Mod OT Treatments $Self Care/Home Management : 38-52 mins   Jeni Salles, MPH, MS, OTR/L ascom [phone] 02/23/18, 9:49 AM

## 2018-02-24 LAB — BASIC METABOLIC PANEL
ANION GAP: 5 (ref 5–15)
BUN: 8 mg/dL (ref 8–23)
CALCIUM: 8.6 mg/dL — AB (ref 8.9–10.3)
CO2: 26 mmol/L (ref 22–32)
Chloride: 105 mmol/L (ref 98–111)
Creatinine, Ser: 0.62 mg/dL (ref 0.44–1.00)
GFR calc Af Amer: >60 mL/min (ref >60)
GLUCOSE: 137 mg/dL — AB (ref 70–99)
POTASSIUM: 4 mmol/L (ref 3.5–5.1)
SODIUM: 136 mmol/L (ref 135–145)

## 2018-02-24 MED ORDER — METHOCARBAMOL 500 MG PO TABS: 500.0000 mg | ORAL_TABLET | Freq: Four times a day (QID) | ORAL | 0 refills | Status: DC | PRN

## 2018-02-24 NOTE — Progress Notes (Signed)
Per chart review, patient scheduled for discharge this PM

## 2018-02-24 NOTE — Progress Notes (Signed)
Subjective: 2 Days Post-Op Procedure(s) (LRB): REVERSE SHOULDER ARTHROPLASTY (Right) Patient reports pain as mild. Patient is well, and has had no acute complaints or problems Plan is to go Home after hospital stay. Negative for chest pain and shortness of breath Fever: no Gastrointestinal:Negative for nausea and vomiting  Objective: Vital signs in last 24 hours: Temp:  [97.9 F (36.6 C)-99 F (37.2 C)] 97.9 F (36.6 C) (07/18 0730) Pulse Rate:  [69-79] 75 (07/18 1028) Resp:  [19] 19 (07/17 2245) BP: (104-125)/(52-54) 125/52 (07/18 1028) SpO2:  [96 %-99 %] 98 % (07/18 1028)  Intake/Output from previous day:  Intake/Output Summary (Last 24 hours) at 02/24/2018 1307 Last data filed at 02/24/2018 1023 Gross per 24 hour  Intake 960 ml  Output -  Net 960 ml    Intake/Output this shift: Total I/O In: 360 [P.O.:360] Out: -   Labs: Recent Labs    02/23/18 0325  HGB 11.6*   Recent Labs    02/23/18 0325  WBC 10.5  RBC 4.09  HCT 34.6*  PLT 249   Recent Labs    02/23/18 0325 02/23/18 1407 02/24/18 0309  NA 140  --  136  K 2.8* 3.7 4.0  CL 107  --  105  CO2 25  --  26  BUN 7*  --  8  CREATININE 0.53  --  0.62  GLUCOSE 117*  --  137*  CALCIUM 8.6*  --  8.6*   No results for input(s): LABPT, INR in the last 72 hours.   EXAM General - Patient is Alert, Appropriate and Oriented Extremity - ABD soft Sensation intact distally Intact pulses distally Incision: dressing C/D/I No cellulitis present  Pt is intact to light touch to the right arm, including over the lateral deltoid. Able to flex and extend fingers without pain. Dressing/Incision - clean, dry, no drainage Motor Function - intact, moving foot and toes well on exam.  Abdomen soft with normal BS.  Past Medical History:  Diagnosis Date  . Anxiety   . Arthritis    osteo  . Asthma    needs rescue inhaler few times a year  . Depression   . Dizziness    light headed spells but it has been a while   . Dysrhythmia    tachycardia.(cardizem). brady during procedures  . GERD (gastroesophageal reflux disease)    gastritis  . Headache(784.0)   . Heart murmur 2019   aortic. dr. Nehemiah Massed not worried about this  . Hyperlipidemia   . Hypertension   . Hypothyroidism   . Migraine   . Orthopnea   . Pneumonia    long time ago (greater than 5 years ago)  . PONV (postoperative nausea and vomiting)    very anxious during cataract surgery. brady during colonoscopy  . Shortness of breath    any exertion, cannot lie flat    Assessment/Plan: 2 Days Post-Op Procedure(s) (LRB): REVERSE SHOULDER ARTHROPLASTY (Right) Active Problems:   Status post reverse total shoulder replacement, right  Estimated body mass index is 25.33 kg/m as calculated from the following:   Height as of this encounter: 5\' 1"  (1.549 m).   Weight as of this encounter: 60.8 kg (134 lb 1 oz). Advance diet Up with therapy D/C IV fluids when tolerating po intake.  Hypokalemia resolved. Pt has walked 120 feet with PT. Pt has had a BM Plan for discharge home this afternoon.  DVT Prophylaxis - Lovenox, Foot Pumps and TED hose Non-weightbearing to the right arm.  J.  Cameron Proud, PA-C [facility] 02/24/2018, 1:07 PM

## 2018-02-24 NOTE — Progress Notes (Signed)
Physical Therapy Treatment Patient Details Name: Vanessa Evans MRN: [id number] DOB: Feb 17, 1946 Today's Date: 02/24/2018    History of Present Illness Patient is 72 yo female s/p R reverse total shoulder arthroplasty and excision of large periarticular lipoma 02/22/18. PMH of asthma, palpitations, HTN, PAD, paroxysmal supraventricular tachycardia,GERD, anxiety, depression, hypothyroidism    PT Comments    Patient alert up in bed at start of session, agreeable with PT, R shoulder pain 4/10. Patient able to mobilize to EOB and perform sit <> stand transfers with supervision. Ambulated to commode with quad cane supervision/CGA, able to perform toileting activities with supervision. Patient ambulated ~174ft with quad cane, education provided via visual/demo of proper use of quad cane with learning demonstrated by patient. Vitals stable throughout, less complaints of lightheadness/no dizziness this session. The patient would benefit from further skilled PT to continue to address changes from PLOF and continued education.    Follow Up Recommendations  Home health PT     Equipment Recommendations  Other (comment)(quad cane (small base))    Recommendations for Other Services       Precautions / Restrictions Precautions Precautions: Shoulder;Fall Required Braces or Orthoses: Sling Restrictions Weight Bearing Restrictions: Yes RUE Weight Bearing: Non weight bearing    Mobility  Bed Mobility Overal bed mobility: Needs Assistance Bed Mobility: Supine to Sit     Supine to sit: HOB elevated;Supervision        Transfers Overall transfer level: Needs assistance Equipment used: Quad cane Transfers: Sit to/from Stand Sit to Stand: Supervision         General transfer comment: Patient reports mild lightheadedness when first coming to stand, vitals stable, resolved within ~53min.  Ambulation/Gait Ambulation/Gait assistance: Supervision;Min guard Gait Distance (Feet): 120  Feet Assistive device: Quad cane       General Gait Details: Patient slightly unsteady, education about quad cane use, still tends to walk towards R side.   Stairs             Wheelchair Mobility    Modified Rankin (Stroke Patients Only)       Balance Overall balance assessment: Mild deficits observed, not formally tested                                          Cognition                                              Exercises Other Exercises Other Exercises: Patient ambulated to bathroom with supervision/CGA with quad cane, performed toileting needs with supervision.    General Comments        Pertinent Vitals/Pain Pain Assessment: 0-10 Pain Score: 4  Pain Location: R shoulder Pain Descriptors / Indicators: Aching Pain Intervention(s): Monitored during session;Ice applied;Premedicated before session    Home Living                      Prior Function            PT Goals (current goals can now be found in the care plan section) Progress towards PT goals: Progressing toward goals    Frequency    BID      PT Plan Current plan remains appropriate    Co-evaluation  AM-PAC PT "6 Clicks" Daily Activity  Outcome Measure  Difficulty turning over in bed (including adjusting bedclothes, sheets and blankets)?: None Difficulty moving from lying on back to sitting on the side of the bed? : A Little Difficulty sitting down on and standing up from a chair with arms (e.g., wheelchair, bedside commode, etc,.)?: A Little Help needed moving to and from a bed to chair (including a wheelchair)?: A Little Help needed walking in hospital room?: A Little Help needed climbing 3-5 steps with a railing? : A Little 6 Click Score: 19    End of Session Equipment Utilized During Treatment: Gait belt Activity Tolerance: Patient limited by fatigue Patient left: in chair;with chair alarm set;with SCD's  reapplied;with call bell/phone within reach Nurse Communication: Mobility status PT Visit Diagnosis: Unsteadiness on feet (R26.81);Muscle weakness (generalized) (M62.81)     Time: 2947-6546 PT Time Calculation (min) (ACUTE ONLY): 33 min  Charges:  $Gait Training: 8-22 mins $Therapeutic Activity: 8-22 mins                    G Codes:       Lieutenant Diego PT, DPT 9:15 AM,02/24/18 [phone]

## 2018-02-24 NOTE — Progress Notes (Signed)
Discharge summary reviewed with verbal understanding. RXs given to spouse. Escorted to personal vehicle via [facility]

## 2018-02-24 NOTE — Progress Notes (Signed)
Gave Pt's spouse Rx.

## 2018-02-24 NOTE — Discharge Instructions (Signed)
Diet: As you were doing prior to hospitalization   Shower:  May shower but keep the wounds dry, use an occlusive plastic wrap, NO SOAKING IN TUB.  If the bandage gets wet, change with a clean dry gauze.  Dressing:  You may change your dressing as needed. Change the dressing with sterile gauze dressing.    Activity:  Increase activity slowly as tolerated, but follow the weight bearing instructions below.  No lifting or driving for 6 weeks.  Weight Bearing:   Non-weightbearing to the right arm.  Blood Clot Prevention:  Take 1 81mg  aspirin in the morning and one in the evening.  Wear TED Hose stockings consistently for the first two weeks.  Don't need to wear at night. Use Polar Care unit for the first two weeks consistently, especially following therapy.  To prevent constipation: you may use a stool softener such as -  Colace (over the counter) 100 mg by mouth twice a day  Drink plenty of fluids (prune juice may be helpful) and high fiber foods Miralax (over the counter) for constipation as needed.    Itching:  If you experience itching with your medications, try taking only a single pain pill, or even half a pain pill at a time.  You may take up to 10 pain pills per day, and you can also use benadryl over the counter for itching or also to help with sleep.   Precautions:  If you experience chest pain or shortness of breath - call 911 immediately for transfer to the hospital emergency department!!  If you develop a fever greater that 101 F, purulent drainage from wound, increased redness or drainage from wound, or calf pain-Call [place]                                              Follow- Up Appointment:  Please call for an appointment to be seen in 2 weeks at [facility].

## 2018-02-24 NOTE — Progress Notes (Signed)
Occupational Therapy Treatment Patient Details Name: Vanessa Evans MRN: [id number] DOB: 01-20-1946 Today's Date: 02/24/2018    History of present illness Patient is 72 yo female who was admitted for a right reverse total shoulder arthroplasty and excision of large periarticular lipoma 02/22/18. PMH of asthma, palpitations, HTN, PAD, paroxysmal supraventricular tachycardia,GERD, anxiety, depression, hypothyroidism   OT comments  Pt. education was provided about A/E use for LE ADLs. Pt. required assist for A/E set-up, and one armed techniques for donning, and doffing shoes, and socks. Pt. education was provided about one armed dressing techniques for UE dressing, and brace care. Pt. Continues to benefit from OT services for ADL training, A/E training, one armed dressing techniques, dressing compensatory strategies.and pt. education about home modification, and DME. Pt. would benefitt from SNF level of care upon discharge. Pt. Could benefit from follow-up OT services at discharge.   Follow Up Recommendations  Home health OT;Supervision - Intermittent    Equipment Recommendations  None recommended by OT    Recommendations for Other Services      Precautions / Restrictions Precautions Precautions: Shoulder;Fall Shoulder Interventions: Shoulder abduction pillow;Shoulder sling/immobilizer;At all times;Off for dressing/bathing/exercises Required Braces or Orthoses: Sling Restrictions Weight Bearing Restrictions: Yes RUE Weight Bearing: Non weight bearing                                                     ADL either performed or assessed with clinical judgement   ADL Overall ADL's : Needs assistance/impaired Eating/Feeding: Sitting;Modified independent   Grooming: Sitting;Modified independent   Upper Body Bathing: Minimal assistance   Lower Body Bathing: Minimal assistance   Upper Body Dressing : Moderate assistance   Lower Body Dressing: Moderate  assistance                 General ADL Comments: Pt. education was provided about one armed dressing techniques, sling/brace care, and A/E use for LE ADLs.     Vision Baseline Vision/History: Wears glasses Wears Glasses: At all times Patient Visual Report: No change from baseline     Perception     Praxis      Cognition Arousal/Alertness: Awake/alert Behavior During Therapy: WFL for tasks assessed/performed Overall Cognitive Status: Within Functional Limits for tasks assessed                                          Exercises     Shoulder Instructions       General Comments      Pertinent Vitals/ Pain       Pain Assessment: 0-10 Pain Score: 4  Pain Location: R shoulder Pain Descriptors / Indicators: Aching Pain Intervention(s): Monitored during session;Repositioned  Home Living                                          Prior Functioning/Environment              Frequency  Min 2X/week        Progress Toward Goals  OT Goals(current goals can now be found in the care plan section)  Progress towards OT goals: Progressing toward goals  Acute Rehab OT  Goals Patient Stated Goal: to learn what I need to and go home OT Goal Formulation: With patient/family Potential to Achieve Goals: Good  Plan Discharge plan remains appropriate    Co-evaluation                 AM-PAC PT "6 Clicks" Daily Activity     Outcome Measure   Help from another person eating meals?: None Help from another person taking care of personal grooming?: None Help from another person toileting, which includes using toliet, bedpan, or urinal?: A Little Help from another person bathing (including washing, rinsing, drying)?: A Little Help from another person to put on and taking off regular upper body clothing?: A Lot Help from another person to put on and taking off regular lower body clothing?: A Little 6 Click Score: 19    End of  Session    OT Visit Diagnosis: Other abnormalities of gait and mobility (R26.89);Muscle weakness (generalized) (M62.81);Pain Pain - part of body: Shoulder   Activity Tolerance Patient tolerated treatment well   Patient Left in chair;with call bell/phone within reach;with chair alarm set   Nurse Communication          Time: 0920-1000 OT Time Calculation (min): 40 min  Charges: OT General Charges $OT Visit: 1 Visit OT Treatments $Self Care/Home Management : 38-52 mins  Harrel Carina, MS, OTR/L   Harrel Carina 02/24/2018, 5:02 PM

## 2018-02-25 ENCOUNTER — Other Ambulatory Visit: Payer: Self-pay

## 2018-02-25 LAB — SURGICAL PATHOLOGY

## 2018-02-26 DIAGNOSIS — J45909 Unspecified asthma, uncomplicated: Secondary | ICD-10-CM | POA: Diagnosis not present

## 2018-02-26 DIAGNOSIS — I10 Essential (primary) hypertension: Secondary | ICD-10-CM | POA: Diagnosis not present

## 2018-02-26 DIAGNOSIS — Z471 Aftercare following joint replacement surgery: Secondary | ICD-10-CM | POA: Diagnosis not present

## 2018-02-26 DIAGNOSIS — I700 Atherosclerosis of aorta: Secondary | ICD-10-CM | POA: Diagnosis not present

## 2018-02-26 DIAGNOSIS — I472 Ventricular tachycardia: Secondary | ICD-10-CM | POA: Diagnosis not present

## 2018-02-26 DIAGNOSIS — F329 Major depressive disorder, single episode, unspecified: Secondary | ICD-10-CM | POA: Diagnosis not present

## 2018-02-27 NOTE — Procedures (Signed)
[place], [place] [address]  DATE OF SERVICE: February 11, 2018  CAROTID DOPPLER INTERPRETATION:  Bilateral Carotid Ultrsasound and Color Doppler Examination was performed. The RIGHT CCA shows  mild plaque in the vessel. The LEFT CCA shows  mild plaque in the vessel. There was  no intimal thickening noted in the RIGHT carotid artery. There was  no intimal thickening in the LEFT carotid artery.  The RIGHT CCA shows peak systolic velocity of  70YF per second. The end diastolic velocity is  74BS per second on the RIGHT side. The RIGHT ICA shows peak systolic velocity of  78 per second. RIGHT sided ICA end diastolic velocity is  49QP per second. The RIGHT ECA shows a peak systolic velocity of  591MB per second. The ICA/CCA ratio is calculated to be  1.2. This suggests  Less than 50% stenosis. The Vertebral Artery shows  antegrade flow.  The LEFT CCA shows peak systolic velocity of  84YK per second. The end diastolic velocity is  8cm per second on the LEFT side. The LEFT ICA shows peak systolic velocity of  59DJT second. LEFT sided ICA end diastolic velocity is  70VX per second. The LEFT ECA shows a peak systolic velocity of  79TJ per second. The ICA/CCA ratio is calculated to be  0.9. This suggests  Less than 50% stenosis. The Vertebral Artery shows  antegrade flow.   Impression:    The RIGHT CAROTID shows  Less than 50% stenosis. The LEFT CAROTID shows  Less than 50% stenosis.  There is  mild plaque formation noted on the LEFT and  Mild plaque on the RIGHT  side. Consider a repeat Carotid doppler if clinical situation and symptoms warrant in 6-12 months. Patient should be encouraged to change lifestyles such as smoking cessation, regular exercise and dietary modification. Use of statins in the right clinical setting and ASA is encouraged.  Allyne Gee, MD [facility] Pulmonary Critical Care Medicine

## 2018-02-28 DIAGNOSIS — Z471 Aftercare following joint replacement surgery: Secondary | ICD-10-CM | POA: Diagnosis not present

## 2018-02-28 DIAGNOSIS — I10 Essential (primary) hypertension: Secondary | ICD-10-CM | POA: Diagnosis not present

## 2018-02-28 DIAGNOSIS — F329 Major depressive disorder, single episode, unspecified: Secondary | ICD-10-CM | POA: Diagnosis not present

## 2018-02-28 DIAGNOSIS — J45909 Unspecified asthma, uncomplicated: Secondary | ICD-10-CM | POA: Diagnosis not present

## 2018-02-28 DIAGNOSIS — I472 Ventricular tachycardia: Secondary | ICD-10-CM | POA: Diagnosis not present

## 2018-02-28 DIAGNOSIS — I700 Atherosclerosis of aorta: Secondary | ICD-10-CM | POA: Diagnosis not present

## 2018-03-01 DIAGNOSIS — I472 Ventricular tachycardia: Secondary | ICD-10-CM | POA: Diagnosis not present

## 2018-03-01 DIAGNOSIS — I10 Essential (primary) hypertension: Secondary | ICD-10-CM | POA: Diagnosis not present

## 2018-03-01 DIAGNOSIS — F329 Major depressive disorder, single episode, unspecified: Secondary | ICD-10-CM | POA: Diagnosis not present

## 2018-03-01 DIAGNOSIS — I700 Atherosclerosis of aorta: Secondary | ICD-10-CM | POA: Diagnosis not present

## 2018-03-01 DIAGNOSIS — Z471 Aftercare following joint replacement surgery: Secondary | ICD-10-CM | POA: Diagnosis not present

## 2018-03-01 DIAGNOSIS — J45909 Unspecified asthma, uncomplicated: Secondary | ICD-10-CM | POA: Diagnosis not present

## 2018-03-03 DIAGNOSIS — I10 Essential (primary) hypertension: Secondary | ICD-10-CM | POA: Diagnosis not present

## 2018-03-03 DIAGNOSIS — I700 Atherosclerosis of aorta: Secondary | ICD-10-CM | POA: Diagnosis not present

## 2018-03-03 DIAGNOSIS — Z471 Aftercare following joint replacement surgery: Secondary | ICD-10-CM | POA: Diagnosis not present

## 2018-03-03 DIAGNOSIS — J45909 Unspecified asthma, uncomplicated: Secondary | ICD-10-CM | POA: Diagnosis not present

## 2018-03-03 DIAGNOSIS — F329 Major depressive disorder, single episode, unspecified: Secondary | ICD-10-CM | POA: Diagnosis not present

## 2018-03-03 DIAGNOSIS — I472 Ventricular tachycardia: Secondary | ICD-10-CM | POA: Diagnosis not present

## 2018-03-04 ENCOUNTER — Other Ambulatory Visit: Payer: Self-pay | Admitting: Nurse Practitioner

## 2018-03-04 ENCOUNTER — Telehealth: Payer: Self-pay

## 2018-03-04 DIAGNOSIS — R300 Dysuria: Secondary | ICD-10-CM

## 2018-03-04 DIAGNOSIS — R062 Wheezing: Secondary | ICD-10-CM

## 2018-03-04 DIAGNOSIS — N390 Urinary tract infection, site not specified: Secondary | ICD-10-CM

## 2018-03-04 DIAGNOSIS — J0141 Acute recurrent pansinusitis: Secondary | ICD-10-CM

## 2018-03-04 MED ORDER — PHENAZOPYRIDINE HCL 200 MG PO TABS: 200.0000 mg | ORAL_TABLET | Freq: Three times a day (TID) | ORAL | 0 refills | Status: DC | PRN

## 2018-03-04 MED ORDER — AMOXICILLIN-POT CLAVULANATE 875-125 MG PO TABS: 1.0000 | ORAL_TABLET | Freq: Two times a day (BID) | ORAL | 0 refills | Status: DC

## 2018-03-04 NOTE — Progress Notes (Signed)
Pt c/o symptoms associated with UTI. started augmentin 875mg  twice daily for 10 days. Also gave rx for pyridium which helps with bladder pain and spasms. This can be taken up to three times daily. Will cause urine to be orange color. Both sent to her pharmach.

## 2018-03-04 NOTE — Telephone Encounter (Signed)
Pt c/o symptoms associated with UTI. started augmentin 875mg  twice daily for 10 days. Also gave rx for pyridium which helps with bladder pain and spasms. This can be taken up to three times daily. Will cause urine to be orange color. Both sent to her pharmach.

## 2018-03-04 NOTE — Telephone Encounter (Signed)
PT WAS NOTIFIED. 

## 2018-03-07 ENCOUNTER — Ambulatory Visit (INDEPENDENT_AMBULATORY_CARE_PROVIDER_SITE_OTHER): Payer: Medicare Other

## 2018-03-07 DIAGNOSIS — I700 Atherosclerosis of aorta: Secondary | ICD-10-CM | POA: Diagnosis not present

## 2018-03-07 DIAGNOSIS — Z471 Aftercare following joint replacement surgery: Secondary | ICD-10-CM | POA: Diagnosis not present

## 2018-03-07 DIAGNOSIS — J45909 Unspecified asthma, uncomplicated: Secondary | ICD-10-CM | POA: Diagnosis not present

## 2018-03-07 DIAGNOSIS — F329 Major depressive disorder, single episode, unspecified: Secondary | ICD-10-CM | POA: Diagnosis not present

## 2018-03-07 DIAGNOSIS — J301 Allergic rhinitis due to pollen: Secondary | ICD-10-CM

## 2018-03-07 DIAGNOSIS — I10 Essential (primary) hypertension: Secondary | ICD-10-CM | POA: Diagnosis not present

## 2018-03-07 DIAGNOSIS — I472 Ventricular tachycardia: Secondary | ICD-10-CM | POA: Diagnosis not present

## 2018-03-09 DIAGNOSIS — Z96611 Presence of right artificial shoulder joint: Secondary | ICD-10-CM | POA: Diagnosis not present

## 2018-03-18 DIAGNOSIS — Z471 Aftercare following joint replacement surgery: Secondary | ICD-10-CM | POA: Diagnosis not present

## 2018-03-21 ENCOUNTER — Ambulatory Visit (INDEPENDENT_AMBULATORY_CARE_PROVIDER_SITE_OTHER): Payer: Medicare Other

## 2018-03-21 DIAGNOSIS — J301 Allergic rhinitis due to pollen: Secondary | ICD-10-CM

## 2018-03-21 DIAGNOSIS — Z96611 Presence of right artificial shoulder joint: Secondary | ICD-10-CM | POA: Diagnosis not present

## 2018-03-29 DIAGNOSIS — Z96611 Presence of right artificial shoulder joint: Secondary | ICD-10-CM | POA: Diagnosis not present

## 2018-04-04 ENCOUNTER — Ambulatory Visit: Payer: Medicare Other

## 2018-04-04 DIAGNOSIS — Z96611 Presence of right artificial shoulder joint: Secondary | ICD-10-CM | POA: Diagnosis not present

## 2018-04-06 ENCOUNTER — Ambulatory Visit: Payer: Medicare Other

## 2018-04-08 DIAGNOSIS — J301 Allergic rhinitis due to pollen: Secondary | ICD-10-CM | POA: Diagnosis not present

## 2018-04-08 DIAGNOSIS — Z96611 Presence of right artificial shoulder joint: Secondary | ICD-10-CM | POA: Diagnosis not present

## 2018-04-13 DIAGNOSIS — Z96611 Presence of right artificial shoulder joint: Secondary | ICD-10-CM | POA: Diagnosis not present

## 2018-04-15 DIAGNOSIS — M25511 Pain in right shoulder: Secondary | ICD-10-CM | POA: Diagnosis not present

## 2018-04-18 ENCOUNTER — Ambulatory Visit: Payer: Self-pay

## 2018-04-18 ENCOUNTER — Ambulatory Visit (INDEPENDENT_AMBULATORY_CARE_PROVIDER_SITE_OTHER): Payer: Medicare Other | Admitting: Nurse Practitioner

## 2018-04-18 ENCOUNTER — Ambulatory Visit
Admission: RE | Admit: 2018-04-18 | Discharge: 2018-04-18 | Disposition: A | Discharge status: Home / Self Care | Payer: Medicare Other | Source: Ambulatory Visit | Attending: Nurse Practitioner | Admitting: Nurse Practitioner

## 2018-04-18 ENCOUNTER — Encounter: Payer: Self-pay | Admitting: Nurse Practitioner

## 2018-04-18 ENCOUNTER — Ambulatory Visit (INDEPENDENT_AMBULATORY_CARE_PROVIDER_SITE_OTHER): Payer: Medicare Other

## 2018-04-18 VITALS — BP 165/74 | HR 78 | Resp 16 | Ht 61.0 in | Wt 131.6 lb

## 2018-04-18 DIAGNOSIS — J301 Allergic rhinitis due to pollen: Secondary | ICD-10-CM | POA: Diagnosis not present

## 2018-04-18 DIAGNOSIS — M150 Primary generalized (osteo)arthritis: Secondary | ICD-10-CM | POA: Diagnosis not present

## 2018-04-18 DIAGNOSIS — M25551 Pain in right hip: Secondary | ICD-10-CM

## 2018-04-18 DIAGNOSIS — E559 Vitamin D deficiency, unspecified: Secondary | ICD-10-CM

## 2018-04-18 DIAGNOSIS — Z0001 Encounter for general adult medical examination with abnormal findings: Secondary | ICD-10-CM

## 2018-04-18 DIAGNOSIS — M159 Polyosteoarthritis, unspecified: Secondary | ICD-10-CM

## 2018-04-18 DIAGNOSIS — W08XXXA Fall from other furniture, initial encounter: Secondary | ICD-10-CM | POA: Diagnosis not present

## 2018-04-18 DIAGNOSIS — R059 Cough, unspecified: Secondary | ICD-10-CM

## 2018-04-18 DIAGNOSIS — Z96611 Presence of right artificial shoulder joint: Secondary | ICD-10-CM | POA: Diagnosis not present

## 2018-04-18 DIAGNOSIS — F339 Major depressive disorder, recurrent, unspecified: Secondary | ICD-10-CM | POA: Diagnosis not present

## 2018-04-18 DIAGNOSIS — R300 Dysuria: Secondary | ICD-10-CM | POA: Diagnosis not present

## 2018-04-18 DIAGNOSIS — M1611 Unilateral primary osteoarthritis, right hip: Secondary | ICD-10-CM | POA: Diagnosis not present

## 2018-04-18 DIAGNOSIS — R5383 Other fatigue: Secondary | ICD-10-CM

## 2018-04-18 DIAGNOSIS — E782 Mixed hyperlipidemia: Secondary | ICD-10-CM | POA: Diagnosis not present

## 2018-04-18 DIAGNOSIS — R05 Cough: Secondary | ICD-10-CM

## 2018-04-18 DIAGNOSIS — J4520 Mild intermittent asthma, uncomplicated: Secondary | ICD-10-CM | POA: Diagnosis not present

## 2018-04-18 DIAGNOSIS — M8949 Other hypertrophic osteoarthropathy, multiple sites: Secondary | ICD-10-CM

## 2018-04-18 MED ORDER — CITALOPRAM HYDROBROMIDE 10 MG PO TABS: 10.0000 mg | ORAL_TABLET | Freq: Every day | ORAL | 5 refills | Status: DC

## 2018-04-18 MED ORDER — BENZONATATE 100 MG PO CAPS: 200.0000 mg | ORAL_CAPSULE | Freq: Two times a day (BID) | ORAL | 3 refills | Status: DC | PRN

## 2018-04-18 MED ORDER — ALBUTEROL SULFATE HFA 108 (90 BASE) MCG/ACT IN AERS: 2.0000 | INHALATION_SPRAY | RESPIRATORY_TRACT | 0 refills | Status: DC | PRN

## 2018-04-18 MED ORDER — DICLOFENAC SODIUM 1 % TD GEL: 4.0000 g | Freq: Four times a day (QID) | TRANSDERMAL | 3 refills | Status: DC | PRN

## 2018-04-18 NOTE — Progress Notes (Signed)
[facility] [place], [place] [address]  Internal MEDICINE  Office Visit Note  Patient Name: Vanessa Evans  [id number]  [id number]  Date of Service: 04/27/2018    Pt is here for routine health maintenance examination  Chief Complaint  Patient presents with  . Medicare Wellness    annual wellness visit  . Pain    left ear pain occasionally  . Hip Pain    right upper and lower leg     The patient is reporting right hip pain. After having shoulder replacement surgery, she slipped off the barstool and hit her right hip on the hutch in her home. She has bruising and a knot under the skin which is very tender. Hurts to put weight on her right leg and to walk. Has chronic bursitis in both hips. She has received multiple cortisone injections in the left hip and in the lower back to relieve pain. Sees orthopedics on regular basis.    Current Medication: Outpatient Encounter Medications as of 04/18/2018  Medication Sig  . acetaminophen (TYLENOL) 500 MG tablet Take 1,000 mg by mouth 2 (two) times daily as needed for moderate pain.  Marland Kitchen albuterol (PROVENTIL) (2.5 MG/3ML) 0.083% nebulizer solution Take 3 mLs (2.5 mg total) by nebulization every 6 (six) hours as needed for wheezing or shortness of breath.  Marland Kitchen albuterol (VENTOLIN HFA) 108 (90 Base) MCG/ACT inhaler Inhale 2 puffs into the lungs every 4 (four) hours as needed for wheezing or shortness of breath.  Marland Kitchen aspirin EC 81 MG tablet Take 81 mg by mouth at bedtime.   . Calcium Carbonate-Vitamin D (CALCIUM 600+D) 600-400 MG-UNIT tablet Take 1 tablet by mouth 2 (two) times daily.  . cholecalciferol (VITAMIN D) 1000 UNITS tablet Take 1,000 Units by mouth at bedtime.   . citalopram (CELEXA) 10 MG tablet Take 1 tablet (10 mg total) by mouth at bedtime.  . cyanocobalamin 500 MCG tablet Take 500 mcg by mouth daily.  . diclofenac sodium (VOLTAREN) 1 % GEL Apply 4 g topically 4 (four) times daily as needed (pain).  Marland Kitchen  diltiazem (CARDIZEM CD) 120 MG 24 hr capsule Take 120 mg by mouth at bedtime.   . diphenhydrAMINE (BENADRYL) 25 MG tablet Take 25-50 mg by mouth daily as needed for itching.  . docusate sodium (COLACE) 100 MG capsule Take 200 mg by mouth at bedtime as needed for moderate constipation.   Marland Kitchen EPIPEN 2-PAK 0.3 MG/0.3ML SOAJ injection use as directed for severe allergy reaction  . estradiol (ESTRACE VAGINAL) 0.1 MG/GM vaginal cream Apply a pea-sized amount to urethra at bedtime  . fenofibrate 160 MG tablet take1 tab po daily for chol (Patient taking differently: Take 160 mg by mouth every evening. )  . folic acid (FOLVITE) 536 MCG tablet Take 400 mcg by mouth daily.  Marland Kitchen guaiFENesin (MUCINEX) 600 MG 12 hr tablet Take 600 mg by mouth at bedtime as needed (congestion).  . Hypromellose (ARTIFICIAL TEARS OP) Place 1-2 drops into both eyes daily as needed (dry eyes).  Marland Kitchen ketotifen (ZADITOR) 0.025 % ophthalmic solution Place 1 drop into both eyes 2 (two) times daily as needed (allergies).  Marland Kitchen levocetirizine (XYZAL) 5 MG tablet Take 1 tablet (5 mg total) by mouth daily as needed. (Patient taking differently: Take 5 mg by mouth daily as needed for allergies. )  . levothyroxine (SYNTHROID, LEVOTHROID) 50 MCG tablet Take 50 mcg by mouth daily before breakfast. Brand Name only  . Magnesium 500 MG TABS Take 100 mg  by mouth at bedtime.   . Melatonin 5 MG TABS Take 1 tablet by mouth at bedtime. For sleep   . methocarbamol (ROBAXIN) 500 MG tablet Take 1 tablet (500 mg total) by mouth every 6 (six) hours as needed for muscle spasms.  . Multiple Vitamins-Minerals (MULTIVITAMIN WITH MINERALS) tablet Take 1 tablet by mouth daily.  Marland Kitchen omeprazole (PRILOSEC) 40 MG capsule Take 40 mg by mouth 2 (two) times daily.   Marland Kitchen pyridoxine (B-6) 100 MG tablet Take 100 mg by mouth daily.  . sodium chloride (OCEAN) 0.65 % SOLN nasal spray Place 1 spray into both nostrils 2 (two) times daily.  . sucralfate (CARAFATE) 1 g tablet Take 1 g by mouth  3 (three) times daily between meals.  . zinc gluconate 50 MG tablet Take 50 mg by mouth daily.  . [DISCONTINUED] citalopram (CELEXA) 10 MG tablet Take 1 tablet (10 mg total) by mouth daily. 1/2 to 1 tab every evening for depression / mood. (Patient taking differently: Take 10 mg by mouth at bedtime. )  . [DISCONTINUED] diclofenac sodium (VOLTAREN) 1 % GEL Apply 1 application topically 4 (four) times daily as needed (pain).  . [DISCONTINUED] VENTOLIN HFA 108 (90 Base) MCG/ACT inhaler Take 2 puffs using inhaler four times a day as needed  . acetaminophen (TYLENOL) 650 MG CR tablet Take 1,300 mg by mouth at bedtime as needed for pain.  Marland Kitchen amoxicillin-clavulanate (AUGMENTIN) 875-125 MG tablet Take 1 tablet by mouth 2 (two) times daily. (Patient not taking: Reported on 04/18/2018)  . benzonatate (TESSALON) 100 MG capsule Take 2 capsules (200 mg total) by mouth 2 (two) times daily as needed for cough.  . cyclobenzaprine (FLEXERIL) 10 MG tablet Take 0.5 tablets (5 mg total) by mouth 2 (two) times daily as needed for muscle spasms. (Patient not taking: Reported on 04/18/2018)  . diazepam (VALIUM) 5 MG tablet Take 1 tablet po 1.5 hours pror to procedure. May repeat dose in 1 hour if needed. (Patient not taking: Reported on 02/03/2018)  . HYDROcodone-acetaminophen (NORCO) 7.5-325 MG tablet Take 1-2 tablets by mouth every 4 (four) hours as needed for severe pain (pain score 7-10). (Patient not taking: Reported on 04/18/2018)  . pantoprazole (PROTONIX) 40 MG tablet Take 40 mg by mouth 2 (two) times daily.  . phenazopyridine (PYRIDIUM) 200 MG tablet Take 1 tablet (200 mg total) by mouth 3 (three) times daily as needed for pain. (Patient not taking: Reported on 04/18/2018)  . Phenylephrine-guaiFENesin 10-400 MG TABS Take 1 tablet by mouth every 4 (four) hours as needed (congestion).  . predniSONE (STERAPRED UNI-PAK 48 TAB) 10 MG (48) TBPK tablet 12 day dose pack. Take by mouth as directed for 12 days (Patient not taking:  Reported on 01/24/2018)   No facility-administered encounter medications on file as of 04/18/2018.     Surgical History: Past Surgical History:  Procedure Laterality Date  . 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 BIOPSY THYROID  2018  . bladder biopsies  2003   9 biopsies done by dr. cope.  all benign.  inflammatory process going on in bladder  . BREAST BIOPSY Right    benign  . BREAST SURGERY Right    lumpectomy  . CATARACT EXTRACTION W/PHACO Left 12/25/2014   Procedure: CATARACT EXTRACTION PHACO AND INTRAOCULAR LENS PLACEMENT (IOC);  Surgeon: Birder Robson, MD;  Location: [facility] ORS;  Service: Ophthalmology;  Laterality: Left;  [place] 00:32 AP% 20.0  CDE 6.49  . COLONOSCOPY W/ BIOPSIES    . COLONOSCOPY W/ POLYPECTOMY  2002, 2004, 2005   adenomatous polyps removed  . COLONOSCOPY WITH PROPOFOL N/A 08/23/2017   Procedure: COLONOSCOPY WITH PROPOFOL;  Surgeon: Manya Silvas, MD;  Location: [facility] ENDOSCOPY;  Service: Endoscopy;  Laterality: N/A;  . COLONOSCOPY WITH PROPOFOL N/A 11/08/2017   Procedure: COLONOSCOPY WITH PROPOFOL;  Surgeon: Manya Silvas, MD;  Location: [facility] ENDOSCOPY;  Service: Endoscopy;  Laterality: N/A;  . CYSTOCELE REPAIR N/A 04/09/2016   Procedure: ANTERIOR REPAIR (CYSTOCELE);  Surgeon: Gae Dry, MD;  Location: [facility] ORS;  Service: Gynecology;  Laterality: N/A;  . DIAGNOSTIC LAPAROSCOPY  2008   removed both ovaries with cysts, tubes and fibroids  . DILATION AND CURETTAGE OF UTERUS  1973  . ESOPHAGOGASTRODUODENOSCOPY (EGD) WITH PROPOFOL N/A 08/23/2017   Procedure: ESOPHAGOGASTRODUODENOSCOPY (EGD) WITH PROPOFOL;  Surgeon: Manya Silvas, MD;  Location: [facility] ENDOSCOPY;  Service: Endoscopy;  Laterality: N/A;  . EYE SURGERY Bilateral 2016  . HERNIA REPAIR Right 2008   Inguinal hernia Repair, ventral hernia repair  . JOINT REPLACEMENT Right 2008   knee  . KNEE ARTHROSCOPY Right   . LUMBAR  LAMINECTOMY/DECOMPRESSION MICRODISCECTOMY Right 03/21/2013   Procedure: Right Lumbar five-Sacral one Laminectomy for Synovial Cyst;  Surgeon: Faythe Ghee, MD;  Location: [facility] NEURO ORS;  Service: Neurosurgery;  Laterality: Right;  right  . OOPHORECTOMY    . REVERSE SHOULDER ARTHROPLASTY Right 02/22/2018   Procedure: REVERSE SHOULDER ARTHROPLASTY;  Surgeon: Corky Mull, MD;  Location: [facility] ORS;  Service: Orthopedics;  Laterality: Right;  . TUBAL LIGATION    . UNILATERAL SALPINGECTOMY Left 04/09/2016   Procedure: UNILATERAL SALPINGECTOMY;  Surgeon: Gae Dry, MD;  Location: [facility] ORS;  Service: Gynecology;  Laterality: Left;  . UPPER GI ENDOSCOPY  2010   with biopsy of gastric erosion  . VAGINAL HYSTERECTOMY N/A 04/09/2016   Procedure: HYSTERECTOMY VAGINAL;  Surgeon: Gae Dry, MD;  Location: [facility] ORS;  Service: Gynecology;  Laterality: N/A;  . VAGINAL HYSTERECTOMY  2017   and bladder tack    Medical History: Past Medical History:  Diagnosis Date  . Anxiety   . Arthritis    osteo  . Asthma    needs rescue inhaler few times a year  . Depression   . Dizziness    light headed spells but it has been a while  . Dysrhythmia    tachycardia.(cardizem). brady during procedures  . GERD (gastroesophageal reflux disease)    gastritis  . Headache(784.0)   . Heart murmur 2019   aortic. dr. Nehemiah Massed not worried about this  . Hyperlipidemia   . Hypertension   . Hypothyroidism   . Migraine   . Orthopnea   . Pneumonia    long time ago (greater than 5 years ago)  . PONV (postoperative nausea and vomiting)    very anxious during cataract surgery. brady during colonoscopy  . Shortness of breath    any exertion, cannot lie flat    Family History: Family History  Problem Relation Age of Onset  . Pulmonary fibrosis Mother   . Melanoma Father   . Cardiomyopathy Sister        and arrhythmia      Review of Systems  Constitutional: Positive for fatigue. Negative for activity  change, appetite change, chills and unexpected weight change.  HENT: Negative for congestion, postnasal drip, rhinorrhea, sneezing and sore throat.        Left ear gets sharp pain  when she blows her nose.   Eyes: Negative.  Negative for redness.  Respiratory: Negative for cough, chest tightness, shortness of breath and wheezing.   Cardiovascular: Negative for chest pain and palpitations.  Gastrointestinal: Positive for abdominal pain, diarrhea and nausea. Negative for constipation and vomiting.       Sees GI on regular basis. Next appointment is on 04/21/2018  Endocrine: Negative for cold intolerance, heat intolerance, polydipsia, polyphagia and polyuria.       Sees endocrinology   Genitourinary: Negative.  Negative for dysuria, frequency and urgency.  Musculoskeletal: Positive for arthralgias, back pain and myalgias. Negative for joint swelling and neck pain.       Fel from her stool in her home and hit the hutch in her home. Has bruising and swelling of her right hip. Hurts to put weight on It and walking hurts.  Had right shoulder replacement 6 weeks ago. Has appointment with physical therapy this afternoon.   Skin: Negative for rash.  Allergic/Immunologic: Negative for environmental allergies.  Neurological: Positive for weakness and numbness. Negative for tremors.       Mild cognitive delay and difficulty concentrating at times.   Hematological: Negative for adenopathy. Does not bruise/bleed easily.  Psychiatric/Behavioral: Positive for dysphoric mood. Negative for behavioral problems (Depression), sleep disturbance and suicidal ideas. The patient is nervous/anxious.     Today's Vitals   04/18/18 1035  BP: (!) 165/74  Pulse: 78  Resp: 16  SpO2: 95%  Weight: 131 lb 9.6 oz (59.7 kg)  Height: '5\' 1"'$  (1.549 m)   Physical Exam  Constitutional: She is oriented to person, place, and time. She appears well-developed and well-nourished. No distress.  HENT:  Head: Normocephalic and  atraumatic.  Right Ear: Tympanic membrane is bulging. Tympanic membrane is not erythematous.  Left Ear: There is tenderness. Tympanic membrane is bulging. Tympanic membrane is not erythematous.  Nose: Nose normal. No rhinorrhea. Right sinus exhibits no maxillary sinus tenderness and no frontal sinus tenderness. Left sinus exhibits no maxillary sinus tenderness and no frontal sinus tenderness.  Mouth/Throat: No oropharyngeal exudate or posterior oropharyngeal erythema.  Eyes: Pupils are equal, round, and reactive to light. Conjunctivae and EOM are normal.  Neck: Normal range of motion. Neck supple. No JVD present. Carotid bruit is not present. No tracheal deviation present. No thyromegaly present.  Cardiovascular: Normal rate, regular rhythm and intact distal pulses. Exam reveals no gallop and no friction rub.  Murmur heard.  Systolic murmur is present with a grade of 2/6. Pulmonary/Chest: Effort normal and breath sounds normal. No respiratory distress. She has no wheezes. She has no rales. She exhibits no tenderness. Right breast exhibits no inverted nipple, no mass, no nipple discharge, no skin change and no tenderness. Left breast exhibits no inverted nipple, no mass, no nipple discharge, no skin change and no tenderness.  Very mild wheezing heard throughout the lung fields.   Abdominal: Soft. Bowel sounds are normal. There is tenderness.  Musculoskeletal: Normal range of motion.       Legs: Mild tenderness of right shoulder. More severe with internal and external rotation of the right arm. No palpable abnormality or deformity noted at this time.   Lymphadenopathy:    She has no cervical adenopathy.  Neurological: She is alert and oriented to person, place, and time. No cranial nerve deficit.  Skin: Skin is warm and dry. Capillary refill takes less than 2 seconds. She is not diaphoretic.  Psychiatric: Her speech is normal and behavior is normal. Judgment  and thought content normal. Cognition  and memory are normal. She exhibits a depressed mood.  Nursing note and vitals reviewed.    LABS: Recent Results (from the past 2160 hour(s))  Urinalysis, Routine w reflex microscopic     Status: Abnormal   Collection Time: 02/11/18  2:13 PM  Result Value Ref Range   Color, Urine COLORLESS (A) YELLOW   APPearance CLEAR (A) CLEAR   Specific Gravity, Urine 1.003 (L) 1.005 - 1.030   pH 7.0 5.0 - 8.0   Glucose, UA NEGATIVE NEGATIVE mg/dL   Hgb urine dipstick NEGATIVE NEGATIVE   Bilirubin Urine NEGATIVE NEGATIVE   Ketones, ur NEGATIVE NEGATIVE mg/dL   Protein, ur NEGATIVE NEGATIVE mg/dL   Nitrite NEGATIVE NEGATIVE   Leukocytes, UA NEGATIVE NEGATIVE    Comment: Performed at [facility], [address], [place], [place] [address]  Urine culture     Status: Abnormal   Collection Time: 02/11/18  2:13 PM  Result Value Ref Range   Specimen Description      URINE, CLEAN CATCH Performed at [facility], [address], [place], [place] [address]    Special Requests      NONE Performed at [facility], [address], [place], [place] [address]    Culture (A)     <10,000 COLONIES/mL INSIGNIFICANT GROWTH Performed at [facility] [address], [place], [place] [address]    Report Status 02/12/2018 FINAL   Surgical pcr screen     Status: None   Collection Time: 02/11/18  2:13 PM  Result Value Ref Range   MRSA, PCR NEGATIVE NEGATIVE   Staphylococcus aureus NEGATIVE NEGATIVE    Comment: (NOTE) The Xpert SA Assay (FDA approved for NASAL specimens in patients 82 years of age and older), is one component of a comprehensive surveillance program. It is not intended to diagnose infection nor to guide or monitor treatment. Performed at [facility], [address] [place], [place] [address]   Type and screen [place]     Status: None   Collection Time: 02/11/18  2:13 PM  Result Value Ref Range   ABO/RH(D) A POS      Antibody Screen NEG    Sample Expiration 02/25/2018    Extend sample reason      NO TRANSFUSIONS OR PREGNANCY IN THE PAST 3 MONTHS Performed at [facility], [address] [place], [place] [address]   CBC with Differential/Platelet     Status: Abnormal   Collection Time: 02/11/18  2:30 PM  Result Value Ref Range   WBC 8.2 3.6 - 11.0 K/uL   RBC 4.74 3.80 - 5.20 MIL/uL   Hemoglobin 13.0 12.0 - 16.0 g/dL   HCT 40.1 35.0 - 47.0 %   MCV 84.6 80.0 - 100.0 fL   MCH 27.5 26.0 - 34.0 pg   MCHC 32.5 32.0 - 36.0 g/dL   RDW 14.9 (H) 11.5 - 14.5 %   Platelets 379 150 - 440 K/uL   Neutrophils Relative % 70 %   Neutro Abs 5.7 1.4 - 6.5 K/uL   Lymphocytes Relative 17 %   Lymphs Abs 1.4 1.0 - 3.6 K/uL   Monocytes Relative 11 %   Monocytes Absolute 0.9 0.2 - 0.9 K/uL   Eosinophils Relative 2 %   Eosinophils Absolute 0.1 0 - 0.7 K/uL   Basophils Relative 0 %   Basophils Absolute 0.0 0 - 0.1 K/uL    Comment: Performed at [facility], [address] [place], [place] [address]  Comprehensive metabolic panel     Status: None   Collection Time: 02/11/18  2:30 PM  Result Value Ref Range   Sodium 138 135 - 145 mmol/L   Potassium 3.8 3.5 - 5.1 mmol/L   Chloride 99 98 - 111 mmol/L    Comment: Please note change in reference range.   CO2 27 22 - 32 mmol/L   Glucose, Bld 97 70 - 99 mg/dL    Comment: Please note change in reference range.   BUN 21 8 - 23 mg/dL    Comment: Please note change in reference range.   Creatinine, Ser 0.82 0.44 - 1.00 mg/dL   Calcium 9.7 8.9 - 10.3 mg/dL   Total Protein 7.7 6.5 - 8.1 g/dL   Albumin 4.8 3.5 - 5.0 g/dL   AST 27 15 - 41 U/L   ALT 25 0 - 44 U/L    Comment: Please note change in reference range.   Alkaline Phosphatase 56 38 - 126 U/L   Total Bilirubin 0.4 0.3 - 1.2 mg/dL   GFR calc non Af Amer >60 >60 mL/min   GFR calc Af Amer >60 >60 mL/min    Comment: (NOTE) The eGFR has been calculated using the CKD EPI equation. This  calculation has not been validated in all clinical situations. eGFR's persistently <60 mL/min signify possible Chronic Kidney Disease.    Anion gap 12 5 - 15    Comment: Performed at [facility], [address] [place], [place] [address]  Protime-INR     Status: None   Collection Time: 02/11/18  2:30 PM  Result Value Ref Range   Prothrombin Time 12.3 11.4 - 15.2 seconds   INR 0.92     Comment: Performed at [facility], [address], [place], [place] [address]  Surgical pathology     Status: None   Collection Time: 02/22/18 11:27 AM  Result Value Ref Range   SURGICAL PATHOLOGY      Surgical Pathology CASE: [id number] PATIENT: Casilda Carls Covault Surgical Pathology Report     SPECIMEN SUBMITTED: A. Lipoma shoulder, right B. Humeral  head, right  CLINICAL HISTORY: None provided  PRE-OPERATIVE DIAGNOSIS: Non traumatic tear of right rotator cuff, cuff tendinitis  POST-OPERATIVE DIAGNOSIS: Right rotator cuff tear     DIAGNOSIS: A. SOFT TISSUE MASS, RIGHT SHOULDER; REMOVAL: - LIPOMA.  Comment: The lipoma is adjacent to skeletal muscle but I do not see definite intramuscular involvement. Clinical correlation is recommended.  B. HUMERAL HEAD, RIGHT; REVERSE SHOULDER ARTHROPLASTY: - MILD OSTEOARTHRITIS. - NORMOCELLULAR BONE MARROW WITH NORMAL-APPEARING TRILINEAGE HEMATOPOIESIS.   GROSS DESCRIPTION: A. Labeled: Lipoma right shoulder Received: In formalin Tissue fragment(s): 1 Size: 4.1 x 3.6 x 3.0 cm Description: Roughly ovoid fragment of yellow lobulated fibrofatty tissue with focal attached muscle, inked blue and sectioned, soft  yellow fibrofatty Representative submitted in two cassettes.  B. Labeled: Right humeral head Received: In formalin Size of specimen:     - 4.2 x 3.8 x 1.6 cm Articular surface: Red-tan with focal granularity Cut surface: Yellow-tan Other findings: None noted  Block summary: 1 - representative  section  Tissue decalcification: Yes   Final Diagnosis performed by Bryan Lemma, MD.   Electronically signed 02/25/2018 2:11:23PM The electronic signature indicates that the named Attending Pathologist has evaluated the specimen  Technical component performed at [facility], [address], [place], [place] [address] Lab: [phone] Dir: Rush Farmer, MD, MMM  Professional component performed at  [facility], [facility], [address], [place], [place] [address] Lab: [phone] Dir: Dellia Nims. Rubinas, MD   CBC with Differential/Platelet     Status: Abnormal   Collection Time: 02/23/18  3:25 AM  Result Value Ref Range   WBC 10.5 3.6 - 11.0 K/uL   RBC 4.09 3.80 - 5.20 MIL/uL   Hemoglobin 11.6 (L) 12.0 - 16.0 g/dL   HCT 34.6 (L) 35.0 - 47.0 %   MCV 84.5 80.0 - 100.0 fL   MCH 28.3 26.0 - 34.0 pg   MCHC 33.5 32.0 - 36.0 g/dL   RDW 15.0 (H) 11.5 - 14.5 %   Platelets 249 150 - 440 K/uL   Neutrophils Relative % 84 %   Neutro Abs 8.9 (H) 1.4 - 6.5 K/uL   Lymphocytes Relative 6 %   Lymphs Abs 0.6 (L) 1.0 - 3.6 K/uL   Monocytes Relative 10 %   Monocytes Absolute 1.0 (H) 0.2 - 0.9 K/uL   Eosinophils Relative 0 %   Eosinophils Absolute 0.0 0 - 0.7 K/uL   Basophils Relative 0 %   Basophils Absolute 0.0 0 - 0.1 K/uL    Comment: Performed at [facility], [address] [place] [address] [place], [place] [address]  Basic metabolic panel     Status: Abnormal   Collection Time: 02/23/18  3:25 AM  Result Value Ref Range   Sodium 140 135 - 145 mmol/L   Potassium 2.8 (L) 3.5 - 5.1 mmol/L   Chloride 107 98 - 111 mmol/L    Comment: Please note change in reference range.   CO2 25 22 - 32 mmol/L   Glucose, Bld 117 (H) 70 - 99 mg/dL    Comment: Please note change in reference range.   BUN 7 (L) 8 - 23 mg/dL    Comment: Please note change in reference range.   Creatinine, Ser 0.53 0.44 - 1.00 mg/dL   Calcium 8.6 (L) 8.9 - 10.3 mg/dL   GFR calc non Af Amer >60 >60 mL/min   GFR calc Af  Amer >60 >60 mL/min    Comment: (NOTE) The eGFR has been calculated using the CKD EPI equation. This calculation has not been validated in all clinical situations. eGFR's persistently <60 mL/min signify possible Chronic Kidney Disease.    Anion gap 8 5 - 15    Comment: Performed at [facility], [address] [place], [place] [address]  Potassium     Status: None   Collection Time: 02/23/18  2:07 PM  Result Value Ref Range   Potassium 3.7 3.5 - 5.1 mmol/L    Comment: Performed at [facility], [address] [place], [place] [address]  Basic metabolic panel     Status: Abnormal   Collection Time: 02/24/18  3:09 AM  Result Value Ref Range   Sodium 136 135 - 145 mmol/L   Potassium 4.0 3.5 - 5.1 mmol/L   Chloride 105 98 - 111 mmol/L    Comment: Please note change in reference range.   CO2 26 22 - 32 mmol/L   Glucose, Bld 137 (H) 70 - 99 mg/dL    Comment: Please note change in reference range.   BUN 8 8 - 23 mg/dL    Comment: Please note change in reference range.   Creatinine, Ser 0.62 0.44 - 1.00 mg/dL   Calcium 8.6 (L) 8.9 - 10.3 mg/dL   GFR calc non Af Amer >60 >60 mL/min   GFR calc Af Amer >60 >60 mL/min  Comment: (NOTE) The eGFR has been calculated using the CKD EPI equation. This calculation has not been validated in all clinical situations. eGFR's persistently <60 mL/min signify possible Chronic Kidney Disease.    Anion gap 5 5 - 15    Comment: Performed at [facility], [address] [place], [place] [address]  UA/M w/rflx Culture, Routine     Status: Abnormal   Collection Time: 04/18/18 10:43 AM  Result Value Ref Range   Specific Gravity, UA 1.005 1.005 - 1.030   pH, UA 8.0 (H) 5.0 - 7.5   Color, UA Yellow Yellow   Appearance Ur Clear Clear   Leukocytes, UA Negative Negative   Protein, UA Negative Negative/Trace   Glucose, UA Negative Negative   Ketones, UA Negative Negative   RBC, UA Negative Negative   Bilirubin, UA  Negative Negative   Urobilinogen, Ur 0.2 0.2 - 1.0 mg/dL   Nitrite, UA Negative Negative   Microscopic Examination Comment     Comment: Microscopic follows if indicated.   Microscopic Examination See below:     Comment: Microscopic was indicated and was performed.   Urinalysis Reflex Comment     Comment: This specimen will not reflex to a Urine Culture.  Microscopic Examination     Status: None   Collection Time: 04/18/18 10:43 AM  Result Value Ref Range   WBC, UA None seen 0 - 5 /hpf   RBC, UA 0-2 0 - 2 /hpf   Epithelial Cells (non renal) None seen 0 - 10 /hpf   Casts None seen None seen /lpf   Mucus, UA Present Not Estab.   Bacteria, UA Few None seen/Few  Comprehensive metabolic panel     Status: Abnormal   Collection Time: 04/26/18  8:49 AM  Result Value Ref Range   Glucose 99 65 - 99 mg/dL   BUN 16 8 - 27 mg/dL   Creatinine, Ser 0.57 0.57 - 1.00 mg/dL   GFR calc non Af Amer 92 >59 mL/min/1.73   GFR calc Af Amer 106 >59 mL/min/1.73   BUN/Creatinine Ratio 28 12 - 28   Sodium 138 134 - 144 mmol/L   Potassium 4.0 3.5 - 5.2 mmol/L   Chloride 102 96 - 106 mmol/L   CO2 20 20 - 29 mmol/L   Calcium 9.4 8.7 - 10.3 mg/dL   Total Protein 6.3 6.0 - 8.5 g/dL   Albumin 4.4 3.5 - 4.8 g/dL   Globulin, Total 1.9 1.5 - 4.5 g/dL   Albumin/Globulin Ratio 2.3 (H) 1.2 - 2.2   Bilirubin Total 0.2 0.0 - 1.2 mg/dL   Alkaline Phosphatase 64 39 - 117 IU/L   AST 21 0 - 40 IU/L   ALT 17 0 - 32 IU/L  CBC     Status: None   Collection Time: 04/26/18  8:49 AM  Result Value Ref Range   WBC 3.7 3.4 - 10.8 x10E3/uL   RBC 4.18 3.77 - 5.28 x10E6/uL   Hemoglobin 11.5 11.1 - 15.9 g/dL   Hematocrit 34.6 34.0 - 46.6 %   MCV 83 79 - 97 fL   MCH 27.5 26.6 - 33.0 pg   MCHC 33.2 31.5 - 35.7 g/dL   RDW 13.7 12.3 - 15.4 %   Platelets 337 150 - 450 x10E3/uL  Lipid Panel w/o Chol/HDL Ratio     Status: None   Collection Time: 04/26/18  8:49 AM  Result Value Ref Range   Cholesterol, Total 146 100 - 199 mg/dL    Triglycerides  80 0 - 149 mg/dL   HDL 58 >39 mg/dL   VLDL Cholesterol Cal 16 5 - 40 mg/dL   LDL Calculated 72 0 - 99 mg/dL  Iron and TIBC     Status: Abnormal   Collection Time: 04/26/18  8:49 AM  Result Value Ref Range   Total Iron Binding Capacity 501 (H) 250 - 450 ug/dL   UIBC 459 (H) 118 - 369 ug/dL   Iron 42 27 - 139 ug/dL   Iron Saturation 8 (LL) 15 - 55 %  B12 and Folate Panel     Status: None   Collection Time: 04/26/18  8:49 AM  Result Value Ref Range   Vitamin B-12 925 232 - 1,245 pg/mL   Folate >20.0 >3.0 ng/mL    Comment: A serum folate concentration of less than 3.1 ng/mL is considered to represent clinical deficiency.   VITAMIN D 25 Hydroxy (Vit-D Deficiency, Fractures)     Status: None   Collection Time: 04/26/18  8:49 AM  Result Value Ref Range   Vit D, 25-Hydroxy 59.7 30.0 - 100.0 ng/mL    Comment: Vitamin D deficiency has been defined by the [place] practice guideline as a level of serum 25-OH vitamin D less than 20 ng/mL (1,2). The Endocrine Society went on to further define vitamin D insufficiency as a level between 21 and 29 ng/mL (2). 1. IOM (Institute of Medicine). 2010. Dietary reference    intakes for calcium and D. [place]: The    [employer]. 2. Holick MF, Binkley [place], Bischoff-Ferrari HA, et al.    Evaluation, treatment, and prevention of vitamin D    deficiency: an Endocrine Society clinical practice    guideline. JCEM. 2011 Jul; 96(7):1911-30.   Ferritin     Status: Abnormal   Collection Time: 04/26/18  8:49 AM  Result Value Ref Range   Ferritin 13 (L) 15 - 150 ng/mL   Depression screen [facility] 2/9 04/18/2018 01/24/2018 09/20/2017 09/20/2017  Decreased Interest 0 0 0 0  Down, Depressed, Hopeless 0 0 0 0  PHQ - 2 Score 0 0 0 0    Functional Status Survey: Is the patient deaf or have difficulty hearing?: Yes Does the patient have difficulty seeing, even when wearing glasses/contacts?:  Yes Does the patient have difficulty concentrating, remembering, or making decisions?: Yes Does the patient have difficulty walking or climbing stairs?: Yes(minor pain due to arthritis in joints) Does the patient have difficulty dressing or bathing?: No Does the patient have difficulty doing errands alone such as visiting a doctor's office or shopping?: No  MMSE - [place] Exam 04/18/2018  Orientation to time 5  Orientation to Place 5  Registration 3  Attention/ Calculation 5  Recall 3  Language- name 2 objects 2  Language- repeat 1  Language- follow 3 step command 3  Language- read & follow direction 1  Write a sentence 1  Copy design 1  Total score 30    Fall Risk  04/18/2018 01/24/2018 09/20/2017 09/20/2017 04/14/2016  Falls in the past year? Yes No No No No  Comment right side a little pain still there - - - Emmi Telephone Survey: data to providers prior to load  Number falls in past yr: 1 - - - -  Injury with Fall? No - - - -        Assessment/Plan: 1. Encounter for general adult medical examination with abnormal findings Annual wellness visit today - Comprehensive  metabolic panel  2. Primary osteoarthritis involving multiple joints Add diclofenac gel which may be applied up to four times daily as needed for pain.  - diclofenac sodium (VOLTAREN) 1 % GEL; Apply 4 g topically 4 (four) times daily as needed (pain).  Dispense: 5 Tube; Refill: 3  3. Mild intermittent asthma without complication Continue inhalers and respiratory medication as needed and as prescribed.  - albuterol (VENTOLIN HFA) 108 (90 Base) MCG/ACT inhaler; Inhale 2 puffs into the lungs every 4 (four) hours as needed for wheezing or shortness of breath.  Dispense: 18 g; Refill: 0 - CBC with Differential/Platelet  4. Right hip pain Will x-ray the right hip for further revaluation. Refer to orthopedics as indicated. - DG HIP UNILAT WITH PELVIS 2-3 VIEWS RIGHT; Future  5. Fall from furniture, initial  encounter Will x-ray the right hip for further revaluation. Refer to orthopedics as indicated. - DG HIP UNILAT WITH PELVIS 2-3 VIEWS RIGHT; Future  6. Cough - benzonatate (TESSALON) 100 MG capsule; Take 2 capsules (200 mg total) by mouth 2 (two) times daily as needed for cough.  Dispense: 60 capsule; Refill: 3  7. Episode of recurrent major depressive disorder, unspecified depression episode severity ([place]) - citalopram (CELEXA) 10 MG tablet; Take 1 tablet (10 mg total) by mouth at bedtime.  Dispense: 30 tablet; Refill: 5  8. Mixed hyperlipidemia - CBC with Differential/Platelet - Comprehensive metabolic panel - Lipid panel  9. Other fatigue labwrok ordered today.   10. Vitamin D deficiency - Vitamin D 1,25 dihydroxy  11. Dysuria - UA/M w/rflx Culture, Routine  General Counseling: Jakeline verbalizes understanding of the findings of todays visit and agrees with plan of treatment. I have discussed any further diagnostic evaluation that may be needed or ordered today. We also reviewed her medications today. she has been encouraged to call the office with any questions or concerns that should arise related to todays visit.    Counseling:  This patient was seen by Leretha Pol FNP Collaboration with Dr Lavera Guise as a part of collaborative care agreement  Orders Placed This Encounter  Procedures  . Microscopic Examination  . DG HIP UNILAT WITH PELVIS 2-3 VIEWS RIGHT  . UA/M w/rflx Culture, Routine  . CBC with Differential/Platelet  . Comprehensive metabolic panel  . Lipid panel  . Vitamin D 1,25 dihydroxy    Meds ordered this encounter  Medications  . citalopram (CELEXA) 10 MG tablet    Sig: Take 1 tablet (10 mg total) by mouth at bedtime.    Dispense:  30 tablet    Refill:  5    Order Specific Question:   Supervising Provider    Answer:   Lavera Guise [[id number]]  . diclofenac sodium (VOLTAREN) 1 % GEL    Sig: Apply 4 g topically 4 (four) times daily as needed (pain).     Dispense:  5 Tube    Refill:  3    Patient generally gets 5 boxes at a time.    Order Specific Question:   Supervising Provider    Answer:   Lavera Guise [[id number]]  . benzonatate (TESSALON) 100 MG capsule    Sig: Take 2 capsules (200 mg total) by mouth 2 (two) times daily as needed for cough.    Dispense:  60 capsule    Refill:  3    Order Specific Question:   Supervising Provider    Answer:   Lavera Guise [[id number]]  . albuterol (VENTOLIN HFA) 108 (90  Base) MCG/ACT inhaler    Sig: Inhale 2 puffs into the lungs every 4 (four) hours as needed for wheezing or shortness of breath.    Dispense:  18 g    Refill:  0    Order Specific Question:   Supervising Provider    Answer:   Lavera Guise [[id number]]    Time spent: [place], MD  Internal Medicine

## 2018-04-19 LAB — UA/M W/RFLX CULTURE, ROUTINE
Bilirubin, UA: NEGATIVE
Glucose, UA: NEGATIVE
Ketones, UA: NEGATIVE
LEUKOCYTES UA: NEGATIVE
Nitrite, UA: NEGATIVE
PH UA: 8 — AB (ref 5.0–7.5)
Protein, UA: NEGATIVE
RBC, UA: NEGATIVE
Specific Gravity, UA: 1.005 (ref 1.005–1.030)
Urobilinogen, Ur: 0.2 mg/dL (ref 0.2–1.0)

## 2018-04-19 LAB — MICROSCOPIC EXAMINATION
CASTS: NONE SEEN /LPF
EPITHELIAL CELLS (NON RENAL): NONE SEEN /HPF (ref 0–10)
WBC, UA: NONE SEEN /hpf (ref 0–5)

## 2018-04-21 DIAGNOSIS — R1013 Epigastric pain: Secondary | ICD-10-CM | POA: Diagnosis not present

## 2018-04-22 ENCOUNTER — Telehealth: Payer: Self-pay

## 2018-04-22 DIAGNOSIS — Z96611 Presence of right artificial shoulder joint: Secondary | ICD-10-CM | POA: Diagnosis not present

## 2018-04-22 NOTE — Telephone Encounter (Signed)
Patient advised letter is ready for pickup (jury duty) tat

## 2018-04-22 NOTE — Telephone Encounter (Signed)
Pt walk in today for xrays result as per dr Humphrey Rolls advised pt that  Mild degenrative change with no abnormalities

## 2018-04-26 ENCOUNTER — Other Ambulatory Visit: Payer: Self-pay | Admitting: Nurse Practitioner

## 2018-04-26 DIAGNOSIS — E782 Mixed hyperlipidemia: Secondary | ICD-10-CM | POA: Diagnosis not present

## 2018-04-26 DIAGNOSIS — E559 Vitamin D deficiency, unspecified: Secondary | ICD-10-CM | POA: Diagnosis not present

## 2018-04-26 DIAGNOSIS — R5383 Other fatigue: Secondary | ICD-10-CM | POA: Diagnosis not present

## 2018-04-26 DIAGNOSIS — Z96611 Presence of right artificial shoulder joint: Secondary | ICD-10-CM | POA: Diagnosis not present

## 2018-04-26 DIAGNOSIS — Z0001 Encounter for general adult medical examination with abnormal findings: Secondary | ICD-10-CM | POA: Diagnosis not present

## 2018-04-27 DIAGNOSIS — M25551 Pain in right hip: Secondary | ICD-10-CM | POA: Insufficient documentation

## 2018-04-27 DIAGNOSIS — E559 Vitamin D deficiency, unspecified: Secondary | ICD-10-CM | POA: Insufficient documentation

## 2018-04-27 DIAGNOSIS — W08XXXA Fall from other furniture, initial encounter: Secondary | ICD-10-CM | POA: Insufficient documentation

## 2018-04-27 LAB — LIPID PANEL W/O CHOL/HDL RATIO
Cholesterol, Total: 146 mg/dL (ref 100–199)
HDL: 58 mg/dL (ref >39)
LDL CALC: 72 mg/dL (ref 0–99)
TRIGLYCERIDES: 80 mg/dL (ref 0–149)
VLDL Cholesterol Cal: 16 mg/dL (ref 5–40)

## 2018-04-27 LAB — COMPREHENSIVE METABOLIC PANEL
ALK PHOS: 64 IU/L (ref 39–117)
ALT: 17 IU/L (ref 0–32)
AST: 21 IU/L (ref 0–40)
Albumin/Globulin Ratio: 2.3 — ABNORMAL HIGH (ref 1.2–2.2)
Albumin: 4.4 g/dL (ref 3.5–4.8)
BILIRUBIN TOTAL: 0.2 mg/dL (ref 0.0–1.2)
BUN/Creatinine Ratio: 28 (ref 12–28)
BUN: 16 mg/dL (ref 8–27)
CHLORIDE: 102 mmol/L (ref 96–106)
CO2: 20 mmol/L (ref 20–29)
Calcium: 9.4 mg/dL (ref 8.7–10.3)
Creatinine, Ser: 0.57 mg/dL (ref 0.57–1.00)
GFR calc Af Amer: 106 mL/min/{1.73_m2} (ref >59)
GFR calc non Af Amer: 92 mL/min/{1.73_m2} (ref >59)
GLUCOSE: 99 mg/dL (ref 65–99)
Globulin, Total: 1.9 g/dL (ref 1.5–4.5)
Potassium: 4 mmol/L (ref 3.5–5.2)
Sodium: 138 mmol/L (ref 134–144)
TOTAL PROTEIN: 6.3 g/dL (ref 6.0–8.5)

## 2018-04-27 LAB — CBC
HEMATOCRIT: 34.6 % (ref 34.0–46.6)
Hemoglobin: 11.5 g/dL (ref 11.1–15.9)
MCH: 27.5 pg (ref 26.6–33.0)
MCHC: 33.2 g/dL (ref 31.5–35.7)
MCV: 83 fL (ref 79–97)
Platelets: 337 10*3/uL (ref 150–450)
RBC: 4.18 x10E6/uL (ref 3.77–5.28)
RDW: 13.7 % (ref 12.3–15.4)
WBC: 3.7 10*3/uL (ref 3.4–10.8)

## 2018-04-27 LAB — IRON AND TIBC
Iron Saturation: 8 % — CL (ref 15–55)
Iron: 42 ug/dL (ref 27–139)
TIBC: 501 ug/dL — AB (ref 250–450)
UIBC: 459 ug/dL — ABNORMAL HIGH (ref 118–369)

## 2018-04-27 LAB — FERRITIN: FERRITIN: 13 ng/mL — AB (ref 15–150)

## 2018-04-27 LAB — B12 AND FOLATE PANEL
Folate: >20 ng/mL (ref >3.0)
VITAMIN B 12: 925 pg/mL (ref 232–1245)

## 2018-04-27 LAB — VITAMIN D 25 HYDROXY (VIT D DEFICIENCY, FRACTURES): Vit D, 25-Hydroxy: 59.7 ng/mL (ref 30.0–100.0)

## 2018-04-28 NOTE — Progress Notes (Signed)
Can we refer her to hematology for iron infusions. Sshe has a lot of stomach issues and i'm not certain she would tolerate oral iron supplementation. Thanks.

## 2018-04-29 ENCOUNTER — Telehealth: Payer: Self-pay

## 2018-04-29 DIAGNOSIS — Z96611 Presence of right artificial shoulder joint: Secondary | ICD-10-CM | POA: Diagnosis not present

## 2018-04-29 NOTE — Telephone Encounter (Signed)
Pt advised for labs and also advised pt need referal foe hematology

## 2018-05-02 ENCOUNTER — Ambulatory Visit (INDEPENDENT_AMBULATORY_CARE_PROVIDER_SITE_OTHER): Payer: Medicare Other

## 2018-05-02 DIAGNOSIS — J301 Allergic rhinitis due to pollen: Secondary | ICD-10-CM

## 2018-05-02 DIAGNOSIS — E039 Hypothyroidism, unspecified: Secondary | ICD-10-CM | POA: Diagnosis not present

## 2018-05-02 DIAGNOSIS — R7303 Prediabetes: Secondary | ICD-10-CM | POA: Diagnosis not present

## 2018-05-03 DIAGNOSIS — Z96611 Presence of right artificial shoulder joint: Secondary | ICD-10-CM | POA: Diagnosis not present

## 2018-05-04 ENCOUNTER — Telehealth: Payer: Self-pay

## 2018-05-04 NOTE — Telephone Encounter (Signed)
Spoke with the patient to confirm appointment time and date 05/05/18 @ 10:15 AM. The patient was agreeable and understanding to come in for visit.

## 2018-05-05 ENCOUNTER — Inpatient Hospital Stay: Payer: Medicare Other | Attending: Oncology | Admitting: Oncology

## 2018-05-05 ENCOUNTER — Encounter: Payer: Self-pay | Admitting: Oncology

## 2018-05-05 ENCOUNTER — Inpatient Hospital Stay: Payer: Medicare Other

## 2018-05-05 ENCOUNTER — Other Ambulatory Visit: Payer: Self-pay

## 2018-05-05 VITALS — BP 148/73 | HR 79 | Temp 97.9°F | Resp 18 | Ht 61.0 in | Wt 130.3 lb

## 2018-05-05 DIAGNOSIS — R1013 Epigastric pain: Secondary | ICD-10-CM

## 2018-05-05 DIAGNOSIS — K2950 Unspecified chronic gastritis without bleeding: Secondary | ICD-10-CM | POA: Diagnosis not present

## 2018-05-05 DIAGNOSIS — D509 Iron deficiency anemia, unspecified: Secondary | ICD-10-CM | POA: Diagnosis not present

## 2018-05-05 LAB — URINALYSIS, COMPLETE (UACMP) WITH MICROSCOPIC
Bacteria, UA: NONE SEEN
Bilirubin Urine: NEGATIVE
GLUCOSE, UA: NEGATIVE mg/dL
HGB URINE DIPSTICK: NEGATIVE
Ketones, ur: NEGATIVE mg/dL
Leukocytes, UA: NEGATIVE
Nitrite: NEGATIVE
PH: 8 (ref 5.0–8.0)
Protein, ur: NEGATIVE mg/dL
Specific Gravity, Urine: 1.002 — ABNORMAL LOW (ref 1.005–1.030)
Squamous Epithelial / LPF: NONE SEEN (ref 0–5)

## 2018-05-06 ENCOUNTER — Encounter: Payer: Self-pay | Admitting: Oncology

## 2018-05-06 NOTE — Progress Notes (Signed)
Hematology/Oncology Consult note [facility] [phone] [phone]  Patient Care Team: Lavera Guise, MD as PCP - General (Internal Medicine)   Name of the patient: Vanessa Evans  [id number]  04/08/1946    Reason for referral- iron deficiency anemia   Referring physician- Dr. Clayborn Bigness  Date of visit: 05/06/18   History of presenting illness- Patient is a 72 year old female who is been referred to [place] for iron deficiency anemia.  Most recent labs from 04/26/2018 showed CBC of white count of 3.7, H&H of 11.5/34.6 and a platelet count of 337.  Iron study showed a low iron saturation of 8%, elevated TIBC of 501.  Ferritin levels were low at 13.  B12 and folate were within normal limits.  Patient has seen [name] in the past and underwent an endoscopy in January 2019 which showed gastric erosion and features of chronic gastritis.  Biopsy was negative for H. pylori.  Colonoscopy showed no evidence of active bleeding.  Patient has not had a small bowel capsule endoscopy done yet.  She reports feeling fatigued and has chronic joint pain.  Denies other complaints.  Her appetite is good and she denies any unintentional weight loss.  She has not taken oral iron yet.  She does have chronic epigastric pain on and off  ECOG PS- 1  Pain scale- 0   Review of systems- Review of Systems  Constitutional: Positive for malaise/fatigue. Negative for chills, fever and weight loss.  HENT: Negative for congestion, ear discharge and nosebleeds.   Eyes: Negative for blurred vision.  Respiratory: Negative for cough, hemoptysis, sputum production, shortness of breath and wheezing.   Cardiovascular: Negative for chest pain, palpitations, orthopnea and claudication.  Gastrointestinal: Negative for abdominal pain, blood in stool, constipation, diarrhea, heartburn, melena, nausea and vomiting.  Genitourinary: Negative for dysuria, flank pain, frequency, hematuria and  urgency.  Musculoskeletal: Positive for joint pain. Negative for back pain and myalgias.  Skin: Negative for rash.  Neurological: Negative for dizziness, tingling, focal weakness, seizures, weakness and headaches.  Endo/Heme/Allergies: Does not bruise/bleed easily.  Psychiatric/Behavioral: Negative for depression and suicidal ideas. The patient does not have insomnia.     Allergies  Allergen Reactions  . Codeine Shortness Of Breath and Itching    Bronchospasms and asthma attach  . Levofloxacin     Altered mental status": drunk" feeling, confused, speech problems Other reaction(s): Unknown   . Ultram [Tramadol] Itching and Other (See Comments)    Bronchospasm and asthma attack  . Nsaids Other (See Comments)    Bloody stools, abdominal pain History of gastritis  . Oxycontin [Oxycodone] Itching    Would take benadryl to eliminate itching. Has had bronchospasm  . Sulfacetamide Rash    Fever, abdominal pain  . Adhesive [Tape] Other (See Comments)    Thin skin. Causes tears. Paper tape okay  . Azithromycin     Unsure. Patient does not remember but thinks that it did not work for her.  . Ciprofloxacin Other (See Comments)    Severe pain in neck and down back   . Ketorolac Itching  . Nitrofurantoin Diarrhea    Tried again and had no problems with it  . Sulfa Antibiotics Itching, Rash and Other (See Comments)    fever  . Tolmetin     Other reaction(s): Other (See Comments) Bloody stools, abdominal pain History of chronic gastritis  . Zofran [Ondansetron Hcl] Other (See Comments)    constipation    Patient Active Problem List  Diagnosis Date Noted  . Iron deficiency anemia 05/05/2018  . Right hip pain 04/27/2018  . Accidental fall from furniture 04/27/2018  . Other fatigue 04/27/2018  . Vitamin D deficiency 04/27/2018  . Cough 04/18/2018  . Dysuria 04/18/2018  . Status post reverse total shoulder replacement, right 02/22/2018  . Pain in joint of right shoulder 02/13/2018   . Abdominal aortic atherosclerosis ([place]) 02/13/2018  . Episode of recurrent major depressive disorder ([place]) 02/13/2018  . Encounter for general adult medical examination with abnormal findings 02/13/2018  . Nausea 11/08/2017  . Acute gastritis 11/08/2017  . Allergic rhinitis 09/20/2017  . Eczema 09/20/2017  . Hematuria 09/20/2017  . Mixed hyperlipidemia 09/20/2017  . Osteoarthritis 09/20/2017  . Osteoporosis, post-menopausal 09/20/2017  . Palpitations 07/05/2017  . Epigastric pain 05/24/2017  . Gastroesophageal reflux disease without esophagitis 05/24/2017  . Impingement syndrome of left shoulder 10/02/2016  . Trochanteric bursitis of left hip 10/02/2016  . SOB (shortness of breath) 04/16/2016  . Uterine prolapse 04/09/2016  . Cystocele 04/09/2016  . Hyponatremia 04/09/2016  . Benign essential HTN 11/18/2015  . Paroxysmal supraventricular tachycardia ([place]) 11/18/2015  . Premature ventricular contraction 07/20/2014  . History of colonic polyps 05/29/2014  . Asthma 02/05/2014  . Chest pain 02/05/2014  . Increased frequency of urination 02/05/2014  . Tachycardia 02/05/2014  . Female stress incontinence 12/14/2013  . Gross hematuria 12/14/2013  . Incomplete emptying of bladder 12/14/2013  . Other chronic cystitis without hematuria 12/14/2013     Past Medical History:  Diagnosis Date  . Arthritis    osteo  . Asthma    needs rescue inhaler few times a year  . Depression   . Dizziness    light headed spells but it has been a while  . Dysrhythmia    tachycardia.(cardizem). brady during procedures  . GERD (gastroesophageal reflux disease)    gastritis  . Headache(784.0)   . Heart murmur 2019   aortic. dr. Nehemiah Massed not worried about this  . Hyperlipidemia   . Hypertension   . Hypothyroidism   . Migraine   . Orthopnea   . Pneumonia    long time ago (greater than 5 years ago)  . PONV (postoperative nausea and vomiting)    very anxious during cataract surgery. brady  during colonoscopy  . Shortness of breath    any exertion, cannot lie flat     Past Surgical History:  Procedure Laterality Date  . 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 BIOPSY THYROID  2018  . bladder biopsies  2003   9 biopsies done by dr. cope.  all benign.  inflammatory process going on in bladder  . BREAST BIOPSY Right    benign  . BREAST SURGERY Right    lumpectomy  . CATARACT EXTRACTION W/PHACO Left 12/25/2014   Procedure: CATARACT EXTRACTION PHACO AND INTRAOCULAR LENS PLACEMENT (IOC);  Surgeon: Birder Robson, MD;  Location: [facility] ORS;  Service: Ophthalmology;  Laterality: Left;  [place] 00:32 AP% 20.0 CDE 6.49  . COLONOSCOPY W/ BIOPSIES    . COLONOSCOPY W/ POLYPECTOMY  2002, 2004, 2005   adenomatous polyps removed  . COLONOSCOPY WITH PROPOFOL N/A 08/23/2017   Procedure: COLONOSCOPY WITH PROPOFOL;  Surgeon: Manya Silvas, MD;  Location: [facility] ENDOSCOPY;  Service: Endoscopy;  Laterality: N/A;  . COLONOSCOPY WITH PROPOFOL N/A 11/08/2017   Procedure: COLONOSCOPY WITH PROPOFOL;  Surgeon: Manya Silvas, MD;  Location: [facility] ENDOSCOPY;  Service:  Endoscopy;  Laterality: N/A;  . CYSTOCELE REPAIR N/A 04/09/2016   Procedure: ANTERIOR REPAIR (CYSTOCELE);  Surgeon: Gae Dry, MD;  Location: [facility] ORS;  Service: Gynecology;  Laterality: N/A;  . DIAGNOSTIC LAPAROSCOPY  2008   removed both ovaries with cysts, tubes and fibroids  . DILATION AND CURETTAGE OF UTERUS  1973  . ESOPHAGOGASTRODUODENOSCOPY (EGD) WITH PROPOFOL N/A 08/23/2017   Procedure: ESOPHAGOGASTRODUODENOSCOPY (EGD) WITH PROPOFOL;  Surgeon: Manya Silvas, MD;  Location: [facility] END[facility]COPY;  Service: Endoscopy;  Laterality: N/A;  . EYE SURGERY Bilateral 2016  . HERNIA REPAIR Right 2008   Inguinal hernia Repair, ventral hernia repair  . JOINT REPLACEMENT Right 2008   knee  . KNEE ARTHROSCOPY Right   . LUMBAR LAMINECTOMY/DECOMPRESSION MICRODISCECTOMY Right 03/21/2013    Procedure: Right Lumbar five-Sacral one Laminectomy for Synovial Cyst;  Surgeon: Faythe Ghee, MD;  Location: [facility] NEURO ORS;  Service: Neurosurgery;  Laterality: Right;  right  . OOPHORECTOMY    . REVERSE SHOULDER ARTHROPLASTY Right 02/22/2018   Procedure: REVERSE SHOULDER ARTHROPLASTY;  Surgeon: Corky Mull, MD;  Location: [facility] ORS;  Service: Orthopedics;  Laterality: Right;  . TUBAL LIGATION    . UNILATERAL SALPINGECTOMY Left 04/09/2016   Procedure: UNILATERAL SALPINGECTOMY;  Surgeon: Gae Dry, MD;  Location: [facility] ORS;  Service: Gynecology;  Laterality: Left;  . UPPER GI ENDOSCOPY  2010   with biopsy of gastric erosion  . VAGINAL HYSTERECTOMY N/A 04/09/2016   Procedure: HYSTERECTOMY VAGINAL;  Surgeon: Gae Dry, MD;  Location: [facility] ORS;  Service: Gynecology;  Laterality: N/A;  . VAGINAL HYSTERECTOMY  2017   and bladder tack    Social History   Socioeconomic History  . Marital status: Married    Spouse name: Not on file  . Number of children: Not on file  . Years of education: Not on file  . Highest education level: Not on file  Occupational History  . Not on file  Social Needs  . Financial resource strain: Not on file  . Food insecurity:    Worry: Not on file    Inability: Not on file  . Transportation needs:    Medical: Not on file    Non-medical: Not on file  Tobacco Use  . Smoking status: Former Smoker    Types: Cigarettes  . Smokeless tobacco: Never Used  Substance and Sexual Activity  . Alcohol use: Yes    Comment: occasional wine  . Drug use: No  . Sexual activity: Not Currently  Lifestyle  . Physical activity:    Days per week: Not on file    Minutes per session: Not on file  . Stress: Not on file  Relationships  . Social connections:    Talks on phone: Not on file    Gets together: Not on file    Attends religious service: Not on file    Active member of club or organization: Not on file    Attends meetings of clubs or organizations: Not  on file    Relationship status: Not on file  . Intimate partner violence:    Fear of current or ex partner: Not on file    Emotionally abused: Not on file    Physically abused: Not on file    Forced sexual activity: Not on file  Other Topics Concern  . Not on file  Social History Narrative  . Not on file     Family History  Problem Relation Age of Onset  . Pulmonary fibrosis Mother   .  Melanoma Father   . Cardiomyopathy Sister        and arrhythmia     Current Outpatient Medications:  .  aspirin EC 81 MG tablet, Take 81 mg by mouth at bedtime. , Disp: , Rfl:  .  Calcium Carbonate-Vitamin D (CALCIUM 600+D) 600-400 MG-UNIT tablet, Take 1 tablet by mouth 2 (two) times daily., Disp: , Rfl:  .  cholecalciferol (VITAMIN D) 1000 UNITS tablet, Take 1,000 Units by mouth at bedtime. , Disp: , Rfl:  .  citalopram (CELEXA) 10 MG tablet, Take 1 tablet (10 mg total) by mouth at bedtime., Disp: 30 tablet, Rfl: 5 .  cyanocobalamin 500 MCG tablet, Take 500 mcg by mouth daily., Disp: , Rfl:  .  diltiazem (CARDIZEM CD) 120 MG 24 hr capsule, Take 120 mg by mouth at bedtime. , Disp: , Rfl: 3 .  docusate sodium (COLACE) 100 MG capsule, Take 200 mg by mouth at bedtime as needed for moderate constipation. , Disp: , Rfl:  .  EPIPEN 2-PAK 0.3 MG/0.3ML SOAJ injection, use as directed for severe allergy reaction, Disp: 2 Device, Rfl: 0 .  fenofibrate 160 MG tablet, take1 tab po daily for chol (Patient taking differently: Take 160 mg by mouth every evening. ), Disp: 30 tablet, Rfl: 5 .  folic acid (FOLVITE) 662 MCG tablet, Take 400 mcg by mouth daily., Disp: , Rfl:  .  levothyroxine (SYNTHROID, LEVOTHROID) 50 MCG tablet, Take 50 mcg by mouth daily before breakfast. Brand Name only, Disp: , Rfl:  .  Magnesium 500 MG TABS, Take 100 mg by mouth at bedtime. , Disp: , Rfl:  .  Melatonin 5 MG TABS, Take 1 tablet by mouth at bedtime. For sleep , Disp: , Rfl:  .  methocarbamol (ROBAXIN) 500 MG tablet, Take 1 tablet  (500 mg total) by mouth every 6 (six) hours as needed for muscle spasms., Disp: 30 tablet, Rfl: 0 .  Multiple Vitamins-Minerals (MULTIVITAMIN WITH MINERALS) tablet, Take 1 tablet by mouth daily., Disp: , Rfl:  .  omeprazole (PRILOSEC) 40 MG capsule, Take 40 mg by mouth 2 (two) times daily. , Disp: , Rfl: 0 .  Phenylephrine-guaiFENesin 10-400 MG TABS, Take 1 tablet by mouth every 4 (four) hours as needed (congestion)., Disp: , Rfl:  .  pyridoxine (B-6) 100 MG tablet, Take 100 mg by mouth daily., Disp: , Rfl:  .  sodium chloride (OCEAN) 0.65 % SOLN nasal spray, Place 1 spray into both nostrils 2 (two) times daily., Disp: , Rfl:  .  sucralfate (CARAFATE) 1 g tablet, Take 1 g by mouth 3 (three) times daily between meals., Disp: , Rfl:  .  zinc gluconate 50 MG tablet, Take 50 mg by mouth daily., Disp: , Rfl:  .  acetaminophen (TYLENOL) 500 MG tablet, Take 1,000 mg by mouth 2 (two) times daily as needed for moderate pain., Disp: , Rfl:  .  acetaminophen (TYLENOL) 650 MG CR tablet, Take 1,300 mg by mouth at bedtime as needed for pain., Disp: , Rfl:  .  albuterol (PROVENTIL) (2.5 MG/3ML) 0.083% nebulizer solution, Take 3 mLs (2.5 mg total) by nebulization every 6 (six) hours as needed for wheezing or shortness of breath. (Patient not taking: Reported on 05/05/2018), Disp: 75 mL, Rfl: 12 .  albuterol (VENTOLIN HFA) 108 (90 Base) MCG/ACT inhaler, Inhale 2 puffs into the lungs every 4 (four) hours as needed for wheezing or shortness of breath. (Patient not taking: Reported on 05/05/2018), Disp: 18 g, Rfl: 0 .  amoxicillin-clavulanate (AUGMENTIN)  875-125 MG tablet, Take 1 tablet by mouth 2 (two) times daily. (Patient not taking: Reported on 04/18/2018), Disp: 20 tablet, Rfl: 0 .  benzonatate (TESSALON) 100 MG capsule, Take 2 capsules (200 mg total) by mouth 2 (two) times daily as needed for cough. (Patient not taking: Reported on 05/05/2018), Disp: 60 capsule, Rfl: 3 .  cyclobenzaprine (FLEXERIL) 10 MG tablet, Take 0.5  tablets (5 mg total) by mouth 2 (two) times daily as needed for muscle spasms. (Patient not taking: Reported on 04/18/2018), Disp: 45 tablet, Rfl: 2 .  diazepam (VALIUM) 5 MG tablet, Take 1 tablet po 1.5 hours pror to procedure. May repeat dose in 1 hour if needed. (Patient not taking: Reported on 05/05/2018), Disp: 2 tablet, Rfl: 0 .  diclofenac sodium (VOLTAREN) 1 % GEL, Apply 4 g topically 4 (four) times daily as needed (pain). (Patient not taking: Reported on 05/05/2018), Disp: 5 Tube, Rfl: 3 .  diphenhydrAMINE (BENADRYL) 25 MG tablet, Take 25-50 mg by mouth daily as needed for itching., Disp: , Rfl:  .  estradiol (ESTRACE VAGINAL) 0.1 MG/GM vaginal cream, Apply a pea-sized amount to urethra at bedtime (Patient not taking: Reported on 05/05/2018), Disp: 42.5 g, Rfl: 5 .  guaiFENesin (MUCINEX) 600 MG 12 hr tablet, Take 600 mg by mouth at bedtime as needed (congestion)., Disp: , Rfl:  .  HYDROcodone-acetaminophen (NORCO) 7.5-325 MG tablet, Take 1-2 tablets by mouth every 4 (four) hours as needed for severe pain (pain score 7-10). (Patient not taking: Reported on 04/18/2018), Disp: 60 tablet, Rfl: 0 .  Hypromellose (ARTIFICIAL TEARS OP), Place 1-2 drops into both eyes daily as needed (dry eyes)., Disp: , Rfl:  .  ketotifen (ZADITOR) 0.025 % ophthalmic solution, Place 1 drop into both eyes 2 (two) times daily as needed (allergies)., Disp: , Rfl:  .  levocetirizine (XYZAL) 5 MG tablet, Take 1 tablet (5 mg total) by mouth daily as needed. (Patient not taking: Reported on 05/05/2018), Disp: 30 tablet, Rfl: 5 .  pantoprazole (PROTONIX) 40 MG tablet, Take 40 mg by mouth 2 (two) times daily., Disp: , Rfl:  .  phenazopyridine (PYRIDIUM) 200 MG tablet, Take 1 tablet (200 mg total) by mouth 3 (three) times daily as needed for pain. (Patient not taking: Reported on 04/18/2018), Disp: 10 tablet, Rfl: 0 .  predniSONE (STERAPRED UNI-PAK 48 TAB) 10 MG (48) TBPK tablet, 12 day dose pack. Take by mouth as directed for 12 days  (Patient not taking: Reported on 01/24/2018), Disp: 48 tablet, Rfl: 0   Physical exam:  Vitals:   05/05/18 1040  BP: (!) 148/73  Pulse: 79  Resp: 18  Temp: 97.9 F (36.6 C)  TempSrc: Tympanic  SpO2: 97%  Weight: 130 lb 4.8 oz (59.1 kg)  Height: 5\' 1"  (1.549 m)   Physical Exam  Constitutional: She is oriented to person, place, and time. She appears well-developed and well-nourished.  HENT:  Head: Normocephalic and atraumatic.  Eyes: Pupils are equal, round, and reactive to light. EOM are normal.  Neck: Normal range of motion.  Cardiovascular: Normal rate, regular rhythm and normal heart sounds.  Pulmonary/Chest: Effort normal and breath sounds normal.  Abdominal: Soft. Bowel sounds are normal.  Neurological: She is alert and oriented to person, place, and time.  Skin: Skin is warm and dry.       CMP Latest Ref Rng & Units 04/26/2018  Glucose 65 - 99 mg/dL 99  BUN 8 - 27 mg/dL 16  Creatinine 0.57 - 1.00 mg/dL 0.57  Sodium 134 - 144 mmol/L 138  Potassium 3.5 - 5.2 mmol/L 4.0  Chloride 96 - 106 mmol/L 102  CO2 20 - 29 mmol/L 20  Calcium 8.7 - 10.3 mg/dL 9.4  Total Protein 6.0 - 8.5 g/dL 6.3  Total Bilirubin 0.0 - 1.2 mg/dL 0.2  Alkaline Phos 39 - 117 IU/L 64  AST 0 - 40 IU/L 21  ALT 0 - 32 IU/L 17   CBC Latest Ref Rng & Units 04/26/2018  WBC 3.4 - 10.8 x10E3/uL 3.7  Hemoglobin 11.1 - 15.9 g/dL 11.5  Hematocrit 34.0 - 46.6 % 34.6  Platelets 150 - 450 x10E3/uL 337    No images are attached to the encounter.  Dg Hip Unilat With Pelvis 2-3 Views Right  Result Date: 04/19/2018 CLINICAL DATA:  Fall 3 weeks ago with persistent right hip pain, initial encounter EXAM: DG HIP (WITH OR WITHOUT PELVIS) 3V RIGHT COMPARISON:  None. FINDINGS: Pelvic ring is intact. Mild degenerative changes of the pubic symphysis and lumbar spine are seen. Mild degenerative change of the hip joints is noted bilaterally. No acute fracture or dislocation is seen. No soft tissue changes are noted.  IMPRESSION: Mild degenerative change without acute abnormality. Electronically Signed   By: Inez Catalina M.D.   On: 04/19/2018 08:06    Assessment and plan- Patient is a 72 y.o. female referred for iron deficiency anemia  Patient has clear evidence of iron deficiency with mild anemia although etiology of her iron deficiency is unclear.  EGD did show chronic gastritis but no active bleeding.  Colonoscopy was unremarkable as well.  I would recommend that she should undergo small bowel capsule study to rule out any small bowel GI bleed to complete her anemia work-up.  Patient already has pre-existing epigastric pain because of her gastritis and is hesitant to try oral iron.  I will therefore proceed with 2 doses of Feraheme 510 mg weekly IV.  Discussed risks and benefits of Feraheme including all but not limited to headaches, leg swelling and possible risk of infusion reaction.  Patient understands and agrees to proceed as planned.  I will see him back in 2 months time with repeat CBC ferritin and iron studies as well as celiac disease to H pylori will not be checked as her biopsy for H. pylori was negative on EGD   Thank you for this kind referral and the opportunity to participate in the care of this patient   Visit Diagnosis 1. Iron deficiency anemia, unspecified iron deficiency anemia type     Dr. Randa Evens, MD, MPH [facility] [phone] 05/06/2018 1:02 PM

## 2018-05-09 ENCOUNTER — Encounter: Payer: Self-pay | Admitting: Internal Medicine

## 2018-05-09 ENCOUNTER — Ambulatory Visit (INDEPENDENT_AMBULATORY_CARE_PROVIDER_SITE_OTHER): Payer: Medicare Other | Admitting: Internal Medicine

## 2018-05-09 VITALS — BP 146/75 | HR 82 | Resp 16 | Ht 63.0 in | Wt 130.4 lb

## 2018-05-09 DIAGNOSIS — J301 Allergic rhinitis due to pollen: Secondary | ICD-10-CM | POA: Diagnosis not present

## 2018-05-09 DIAGNOSIS — R0602 Shortness of breath: Secondary | ICD-10-CM | POA: Diagnosis not present

## 2018-05-09 DIAGNOSIS — I6523 Occlusion and stenosis of bilateral carotid arteries: Secondary | ICD-10-CM

## 2018-05-09 DIAGNOSIS — J4520 Mild intermittent asthma, uncomplicated: Secondary | ICD-10-CM

## 2018-05-09 DIAGNOSIS — Z23 Encounter for immunization: Secondary | ICD-10-CM

## 2018-05-09 DIAGNOSIS — K219 Gastro-esophageal reflux disease without esophagitis: Secondary | ICD-10-CM

## 2018-05-09 NOTE — Patient Instructions (Signed)

## 2018-05-09 NOTE — Progress Notes (Signed)
[facility] [place], [place] [address]  Pulmonary Sleep Medicine   Office Visit Note  Patient Name: Vanessa Evans DOB: Feb 06, 1946 MRN [id number]  Date of Service: 05/09/2018  Complaints/HPI: she had her shoulder surgery. Doing well she is doing rehab. No exacerbations of her Asthma issues. No cough noted no congestion. No fevers or chills. No admissions to the hospital. She has been tolerating the meds well. She continues to use the ventolin as needed. Denies having chest pain no edema  ROS  General: (-) fever, (-) chills, (-) night sweats, (-) weakness Skin: (-) rashes, (-) itching,. Eyes: (-) visual changes, (-) redness, (-) itching. Nose and Sinuses: (-) nasal stuffiness or itchiness, (-) postnasal drip, (-) nosebleeds, (-) sinus trouble. Mouth and Throat: (-) sore throat, (-) hoarseness. Neck: (-) swollen glands, (-) enlarged thyroid, (-) neck pain. Respiratory: - cough, (-) bloody sputum, - shortness of breath, - wheezing. Cardiovascular: - ankle swelling, (-) chest pain. Lymphatic: (-) lymph node enlargement. Neurologic: (-) numbness, (-) tingling. Psychiatric: (-) anxiety, (-) depression   Current Medication: Outpatient Encounter Medications as of 05/09/2018  Medication Sig  . acetaminophen (TYLENOL) 500 MG tablet Take 1,000 mg by mouth 2 (two) times daily as needed for moderate pain.  Marland Kitchen acetaminophen (TYLENOL) 650 MG CR tablet Take 1,300 mg by mouth at bedtime as needed for pain.  Marland Kitchen albuterol (PROVENTIL) (2.5 MG/3ML) 0.083% nebulizer solution Take 3 mLs (2.5 mg total) by nebulization every 6 (six) hours as needed for wheezing or shortness of breath.  Marland Kitchen albuterol (VENTOLIN HFA) 108 (90 Base) MCG/ACT inhaler Inhale 2 puffs into the lungs every 4 (four) hours as needed for wheezing or shortness of breath.  . benzonatate (TESSALON) 100 MG capsule Take 2 capsules (200 mg total) by mouth 2 (two) times daily as needed for cough.  . Calcium  Carbonate-Vitamin D (CALCIUM 600+D) 600-400 MG-UNIT tablet Take 1 tablet by mouth 2 (two) times daily.  . cholecalciferol (VITAMIN D) 1000 UNITS tablet Take 1,000 Units by mouth at bedtime.   . citalopram (CELEXA) 10 MG tablet Take 1 tablet (10 mg total) by mouth at bedtime.  . cyanocobalamin 500 MCG tablet Take 500 mcg by mouth daily.  . diclofenac sodium (VOLTAREN) 1 % GEL Apply 4 g topically 4 (four) times daily as needed (pain).  Marland Kitchen diltiazem (CARDIZEM CD) 120 MG 24 hr capsule Take 120 mg by mouth at bedtime.   . diphenhydrAMINE (BENADRYL) 25 MG tablet Take 25-50 mg by mouth daily as needed for itching.  . docusate sodium (COLACE) 100 MG capsule Take 200 mg by mouth at bedtime as needed for moderate constipation.   Marland Kitchen EPIPEN 2-PAK 0.3 MG/0.3ML SOAJ injection use as directed for severe allergy reaction  . estradiol (ESTRACE VAGINAL) 0.1 MG/GM vaginal cream Apply a pea-sized amount to urethra at bedtime  . fenofibrate 160 MG tablet take1 tab po daily for chol (Patient taking differently: Take 160 mg by mouth every evening. )  . folic acid (FOLVITE) 672 MCG tablet Take 400 mcg by mouth daily.  Marland Kitchen guaiFENesin (MUCINEX) 600 MG 12 hr tablet Take 600 mg by mouth at bedtime as needed (congestion).  . Hypromellose (ARTIFICIAL TEARS OP) Place 1-2 drops into both eyes daily as needed (dry eyes).  Marland Kitchen ketotifen (ZADITOR) 0.025 % ophthalmic solution Place 1 drop into both eyes 2 (two) times daily as needed (allergies).  Marland Kitchen levocetirizine (XYZAL) 5 MG tablet Take 1 tablet (5 mg total) by mouth daily as needed.  Marland Kitchen  levothyroxine (SYNTHROID, LEVOTHROID) 50 MCG tablet Take 50 mcg by mouth daily before breakfast. Brand Name only  . Magnesium 500 MG TABS Take 100 mg by mouth at bedtime.   . Melatonin 5 MG TABS Take 1 tablet by mouth at bedtime. For sleep   . methocarbamol (ROBAXIN) 500 MG tablet Take 1 tablet (500 mg total) by mouth every 6 (six) hours as needed for muscle spasms.  . Multiple Vitamins-Minerals  (MULTIVITAMIN WITH MINERALS) tablet Take 1 tablet by mouth daily.  . pantoprazole (PROTONIX) 40 MG tablet Take 40 mg by mouth 2 (two) times daily.  Marland Kitchen Phenylephrine-guaiFENesin 10-400 MG TABS Take 1 tablet by mouth every 4 (four) hours as needed (congestion).  . pyridoxine (B-6) 100 MG tablet Take 100 mg by mouth daily.  . sodium chloride (OCEAN) 0.65 % SOLN nasal spray Place 1 spray into both nostrils 2 (two) times daily.  . sucralfate (CARAFATE) 1 g tablet Take 1 g by mouth 3 (three) times daily between meals.  . zinc gluconate 50 MG tablet Take 50 mg by mouth daily.  Marland Kitchen amoxicillin-clavulanate (AUGMENTIN) 875-125 MG tablet Take 1 tablet by mouth 2 (two) times daily. (Patient not taking: Reported on 04/18/2018)  . aspirin EC 81 MG tablet Take 81 mg by mouth at bedtime.   . cyclobenzaprine (FLEXERIL) 10 MG tablet Take 0.5 tablets (5 mg total) by mouth 2 (two) times daily as needed for muscle spasms. (Patient not taking: Reported on 04/18/2018)  . diazepam (VALIUM) 5 MG tablet Take 1 tablet po 1.5 hours pror to procedure. May repeat dose in 1 hour if needed. (Patient not taking: Reported on 05/05/2018)  . HYDROcodone-acetaminophen (NORCO) 7.5-325 MG tablet Take 1-2 tablets by mouth every 4 (four) hours as needed for severe pain (pain score 7-10). (Patient not taking: Reported on 04/18/2018)  . omeprazole (PRILOSEC) 40 MG capsule Take 40 mg by mouth 2 (two) times daily.   . phenazopyridine (PYRIDIUM) 200 MG tablet Take 1 tablet (200 mg total) by mouth 3 (three) times daily as needed for pain. (Patient not taking: Reported on 04/18/2018)  . predniSONE (STERAPRED UNI-PAK 48 TAB) 10 MG (48) TBPK tablet 12 day dose pack. Take by mouth as directed for 12 days (Patient not taking: Reported on 01/24/2018)   No facility-administered encounter medications on file as of 05/09/2018.     Surgical History: Past Surgical History:  Procedure Laterality Date  . 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 BIOPSY THYROID  2018  . bladder biopsies  2003   9 biopsies done by dr. cope.  all benign.  inflammatory process going on in bladder  . BREAST BIOPSY Right    benign  . BREAST SURGERY Right    lumpectomy  . CATARACT EXTRACTION W/PHACO Left 12/25/2014   Procedure: CATARACT EXTRACTION PHACO AND INTRAOCULAR LENS PLACEMENT (IOC);  Surgeon: Birder Robson, MD;  Location: [facility] ORS;  Service: Ophthalmology;  Laterality: Left;  [place] 00:32 AP% 20.0 CDE 6.49  . COLONOSCOPY W/ BIOPSIES    . COLONOSCOPY W/ POLYPECTOMY  2002, 2004, 2005   adenomatous polyps removed  . COLONOSCOPY WITH PROPOFOL N/A 08/23/2017   Procedure: COLONOSCOPY WITH PROPOFOL;  Surgeon: Manya Silvas, MD;  Location: [facility] ENDOSCOPY;  Service: Endoscopy;  Laterality: N/A;  . COLONOSCOPY WITH PROPOFOL N/A 11/08/2017   Procedure: COLONOSCOPY WITH PROPOFOL;  Surgeon: Manya Silvas, MD;  Location[facility]COPY;  Service: Endoscopy;  Laterality:  N/A;  . CYSTOCELE REPAIR N/A 04/09/2016   Procedure: ANTERIOR REPAIR (CYSTOCELE);  Surgeon: Gae Dry, MD;  Location: [facility] ORS;  Service: Gynecology;  Laterality: N/A;  . DIAGNOSTIC LAPAROSCOPY  2008   removed both ovaries with cysts, tubes and fibroids  . DILATION AND CURETTAGE OF UTERUS  1973  . ESOPHAGOGASTRODUODENOSCOPY (EGD) WITH PROPOFOL N/A 08/23/2017   Procedure: ESOPHAGOGASTRODUODENOSCOPY (EGD) WITH PROPOFOL;  Surgeon: Manya Silvas, MD;  Location: [facility] ENDOSCOPY;  Service: Endoscopy;  Laterality: N/A;  . EYE SURGERY Bilateral 2016  . HERNIA REPAIR Right 2008   Inguinal hernia Repair, ventral hernia repair  . JOINT REPLACEMENT Right 2008   knee  . KNEE ARTHROSCOPY Right   . LUMBAR LAMINECTOMY/DECOMPRESSION MICRODISCECTOMY Right 03/21/2013   Procedure: Right Lumbar five-Sacral one Laminectomy for Synovial Cyst;  Surgeon: Faythe Ghee, MD;  Location: [facility] NEURO ORS;  Service: Neurosurgery;  Laterality: Right;  right  . OOPHORECTOMY     . REVERSE SHOULDER ARTHROPLASTY Right 02/22/2018   Procedure: REVERSE SHOULDER ARTHROPLASTY;  Surgeon: Corky Mull, MD;  Location: [facility] ORS;  Service: Orthopedics;  Laterality: Right;  . TUBAL LIGATION    . UNILATERAL SALPINGECTOMY Left 04/09/2016   Procedure: UNILATERAL SALPINGECTOMY;  Surgeon: Gae Dry, MD;  Location: [facility] ORS;  Service: Gynecology;  Laterality: Left;  . UPPER GI ENDOSCOPY  2010   with biopsy of gastric erosion  . VAGINAL HYSTERECTOMY N/A 04/09/2016   Procedure: HYSTERECTOMY VAGINAL;  Surgeon: Gae Dry, MD;  Location: [facility] ORS;  Service: Gynecology;  Laterality: N/A;  . VAGINAL HYSTERECTOMY  2017   and bladder tack    Medical History: Past Medical History:  Diagnosis Date  . Arthritis    osteo  . Asthma    needs rescue inhaler few times a year  . Depression   . Dizziness    light headed spells but it has been a while  . Dysrhythmia    tachycardia.(cardizem). brady during procedures  . GERD (gastroesophageal reflux disease)    gastritis  . Headache(784.0)   . Heart murmur 2019   aortic. dr. Nehemiah Massed not worried about this  . Hyperlipidemia   . Hypertension   . Hypothyroidism   . Migraine   . Orthopnea   . Pneumonia    long time ago (greater than 5 years ago)  . PONV (postoperative nausea and vomiting)    very anxious during cataract surgery. brady during colonoscopy  . Shortness of breath    any exertion, cannot lie flat    Family History: Family History  Problem Relation Age of Onset  . Pulmonary fibrosis Mother   . Melanoma Father   . Cardiomyopathy Sister        and arrhythmia    Social History: Social History   Socioeconomic History  . Marital status: Married    Spouse name: Not on file  . Number of children: Not on file  . Years of education: Not on file  . Highest education level: Not on file  Occupational History  . Not on file  Social Needs  . Financial resource strain: Not on file  . Food insecurity:    Worry:  Not on file    Inability: Not on file  . Transportation needs:    Medical: Not on file    Non-medical: Not on file  Tobacco Use  . Smoking status: Former Smoker    Types: Cigarettes  . Smokeless tobacco: Never Used  Substance and Sexual Activity  . Alcohol use:  Not Currently    Comment: occasional wine  . Drug use: No  . Sexual activity: Not Currently  Lifestyle  . Physical activity:    Days per week: Not on file    Minutes per session: Not on file  . Stress: Not on file  Relationships  . Social connections:    Talks on phone: Not on file    Gets together: Not on file    Attends religious service: Not on file    Active member of club or organization: Not on file    Attends meetings of clubs or organizations: Not on file    Relationship status: Not on file  . Intimate partner violence:    Fear of current or ex partner: Not on file    Emotionally abused: Not on file    Physically abused: Not on file    Forced sexual activity: Not on file  Other Topics Concern  . Not on file  Social History Narrative  . Not on file    Vital Signs: Blood pressure (!) 146/75, pulse 82, resp. rate 16, height '5\' 3"'$  (1.6 m), weight 130 lb 6.4 oz (59.1 kg), SpO2 95 %.  Examination: General Appearance: The patient is well-developed, well-nourished, and in no distress. Skin: Gross inspection of skin unremarkable. Head: normocephalic, no gross deformities. Eyes: no gross deformities noted. ENT: ears appear grossly normal no exudates. Neck: Supple. No thyromegaly. No LAD. Respiratory: no rhonchi noted. Cardiovascular: Normal S1 and S2 without murmur or rub. Extremities: No cyanosis. pulses are equal. Neurologic: Alert and oriented. No involuntary movements.  LABS: Recent Results (from the past 2160 hour(s))  Urinalysis, Routine w reflex microscopic     Status: Abnormal   Collection Time: 02/11/18  2:13 PM  Result Value Ref Range   Color, Urine COLORLESS (A) YELLOW   APPearance CLEAR (A)  CLEAR   Specific Gravity, Urine 1.003 (L) 1.005 - 1.030   pH 7.0 5.0 - 8.0   Glucose, UA NEGATIVE NEGATIVE mg/dL   Hgb urine dipstick NEGATIVE NEGATIVE   Bilirubin Urine NEGATIVE NEGATIVE   Ketones, ur NEGATIVE NEGATIVE mg/dL   Protein, ur NEGATIVE NEGATIVE mg/dL   Nitrite NEGATIVE NEGATIVE   Leukocytes, UA NEGATIVE NEGATIVE    Comment: Performed at [facility], [address] [place], [place] [address]  Urine culture     Status: Abnormal   Collection Time: 02/11/18  2:13 PM  Result Value Ref Range   Specimen Description      URINE, CLEAN CATCH Performed at [facility], [address] [place], [place] [address]    Special Requests      NONE Performed at [facility], [address] [place], [place] [address]    Culture (A)     <10,000 COLONIES/mL INSIGNIFICANT GROWTH Performed at [facility] [address], [place], [place] [address]    Report Status 02/12/2018 FINAL   Surgical pcr screen     Status: None   Collection Time: 02/11/18  2:13 PM  Result Value Ref Range   MRSA, PCR NEGATIVE NEGATIVE   Staphylococcus aureus NEGATIVE NEGATIVE    Comment: (NOTE) The Xpert SA Assay (FDA approved for NASAL specimens in patients 82 years of age and older), is one component of a comprehensive surveillance program. It is not intended to diagnose infection nor to guide or monitor treatment. Performed at [facility], [address], [place], [place] [address]   Type and screen [facility]  Status: None   Collection Time: 02/11/18  2:13 PM  Result Value Ref Range   ABO/RH(D) A POS    Antibody Screen NEG    Sample Expiration 02/25/2018    Extend sample reason      NO TRANSFUSIONS OR PREGNANCY IN THE PAST 3 MONTHS Performed at [facility], [address] [place], [place] [address]   CBC with Differential/Platelet     Status: Abnormal   Collection Time: 02/11/18  2:30 PM  Result Value Ref Range    WBC 8.2 3.6 - 11.0 K/uL   RBC 4.74 3.80 - 5.20 MIL/uL   Hemoglobin 13.0 12.0 - 16.0 g/dL   HCT 40.1 35.0 - 47.0 %   MCV 84.6 80.0 - 100.0 fL   MCH 27.5 26.0 - 34.0 pg   MCHC 32.5 32.0 - 36.0 g/dL   RDW 14.9 (H) 11.5 - 14.5 %   Platelets 379 150 - 440 K/uL   Neutrophils Relative % 70 %   Neutro Abs 5.7 1.4 - 6.5 K/uL   Lymphocytes Relative 17 %   Lymphs Abs 1.4 1.0 - 3.6 K/uL   Monocytes Relative 11 %   Monocytes Absolute 0.9 0.2 - 0.9 K/uL   Eosinophils Relative 2 %   Eosinophils Absolute 0.1 0 - 0.7 K/uL   Basophils Relative 0 %   Basophils Absolute 0.0 0 - 0.1 K/uL    Comment: Performed at [facility], [address] [place], [place] [address]  Comprehensive metabolic panel     Status: None   Collection Time: 02/11/18  2:30 PM  Result Value Ref Range   Sodium 138 135 - 145 mmol/L   Potassium 3.8 3.5 - 5.1 mmol/L   Chloride 99 98 - 111 mmol/L    Comment: Please note change in reference range.   CO2 27 22 - 32 mmol/L   Glucose, Bld 97 70 - 99 mg/dL    Comment: Please note change in reference range.   BUN 21 8 - 23 mg/dL    Comment: Please note change in reference range.   Creatinine, Ser 0.82 0.44 - 1.00 mg/dL   Calcium 9.7 8.9 - 10.3 mg/dL   Total Protein 7.7 6.5 - 8.1 g/dL   Albumin 4.8 3.5 - 5.0 g/dL   AST 27 15 - 41 U/L   ALT 25 0 - 44 U/L    Comment: Please note change in reference range.   Alkaline Phosphatase 56 38 - 126 U/L   Total Bilirubin 0.4 0.3 - 1.2 mg/dL   GFR calc non Af Amer >60 >60 mL/min   GFR calc Af Amer >60 >60 mL/min    Comment: (NOTE) The eGFR has been calculated using the CKD EPI equation. This calculation has not been validated in all clinical situations. eGFR's persistently <60 mL/min signify possible Chronic Kidney Disease.    Anion gap 12 5 - 15    Comment: Performed at [facility], [address] [place], [place] [address]  Protime-INR     Status: None   Collection Time: 02/11/18  2:30 PM  Result Value Ref  Range   Prothrombin Time 12.3 11.4 - 15.2 seconds   INR 0.92     Comment: Performed at [facility], [address] [place], [place] [address]  Surgical pathology     Status: None   Collection Time: 02/22/18 11:27 AM  Result Value Ref Range   SURGICAL PATHOLOGY      Surgical Pathology CASE: [id number] PATIENT: Vanessa Carls  [facility]ology Report     SPECIMEN SUBMITTED: A. Lipoma shoulder, right B. Humeral  head, right  CLINICAL HISTORY: None provided  PRE-OPERATIVE DIAGNOSIS: Non traumatic tear of right rotator cuff, cuff tendinitis  POST-OPERATIVE DIAGNOSIS: Right rotator cuff tear     DIAGNOSIS: A. SOFT TISSUE MASS, RIGHT SHOULDER; REMOVAL: - LIPOMA.  Comment: The lipoma is adjacent to skeletal muscle but I do not see definite intramuscular involvement. Clinical correlation is recommended.  B. HUMERAL HEAD, RIGHT; REVERSE SHOULDER ARTHROPLASTY: - MILD OSTEOARTHRITIS. - NORMOCELLULAR BONE MARROW WITH NORMAL-APPEARING TRILINEAGE HEMATOPOIESIS.   GROSS DESCRIPTION: A. Labeled: Lipoma right shoulder Received: In formalin Tissue fragment(s): 1 Size: 4.1 x 3.6 x 3.0 cm Description: Roughly ovoid fragment of yellow lobulated fibrofatty tissue with focal attached muscle, inked blue and sectioned, soft  yellow fibrofatty Representative submitted in two cassettes.  B. Labeled: Right humeral head Received: In formalin Size of specimen:     - 4.2 x 3.8 x 1.6 cm Articular surface: Red-tan with focal granularity Cut surface: Yellow-tan Other findings: None noted  Block summary: 1 - representative section  Tissue decalcification: Yes   Final Diagnosis performed by Bryan Lemma, MD.   Electronically signed 02/25/2018 2:11:23PM The electronic signature indicates that the named Attending Pathologist has evaluated the specimen  Technical component performed at [facility], [address] [place], [place] [address] Lab: [phone] Dir: Rush Farmer, MD, MMM  Professional component performed at [facility], [place], [place], [place] [address] Lab: [phone] Dir: Dellia Nims. Rubinas, MD   CBC with Differential/Platelet     Status: Abnormal   Collection Time: 02/23/18  3:25 AM  Result Value Ref Range   WBC 10.5 3.6 - 11.0 K/uL   RBC 4.09 3.80 - 5.20 MIL/uL   Hemoglobin 11.6 (L) 12.0 - 16.0 g/dL   HCT 34.6 (L) 35.0 - 47.0 %   MCV 84.5 80.0 - 100.0 fL   MCH 28.3 26.0 - 34.0 pg   MCHC 33.5 32.0 - 36.0 g/dL   RDW 15.0 (H) 11.5 - 14.5 %   Platelets 249 150 - 440 K/uL   Neutrophils Relative % 84 %   Neutro Abs 8.9 (H) 1.4 - 6.5 K/uL   Lymphocytes Relative 6 %   Lymphs Abs 0.6 (L) 1.0 - 3.6 K/uL   Monocytes Relative 10 %   Monocytes Absolute 1.0 (H) 0.2 - 0.9 K/uL   Eosinophils Relative 0 %   Eosinophils Absolute 0.0 0 - 0.7 K/uL   Basophils Relative 0 %   Basophils Absolute 0.0 0 - 0.1 K/uL    Comment: Performed at [facility], [address], [place], [place] [address]  Basic metabolic panel     Status: Abnormal   Collection Time: 02/23/18  3:25 AM  Result Value Ref Range   Sodium 140 135 - 145 mmol/L   Potassium 2.8 (L) 3.5 - 5.1 mmol/L   Chloride 107 98 - 111 mmol/L    Comment: Please note change in reference range.   CO2 25 22 - 32 mmol/L   Glucose, Bld 117 (H) 70 - 99 mg/dL    Comment: Please note change in reference range.   BUN 7 (L) 8 - 23 mg/dL    Comment: Please note change in reference range.   Creatinine, Ser 0.53 0.44 - 1.00 mg/dL   Calcium 8.6 (L) 8.9 - 10.3 mg/dL   GFR calc non Af Amer >60 >60 mL/min   GFR calc Af Amer >60 >60 mL/min  Comment: (NOTE) The eGFR has been calculated using the CKD EPI equation. This calculation has not been validated in all clinical situations. eGFR's persistently <60 mL/min signify possible Chronic Kidney Disease.    Anion gap 8 5 - 15    Comment: Performed at [facility], [address] [place],  [place] [address]  Potassium     Status: None   Collection Time: 02/23/18  2:07 PM  Result Value Ref Range   Potassium 3.7 3.5 - 5.1 mmol/L    Comment: Performed at [facility], [address] [place], [place] [address]  Basic metabolic panel     Status: Abnormal   Collection Time: 02/24/18  3:09 AM  Result Value Ref Range   Sodium 136 135 - 145 mmol/L   Potassium 4.0 3.5 - 5.1 mmol/L   Chloride 105 98 - 111 mmol/L    Comment: Please note change in reference range.   CO2 26 22 - 32 mmol/L   Glucose, Bld 137 (H) 70 - 99 mg/dL    Comment: Please note change in reference range.   BUN 8 8 - 23 mg/dL    Comment: Please note change in reference range.   Creatinine, Ser 0.62 0.44 - 1.00 mg/dL   Calcium 8.6 (L) 8.9 - 10.3 mg/dL   GFR calc non Af Amer >60 >60 mL/min   GFR calc Af Amer >60 >60 mL/min    Comment: (NOTE) The eGFR has been calculated using the CKD EPI equation. This calculation has not been validated in all clinical situations. eGFR's persistently <60 mL/min signify possible Chronic Kidney Disease.    Anion gap 5 5 - 15    Comment: Performed at [facility], [address] [place], [place] [address]  UA/M w/rflx Culture, Routine     Status: Abnormal   Collection Time: 04/18/18 10:43 AM  Result Value Ref Range   Specific Gravity, UA 1.005 1.005 - 1.030   pH, UA 8.0 (H) 5.0 - 7.5   Color, UA Yellow Yellow   Appearance Ur Clear Clear   Leukocytes, UA Negative Negative   Protein, UA Negative Negative/Trace   Glucose, UA Negative Negative   Ketones, UA Negative Negative   RBC, UA Negative Negative   Bilirubin, UA Negative Negative   Urobilinogen, Ur 0.2 0.2 - 1.0 mg/dL   Nitrite, UA Negative Negative   Microscopic Examination Comment     Comment: Microscopic follows if indicated.   Microscopic Examination See below:     Comment: Microscopic was indicated and was performed.   Urinalysis Reflex Comment     Comment: This specimen will not reflex to a Urine  Culture.  Microscopic Examination     Status: None   Collection Time: 04/18/18 10:43 AM  Result Value Ref Range   WBC, UA None seen 0 - 5 /hpf   RBC, UA 0-2 0 - 2 /hpf   Epithelial Cells (non renal) None seen 0 - 10 /hpf   Casts None seen None seen /lpf   Mucus, UA Present Not Estab.   Bacteria, UA Few None seen/Few  Comprehensive metabolic panel     Status: Abnormal   Collection Time: 04/26/18  8:49 AM  Result Value Ref Range   Glucose 99 65 - 99 mg/dL   BUN 16 8 - 27 mg/dL   Creatinine, Ser 0.57 0.57 - 1.00 mg/dL   GFR calc non Af Amer 92 >59 mL/min/1.73   GFR calc Af Amer 106 >59 mL/min/1.73   BUN/Creatinine  Ratio 28 12 - 28   Sodium 138 134 - 144 mmol/L   Potassium 4.0 3.5 - 5.2 mmol/L   Chloride 102 96 - 106 mmol/L   CO2 20 20 - 29 mmol/L   Calcium 9.4 8.7 - 10.3 mg/dL   Total Protein 6.3 6.0 - 8.5 g/dL   Albumin 4.4 3.5 - 4.8 g/dL   Globulin, Total 1.9 1.5 - 4.5 g/dL   Albumin/Globulin Ratio 2.3 (H) 1.2 - 2.2   Bilirubin Total 0.2 0.0 - 1.2 mg/dL   Alkaline Phosphatase 64 39 - 117 IU/L   AST 21 0 - 40 IU/L   ALT 17 0 - 32 IU/L  CBC     Status: None   Collection Time: 04/26/18  8:49 AM  Result Value Ref Range   WBC 3.7 3.4 - 10.8 x10E3/uL   RBC 4.18 3.77 - 5.28 x10E6/uL   Hemoglobin 11.5 11.1 - 15.9 g/dL   Hematocrit 34.6 34.0 - 46.6 %   MCV 83 79 - 97 fL   MCH 27.5 26.6 - 33.0 pg   MCHC 33.2 31.5 - 35.7 g/dL   RDW 13.7 12.3 - 15.4 %   Platelets 337 150 - 450 x10E3/uL  Lipid Panel w/o Chol/HDL Ratio     Status: None   Collection Time: 04/26/18  8:49 AM  Result Value Ref Range   Cholesterol, Total 146 100 - 199 mg/dL   Triglycerides 80 0 - 149 mg/dL   HDL 58 >39 mg/dL   VLDL Cholesterol Cal 16 5 - 40 mg/dL   LDL Calculated 72 0 - 99 mg/dL  Iron and TIBC     Status: Abnormal   Collection Time: 04/26/18  8:49 AM  Result Value Ref Range   Total Iron Binding Capacity 501 (H) 250 - 450 ug/dL   UIBC 459 (H) 118 - 369 ug/dL   Iron 42 27 - 139 ug/dL   Iron  Saturation 8 (LL) 15 - 55 %  B12 and Folate Panel     Status: None   Collection Time: 04/26/18  8:49 AM  Result Value Ref Range   Vitamin B-12 925 232 - 1,245 pg/mL   Folate >20.0 >3.0 ng/mL    Comment: A serum folate concentration of less than 3.1 ng/mL is considered to represent clinical deficiency.   VITAMIN D 25 Hydroxy (Vit-D Deficiency, Fractures)     Status: None   Collection Time: 04/26/18  8:49 AM  Result Value Ref Range   Vit D, 25-Hydroxy 59.7 30.0 - 100.0 ng/mL    Comment: Vitamin D deficiency has been defined by the [employer] practice guideline as a level of serum 25-OH vitamin D less than 20 ng/mL (1,2). The Endocrine Society went on to further define vitamin D insufficiency as a level between 21 and 29 ng/mL (2). 1. IOM (Institute of Medicine). 2010. Dietary reference    intakes for calcium and D. [place]: The    [employer] [employer]. 2. Holick MF, Binkley Crosby, Bischoff-Ferrari HA, et al.    Evaluation, treatment, and prevention of vitamin D    deficiency: an Endocrine Society clinical practice    guideline. JCEM. 2011 Jul; 96(7):1911-30.   Ferritin     Status: Abnormal   Collection Time: 04/26/18  8:49 AM  Result Value Ref Range   Ferritin 13 (L) 15 - 150 ng/mL  Urinalysis, Complete w Microscopic     Status: Abnormal   Collection Time: 05/05/18  1:20 PM  Result  Value Ref Range   Color, Urine COLORLESS (A) YELLOW   APPearance CLEAR (A) CLEAR   Specific Gravity, Urine 1.002 (L) 1.005 - 1.030   pH 8.0 5.0 - 8.0   Glucose, UA NEGATIVE NEGATIVE mg/dL   Hgb urine dipstick NEGATIVE NEGATIVE   Bilirubin Urine NEGATIVE NEGATIVE   Ketones, ur NEGATIVE NEGATIVE mg/dL   Protein, ur NEGATIVE NEGATIVE mg/dL   Nitrite NEGATIVE NEGATIVE   Leukocytes, UA NEGATIVE NEGATIVE   RBC / HPF 0-5 0 - 5 RBC/hpf   WBC, UA 0-5 0 - 5 WBC/hpf   Bacteria, UA NONE SEEN NONE SEEN   Squamous Epithelial / LPF NONE SEEN 0 - 5   Mucus PRESENT      Comment: Performed at [facility], [address], [place], [place] [address]    Radiology: Dg Hip Unilat With Pelvis 2-3 Views Right  Result Date: 04/19/2018 CLINICAL DATA:  Fall 3 weeks ago with persistent right hip pain, initial encounter EXAM: DG HIP (WITH OR WITHOUT PELVIS) 3V RIGHT COMPARISON:  None. FINDINGS: Pelvic ring is intact. Mild degenerative changes of the pubic symphysis and lumbar spine are seen. Mild degenerative change of the hip joints is noted bilaterally. No acute fracture or dislocation is seen. No soft tissue changes are noted. IMPRESSION: Mild degenerative change without acute abnormality. Electronically Signed   By: Inez Catalina M.D.   On: 04/19/2018 08:06    No results found.  Dg Hip Unilat With Pelvis 2-3 Views Right  Result Date: 04/19/2018 CLINICAL DATA:  Fall 3 weeks ago with persistent right hip pain, initial encounter EXAM: DG HIP (WITH OR WITHOUT PELVIS) 3V RIGHT COMPARISON:  None. FINDINGS: Pelvic ring is intact. Mild degenerative changes of the pubic symphysis and lumbar spine are seen. Mild degenerative change of the hip joints is noted bilaterally. No acute fracture or dislocation is seen. No soft tissue changes are noted. IMPRESSION: Mild degenerative change without acute abnormality. Electronically Signed   By: Inez Catalina M.D.   On: 04/19/2018 08:06      Assessment and Plan: Patient Active Problem List   Diagnosis Date Noted  . Iron deficiency anemia 05/05/2018  . Right hip pain 04/27/2018  . Accidental fall from furniture 04/27/2018  . Other fatigue 04/27/2018  . Vitamin D deficiency 04/27/2018  . Cough 04/18/2018  . Dysuria 04/18/2018  . Status post reverse total shoulder replacement, right 02/22/2018  . Pain in joint of right shoulder 02/13/2018  . Abdominal aortic atherosclerosis ([place]) 02/13/2018  . Episode of recurrent major depressive disorder ([place]) 02/13/2018  . Encounter for general adult medical examination with  abnormal findings 02/13/2018  . Nausea 11/08/2017  . Acute gastritis 11/08/2017  . Allergic rhinitis 09/20/2017  . Eczema 09/20/2017  . Hematuria 09/20/2017  . Mixed hyperlipidemia 09/20/2017  . Osteoarthritis 09/20/2017  . Osteoporosis, post-menopausal 09/20/2017  . Palpitations 07/05/2017  . Epigastric pain 05/24/2017  . Gastroesophageal reflux disease without esophagitis 05/24/2017  . Impingement syndrome of left shoulder 10/02/2016  . Trochanteric bursitis of left hip 10/02/2016  . SOB (shortness of breath) 04/16/2016  . Uterine prolapse 04/09/2016  . Cystocele 04/09/2016  . Hyponatremia 04/09/2016  . Benign essential HTN 11/18/2015  . Paroxysmal supraventricular tachycardia ([place]) 11/18/2015  . Premature ventricular contraction 07/20/2014  . History of colonic polyps 05/29/2014  . Asthma 02/05/2014  . Chest pain 02/05/2014  . Increased frequency of urination 02/05/2014  . Tachycardia 02/05/2014  . Female stress incontinence 12/14/2013  . Gross hematuria 12/14/2013  . Incomplete  emptying of bladder 12/14/2013  . Other chronic cystitis without hematuria 12/14/2013    1. Asthma stable at this time will continue with current meds 2. Allergic rhinitis controlled no issues 3. Shoulder surgery doing well will continue with her rehab 4. GERD controlled  General Counseling: I have discussed the findings of the evaluation and examination with Vanessa Evans.  I have also discussed any further diagnostic evaluation thatmay be needed or ordered today. Vanessa Evans verbalizes understanding of the findings of todays visit. We also reviewed her medications today and discussed drug interactions and side effects including but not limited excessive drowsiness and altered mental states. We also discussed that there is always a risk not just to her but also people around her. she has been encouraged to call the office with any questions or concerns that should arise related to todays visit.    Time spent:  51mn  I have personally obtained a history, examined the patient, evaluated laboratory and imaging results, formulated the assessment and plan and placed orders.    SAllyne Gee MD FA[facility]ulmonary [facility] Sleep medicine

## 2018-05-10 DIAGNOSIS — M7062 Trochanteric bursitis, left hip: Secondary | ICD-10-CM | POA: Diagnosis not present

## 2018-05-10 DIAGNOSIS — Z96611 Presence of right artificial shoulder joint: Secondary | ICD-10-CM | POA: Diagnosis not present

## 2018-05-10 DIAGNOSIS — M25552 Pain in left hip: Secondary | ICD-10-CM | POA: Diagnosis not present

## 2018-05-10 DIAGNOSIS — M25551 Pain in right hip: Secondary | ICD-10-CM | POA: Diagnosis not present

## 2018-05-11 DIAGNOSIS — R7303 Prediabetes: Secondary | ICD-10-CM | POA: Diagnosis not present

## 2018-05-11 DIAGNOSIS — E039 Hypothyroidism, unspecified: Secondary | ICD-10-CM | POA: Diagnosis not present

## 2018-05-11 DIAGNOSIS — E042 Nontoxic multinodular goiter: Secondary | ICD-10-CM | POA: Diagnosis not present

## 2018-05-13 ENCOUNTER — Inpatient Hospital Stay: Payer: Medicare Other | Attending: Oncology

## 2018-05-13 VITALS — BP 132/65 | HR 65 | Temp 97.3°F | Resp 18

## 2018-05-13 DIAGNOSIS — D509 Iron deficiency anemia, unspecified: Secondary | ICD-10-CM

## 2018-05-13 DIAGNOSIS — Z96611 Presence of right artificial shoulder joint: Secondary | ICD-10-CM | POA: Diagnosis not present

## 2018-05-13 MED ORDER — SODIUM CHLORIDE 0.9 % IV SOLN
Freq: Once | INTRAVENOUS | Status: AC
  Administered 2018-05-13: 14:00:00 via INTRAVENOUS
  Filled 2018-05-13: qty 250

## 2018-05-13 MED ORDER — SODIUM CHLORIDE 0.9 % IV SOLN
510.0000 mg | Freq: Once | INTRAVENOUS | Status: AC
  Administered 2018-05-13: 510 mg via INTRAVENOUS
  Filled 2018-05-13: qty 17

## 2018-05-16 ENCOUNTER — Ambulatory Visit (INDEPENDENT_AMBULATORY_CARE_PROVIDER_SITE_OTHER): Payer: Medicare Other

## 2018-05-16 DIAGNOSIS — J301 Allergic rhinitis due to pollen: Secondary | ICD-10-CM

## 2018-05-16 DIAGNOSIS — R002 Palpitations: Secondary | ICD-10-CM | POA: Diagnosis not present

## 2018-05-16 DIAGNOSIS — I471 Supraventricular tachycardia: Secondary | ICD-10-CM | POA: Diagnosis not present

## 2018-05-16 DIAGNOSIS — Z96611 Presence of right artificial shoulder joint: Secondary | ICD-10-CM | POA: Diagnosis not present

## 2018-05-16 DIAGNOSIS — I10 Essential (primary) hypertension: Secondary | ICD-10-CM | POA: Diagnosis not present

## 2018-05-16 DIAGNOSIS — I700 Atherosclerosis of aorta: Secondary | ICD-10-CM | POA: Diagnosis not present

## 2018-05-19 DIAGNOSIS — Z96611 Presence of right artificial shoulder joint: Secondary | ICD-10-CM | POA: Diagnosis not present

## 2018-05-20 ENCOUNTER — Inpatient Hospital Stay: Payer: Medicare Other

## 2018-05-20 VITALS — BP 132/66 | HR 76 | Temp 97.0°F | Resp 20

## 2018-05-20 DIAGNOSIS — D509 Iron deficiency anemia, unspecified: Secondary | ICD-10-CM | POA: Diagnosis not present

## 2018-05-20 MED ORDER — SODIUM CHLORIDE 0.9 % IV SOLN
510.0000 mg | Freq: Once | INTRAVENOUS | Status: AC
  Administered 2018-05-20: 510 mg via INTRAVENOUS
  Filled 2018-05-20: qty 17

## 2018-05-20 MED ORDER — SODIUM CHLORIDE 0.9 % IV SOLN
Freq: Once | INTRAVENOUS | Status: AC
  Administered 2018-05-20: 14:00:00 via INTRAVENOUS
  Filled 2018-05-20: qty 250

## 2018-05-23 DIAGNOSIS — Z96611 Presence of right artificial shoulder joint: Secondary | ICD-10-CM | POA: Diagnosis not present

## 2018-05-25 DIAGNOSIS — D509 Iron deficiency anemia, unspecified: Secondary | ICD-10-CM | POA: Diagnosis not present

## 2018-05-27 DIAGNOSIS — Z96611 Presence of right artificial shoulder joint: Secondary | ICD-10-CM | POA: Diagnosis not present

## 2018-05-30 ENCOUNTER — Ambulatory Visit (INDEPENDENT_AMBULATORY_CARE_PROVIDER_SITE_OTHER): Payer: Medicare Other

## 2018-05-30 DIAGNOSIS — J301 Allergic rhinitis due to pollen: Secondary | ICD-10-CM | POA: Diagnosis not present

## 2018-05-30 DIAGNOSIS — Z96611 Presence of right artificial shoulder joint: Secondary | ICD-10-CM | POA: Diagnosis not present

## 2018-06-02 DIAGNOSIS — Z96611 Presence of right artificial shoulder joint: Secondary | ICD-10-CM | POA: Diagnosis not present

## 2018-06-06 DIAGNOSIS — Z96611 Presence of right artificial shoulder joint: Secondary | ICD-10-CM | POA: Diagnosis not present

## 2018-06-10 DIAGNOSIS — Z96611 Presence of right artificial shoulder joint: Secondary | ICD-10-CM | POA: Diagnosis not present

## 2018-06-13 ENCOUNTER — Ambulatory Visit (INDEPENDENT_AMBULATORY_CARE_PROVIDER_SITE_OTHER): Payer: Medicare Other

## 2018-06-13 DIAGNOSIS — J301 Allergic rhinitis due to pollen: Secondary | ICD-10-CM | POA: Diagnosis not present

## 2018-06-13 DIAGNOSIS — L821 Other seborrheic keratosis: Secondary | ICD-10-CM | POA: Diagnosis not present

## 2018-06-13 DIAGNOSIS — D1722 Benign lipomatous neoplasm of skin and subcutaneous tissue of left arm: Secondary | ICD-10-CM | POA: Diagnosis not present

## 2018-06-13 DIAGNOSIS — S90121A Contusion of right lesser toe(s) without damage to nail, initial encounter: Secondary | ICD-10-CM | POA: Diagnosis not present

## 2018-06-13 DIAGNOSIS — Z96611 Presence of right artificial shoulder joint: Secondary | ICD-10-CM | POA: Diagnosis not present

## 2018-06-13 DIAGNOSIS — D1721 Benign lipomatous neoplasm of skin and subcutaneous tissue of right arm: Secondary | ICD-10-CM | POA: Diagnosis not present

## 2018-06-16 DIAGNOSIS — Z96611 Presence of right artificial shoulder joint: Secondary | ICD-10-CM | POA: Diagnosis not present

## 2018-06-17 DIAGNOSIS — Z23 Encounter for immunization: Secondary | ICD-10-CM | POA: Diagnosis not present

## 2018-06-27 ENCOUNTER — Ambulatory Visit: Payer: Self-pay

## 2018-06-27 DIAGNOSIS — M25552 Pain in left hip: Secondary | ICD-10-CM | POA: Diagnosis not present

## 2018-06-27 DIAGNOSIS — M25551 Pain in right hip: Secondary | ICD-10-CM | POA: Diagnosis not present

## 2018-06-29 ENCOUNTER — Ambulatory Visit (INDEPENDENT_AMBULATORY_CARE_PROVIDER_SITE_OTHER): Payer: Medicare Other

## 2018-06-29 ENCOUNTER — Inpatient Hospital Stay: Payer: Medicare Other | Attending: Oncology

## 2018-06-29 DIAGNOSIS — J301 Allergic rhinitis due to pollen: Secondary | ICD-10-CM | POA: Diagnosis not present

## 2018-06-29 DIAGNOSIS — R5383 Other fatigue: Secondary | ICD-10-CM | POA: Insufficient documentation

## 2018-06-29 DIAGNOSIS — G8929 Other chronic pain: Secondary | ICD-10-CM | POA: Insufficient documentation

## 2018-06-29 DIAGNOSIS — R1013 Epigastric pain: Secondary | ICD-10-CM | POA: Insufficient documentation

## 2018-06-29 DIAGNOSIS — D509 Iron deficiency anemia, unspecified: Secondary | ICD-10-CM | POA: Diagnosis not present

## 2018-06-29 DIAGNOSIS — R5381 Other malaise: Secondary | ICD-10-CM | POA: Insufficient documentation

## 2018-06-29 LAB — CBC WITH DIFFERENTIAL/PLATELET
Abs Immature Granulocytes: 0.02 10*3/uL (ref 0.00–0.07)
BASOS PCT: 1 %
Basophils Absolute: 0 10*3/uL (ref 0.0–0.1)
EOS ABS: 0.2 10*3/uL (ref 0.0–0.5)
EOS PCT: 3 %
HCT: 39 % (ref 36.0–46.0)
HEMOGLOBIN: 12 g/dL (ref 12.0–15.0)
Immature Granulocytes: 0 %
LYMPHS PCT: 20 %
Lymphs Abs: 1.2 10*3/uL (ref 0.7–4.0)
MCH: 27.9 pg (ref 26.0–34.0)
MCHC: 30.8 g/dL (ref 30.0–36.0)
MCV: 90.7 fL (ref 80.0–100.0)
MONO ABS: 0.7 10*3/uL (ref 0.1–1.0)
Monocytes Relative: 12 %
NRBC: 0 % (ref 0.0–0.2)
Neutro Abs: 4 10*3/uL (ref 1.7–7.7)
Neutrophils Relative %: 64 %
Platelets: 264 10*3/uL (ref 150–400)
RBC: 4.3 MIL/uL (ref 3.87–5.11)
RDW: 16.7 % — AB (ref 11.5–15.5)
WBC: 6.1 10*3/uL (ref 4.0–10.5)

## 2018-06-29 LAB — IRON AND TIBC
IRON: 103 ug/dL (ref 28–170)
SATURATION RATIOS: 26 % (ref 10.4–31.8)
TIBC: 400 ug/dL (ref 250–450)
UIBC: 297 ug/dL

## 2018-06-29 LAB — FERRITIN: Ferritin: 294 ng/mL (ref 11–307)

## 2018-06-30 LAB — CELIAC DISEASE PANEL
ENDOMYSIAL ANTIBODY IGA: NEGATIVE
IgA: 156 mg/dL (ref 64–422)
Tissue Transglutaminase Ab, IgA: <2 U/mL (ref 0–3)

## 2018-07-01 ENCOUNTER — Encounter: Payer: Self-pay | Admitting: Oncology

## 2018-07-01 ENCOUNTER — Inpatient Hospital Stay (HOSPITAL_BASED_OUTPATIENT_CLINIC_OR_DEPARTMENT_OTHER): Payer: Medicare Other | Admitting: Oncology

## 2018-07-01 VITALS — BP 144/75 | HR 67 | Temp 99.1°F | Resp 18 | Ht 63.0 in | Wt 130.4 lb

## 2018-07-01 DIAGNOSIS — R5383 Other fatigue: Secondary | ICD-10-CM

## 2018-07-01 DIAGNOSIS — D509 Iron deficiency anemia, unspecified: Secondary | ICD-10-CM | POA: Diagnosis not present

## 2018-07-01 DIAGNOSIS — R1013 Epigastric pain: Secondary | ICD-10-CM

## 2018-07-01 DIAGNOSIS — R5381 Other malaise: Secondary | ICD-10-CM

## 2018-07-01 DIAGNOSIS — G8929 Other chronic pain: Secondary | ICD-10-CM

## 2018-07-01 NOTE — Progress Notes (Signed)
Abdomen pain start 30 min after eatting

## 2018-07-01 NOTE — Progress Notes (Signed)
Hematology/Oncology Consult note [facility]  [phone] [phone]  Patient Care Team: Lavera Guise, MD as PCP - General (Internal Medicine)   Name of the patient: Vanessa Evans  [id number]  09/22/1945   Date of visit: 07/01/18  Diagnosis- iron deficiency anemia  Chief complaint/ Reason for visit-routine follow-up of iron deficiency anemia  Heme/Onc history: Patient is a 72 year old female who is been referred to [place] for iron deficiency anemia.  Most recent labs from 04/26/2018 showed CBC of white count of 3.7, H&H of 11.5/34.6 and a platelet count of 337.  Iron study showed a low iron saturation of 8%, elevated TIBC of 501.  Ferritin levels were low at 13.  B12 and folate were within normal limits.  Patient has seen [name] in the past and underwent an endoscopy in January 2019 which showed gastric erosion and features of chronic gastritis.  Biopsy was negative for H. pylori.  Colonoscopy showed no evidence of active bleeding.  Patient also had small bowel capsule endoscopy study which did not show any active bleeding but showed tiny fragile blood vessels in the small bowel which could be possibly the source of bleeding. She reports feeling fatigued and has chronic joint pain.  She does have chronic epigastric pain on and off.  She received 2 doses of Feraheme in October 2019   Interval history-she still reports mild fatigue and still continues to have epigastric abdominal pain which she feels is gradually getting worse over the last 1 month.  ECOG PS- 1 Pain scale- 5   Review of systems- Review of Systems  Constitutional: Positive for malaise/fatigue. Negative for chills, fever and weight loss.  HENT: Negative for congestion, ear discharge and nosebleeds.   Eyes: Negative for blurred vision.  Respiratory: Negative for cough, hemoptysis, sputum production, shortness of breath and wheezing.   Cardiovascular: Negative for chest  pain, palpitations, orthopnea and claudication.  Gastrointestinal: Positive for abdominal pain. Negative for blood in stool, constipation, diarrhea, heartburn, melena, nausea and vomiting.  Genitourinary: Negative for dysuria, flank pain, frequency, hematuria and urgency.  Musculoskeletal: Negative for back pain, joint pain and myalgias.  Skin: Negative for rash.  Neurological: Negative for dizziness, tingling, focal weakness, seizures, weakness and headaches.  Endo/Heme/Allergies: Does not bruise/bleed easily.  Psychiatric/Behavioral: Negative for depression and suicidal ideas. The patient does not have insomnia.       Allergies  Allergen Reactions  . Codeine Shortness Of Breath and Itching    Bronchospasms and asthma attach  . Levofloxacin     Altered mental status": drunk" feeling, confused, speech problems Other reaction(s): Unknown   . Ultram [Tramadol] Itching and Other (See Comments)    Bronchospasm and asthma attack  . Nsaids Other (See Comments)    Bloody stools, abdominal pain History of gastritis  . Oxycontin [Oxycodone] Itching    Would take benadryl to eliminate itching. Has had bronchospasm  . Sulfacetamide Rash    Fever, abdominal pain  . Adhesive [Tape] Other (See Comments)    Thin skin. Causes tears. Paper tape okay  . Azithromycin     Unsure. Patient does not remember but thinks that it did not work for her.  . Ciprofloxacin Other (See Comments)    Severe pain in neck and down back   . Ketorolac Itching  . Nitrofurantoin Diarrhea    Tried again and had no problems with it  . Sulfa Antibiotics Itching, Rash and Other (See Comments)    fever  .  Tolmetin     Other reaction(s): Other (See Comments) Bloody stools, abdominal pain History of chronic gastritis  . Zofran [Ondansetron Hcl] Other (See Comments)    constipation     Past Medical History:  Diagnosis Date  . Arthritis    osteo  . Asthma    needs rescue inhaler few times a year  . Depression     . Dizziness    light headed spells but it has been a while  . Dysrhythmia    tachycardia.(cardizem). brady during procedures  . GERD (gastroesophageal reflux disease)    gastritis  . Headache(784.0)   . Heart murmur 2019   aortic. dr. Nehemiah Massed not worried about this  . Hyperlipidemia   . Hypertension   . Hypothyroidism   . Migraine   . Orthopnea   . Pneumonia    long time ago (greater than 5 years ago)  . PONV (postoperative nausea and vomiting)    very anxious during cataract surgery. brady during colonoscopy  . Shortness of breath    any exertion, cannot lie flat     Past Surgical History:  Procedure Laterality Date  . 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 BIOPSY THYROID  2018  . bladder biopsies  2003   9 biopsies done by dr. cope.  all benign.  inflammatory process going on in bladder  . BREAST BIOPSY Right    benign  . BREAST SURGERY Right    lumpectomy  . CATARACT EXTRACTION W/PHACO Left 12/25/2014   Procedure: CATARACT EXTRACTION PHACO AND INTRAOCULAR LENS PLACEMENT (IOC);  Surgeon: Birder Robson, MD;  Location: [facility] ORS;  Service: Ophthalmology;  Laterality: Left;  [place] 00:32 AP% 20.0 CDE 6.49  . COLONOSCOPY W/ BIOPSIES    . COLONOSCOPY W/ POLYPECTOMY  2002, 2004, 2005   adenomatous polyps removed  . COLONOSCOPY WITH PROPOFOL N/A 08/23/2017   Procedure: COLONOSCOPY WITH PROPOFOL;  Surgeon: Manya Silvas, MD;  Location: [facility] ENDOSCOPY;  Service: Endoscopy;  Laterality: N/A;  . COLONOSCOPY WITH PROPOFOL N/A 11/08/2017   Procedure: COLONOSCOPY WITH PROPOFOL;  Surgeon: Manya Silvas, MD;  Location: [facility] ENDOSCOPY;  Service: Endoscopy;  Laterality: N/A;  . CYSTOCELE REPAIR N/A 04/09/2016   Procedure: ANTERIOR REPAIR (CYSTOCELE);  Surgeon: Gae Dry, MD;  Location: [facility] ORS;  Service: Gynecology;  Laterality: N/A;  . DIAGNOSTIC LAPAROSCOPY  2008   removed both ovaries with cysts, tubes and fibroids   . DILATION AND CURETTAGE OF UTERUS  1973  . ESOPHAGOGASTRODUODENOSCOPY (EGD) WITH PROPOFOL N/A 08/23/2017   Procedure: ESOPHAGOGASTRODUODENOSCOPY (EGD) WITH PROPOFOL;  Surgeon: Manya Silvas, MD;  Location: [facility] ENDOSCOPY;  Service: Endoscopy;  Laterality: N/A;  . EYE SURGERY Bilateral 2016  . HERNIA REPAIR Right 2008   Inguinal hernia Repair, ventral hernia repair  . JOINT REPLACEMENT Right 2008   knee  . KNEE ARTHROSCOPY Right   . LUMBAR LAMINECTOMY/DECOMPRESSION MICRODISCECTOMY Right 03/21/2013   Procedure: Right Lumbar five-Sacral one Laminectomy for Synovial Cyst;  Surgeon: Faythe Ghee, MD;  Location: [facility] NEURO ORS;  Service: Neurosurgery;  Laterality: Right;  right  . OOPHORECTOMY    . REVERSE SHOULDER ARTHROPLASTY Right 02/22/2018   Procedure: REVERSE SHOULDER ARTHROPLASTY;  Surgeon: Corky Mull, MD;  Location: [facility] ORS;  Service: Orthopedics;  Laterality: Right;  . TUBAL LIGATION    . UNILATERAL SALPINGECTOMY Left 04/09/2016   Procedure: UNILATERAL SALPINGECTOMY;  Surgeon: Gae Dry, MD;  Location:  [facility] ORS;  Service: Gynecology;  Laterality: Left;  . UPPER GI ENDOSCOPY  2010   with biopsy of gastric erosion  . VAGINAL HYSTERECTOMY N/A 04/09/2016   Procedure: HYSTERECTOMY VAGINAL;  Surgeon: Gae Dry, MD;  Location: [facility] ORS;  Service: Gynecology;  Laterality: N/A;  . VAGINAL HYSTERECTOMY  2017   and bladder tack    Social History   Socioeconomic History  . Marital status: Married    Spouse name: Not on file  . Number of children: Not on file  . Years of education: Not on file  . Highest education level: Not on file  Occupational History  . Not on file  Social Needs  . Financial resource strain: Not on file  . Food insecurity:    Worry: Not on file    Inability: Not on file  . Transportation needs:    Medical: Not on file    Non-medical: Not on file  Tobacco Use  . Smoking status: Former Smoker    Types: Cigarettes  . Smokeless tobacco: Never  Used  Substance and Sexual Activity  . Alcohol use: Not Currently    Comment: occasional wine  . Drug use: No  . Sexual activity: Not Currently  Lifestyle  . Physical activity:    Days per week: Not on file    Minutes per session: Not on file  . Stress: Not on file  Relationships  . Social connections:    Talks on phone: Not on file    Gets together: Not on file    Attends religious service: Not on file    Active member of club or organization: Not on file    Attends meetings of clubs or organizations: Not on file    Relationship status: Not on file  . Intimate partner violence:    Fear of current or ex partner: Not on file    Emotionally abused: Not on file    Physically abused: Not on file    Forced sexual activity: Not on file  Other Topics Concern  . Not on file  Social History Narrative  . Not on file    Family History  Problem Relation Age of Onset  . Pulmonary fibrosis Mother   . Melanoma Father   . Cardiomyopathy Sister        and arrhythmia     Current Outpatient Medications:  .  aspirin EC 81 MG tablet, Take 81 mg by mouth at bedtime. , Disp: , Rfl:  .  Calcium Carbonate-Vitamin D (CALCIUM 600+D) 600-400 MG-UNIT tablet, Take 1 tablet by mouth 2 (two) times daily., Disp: , Rfl:  .  cholecalciferol (VITAMIN D) 1000 UNITS tablet, Take 1,000 Units by mouth at bedtime. , Disp: , Rfl:  .  citalopram (CELEXA) 10 MG tablet, Take 1 tablet (10 mg total) by mouth at bedtime., Disp: 30 tablet, Rfl: 5 .  cyanocobalamin 500 MCG tablet, Take 500 mcg by mouth daily., Disp: , Rfl:  .  diltiazem (CARDIZEM CD) 120 MG 24 hr capsule, Take 120 mg by mouth at bedtime. , Disp: , Rfl: 3 .  fenofibrate 160 MG tablet, take1 tab po daily for chol (Patient taking differently: Take 160 mg by mouth every evening. ), Disp: 30 tablet, Rfl: 5 .  folic acid (FOLVITE) 629 MCG tablet, Take 400 mcg by mouth daily., Disp: , Rfl:  .  levothyroxine (SYNTHROID, LEVOTHROID) 50 MCG tablet, Take 50 mcg  by mouth daily before breakfast. Brand Name only, Disp: , Rfl:  .  Magnesium 500 MG TABS, Take 100 mg by mouth at bedtime. , Disp: , Rfl:  .  Melatonin 5 MG TABS, Take 1 tablet by mouth at bedtime. For sleep , Disp: , Rfl:  .  Multiple Vitamins-Minerals (MULTIVITAMIN WITH MINERALS) tablet, Take 1 tablet by mouth daily., Disp: , Rfl:  .  pantoprazole (PROTONIX) 40 MG tablet, Take 40 mg by mouth 2 (two) times daily., Disp: , Rfl:  .  pyridoxine (B-6) 100 MG tablet, Take 100 mg by mouth daily., Disp: , Rfl:  .  sucralfate (CARAFATE) 1 g tablet, Take 1 g by mouth 3 (three) times daily between meals., Disp: , Rfl:  .  zinc gluconate 50 MG tablet, Take 50 mg by mouth daily., Disp: , Rfl:  .  acetaminophen (TYLENOL) 500 MG tablet, Take 1,000 mg by mouth 2 (two) times daily as needed for moderate pain., Disp: , Rfl:  .  acetaminophen (TYLENOL) 650 MG CR tablet, Take 1,300 mg by mouth at bedtime as needed for pain., Disp: , Rfl:  .  albuterol (PROVENTIL) (2.5 MG/3ML) 0.083% nebulizer solution, Take 3 mLs (2.5 mg total) by nebulization every 6 (six) hours as needed for wheezing or shortness of breath. (Patient not taking: Reported on 07/01/2018), Disp: 75 mL, Rfl: 12 .  albuterol (VENTOLIN HFA) 108 (90 Base) MCG/ACT inhaler, Inhale 2 puffs into the lungs every 4 (four) hours as needed for wheezing or shortness of breath. (Patient not taking: Reported on 07/01/2018), Disp: 18 g, Rfl: 0 .  amoxicillin-clavulanate (AUGMENTIN) 875-125 MG tablet, Take 1 tablet by mouth 2 (two) times daily. (Patient not taking: Reported on 04/18/2018), Disp: 20 tablet, Rfl: 0 .  benzonatate (TESSALON) 100 MG capsule, Take 2 capsules (200 mg total) by mouth 2 (two) times daily as needed for cough. (Patient not taking: Reported on 07/01/2018), Disp: 60 capsule, Rfl: 3 .  cyclobenzaprine (FLEXERIL) 10 MG tablet, Take 0.5 tablets (5 mg total) by mouth 2 (two) times daily as needed for muscle spasms. (Patient not taking: Reported on  04/18/2018), Disp: 45 tablet, Rfl: 2 .  diazepam (VALIUM) 5 MG tablet, Take 1 tablet po 1.5 hours pror to procedure. May repeat dose in 1 hour if needed. (Patient not taking: Reported on 05/05/2018), Disp: 2 tablet, Rfl: 0 .  diclofenac sodium (VOLTAREN) 1 % GEL, Apply 4 g topically 4 (four) times daily as needed (pain). (Patient not taking: Reported on 07/01/2018), Disp: 5 Tube, Rfl: 3 .  diphenhydrAMINE (BENADRYL) 25 MG tablet, Take 25-50 mg by mouth daily as needed for itching., Disp: , Rfl:  .  docusate sodium (COLACE) 100 MG capsule, Take 200 mg by mouth at bedtime as needed for moderate constipation. , Disp: , Rfl:  .  EPIPEN 2-PAK 0.3 MG/0.3ML SOAJ injection, use as directed for severe allergy reaction (Patient not taking: Reported on 07/01/2018), Disp: 2 Device, Rfl: 0 .  estradiol (ESTRACE VAGINAL) 0.1 MG/GM vaginal cream, Apply a pea-sized amount to urethra at bedtime (Patient not taking: Reported on 07/01/2018), Disp: 42.5 g, Rfl: 5 .  guaiFENesin (MUCINEX) 600 MG 12 hr tablet, Take 600 mg by mouth at bedtime as needed (congestion)., Disp: , Rfl:  .  HYDROcodone-acetaminophen (NORCO) 7.5-325 MG tablet, Take 1-2 tablets by mouth every 4 (four) hours as needed for severe pain (pain score 7-10). (Patient not taking: Reported on 04/18/2018), Disp: 60 tablet, Rfl: 0 .  Hypromellose (ARTIFICIAL TEARS OP), Place 1-2 drops into both eyes daily as needed (dry eyes)., Disp: , Rfl:  .  ketotifen (ZADITOR) 0.025 % ophthalmic solution, Place 1 drop into both eyes 2 (two) times daily as needed (allergies)., Disp: , Rfl:  .  levocetirizine (XYZAL) 5 MG tablet, Take 1 tablet (5 mg total) by mouth daily as needed. (Patient not taking: Reported on 07/01/2018), Disp: 30 tablet, Rfl: 5 .  methocarbamol (ROBAXIN) 500 MG tablet, Take 1 tablet (500 mg total) by mouth every 6 (six) hours as needed for muscle spasms., Disp: 30 tablet, Rfl: 0 .  omeprazole (PRILOSEC) 40 MG capsule, Take 40 mg by mouth 2 (two) times daily.  , Disp: , Rfl: 0 .  phenazopyridine (PYRIDIUM) 200 MG tablet, Take 1 tablet (200 mg total) by mouth 3 (three) times daily as needed for pain. (Patient not taking: Reported on 04/18/2018), Disp: 10 tablet, Rfl: 0 .  Phenylephrine-guaiFENesin 10-400 MG TABS, Take 1 tablet by mouth every 4 (four) hours as needed (congestion)., Disp: , Rfl:  .  predniSONE (STERAPRED UNI-PAK 48 TAB) 10 MG (48) TBPK tablet, 12 day dose pack. Take by mouth as directed for 12 days (Patient not taking: Reported on 01/24/2018), Disp: 48 tablet, Rfl: 0 .  sodium chloride (OCEAN) 0.65 % SOLN nasal spray, Place 1 spray into both nostrils 2 (two) times daily., Disp: , Rfl:   Physical exam:  Vitals:   07/01/18 1035  BP: (!) 144/75  Pulse: 67  Resp: 18  Temp: 99.1 F (37.3 C)  TempSrc: Tympanic  SpO2: 97%  Weight: 130 lb 6.4 oz (59.1 kg)  Height: 5\' 3"  (1.6 m)   Physical Exam  Constitutional: She is oriented to person, place, and time. She appears well-developed and well-nourished.  HENT:  Head: Normocephalic and atraumatic.  Eyes: Pupils are equal, round, and reactive to light. EOM are normal.  Neck: Normal range of motion.  Cardiovascular: Normal rate, regular rhythm and normal heart sounds.  Pulmonary/Chest: Effort normal and breath sounds normal.  Abdominal: Soft. Bowel sounds are normal.  Neurological: She is alert and oriented to person, place, and time.  Skin: Skin is warm and dry.     CMP Latest Ref Rng & Units 04/26/2018  Glucose 65 - 99 mg/dL 99  BUN 8 - 27 mg/dL 16  Creatinine 0.57 - 1.00 mg/dL 0.57  Sodium 134 - 144 mmol/L 138  Potassium 3.5 - 5.2 mmol/L 4.0  Chloride 96 - 106 mmol/L 102  CO2 20 - 29 mmol/L 20  Calcium 8.7 - 10.3 mg/dL 9.4  Total Protein 6.0 - 8.5 g/dL 6.3  Total Bilirubin 0.0 - 1.2 mg/dL 0.2  Alkaline Phos 39 - 117 IU/L 64  AST 0 - 40 IU/L 21  ALT 0 - 32 IU/L 17   CBC Latest Ref Rng & Units 06/29/2018  WBC 4.0 - 10.5 K/uL 6.1  Hemoglobin 12.0 - 15.0 g/dL 12.0  Hematocrit  36.0 - 46.0 % 39.0  Platelets 150 - 400 K/uL 264     Assessment and plan- Patient is a 72 y.o. female with iron deficiency anemia of unclear etiology  Patient received 2 doses of Feraheme in October 2019 and her iron studies have now normalized.  She was not anemic to begin with and therefore her hemoglobin continues to remain stable at this time.  She does not require IV iron today.  Repeat CBC ferritin and iron studies in 3 in 6 months and I will see her back in 6 months.  Patient continues to have abdominal pain which she says is particularly worse over the last 1 month.  Have  encouraged her to speak to her primary care doctor and [name] about this.  Of note she did have a CT abdomen May 2019 which did not reveal any evidence of malignancy   Visit Diagnosis 1. Iron deficiency anemia, unspecified iron deficiency anemia type      Dr. Randa Evens, MD, MPH [facility] [phone] 07/01/2018 1:23 PM

## 2018-07-04 DIAGNOSIS — M25551 Pain in right hip: Secondary | ICD-10-CM | POA: Diagnosis not present

## 2018-07-04 DIAGNOSIS — M25552 Pain in left hip: Secondary | ICD-10-CM | POA: Diagnosis not present

## 2018-07-05 ENCOUNTER — Other Ambulatory Visit: Payer: Self-pay

## 2018-07-05 MED ORDER — FENOFIBRATE 160 MG PO TABS: ORAL_TABLET | ORAL | 5 refills | Status: DC

## 2018-07-11 ENCOUNTER — Ambulatory Visit (INDEPENDENT_AMBULATORY_CARE_PROVIDER_SITE_OTHER): Payer: Medicare Other

## 2018-07-11 DIAGNOSIS — J301 Allergic rhinitis due to pollen: Secondary | ICD-10-CM

## 2018-07-11 DIAGNOSIS — M25552 Pain in left hip: Secondary | ICD-10-CM | POA: Diagnosis not present

## 2018-07-11 DIAGNOSIS — M25551 Pain in right hip: Secondary | ICD-10-CM | POA: Diagnosis not present

## 2018-07-14 DIAGNOSIS — M25551 Pain in right hip: Secondary | ICD-10-CM | POA: Diagnosis not present

## 2018-07-14 DIAGNOSIS — M25552 Pain in left hip: Secondary | ICD-10-CM | POA: Diagnosis not present

## 2018-07-18 DIAGNOSIS — H35373 Puckering of macula, bilateral: Secondary | ICD-10-CM | POA: Diagnosis not present

## 2018-07-18 DIAGNOSIS — H43823 Vitreomacular adhesion, bilateral: Secondary | ICD-10-CM | POA: Diagnosis not present

## 2018-07-19 DIAGNOSIS — M25552 Pain in left hip: Secondary | ICD-10-CM | POA: Diagnosis not present

## 2018-07-19 DIAGNOSIS — M25551 Pain in right hip: Secondary | ICD-10-CM | POA: Diagnosis not present

## 2018-07-22 DIAGNOSIS — M25551 Pain in right hip: Secondary | ICD-10-CM | POA: Diagnosis not present

## 2018-07-22 DIAGNOSIS — M25552 Pain in left hip: Secondary | ICD-10-CM | POA: Diagnosis not present

## 2018-07-25 ENCOUNTER — Ambulatory Visit (INDEPENDENT_AMBULATORY_CARE_PROVIDER_SITE_OTHER): Payer: Medicare Other

## 2018-07-25 DIAGNOSIS — M25551 Pain in right hip: Secondary | ICD-10-CM | POA: Diagnosis not present

## 2018-07-25 DIAGNOSIS — J301 Allergic rhinitis due to pollen: Secondary | ICD-10-CM | POA: Diagnosis not present

## 2018-07-25 DIAGNOSIS — Z96611 Presence of right artificial shoulder joint: Secondary | ICD-10-CM | POA: Diagnosis not present

## 2018-07-25 DIAGNOSIS — M25552 Pain in left hip: Secondary | ICD-10-CM | POA: Diagnosis not present

## 2018-07-28 DIAGNOSIS — M25552 Pain in left hip: Secondary | ICD-10-CM | POA: Diagnosis not present

## 2018-07-28 DIAGNOSIS — M25551 Pain in right hip: Secondary | ICD-10-CM | POA: Diagnosis not present

## 2018-08-08 ENCOUNTER — Telehealth: Payer: Self-pay

## 2018-08-08 ENCOUNTER — Other Ambulatory Visit: Payer: Self-pay | Admitting: Nurse Practitioner

## 2018-08-08 ENCOUNTER — Ambulatory Visit: Payer: Self-pay

## 2018-08-08 DIAGNOSIS — Z20828 Contact with and (suspected) exposure to other viral communicable diseases: Secondary | ICD-10-CM

## 2018-08-08 DIAGNOSIS — M25552 Pain in left hip: Secondary | ICD-10-CM | POA: Diagnosis not present

## 2018-08-08 DIAGNOSIS — M25551 Pain in right hip: Secondary | ICD-10-CM | POA: Diagnosis not present

## 2018-08-08 MED ORDER — BALOXAVIR MARBOXIL(40 MG DOSE) 2 X 20 MG PO TBPK: 2.0000 | ORAL_TABLET | Freq: Once | ORAL | 0 refills | Status: AC

## 2018-08-08 NOTE — Progress Notes (Signed)
Sent xofluza 20mg  - take 2 tablets once for flu. Sent to [facility].

## 2018-08-08 NOTE — Telephone Encounter (Signed)
PT ADVISED WE SEND XOFLUZA TO [facility]

## 2018-08-08 NOTE — Telephone Encounter (Signed)
Sent xofluza 20mg  - take 2 tablets once for flu. Sent to [facility].

## 2018-08-11 ENCOUNTER — Encounter: Payer: Self-pay | Admitting: Nurse Practitioner

## 2018-08-11 ENCOUNTER — Ambulatory Visit (INDEPENDENT_AMBULATORY_CARE_PROVIDER_SITE_OTHER): Payer: Medicare Other | Admitting: Nurse Practitioner

## 2018-08-11 VITALS — BP 138/64 | HR 89 | Temp 98.4°F | Resp 16 | Ht 61.0 in | Wt 129.2 lb

## 2018-08-11 DIAGNOSIS — J069 Acute upper respiratory infection, unspecified: Secondary | ICD-10-CM

## 2018-08-11 DIAGNOSIS — R05 Cough: Secondary | ICD-10-CM

## 2018-08-11 DIAGNOSIS — R059 Cough, unspecified: Secondary | ICD-10-CM

## 2018-08-11 DIAGNOSIS — J029 Acute pharyngitis, unspecified: Secondary | ICD-10-CM | POA: Insufficient documentation

## 2018-08-11 DIAGNOSIS — R509 Fever, unspecified: Secondary | ICD-10-CM

## 2018-08-11 DIAGNOSIS — B379 Candidiasis, unspecified: Secondary | ICD-10-CM

## 2018-08-11 DIAGNOSIS — F339 Major depressive disorder, recurrent, unspecified: Secondary | ICD-10-CM

## 2018-08-11 LAB — POCT RAPID STREP A (OFFICE): RAPID STREP A SCREEN: NEGATIVE

## 2018-08-11 LAB — POC INFLUENZA TEST: NEGATIVE: NEGATIVE

## 2018-08-11 MED ORDER — BENZONATATE 100 MG PO CAPS: 200.0000 mg | ORAL_CAPSULE | Freq: Two times a day (BID) | ORAL | 3 refills | Status: DC | PRN

## 2018-08-11 MED ORDER — OSELTAMIVIR PHOSPHATE 75 MG PO CAPS: 75.0000 mg | ORAL_CAPSULE | Freq: Two times a day (BID) | ORAL | 0 refills | Status: DC

## 2018-08-11 MED ORDER — FIRST-DUKES MOUTHWASH MT SUSP: OROMUCOSAL | 0 refills | Status: DC

## 2018-08-11 MED ORDER — AMOXICILLIN-POT CLAVULANATE 875-125 MG PO TABS: 1.0000 | ORAL_TABLET | Freq: Two times a day (BID) | ORAL | 0 refills | Status: DC

## 2018-08-11 MED ORDER — CITALOPRAM HYDROBROMIDE 10 MG PO TABS: 10.0000 mg | ORAL_TABLET | Freq: Every day | ORAL | 5 refills | Status: DC

## 2018-08-11 MED ORDER — FLUCONAZOLE 150 MG PO TABS: ORAL_TABLET | ORAL | 0 refills | Status: DC

## 2018-08-11 NOTE — Progress Notes (Signed)
[facility] [place], [place] [address]  Internal MEDICINE  Office Visit Note  Patient Name: Vanessa Evans  [id number]  [id number]  Date of Service: 08/11/2018   Pt is here for a sick visit.   Chief Complaint  Patient presents with  . Sore Throat    pus pocket on tonsil,  . Cough  . Chills  . Fever    started tuesday,, took extra strength tyleonl, horseness, foul taste in mouth, pt stated her husband has strep B flu      The patient is here for sick visit. Today, she has sore throat, sinus congestion and pressure. She has had fever. Husband was recently positive for flu B. Was treated with xofluza. Had to go back today because symptoms were worse. Negative for pneumonia, but positive for sinus infection. Now on antibiotics. The patient was treated with xofluza when her husband was diagnosed with flu. Since then, her symptoms have worsened. thorat is very sore and she states it looks like she has pus pocket in the back of her throat on the right side. She denies nausea and vomiting.        Current Medication:  Outpatient Encounter Medications as of 08/11/2018  Medication Sig  . acetaminophen (TYLENOL) 500 MG tablet Take 1,000 mg by mouth 2 (two) times daily as needed for moderate pain.  Marland Kitchen acetaminophen (TYLENOL) 650 MG CR tablet Take 1,300 mg by mouth at bedtime as needed for pain.  Marland Kitchen aspirin EC 81 MG tablet Take 81 mg by mouth at bedtime.   . Calcium Carbonate-Vitamin D (CALCIUM 600+D) 600-400 MG-UNIT tablet Take 1 tablet by mouth 2 (two) times daily.  . cholecalciferol (VITAMIN D) 1000 UNITS tablet Take 1,000 Units by mouth at bedtime.   . citalopram (CELEXA) 10 MG tablet Take 1 tablet (10 mg total) by mouth at bedtime.  . cyanocobalamin 500 MCG tablet Take 500 mcg by mouth daily.  Marland Kitchen diltiazem (CARDIZEM CD) 120 MG 24 hr capsule Take 120 mg by mouth at bedtime.   . diphenhydrAMINE (BENADRYL) 25 MG tablet Take 25-50 mg by mouth daily as needed for  itching.  . docusate sodium (COLACE) 100 MG capsule Take 200 mg by mouth at bedtime as needed for moderate constipation.   . fenofibrate 160 MG tablet take1 tab po daily for chol  . folic acid (FOLVITE) 295 MCG tablet Take 400 mcg by mouth daily.  Marland Kitchen guaiFENesin (MUCINEX) 600 MG 12 hr tablet Take 600 mg by mouth at bedtime as needed (congestion).  . Hypromellose (ARTIFICIAL TEARS OP) Place 1-2 drops into both eyes daily as needed (dry eyes).  Marland Kitchen ketotifen (ZADITOR) 0.025 % ophthalmic solution Place 1 drop into both eyes 2 (two) times daily as needed (allergies).  Marland Kitchen levothyroxine (SYNTHROID, LEVOTHROID) 50 MCG tablet Take 50 mcg by mouth daily before breakfast. Brand Name only  . Magnesium 500 MG TABS Take 100 mg by mouth at bedtime.   . Melatonin 5 MG TABS Take 1 tablet by mouth at bedtime. For sleep   . methocarbamol (ROBAXIN) 500 MG tablet Take 1 tablet (500 mg total) by mouth every 6 (six) hours as needed for muscle spasms.  . Multiple Vitamins-Minerals (MULTIVITAMIN WITH MINERALS) tablet Take 1 tablet by mouth daily.  Marland Kitchen omeprazole (PRILOSEC) 40 MG capsule Take 40 mg by mouth 2 (two) times daily.   . pantoprazole (PROTONIX) 40 MG tablet Take 40 mg by mouth 2 (two) times daily.  Marland Kitchen Phenylephrine-guaiFENesin 10-400 MG TABS Take  1 tablet by mouth every 4 (four) hours as needed (congestion).  . pyridoxine (B-6) 100 MG tablet Take 100 mg by mouth daily.  . sodium chloride (OCEAN) 0.65 % SOLN nasal spray Place 1 spray into both nostrils 2 (two) times daily.  . sucralfate (CARAFATE) 1 g tablet Take 1 g by mouth 3 (three) times daily between meals.  . zinc gluconate 50 MG tablet Take 50 mg by mouth daily.  . [DISCONTINUED] citalopram (CELEXA) 10 MG tablet Take 1 tablet (10 mg total) by mouth at bedtime.  Marland Kitchen albuterol (PROVENTIL) (2.5 MG/3ML) 0.083% nebulizer solution Take 3 mLs (2.5 mg total) by nebulization every 6 (six) hours as needed for wheezing or shortness of breath. (Patient not taking: Reported  on 07/01/2018)  . albuterol (VENTOLIN HFA) 108 (90 Base) MCG/ACT inhaler Inhale 2 puffs into the lungs every 4 (four) hours as needed for wheezing or shortness of breath. (Patient not taking: Reported on 07/01/2018)  . amoxicillin-clavulanate (AUGMENTIN) 875-125 MG tablet Take 1 tablet by mouth 2 (two) times daily.  . benzonatate (TESSALON) 100 MG capsule Take 2 capsules (200 mg total) by mouth 2 (two) times daily as needed for cough.  . cyclobenzaprine (FLEXERIL) 10 MG tablet Take 0.5 tablets (5 mg total) by mouth 2 (two) times daily as needed for muscle spasms. (Patient not taking: Reported on 04/18/2018)  . diazepam (VALIUM) 5 MG tablet Take 1 tablet po 1.5 hours pror to procedure. May repeat dose in 1 hour if needed. (Patient not taking: Reported on 05/05/2018)  . diclofenac sodium (VOLTAREN) 1 % GEL Apply 4 g topically 4 (four) times daily as needed (pain). (Patient not taking: Reported on 07/01/2018)  . Diphenhyd-Hydrocort-Nystatin (FIRST-DUKES MOUTHWASH) SUSP Swish and swallow 57mls QID prn sore throat.  Marland Kitchen EPIPEN 2-PAK 0.3 MG/0.3ML SOAJ injection use as directed for severe allergy reaction (Patient not taking: Reported on 07/01/2018)  . estradiol (ESTRACE VAGINAL) 0.1 MG/GM vaginal cream Apply a pea-sized amount to urethra at bedtime (Patient not taking: Reported on 07/01/2018)  . fluconazole (DIFLUCAN) 150 MG tablet Take 1 tablet po once. May repeat dose in 3 days as needed for persistent symptoms.  Marland Kitchen HYDROcodone-acetaminophen (NORCO) 7.5-325 MG tablet Take 1-2 tablets by mouth every 4 (four) hours as needed for severe pain (pain score 7-10). (Patient not taking: Reported on 04/18/2018)  . levocetirizine (XYZAL) 5 MG tablet Take 1 tablet (5 mg total) by mouth daily as needed. (Patient not taking: Reported on 07/01/2018)  . oseltamivir (TAMIFLU) 75 MG capsule Take 1 capsule (75 mg total) by mouth 2 (two) times daily.  . phenazopyridine (PYRIDIUM) 200 MG tablet Take 1 tablet (200 mg total) by mouth 3  (three) times daily as needed for pain. (Patient not taking: Reported on 04/18/2018)  . predniSONE (STERAPRED UNI-PAK 48 TAB) 10 MG (48) TBPK tablet 12 day dose pack. Take by mouth as directed for 12 days (Patient not taking: Reported on 01/24/2018)  . [DISCONTINUED] amoxicillin-clavulanate (AUGMENTIN) 875-125 MG tablet Take 1 tablet by mouth 2 (two) times daily. (Patient not taking: Reported on 04/18/2018)  . [DISCONTINUED] benzonatate (TESSALON) 100 MG capsule Take 2 capsules (200 mg total) by mouth 2 (two) times daily as needed for cough. (Patient not taking: Reported on 07/01/2018)   No facility-administered encounter medications on file as of 08/11/2018.       Medical History: Past Medical History:  Diagnosis Date  . Arthritis    osteo  . Asthma    needs rescue inhaler few times a year  .  Depression   . Dizziness    light headed spells but it has been a while  . Dysrhythmia    tachycardia.(cardizem). brady during procedures  . GERD (gastroesophageal reflux disease)    gastritis  . Headache(784.0)   . Heart murmur 2019   aortic. dr. Nehemiah Massed not worried about this  . Hyperlipidemia   . Hypertension   . Hypothyroidism   . Migraine   . Orthopnea   . Pneumonia    long time ago (greater than 5 years ago)  . PONV (postoperative nausea and vomiting)    very anxious during cataract surgery. brady during colonoscopy  . Shortness of breath    any exertion, cannot lie flat     Today's Vitals   08/11/18 1514  BP: 138/64  Pulse: 89  Resp: 16  Temp: 98.4 F (36.9 C)  SpO2: 96%  Weight: 129 lb 3.2 oz (58.6 kg)  Height: 5\' 1"  (1.549 m)   Review of Systems  Constitutional: Positive for chills and fatigue. Negative for fever.  HENT: Positive for congestion, ear pain, postnasal drip, rhinorrhea, sinus pain, sore throat and voice change.   Respiratory: Positive for cough. Negative for wheezing.   Cardiovascular: Negative for chest pain and palpitations.  Gastrointestinal: Negative.    Endocrine: Negative.   Musculoskeletal: Positive for myalgias.  Skin: Negative.   Allergic/Immunologic: Positive for environmental allergies.  Neurological: Positive for headaches.  Hematological: Positive for adenopathy.  Psychiatric/Behavioral: Negative.     Physical Exam Vitals signs and nursing note reviewed.  Constitutional:      General: She is not in acute distress.    Appearance: She is well-developed. She is ill-appearing. She is not diaphoretic.  HENT:     Head: Normocephalic and atraumatic.     Right Ear: Tympanic membrane is erythematous and bulging.     Left Ear: Tympanic membrane is erythematous and bulging.     Nose: Congestion present.     Right Sinus: Maxillary sinus tenderness and frontal sinus tenderness present.     Left Sinus: Maxillary sinus tenderness and frontal sinus tenderness present.     Mouth/Throat:     Pharynx: Oropharyngeal exudate and posterior oropharyngeal erythema present.     Tonsils: Tonsillar exudate present. Swelling: 2+ on the right.  Eyes:     Pupils: Pupils are equal, round, and reactive to light.  Neck:     Musculoskeletal: Normal range of motion and neck supple.     Thyroid: No thyromegaly.     Vascular: No JVD.     Trachea: No tracheal deviation.  Cardiovascular:     Rate and Rhythm: Normal rate and regular rhythm.     Heart sounds: Normal heart sounds. No murmur. No friction rub. No gallop.   Pulmonary:     Effort: Pulmonary effort is normal. No respiratory distress.     Breath sounds: Normal breath sounds. No wheezing or rales.  Chest:     Chest wall: No tenderness.  Abdominal:     General: Bowel sounds are normal.     Palpations: Abdomen is soft.  Musculoskeletal: Normal range of motion.  Lymphadenopathy:     Cervical: No cervical adenopathy.  Skin:    General: Skin is warm and dry.  Neurological:     General: No focal deficit present.     Mental Status: She is alert and oriented to person, place, and time.      Cranial Nerves: No cranial nerve deficit.  Psychiatric:  Behavior: Behavior normal.        Thought Content: Thought content normal.        Judgment: Judgment normal.   Assessment/Plan: 1. Chills with fever - POC INFLUENZA TEST negative however, patient's husband positive for flu B. Treat with tamiflu 75mg  twice daily for 5 days.  - oseltamivir (TAMIFLU) 75 MG capsule; Take 1 capsule (75 mg total) by mouth 2 (two) times daily.  Dispense: 10 capsule; Refill: 0  2. Sore throat - POCT rapid strep A negative. rx for [facility] magic mouthwash. Swish and swallow 62mls up to four times daily as needed for sore throat.  - Diphenhyd-Hydrocort-Nystatin (FIRST-DUKES MOUTHWASH) SUSP; Swish and swallow 68mls QID prn sore throat.  Dispense: 250 mL; Refill: 0  3. Cough Tessalon perls. Use up to three times daily as needed for cough  - benzonatate (TESSALON) 100 MG capsule; Take 2 capsules (200 mg total) by mouth 2 (two) times daily as needed for cough.  Dispense: 60 capsule; Refill: 3  4. Acute upper respiratory infection Start augmentin 875mg  twice daily for 10 days. Rest and increase fluids. May take OTC medication to improve symptoms.  - amoxicillin-clavulanate (AUGMENTIN) 875-125 MG tablet; Take 1 tablet by mouth 2 (two) times daily.  Dispense: 20 tablet; Refill: 0 - oseltamivir (TAMIFLU) 75 MG capsule; Take 1 capsule (75 mg total) by mouth 2 (two) times daily.  Dispense: 10 capsule; Refill: 0  5. Candidiasis - fluconazole (DIFLUCAN) 150 MG tablet; Take 1 tablet po once. May repeat dose in 3 days as needed for persistent symptoms.  Dispense: 3 tablet; Refill: 0  6. Episode of recurrent major depressive disorder, unspecified depression episode severity (HCC) Refill citalopram. Continue daily.  - citalopram (CELEXA) 10 MG tablet; Take 1 tablet (10 mg total) by mouth at bedtime.  Dispense: 30 tablet; Refill: 5  General Counseling: Rande verbalizes understanding of the findings of todays visit and  agrees with plan of treatment. I have discussed any further diagnostic evaluation that may be needed or ordered today. We also reviewed her medications today. she has been encouraged to call the office with any questions or concerns that should arise related to todays visit.    Counseling:  Rest and increase fluids. Continue using OTC medication to control symptoms.   This patient was seen by Leretha Pol FNP Collaboration with Dr Lavera Guise as a part of collaborative care agreement  Orders Placed This Encounter  Procedures  . POC INFLUENZA TEST  . POCT rapid strep A    Meds ordered this encounter  Medications  . amoxicillin-clavulanate (AUGMENTIN) 875-125 MG tablet    Sig: Take 1 tablet by mouth 2 (two) times daily.    Dispense:  20 tablet    Refill:  0    Order Specific Question:   Supervising Provider    Answer:   Lavera Guise [[id number]]  . Diphenhyd-Hydrocort-Nystatin (FIRST-DUKES MOUTHWASH) SUSP    Sig: Swish and swallow 33mls QID prn sore throat.    Dispense:  250 mL    Refill:  0    Please use compound which contains voscous lidocaine.    Order Specific Question:   Supervising Provider    Answer:   Lavera Guise [[id number]]  . benzonatate (TESSALON) 100 MG capsule    Sig: Take 2 capsules (200 mg total) by mouth 2 (two) times daily as needed for cough.    Dispense:  60 capsule    Refill:  3    Order Specific Question:  Supervising Provider    Answer:   Lavera Guise [[id number]]  . citalopram (CELEXA) 10 MG tablet    Sig: Take 1 tablet (10 mg total) by mouth at bedtime.    Dispense:  30 tablet    Refill:  5    Order Specific Question:   Supervising Provider    Answer:   Lavera Guise [[id number]]  . fluconazole (DIFLUCAN) 150 MG tablet    Sig: Take 1 tablet po once. May repeat dose in 3 days as needed for persistent symptoms.    Dispense:  3 tablet    Refill:  0    Order Specific Question:   Supervising Provider    Answer:   Lavera Guise [[id number]]  . oseltamivir (TAMIFLU) 75 MG  capsule    Sig: Take 1 capsule (75 mg total) by mouth 2 (two) times daily.    Dispense:  10 capsule    Refill:  0    Order Specific Question:   Supervising Provider    Answer:   Lavera Guise [[id number]]    Time spent: 25 Minutes

## 2018-08-12 ENCOUNTER — Other Ambulatory Visit: Payer: Self-pay

## 2018-08-12 ENCOUNTER — Other Ambulatory Visit: Payer: Self-pay | Admitting: Internal Medicine

## 2018-08-12 DIAGNOSIS — R062 Wheezing: Secondary | ICD-10-CM

## 2018-08-12 MED ORDER — ALBUTEROL SULFATE (2.5 MG/3ML) 0.083% IN NEBU: 2.5000 mg | INHALATION_SOLUTION | Freq: Four times a day (QID) | RESPIRATORY_TRACT | 0 refills | Status: DC | PRN

## 2018-08-12 NOTE — Telephone Encounter (Signed)
Spoke with pt she was taking neb soln as need spoke with phar gave albuterol 0.83% and canceled albuterol 0.63

## 2018-08-19 DIAGNOSIS — M25551 Pain in right hip: Secondary | ICD-10-CM | POA: Diagnosis not present

## 2018-08-19 DIAGNOSIS — M25552 Pain in left hip: Secondary | ICD-10-CM | POA: Diagnosis not present

## 2018-08-22 ENCOUNTER — Encounter: Payer: Self-pay | Admitting: Nurse Practitioner

## 2018-08-22 ENCOUNTER — Ambulatory Visit (INDEPENDENT_AMBULATORY_CARE_PROVIDER_SITE_OTHER): Payer: Medicare Other | Admitting: Nurse Practitioner

## 2018-08-22 ENCOUNTER — Ambulatory Visit (INDEPENDENT_AMBULATORY_CARE_PROVIDER_SITE_OTHER): Payer: Medicare Other

## 2018-08-22 VITALS — BP 134/68 | HR 77 | Resp 16 | Ht 61.0 in | Wt 130.6 lb

## 2018-08-22 DIAGNOSIS — J4520 Mild intermittent asthma, uncomplicated: Secondary | ICD-10-CM | POA: Diagnosis not present

## 2018-08-22 DIAGNOSIS — I10 Essential (primary) hypertension: Secondary | ICD-10-CM

## 2018-08-22 DIAGNOSIS — J301 Allergic rhinitis due to pollen: Secondary | ICD-10-CM

## 2018-08-22 NOTE — Progress Notes (Signed)
[facility] [place], [place] [address]  Internal MEDICINE  Office Visit Note  Patient Name: Vanessa Evans  [id number]  [id number]  Date of Service: 08/22/2018  Chief Complaint  Patient presents with  . Medical Management of Chronic Issues    4 month routine follow up  . Quality Metric Gaps    new mammogram order  . Pain    painful bump on side of tongue  . Alopecia    pt is having hair loss    The patient is here for routine follow up exam. She was recently treated for URI and possible flu. She is feeling much better. Still having some bronchospasms, but these are getting better. She has noted a white bump on the side of her tongue. Initially noted it when she was sick. Has not changed since she first noted it. Denies pain associated with the bump on her tongue.  She needs to have mammogram done. Was ordered 01/2018, but she has not had the opportunity to get this completed.       Current Medication: Outpatient Encounter Medications as of 08/22/2018  Medication Sig  . acetaminophen (TYLENOL) 500 MG tablet Take 1,000 mg by mouth 2 (two) times daily as needed for moderate pain.  Marland Kitchen acetaminophen (TYLENOL) 650 MG CR tablet Take 1,300 mg by mouth at bedtime as needed for pain.  Marland Kitchen albuterol (PROVENTIL) (2.5 MG/3ML) 0.083% nebulizer solution Take 3 mLs (2.5 mg total) by nebulization every 6 (six) hours as needed for wheezing or shortness of breath.  Marland Kitchen aspirin EC 81 MG tablet Take 81 mg by mouth at bedtime.   . benzonatate (TESSALON) 100 MG capsule Take 2 capsules (200 mg total) by mouth 2 (two) times daily as needed for cough.  . Calcium Carbonate-Vitamin D (CALCIUM 600+D) 600-400 MG-UNIT tablet Take 1 tablet by mouth 2 (two) times daily.  . cholecalciferol (VITAMIN D) 1000 UNITS tablet Take 1,000 Units by mouth at bedtime.   . citalopram (CELEXA) 10 MG tablet Take 1 tablet (10 mg total) by mouth at bedtime.  . cyanocobalamin 500 MCG tablet Take 500 mcg by  mouth daily.  Marland Kitchen diltiazem (CARDIZEM CD) 120 MG 24 hr capsule Take 120 mg by mouth at bedtime.   . Diphenhyd-Hydrocort-Nystatin (FIRST-DUKES MOUTHWASH) SUSP Swish and swallow 49mls QID prn sore throat.  . diphenhydrAMINE (BENADRYL) 25 MG tablet Take 25-50 mg by mouth daily as needed for itching.  . docusate sodium (COLACE) 100 MG capsule Take 200 mg by mouth at bedtime as needed for moderate constipation.   . fenofibrate 160 MG tablet take1 tab po daily for chol  . fluconazole (DIFLUCAN) 150 MG tablet Take 1 tablet po once. May repeat dose in 3 days as needed for persistent symptoms.  . folic acid (FOLVITE) 536 MCG tablet Take 400 mcg by mouth daily.  Marland Kitchen guaiFENesin (MUCINEX) 600 MG 12 hr tablet Take 600 mg by mouth at bedtime as needed (congestion).  . Hypromellose (ARTIFICIAL TEARS OP) Place 1-2 drops into both eyes daily as needed (dry eyes).  Marland Kitchen ketotifen (ZADITOR) 0.025 % ophthalmic solution Place 1 drop into both eyes 2 (two) times daily as needed (allergies).  Marland Kitchen levothyroxine (SYNTHROID, LEVOTHROID) 50 MCG tablet Take 50 mcg by mouth daily before breakfast. Brand Name only  . Magnesium 500 MG TABS Take 100 mg by mouth at bedtime.   . Melatonin 5 MG TABS Take 1 tablet by mouth at bedtime. For sleep   . methocarbamol (ROBAXIN) 500 MG  tablet Take 1 tablet (500 mg total) by mouth every 6 (six) hours as needed for muscle spasms.  . Multiple Vitamins-Minerals (MULTIVITAMIN WITH MINERALS) tablet Take 1 tablet by mouth daily.  Marland Kitchen omeprazole (PRILOSEC) 40 MG capsule Take 40 mg by mouth 2 (two) times daily.   Marland Kitchen oseltamivir (TAMIFLU) 75 MG capsule Take 1 capsule (75 mg total) by mouth 2 (two) times daily.  . pantoprazole (PROTONIX) 40 MG tablet Take 40 mg by mouth 2 (two) times daily.  Marland Kitchen Phenylephrine-guaiFENesin 10-400 MG TABS Take 1 tablet by mouth every 4 (four) hours as needed (congestion).  . pyridoxine (B-6) 100 MG tablet Take 100 mg by mouth daily.  . sodium chloride (OCEAN) 0.65 % SOLN nasal  spray Place 1 spray into both nostrils 2 (two) times daily.  . sucralfate (CARAFATE) 1 g tablet Take 1 g by mouth 3 (three) times daily between meals.  . zinc gluconate 50 MG tablet Take 50 mg by mouth daily.  Marland Kitchen albuterol (VENTOLIN HFA) 108 (90 Base) MCG/ACT inhaler Inhale 2 puffs into the lungs every 4 (four) hours as needed for wheezing or shortness of breath. (Patient not taking: Reported on 07/01/2018)  . amoxicillin-clavulanate (AUGMENTIN) 875-125 MG tablet Take 1 tablet by mouth 2 (two) times daily. (Patient not taking: Reported on 08/22/2018)  . cyclobenzaprine (FLEXERIL) 10 MG tablet Take 0.5 tablets (5 mg total) by mouth 2 (two) times daily as needed for muscle spasms. (Patient not taking: Reported on 04/18/2018)  . diazepam (VALIUM) 5 MG tablet Take 1 tablet po 1.5 hours pror to procedure. May repeat dose in 1 hour if needed. (Patient not taking: Reported on 05/05/2018)  . diclofenac sodium (VOLTAREN) 1 % GEL Apply 4 g topically 4 (four) times daily as needed (pain). (Patient not taking: Reported on 07/01/2018)  . EPIPEN 2-PAK 0.3 MG/0.3ML SOAJ injection use as directed for severe allergy reaction (Patient not taking: Reported on 07/01/2018)  . estradiol (ESTRACE VAGINAL) 0.1 MG/GM vaginal cream Apply a pea-sized amount to urethra at bedtime (Patient not taking: Reported on 07/01/2018)  . HYDROcodone-acetaminophen (NORCO) 7.5-325 MG tablet Take 1-2 tablets by mouth every 4 (four) hours as needed for severe pain (pain score 7-10). (Patient not taking: Reported on 04/18/2018)  . levocetirizine (XYZAL) 5 MG tablet Take 1 tablet (5 mg total) by mouth daily as needed. (Patient not taking: Reported on 07/01/2018)  . phenazopyridine (PYRIDIUM) 200 MG tablet Take 1 tablet (200 mg total) by mouth 3 (three) times daily as needed for pain. (Patient not taking: Reported on 04/18/2018)  . predniSONE (STERAPRED UNI-PAK 48 TAB) 10 MG (48) TBPK tablet 12 day dose pack. Take by mouth as directed for 12 days (Patient  not taking: Reported on 01/24/2018)   No facility-administered encounter medications on file as of 08/22/2018.     Surgical History: Past Surgical History:  Procedure Laterality Date  . 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 BIOPSY THYROID  2018  . bladder biopsies  2003   9 biopsies done by dr. cope.  all benign.  inflammatory process going on in bladder  . BREAST BIOPSY Right    benign  . BREAST SURGERY Right    lumpectomy  . CATARACT EXTRACTION W/PHACO Left 12/25/2014   Procedure: CATARACT EXTRACTION PHACO AND INTRAOCULAR LENS PLACEMENT (IOC);  Surgeon: Birder Robson, MD;  Location: [facility] ORS;  Service: Ophthalmology;  Laterality: Left;  [place] 00:32 AP% 20.0 CDE 6.49  .  COLONOSCOPY W/ BIOPSIES    . COLONOSCOPY W/ POLYPECTOMY  2002, 2004, 2005   adenomatous polyps removed  . COLONOSCOPY WITH PROPOFOL N/A 08/23/2017   Procedure: COLONOSCOPY WITH PROPOFOL;  Surgeon: Manya Silvas, MD;  Location: [facility] ENDOSCOPY;  Service: Endoscopy;  Laterality: N/A;  . COLONOSCOPY WITH PROPOFOL N/A 11/08/2017   Procedure: COLONOSCOPY WITH PROPOFOL;  Surgeon: Manya Silvas, MD;  Location: [facility] ENDOSCOPY;  Service: Endoscopy;  Laterality: N/A;  . CYSTOCELE REPAIR N/A 04/09/2016   Procedure: ANTERIOR REPAIR (CYSTOCELE);  Surgeon: Gae Dry, MD;  Location: [facility] ORS;  Service: Gynecology;  Laterality: N/A;  . DIAGNOSTIC LAPAROSCOPY  2008   removed both ovaries with cysts, tubes and fibroids  . DILATION AND CURETTAGE OF UTERUS  1973  . ESOPHAGOGASTRODUODENOSCOPY (EGD) WITH PROPOFOL N/A 08/23/2017   Procedure: ESOPHAGOGASTRODUODENOSCOPY (EGD) WITH PROPOFOL;  Surgeon: Manya Silvas, MD;  Location: [facility]COPY;  Service: Endoscopy;  Laterality: N/A;  . EYE SURGERY Bilateral 2016  . HERNIA REPAIR Right 2008   Inguinal hernia Repair, ventral hernia repair  . JOINT REPLACEMENT Right 2008   knee  . KNEE ARTHROSCOPY Right   . LUMBAR  LAMINECTOMY/DECOMPRESSION MICRODISCECTOMY Right 03/21/2013   Procedure: Right Lumbar five-Sacral one Laminectomy for Synovial Cyst;  Surgeon: Faythe Ghee, MD;  Location: [facility] NEURO ORS;  Service: Neurosurgery;  Laterality: Right;  right  . OOPHORECTOMY    . REVERSE SHOULDER ARTHROPLASTY Right 02/22/2018   Procedure: REVERSE SHOULDER ARTHROPLASTY;  Surgeon: Corky Mull, MD;  Location: [facility] ORS;  Service: Orthopedics;  Laterality: Right;  . TUBAL LIGATION    . UNILATERAL SALPINGECTOMY Left 04/09/2016   Procedure: UNILATERAL SALPINGECTOMY;  Surgeon: Gae Dry, MD;  Location: [facility] ORS;  Service: Gynecology;  Laterality: Left;  . UPPER GI ENDOSCOPY  2010   with biopsy of gastric erosion  . VAGINAL HYSTERECTOMY N/A 04/09/2016   Procedure: HYSTERECTOMY VAGINAL;  Surgeon: Gae Dry, MD;  Location: [facility] ORS;  Service: Gynecology;  Laterality: N/A;  . VAGINAL HYSTERECTOMY  2017   and bladder tack    Medical History: Past Medical History:  Diagnosis Date  . Arthritis    osteo  . Asthma    needs rescue inhaler few times a year  . Depression   . Dizziness    light headed spells but it has been a while  . Dysrhythmia    tachycardia.(cardizem). brady during procedures  . GERD (gastroesophageal reflux disease)    gastritis  . Headache(784.0)   . Heart murmur 2019   aortic. dr. Nehemiah Massed not worried about this  . Hyperlipidemia   . Hypertension   . Hypothyroidism   . Migraine   . Orthopnea   . Pneumonia    long time ago (greater than 5 years ago)  . PONV (postoperative nausea and vomiting)    very anxious during cataract surgery. brady during colonoscopy  . Shortness of breath    any exertion, cannot lie flat    Family History: Family History  Problem Relation Age of Onset  . Pulmonary fibrosis Mother   . Melanoma Father   . Cardiomyopathy Sister        and arrhythmia    Social History   Socioeconomic History  . Marital status: Married    Spouse name: Not on file   . Number of children: Not on file  . Years of education: Not on file  . Highest education level: Not on file  Occupational History  . Not on file  Social  Needs  . Financial resource strain: Not on file  . Food insecurity:    Worry: Not on file    Inability: Not on file  . Transportation needs:    Medical: Not on file    Non-medical: Not on file  Tobacco Use  . Smoking status: Former Smoker    Types: Cigarettes  . Smokeless tobacco: Never Used  Substance and Sexual Activity  . Alcohol use: Not Currently    Comment: occasional wine  . Drug use: No  . Sexual activity: Not Currently  Lifestyle  . Physical activity:    Days per week: Not on file    Minutes per session: Not on file  . Stress: Not on file  Relationships  . Social connections:    Talks on phone: Not on file    Gets together: Not on file    Attends religious service: Not on file    Active member of club or organization: Not on file    Attends meetings of clubs or organizations: Not on file    Relationship status: Not on file  . Intimate partner violence:    Fear of current or ex partner: Not on file    Emotionally abused: Not on file    Physically abused: Not on file    Forced sexual activity: Not on file  Other Topics Concern  . Not on file  Social History Narrative  . Not on file      Review of Systems  Constitutional: Negative for activity change, chills, fatigue and unexpected weight change.  HENT: Negative for congestion, postnasal drip, rhinorrhea, sneezing and sore throat.        Small, white bump noticed on the very right side of her tongue. Initially noted after she was seen at her most recent visit for URI. Denies pain associated with this lesion.   Respiratory: Positive for wheezing. Negative for cough, chest tightness and shortness of breath.        Wheezing is intermittent and improves when she uses her nebulizer.   Cardiovascular: Negative for chest pain and palpitations.   Gastrointestinal: Negative for abdominal pain, constipation, diarrhea, nausea and vomiting.  Endocrine: Negative for cold intolerance, heat intolerance, polydipsia and polyuria.  Musculoskeletal: Negative for arthralgias, back pain, joint swelling and neck pain.  Skin: Negative for rash.  Allergic/Immunologic: Positive for environmental allergies.  Neurological: Negative for dizziness, tremors, numbness and headaches.  Hematological: Negative for adenopathy. Does not bruise/bleed easily.  Psychiatric/Behavioral: Negative for behavioral problems (Depression), sleep disturbance and suicidal ideas. The patient is not nervous/anxious.     Today's Vitals   08/22/18 1047  BP: 134/68  Pulse: 77  Resp: 16  SpO2: 98%  Weight: 130 lb 9.6 oz (59.2 kg)  Height: 5\' 1"  (1.549 m)    Physical Exam Vitals signs and nursing note reviewed.  Constitutional:      General: She is not in acute distress.    Appearance: Normal appearance. She is well-developed. She is not diaphoretic.  HENT:     Head: Normocephalic and atraumatic.     Mouth/Throat:     Pharynx: No oropharyngeal exudate.  Eyes:     Pupils: Pupils are equal, round, and reactive to light.  Neck:     Musculoskeletal: Normal range of motion and neck supple.     Thyroid: No thyromegaly.     Vascular: No JVD.     Trachea: No tracheal deviation.  Cardiovascular:     Rate and Rhythm: Normal rate and regular  rhythm.     Heart sounds: Normal heart sounds. No murmur. No friction rub. No gallop.   Pulmonary:     Effort: Pulmonary effort is normal. No respiratory distress.     Breath sounds: Normal breath sounds. No wheezing or rales.  Chest:     Chest wall: No tenderness.  Abdominal:     General: Bowel sounds are normal.     Palpations: Abdomen is soft.  Musculoskeletal: Normal range of motion.  Lymphadenopathy:     Cervical: No cervical adenopathy.  Skin:    General: Skin is warm and dry.  Neurological:     Mental Status: She is  alert and oriented to person, place, and time.     Cranial Nerves: No cranial nerve deficit.  Psychiatric:        Behavior: Behavior normal.        Thought Content: Thought content normal.        Judgment: Judgment normal.    Assessment/Plan: 1. Mild intermittent asthma without complication Continue use of inhaler and nebulizer treatments as needed and as prescribed.   2. Seasonal allergic rhinitis due to pollen Continue all allergy treatments as prescribed and as scheduled.   3. Benign essential HTN Stable. Continue bp medications as prescribed .  General Counseling: Viera verbalizes understanding of the findings of todays visit and agrees with plan of treatment. I have discussed any further diagnostic evaluation that may be needed or ordered today. We also reviewed her medications today. she has been encouraged to call the office with any questions or concerns that should arise related to todays visit.   Hypertension Counseling:   The following hypertensive lifestyle modification were recommended and discussed:  1. Limiting alcohol intake to less than 1 oz/day of ethanol:(24 oz of beer or 8 oz of wine or 2 oz of 100-proof whiskey). 2. Take baby ASA 81 mg daily. 3. Importance of regular aerobic exercise and losing weight. 4. Reduce dietary saturated fat and cholesterol intake for overall cardiovascular health. 5. Maintaining adequate dietary potassium, calcium, and magnesium intake. 6. Regular monitoring of the blood pressure. 7. Reduce sodium intake to less than 100 mmol/day (less than 2.3 gm of sodium or less than 6 gm of sodium choride)   This patient was seen by [place] with Dr Lavera Guise as a part of collaborative care agreement   Time spent: 65 Minutes      Dr Lavera Guise Internal medicine

## 2018-08-23 ENCOUNTER — Other Ambulatory Visit: Payer: Self-pay | Admitting: Podiatry

## 2018-08-23 ENCOUNTER — Encounter: Payer: Self-pay | Admitting: Podiatry

## 2018-08-23 ENCOUNTER — Ambulatory Visit (INDEPENDENT_AMBULATORY_CARE_PROVIDER_SITE_OTHER): Payer: Medicare Other

## 2018-08-23 ENCOUNTER — Ambulatory Visit (INDEPENDENT_AMBULATORY_CARE_PROVIDER_SITE_OTHER): Payer: Medicare Other | Admitting: Podiatry

## 2018-08-23 DIAGNOSIS — M19071 Primary osteoarthritis, right ankle and foot: Secondary | ICD-10-CM

## 2018-08-23 DIAGNOSIS — M659 Synovitis and tenosynovitis, unspecified: Secondary | ICD-10-CM

## 2018-08-23 DIAGNOSIS — M76821 Posterior tibial tendinitis, right leg: Secondary | ICD-10-CM

## 2018-08-23 MED ORDER — PREDNISONE 10 MG (48) PO TBPK: ORAL_TABLET | ORAL | 0 refills | Status: DC

## 2018-08-24 DIAGNOSIS — M25552 Pain in left hip: Secondary | ICD-10-CM | POA: Diagnosis not present

## 2018-08-24 DIAGNOSIS — M25551 Pain in right hip: Secondary | ICD-10-CM | POA: Diagnosis not present

## 2018-08-25 NOTE — Progress Notes (Signed)
   Subjective:  73 year old female presenting today with a chief complaint of intermittent dull aching to the right ankle that began 2-3 months ago. She states the pain is progressively worsening. Walking increases the pain. She has been using an ankle brace and Voltaren gel with some relief. Patient is here for further evaluation and treatment.   Past Medical History:  Diagnosis Date  . Arthritis    osteo  . Asthma    needs rescue inhaler few times a year  . Depression   . Dizziness    light headed spells but it has been a while  . Dysrhythmia    tachycardia.(cardizem). brady during procedures  . GERD (gastroesophageal reflux disease)    gastritis  . Headache(784.0)   . Heart murmur 2019   aortic. dr. Nehemiah Massed not worried about this  . Hyperlipidemia   . Hypertension   . Hypothyroidism   . Migraine   . Orthopnea   . Pneumonia    long time ago (greater than 5 years ago)  . PONV (postoperative nausea and vomiting)    very anxious during cataract surgery. brady during colonoscopy  . Shortness of breath    any exertion, cannot lie flat    Objective / Physical Exam:  General:  The patient is alert and oriented x3 in no acute distress. Dermatology:  Skin is warm, dry and supple bilateral lower extremities. Negative for open lesions or macerations. Vascular:  Palpable pedal pulses bilaterally. No edema or erythema noted. Capillary refill within normal limits. Neurological:  Epicritic and protective threshold grossly intact bilaterally.  Musculoskeletal Exam:  Pain on palpation to the anterior lateral medial aspects of the patient's right ankle. Mild edema noted. Range of motion within normal limits to all pedal and ankle joints bilateral. Muscle strength 5/5 in all groups bilateral.   Radiographic Exam:  Degenerative changes with joint space narrowing noted to the right foot and ankle.  Assessment: 1. DJD right foot and ankle 2. Synovitis right ankle   Plan of Care:    1. Patient was evaluated. X-Rays reviewed.  2. injection of 0.5 mL Celestone Soluspan injected in the patient's right ankle. 3. Cannot take oral NSAIDs.  4. CAM boot dispensed. Weightbearing as tolerated as needed.  5. Prescription for Medrol Dose Pak provided to patient. 6. Return to clinic as needed.   Edrick Kins, DPM [facility]  Dr. Edrick Kins, [address]                                        [place], [place] [address]                Office [phone]  Fax [phone]

## 2018-08-26 DIAGNOSIS — M25551 Pain in right hip: Secondary | ICD-10-CM | POA: Diagnosis not present

## 2018-08-26 DIAGNOSIS — M25552 Pain in left hip: Secondary | ICD-10-CM | POA: Diagnosis not present

## 2018-09-05 ENCOUNTER — Ambulatory Visit (INDEPENDENT_AMBULATORY_CARE_PROVIDER_SITE_OTHER): Payer: Medicare Other

## 2018-09-05 DIAGNOSIS — J301 Allergic rhinitis due to pollen: Secondary | ICD-10-CM

## 2018-09-05 DIAGNOSIS — M25552 Pain in left hip: Secondary | ICD-10-CM | POA: Diagnosis not present

## 2018-09-05 DIAGNOSIS — M25551 Pain in right hip: Secondary | ICD-10-CM | POA: Diagnosis not present

## 2018-09-07 DIAGNOSIS — M25551 Pain in right hip: Secondary | ICD-10-CM | POA: Diagnosis not present

## 2018-09-07 DIAGNOSIS — M25552 Pain in left hip: Secondary | ICD-10-CM | POA: Diagnosis not present

## 2018-09-13 ENCOUNTER — Other Ambulatory Visit: Payer: Self-pay | Admitting: Nurse Practitioner

## 2018-09-13 DIAGNOSIS — Z1231 Encounter for screening mammogram for malignant neoplasm of breast: Secondary | ICD-10-CM

## 2018-09-14 DIAGNOSIS — M25551 Pain in right hip: Secondary | ICD-10-CM | POA: Diagnosis not present

## 2018-09-14 DIAGNOSIS — M25552 Pain in left hip: Secondary | ICD-10-CM | POA: Diagnosis not present

## 2018-09-19 ENCOUNTER — Ambulatory Visit: Payer: Self-pay

## 2018-09-20 ENCOUNTER — Other Ambulatory Visit: Payer: Self-pay | Admitting: Physical Medicine and Rehabilitation

## 2018-09-20 ENCOUNTER — Ambulatory Visit (INDEPENDENT_AMBULATORY_CARE_PROVIDER_SITE_OTHER): Payer: Medicare Other

## 2018-09-20 DIAGNOSIS — M4807 Spinal stenosis, lumbosacral region: Secondary | ICD-10-CM | POA: Diagnosis not present

## 2018-09-20 DIAGNOSIS — J301 Allergic rhinitis due to pollen: Secondary | ICD-10-CM

## 2018-09-20 DIAGNOSIS — M545 Low back pain: Secondary | ICD-10-CM | POA: Diagnosis not present

## 2018-09-20 DIAGNOSIS — G8929 Other chronic pain: Secondary | ICD-10-CM | POA: Diagnosis not present

## 2018-09-20 DIAGNOSIS — M5416 Radiculopathy, lumbar region: Secondary | ICD-10-CM

## 2018-09-20 DIAGNOSIS — M4186 Other forms of scoliosis, lumbar region: Secondary | ICD-10-CM | POA: Diagnosis not present

## 2018-09-28 ENCOUNTER — Ambulatory Visit
Admission: RE | Admit: 2018-09-28 | Discharge: 2018-09-28 | Disposition: A | Discharge status: Home / Self Care | Payer: Medicare Other | Source: Ambulatory Visit | Attending: Nurse Practitioner | Admitting: Nurse Practitioner

## 2018-09-28 DIAGNOSIS — Z1231 Encounter for screening mammogram for malignant neoplasm of breast: Secondary | ICD-10-CM | POA: Insufficient documentation

## 2018-09-30 ENCOUNTER — Ambulatory Visit
Admission: RE | Admit: 2018-09-30 | Discharge: 2018-09-30 | Disposition: A | Discharge status: Home / Self Care | Payer: Medicare Other | Source: Ambulatory Visit | Attending: Physical Medicine and Rehabilitation | Admitting: Physical Medicine and Rehabilitation

## 2018-09-30 ENCOUNTER — Inpatient Hospital Stay: Payer: Medicare Other | Attending: Oncology

## 2018-09-30 ENCOUNTER — Other Ambulatory Visit: Payer: Medicare Other

## 2018-09-30 DIAGNOSIS — M5416 Radiculopathy, lumbar region: Secondary | ICD-10-CM | POA: Insufficient documentation

## 2018-09-30 DIAGNOSIS — M48061 Spinal stenosis, lumbar region without neurogenic claudication: Secondary | ICD-10-CM | POA: Diagnosis not present

## 2018-09-30 DIAGNOSIS — D509 Iron deficiency anemia, unspecified: Secondary | ICD-10-CM | POA: Diagnosis not present

## 2018-09-30 DIAGNOSIS — M5126 Other intervertebral disc displacement, lumbar region: Secondary | ICD-10-CM | POA: Diagnosis not present

## 2018-09-30 LAB — FERRITIN: Ferritin: 180 ng/mL (ref 11–307)

## 2018-09-30 LAB — IRON AND TIBC
Iron: 85 ug/dL (ref 28–170)
Saturation Ratios: 19 % (ref 10.4–31.8)
TIBC: 453 ug/dL — ABNORMAL HIGH (ref 250–450)
UIBC: 368 ug/dL

## 2018-09-30 LAB — CBC
HCT: 40.6 % (ref 36.0–46.0)
HEMOGLOBIN: 13.1 g/dL (ref 12.0–15.0)
MCH: 30.5 pg (ref 26.0–34.0)
MCHC: 32.3 g/dL (ref 30.0–36.0)
MCV: 94.6 fL (ref 80.0–100.0)
Platelets: 353 10*3/uL (ref 150–400)
RBC: 4.29 MIL/uL (ref 3.87–5.11)
RDW: 12.7 % (ref 11.5–15.5)
WBC: 4.5 10*3/uL (ref 4.0–10.5)
nRBC: 0 % (ref 0.0–0.2)

## 2018-10-03 ENCOUNTER — Ambulatory Visit (INDEPENDENT_AMBULATORY_CARE_PROVIDER_SITE_OTHER): Payer: Medicare Other

## 2018-10-03 DIAGNOSIS — M7062 Trochanteric bursitis, left hip: Secondary | ICD-10-CM | POA: Diagnosis not present

## 2018-10-03 DIAGNOSIS — J301 Allergic rhinitis due to pollen: Secondary | ICD-10-CM

## 2018-10-03 DIAGNOSIS — M47816 Spondylosis without myelopathy or radiculopathy, lumbar region: Secondary | ICD-10-CM | POA: Diagnosis not present

## 2018-10-03 DIAGNOSIS — G8929 Other chronic pain: Secondary | ICD-10-CM | POA: Diagnosis not present

## 2018-10-03 DIAGNOSIS — M545 Low back pain: Secondary | ICD-10-CM | POA: Diagnosis not present

## 2018-10-11 ENCOUNTER — Ambulatory Visit (INDEPENDENT_AMBULATORY_CARE_PROVIDER_SITE_OTHER): Payer: Medicare Other | Admitting: Adult Health

## 2018-10-11 ENCOUNTER — Other Ambulatory Visit: Payer: Self-pay

## 2018-10-11 ENCOUNTER — Encounter: Payer: Self-pay | Admitting: Adult Health

## 2018-10-11 VITALS — BP 132/70 | HR 89 | Temp 98.1°F | Resp 16 | Ht 61.0 in | Wt 132.0 lb

## 2018-10-11 DIAGNOSIS — J029 Acute pharyngitis, unspecified: Secondary | ICD-10-CM | POA: Diagnosis not present

## 2018-10-11 DIAGNOSIS — R05 Cough: Secondary | ICD-10-CM

## 2018-10-11 DIAGNOSIS — J988 Other specified respiratory disorders: Secondary | ICD-10-CM

## 2018-10-11 DIAGNOSIS — R059 Cough, unspecified: Secondary | ICD-10-CM

## 2018-10-11 LAB — POCT INFLUENZA A/B
Influenza A, POC: NEGATIVE
Influenza B, POC: NEGATIVE

## 2018-10-11 MED ORDER — AMOXICILLIN-POT CLAVULANATE 875-125 MG PO TABS: 1.0000 | ORAL_TABLET | Freq: Two times a day (BID) | ORAL | 0 refills | Status: DC

## 2018-10-11 MED ORDER — PREDNISONE 10 MG PO TABS: ORAL_TABLET | ORAL | 1 refills | Status: DC

## 2018-10-11 NOTE — Patient Instructions (Signed)

## 2018-10-11 NOTE — Progress Notes (Signed)
[facility] [place], [place] [address]  Internal MEDICINE  Office Visit Note  Patient Name: Vanessa Evans  [id number]  [id number]  Date of Service: 10/11/2018  Chief Complaint  Patient presents with  . Cough    hurts when coughing , started sunday , discolored phlegm  . Sinusitis  . Sore Throat     HPI Pt is here for a sick visit. Pt reports two days of productive cough, sinus pain/pressure, sore throat. She reports she felt fever/chills initially but has not in the last 24 hours.  She has had multiple sick contacts recently.  She has been using her nebulizer and inhalers.  She has multiple medication allergies, and typically takes Augmentin for respiratory issues.    Current Medication:  Outpatient Encounter Medications as of 10/11/2018  Medication Sig  . acetaminophen (TYLENOL) 500 MG tablet Take 1,000 mg by mouth 2 (two) times daily as needed for moderate pain.  Marland Kitchen albuterol (PROVENTIL) (2.5 MG/3ML) 0.083% nebulizer solution Take 3 mLs (2.5 mg total) by nebulization every 6 (six) hours as needed for wheezing or shortness of breath.  Marland Kitchen albuterol (VENTOLIN HFA) 108 (90 Base) MCG/ACT inhaler Inhale 2 puffs into the lungs every 4 (four) hours as needed for wheezing or shortness of breath.  Marland Kitchen aspirin EC 81 MG tablet Take 81 mg by mouth at bedtime.   . benzonatate (TESSALON) 100 MG capsule Take 2 capsules (200 mg total) by mouth 2 (two) times daily as needed for cough.  . Calcium Carbonate-Vitamin D (CALCIUM 600+D) 600-400 MG-UNIT tablet Take 1 tablet by mouth 2 (two) times daily.  . cholecalciferol (VITAMIN D) 1000 UNITS tablet Take 1,000 Units by mouth at bedtime.   . citalopram (CELEXA) 10 MG tablet Take 1 tablet (10 mg total) by mouth at bedtime.  . cyanocobalamin 500 MCG tablet Take 500 mcg by mouth daily.  Marland Kitchen diltiazem (CARDIZEM CD) 120 MG 24 hr capsule Take 120 mg by mouth at bedtime.   . Diphenhyd-Hydrocort-Nystatin (FIRST-DUKES MOUTHWASH) SUSP  Swish and swallow 85mls QID prn sore throat.  . diphenhydrAMINE (BENADRYL) 25 MG tablet Take 25-50 mg by mouth daily as needed for itching.  . docusate sodium (COLACE) 100 MG capsule Take 200 mg by mouth at bedtime as needed for moderate constipation.   Marland Kitchen EPIPEN 2-PAK 0.3 MG/0.3ML SOAJ injection use as directed for severe allergy reaction  . estradiol (ESTRACE VAGINAL) 0.1 MG/GM vaginal cream Apply a pea-sized amount to urethra at bedtime  . fenofibrate 160 MG tablet take1 tab po daily for chol  . folic acid (FOLVITE) 628 MCG tablet Take 400 mcg by mouth daily.  Marland Kitchen guaiFENesin (MUCINEX) 600 MG 12 hr tablet Take 600 mg by mouth at bedtime as needed (congestion).  . Hypromellose (ARTIFICIAL TEARS OP) Place 1-2 drops into both eyes daily as needed (dry eyes).  Marland Kitchen ketotifen (ZADITOR) 0.025 % ophthalmic solution Place 1 drop into both eyes 2 (two) times daily as needed (allergies).  Marland Kitchen levothyroxine (SYNTHROID, LEVOTHROID) 50 MCG tablet Take 50 mcg by mouth daily before breakfast. Brand Name only  . Magnesium 500 MG TABS Take 100 mg by mouth at bedtime.   . Melatonin 5 MG TABS Take 1 tablet by mouth at bedtime. For sleep   . Multiple Vitamins-Minerals (MULTIVITAMIN WITH MINERALS) tablet Take 1 tablet by mouth daily.  . pantoprazole (PROTONIX) 40 MG tablet Take 40 mg by mouth 2 (two) times daily.  Marland Kitchen Phenylephrine-guaiFENesin 10-400 MG TABS Take 1 tablet by mouth every  4 (four) hours as needed (congestion).  . predniSONE (STERAPRED UNI-PAK 48 TAB) 10 MG (48) TBPK tablet Take by mouth as directed for 12 days  . pyridoxine (B-6) 100 MG tablet Take 100 mg by mouth daily.  . sodium chloride (OCEAN) 0.65 % SOLN nasal spray Place 1 spray into both nostrils 2 (two) times daily.  Marland Kitchen zinc gluconate 50 MG tablet Take 50 mg by mouth daily.   No facility-administered encounter medications on file as of 10/11/2018.       Medical History: Past Medical History:  Diagnosis Date  . Arthritis    osteo  . Asthma     needs rescue inhaler few times a year  . Depression   . Dizziness    light headed spells but it has been a while  . Dysrhythmia    tachycardia.(cardizem). brady during procedures  . GERD (gastroesophageal reflux disease)    gastritis  . Headache(784.0)   . Heart murmur 2019   aortic. dr. Nehemiah Massed not worried about this  . Hyperlipidemia   . Hypertension   . Hypothyroidism   . Migraine   . Orthopnea   . Pneumonia    long time ago (greater than 5 years ago)  . PONV (postoperative nausea and vomiting)    very anxious during cataract surgery. brady during colonoscopy  . Shortness of breath    any exertion, cannot lie flat     Vital Signs: BP 132/70   Pulse 89   Temp 98.1 F (36.7 C) (Oral)   Resp 16   Ht 5\' 1"  (1.549 m)   Wt 132 lb (59.9 kg)   SpO2 94%   BMI 24.94 kg/m    Review of Systems  Constitutional: Negative for chills, fatigue and unexpected weight change.  HENT: Positive for postnasal drip, rhinorrhea, sinus pressure, sinus pain and sore throat. Negative for congestion and sneezing.   Eyes: Negative for photophobia, pain and redness.  Respiratory: Positive for cough and wheezing. Negative for chest tightness and shortness of breath.   Cardiovascular: Negative for chest pain and palpitations.  Gastrointestinal: Negative for abdominal pain, constipation, diarrhea, nausea and vomiting.  Endocrine: Negative.   Genitourinary: Negative for dysuria and frequency.  Musculoskeletal: Negative for arthralgias, back pain, joint swelling and neck pain.  Skin: Negative for rash.  Allergic/Immunologic: Negative.   Neurological: Negative for tremors and numbness.  Hematological: Negative for adenopathy. Does not bruise/bleed easily.  Psychiatric/Behavioral: Negative for behavioral problems and sleep disturbance. The patient is not nervous/anxious.     Physical Exam Vitals signs and nursing note reviewed.  Constitutional:      General: She is not in acute distress.     Appearance: She is well-developed. She is not diaphoretic.  HENT:     Head: Normocephalic and atraumatic.     Mouth/Throat:     Pharynx: No oropharyngeal exudate.  Eyes:     Pupils: Pupils are equal, round, and reactive to light.  Neck:     Musculoskeletal: Normal range of motion and neck supple.     Thyroid: No thyromegaly.     Vascular: No JVD.     Trachea: No tracheal deviation.  Cardiovascular:     Rate and Rhythm: Normal rate and regular rhythm.     Heart sounds: Normal heart sounds. No murmur. No friction rub. No gallop.   Pulmonary:     Effort: Pulmonary effort is normal. No respiratory distress.     Breath sounds: Normal breath sounds. No wheezing or rales.  Chest:  Chest wall: No tenderness.  Abdominal:     Palpations: Abdomen is soft.     Tenderness: There is no abdominal tenderness. There is no guarding.  Musculoskeletal: Normal range of motion.  Lymphadenopathy:     Cervical: No cervical adenopathy.  Skin:    General: Skin is warm and dry.  Neurological:     Mental Status: She is alert and oriented to person, place, and time.     Cranial Nerves: No cranial nerve deficit.  Psychiatric:        Behavior: Behavior normal.        Thought Content: Thought content normal.        Judgment: Judgment normal.    Assessment/Plan: 1. Respiratory infection Patient provided course of Augmentin.  Instructed patient to take medicine until complete.  Return to clinic if symptoms fail to improve. - amoxicillin-clavulanate (AUGMENTIN) 875-125 MG tablet; Take 1 tablet by mouth 2 (two) times daily.  Dispense: 20 tablet; Refill: 0  2. Sore throat Flu test negative today. - POCT Influenza A/B  3. Cough Provided a prednisone Dosepak to help patient with bronchial inflammation and cough. - predniSONE (DELTASONE) 10 MG tablet; Use per dose pack  Dispense: 21 tablet; Refill: 1  General Counseling: Vanessa Evans verbalizes understanding of the findings of todays visit and agrees with  plan of treatment. I have discussed any further diagnostic evaluation that may be needed or ordered today. We also reviewed her medications today. she has been encouraged to call the office with any questions or concerns that should arise related to todays visit.   Orders Placed This Encounter  Procedures  . POCT Influenza A/B    No orders of the defined types were placed in this encounter.   Time spent: 25 Minutes  This patient was seen by Orson Gear AGNP-C in Collaboration with Dr Lavera Guise as a part of collaborative care agreement.  Kendell Bane AGNP-C Internal Medicine

## 2018-10-14 ENCOUNTER — Other Ambulatory Visit: Payer: Self-pay

## 2018-10-14 DIAGNOSIS — H43823 Vitreomacular adhesion, bilateral: Secondary | ICD-10-CM | POA: Diagnosis not present

## 2018-10-14 DIAGNOSIS — J4520 Mild intermittent asthma, uncomplicated: Secondary | ICD-10-CM

## 2018-10-14 DIAGNOSIS — H35373 Puckering of macula, bilateral: Secondary | ICD-10-CM | POA: Diagnosis not present

## 2018-10-14 MED ORDER — ALBUTEROL SULFATE HFA 108 (90 BASE) MCG/ACT IN AERS: 2.0000 | INHALATION_SPRAY | RESPIRATORY_TRACT | 5 refills | Status: DC | PRN

## 2018-10-17 ENCOUNTER — Ambulatory Visit: Payer: Self-pay

## 2018-10-20 ENCOUNTER — Ambulatory Visit: Payer: Self-pay

## 2018-10-20 ENCOUNTER — Encounter: Payer: Self-pay | Admitting: Adult Health

## 2018-10-20 ENCOUNTER — Ambulatory Visit (INDEPENDENT_AMBULATORY_CARE_PROVIDER_SITE_OTHER): Payer: Medicare Other | Admitting: Adult Health

## 2018-10-20 ENCOUNTER — Ambulatory Visit (INDEPENDENT_AMBULATORY_CARE_PROVIDER_SITE_OTHER): Payer: Medicare Other

## 2018-10-20 VITALS — BP 140/78 | HR 92 | Temp 97.8°F | Resp 16 | Ht 61.0 in | Wt 134.0 lb

## 2018-10-20 DIAGNOSIS — I10 Essential (primary) hypertension: Secondary | ICD-10-CM | POA: Diagnosis not present

## 2018-10-20 DIAGNOSIS — J301 Allergic rhinitis due to pollen: Secondary | ICD-10-CM | POA: Diagnosis not present

## 2018-10-20 DIAGNOSIS — J4520 Mild intermittent asthma, uncomplicated: Secondary | ICD-10-CM

## 2018-10-20 DIAGNOSIS — J988 Other specified respiratory disorders: Secondary | ICD-10-CM | POA: Diagnosis not present

## 2018-10-20 NOTE — Progress Notes (Signed)
[facility] [place], [place] [address]  Internal MEDICINE  Office Visit Note  Patient Name: Vanessa Evans  [id number]  [id number]  Date of Service: 10/20/2018  Chief Complaint  Patient presents with  . Sinusitis    sinus pressure     HPI Pt is here for a sick visit.  Patient was seen 1 week ago and prescribed Augmentin and prednisone taper.  Patient continues to report severe sinus pain and tenderness.  She has been taking Mucinex, Xyzal, Benadryl, and her inhaler as well as the Augmentin and prednisone.  She does report some intermittent chest tightness from her cough.  She does report that her cough and her sore throat have improved.  Her mucus has changed to clear.  Her main complaint is the sinus pressure.   Current Medication:  Outpatient Encounter Medications as of 10/20/2018  Medication Sig  . acetaminophen (TYLENOL) 500 MG tablet Take 1,000 mg by mouth 2 (two) times daily as needed for moderate pain.  Marland Kitchen albuterol (PROVENTIL) (2.5 MG/3ML) 0.083% nebulizer solution Take 3 mLs (2.5 mg total) by nebulization every 6 (six) hours as needed for wheezing or shortness of breath.  Marland Kitchen albuterol (VENTOLIN HFA) 108 (90 Base) MCG/ACT inhaler Inhale 2 puffs into the lungs every 4 (four) hours as needed for wheezing or shortness of breath.  Marland Kitchen amoxicillin-clavulanate (AUGMENTIN) 875-125 MG tablet Take 1 tablet by mouth 2 (two) times daily.  Marland Kitchen aspirin EC 81 MG tablet Take 81 mg by mouth at bedtime.   . benzonatate (TESSALON) 100 MG capsule Take 2 capsules (200 mg total) by mouth 2 (two) times daily as needed for cough.  . Calcium Carbonate-Vitamin D (CALCIUM 600+D) 600-400 MG-UNIT tablet Take 1 tablet by mouth 2 (two) times daily.  . cholecalciferol (VITAMIN D) 1000 UNITS tablet Take 1,000 Units by mouth at bedtime.   . citalopram (CELEXA) 10 MG tablet Take 1 tablet (10 mg total) by mouth at bedtime.  . cyanocobalamin 500 MCG tablet Take 500 mcg by mouth daily.   Marland Kitchen diltiazem (CARDIZEM CD) 120 MG 24 hr capsule Take 120 mg by mouth at bedtime.   . Diphenhyd-Hydrocort-Nystatin (FIRST-DUKES MOUTHWASH) SUSP Swish and swallow 32mls QID prn sore throat.  . diphenhydrAMINE (BENADRYL) 25 MG tablet Take 25-50 mg by mouth daily as needed for itching.  . docusate sodium (COLACE) 100 MG capsule Take 200 mg by mouth at bedtime as needed for moderate constipation.   Marland Kitchen EPIPEN 2-PAK 0.3 MG/0.3ML SOAJ injection use as directed for severe allergy reaction  . estradiol (ESTRACE VAGINAL) 0.1 MG/GM vaginal cream Apply a pea-sized amount to urethra at bedtime  . fenofibrate 160 MG tablet take1 tab po daily for chol  . folic acid (FOLVITE) 856 MCG tablet Take 400 mcg by mouth daily.  Marland Kitchen guaiFENesin (MUCINEX) 600 MG 12 hr tablet Take 600 mg by mouth at bedtime as needed (congestion).  . Hypromellose (ARTIFICIAL TEARS OP) Place 1-2 drops into both eyes daily as needed (dry eyes).  Marland Kitchen ketotifen (ZADITOR) 0.025 % ophthalmic solution Place 1 drop into both eyes 2 (two) times daily as needed (allergies).  Marland Kitchen levothyroxine (SYNTHROID, LEVOTHROID) 50 MCG tablet Take 50 mcg by mouth daily before breakfast. Brand Name only  . Magnesium 500 MG TABS Take 100 mg by mouth at bedtime.   . Melatonin 5 MG TABS Take 1 tablet by mouth at bedtime. For sleep   . Multiple Vitamins-Minerals (MULTIVITAMIN WITH MINERALS) tablet Take 1 tablet by mouth daily.  Marland Kitchen  pantoprazole (PROTONIX) 40 MG tablet Take 40 mg by mouth 2 (two) times daily.  Marland Kitchen Phenylephrine-guaiFENesin 10-400 MG TABS Take 1 tablet by mouth every 4 (four) hours as needed (congestion).  . predniSONE (DELTASONE) 10 MG tablet Use per dose pack  . pyridoxine (B-6) 100 MG tablet Take 100 mg by mouth daily.  . sodium chloride (OCEAN) 0.65 % SOLN nasal spray Place 1 spray into both nostrils 2 (two) times daily.  Marland Kitchen zinc gluconate 50 MG tablet Take 50 mg by mouth daily.   No facility-administered encounter medications on file as of 10/20/2018.        Medical History: Past Medical History:  Diagnosis Date  . Arthritis    osteo  . Asthma    needs rescue inhaler few times a year  . Depression   . Dizziness    light headed spells but it has been a while  . Dysrhythmia    tachycardia.(cardizem). brady during procedures  . GERD (gastroesophageal reflux disease)    gastritis  . Headache(784.0)   . Heart murmur 2019   aortic. dr. Nehemiah Massed not worried about this  . Hyperlipidemia   . Hypertension   . Hypothyroidism   . Migraine   . Orthopnea   . Pneumonia    long time ago (greater than 5 years ago)  . PONV (postoperative nausea and vomiting)    very anxious during cataract surgery. brady during colonoscopy  . Shortness of breath    any exertion, cannot lie flat     Vital Signs: BP 140/78   Pulse 92   Temp 97.8 F (36.6 C)   Resp 16   Ht 5\' 1"  (1.549 m)   Wt 134 lb (60.8 kg)   SpO2 97%   BMI 25.32 kg/m    Review of Systems  Constitutional: Negative for chills, fatigue and unexpected weight change.  HENT: Positive for sinus pressure. Negative for congestion, rhinorrhea, sneezing and sore throat.   Eyes: Negative for photophobia, pain and redness.  Respiratory: Negative for cough, chest tightness and shortness of breath.   Cardiovascular: Negative for chest pain and palpitations.  Gastrointestinal: Negative for abdominal pain, constipation, diarrhea, nausea and vomiting.  Endocrine: Negative.   Genitourinary: Negative for dysuria and frequency.  Musculoskeletal: Negative for arthralgias, back pain, joint swelling and neck pain.  Skin: Negative for rash.  Allergic/Immunologic: Negative.   Neurological: Negative for tremors and numbness.  Hematological: Negative for adenopathy. Does not bruise/bleed easily.  Psychiatric/Behavioral: Negative for behavioral problems and sleep disturbance. The patient is not nervous/anxious.     Physical Exam Vitals signs and nursing note reviewed.  Constitutional:       General: She is not in acute distress.    Appearance: She is well-developed. She is not diaphoretic.  HENT:     Head: Normocephalic and atraumatic.     Nose:     Comments: Turbinates pale, inflamed bilaterally    Mouth/Throat:     Pharynx: No oropharyngeal exudate.  Eyes:     Pupils: Pupils are equal, round, and reactive to light.  Neck:     Musculoskeletal: Normal range of motion and neck supple.     Thyroid: No thyromegaly.     Vascular: No JVD.     Trachea: No tracheal deviation.  Cardiovascular:     Rate and Rhythm: Normal rate and regular rhythm.     Heart sounds: Normal heart sounds. No murmur. No friction rub. No gallop.   Pulmonary:     Effort: Pulmonary effort  is normal. No respiratory distress.     Breath sounds: Normal breath sounds. No wheezing or rales.  Chest:     Chest wall: No tenderness.  Abdominal:     Palpations: Abdomen is soft.     Tenderness: There is no abdominal tenderness. There is no guarding.  Musculoskeletal: Normal range of motion.  Lymphadenopathy:     Cervical: No cervical adenopathy.  Skin:    General: Skin is warm and dry.  Neurological:     Mental Status: She is alert and oriented to person, place, and time.     Cranial Nerves: No cranial nerve deficit.  Psychiatric:        Behavior: Behavior normal.        Thought Content: Thought content normal.        Judgment: Judgment normal.    Assessment/Plan: 1. Seasonal allergic rhinitis due to pollen Patient's lingering cough most likely due to postnasal drip.  Her sinus pressure is likely due to seasonal allergies as her turbinates are pale bilaterally.  Encourage patient to get OTC Flonase and use 2 squirts each nostril twice a day.  As she continues to have no fever and her symptoms improve she does not need to follow-up with [place] for this.  She verbalized understanding.  2. Respiratory infection Improved, patient denies any fever chills.  3. Mild intermittent asthma without  complication Stable, continue current therapy as prescribed.  4. Benign essential HTN Stable, continue current medications as prescribed.  General Counseling: Vanessa Evans verbalizes understanding of the findings of todays visit and agrees with plan of treatment. I have discussed any further diagnostic evaluation that may be needed or ordered today. We also reviewed her medications today. she has been encouraged to call the office with any questions or concerns that should arise related to todays visit.   No orders of the defined types were placed in this encounter.   No orders of the defined types were placed in this encounter.   Time spent:25 Minutes  This patient was seen by Orson Gear AGNP-C in Collaboration with Dr Lavera Guise as a part of collaborative care agreement.  Kendell Bane AGNP-C Internal Medicine

## 2018-10-21 ENCOUNTER — Ambulatory Visit: Payer: Self-pay

## 2018-10-31 ENCOUNTER — Ambulatory Visit: Payer: Self-pay | Admitting: Internal Medicine

## 2018-11-25 ENCOUNTER — Other Ambulatory Visit: Payer: Self-pay

## 2018-11-25 MED ORDER — LEVOCETIRIZINE DIHYDROCHLORIDE 5 MG PO TABS: 5.0000 mg | ORAL_TABLET | Freq: Every evening | ORAL | 2 refills | Status: DC

## 2018-11-30 ENCOUNTER — Encounter: Payer: Self-pay | Admitting: Nurse Practitioner

## 2018-12-26 ENCOUNTER — Ambulatory Visit (INDEPENDENT_AMBULATORY_CARE_PROVIDER_SITE_OTHER): Payer: Medicare Other | Admitting: Nurse Practitioner

## 2018-12-26 ENCOUNTER — Encounter: Payer: Self-pay | Admitting: Nurse Practitioner

## 2018-12-26 ENCOUNTER — Other Ambulatory Visit: Payer: Self-pay

## 2018-12-26 VITALS — BP 123/69 | HR 84 | Resp 16 | Ht 61.0 in | Wt 138.0 lb

## 2018-12-26 DIAGNOSIS — E782 Mixed hyperlipidemia: Secondary | ICD-10-CM | POA: Diagnosis not present

## 2018-12-26 DIAGNOSIS — I10 Essential (primary) hypertension: Secondary | ICD-10-CM | POA: Diagnosis not present

## 2018-12-26 DIAGNOSIS — F339 Major depressive disorder, recurrent, unspecified: Secondary | ICD-10-CM

## 2018-12-26 DIAGNOSIS — J301 Allergic rhinitis due to pollen: Secondary | ICD-10-CM | POA: Diagnosis not present

## 2018-12-26 MED ORDER — FENOFIBRATE 160 MG PO TABS: ORAL_TABLET | ORAL | 5 refills | Status: DC

## 2018-12-26 MED ORDER — LEVOCETIRIZINE DIHYDROCHLORIDE 5 MG PO TABS: 5.0000 mg | ORAL_TABLET | Freq: Every evening | ORAL | 5 refills | Status: DC

## 2018-12-26 MED ORDER — CITALOPRAM HYDROBROMIDE 10 MG PO TABS: 10.0000 mg | ORAL_TABLET | Freq: Every day | ORAL | 5 refills | Status: DC

## 2018-12-26 NOTE — Progress Notes (Signed)
[facility] [place], [place] [id number]  Internal MEDICINE  Telephone Visit  Patient Name: Vanessa Evans  [id number]  [id number]  Date of Service: 01/10/2019  I connected with the patient at 12:41pm by telephone and verified the patients identity using two identifiers.   I discussed the limitations, risks, security and privacy concerns of performing an evaluation and management service by telephone and the availability of in person appointments. I also discussed with the patient that there may be a patient responsible charge related to the service.  The patient expressed understanding and agrees to proceed.    Chief Complaint  Patient presents with  . Telephone Assessment  . Telephone Screen  . Hyperlipidemia  . Hypertension  . Hypothyroidism  . Fatigue  . Joint Pain    worsening    The patient has been contacted via telephone for follow up visit due to concerns for spread of novel coronavirus. The patient is c/o increased fatigue and reduced energy. Has had to have iron infusions in the past. Is scheduled to have blood work done next week to see if she is due to have another infusion. She is having increased joint and lower back pain. She sees Dr. Sharlet Salina for pain management. She had MRI done in 09/2018, confirmed that she has multilevel disc protrusion and anterolisthesis in lumbar spine. She was to have injections into the spine, however, these were put on hold due to fear of COVID 19 spread. She also has bursitis in the hip and does see orthopedics for this. She has been unable to get injections as cortisone injections may lower resistance to COVID 19.       Current Medication: Outpatient Encounter Medications as of 12/26/2018  Medication Sig  . acetaminophen (TYLENOL) 500 MG tablet Take 1,000 mg by mouth 2 (two) times daily as needed for moderate pain.  Marland Kitchen albuterol (PROVENTIL) (2.5 MG/3ML) 0.083% nebulizer solution Take 3 mLs (2.5 mg total) by  nebulization every 6 (six) hours as needed for wheezing or shortness of breath.  Marland Kitchen albuterol (VENTOLIN HFA) 108 (90 Base) MCG/ACT inhaler Inhale 2 puffs into the lungs every 4 (four) hours as needed for wheezing or shortness of breath.  Marland Kitchen aspirin EC 81 MG tablet Take 81 mg by mouth at bedtime.   . benzonatate (TESSALON) 100 MG capsule Take 2 capsules (200 mg total) by mouth 2 (two) times daily as needed for cough.  . Calcium Carbonate-Vitamin D (CALCIUM 600+D) 600-400 MG-UNIT tablet Take 1 tablet by mouth 2 (two) times daily.  . cholecalciferol (VITAMIN D) 1000 UNITS tablet Take 1,000 Units by mouth at bedtime.   . citalopram (CELEXA) 10 MG tablet Take 1 tablet (10 mg total) by mouth at bedtime.  . cyanocobalamin 500 MCG tablet Take 500 mcg by mouth daily.  Marland Kitchen diltiazem (CARDIZEM CD) 120 MG 24 hr capsule Take 120 mg by mouth at bedtime.   . Diphenhyd-Hydrocort-Nystatin (FIRST-DUKES MOUTHWASH) SUSP Swish and swallow 46mls QID prn sore throat. (Patient not taking: Reported on 01/09/2019)  . diphenhydrAMINE (BENADRYL) 25 MG tablet Take 25-50 mg by mouth daily as needed for itching.  . docusate sodium (COLACE) 100 MG capsule Take 200 mg by mouth at bedtime as needed for moderate constipation.   Marland Kitchen EPIPEN 2-PAK 0.3 MG/0.3ML SOAJ injection use as directed for severe allergy reaction  . estradiol (ESTRACE VAGINAL) 0.1 MG/GM vaginal cream Apply a pea-sized amount to urethra at bedtime  . fenofibrate 160 MG tablet take1 tab po daily for  chol  . folic acid (FOLVITE) 778 MCG tablet Take 400 mcg by mouth daily.  Marland Kitchen guaiFENesin (MUCINEX) 600 MG 12 hr tablet Take 600 mg by mouth at bedtime as needed (congestion).  . Hypromellose (ARTIFICIAL TEARS OP) Place 1-2 drops into both eyes daily as needed (dry eyes).  Marland Kitchen ketotifen (ZADITOR) 0.025 % ophthalmic solution Place 1 drop into both eyes 2 (two) times daily as needed (allergies).  Marland Kitchen levocetirizine (XYZAL) 5 MG tablet Take 1 tablet (5 mg total) by mouth every evening.   Marland Kitchen levothyroxine (SYNTHROID, LEVOTHROID) 50 MCG tablet Take 50 mcg by mouth daily before breakfast. Brand Name only  . Melatonin 5 MG TABS Take 1 tablet by mouth at bedtime. For sleep   . Multiple Vitamins-Minerals (MULTIVITAMIN WITH MINERALS) tablet Take 1 tablet by mouth daily.  . pantoprazole (PROTONIX) 40 MG tablet Take 40 mg by mouth 2 (two) times daily.  Marland Kitchen Phenylephrine-guaiFENesin 10-400 MG TABS Take 1 tablet by mouth every 4 (four) hours as needed (congestion).  . pyridoxine (B-6) 100 MG tablet Take 100 mg by mouth daily.  . sodium chloride (OCEAN) 0.65 % SOLN nasal spray Place 1 spray into both nostrils 2 (two) times daily.  Marland Kitchen zinc gluconate 50 MG tablet Take 50 mg by mouth daily.  . [DISCONTINUED] amoxicillin-clavulanate (AUGMENTIN) 875-125 MG tablet Take 1 tablet by mouth 2 (two) times daily.  . [DISCONTINUED] citalopram (CELEXA) 10 MG tablet Take 1 tablet (10 mg total) by mouth at bedtime.  . [DISCONTINUED] fenofibrate 160 MG tablet take1 tab po daily for chol  . [DISCONTINUED] levocetirizine (XYZAL) 5 MG tablet Take 1 tablet (5 mg total) by mouth every evening.  . [DISCONTINUED] Magnesium 500 MG TABS Take 100 mg by mouth at bedtime.   . [DISCONTINUED] predniSONE (DELTASONE) 10 MG tablet Use per dose pack   No facility-administered encounter medications on file as of 12/26/2018.     Surgical History: Past Surgical History:  Procedure Laterality Date  . 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 BIOPSY THYROID  2018  . bladder biopsies  2003   9 biopsies done by dr. cope.  all benign.  inflammatory process going on in bladder  . BREAST BIOPSY Right    benign  . BREAST SURGERY Right    lumpectomy  . CATARACT EXTRACTION W/PHACO Left 12/25/2014   Procedure: CATARACT EXTRACTION PHACO AND INTRAOCULAR LENS PLACEMENT (IOC);  Surgeon: Birder Robson, MD;  Location: [facility] ORS;  Service: Ophthalmology;  Laterality: Left;  [place]  00:32 AP% 20.0 CDE 6.49  . COLONOSCOPY W/ BIOPSIES    . COLONOSCOPY W/ POLYPECTOMY  2002, 2004, 2005   adenomatous polyps removed  . COLONOSCOPY WITH PROPOFOL N/A 08/23/2017   Procedure: COLONOSCOPY WITH PROPOFOL;  Surgeon: Manya Silvas, MD;  [facility] ENDOSCOPY;  Service: Endoscopy;  Laterality: N/A;  . COLONOSCOPY WITH PROPOFOL N/A 11/08/2017   Procedure: COLONOSCOPY WITH PROPOFOL;  Surgeon: Manya Silvas, MD;  Location: [facility] ENDOSCOPY;  Service: Endoscopy;  Laterality: N/A;  . CYSTOCELE REPAIR N/A 04/09/2016   Procedure: ANTERIOR REPAIR (CYSTOCELE);  Surgeon: Gae Dry, MD;  Location: [facility] ORS;  Service: Gynecology;  Laterality: N/A;  . DIAGNOSTIC LAPAROSCOPY  2008   removed both ovaries with cysts, tubes and fibroids  . DILATION AND CURETTAGE OF UTERUS  1973  . ESOPHAGOGASTRODUODENOSCOPY (EGD) WITH PROPOFOL N/A 08/23/2017   Procedure: ESOPHAGOGASTRODUODENOSCOPY (EGD) WITH PROPOFOL;  Surgeon: Manya Silvas, MD;  Location: [facility] ENDOSCOPY;  Service: Endoscopy;  Laterality: N/A;  . EYE SURGERY Bilateral 2016  . HERNIA REPAIR Right 2008   Inguinal hernia Repair, ventral hernia repair  . JOINT REPLACEMENT Right 2008   knee  . KNEE ARTHROSCOPY Right   . LUMBAR LAMINECTOMY/DECOMPRESSION MICRODISCECTOMY Right 03/21/2013   Procedure: Right Lumbar five-Sacral one Laminectomy for Synovial Cyst;  Surgeon: Faythe Ghee, MD;  Location: [facility] NEURO ORS;  Service: Neurosurgery;  Laterality: Right;  right  . OOPHORECTOMY    . REVERSE SHOULDER ARTHROPLASTY Right 02/22/2018   Procedure: REVERSE SHOULDER ARTHROPLASTY;  Surgeon: Corky Mull, MD;  Location: [facility] ORS;  Service: Orthopedics;  Laterality: Right;  . TUBAL LIGATION    . UNILATERAL SALPINGECTOMY Left 04/09/2016   Procedure: UNILATERAL SALPINGECTOMY;  Surgeon: Gae Dry, MD;  Location: [facility] ORS;  Service: Gynecology;  Laterality: Left;  . UPPER GI ENDOSCOPY  2010   with biopsy of gastric erosion  . VAGINAL HYSTERECTOMY  N/A 04/09/2016   Procedure: HYSTERECTOMY VAGINAL;  Surgeon: Gae Dry, MD;  Location: [facility] ORS;  Service: Gynecology;  Laterality: N/A;  . VAGINAL HYSTERECTOMY  2017   and bladder tack    Medical History: Past Medical History:  Diagnosis Date  . Arthritis    osteo  . Asthma    needs rescue inhaler few times a year  . Depression   . Dizziness    light headed spells but it has been a while  . Dysrhythmia    tachycardia.(cardizem). brady during procedures  . GERD (gastroesophageal reflux disease)    gastritis  . Headache(784.0)   . Heart murmur 2019   aortic. dr. Nehemiah Massed not worried about this  . Hyperlipidemia   . Hypertension   . Hypothyroidism   . Migraine   . Orthopnea   . Pneumonia    long time ago (greater than 5 years ago)  . PONV (postoperative nausea and vomiting)    very anxious during cataract surgery. brady during colonoscopy  . Shortness of breath    any exertion, cannot lie flat    Family History: Family History  Problem Relation Age of Onset  . Pulmonary fibrosis Mother   . Melanoma Father   . Cardiomyopathy Sister        and arrhythmia  . Breast cancer Cousin        paternal 1st    Social History   Socioeconomic History  . Marital status: Married    Spouse name: Not on file  . Number of children: Not on file  . Years of education: Not on file  . Highest education level: Not on file  Occupational History  . Not on file  Social Needs  . Financial resource strain: Not on file  . Food insecurity:    Worry: Not on file    Inability: Not on file  . Transportation needs:    Medical: Not on file    Non-medical: Not on file  Tobacco Use  . Smoking status: Former Smoker    Types: Cigarettes    Last attempt to quit: 1967    Years since quitting: 53.4  . Smokeless tobacco: Never Used  Substance and Sexual Activity  . Alcohol use: Not Currently  . Drug use: No  . Sexual activity: Not Currently  Lifestyle  . Physical activity:    Days  per week: Not on file    Minutes per session: Not on file  . Stress: Not on file  Relationships  . Social connections:  Talks on phone: Not on file    Gets together: Not on file    Attends religious service: Not on file    Active member of club or organization: Not on file    Attends meetings of clubs or organizations: Not on file    Relationship status: Not on file  . Intimate partner violence:    Fear of current or ex partner: Not on file    Emotionally abused: Not on file    Physically abused: Not on file    Forced sexual activity: Not on file  Other Topics Concern  . Not on file  Social History Narrative  . Not on file      Review of Systems  Constitutional: Positive for fatigue. Negative for chills and unexpected weight change.       States that fatigue is worsening.   HENT: Positive for sinus pressure. Negative for congestion, rhinorrhea, sneezing and sore throat.   Eyes: Negative for photophobia, pain and redness.  Respiratory: Negative for cough, chest tightness and shortness of breath.   Cardiovascular: Negative for chest pain and palpitations.  Gastrointestinal: Negative for abdominal pain, constipation, diarrhea, nausea and vomiting.  Endocrine: Negative.   Genitourinary: Negative for dysuria and frequency.  Musculoskeletal: Negative for arthralgias, back pain, joint swelling and neck pain.  Skin: Negative for rash.  Allergic/Immunologic: Negative.   Neurological: Negative for tremors and numbness.  Hematological: Negative for adenopathy. Does not bruise/bleed easily.  Psychiatric/Behavioral: Negative for behavioral problems and sleep disturbance. The patient is not nervous/anxious.     Today's Vitals   12/26/18 1153  BP: 123/69  Pulse: 84  Resp: 16  Weight: 138 lb (62.6 kg)  Height: 5\' 1"  (1.549 m)   Body mass index is 26.07 kg/m.  Observation/Objective:   The patient is alert and oriented. She is pleasant and answers all questions appropriately.  Breathing is non-labored. She is in no acute distress at this time.  She sounds fatigued.    Assessment/Plan: 1. Benign essential HTN Stable. Continue BP medication as prescribed   2. Mixed hyperlipidemia - fenofibrate 160 MG tablet; take1 tab po daily for chol  Dispense: 30 tablet; Refill: 5  3. Episode of recurrent major depressive disorder, unspecified depression episode severity ([place]) Stable. Continue citalopram 10mg  daily. Refills provided.  - citalopram (CELEXA) 10 MG tablet; Take 1 tablet (10 mg total) by mouth at bedtime.  Dispense: 30 tablet; Refill: 5  4. Allergic rhinitis due to pollen, unspecified seasonality Continue xyzal 5mg  daily. Refills provided  - levocetirizine (XYZAL) 5 MG tablet; Take 1 tablet (5 mg total) by mouth every evening.  Dispense: 30 tablet; Refill: 5  General Counseling: Ladaija verbalizes understanding of the findings of today's phone visit and agrees with plan of treatment. I have discussed any further diagnostic evaluation that may be needed or ordered today. We also reviewed her medications today. she has been encouraged to call the office with any questions or concerns that should arise related to todays visit.  This patient was seen by [place] with Dr Lavera Guise as a part of collaborative care agreement  Meds ordered this encounter  Medications  . citalopram (CELEXA) 10 MG tablet    Sig: Take 1 tablet (10 mg total) by mouth at bedtime.    Dispense:  30 tablet    Refill:  5    Order Specific Question:   Supervising Provider    Answer:   Lavera Guise [[id number]]  . levocetirizine (XYZAL)  5 MG tablet    Sig: Take 1 tablet (5 mg total) by mouth every evening.    Dispense:  30 tablet    Refill:  5    Order Specific Question:   Supervising Provider    Answer:   Lavera Guise [[id number]]  . fenofibrate 160 MG tablet    Sig: take1 tab po daily for chol    Dispense:  30 tablet    Refill:  5    Order Specific Question:    Supervising Provider    Answer:   Lavera Guise [[id number]]    Time spent: 55 Minutes    Dr Lavera Guise Internal medicine

## 2018-12-29 ENCOUNTER — Other Ambulatory Visit: Payer: Medicare Other

## 2018-12-30 ENCOUNTER — Ambulatory Visit: Payer: Medicare Other | Admitting: Oncology

## 2019-01-05 ENCOUNTER — Other Ambulatory Visit: Payer: Self-pay

## 2019-01-06 ENCOUNTER — Inpatient Hospital Stay: Payer: Medicare Other | Attending: Oncology

## 2019-01-06 ENCOUNTER — Telehealth: Payer: Self-pay | Admitting: Oncology

## 2019-01-06 ENCOUNTER — Other Ambulatory Visit: Payer: Self-pay

## 2019-01-06 DIAGNOSIS — D509 Iron deficiency anemia, unspecified: Secondary | ICD-10-CM | POA: Insufficient documentation

## 2019-01-06 LAB — CBC
HCT: 38.4 % (ref 36.0–46.0)
Hemoglobin: 12.3 g/dL (ref 12.0–15.0)
MCH: 29.8 pg (ref 26.0–34.0)
MCHC: 32 g/dL (ref 30.0–36.0)
MCV: 93 fL (ref 80.0–100.0)
Platelets: 314 10*3/uL (ref 150–400)
RBC: 4.13 MIL/uL (ref 3.87–5.11)
RDW: 12.3 % (ref 11.5–15.5)
WBC: 4.9 10*3/uL (ref 4.0–10.5)
nRBC: 0 % (ref 0.0–0.2)

## 2019-01-06 LAB — IRON AND TIBC
Iron: 87 ug/dL (ref 28–170)
Saturation Ratios: 20 % (ref 10.4–31.8)
TIBC: 444 ug/dL (ref 250–450)
UIBC: 357 ug/dL

## 2019-01-06 LAB — FERRITIN: Ferritin: 126 ng/mL (ref 11–307)

## 2019-01-09 ENCOUNTER — Encounter: Payer: Self-pay | Admitting: Oncology

## 2019-01-09 ENCOUNTER — Inpatient Hospital Stay: Payer: Medicare Other | Attending: Oncology | Admitting: Oncology

## 2019-01-09 ENCOUNTER — Ambulatory Visit: Payer: Self-pay | Admitting: Internal Medicine

## 2019-01-09 DIAGNOSIS — D509 Iron deficiency anemia, unspecified: Secondary | ICD-10-CM

## 2019-01-10 NOTE — Progress Notes (Signed)
I connected with Vanessa Evans on 01/10/19 at  1:00 PM EDT by telephone visit and verified that I am speaking with the correct person using two identifiers.   I discussed the limitations, risks, security and privacy concerns of performing an evaluation and management service by telemedicine and the availability of in-person appointments. I also discussed with the patient that there may be a patient responsible charge related to this service. The patient expressed understanding and agreed to proceed.  Other persons participating in the visit and their role in the encounter:  none  Patient's location:  home Provider's location:  home  Chief Complaint:  Routine f/u of iron deficiency anemia  History of present illness: Patient is a 73 year old female who is been referred to [place] for iron deficiency anemia. Most recent labs from 04/26/2018 showed CBC of white count of 3.7, H&H of 11.5/34.6 and a platelet count of 337. Iron study showed a low iron saturation of 8%, elevated TIBC of 501. Ferritin levels were low at 13. B12 and folate were within normal limits. Patient has seen [name] in the past and underwent an endoscopy in January 2019 which showed gastric erosion and features of chronic gastritis. Biopsy was negative for H. pylori. Colonoscopy showed no evidence of active bleeding. Patient also had small bowel capsule endoscopy study which did not show any active bleeding but showed tiny fragile blood vessels in the small bowel which could be possibly the source of bleeding.She reports feeling fatigued and has chronic joint pain. She does have chronic epigastric pain on and off.  She received 2 doses of Feraheme in October 2019   Interval history still feels fatigued and feels that she has no energy.  Denies any blood in her stool or urine   Review of Systems  Constitutional: Negative for chills, fever, malaise/fatigue and weight loss.  HENT: Negative for congestion, ear discharge and  nosebleeds.   Eyes: Negative for blurred vision.  Respiratory: Negative for cough, hemoptysis, sputum production, shortness of breath and wheezing.   Cardiovascular: Negative for chest pain, palpitations, orthopnea and claudication.  Gastrointestinal: Negative for abdominal pain, blood in stool, constipation, diarrhea, heartburn, melena, nausea and vomiting.  Genitourinary: Negative for dysuria, flank pain, frequency, hematuria and urgency.  Musculoskeletal: Negative for back pain, joint pain and myalgias.  Skin: Negative for rash.  Neurological: Negative for dizziness, tingling, focal weakness, seizures, weakness and headaches.  Endo/Heme/Allergies: Does not bruise/bleed easily.  Psychiatric/Behavioral: Negative for depression and suicidal ideas. The patient does not have insomnia.     Allergies  Allergen Reactions  . Codeine Shortness Of Breath and Itching    Bronchospasms and asthma attach  . Levofloxacin     Altered mental status": drunk" feeling, confused, speech problems Other reaction(s): Unknown   . Ultram [Tramadol] Itching and Other (See Comments)    Bronchospasm and asthma attack  . Nsaids Other (See Comments)    Bloody stools, abdominal pain History of gastritis  . Oxycontin [Oxycodone] Itching    Would take benadryl to eliminate itching. Has had bronchospasm  . Sulfacetamide Rash    Fever, abdominal pain  . Adhesive [Tape] Other (See Comments)    Thin skin. Causes tears. Paper tape okay  . Azithromycin     Unsure. Patient does not remember but thinks that it did not work for her.  . Ciprofloxacin Other (See Comments)    Severe pain in neck and down back   . Ketorolac Itching  . Nitrofurantoin Diarrhea    Tried again  and had no problems with it  . Sulfa Antibiotics Itching, Rash and Other (See Comments)    fever  . Tolmetin     Other reaction(s): Other (See Comments) Bloody stools, abdominal pain History of chronic gastritis  . Zofran [Ondansetron Hcl] Other  (See Comments)    constipation    Past Medical History:  Diagnosis Date  . Arthritis    osteo  . Asthma    needs rescue inhaler few times a year  . Depression   . Dizziness    light headed spells but it has been a while  . Dysrhythmia    tachycardia.(cardizem). brady during procedures  . GERD (gastroesophageal reflux disease)    gastritis  . Headache(784.0)   . Heart murmur 2019   aortic. dr. Nehemiah Massed not worried about this  . Hyperlipidemia   . Hypertension   . Hypothyroidism   . Migraine   . Orthopnea   . Pneumonia    long time ago (greater than 5 years ago)  . PONV (postoperative nausea and vomiting)    very anxious during cataract surgery. brady during colonoscopy  . Shortness of breath    any exertion, cannot lie flat    Past Surgical History:  Procedure Laterality Date  . 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 BIOPSY THYROID  2018  . bladder biopsies  2003   9 biopsies done by dr. cope.  all benign.  inflammatory process going on in bladder  . BREAST BIOPSY Right    benign  . BREAST SURGERY Right    lumpectomy  . CATARACT EXTRACTION W/PHACO Left 12/25/2014   Procedure: CATARACT EXTRACTION PHACO AND INTRAOCULAR LENS PLACEMENT (IOC);  Surgeon: Birder Robson, MD;  Location: [facility] ORS;  Service: Ophthalmology;  Laterality: Left;  [place] 00:32 AP% 20.0 CDE 6.49  . COLONOSCOPY W/ BIOPSIES    . COLONOSCOPY W/ POLYPECTOMY  2002, 2004, 2005   adenomatous polyps removed  . COLONOSCOPY WITH PROPOFOL N/A 08/23/2017   Procedure: COLONOSCOPY WITH PROPOFOL;  Surgeon: Manya Silvas, MD;  Location: [facility] ENDOSCOPY;  Service: Endoscopy;  Laterality: N/A;  . COLONOSCOPY WITH PROPOFOL N/A 11/08/2017   Procedure: COLONOSCOPY WITH PROPOFOL;  Surgeon: Manya Silvas, MD;  Location: [facility] ENDOSCOPY;  Service: Endoscopy;  Laterality: N/A;  . CYSTOCELE REPAIR N/A 04/09/2016   Procedure: ANTERIOR REPAIR (CYSTOCELE);  Surgeon:  Gae Dry, MD;  Location: [facility] ORS;  Service: Gynecology;  Laterality: N/A;  . DIAGNOSTIC LAPAROSCOPY  2008   removed both ovaries with cysts, tubes and fibroids  . DILATION AND CURETTAGE OF UTERUS  1973  . ESOPHAGOGASTRODUODENOSCOPY (EGD) WITH PROPOFOL N/A 08/23/2017   Procedure: ESOPHAGOGASTRODUODENOSCOPY (EGD) WITH PROPOFOL;  Surgeon: Manya Silvas, MD;  Location: [facility]COPY;  Service: Endoscopy;  Laterality: N/A;  . EYE SURGERY Bilateral 2016  . HERNIA REPAIR Right 2008   Inguinal hernia Repair, ventral hernia repair  . JOINT REPLACEMENT Right 2008   knee  . KNEE ARTHROSCOPY Right   . LUMBAR LAMINECTOMY/DECOMPRESSION MICRODISCECTOMY Right 03/21/2013   Procedure: Right Lumbar five-Sacral one Laminectomy for Synovial Cyst;  Surgeon: Faythe Ghee, MD;  Location: [facility] NEURO ORS;  Service: Neurosurgery;  Laterality: Right;  right  . OOPHORECTOMY    . REVERSE SHOULDER ARTHROPLASTY Right 02/22/2018   Procedure: REVERSE SHOULDER ARTHROPLASTY;  Surgeon: Corky Mull, MD;  Location: [facility] ORS;  Service: Orthopedics;  Laterality: Right;  . TUBAL LIGATION    .  UNILATERAL SALPINGECTOMY Left 04/09/2016   Procedure: UNILATERAL SALPINGECTOMY;  Surgeon: Gae Dry, MD;  Location: [facility] ORS;  Service: Gynecology;  Laterality: Left;  . UPPER GI ENDOSCOPY  2010   with biopsy of gastric erosion  . VAGINAL HYSTERECTOMY N/A 04/09/2016   Procedure: HYSTERECTOMY VAGINAL;  Surgeon: Gae Dry, MD;  Location: [facility] ORS;  Service: Gynecology;  Laterality: N/A;  . VAGINAL HYSTERECTOMY  2017   and bladder tack    Social History   Socioeconomic History  . Marital status: Married    Spouse name: Not on file  . Number of children: Not on file  . Years of education: Not on file  . Highest education level: Not on file  Occupational History  . Not on file  Social Needs  . Financial resource strain: Not on file  . Food insecurity:    Worry: Not on file    Inability: Not on file  .  Transportation needs:    Medical: Not on file    Non-medical: Not on file  Tobacco Use  . Smoking status: Former Smoker    Types: Cigarettes    Last attempt to quit: 1967    Years since quitting: 53.4  . Smokeless tobacco: Never Used  Substance and Sexual Activity  . Alcohol use: Not Currently  . Drug use: No  . Sexual activity: Not Currently  Lifestyle  . Physical activity:    Days per week: Not on file    Minutes per session: Not on file  . Stress: Not on file  Relationships  . Social connections:    Talks on phone: Not on file    Gets together: Not on file    Attends religious service: Not on file    Active member of club or organization: Not on file    Attends meetings of clubs or organizations: Not on file    Relationship status: Not on file  . Intimate partner violence:    Fear of current or ex partner: Not on file    Emotionally abused: Not on file    Physically abused: Not on file    Forced sexual activity: Not on file  Other Topics Concern  . Not on file  Social History Narrative  . Not on file    Family History  Problem Relation Age of Onset  . Pulmonary fibrosis Mother   . Melanoma Father   . Cardiomyopathy Sister        and arrhythmia  . Breast cancer Cousin        paternal 1st     Current Outpatient Medications:  .  acetaminophen (TYLENOL) 500 MG tablet, Take 1,000 mg by mouth 2 (two) times daily as needed for moderate pain., Disp: , Rfl:  .  albuterol (PROVENTIL) (2.5 MG/3ML) 0.083% nebulizer solution, Take 3 mLs (2.5 mg total) by nebulization every 6 (six) hours as needed for wheezing or shortness of breath., Disp: 75 mL, Rfl: 0 .  albuterol (VENTOLIN HFA) 108 (90 Base) MCG/ACT inhaler, Inhale 2 puffs into the lungs every 4 (four) hours as needed for wheezing or shortness of breath., Disp: 18 g, Rfl: 5 .  aspirin EC 81 MG tablet, Take 81 mg by mouth at bedtime. , Disp: , Rfl:  .  benzonatate (TESSALON) 100 MG capsule, Take 2 capsules (200 mg total)  by mouth 2 (two) times daily as needed for cough., Disp: 60 capsule, Rfl: 3 .  BLACK ELDERBERRY PO, Take 1 Dose by mouth 2 (two) times  a day., Disp: , Rfl:  .  Calcium Carbonate-Vitamin D (CALCIUM 600+D) 600-400 MG-UNIT tablet, Take 1 tablet by mouth 2 (two) times daily., Disp: , Rfl:  .  cholecalciferol (VITAMIN D) 1000 UNITS tablet, Take 1,000 Units by mouth at bedtime. , Disp: , Rfl:  .  citalopram (CELEXA) 10 MG tablet, Take 1 tablet (10 mg total) by mouth at bedtime., Disp: 30 tablet, Rfl: 5 .  cyanocobalamin 500 MCG tablet, Take 500 mcg by mouth daily., Disp: , Rfl:  .  diltiazem (CARDIZEM CD) 120 MG 24 hr capsule, Take 120 mg by mouth at bedtime. , Disp: , Rfl: 3 .  diphenhydrAMINE (BENADRYL) 25 MG tablet, Take 25-50 mg by mouth daily as needed for itching., Disp: , Rfl:  .  docusate sodium (COLACE) 100 MG capsule, Take 200 mg by mouth at bedtime as needed for moderate constipation. , Disp: , Rfl:  .  EPIPEN 2-PAK 0.3 MG/0.3ML SOAJ injection, use as directed for severe allergy reaction, Disp: 2 Device, Rfl: 0 .  estradiol (ESTRACE VAGINAL) 0.1 MG/GM vaginal cream, Apply a pea-sized amount to urethra at bedtime, Disp: 42.5 g, Rfl: 5 .  fenofibrate 160 MG tablet, take1 tab po daily for chol, Disp: 30 tablet, Rfl: 5 .  folic acid (FOLVITE) 462 MCG tablet, Take 400 mcg by mouth daily., Disp: , Rfl:  .  guaiFENesin (MUCINEX) 600 MG 12 hr tablet, Take 600 mg by mouth at bedtime as needed (congestion)., Disp: , Rfl:  .  Hypromellose (ARTIFICIAL TEARS OP), Place 1-2 drops into both eyes daily as needed (dry eyes)., Disp: , Rfl:  .  ketotifen (ZADITOR) 0.025 % ophthalmic solution, Place 1 drop into both eyes 2 (two) times daily as needed (allergies)., Disp: , Rfl:  .  levocetirizine (XYZAL) 5 MG tablet, Take 1 tablet (5 mg total) by mouth every evening., Disp: 30 tablet, Rfl: 5 .  levothyroxine (SYNTHROID, LEVOTHROID) 50 MCG tablet, Take 50 mcg by mouth daily before breakfast. Brand Name only, Disp: ,  Rfl:  .  Magnesium 250 MG TABS, Take 1 tablet by mouth daily., Disp: , Rfl:  .  Melatonin 5 MG TABS, Take 1 tablet by mouth at bedtime. For sleep , Disp: , Rfl:  .  Multiple Vitamins-Minerals (MULTIVITAMIN WITH MINERALS) tablet, Take 1 tablet by mouth daily., Disp: , Rfl:  .  pantoprazole (PROTONIX) 40 MG tablet, Take 40 mg by mouth 2 (two) times daily., Disp: , Rfl:  .  Phenylephrine-guaiFENesin 10-400 MG TABS, Take 1 tablet by mouth every 4 (four) hours as needed (congestion)., Disp: , Rfl:  .  pyridoxine (B-6) 100 MG tablet, Take 100 mg by mouth daily., Disp: , Rfl:  .  sodium chloride (OCEAN) 0.65 % SOLN nasal spray, Place 1 spray into both nostrils 2 (two) times daily., Disp: , Rfl:  .  zinc gluconate 50 MG tablet, Take 50 mg by mouth daily., Disp: , Rfl:  .  Diphenhyd-Hydrocort-Nystatin (FIRST-DUKES MOUTHWASH) SUSP, Swish and swallow 69mls QID prn sore throat. (Patient not taking: Reported on 01/09/2019), Disp: 250 mL, Rfl: 0  No results found.  No images are attached to the encounter.   CMP Latest Ref Rng & Units 04/26/2018  Glucose 65 - 99 mg/dL 99  BUN 8 - 27 mg/dL 16  Creatinine 0.57 - 1.00 mg/dL 0.57  Sodium 134 - 144 mmol/L 138  Potassium 3.5 - 5.2 mmol/L 4.0  Chloride 96 - 106 mmol/L 102  CO2 20 - 29 mmol/L 20  Calcium 8.7 -  10.3 mg/dL 9.4  Total Protein 6.0 - 8.5 g/dL 6.3  Total Bilirubin 0.0 - 1.2 mg/dL 0.2  Alkaline Phos 39 - 117 IU/L 64  AST 0 - 40 IU/L 21  ALT 0 - 32 IU/L 17   CBC Latest Ref Rng & Units 01/06/2019  WBC 4.0 - 10.5 K/uL 4.9  Hemoglobin 12.0 - 15.0 g/dL 12.3  Hematocrit 36.0 - 46.0 % 38.4  Platelets 150 - 400 K/uL 314     Assessment and plan: Patient is a 73 year old female with an iron deficiency anemia of unclear etiology  Hemoglobin is 12.3 today.  Iron studies are normal.  She does not require IV iron at this time I do not think that her fatigue is secondary to iron deficiency.  She continues to follow-up with cardiology clinic  GI.   Follow-up instructions: Repeat CBC ferritin and iron studies in 3 in 6 months and I will see him back in 6 months  I discussed the assessment and treatment plan with the patient. The patient was provided an opportunity to ask questions and all were answered. The patient agreed with the plan and demonstrated an understanding of the instructions.   The patient was advised to call back or seek an in-person evaluation if the symptoms worsen or if the condition fails to improve as anticipated.  I provided 16 minutes of non face-to-face telephone visit time during this encounter, and > 50% was spent counseling as documented under my assessment & plan.  Visit Diagnosis: 1. Iron deficiency anemia, unspecified iron deficiency anemia type     Dr. Randa Evens, MD, MPH [facility] at [facility] Pager- [phone] 01/10/2019 8:42 AM

## 2019-01-23 DIAGNOSIS — H353211 Exudative age-related macular degeneration, right eye, with active choroidal neovascularization: Secondary | ICD-10-CM | POA: Diagnosis not present

## 2019-02-06 ENCOUNTER — Other Ambulatory Visit: Payer: Self-pay

## 2019-02-06 MED ORDER — EPINEPHRINE 0.3 MG/0.3ML IJ SOAJ: INTRAMUSCULAR | 1 refills | Status: DC

## 2019-02-10 ENCOUNTER — Other Ambulatory Visit: Payer: Self-pay | Admitting: Nurse Practitioner

## 2019-02-10 DIAGNOSIS — N390 Urinary tract infection, site not specified: Secondary | ICD-10-CM

## 2019-02-10 MED ORDER — AMOXICILLIN-POT CLAVULANATE 500-125 MG PO TABS: 1.0000 | ORAL_TABLET | Freq: Two times a day (BID) | ORAL | 0 refills | Status: DC

## 2019-02-10 NOTE — Progress Notes (Signed)
Patient contacted the office with symptoms of UTI. Sent prescription for augmentin 500mg  to take twice daily for 10 days to [facility].

## 2019-02-20 DIAGNOSIS — R0600 Dyspnea, unspecified: Secondary | ICD-10-CM | POA: Diagnosis not present

## 2019-02-20 DIAGNOSIS — E782 Mixed hyperlipidemia: Secondary | ICD-10-CM | POA: Diagnosis not present

## 2019-02-20 DIAGNOSIS — R5383 Other fatigue: Secondary | ICD-10-CM | POA: Diagnosis not present

## 2019-02-20 DIAGNOSIS — I10 Essential (primary) hypertension: Secondary | ICD-10-CM | POA: Diagnosis not present

## 2019-02-20 DIAGNOSIS — I471 Supraventricular tachycardia: Secondary | ICD-10-CM | POA: Diagnosis not present

## 2019-02-20 DIAGNOSIS — I493 Ventricular premature depolarization: Secondary | ICD-10-CM | POA: Diagnosis not present

## 2019-02-20 DIAGNOSIS — I700 Atherosclerosis of aorta: Secondary | ICD-10-CM | POA: Diagnosis not present

## 2019-03-03 DIAGNOSIS — R0600 Dyspnea, unspecified: Secondary | ICD-10-CM | POA: Diagnosis not present

## 2019-03-03 DIAGNOSIS — R5383 Other fatigue: Secondary | ICD-10-CM | POA: Diagnosis not present

## 2019-03-06 ENCOUNTER — Ambulatory Visit (INDEPENDENT_AMBULATORY_CARE_PROVIDER_SITE_OTHER): Payer: Medicare Other | Admitting: Internal Medicine

## 2019-03-06 ENCOUNTER — Encounter: Payer: Self-pay | Admitting: Internal Medicine

## 2019-03-06 ENCOUNTER — Other Ambulatory Visit: Payer: Self-pay

## 2019-03-06 VITALS — BP 126/64 | HR 82 | Resp 16 | Ht 61.0 in | Wt 140.0 lb

## 2019-03-06 DIAGNOSIS — J4520 Mild intermittent asthma, uncomplicated: Secondary | ICD-10-CM

## 2019-03-06 DIAGNOSIS — R079 Chest pain, unspecified: Secondary | ICD-10-CM | POA: Diagnosis not present

## 2019-03-06 DIAGNOSIS — R0602 Shortness of breath: Secondary | ICD-10-CM | POA: Diagnosis not present

## 2019-03-06 DIAGNOSIS — J301 Allergic rhinitis due to pollen: Secondary | ICD-10-CM | POA: Diagnosis not present

## 2019-03-06 NOTE — Progress Notes (Signed)
[facility] [place], [place] [address]  Pulmonary Sleep Medicine   Office Visit Note  Patient Name: Vanessa Evans DOB: [birth year] MRN [id number]  Date of Service: 03/06/2019  Complaints/HPI: She is doing well overall. She has not had any exacerbation of her symptoms. She has nt had any allergy shots since March due to pandemic She states overall her symptoms have been minimal. She uses an allergy pill when she needs to. No admission to the hospistal and no contacts with any known covid patients  ROS  General: (-) fever, (-) chills, (-) night sweats, (-) weakness Skin: (-) rashes, (-) itching,. Eyes: (-) visual changes, (-) redness, (-) itching. Nose and Sinuses: (-) nasal stuffiness or itchiness, (-) postnasal drip, (-) nosebleeds, (-) sinus trouble. Mouth and Throat: (-) sore throat, (-) hoarseness. Neck: (-) swollen glands, (-) enlarged thyroid, (-) neck pain. Respiratory: - cough, (-) bloody sputum, + shortness of breath, - wheezing. Cardiovascular: - ankle swelling, (-) chest pain. Lymphatic: (-) lymph node enlargement. Neurologic: (-) numbness, (-) tingling. Psychiatric: (-) anxiety, (-) depression   Current Medication: Outpatient Encounter Medications as of 03/06/2019  Medication Sig  . acetaminophen (TYLENOL) 500 MG tablet Take 1,000 mg by mouth 2 (two) times daily as needed for moderate pain.  Marland Kitchen albuterol (PROVENTIL) (2.5 MG/3ML) 0.083% nebulizer solution Take 3 mLs (2.5 mg total) by nebulization every 6 (six) hours as needed for wheezing or shortness of breath.  Marland Kitchen albuterol (VENTOLIN HFA) 108 (90 Base) MCG/ACT inhaler Inhale 2 puffs into the lungs every 4 (four) hours as needed for wheezing or shortness of breath.  Marland Kitchen amoxicillin-clavulanate (AUGMENTIN) 500-125 MG tablet Take 1 tablet (500 mg total) by mouth 2 (two) times daily.  Marland Kitchen aspirin EC 81 MG tablet Take 81 mg by mouth at bedtime.   . benzonatate (TESSALON) 100 MG capsule Take 2  capsules (200 mg total) by mouth 2 (two) times daily as needed for cough.  Marland Kitchen BLACK ELDERBERRY PO Take 1 Dose by mouth 2 (two) times a day.  . Calcium Carbonate-Vitamin D (CALCIUM 600+D) 600-400 MG-UNIT tablet Take 1 tablet by mouth 2 (two) times daily.  . cholecalciferol (VITAMIN D) 1000 UNITS tablet Take 1,000 Units by mouth at bedtime.   . citalopram (CELEXA) 10 MG tablet Take 1 tablet (10 mg total) by mouth at bedtime.  . cyanocobalamin 500 MCG tablet Take 500 mcg by mouth daily.  Marland Kitchen diltiazem (CARDIZEM CD) 120 MG 24 hr capsule Take 120 mg by mouth at bedtime.   . diphenhydrAMINE (BENADRYL) 25 MG tablet Take 25-50 mg by mouth daily as needed for itching.  . docusate sodium (COLACE) 100 MG capsule Take 200 mg by mouth at bedtime as needed for moderate constipation.   Marland Kitchen EPINEPHrine (EPIPEN 2-PAK) 0.3 mg/0.3 mL IJ SOAJ injection use as directed for severe allergy reaction  . estradiol (ESTRACE VAGINAL) 0.1 MG/GM vaginal cream Apply a pea-sized amount to urethra at bedtime  . fenofibrate 160 MG tablet take1 tab po daily for chol  . folic acid (FOLVITE) 053 MCG tablet Take 400 mcg by mouth daily.  Marland Kitchen guaiFENesin (MUCINEX) 600 MG 12 hr tablet Take 600 mg by mouth at bedtime as needed (congestion).  . Hypromellose (ARTIFICIAL TEARS OP) Place 1-2 drops into both eyes daily as needed (dry eyes).  Marland Kitchen ketotifen (ZADITOR) 0.025 % ophthalmic solution Place 1 drop into both eyes 2 (two) times daily as needed (allergies).  Marland Kitchen levocetirizine (XYZAL) 5 MG tablet Take 1 tablet (5 mg  total) by mouth every evening.  Marland Kitchen levothyroxine (SYNTHROID, LEVOTHROID) 50 MCG tablet Take 50 mcg by mouth daily before breakfast. Brand Name only  . Magnesium 250 MG TABS Take 1 tablet by mouth daily.  . Melatonin 5 MG TABS Take 1 tablet by mouth at bedtime. For sleep   . Multiple Vitamins-Minerals (MULTIVITAMIN WITH MINERALS) tablet Take 1 tablet by mouth daily.  . pantoprazole (PROTONIX) 40 MG tablet Take 40 mg by mouth 2 (two)  times daily.  Marland Kitchen Phenylephrine-guaiFENesin 10-400 MG TABS Take 1 tablet by mouth every 4 (four) hours as needed (congestion).  . pyridoxine (B-6) 100 MG tablet Take 100 mg by mouth daily.  . sodium chloride (OCEAN) 0.65 % SOLN nasal spray Place 1 spray into both nostrils 2 (two) times daily.  Marland Kitchen zinc gluconate 50 MG tablet Take 50 mg by mouth daily.  . [DISCONTINUED] Diphenhyd-Hydrocort-Nystatin (FIRST-DUKES MOUTHWASH) SUSP Swish and swallow 67mls QID prn sore throat. (Patient not taking: Reported on 01/09/2019)   No facility-administered encounter medications on file as of 03/06/2019.     Surgical History: Past Surgical History:  Procedure Laterality Date  . 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 BIOPSY THYROID  2018  . bladder biopsies  2003   9 biopsies done by dr. cope.  all benign.  inflammatory process going on in bladder  . BREAST BIOPSY Right    benign  . BREAST SURGERY Right    lumpectomy  . CATARACT EXTRACTION W/PHACO Left 12/25/2014   Procedure: CATARACT EXTRACTION PHACO AND INTRAOCULAR LENS PLACEMENT (IOC);  Surgeon: Birder Robson, MD;  Location: [facility] ORS;  Service: Ophthalmology;  Laterality: Left;  [place] 00:32 AP% 20.0 CDE 6.49  . COLONOSCOPY W/ BIOPSIES    . COLONOSCOPY W/ POLYPECTOMY  2002, 2004, 2005   adenomatous polyps removed  . COLONOSCOPY WITH PROPOFOL N/A 08/23/2017   Procedure: COLONOSCOPY WITH PROPOFOL;  Surgeon: Manya Silvas, MD;  Location: [facility] ENDOSCOPY;  Service: Endoscopy;  Laterality: N/A;  . COLONOSCOPY WITH PROPOFOL N/A 11/08/2017   Procedure: COLONOSCOPY WITH PROPOFOL;  Surgeon: Manya Silvas, MD;  Location: [facility] ENDOSCOPY;  Service: Endoscopy;  Laterality: N/A;  . CYSTOCELE REPAIR N/A 04/09/2016   Procedure: ANTERIOR REPAIR (CYSTOCELE);  Surgeon: Gae Dry, MD;  Location: [facility] ORS;  Service: Gynecology;  Laterality: N/A;  . DIAGNOSTIC LAPAROSCOPY  2008   removed both ovaries with cysts,  tubes and fibroids  . DILATION AND CURETTAGE OF UTERUS  1973  . ESOPHAGOGASTRODUODENOSCOPY (EGD) WITH PROPOFOL N/A 08/23/2017   Procedure: ESOPHAGOGASTRODUODENOSCOPY (EGD) WITH PROPOFOL;  Surgeon: Manya Silvas, MD;  Location: [facility] ENDOSCOPY;  Service: Endoscopy;  Laterality: N/A;  . EYE SURGERY Bilateral 2016  . HERNIA REPAIR Right 2008   Inguinal hernia Repair, ventral hernia repair  . JOINT REPLACEMENT Right 2008   knee  . KNEE ARTHROSCOPY Right   . LUMBAR LAMINECTOMY/DECOMPRESSION MICRODISCECTOMY Right 03/21/2013   Procedure: Right Lumbar five-Sacral one Laminectomy for Synovial Cyst;  Surgeon: Faythe Ghee, MD;  Location: [facility] NEURO ORS;  Service: Neurosurgery;  Laterality: Right;  right  . OOPHORECTOMY    . REVERSE SHOULDER ARTHROPLASTY Right 02/22/2018   Procedure: REVERSE SHOULDER ARTHROPLASTY;  Surgeon: Corky Mull, MD;  Location: [facility] ORS;  Service: Orthopedics;  Laterality: Right;  . TUBAL LIGATION    . UNILATERAL SALPINGECTOMY Left 04/09/2016   Procedure: UNILATERAL SALPINGECTOMY;  Surgeon: Gae Dry, MD;  Location: [facility] ORS;  Service: Gynecology;  Laterality: Left;  . UPPER GI ENDOSCOPY  2010   with biopsy of gastric erosion  . VAGINAL HYSTERECTOMY N/A 04/09/2016   Procedure: HYSTERECTOMY VAGINAL;  Surgeon: Gae Dry, MD;  Location: [facility] ORS;  Service: Gynecology;  Laterality: N/A;  . VAGINAL HYSTERECTOMY  2017   and bladder tack    Medical History: Past Medical History:  Diagnosis Date  . Arthritis    osteo  . Asthma    needs rescue inhaler few times a year  . Depression   . Dizziness    light headed spells but it has been a while  . Dysrhythmia    tachycardia.(cardizem). brady during procedures  . GERD (gastroesophageal reflux disease)    gastritis  . Headache(784.0)   . Heart murmur 2019   aortic. dr. Nehemiah Massed not worried about this  . Hyperlipidemia   . Hypertension   . Hypothyroidism   . Migraine   . Orthopnea   . Pneumonia    long time  ago (greater than 5 years ago)  . PONV (postoperative nausea and vomiting)    very anxious during cataract surgery. brady during colonoscopy  . Shortness of breath    any exertion, cannot lie flat    Family History: Family History  Problem Relation Age of Onset  . Pulmonary fibrosis Mother   . Melanoma Father   . Cardiomyopathy Sister        and arrhythmia  . Breast cancer Cousin        paternal 1st    Social History: Social History   Socioeconomic History  . Marital status: Married    Spouse name: Not on file  . Number of children: Not on file  . Years of education: Not on file  . Highest education level: Not on file  Occupational History  . Not on file  Social Needs  . Financial resource Evans: Not on file  . Food insecurity    Worry: Not on file    Inability: Not on file  . Transportation needs    Medical: Not on file    Non-medical: Not on file  Tobacco Use  . Smoking status: Former Smoker    Types: Cigarettes    Quit date: 1967    Years since quitting: 53.6  . Smokeless tobacco: Never Used  Substance and Sexual Activity  . Alcohol use: Not Currently  . Drug use: No  . Sexual activity: Not Currently  Lifestyle  . Physical activity    Days per week: Not on file    Minutes per session: Not on file  . Stress: Not on file  Relationships  . Social Herbalist on phone: Not on file    Gets together: Not on file    Attends religious service: Not on file    Active member of club or organization: Not on file    Attends meetings of clubs or organizations: Not on file    Relationship status: Not on file  . Intimate partner violence    Fear of current or ex partner: Not on file    Emotionally abused: Not on file    Physically abused: Not on file    Forced sexual activity: Not on file  Other Topics Concern  . Not on file  Social History Narrative  . Not on file    Vital Signs: Blood pressure 126/64, pulse 82, resp. rate 16, height 5\' 1"  (1.549  m), weight 140 lb (63.5 kg), SpO2 97 %.  Examination: General Appearance: The patient is well-developed, well-nourished, and in no distress. Skin: Gross inspection of skin unremarkable. Head: normocephalic, no gross deformities. Eyes: no gross deformities noted. ENT: ears appear grossly normal no exudates. Neck: Supple. No thyromegaly. No LAD. Respiratory: no rhonchi noted at this time. Cardiovascular: Normal S1 and S2 without murmur or rub. Extremities: No cyanosis. pulses are equal. Neurologic: Alert and oriented. No involuntary movements.  LABS: Recent Results (from the past 2160 hour(s))  Iron and TIBC     Status: None   Collection Time: 01/06/19 10:41 AM  Result Value Ref Range   Iron 87 28 - 170 ug/dL   TIBC 444 250 - 450 ug/dL   Saturation Ratios 20 10.4 - 31.8 %   UIBC 357 ug/dL    Comment: Performed at [facility], [address] [place], [place] [address]  Ferritin     Status: None   Collection Time: 01/06/19 10:41 AM  Result Value Ref Range   Ferritin 126 11 - 307 ng/mL    Comment: Performed at [facility], [address] [place], [place] [address]  CBC     Status: None   Collection Time: 01/06/19 10:41 AM  Result Value Ref Range   WBC 4.9 4.0 - 10.5 K/uL   RBC 4.13 3.87 - 5.11 MIL/uL   Hemoglobin 12.3 12.0 - 15.0 g/dL   HCT 38.4 36.0 - 46.0 %   MCV 93.0 80.0 - 100.0 fL   MCH 29.8 26.0 - 34.0 pg   MCHC 32.0 30.0 - 36.0 g/dL   RDW 12.3 11.5 - 15.5 %   Platelets 314 150 - 400 K/uL   nRBC 0.0 0.0 - 0.2 %    Comment: Performed at [facility], [address] [place], [place] [address]    Radiology: Mr Lumbar Spine Wo Contrast  Result Date: 09/30/2018 CLINICAL DATA:  Chronic and worsening low back and coccygeal pain. History lumbar surgery 03/21/2013. EXAM: MRI LUMBAR SPINE WITHOUT CONTRAST TECHNIQUE: Multiplanar, multisequence MR imaging of the lumbar spine was performed. No intravenous contrast was administered. COMPARISON:  CT  abdomen and pelvis 12/15/2017. FINDINGS: Segmentation:  Standard. Alignment: 0.8 cm anterolisthesis L5 on S1 is due to a unilateral pars interarticularis defect on the right and facet arthropathy on left and unchanged. Alignment is otherwise maintained. Vertebrae: No fracture or worrisome lesion. Degenerative endplate signal change is most notable at L3-4. Conus medullaris and cauda equina: Conus extends to the L1 level. Conus and cauda equina appear normal. Paraspinal and other soft tissues: Negative. Disc levels: T10-11 and T11-12 are imaged in the sagittal plane only and negative. T12-L1: Negative. L1-2: Negative. L2-3: Mild facet degenerative change and a minimal bulge. No stenosis. L3-4: There is loss of disc space height with a shallow broad-based disc bulge, ligamentum flavum thickening and mild facet arthropathy. Mild central canal narrowing is seen. The foramina are open. L4-5: Shallow disc bulge, ligamentum flavum thickening and mild to moderate facet arthropathy. There is mild central canal stenosis and mild narrowing in the right subarticular recess. The foramina are open. L5-S1: Right laminotomy defect is identified. The disc is uncovered without bulging. The central canal and foramina are open. IMPRESSION: Mild central canal stenosis at L3-4 due to ligamentum flavum thickening and a shallow disc bulge. Mild central canal and right subarticular recess narrowing at L4-5 due to a shallow disc bulge and ligamentum flavum thickening. Status post right laminotomy at L5-S1. The central canal and foramina are open. 0.8 cm anterolisthesis L5  on S1 due to facet arthropathy on the left and a unilateral pars interarticularis defect on the right is unchanged. Electronically Signed   By: Inge Rise M.D.   On: 09/30/2018 11:20    No results found.  No results found.    Assessment and Plan: Patient Active Problem List   Diagnosis Date Noted  . Mild intermittent asthma without complication 42/70/6237   . Seasonal allergic rhinitis due to pollen 08/22/2018  . Chills with fever 08/11/2018  . Sore throat 08/11/2018  . Acute upper respiratory infection 08/11/2018  . Candidiasis 08/11/2018  . Iron deficiency anemia 05/05/2018  . Right hip pain 04/27/2018  . Accidental fall from furniture 04/27/2018  . Vitamin D deficiency 04/27/2018  . Cough 04/18/2018  . Dysuria 04/18/2018  . Status post reverse total shoulder replacement, right 02/22/2018  . Abnormal ECG 02/18/2018  . Pain in joint of right shoulder 02/13/2018  . Abdominal aortic atherosclerosis ([place]) 02/13/2018  . Episode of recurrent major depressive disorder ([place]) 02/13/2018  . Encounter for general adult medical examination with abnormal findings 02/13/2018  . Lipoma of right shoulder 01/31/2018  . Rotator cuff tendinitis, right 01/31/2018  . Chronic superficial gastritis without bleeding 12/12/2017  . Schatzki's ring 12/12/2017  . Acute gastritis 11/08/2017  . Nausea 10/18/2017  . Weakness generalized 10/18/2017  . Allergic rhinitis 09/20/2017  . Eczema 09/20/2017  . Hematuria 09/20/2017  . Mixed hyperlipidemia 09/20/2017  . Osteoarthritis 09/20/2017  . Osteoporosis, post-menopausal 09/20/2017  . Palpitations 07/05/2017  . Epigastric pain 05/24/2017  . Gastroesophageal reflux disease without esophagitis 05/24/2017  . Impingement syndrome of left shoulder 10/02/2016  . Trochanteric bursitis of left hip 10/02/2016  . SOB (shortness of breath) 04/16/2016  . Uterine prolapse 04/09/2016  . Cystocele 04/09/2016  . Hyponatremia 04/09/2016  . Benign essential HTN 11/18/2015  . Paroxysmal supraventricular tachycardia ([place]) 11/18/2015  . Premature ventricular contraction 07/20/2014  . History of colonic polyps 05/29/2014  . Asthma 02/05/2014  . Chest pain 02/05/2014  . Increased frequency of urination 02/05/2014  . Tachycardia 02/05/2014  . Female stress incontinence 12/14/2013  . Gross hematuria 12/14/2013  . Incomplete  emptying of bladder 12/14/2013  . Other chronic cystitis without hematuria 12/14/2013    1. Allergic rhinitis seems to be in good control at this time .She will hold her allergy shots for now per her request. She takes prn allergy medications as needed 2. Asthma under control at this time. Will need follow up PFT at some point after pandemic is over. 3. Chest pain no pain noted 4. Concern for IPF she has family history and was counseled again. Last Ct was in 2018 which was normal  General Counseling: I have discussed the findings of the evaluation and examination with Rosalea.  I have also discussed any further diagnostic evaluation thatmay be needed or ordered today. Stormey verbalizes understanding of the findings of todays visit. We also reviewed her medications today and discussed drug interactions and side effects including but not limited excessive drowsiness and altered mental states. We also discussed that there is always a risk not just to her but also people around her. she has been encouraged to call the office with any questions or concerns that should arise related to todays visit.    Time spent: 8min  I have personally obtained a history, examined the patient, evaluated laboratory and imaging results, formulated the assessment and plan and placed orders.    Allyne Gee, MD [facility] Pulmonary and Critical Care Sleep medicine

## 2019-03-14 DIAGNOSIS — I10 Essential (primary) hypertension: Secondary | ICD-10-CM | POA: Diagnosis not present

## 2019-03-14 DIAGNOSIS — E782 Mixed hyperlipidemia: Secondary | ICD-10-CM | POA: Diagnosis not present

## 2019-03-14 DIAGNOSIS — I493 Ventricular premature depolarization: Secondary | ICD-10-CM | POA: Diagnosis not present

## 2019-03-14 DIAGNOSIS — R5383 Other fatigue: Secondary | ICD-10-CM | POA: Diagnosis not present

## 2019-03-14 DIAGNOSIS — R531 Weakness: Secondary | ICD-10-CM | POA: Diagnosis not present

## 2019-03-14 DIAGNOSIS — I471 Supraventricular tachycardia: Secondary | ICD-10-CM | POA: Diagnosis not present

## 2019-03-14 DIAGNOSIS — I700 Atherosclerosis of aorta: Secondary | ICD-10-CM | POA: Diagnosis not present

## 2019-03-21 DIAGNOSIS — R7303 Prediabetes: Secondary | ICD-10-CM | POA: Diagnosis not present

## 2019-03-21 DIAGNOSIS — E042 Nontoxic multinodular goiter: Secondary | ICD-10-CM | POA: Diagnosis not present

## 2019-03-21 DIAGNOSIS — E039 Hypothyroidism, unspecified: Secondary | ICD-10-CM | POA: Diagnosis not present

## 2019-03-28 ENCOUNTER — Ambulatory Visit: Payer: Medicare Other | Admitting: Nurse Practitioner

## 2019-04-05 ENCOUNTER — Other Ambulatory Visit: Payer: Self-pay

## 2019-04-05 MED ORDER — DICLOFENAC SODIUM 1 % TD GEL: 4.0000 g | Freq: Four times a day (QID) | TRANSDERMAL | 2 refills | Status: DC

## 2019-04-05 MED ORDER — DICLOFENAC SODIUM 1 % TD GEL: 4.0000 g | Freq: Four times a day (QID) | TRANSDERMAL | 2 refills | Status: AC

## 2019-04-10 ENCOUNTER — Other Ambulatory Visit: Payer: Self-pay

## 2019-04-11 ENCOUNTER — Other Ambulatory Visit: Payer: Self-pay

## 2019-04-11 ENCOUNTER — Inpatient Hospital Stay: Payer: Medicare Other | Attending: Oncology

## 2019-04-11 DIAGNOSIS — D509 Iron deficiency anemia, unspecified: Secondary | ICD-10-CM | POA: Diagnosis not present

## 2019-04-11 LAB — CBC WITH DIFFERENTIAL/PLATELET
Abs Immature Granulocytes: 0.03 10*3/uL (ref 0.00–0.07)
Basophils Absolute: 0 10*3/uL (ref 0.0–0.1)
Basophils Relative: 1 %
Eosinophils Absolute: 0.1 10*3/uL (ref 0.0–0.5)
Eosinophils Relative: 2 %
HCT: 38.9 % (ref 36.0–46.0)
Hemoglobin: 12.5 g/dL (ref 12.0–15.0)
Immature Granulocytes: 1 %
Lymphocytes Relative: 25 %
Lymphs Abs: 1.5 10*3/uL (ref 0.7–4.0)
MCH: 29.2 pg (ref 26.0–34.0)
MCHC: 32.1 g/dL (ref 30.0–36.0)
MCV: 90.9 fL (ref 80.0–100.0)
Monocytes Absolute: 0.8 10*3/uL (ref 0.1–1.0)
Monocytes Relative: 13 %
Neutro Abs: 3.6 10*3/uL (ref 1.7–7.7)
Neutrophils Relative %: 58 %
Platelets: 272 10*3/uL (ref 150–400)
RBC: 4.28 MIL/uL (ref 3.87–5.11)
RDW: 12.5 % (ref 11.5–15.5)
WBC: 6.1 10*3/uL (ref 4.0–10.5)
nRBC: 0 % (ref 0.0–0.2)

## 2019-04-11 LAB — IRON AND TIBC
Iron: 83 ug/dL (ref 28–170)
Saturation Ratios: 18 % (ref 10.4–31.8)
TIBC: 461 ug/dL — ABNORMAL HIGH (ref 250–450)
UIBC: 378 ug/dL

## 2019-04-11 LAB — FERRITIN: Ferritin: 128 ng/mL (ref 11–307)

## 2019-04-14 ENCOUNTER — Ambulatory Visit (INDEPENDENT_AMBULATORY_CARE_PROVIDER_SITE_OTHER): Payer: Medicare Other | Admitting: Nurse Practitioner

## 2019-04-14 ENCOUNTER — Encounter: Payer: Self-pay | Admitting: Nurse Practitioner

## 2019-04-14 ENCOUNTER — Other Ambulatory Visit: Payer: Self-pay

## 2019-04-14 VITALS — BP 135/73 | HR 77 | Resp 16 | Ht 61.0 in | Wt 139.8 lb

## 2019-04-14 DIAGNOSIS — E039 Hypothyroidism, unspecified: Secondary | ICD-10-CM | POA: Diagnosis not present

## 2019-04-14 DIAGNOSIS — M150 Primary generalized (osteo)arthritis: Secondary | ICD-10-CM | POA: Diagnosis not present

## 2019-04-14 DIAGNOSIS — R739 Hyperglycemia, unspecified: Secondary | ICD-10-CM | POA: Diagnosis not present

## 2019-04-14 DIAGNOSIS — R5383 Other fatigue: Secondary | ICD-10-CM

## 2019-04-14 DIAGNOSIS — M159 Polyosteoarthritis, unspecified: Secondary | ICD-10-CM

## 2019-04-14 DIAGNOSIS — J4520 Mild intermittent asthma, uncomplicated: Secondary | ICD-10-CM

## 2019-04-14 DIAGNOSIS — E042 Nontoxic multinodular goiter: Secondary | ICD-10-CM | POA: Diagnosis not present

## 2019-04-14 DIAGNOSIS — D509 Iron deficiency anemia, unspecified: Secondary | ICD-10-CM | POA: Diagnosis not present

## 2019-04-14 DIAGNOSIS — M8949 Other hypertrophic osteoarthropathy, multiple sites: Secondary | ICD-10-CM

## 2019-04-14 NOTE — Progress Notes (Signed)
[facility], [place] [address]  Internal MEDICINE  Office Visit Note  Patient Name: Vanessa Evans  [id number]  UI:[id number]  Date of Service: 04/26/2019  Chief Complaint  Patient presents with  . Medical Management of Chronic Issues    4 month follow up   . Hyperlipidemia  . Hypertension  . Gastroesophageal Reflux  . Hypothyroidism  . Fatigue    lack of energy still , worsening joint pain and stiffness     The patient is here for routine follow up. She is c/o severe fatigue. Continues to worsen. Did have ferritin levels checked per cancer center. This was done 04/11/2019 and results were normal. She has generalized joint pain. Had been getting injections into her hip to help with chronic bursitis. She was scheduled to have injection into her back due to spondylodesis. She has not had these done in some time due to fear of COVID 19 s(read. She is unable to take anti-inflammatories for pain and does not wish to have any narcotic pain relievers. She does have family history of rheumatological disorders. She is helping to home school her 35 year old granddaughter who is autistic. Patient states that she does not sleep well due to pain and inflammation of the joints. She is scheduled to see her endocrinologist this afternoon.       Current Medication: Outpatient Encounter Medications as of 04/14/2019  Medication Sig  . acetaminophen (TYLENOL) 500 MG tablet Take 1,000 mg by mouth 2 (two) times daily as needed for moderate pain.  Marland Kitchen albuterol (PROVENTIL) (2.5 MG/3ML) 0.083% nebulizer solution Take 3 mLs (2.5 mg total) by nebulization every 6 (six) hours as needed for wheezing or shortness of breath.  Marland Kitchen albuterol (VENTOLIN HFA) 108 (90 Base) MCG/ACT inhaler Inhale 2 puffs into the lungs every 4 (four) hours as needed for wheezing or shortness of breath.  Marland Kitchen aspirin EC 81 MG tablet Take 81 mg by mouth at bedtime.   . benzonatate (TESSALON) 100 MG capsule Take 2  capsules (200 mg total) by mouth 2 (two) times daily as needed for cough.  Marland Kitchen BLACK ELDERBERRY PO Take 1 Dose by mouth 2 (two) times a day.  . Calcium Carbonate-Vitamin D (CALCIUM 600+D) 600-400 MG-UNIT tablet Take 1 tablet by mouth 2 (two) times daily.  . cholecalciferol (VITAMIN D) 1000 UNITS tablet Take 1,000 Units by mouth at bedtime.   . citalopram (CELEXA) 10 MG tablet Take 1 tablet (10 mg total) by mouth at bedtime.  . cyanocobalamin 500 MCG tablet Take 500 mcg by mouth daily.  . diclofenac sodium (VOLTAREN) 1 % GEL Apply 4 g topically 4 (four) times daily.  Marland Kitchen diltiazem (CARDIZEM CD) 120 MG 24 hr capsule Take 120 mg by mouth at bedtime.   . diphenhydrAMINE (BENADRYL) 25 MG tablet Take 25-50 mg by mouth daily as needed for itching.  . docusate sodium (COLACE) 100 MG capsule Take 200 mg by mouth at bedtime as needed for moderate constipation.   Marland Kitchen EPINEPHrine (EPIPEN 2-PAK) 0.3 mg/0.3 mL IJ SOAJ injection use as directed for severe allergy reaction  . estradiol (ESTRACE VAGINAL) 0.1 MG/GM vaginal cream Apply a pea-sized amount to urethra at bedtime  . fenofibrate 160 MG tablet take1 tab po daily for chol  . folic acid (FOLVITE) A999333 MCG tablet Take 400 mcg by mouth daily.  Marland Kitchen guaiFENesin (MUCINEX) 600 MG 12 hr tablet Take 600 mg by mouth at bedtime as needed (congestion).  . Hypromellose (ARTIFICIAL TEARS OP)  Place 1-2 drops into both eyes daily as needed (dry eyes).  Marland Kitchen ketotifen (ZADITOR) 0.025 % ophthalmic solution Place 1 drop into both eyes 2 (two) times daily as needed (allergies).  Marland Kitchen levocetirizine (XYZAL) 5 MG tablet Take 1 tablet (5 mg total) by mouth every evening.  Marland Kitchen levothyroxine (SYNTHROID, LEVOTHROID) 50 MCG tablet Take 50 mcg by mouth daily before breakfast. Brand Name only  . Magnesium 250 MG TABS Take 1 tablet by mouth daily.  . Melatonin 5 MG TABS Take 1 tablet by mouth at bedtime. For sleep   . Multiple Vitamins-Minerals (MULTIVITAMIN WITH MINERALS) tablet Take 1 tablet by  mouth daily.  . pantoprazole (PROTONIX) 40 MG tablet Take 40 mg by mouth 2 (two) times daily.  Marland Kitchen Phenylephrine-guaiFENesin 10-400 MG TABS Take 1 tablet by mouth every 4 (four) hours as needed (congestion).  . pyridoxine (B-6) 100 MG tablet Take 100 mg by mouth daily.  . sodium chloride (OCEAN) 0.65 % SOLN nasal spray Place 1 spray into both nostrils 2 (two) times daily.  Marland Kitchen Specialty Vitamins Products (ECHINACEA C COMPLETE PO) Take by mouth.  . zinc gluconate 50 MG tablet Take 50 mg by mouth daily.  . [DISCONTINUED] amoxicillin-clavulanate (AUGMENTIN) 500-125 MG tablet Take 1 tablet (500 mg total) by mouth 2 (two) times daily. (Patient not taking: Reported on 04/14/2019)   No facility-administered encounter medications on file as of 04/14/2019.     Surgical History: Past Surgical History:  Procedure Laterality Date  . 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 BIOPSY THYROID  2018  . bladder biopsies  2003   9 biopsies done by dr. cope.  all benign.  inflammatory process going on in bladder  . BREAST BIOPSY Right    benign  . BREAST SURGERY Right    lumpectomy  . CATARACT EXTRACTION W/PHACO Left 12/25/2014   Procedure: CATARACT EXTRACTION PHACO AND INTRAOCULAR LENS PLACEMENT (IOC);  Surgeon: Birder Robson, MD;  Location: [facility] ORS;  Service: Ophthalmology;  Laterality: Left;  [place] 00:32 AP% 20.0 CDE 6.49  . COLONOSCOPY W/ BIOPSIES    . COLONOSCOPY W/ POLYPECTOMY  2002, 2004, 2005   adenomatous polyps removed  . COLONOSCOPY WITH PROPOFOL N/A 08/23/2017   Procedure: COLONOSCOPY WITH PROPOFOL;  Surgeon: Manya Silvas, MD;  Location: [facility] ENDOSCOPY;  Service: Endoscopy;  Laterality: N/A;  . COLONOSCOPY WITH PROPOFOL N/A 11/08/2017   Procedure: COLONOSCOPY WITH PROPOFOL;  Surgeon: Manya Silvas, MD;  Location: [facility] ENDOSCOPY;  Service: Endoscopy;  Laterality: N/A;  . CYSTOCELE REPAIR N/A 04/09/2016   Procedure: ANTERIOR REPAIR  (CYSTOCELE);  Surgeon: Gae Dry, MD;  Location: [facility] ORS;  Service: Gynecology;  Laterality: N/A;  . DIAGNOSTIC LAPAROSCOPY  2008   removed both ovaries with cysts, tubes and fibroids  . DILATION AND CURETTAGE OF UTERUS  1973  . ESOPHAGOGASTRODUODENOSCOPY (EGD) WITH PROPOFOL N/A 08/23/2017   Procedure: ESOPHAGOGASTRODUODENOSCOPY (EGD) WITH PROPOFOL;  Surgeon: Manya Silvas, MD;  Location: [facility] ENDOSCOPY;  Service: Endoscopy;  Laterality: N/A;  . EYE SURGERY Bilateral 2016  . HERNIA REPAIR Right 2008   Inguinal hernia Repair, ventral hernia repair  . JOINT REPLACEMENT Right 2008   knee  . KNEE ARTHROSCOPY Right   . LUMBAR LAMINECTOMY/DECOMPRESSION MICRODISCECTOMY Right 03/21/2013   Procedure: Right Lumbar five-Sacral one Laminectomy for Synovial Cyst;  Surgeon: Faythe Ghee, MD;  Location: [facility] NEURO ORS;  Service: Neurosurgery;  Laterality: Right;  right  .  OOPHORECTOMY    . REVERSE SHOULDER ARTHROPLASTY Right 02/22/2018   Procedure: REVERSE SHOULDER ARTHROPLASTY;  Surgeon: Corky Mull, MD;  Location: [facility] ORS;  Service: Orthopedics;  Laterality: Right;  . TUBAL LIGATION    . UNILATERAL SALPINGECTOMY Left 04/09/2016   Procedure: UNILATERAL SALPINGECTOMY;  Surgeon: Gae Dry, MD;  Location: [facility] ORS;  Service: Gynecology;  Laterality: Left;  . UPPER GI ENDOSCOPY  2010   with biopsy of gastric erosion  . VAGINAL HYSTERECTOMY N/A 04/09/2016   Procedure: HYSTERECTOMY VAGINAL;  Surgeon: Gae Dry, MD;  Location: [facility] ORS;  Service: Gynecology;  Laterality: N/A;  . VAGINAL HYSTERECTOMY  2017   and bladder tack    Medical History: Past Medical History:  Diagnosis Date  . Arthritis    osteo  . Asthma    needs rescue inhaler few times a year  . Depression   . Dizziness    light headed spells but it has been a while  . Dysrhythmia    tachycardia.(cardizem). brady during procedures  . GERD (gastroesophageal reflux disease)    gastritis  . Headache(784.0)   . Heart  murmur 2019   aortic. dr. Nehemiah Massed not worried about this  . Hyperlipidemia   . Hypertension   . Hypothyroidism   . Migraine   . Orthopnea   . Pneumonia    long time ago (greater than 5 years ago)  . PONV (postoperative nausea and vomiting)    very anxious during cataract surgery. brady during colonoscopy  . Shortness of breath    any exertion, cannot lie flat    Family History: Family History  Problem Relation Age of Onset  . Pulmonary fibrosis Mother   . Melanoma Father   . Cardiomyopathy Sister        and arrhythmia  . Breast cancer Cousin        paternal 1st    Social History   Socioeconomic History  . Marital status: Married    Spouse name: Not on file  . Number of children: Not on file  . Years of education: Not on file  . Highest education level: Not on file  Occupational History  . Not on file  Social Needs  . Financial resource strain: Not on file  . Food insecurity    Worry: Not on file    Inability: Not on file  . Transportation needs    Medical: Not on file    Non-medical: Not on file  Tobacco Use  . Smoking status: Former Smoker    Types: Cigarettes    Quit date: 1967    Years since quitting: 53.7  . Smokeless tobacco: Never Used  Substance and Sexual Activity  . Alcohol use: Not Currently  . Drug use: No  . Sexual activity: Not Currently  Lifestyle  . Physical activity    Days per week: Not on file    Minutes per session: Not on file  . Stress: Not on file  Relationships  . Social Herbalist on phone: Not on file    Gets together: Not on file    Attends religious service: Not on file    Active member of club or organization: Not on file    Attends meetings of clubs or organizations: Not on file    Relationship status: Not on file  . Intimate partner violence    Fear of current or ex partner: Not on file    Emotionally abused: Not on file  Physically abused: Not on file    Forced sexual activity: Not on file  Other  Topics Concern  . Not on file  Social History Narrative  . Not on file      Review of Systems  Constitutional: Positive for fatigue. Negative for chills and unexpected weight change.       States that fatigue is worsening.   HENT: Negative for congestion, rhinorrhea, sinus pressure, sneezing and sore throat.   Respiratory: Negative for cough, chest tightness and shortness of breath.   Cardiovascular: Negative for chest pain and palpitations.  Gastrointestinal: Positive for nausea. Negative for abdominal pain, constipation, diarrhea and vomiting.  Musculoskeletal: Positive for arthralgias and myalgias. Negative for back pain, joint swelling and neck pain.       Generalized joint pain  Skin: Negative for rash.  Allergic/Immunologic: Negative for environmental allergies.  Neurological: Positive for headaches. Negative for tremors and numbness.  Hematological: Negative for adenopathy. Does not bruise/bleed easily.  Psychiatric/Behavioral: Negative for behavioral problems and sleep disturbance. The patient is not nervous/anxious.    Today's Vitals   04/14/19 1133  BP: 135/73  Pulse: 77  Resp: 16  SpO2: 97%  Weight: 139 lb 12.8 oz (63.4 kg)  Height: 5\' 1"  (1.549 m)   Body mass index is 26.41 kg/m.  Physical Exam Vitals signs and nursing note reviewed.  Constitutional:      General: She is not in acute distress.    Appearance: Normal appearance. She is well-developed. She is not diaphoretic.  HENT:     Head: Normocephalic and atraumatic.     Mouth/Throat:     Pharynx: No oropharyngeal exudate.  Eyes:     Pupils: Pupils are equal, round, and reactive to light.  Neck:     Musculoskeletal: Normal range of motion and neck supple.     Thyroid: No thyromegaly.     Vascular: No JVD.     Trachea: No tracheal deviation.  Cardiovascular:     Rate and Rhythm: Normal rate and regular rhythm.     Heart sounds: Normal heart sounds. No murmur. No friction rub. No gallop.   Pulmonary:      Effort: Pulmonary effort is normal. No respiratory distress.     Breath sounds: Normal breath sounds. No wheezing or rales.  Chest:     Chest wall: No tenderness.  Abdominal:     General: Bowel sounds are normal.     Palpations: Abdomen is soft.  Musculoskeletal: Normal range of motion.  Lymphadenopathy:     Cervical: No cervical adenopathy.  Skin:    General: Skin is warm and dry.  Neurological:     Mental Status: She is alert and oriented to person, place, and time.     Cranial Nerves: No cranial nerve deficit.  Psychiatric:        Behavior: Behavior normal.        Thought Content: Thought content normal.        Judgment: Judgment normal.    Assessment/Plan: 1. Other fatigue Unknown etiology. Patient being evaluated per hematology and rheumatology to determine cause and treatment.   2. Iron deficiency anemia, unspecified iron deficiency anemia type Treated per hematology. Most recent iron levels good.   3. Mild intermittent asthma without complication Use rescue inhaler as needed and as prescribed  4. Primary osteoarthritis involving multiple joints Check connective tissue panel. Recommend she continue to see orthopedics for pain management.   General Counseling: Vanessa Evans verbalizes understanding of the findings of todays visit and  agrees with plan of treatment. I have discussed any further diagnostic evaluation that may be needed or ordered today. We also reviewed her medications today. she has been encouraged to call the office with any questions or concerns that should arise related to todays visit.  This patient was seen by Leretha Pol FNP Collaboration with Dr Lavera Guise as a part of collaborative care agreement   Time spent: 25 Minutes      Dr Lavera Guise Internal medicine

## 2019-04-19 ENCOUNTER — Other Ambulatory Visit: Payer: Self-pay | Admitting: Nurse Practitioner

## 2019-04-19 DIAGNOSIS — E559 Vitamin D deficiency, unspecified: Secondary | ICD-10-CM | POA: Diagnosis not present

## 2019-04-19 DIAGNOSIS — R5383 Other fatigue: Secondary | ICD-10-CM | POA: Diagnosis not present

## 2019-04-19 DIAGNOSIS — E756 Lipid storage disorder, unspecified: Secondary | ICD-10-CM | POA: Diagnosis not present

## 2019-04-19 DIAGNOSIS — M064 Inflammatory polyarthropathy: Secondary | ICD-10-CM | POA: Diagnosis not present

## 2019-04-20 LAB — COMPREHENSIVE METABOLIC PANEL
ALT: 26 IU/L (ref 0–32)
AST: 21 IU/L (ref 0–40)
Albumin/Globulin Ratio: 2.8 — ABNORMAL HIGH (ref 1.2–2.2)
Albumin: 4.7 g/dL (ref 3.7–4.7)
Alkaline Phosphatase: 70 IU/L (ref 39–117)
BUN/Creatinine Ratio: 32 — ABNORMAL HIGH (ref 12–28)
BUN: 19 mg/dL (ref 8–27)
Bilirubin Total: <0.2 mg/dL (ref 0.0–1.2)
CO2: 24 mmol/L (ref 20–29)
Calcium: 9.6 mg/dL (ref 8.7–10.3)
Chloride: 101 mmol/L (ref 96–106)
Creatinine, Ser: 0.6 mg/dL (ref 0.57–1.00)
GFR calc Af Amer: 104 mL/min/{1.73_m2} (ref >59)
GFR calc non Af Amer: 90 mL/min/{1.73_m2} (ref >59)
Globulin, Total: 1.7 g/dL (ref 1.5–4.5)
Glucose: 101 mg/dL — ABNORMAL HIGH (ref 65–99)
Potassium: 4.8 mmol/L (ref 3.5–5.2)
Sodium: 143 mmol/L (ref 134–144)
Total Protein: 6.4 g/dL (ref 6.0–8.5)

## 2019-04-20 LAB — ANA W/REFLEX IF POSITIVE
Anti JO-1: <0.2 AI (ref 0.0–0.9)
Anti Nuclear Antibody (ANA): POSITIVE — AB
Centromere Ab Screen: <0.2 AI (ref 0.0–0.9)
Chromatin Ab SerPl-aCnc: <0.2 AI (ref 0.0–0.9)
ENA RNP Ab: 0.2 AI (ref 0.0–0.9)
ENA SM Ab Ser-aCnc: <0.2 AI (ref 0.0–0.9)
ENA SSA (RO) Ab: <0.2 AI (ref 0.0–0.9)
ENA SSB (LA) Ab: <0.2 AI (ref 0.0–0.9)
Scleroderma (Scl-70) (ENA) Antibody, IgG: 1.2 AI — ABNORMAL HIGH (ref 0.0–0.9)
dsDNA Ab: <1 IU/mL (ref 0–9)

## 2019-04-20 LAB — LIPID PANEL W/O CHOL/HDL RATIO
Cholesterol, Total: 200 mg/dL — ABNORMAL HIGH (ref 100–199)
HDL: 51 mg/dL (ref >39)
LDL Chol Calc (NIH): 111 mg/dL — ABNORMAL HIGH (ref 0–99)
Triglycerides: 223 mg/dL — ABNORMAL HIGH (ref 0–149)
VLDL Cholesterol Cal: 38 mg/dL (ref 5–40)

## 2019-04-20 LAB — URIC ACID: Uric Acid: 4.8 mg/dL (ref 2.5–7.1)

## 2019-04-20 LAB — RHEUMATOID FACTOR: Rheumatoid fact SerPl-aCnc: <10 IU/mL (ref 0.0–13.9)

## 2019-04-20 LAB — SEDIMENTATION RATE: Sed Rate: 9 mm/hr (ref 0–40)

## 2019-04-20 NOTE — Progress Notes (Signed)
Waiting on additional lab results.

## 2019-04-20 NOTE — Progress Notes (Signed)
Ana positive. Waiting for reflex.

## 2019-04-24 ENCOUNTER — Ambulatory Visit: Payer: Medicare Other | Admitting: Nurse Practitioner

## 2019-04-26 ENCOUNTER — Telehealth: Payer: Self-pay

## 2019-04-26 DIAGNOSIS — M150 Primary generalized (osteo)arthritis: Secondary | ICD-10-CM | POA: Insufficient documentation

## 2019-04-26 DIAGNOSIS — M159 Polyosteoarthritis, unspecified: Secondary | ICD-10-CM | POA: Insufficient documentation

## 2019-04-26 DIAGNOSIS — M8949 Other hypertrophic osteoarthropathy, multiple sites: Secondary | ICD-10-CM | POA: Insufficient documentation

## 2019-04-26 NOTE — Telephone Encounter (Signed)
Pt advised for labs and gave beth for referral pt are not recently by rheumatology

## 2019-04-26 NOTE — Telephone Encounter (Signed)
-----   Message from Ronnell Freshwater, NP sent at 04/26/2019  1:27 PM EDT ----- Can you find out if patient sees rheumatologist? If not, I would like to refer her. Labs show positive ANA with antibodies consistent with scleroderma or other mixed connective tissue disease.  Thanks.

## 2019-04-26 NOTE — Progress Notes (Signed)
Can you find out if patient sees rheumatologist? If not, I would like to refer her. Labs show positive ANA with antibodies consistent with scleroderma or other mixed connective tissue disease.  Thanks.

## 2019-05-01 DIAGNOSIS — M7062 Trochanteric bursitis, left hip: Secondary | ICD-10-CM | POA: Diagnosis not present

## 2019-05-01 DIAGNOSIS — R768 Other specified abnormal immunological findings in serum: Secondary | ICD-10-CM | POA: Diagnosis not present

## 2019-05-01 DIAGNOSIS — H35371 Puckering of macula, right eye: Secondary | ICD-10-CM | POA: Diagnosis not present

## 2019-05-01 DIAGNOSIS — M8949 Other hypertrophic osteoarthropathy, multiple sites: Secondary | ICD-10-CM | POA: Diagnosis not present

## 2019-05-26 ENCOUNTER — Other Ambulatory Visit: Payer: Self-pay

## 2019-05-26 DIAGNOSIS — Z20822 Contact with and (suspected) exposure to covid-19: Secondary | ICD-10-CM

## 2019-05-26 DIAGNOSIS — Z20828 Contact with and (suspected) exposure to other viral communicable diseases: Secondary | ICD-10-CM | POA: Diagnosis not present

## 2019-05-27 LAB — NOVEL CORONAVIRUS, NAA: SARS-CoV-2, NAA: NOT DETECTED

## 2019-06-01 DIAGNOSIS — Z23 Encounter for immunization: Secondary | ICD-10-CM | POA: Diagnosis not present

## 2019-06-09 ENCOUNTER — Encounter: Payer: Self-pay | Admitting: Nurse Practitioner

## 2019-06-09 ENCOUNTER — Other Ambulatory Visit: Payer: Self-pay

## 2019-06-09 ENCOUNTER — Ambulatory Visit (INDEPENDENT_AMBULATORY_CARE_PROVIDER_SITE_OTHER): Payer: Medicare Other | Admitting: Nurse Practitioner

## 2019-06-09 VITALS — BP 147/78 | HR 91 | Temp 97.8°F | Resp 16 | Ht 61.0 in | Wt 140.0 lb

## 2019-06-09 DIAGNOSIS — E782 Mixed hyperlipidemia: Secondary | ICD-10-CM | POA: Diagnosis not present

## 2019-06-09 DIAGNOSIS — R300 Dysuria: Secondary | ICD-10-CM

## 2019-06-09 DIAGNOSIS — K219 Gastro-esophageal reflux disease without esophagitis: Secondary | ICD-10-CM | POA: Diagnosis not present

## 2019-06-09 DIAGNOSIS — F339 Major depressive disorder, recurrent, unspecified: Secondary | ICD-10-CM | POA: Diagnosis not present

## 2019-06-09 DIAGNOSIS — N393 Stress incontinence (female) (male): Secondary | ICD-10-CM

## 2019-06-09 DIAGNOSIS — M7918 Myalgia, other site: Secondary | ICD-10-CM

## 2019-06-09 LAB — POCT URINALYSIS DIPSTICK
Bilirubin, UA: NEGATIVE
Blood, UA: NEGATIVE
Glucose, UA: NEGATIVE
Ketones, UA: NEGATIVE
Leukocytes, UA: NEGATIVE
Nitrite, UA: NEGATIVE
Protein, UA: NEGATIVE
Spec Grav, UA: 1.01 (ref 1.010–1.025)
Urobilinogen, UA: 0.2 E.U./dL
pH, UA: 6 (ref 5.0–8.0)

## 2019-06-09 MED ORDER — PANTOPRAZOLE SODIUM 40 MG PO TBEC: 40.0000 mg | DELAYED_RELEASE_TABLET | Freq: Two times a day (BID) | ORAL | 1 refills | Status: DC

## 2019-06-09 MED ORDER — PREDNISONE 10 MG (48) PO TBPK: ORAL_TABLET | ORAL | 0 refills | Status: DC

## 2019-06-09 MED ORDER — CITALOPRAM HYDROBROMIDE 10 MG PO TABS: 10.0000 mg | ORAL_TABLET | Freq: Every day | ORAL | 1 refills | Status: DC

## 2019-06-09 MED ORDER — ESTRADIOL 0.1 MG/GM VA CREA: TOPICAL_CREAM | VAGINAL | 5 refills | Status: DC

## 2019-06-09 MED ORDER — FENOFIBRATE 160 MG PO TABS: ORAL_TABLET | ORAL | 1 refills | Status: DC

## 2019-06-09 NOTE — Progress Notes (Signed)
[place], [place] [address]  Internal MEDICINE  Office Visit Note  Patient Name: Vanessa Evans  [id number]  UI:[id number]  Date of Service: 06/25/2019   Pt is here for a sick visit.  Chief Complaint  Patient presents with  . Pain    rib pain on left side, mostly aches, sometimes is sharp pain  . Fatigue  . Medication Refill    citalopram, fenofibrate, pantoprazole (given by GI, no longer see him) 40mg  2 times daily, estrace cream, would like to get a 90 day, want to have tamiflu on hand     The patient Is here for acute visit. Today, main complaint is right flank pain. Has been happening over the last month. Gradually ha been getting worse. Describes the pain as an intermittent ache. This is most severe when she is active. Does get some better with rest. She does have multiple bulging discs in the lumbar spine with thickened ligamentum flavum. She does not remember injury or trauma to the area. Urine sample is negative for evidence of infection or abnormalities. She denies abdominal pain, nausea, vomiting, diarrhea, or unusual constipation.       Current Medication:  Outpatient Encounter Medications as of 06/09/2019  Medication Sig  . acetaminophen (TYLENOL) 500 MG tablet Take 1,000 mg by mouth 2 (two) times daily as needed for moderate pain.  Marland Kitchen albuterol (PROVENTIL) (2.5 MG/3ML) 0.083% nebulizer solution Take 3 mLs (2.5 mg total) by nebulization every 6 (six) hours as needed for wheezing or shortness of breath.  Marland Kitchen albuterol (VENTOLIN HFA) 108 (90 Base) MCG/ACT inhaler Inhale 2 puffs into the lungs every 4 (four) hours as needed for wheezing or shortness of breath.  Marland Kitchen aspirin EC 81 MG tablet Take 81 mg by mouth at bedtime.   . benzonatate (TESSALON) 100 MG capsule Take 2 capsules (200 mg total) by mouth 2 (two) times daily as needed for cough.  Marland Kitchen BLACK ELDERBERRY PO Take 1 Dose by mouth 2 (two) times a day.  . Calcium Carbonate-Vitamin  D (CALCIUM 600+D) 600-400 MG-UNIT tablet Take 1 tablet by mouth 2 (two) times daily.  . cholecalciferol (VITAMIN D) 1000 UNITS tablet Take 1,000 Units by mouth at bedtime.   . citalopram (CELEXA) 10 MG tablet Take 1 tablet (10 mg total) by mouth at bedtime.  . cyanocobalamin 500 MCG tablet Take 500 mcg by mouth daily.  Marland Kitchen diltiazem (CARDIZEM CD) 120 MG 24 hr capsule Take 120 mg by mouth at bedtime.   . diphenhydrAMINE (BENADRYL) 25 MG tablet Take 25-50 mg by mouth daily as needed for itching.  . docusate sodium (COLACE) 100 MG capsule Take 200 mg by mouth at bedtime as needed for moderate constipation.   Marland Kitchen EPINEPHrine (EPIPEN 2-PAK) 0.3 mg/0.3 mL IJ SOAJ injection use as directed for severe allergy reaction  . estradiol (ESTRACE VAGINAL) 0.1 MG/GM vaginal cream Apply a pea-sized amount to urethra at bedtime  . fenofibrate 160 MG tablet take1 tab po daily for chol  . folic acid (FOLVITE) [id number] MCG tablet Take 400 mcg by mouth daily.  Marland Kitchen guaiFENesin (MUCINEX) 600 MG 12 hr tablet Take 600 mg by mouth at bedtime as needed (congestion).  . Hypromellose (ARTIFICIAL TEARS OP) Place 1-2 drops into both eyes daily as needed (dry eyes).  Marland Kitchen ketotifen (ZADITOR) 0.025 % ophthalmic solution Place 1 drop into both eyes 2 (two) times daily as needed (allergies).  Marland Kitchen levocetirizine (XYZAL) 5 MG tablet Take 1 tablet (5  mg total) by mouth every evening.  Marland Kitchen levothyroxine (SYNTHROID, LEVOTHROID) 50 MCG tablet Take 50 mcg by mouth daily before breakfast. Brand Name only  . Magnesium 250 MG TABS Take 1 tablet by mouth daily.  . Melatonin 5 MG TABS Take 1 tablet by mouth at bedtime. For sleep   . Multiple Vitamins-Minerals (MULTIVITAMIN WITH MINERALS) tablet Take 1 tablet by mouth daily.  . pantoprazole (PROTONIX) 40 MG tablet Take 1 tablet (40 mg total) by mouth 2 (two) times daily.  Marland Kitchen Phenylephrine-guaiFENesin 10-400 MG TABS Take 1 tablet by mouth every 4 (four) hours as needed (congestion).  . pyridoxine (B-6) 100 MG  tablet Take 100 mg by mouth daily.  . sodium chloride (OCEAN) 0.65 % SOLN nasal spray Place 1 spray into both nostrils 2 (two) times daily.  Marland Kitchen Specialty Vitamins Products (ECHINACEA C COMPLETE PO) Take by mouth.  . zinc gluconate 50 MG tablet Take 50 mg by mouth daily.  . [DISCONTINUED] citalopram (CELEXA) 10 MG tablet Take 1 tablet (10 mg total) by mouth at bedtime.  . [DISCONTINUED] estradiol (ESTRACE VAGINAL) 0.1 MG/GM vaginal cream Apply a pea-sized amount to urethra at bedtime  . [DISCONTINUED] fenofibrate 160 MG tablet take1 tab po daily for chol  . [DISCONTINUED] pantoprazole (PROTONIX) 40 MG tablet Take 40 mg by mouth 2 (two) times daily.  . predniSONE (STERAPRED UNI-PAK 48 TAB) 10 MG (48) TBPK tablet 12 day taper - take by mouth as directed for 12 days   No facility-administered encounter medications on file as of 06/09/2019.       Medical History: Past Medical History:  Diagnosis Date  . Arthritis    osteo  . Asthma    needs rescue inhaler few times a year  . Depression   . Dizziness    light headed spells but it has been a while  . Dysrhythmia    tachycardia.(cardizem). brady during procedures  . GERD (gastroesophageal reflux disease)    gastritis  . Headache(784.0)   . Heart murmur 2019   aortic. dr. Nehemiah Massed not worried about this  . Hyperlipidemia   . Hypertension   . Hypothyroidism   . Migraine   . Orthopnea   . Pneumonia    long time ago (greater than 5 years ago)  . PONV (postoperative nausea and vomiting)    very anxious during cataract surgery. brady during colonoscopy  . Shortness of breath    any exertion, cannot lie flat     Today's Vitals   06/09/19 1357  BP: (!) 147/78  Pulse: 91  Resp: 16  Temp: 97.8 F (36.6 C)  SpO2: 95%  Weight: 140 lb (63.5 kg)  Height: 5\' 1"  (1.549 m)   Body mass index is 26.45 kg/m.  Review of Systems  Constitutional: Positive for fatigue. Negative for chills and unexpected weight change.       States that  fatigue is worsening.   HENT: Negative for congestion, rhinorrhea, sinus pressure, sneezing and sore throat.   Respiratory: Negative for cough, chest tightness and shortness of breath.   Cardiovascular: Negative for chest pain and palpitations.  Gastrointestinal: Positive for nausea. Negative for abdominal pain, constipation, diarrhea and vomiting.  Musculoskeletal: Positive for arthralgias and myalgias. Negative for back pain, joint swelling and neck pain.       Generalized joint pain  Skin: Negative for rash.  Allergic/Immunologic: Negative for environmental allergies.  Neurological: Positive for headaches. Negative for tremors and numbness.  Hematological: Negative for adenopathy. Does not bruise/bleed easily.  Psychiatric/Behavioral: Negative for behavioral problems and sleep disturbance. The patient is not nervous/anxious.     Physical Exam Vitals signs and nursing note reviewed.  Constitutional:      General: She is not in acute distress.    Appearance: Normal appearance. She is well-developed. She is not diaphoretic.  HENT:     Head: Normocephalic and atraumatic.     Mouth/Throat:     Pharynx: No oropharyngeal exudate.  Eyes:     Pupils: Pupils are equal, round, and reactive to light.  Neck:     Musculoskeletal: Normal range of motion and neck supple.     Thyroid: No thyromegaly.     Vascular: No JVD.     Trachea: No tracheal deviation.  Cardiovascular:     Rate and Rhythm: Normal rate and regular rhythm.     Heart sounds: Normal heart sounds. No murmur. No friction rub. No gallop.   Pulmonary:     Effort: Pulmonary effort is normal. No respiratory distress.     Breath sounds: Normal breath sounds. No wheezing or rales.  Chest:     Chest wall: No tenderness.  Abdominal:     General: Bowel sounds are normal.     Palpations: Abdomen is soft.     Tenderness: There is no abdominal tenderness.  Genitourinary:    Comments: Urine sample is negative for infection or evidence  of abnormalities today Musculoskeletal: Normal range of motion.     Comments: There is generalized joint pain as well as moderate mid back pain. No point tenderness present. No acute abnormalities or deformities which are appreciated today.   Lymphadenopathy:     Cervical: No cervical adenopathy.  Skin:    General: Skin is warm and dry.  Neurological:     Mental Status: She is alert and oriented to person, place, and time.     Cranial Nerves: No cranial nerve deficit.  Psychiatric:        Behavior: Behavior normal.        Thought Content: Thought content normal.        Judgment: Judgment normal.    Assessment/Plan: 1. Intercostal muscle pain Add prednisone taper. Take as directed for 12 days. Recommend she discuss current symptoms with rheumatologist/orthopedics if no improvement.  - predniSONE (STERAPRED UNI-PAK 48 TAB) 10 MG (48) TBPK tablet; 12 day taper - take by mouth as directed for 12 days  Dispense: 48 tablet; Refill: 0  2. Gastroesophageal reflux disease without esophagitis - pantoprazole (PROTONIX) 40 MG tablet; Take 1 tablet (40 mg total) by mouth 2 (two) times daily.  Dispense: 180 tablet; Refill: 1  3. Female stress incontinence Continue to use estradiol cream two to three days per week.  - estradiol (ESTRACE VAGINAL) 0.1 MG/GM vaginal cream; Apply a pea-sized amount to urethra at bedtime  Dispense: 42.5 g; Refill: 5  4. Mixed hyperlipidemia - fenofibrate 160 MG tablet; take1 tab po daily for chol  Dispense: 90 tablet; Refill: 1  5. Episode of recurrent major depressive disorder, unspecified depression episode severity ([place]) - citalopram (CELEXA) 10 MG tablet; Take 1 tablet (10 mg total) by mouth at bedtime.  Dispense: 90 tablet; Refill: 1  6. Dysuria - POCT urinalysis dipstick negative for infection or evidence of abnormality today.  General Counseling: Ariyanah verbalizes understanding of the findings of todays visit and agrees with plan of treatment. I have  discussed any further diagnostic evaluation that may be needed or ordered today. We also reviewed her medications today. she has been  encouraged to call the office with any questions or concerns that should arise related to todays visit.    Counseling:  This patient was seen by Leretha Pol FNP Collaboration with Dr Lavera Guise as a part of collaborative care agreement  Orders Placed This Encounter  Procedures  . POCT urinalysis dipstick    Meds ordered this encounter  Medications  . predniSONE (STERAPRED UNI-PAK 48 TAB) 10 MG (48) TBPK tablet    Sig: 12 day taper - take by mouth as directed for 12 days    Dispense:  48 tablet    Refill:  0    Order Specific Question:   Supervising Provider    Answer:   Lavera Guise [place]  . citalopram (CELEXA) 10 MG tablet    Sig: Take 1 tablet (10 mg total) by mouth at bedtime.    Dispense:  90 tablet    Refill:  1    Order Specific Question:   Supervising Provider    Answer:   Lavera Guise [id number]  . estradiol (ESTRACE VAGINAL) 0.1 MG/GM vaginal cream    Sig: Apply a pea-sized amount to urethra at bedtime    Dispense:  42.5 g    Refill:  5    Order Specific Question:   Supervising Provider    Answer:   Lavera Guise [place]  . fenofibrate 160 MG tablet    Sig: take1 tab po daily for chol    Dispense:  90 tablet    Refill:  1    Order Specific Question:   Supervising Provider    Answer:   Lavera Guise [id number]  . pantoprazole (PROTONIX) 40 MG tablet    Sig: Take 1 tablet (40 mg total) by mouth 2 (two) times daily.    Dispense:  180 tablet    Refill:  1    Order Specific Question:   Supervising Provider    Answer:   Lavera Guise [id number]    Time spent: 25 Minutes

## 2019-06-12 DIAGNOSIS — D2272 Melanocytic nevi of left lower limb, including hip: Secondary | ICD-10-CM | POA: Diagnosis not present

## 2019-06-12 DIAGNOSIS — L609 Nail disorder, unspecified: Secondary | ICD-10-CM | POA: Diagnosis not present

## 2019-06-12 DIAGNOSIS — L821 Other seborrheic keratosis: Secondary | ICD-10-CM | POA: Diagnosis not present

## 2019-06-12 DIAGNOSIS — D485 Neoplasm of uncertain behavior of skin: Secondary | ICD-10-CM | POA: Diagnosis not present

## 2019-06-12 DIAGNOSIS — L118 Other specified acantholytic disorders: Secondary | ICD-10-CM | POA: Diagnosis not present

## 2019-06-12 DIAGNOSIS — X32XXXA Exposure to sunlight, initial encounter: Secondary | ICD-10-CM | POA: Diagnosis not present

## 2019-06-12 DIAGNOSIS — L570 Actinic keratosis: Secondary | ICD-10-CM | POA: Diagnosis not present

## 2019-06-12 DIAGNOSIS — D2262 Melanocytic nevi of left upper limb, including shoulder: Secondary | ICD-10-CM | POA: Diagnosis not present

## 2019-06-12 DIAGNOSIS — D2271 Melanocytic nevi of right lower limb, including hip: Secondary | ICD-10-CM | POA: Diagnosis not present

## 2019-06-12 DIAGNOSIS — D2261 Melanocytic nevi of right upper limb, including shoulder: Secondary | ICD-10-CM | POA: Diagnosis not present

## 2019-06-12 DIAGNOSIS — D225 Melanocytic nevi of trunk: Secondary | ICD-10-CM | POA: Diagnosis not present

## 2019-06-12 DIAGNOSIS — L608 Other nail disorders: Secondary | ICD-10-CM | POA: Diagnosis not present

## 2019-06-25 DIAGNOSIS — M7918 Myalgia, other site: Secondary | ICD-10-CM | POA: Insufficient documentation

## 2019-07-11 ENCOUNTER — Ambulatory Visit: Payer: Medicare Other | Admitting: Oncology

## 2019-07-11 ENCOUNTER — Other Ambulatory Visit: Payer: Medicare Other

## 2019-07-12 DIAGNOSIS — Z23 Encounter for immunization: Secondary | ICD-10-CM | POA: Diagnosis not present

## 2019-07-20 ENCOUNTER — Telehealth: Payer: Self-pay

## 2019-07-20 ENCOUNTER — Other Ambulatory Visit: Payer: Self-pay

## 2019-07-20 NOTE — Telephone Encounter (Signed)
CONFIRMED AND SCREENED FOR 07-24-19 OV. 

## 2019-07-21 ENCOUNTER — Encounter: Payer: Self-pay | Admitting: Oncology

## 2019-07-21 ENCOUNTER — Inpatient Hospital Stay: Payer: Medicare Other | Attending: Oncology

## 2019-07-21 ENCOUNTER — Other Ambulatory Visit: Payer: Self-pay

## 2019-07-21 DIAGNOSIS — D509 Iron deficiency anemia, unspecified: Secondary | ICD-10-CM | POA: Diagnosis not present

## 2019-07-21 LAB — CBC WITH DIFFERENTIAL/PLATELET
Abs Immature Granulocytes: 0.11 10*3/uL — ABNORMAL HIGH (ref 0.00–0.07)
Basophils Absolute: 0.1 10*3/uL (ref 0.0–0.1)
Basophils Relative: 1 %
Eosinophils Absolute: 0.1 10*3/uL (ref 0.0–0.5)
Eosinophils Relative: 1 %
HCT: 40.6 % (ref 36.0–46.0)
Hemoglobin: 12.9 g/dL (ref 12.0–15.0)
Immature Granulocytes: 1 %
Lymphocytes Relative: 24 %
Lymphs Abs: 1.9 10*3/uL (ref 0.7–4.0)
MCH: 29.3 pg (ref 26.0–34.0)
MCHC: 31.8 g/dL (ref 30.0–36.0)
MCV: 92.3 fL (ref 80.0–100.0)
Monocytes Absolute: 0.9 10*3/uL (ref 0.1–1.0)
Monocytes Relative: 12 %
Neutro Abs: 4.9 10*3/uL (ref 1.7–7.7)
Neutrophils Relative %: 61 %
Platelets: 343 10*3/uL (ref 150–400)
RBC: 4.4 MIL/uL (ref 3.87–5.11)
RDW: 13 % (ref 11.5–15.5)
WBC: 8 10*3/uL (ref 4.0–10.5)
nRBC: 0 % (ref 0.0–0.2)

## 2019-07-21 LAB — IRON AND TIBC
Iron: 101 ug/dL (ref 28–170)
Saturation Ratios: 21 % (ref 10.4–31.8)
TIBC: 477 ug/dL — ABNORMAL HIGH (ref 250–450)
UIBC: 376 ug/dL

## 2019-07-21 LAB — FERRITIN: Ferritin: 99 ng/mL (ref 11–307)

## 2019-07-21 NOTE — Progress Notes (Unsigned)
Patient stated that she had been feeling tired and fatigued.

## 2019-07-24 ENCOUNTER — Encounter: Payer: Self-pay | Admitting: Nurse Practitioner

## 2019-07-24 ENCOUNTER — Ambulatory Visit (INDEPENDENT_AMBULATORY_CARE_PROVIDER_SITE_OTHER): Payer: Medicare Other | Admitting: Nurse Practitioner

## 2019-07-24 ENCOUNTER — Inpatient Hospital Stay: Payer: Medicare Other | Admitting: Oncology

## 2019-07-24 ENCOUNTER — Other Ambulatory Visit: Payer: Self-pay

## 2019-07-24 VITALS — BP 145/83 | HR 83 | Temp 97.5°F | Resp 16 | Ht 61.0 in | Wt 146.0 lb

## 2019-07-24 DIAGNOSIS — R300 Dysuria: Secondary | ICD-10-CM

## 2019-07-24 DIAGNOSIS — Z1231 Encounter for screening mammogram for malignant neoplasm of breast: Secondary | ICD-10-CM

## 2019-07-24 DIAGNOSIS — G4719 Other hypersomnia: Secondary | ICD-10-CM | POA: Diagnosis not present

## 2019-07-24 DIAGNOSIS — Z0001 Encounter for general adult medical examination with abnormal findings: Secondary | ICD-10-CM | POA: Diagnosis not present

## 2019-07-24 DIAGNOSIS — D509 Iron deficiency anemia, unspecified: Secondary | ICD-10-CM

## 2019-07-24 DIAGNOSIS — R5383 Other fatigue: Secondary | ICD-10-CM

## 2019-07-24 DIAGNOSIS — M064 Inflammatory polyarthropathy: Secondary | ICD-10-CM | POA: Diagnosis not present

## 2019-07-24 MED ORDER — DULOXETINE HCL 20 MG PO CPEP: 20.0000 mg | ORAL_CAPSULE | Freq: Every day | ORAL | 3 refills | Status: DC

## 2019-07-24 NOTE — Progress Notes (Signed)
[facility] [place], [place] [address]  Internal MEDICINE  Office Visit Note  Patient Name: Vanessa Evans  [id number]  UI:[id number]  Date of Service: 07/26/2019  Chief Complaint  Patient presents with  . Annual Exam  . Hypertension  . Hyperlipidemia  . Gastroesophageal Reflux  . Asthma  . Arthritis    right thumb joint is swollen and sore, ankles have been stiff   . Fatigue    sleepy, tired, no energy, last two weeks had malaise with no other symptoms, but no longer having that issue      The patient is here for health maintenance exam. She continues to have severe fatigue. Effects her mostly during the day. She had sleep study a few years ago which was negative. Had labs done her hematology. Iron studies looked good. She has generalized joint pain. Had been getting injections into her hip to help with chronic bursitis. She was scheduled to have injection into her back due to spondylodesis. She has not had these done in some time due to fear of COVID 19 s(read. She is unable to take anti-inflammatories for pain and does not wish to have any narcotic pain relievers. She does have family history of rheumatological disorders. She is helping to home school her 69 year old granddaughter who is autistic. Patient states that she does not sleep well due to pain and inflammation of the joints. She is scheduled to see her endocrinologist this afternoon.    Pt is here for routine health maintenance examination  Current Medication: Outpatient Encounter Medications as of 07/24/2019  Medication Sig  . acetaminophen (TYLENOL) 500 MG tablet Take 1,000 mg by mouth 2 (two) times daily as needed for moderate pain.  Marland Kitchen albuterol (PROVENTIL) (2.5 MG/3ML) 0.083% nebulizer solution Take 3 mLs (2.5 mg total) by nebulization every 6 (six) hours as needed for wheezing or shortness of breath.  Marland Kitchen albuterol (VENTOLIN HFA) 108 (90 Base) MCG/ACT inhaler Inhale 2 puffs into the lungs  every 4 (four) hours as needed for wheezing or shortness of breath.  Marland Kitchen aspirin EC 81 MG tablet Take 81 mg by mouth at bedtime.   . benzonatate (TESSALON) 100 MG capsule Take 2 capsules (200 mg total) by mouth 2 (two) times daily as needed for cough.  Marland Kitchen BLACK ELDERBERRY PO Take 1 Dose by mouth 2 (two) times a day.  . Calcium Carbonate-Vitamin D (CALCIUM 600+D) 600-400 MG-UNIT tablet Take 1 tablet by mouth 2 (two) times daily.  . cholecalciferol (VITAMIN D) 1000 UNITS tablet Take 1,000 Units by mouth at bedtime.   . citalopram (CELEXA) 10 MG tablet Take 1 tablet (10 mg total) by mouth at bedtime.  . cyanocobalamin 500 MCG tablet Take 500 mcg by mouth daily.  Marland Kitchen Dextran 70-Hypromellose (ARTIFICIAL TEARS) 0.1-0.3 % SOLN Apply 1-2 drops to eye daily.  Marland Kitchen diltiazem (CARDIZEM CD) 120 MG 24 hr capsule Take 120 mg by mouth at bedtime.   Marland Kitchen diltiazem (TIAZAC) 120 MG 24 hr capsule Take 1 capsule by mouth daily.  . diphenhydrAMINE (BENADRYL) 25 MG tablet Take 25-50 mg by mouth daily as needed for itching.  . docusate sodium (COLACE) 100 MG capsule Take 200 mg by mouth at bedtime as needed for moderate constipation.   Marland Kitchen ELDERBERRY PO Take 1 tablet by mouth 2 (two) times daily.  Marland Kitchen EPINEPHrine (EPIPEN 2-PAK) 0.3 mg/0.3 mL IJ SOAJ injection use as directed for severe allergy reaction  . estradiol (ESTRACE VAGINAL) 0.1 MG/GM vaginal cream Apply  a pea-sized amount to urethra at bedtime  . fenofibrate 160 MG tablet take1 tab po daily for chol  . folic acid (FOLVITE) [id number] MCG tablet Take 400 mcg by mouth daily.  Marland Kitchen guaiFENesin (MUCINEX) 600 MG 12 hr tablet Take 600 mg by mouth at bedtime as needed (congestion).  . Hypromellose (ARTIFICIAL TEARS OP) Place 1-2 drops into both eyes daily as needed (dry eyes).  Marland Kitchen ketotifen (ZADITOR) 0.025 % ophthalmic solution Place 1 drop into both eyes 2 (two) times daily as needed (allergies).  Marland Kitchen levocetirizine (XYZAL) 5 MG tablet Take 1 tablet (5 mg total) by mouth every evening.  Marland Kitchen  levothyroxine (SYNTHROID, LEVOTHROID) 50 MCG tablet Take 50 mcg by mouth daily before breakfast. Brand Name only  . Magnesium 250 MG TABS Take 1 tablet by mouth daily.  . Melatonin 5 MG TABS Take 1 tablet by mouth at bedtime. For sleep   . Multiple Vitamins-Minerals (MULTIVITAMIN WITH MINERALS) tablet Take 1 tablet by mouth daily.  . pantoprazole (PROTONIX) 40 MG tablet Take 1 tablet (40 mg total) by mouth 2 (two) times daily.  Marland Kitchen Phenylephrine-guaiFENesin 10-400 MG TABS Take 1 tablet by mouth every 4 (four) hours as needed (congestion).  . predniSONE (STERAPRED UNI-PAK 48 TAB) 10 MG (48) TBPK tablet 12 day taper - take by mouth as directed for 12 days  . pyridoxine (B-6) 100 MG tablet Take 100 mg by mouth daily.  . sodium chloride (OCEAN) 0.65 % SOLN nasal spray Place 1 spray into both nostrils 2 (two) times daily.  Marland Kitchen Specialty Vitamins Products (ECHINACEA C COMPLETE PO) Take by mouth.  . Vitamin A 7.5 MG (25000 UT) CAPS Take 1 tablet by mouth 2 (two) times daily.  Marland Kitchen zinc gluconate 50 MG tablet Take 50 mg by mouth daily.  . DULoxetine (CYMBALTA) 20 MG capsule Take 1 capsule (20 mg total) by mouth daily.   No facility-administered encounter medications on file as of 07/24/2019.    Surgical History: Past Surgical History:  Procedure Laterality Date  . 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 BIOPSY THYROID  2018  . bladder biopsies  2003   9 biopsies done by dr. cope.  all benign.  inflammatory process going on in bladder  . BREAST BIOPSY Right    benign  . BREAST SURGERY Right    lumpectomy  . CATARACT EXTRACTION W/PHACO Left 12/25/2014   Procedure: CATARACT EXTRACTION PHACO AND INTRAOCULAR LENS PLACEMENT (IOC);  Surgeon: Birder Robson, MD;  Location: [facility] ORS;  Service: Ophthalmology;  Laterality: Left;  [place] 00:32 AP% 20.0 CDE 6.49  . COLONOSCOPY W/ BIOPSIES    . COLONOSCOPY W/ POLYPECTOMY  2002, 2004, 2005   adenomatous  polyps removed  . COLONOSCOPY WITH PROPOFOL N/A 08/23/2017   Procedure: COLONOSCOPY WITH PROPOFOL;  Surgeon: Manya Silvas, MD;  Location: [facility] ENDOSCOPY;  Service: Endoscopy;  Laterality: N/A;  . COLONOSCOPY WITH PROPOFOL N/A 11/08/2017   Procedure: COLONOSCOPY WITH PROPOFOL;  Surgeon: Manya Silvas, MD;  Location: [facility] ENDOSCOPY;  Service: Endoscopy;  Laterality: N/A;  . CYSTOCELE REPAIR N/A 04/09/2016   Procedure: ANTERIOR REPAIR (CYSTOCELE);  Surgeon: Gae Dry, MD;  Location: [facility] ORS;  Service: Gynecology;  Laterality: N/A;  . DIAGNOSTIC LAPAROSCOPY  2008   removed both ovaries with cysts, tubes and fibroids  . DILATION AND CURETTAGE OF UTERUS  1973  . ESOPHAGOGASTRODUODENOSCOPY (EGD) WITH PROPOFOL N/A 08/23/2017   Procedure: ESOPHAGOGASTRODUODENOSCOPY (  EGD) WITH PROPOFOL;  Surgeon: Manya Silvas, MD;  Location: [facility] ENDOSCOPY;  Service: Endoscopy;  Laterality: N/A;  . EYE SURGERY Bilateral 2016  . HERNIA REPAIR Right 2008   Inguinal hernia Repair, ventral hernia repair  . JOINT REPLACEMENT Right 2008   knee  . KNEE ARTHROSCOPY Right   . LUMBAR LAMINECTOMY/DECOMPRESSION MICRODISCECTOMY Right 03/21/2013   Procedure: Right Lumbar five-Sacral one Laminectomy for Synovial Cyst;  Surgeon: Faythe Ghee, MD;  Location: [facility] NEURO ORS;  Service: Neurosurgery;  Laterality: Right;  right  . OOPHORECTOMY    . REVERSE SHOULDER ARTHROPLASTY Right 02/22/2018   Procedure: REVERSE SHOULDER ARTHROPLASTY;  Surgeon: Corky Mull, MD;  Location: [facility] ORS;  Service: Orthopedics;  Laterality: Right;  . TUBAL LIGATION    . UNILATERAL SALPINGECTOMY Left 04/09/2016   Procedure: UNILATERAL SALPINGECTOMY;  Surgeon: Gae Dry, MD;  Location: [facility] ORS;  Service: Gynecology;  Laterality: Left;  . UPPER GI ENDOSCOPY  2010   with biopsy of gastric erosion  . VAGINAL HYSTERECTOMY N/A 04/09/2016   Procedure: HYSTERECTOMY VAGINAL;  Surgeon: Gae Dry, MD;  Location: [facility] ORS;  Service:  Gynecology;  Laterality: N/A;  . VAGINAL HYSTERECTOMY  2017   and bladder tack    Medical History: Past Medical History:  Diagnosis Date  . Arthritis    osteo  . Asthma    needs rescue inhaler few times a year  . Depression   . Dizziness    light headed spells but it has been a while  . Dysrhythmia    tachycardia.(cardizem). brady during procedures  . GERD (gastroesophageal reflux disease)    gastritis  . Headache(784.0)   . Heart murmur 2019   aortic. dr. Nehemiah Massed not worried about this  . Hyperlipidemia   . Hypertension   . Hypothyroidism   . Migraine   . Orthopnea   . Pneumonia    long time ago (greater than 5 years ago)  . PONV (postoperative nausea and vomiting)    very anxious during cataract surgery. brady during colonoscopy  . Shortness of breath    any exertion, cannot lie flat    Family History: Family History  Problem Relation Age of Onset  . Pulmonary fibrosis Mother   . Melanoma Father   . Cardiomyopathy Sister        and arrhythmia  . Breast cancer Cousin        paternal 1st      Review of Systems  Constitutional: Positive for fatigue. Negative for chills and unexpected weight change.       States that fatigue is worsening.   HENT: Negative for congestion, rhinorrhea, sinus pressure, sneezing and sore throat.   Respiratory: Negative for cough, chest tightness, shortness of breath and wheezing.   Cardiovascular: Negative for chest pain and palpitations.  Gastrointestinal: Negative for abdominal pain, constipation, diarrhea, nausea and vomiting.  Endocrine: Negative for cold intolerance, heat intolerance, polydipsia and polyuria.  Genitourinary: Negative for dysuria, flank pain, frequency, pelvic pain and urgency.  Musculoskeletal: Positive for arthralgias and myalgias. Negative for back pain, joint swelling and neck pain.       Generalized joint pain  Skin: Negative for rash.  Allergic/Immunologic: Negative for environmental allergies.   Neurological: Positive for dizziness, weakness and headaches. Negative for tremors and numbness.  Hematological: Negative for adenopathy. Does not bruise/bleed easily.  Psychiatric/Behavioral: Positive for dysphoric mood. Negative for behavioral problems and sleep disturbance. The patient is not nervous/anxious.     Today's Vitals  07/24/19 1004  BP: (!) 145/83  Pulse: 83  Resp: 16  Temp: (!) 97.5 F (36.4 C)  SpO2: 97%  Weight: 146 lb (66.2 kg)  Height: 5\' 1"  (1.549 m)   Body mass index is 27.59 kg/m.  Physical Exam Vitals and nursing note reviewed.  Constitutional:      General: She is not in acute distress.    Appearance: Normal appearance. She is well-developed. She is not diaphoretic.  HENT:     Head: Normocephalic and atraumatic.     Nose: Nose normal.     Mouth/Throat:     Pharynx: No oropharyngeal exudate.  Eyes:     Extraocular Movements: Extraocular movements intact.     Conjunctiva/sclera: Conjunctivae normal.     Pupils: Pupils are equal, round, and reactive to light.  Neck:     Thyroid: No thyromegaly.     Vascular: No carotid bruit or JVD.     Trachea: No tracheal deviation.  Cardiovascular:     Rate and Rhythm: Normal rate and regular rhythm.     Pulses: Normal pulses.     Heart sounds: Normal heart sounds. No murmur. No friction rub. No gallop.   Pulmonary:     Effort: Pulmonary effort is normal. No respiratory distress.     Breath sounds: Normal breath sounds. No wheezing or rales.  Chest:     Chest wall: No tenderness.     Breasts:        Right: Normal. No swelling, bleeding, inverted nipple, mass, nipple discharge, skin change or tenderness.        Left: Normal. No swelling, bleeding, inverted nipple, mass, nipple discharge, skin change or tenderness.  Abdominal:     General: Bowel sounds are normal.     Palpations: Abdomen is soft.     Tenderness: There is no abdominal tenderness.  Genitourinary:    Comments: Urine sample is negative for  infection or evidence of abnormalities today Musculoskeletal:        General: Normal range of motion.     Cervical back: Normal range of motion and neck supple.     Comments: There is generalized joint pain as well as moderate mid back pain. No point tenderness present. No acute abnormalities or deformities which are appreciated today.   Lymphadenopathy:     Cervical: No cervical adenopathy.     Upper Body:     Right upper body: No axillary adenopathy.     Left upper body: No axillary adenopathy.  Skin:    General: Skin is warm and dry.     Capillary Refill: Capillary refill takes 2 to 3 seconds.  Neurological:     General: No focal deficit present.     Mental Status: She is alert and oriented to person, place, and time. Mental status is at baseline.     Cranial Nerves: No cranial nerve deficit.  Psychiatric:        Behavior: Behavior normal.        Thought Content: Thought content normal.        Judgment: Judgment normal.    Depression screen [facility] 2/9 07/24/2019 04/14/2019 12/26/2018 08/22/2018 08/11/2018  Decreased Interest 0 0 0 0 0  Down, Depressed, Hopeless 1 0 0 0 0  PHQ - 2 Score 1 0 0 0 0    Functional Status Survey: Is the patient deaf or have difficulty hearing?: No Does the patient have difficulty seeing, even when wearing glasses/contacts?: No Does the patient have difficulty concentrating, remembering, or  making decisions?: Yes Does the patient have difficulty walking or climbing stairs?: Yes Does the patient have difficulty dressing or bathing?: No Does the patient have difficulty doing errands alone such as visiting a doctor's office or shopping?: No  MMSE - [place] Exam 07/24/2019 04/18/2018  Orientation to time 5 5  Orientation to Place 5 5  Registration 3 3  Attention/ Calculation 5 5  Recall 3 3  Language- name 2 objects 2 2  Language- repeat 1 1  Language- follow 3 step command 3 3  Language- read & follow direction 1 1  Write a sentence 1 1  Copy  design 1 1  Total score 30 30    Fall Risk  07/24/2019 04/14/2019 12/26/2018 08/22/2018 08/11/2018  Falls in the past year? 0 0 1 1 1   Comment - - - - -  Number falls in past yr: - 0 0 0 0  Injury with Fall? - 0 0 0 0      LABS: Recent Results (from the past 2160 hour(s))  Novel Coronavirus, NAA ([facility])     Status: None   Collection Time: 05/26/19 10:48 AM   Specimen: Nasopharyngeal(NP) swabs in vial transport medium   NASOPHARYNGE  TESTING  Result Value Ref Range   SARS-CoV-2, NAA Not Detected Not Detected    Comment: This nucleic acid amplification test was developed and its performance characteristics determined by [employer]. Nucleic acid amplification tests include PCR and TMA. This test has not been FDA cleared or approved. This test has been authorized by FDA under an Emergency Use Authorization (EUA). This test is only authorized for the duration of time the declaration that circumstances exist justifying the authorization of the emergency use of in vitro diagnostic tests for detection of SARS-CoV-2 virus and/or diagnosis of COVID-19 infection under section 564(b)(1) of the Act, 21 U.S.C. [id number]) (1), unless the authorization is terminated or revoked sooner. When diagnostic testing is negative, the possibility of a false negative result should be considered in the context of a patient's recent exposures and the presence of clinical signs and symptoms consistent with COVID-19. An individual without symptoms of COVID-19 and who is not shedding SARS-CoV-2 virus would  expect to have a negative (not detected) result in this assay.   POCT urinalysis dipstick     Status: None   Collection Time: 06/09/19  2:27 PM  Result Value Ref Range   Color, UA     Clarity, UA     Glucose, UA Negative Negative   Bilirubin, UA Negative    Ketones, UA Negative    Spec Grav, UA 1.010 1.010 - 1.025   Blood, UA Negative    pH, UA 6.0 5.0 - 8.0   Protein, UA Negative  Negative   Urobilinogen, UA 0.2 0.2 or 1.0 E.U./dL   Nitrite, UA negative    Leukocytes, UA Negative Negative   Appearance     Odor    Iron and TIBC     Status: Abnormal   Collection Time: 07/21/19 11:56 AM  Result Value Ref Range   Iron 101 28 - 170 ug/dL   TIBC 477 (H) 250 - 450 ug/dL   Saturation Ratios 21 10.4 - 31.8 %   UIBC 376 ug/dL    Comment: Performed at [facility], [address] [place], [place] [address]  Ferritin     Status: None   Collection Time: 07/21/19 11:56 AM  Result Value Ref Range   Ferritin 99 11 - 307  ng/mL    Comment: Performed at [facility], [address] [place], [place] [address]  CBC with Differential/Platelet     Status: Abnormal   Collection Time: 07/21/19 11:56 AM  Result Value Ref Range   WBC 8.0 4.0 - 10.5 K/uL   RBC 4.40 3.87 - 5.11 MIL/uL   Hemoglobin 12.9 12.0 - 15.0 g/dL   HCT 40.6 36.0 - 46.0 %   MCV 92.3 80.0 - 100.0 fL   MCH 29.3 26.0 - 34.0 pg   MCHC 31.8 30.0 - 36.0 g/dL   RDW 13.0 11.5 - 15.5 %   Platelets 343 150 - 400 K/uL   nRBC 0.0 0.0 - 0.2 %   Neutrophils Relative % 61 %   Neutro Abs 4.9 1.7 - 7.7 K/uL   Lymphocytes Relative 24 %   Lymphs Abs 1.9 0.7 - 4.0 K/uL   Monocytes Relative 12 %   Monocytes Absolute 0.9 0.1 - 1.0 K/uL   Eosinophils Relative 1 %   Eosinophils Absolute 0.1 0.0 - 0.5 K/uL   Basophils Relative 1 %   Basophils Absolute 0.1 0.0 - 0.1 K/uL   Immature Granulocytes 1 %   Abs Immature Granulocytes 0.11 (H) 0.00 - 0.07 K/uL    Comment: Performed at [facility], [address] [place], [place] [address]  UA/M w/rflx Culture, Routine     Status: Abnormal (Preliminary result)   Collection Time: 07/24/19 10:00 AM   Specimen: Urine   URINE  Result Value Ref Range   Specific Gravity, UA 1.014 1.005 - 1.030   pH, UA 7.0 5.0 - 7.5   Color, UA Yellow Yellow   Appearance Ur Clear Clear   Leukocytes,UA Trace (A) Negative   Protein,UA Negative Negative/Trace   Glucose, UA  Negative Negative   Ketones, UA Negative Negative   RBC, UA Negative Negative   Bilirubin, UA Negative Negative   Urobilinogen, Ur 0.2 0.2 - 1.0 mg/dL   Nitrite, UA Negative Negative   Microscopic Examination See below:     Comment: Microscopic was indicated and was performed.   Urinalysis Reflex Comment     Comment: This specimen has reflexed to a Urine Culture.  Microscopic Examination     Status: None   Collection Time: 07/24/19 10:00 AM   URINE  Result Value Ref Range   WBC, UA 0-5 0 - 5 /hpf   RBC 0-2 0 - 2 /hpf   Epithelial Cells (non renal) 0-10 0 - 10 /hpf   Casts None seen None seen /lpf   Bacteria, UA Few None seen/Few  Urine Culture, Reflex     Status: None (Preliminary result)   Collection Time: 07/24/19 10:00 AM   URINE  Result Value Ref Range   Urine Culture, Routine WILL FOLLOW     Assessment/Plan:  1. Encounter for general adult medical examination with abnormal findings Annual health maintenance exam today   2. Inflammatory polyarthritis ([place]) Start duloxetine 20mg  every day. Instructions provided for patient to wean dosing of celexa until she Is completely off this medication.  - DULoxetine (CYMBALTA) 20 MG capsule; Take 1 capsule (20 mg total) by mouth daily.  Dispense: 30 capsule; Refill: 3  3. Excessive daytime sleepiness Home sleep study ordered for further evalution.  - Home sleep test; Future  4. Other fatigue Multifactorial.   5. Iron deficiency anemia, unspecified iron deficiency anemia type Reviewed most recent iron studies with patient. She should continue to follow up with hematology as scheduled.   6.  Encounter for screening mammogram for malignant neoplasm of breast - screening mammo; Future  7. Dysuria - UA/M w/rflx Culture, Routine   General Counseling: Exa verbalizes understanding of the findings of todays visit and agrees with plan of treatment. I have discussed any further diagnostic evaluation that may be needed or ordered  today. We also reviewed her medications today. she has been encouraged to call the office with any questions or concerns that should arise related to todays visit.    Counseling:  This patient was seen by Leretha Pol FNP Collaboration with Dr Lavera Guise as a part of collaborative care agreement  Orders Placed This Encounter  Procedures  . Microscopic Examination  . Urine Culture, Reflex  . screening mammo  . UA/M w/rflx Culture, Routine  . Home sleep test    Meds ordered this encounter  Medications  . DULoxetine (CYMBALTA) 20 MG capsule    Sig: Take 1 capsule (20 mg total) by mouth daily.    Dispense:  30 capsule    Refill:  3    Written instructions provided to wean off citalopram.    Order Specific Question:   Supervising Provider    Answer:   Lavera Guise [id number]    Time spent: [place], MD  Internal Medicine

## 2019-07-25 NOTE — Progress Notes (Signed)
Waiting to see culture and sensitivity results. Will treat as indicated.

## 2019-07-26 ENCOUNTER — Telehealth: Payer: Self-pay

## 2019-07-26 DIAGNOSIS — M064 Inflammatory polyarthropathy: Secondary | ICD-10-CM | POA: Insufficient documentation

## 2019-07-26 DIAGNOSIS — G4719 Other hypersomnia: Secondary | ICD-10-CM | POA: Insufficient documentation

## 2019-07-26 DIAGNOSIS — Z1231 Encounter for screening mammogram for malignant neoplasm of breast: Secondary | ICD-10-CM | POA: Insufficient documentation

## 2019-07-26 NOTE — Telephone Encounter (Signed)
Hey. She should start the cymbalta anytime and take everyday. She should take the celexa every other day for one week then every third day for the second week. On third week, she should take citalopram on the fourth day, then she does not need to take citalopram after that. She should stay on duloxetine every day. I have her written instructions at her visit. Is my writing chicken scratch?

## 2019-07-27 ENCOUNTER — Other Ambulatory Visit: Payer: Self-pay

## 2019-07-27 ENCOUNTER — Ambulatory Visit: Payer: Medicare Other | Admitting: Internal Medicine

## 2019-07-27 DIAGNOSIS — G4710 Hypersomnia, unspecified: Secondary | ICD-10-CM

## 2019-07-27 NOTE — Progress Notes (Signed)
Please let the patient know that urine sample indicates evidence of infection. Can you ask her if she can tolerate doxycycline? This is one of the antibiotics which would work for her infection which she is not allergic to. Thanks.

## 2019-07-28 ENCOUNTER — Other Ambulatory Visit: Payer: Self-pay | Admitting: Nurse Practitioner

## 2019-07-28 DIAGNOSIS — N390 Urinary tract infection, site not specified: Secondary | ICD-10-CM

## 2019-07-28 LAB — UA/M W/RFLX CULTURE, ROUTINE
Bilirubin, UA: NEGATIVE
Glucose, UA: NEGATIVE
Ketones, UA: NEGATIVE
Nitrite, UA: NEGATIVE
Protein,UA: NEGATIVE
RBC, UA: NEGATIVE
Specific Gravity, UA: 1.014 (ref 1.005–1.030)
Urobilinogen, Ur: 0.2 mg/dL (ref 0.2–1.0)
pH, UA: 7 (ref 5.0–7.5)

## 2019-07-28 LAB — URINE CULTURE, REFLEX

## 2019-07-28 LAB — MICROSCOPIC EXAMINATION: Casts: NONE SEEN /lpf

## 2019-07-28 MED ORDER — CEPHALEXIN 250 MG PO CAPS: 250.0000 mg | ORAL_CAPSULE | Freq: Three times a day (TID) | ORAL | 0 refills | Status: DC

## 2019-07-28 NOTE — Progress Notes (Signed)
Started cephalexin 250mg  tid for 7 days

## 2019-07-28 NOTE — Progress Notes (Signed)
UTI present on urine sample from CPE. Start cephalexin 250mg  tid for 7 days. New prescription sent to [facility].

## 2019-08-17 ENCOUNTER — Telehealth: Payer: Self-pay

## 2019-08-17 NOTE — Telephone Encounter (Signed)
Confirmed appointment with patient. [phone]

## 2019-08-21 ENCOUNTER — Encounter: Payer: Self-pay | Admitting: Internal Medicine

## 2019-08-21 ENCOUNTER — Other Ambulatory Visit: Payer: Self-pay

## 2019-08-21 ENCOUNTER — Ambulatory Visit (INDEPENDENT_AMBULATORY_CARE_PROVIDER_SITE_OTHER): Payer: Medicare Other | Admitting: Internal Medicine

## 2019-08-21 VITALS — BP 140/81 | HR 82 | Temp 97.9°F | Resp 16 | Ht 60.5 in | Wt 147.0 lb

## 2019-08-21 DIAGNOSIS — J4520 Mild intermittent asthma, uncomplicated: Secondary | ICD-10-CM

## 2019-08-21 DIAGNOSIS — G4734 Idiopathic sleep related nonobstructive alveolar hypoventilation: Secondary | ICD-10-CM | POA: Diagnosis not present

## 2019-08-21 DIAGNOSIS — R0602 Shortness of breath: Secondary | ICD-10-CM

## 2019-08-21 DIAGNOSIS — G4719 Other hypersomnia: Secondary | ICD-10-CM | POA: Diagnosis not present

## 2019-08-21 MED ORDER — ALBUTEROL SULFATE HFA 108 (90 BASE) MCG/ACT IN AERS: 2.0000 | INHALATION_SPRAY | RESPIRATORY_TRACT | 5 refills | Status: DC | PRN

## 2019-08-21 NOTE — Progress Notes (Signed)
[facility] [place], [place] [address]  Pulmonary Sleep Medicine   Office Visit Note  Patient Name: Vanessa Evans DOB: Jan 21, 1946 MRN [id number]  Date of Service: 08/21/2019  Complaints/HPI: Pt is here for follow up on on sleep study.  Her sleep study shows an overall ahi of 3.2, which does not indicate sleep apnea.  She did have some significant oxygen desaturation down to 81%.  She continues to report daytime fatigue, and sleepiness.  An overnight oximetry would be useful to see if she would benefit from oxygen at night.   ROS  General: (-) fever, (-) chills, (-) night sweats, (-) weakness Skin: (-) rashes, (-) itching,. Eyes: (-) visual changes, (-) redness, (-) itching. Nose and Sinuses: (-) nasal stuffiness or itchiness, (-) postnasal drip, (-) nosebleeds, (-) sinus trouble. Mouth and Throat: (-) sore throat, (-) hoarseness. Neck: (-) swollen glands, (-) enlarged thyroid, (-) neck pain. Respiratory: - cough, (-) bloody sputum, - shortness of breath, - wheezing. Cardiovascular: - ankle swelling, (-) chest pain. Lymphatic: (-) lymph node enlargement. Neurologic: (-) numbness, (-) tingling. Psychiatric: (-) anxiety, (-) depression   Current Medication: Outpatient Encounter Medications as of 08/21/2019  Medication Sig  . acetaminophen (TYLENOL) 500 MG tablet Take 1,000 mg by mouth 2 (two) times daily as needed for moderate pain.  Marland Kitchen albuterol (PROVENTIL) (2.5 MG/3ML) 0.083% nebulizer solution Take 3 mLs (2.5 mg total) by nebulization every 6 (six) hours as needed for wheezing or shortness of breath.  Marland Kitchen albuterol (VENTOLIN HFA) 108 (90 Base) MCG/ACT inhaler Inhale 2 puffs into the lungs every 4 (four) hours as needed for wheezing or shortness of breath.  Marland Kitchen aspirin EC 81 MG tablet Take 81 mg by mouth at bedtime.   . benzonatate (TESSALON) 100 MG capsule Take 2 capsules (200 mg total) by mouth 2 (two) times daily as needed for cough.  Marland Kitchen BLACK ELDERBERRY  PO Take 1 Dose by mouth 2 (two) times a day.  . Calcium Carbonate-Vitamin D (CALCIUM 600+D) 600-400 MG-UNIT tablet Take 1 tablet by mouth 2 (two) times daily.  . cephALEXin (KEFLEX) 250 MG capsule Take 1 capsule (250 mg total) by mouth 3 (three) times daily.  . cholecalciferol (VITAMIN D) 1000 UNITS tablet Take 1,000 Units by mouth at bedtime.   . citalopram (CELEXA) 10 MG tablet Take 1 tablet (10 mg total) by mouth at bedtime.  . cyanocobalamin 500 MCG tablet Take 500 mcg by mouth daily.  Marland Kitchen Dextran 70-Hypromellose (ARTIFICIAL TEARS) 0.1-0.3 % SOLN Apply 1-2 drops to eye daily.  Marland Kitchen diltiazem (CARDIZEM CD) 120 MG 24 hr capsule Take 120 mg by mouth at bedtime.   Marland Kitchen diltiazem (TIAZAC) 120 MG 24 hr capsule Take 1 capsule by mouth daily.  . diphenhydrAMINE (BENADRYL) 25 MG tablet Take 25-50 mg by mouth daily as needed for itching.  . docusate sodium (COLACE) 100 MG capsule Take 200 mg by mouth at bedtime as needed for moderate constipation.   . DULoxetine (CYMBALTA) 20 MG capsule Take 1 capsule (20 mg total) by mouth daily.  Marland Kitchen ELDERBERRY PO Take 1 tablet by mouth 2 (two) times daily.  Marland Kitchen EPINEPHrine (EPIPEN 2-PAK) 0.3 mg/0.3 mL IJ SOAJ injection use as directed for severe allergy reaction  . estradiol (ESTRACE VAGINAL) 0.1 MG/GM vaginal cream Apply a pea-sized amount to urethra at bedtime  . fenofibrate 160 MG tablet take1 tab po daily for chol  . folic acid (FOLVITE) [id number] MCG tablet Take 400 mcg by mouth daily.  Marland Kitchen  guaiFENesin (MUCINEX) 600 MG 12 hr tablet Take 600 mg by mouth at bedtime as needed (congestion).  . Hypromellose (ARTIFICIAL TEARS OP) Place 1-2 drops into both eyes daily as needed (dry eyes).  Marland Kitchen ketotifen (ZADITOR) 0.025 % ophthalmic solution Place 1 drop into both eyes 2 (two) times daily as needed (allergies).  Marland Kitchen levocetirizine (XYZAL) 5 MG tablet Take 1 tablet (5 mg total) by mouth every evening.  Marland Kitchen levothyroxine (SYNTHROID, LEVOTHROID) 50 MCG tablet Take 50 mcg by mouth daily before  breakfast. Brand Name only  . Magnesium 250 MG TABS Take 1 tablet by mouth daily.  . Melatonin 5 MG TABS Take 1 tablet by mouth at bedtime. For sleep   . Multiple Vitamins-Minerals (MULTIVITAMIN WITH MINERALS) tablet Take 1 tablet by mouth daily.  . pantoprazole (PROTONIX) 40 MG tablet Take 1 tablet (40 mg total) by mouth 2 (two) times daily.  Marland Kitchen Phenylephrine-guaiFENesin 10-400 MG TABS Take 1 tablet by mouth every 4 (four) hours as needed (congestion).  . predniSONE (STERAPRED UNI-PAK 48 TAB) 10 MG (48) TBPK tablet 12 day taper - take by mouth as directed for 12 days  . pyridoxine (B-6) 100 MG tablet Take 100 mg by mouth daily.  . sodium chloride (OCEAN) 0.65 % SOLN nasal spray Place 1 spray into both nostrils 2 (two) times daily.  Marland Kitchen Specialty Vitamins Products (ECHINACEA C COMPLETE PO) Take by mouth.  . Vitamin A 7.5 MG (25000 UT) CAPS Take 1 tablet by mouth 2 (two) times daily.  Marland Kitchen zinc gluconate 50 MG tablet Take 50 mg by mouth daily.   No facility-administered encounter medications on file as of 08/21/2019.    Surgical History: Past Surgical History:  Procedure Laterality Date  . 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 BIOPSY THYROID  2018  . bladder biopsies  2003   9 biopsies done by dr. cope.  all benign.  inflammatory process going on in bladder  . BREAST BIOPSY Right    benign  . BREAST SURGERY Right    lumpectomy  . CATARACT EXTRACTION W/PHACO Left 12/25/2014   Procedure: CATARACT EXTRACTION PHACO AND INTRAOCULAR LENS PLACEMENT (IOC);  Surgeon: Birder Robson, MD;  Location: [facility] ORS;  Service: Ophthalmology;  Laterality: Left;  [place] 00:32 AP% 20.0 CDE 6.49  . COLONOSCOPY W/ BIOPSIES    . COLONOSCOPY W/ POLYPECTOMY  2002, 2004, 2005   adenomatous polyps removed  . COLONOSCOPY WITH PROPOFOL N/A 08/23/2017   Procedure: COLONOSCOPY WITH PROPOFOL;  Surgeon: Manya Silvas, MD;  Location: [facility] ENDOSCOPY;  Service:  Endoscopy;  Laterality: N/A;  . COLONOSCOPY WITH PROPOFOL N/A 11/08/2017   Procedure: COLONOSCOPY WITH PROPOFOL;  Surgeon: Manya Silvas, MD;  Location[facility];  Service: Endoscopy;  Laterality: N/A;  . CYSTOCELE REPAIR N/A 04/09/2016   Procedure: ANTERIOR REPAIR (CYSTOCELE);  Surgeon: Gae Dry, MD;  Location: [facility] ORS;  Service: Gynecology;  Laterality: N/A;  . DIAGNOSTIC LAPAROSCOPY  2008   removed both ovaries with cysts, tubes and fibroids  . DILATION AND CURETTAGE OF UTERUS  1973  . ESOPHAGOGASTRODUODENOSCOPY (EGD) WITH PROPOFOL N/A 08/23/2017   Procedure: ESOPHAGOGASTRODUODENOSCOPY (EGD) WITH PROPOFOL;  Surgeon: Manya Silvas, MD;  Location: [facility] END[facility]COPY;  Service: Endoscopy;  Laterality: N/A;  . EYE SURGERY Bilateral 2016  . HERNIA REPAIR Right 2008   Inguinal hernia Repair, ventral hernia repair  . JOINT REPLACEMENT Right 2008   knee  . KNEE  ARTHROSCOPY Right   . LUMBAR LAMINECTOMY/DECOMPRESSION MICRODISCECTOMY Right 03/21/2013   Procedure: Right Lumbar five-Sacral one Laminectomy for Synovial Cyst;  Surgeon: Faythe Ghee, MD;  Location: [facility] NEURO ORS;  Service: Neurosurgery;  Laterality: Right;  right  . OOPHORECTOMY    . REVERSE SHOULDER ARTHROPLASTY Right 02/22/2018   Procedure: REVERSE SHOULDER ARTHROPLASTY;  Surgeon: Corky Mull, MD;  Location: [facility] ORS;  Service: Orthopedics;  Laterality: Right;  . TUBAL LIGATION    . UNILATERAL SALPINGECTOMY Left 04/09/2016   Procedure: UNILATERAL SALPINGECTOMY;  Surgeon: Gae Dry, MD;  Location: [facility] ORS;  Service: Gynecology;  Laterality: Left;  . UPPER GI ENDOSCOPY  2010   with biopsy of gastric erosion  . VAGINAL HYSTERECTOMY N/A 04/09/2016   Procedure: HYSTERECTOMY VAGINAL;  Surgeon: Gae Dry, MD;  Location: [facility] ORS;  Service: Gynecology;  Laterality: N/A;  . VAGINAL HYSTERECTOMY  2017   and bladder tack    Medical History: Past Medical History:  Diagnosis Date  . Arthritis    osteo  .  Asthma    needs rescue inhaler few times a year  . Depression   . Dizziness    light headed spells but it has been a while  . Dysrhythmia    tachycardia.(cardizem). brady during procedures  . GERD (gastroesophageal reflux disease)    gastritis  . Headache(784.0)   . Heart murmur 2019   aortic. dr. Nehemiah Massed not worried about this  . Hyperlipidemia   . Hypertension   . Hypothyroidism   . Migraine   . Orthopnea   . Pneumonia    long time ago (greater than 5 years ago)  . PONV (postoperative nausea and vomiting)    very anxious during cataract surgery. brady during colonoscopy  . Shortness of breath    any exertion, cannot lie flat    Family History: Family History  Problem Relation Age of Onset  . Pulmonary fibrosis Mother   . Melanoma Father   . Cardiomyopathy Sister        and arrhythmia  . Breast cancer Cousin        paternal 1st    Social History: Social History   Socioeconomic History  . Marital status: Married    Spouse name: Not on file  . Number of children: Not on file  . Years of education: Not on file  . Highest education level: Not on file  Occupational History  . Not on file  Tobacco Use  . Smoking status: Former Smoker    Types: Cigarettes    Quit date: 1967    Years since quitting: 54.0  . Smokeless tobacco: Never Used  Substance and Sexual Activity  . Alcohol use: Not Currently  . Drug use: No  . Sexual activity: Not Currently  Other Topics Concern  . Not on file  Social History Narrative  . Not on file   Social Determinants of Health   Financial Resource Strain:   . Difficulty of Paying Living Expenses: Not on file  Food Insecurity:   . Worried About Charity fundraiser in the Last Year: Not on file  . Ran Out of Food in the Last Year: Not on file  Transportation Needs:   . Lack of Transportation (Medical): Not on file  . Lack of Transportation (Non-Medical): Not on file  Physical Activity:   . Days of Exercise per Week: Not on  file  . Minutes of Exercise per Session: Not on file  Stress:   . Feeling  of Stress : Not on file  Social Connections:   . Frequency of Communication with Friends and Family: Not on file  . Frequency of Social Gatherings with Friends and Family: Not on file  . Attends Religious Services: Not on file  . Active Member of Clubs or Organizations: Not on file  . Attends Archivist Meetings: Not on file  . Marital Status: Not on file  Intimate Partner Violence:   . Fear of Current or Ex-Partner: Not on file  . Emotionally Abused: Not on file  . Physically Abused: Not on file  . Sexually Abused: Not on file    Vital Signs: Blood pressure 140/81, pulse 82, temperature 97.9 F (36.6 C), resp. rate 16, height 5' 0.5" (1.537 m), weight 147 lb (66.7 kg), SpO2 95 %.  Examination: General Appearance: The patient is well-developed, well-nourished, and in no distress. Skin: Gross inspection of skin unremarkable. Head: normocephalic, no gross deformities. Eyes: no gross deformities noted. ENT: ears appear grossly normal no exudates. Neck: Supple. No thyromegaly. No LAD. Respiratory: clear bilaterally. Cardiovascular: Normal S1 and S2 without murmur or rub. Extremities: No cyanosis. pulses are equal. Neurologic: Alert and oriented. No involuntary movements.  LABS: Recent Results (from the past 2160 hour(s))  Novel Coronavirus, NAA ([facility])     Status: None   Collection Time: 05/26/19 10:48 AM   Specimen: Nasopharyngeal(NP) swabs in vial transport medium   NASOPHARYNGE  TESTING  Result Value Ref Range   SARS-CoV-2, NAA Not Detected Not Detected    Comment: This nucleic acid amplification test was developed and its performance characteristics determined by [employer]. Nucleic acid amplification tests include PCR and TMA. This test has not been FDA cleared or approved. This test has been authorized by FDA under an Emergency Use Authorization (EUA). This test is only  authorized for the duration of time the declaration that circumstances exist justifying the authorization of the emergency use of in vitro diagnostic tests for detection of SARS-CoV-2 virus and/or diagnosis of COVID-19 infection under section 564(b)(1) of the Act, 21 U.S.C. [id number]) (1), unless the authorization is terminated or revoked sooner. When diagnostic testing is negative, the possibility of a false negative result should be considered in the context of a patient's recent exposures and the presence of clinical signs and symptoms consistent with COVID-19. An individual without symptoms of COVID-19 and who is not shedding SARS-CoV-2 virus would  expect to have a negative (not detected) result in this assay.   POCT urinalysis dipstick     Status: None   Collection Time: 06/09/19  2:27 PM  Result Value Ref Range   Color, UA     Clarity, UA     Glucose, UA Negative Negative   Bilirubin, UA Negative    Ketones, UA Negative    Spec Grav, UA 1.010 1.010 - 1.025   Blood, UA Negative    pH, UA 6.0 5.0 - 8.0   Protein, UA Negative Negative   Urobilinogen, UA 0.2 0.2 or 1.0 E.U./dL   Nitrite, UA negative    Leukocytes, UA Negative Negative   Appearance     Odor    Iron and TIBC     Status: Abnormal   Collection Time: 07/21/19 11:56 AM  Result Value Ref Range   Iron 101 28 - 170 ug/dL   TIBC 477 (H) 250 - 450 ug/dL   Saturation Ratios 21 10.4 - 31.8 %   UIBC 376 ug/dL    Comment: Performed at [facility],  [place], [place] [address]  Ferritin     Status: None   Collection Time: 07/21/19 11:56 AM  Result Value Ref Range   Ferritin 99 11 - 307 ng/mL    Comment: Performed at [facility], [address] [place], [place] [address]  CBC with Differential/Platelet     Status: Abnormal   Collection Time: 07/21/19 11:56 AM  Result Value Ref Range   WBC 8.0 4.0 - 10.5 K/uL   RBC 4.40 3.87 - 5.11 MIL/uL   Hemoglobin 12.9 12.0 - 15.0 g/dL    HCT 40.6 36.0 - 46.0 %   MCV 92.3 80.0 - 100.0 fL   MCH 29.3 26.0 - 34.0 pg   MCHC 31.8 30.0 - 36.0 g/dL   RDW 13.0 11.5 - 15.5 %   Platelets 343 150 - 400 K/uL   nRBC 0.0 0.0 - 0.2 %   Neutrophils Relative % 61 %   Neutro Abs 4.9 1.7 - 7.7 K/uL   Lymphocytes Relative 24 %   Lymphs Abs 1.9 0.7 - 4.0 K/uL   Monocytes Relative 12 %   Monocytes Absolute 0.9 0.1 - 1.0 K/uL   Eosinophils Relative 1 %   Eosinophils Absolute 0.1 0.0 - 0.5 K/uL   Basophils Relative 1 %   Basophils Absolute 0.1 0.0 - 0.1 K/uL   Immature Granulocytes 1 %   Abs Immature Granulocytes 0.11 (H) 0.00 - 0.07 K/uL    Comment: Performed at [facility], [address] [place], [place] [address]  UA/M w/rflx Culture, Routine     Status: Abnormal   Collection Time: 07/24/19 10:00 AM   Specimen: Urine   URINE  Result Value Ref Range   Specific Gravity, UA 1.014 1.005 - 1.030   pH, UA 7.0 5.0 - 7.5   Color, UA Yellow Yellow   Appearance Ur Clear Clear   Leukocytes,UA Trace (A) Negative   Protein,UA Negative Negative/Trace   Glucose, UA Negative Negative   Ketones, UA Negative Negative   RBC, UA Negative Negative   Bilirubin, UA Negative Negative   Urobilinogen, Ur 0.2 0.2 - 1.0 mg/dL   Nitrite, UA Negative Negative   Microscopic Examination See below:     Comment: Microscopic was indicated and was performed.   Urinalysis Reflex Comment     Comment: This specimen has reflexed to a Urine Culture.  Microscopic Examination     Status: None   Collection Time: 07/24/19 10:00 AM   URINE  Result Value Ref Range   WBC, UA 0-5 0 - 5 /hpf   RBC 0-2 0 - 2 /hpf   Epithelial Cells (non renal) 0-10 0 - 10 /hpf   Casts None seen None seen /lpf   Bacteria, UA Few None seen/Few  Urine Culture, Reflex     Status: Abnormal   Collection Time: 07/24/19 10:00 AM   URINE  Result Value Ref Range   Urine Culture, Routine Final report (A)    Organism ID, Bacteria Escherichia coli (A)     Comment: Greater than 100,000  colony forming units per mL Cefazolin <=4 ug/mL Cefazolin with an MIC <=16 predicts susceptibility to the oral agents cefaclor, cefdinir, cefpodoxime, cefprozil, cefuroxime, cephalexin, and loracarbef when used for therapy of uncomplicated urinary tract infections due to E. coli, Klebsiella pneumoniae, and Proteus mirabilis.    ORGANISM ID, BACTERIA Comment     Comment: Mixed urogenital flora 50,000-100,000 colony forming units per mL    Antimicrobial Susceptibility Comment  Comment:       ** S = Susceptible; I = Intermediate; R = Resistant **                    P = Positive; N = Negative             MICS are expressed in micrograms per mL    Antibiotic                 RSLT#1    RSLT#2    RSLT#3    RSLT#4 Amoxicillin/Clavulanic Acid    I Ampicillin                     R Cefepime                       S Ceftriaxone                    S Cefuroxime                     S Ciprofloxacin                  S Ertapenem                      S Gentamicin                     S Imipenem                       S Levofloxacin                   S Meropenem                      S Nitrofurantoin                 S Piperacillin/Tazobactam        S Tetracycline                   S Tobramycin                     S Trimethoprim/Sulfa             S     Radiology: MR LUMBAR SPINE WO CONTRAST  Result Date: 09/30/2018 CLINICAL DATA:  Chronic and worsening low back and coccygeal pain. History lumbar surgery 03/21/2013. EXAM: MRI LUMBAR SPINE WITHOUT CONTRAST TECHNIQUE: Multiplanar, multisequence MR imaging of the lumbar spine was performed. No intravenous contrast was administered. COMPARISON:  CT abdomen and pelvis 12/15/2017. FINDINGS: Segmentation:  Standard. Alignment: 0.8 cm anterolisthesis L5 on S1 is due to a unilateral pars interarticularis defect on the right and facet arthropathy on left and unchanged. Alignment is otherwise maintained. Vertebrae: No fracture or worrisome lesion. Degenerative  endplate signal change is most notable at L3-4. Conus medullaris and cauda equina: Conus extends to the L1 level. Conus and cauda equina appear normal. Paraspinal and other soft tissues: Negative. Disc levels: T10-11 and T11-12 are imaged in the sagittal plane only and negative. T12-L1: Negative. L1-2: Negative. L2-3: Mild facet degenerative change and a minimal bulge. No stenosis. L3-4: There is loss of disc space height with a shallow broad-based disc bulge, ligamentum flavum thickening and mild facet arthropathy. Mild central canal narrowing is seen. The foramina are open. L4-5: Shallow disc bulge, ligamentum flavum  thickening and mild to moderate facet arthropathy. There is mild central canal stenosis and mild narrowing in the right subarticular recess. The foramina are open. L5-S1: Right laminotomy defect is identified. The disc is uncovered without bulging. The central canal and foramina are open. IMPRESSION: Mild central canal stenosis at L3-4 due to ligamentum flavum thickening and a shallow disc bulge. Mild central canal and right subarticular recess narrowing at L4-5 due to a shallow disc bulge and ligamentum flavum thickening. Status post right laminotomy at L5-S1. The central canal and foramina are open. 0.8 cm anterolisthesis L5 on S1 due to facet arthropathy on the left and a unilateral pars interarticularis defect on the right is unchanged. Electronically Signed   By: Inge Rise M.D.   On: 09/30/2018 11:20    No results found.  No results found.    Assessment and Plan: Patient Active Problem List   Diagnosis Date Noted  . Inflammatory polyarthritis ([place]) 07/26/2019  . Excessive daytime sleepiness 07/26/2019  . Encounter for screening mammogram for malignant neoplasm of breast 07/26/2019  . Intercostal muscle pain 06/25/2019  . Positive ANA (antinuclear antibody) 05/01/2019  . Primary osteoarthritis involving multiple joints 04/26/2019  . Mild intermittent asthma without  complication 0000000  . Seasonal allergic rhinitis due to pollen 08/22/2018  . Chills with fever 08/11/2018  . Sore throat 08/11/2018  . Acute upper respiratory infection 08/11/2018  . Candidiasis 08/11/2018  . Iron deficiency anemia 05/05/2018  . Right hip pain 04/27/2018  . Accidental fall from furniture 04/27/2018  . Vitamin D deficiency 04/27/2018  . Cough 04/18/2018  . Dysuria 04/18/2018  . Status post reverse total shoulder replacement, right 02/22/2018  . Abnormal ECG 02/18/2018  . Pain in joint of right shoulder 02/13/2018  . Abdominal aortic atherosclerosis ([place]) 02/13/2018  . Episode of recurrent major depressive disorder ([place]) 02/13/2018  . Encounter for general adult medical examination with abnormal findings 02/13/2018  . Lipoma of right shoulder 01/31/2018  . Rotator cuff tendinitis, right 01/31/2018  . Chronic superficial gastritis without bleeding 12/12/2017  . Schatzki's ring 12/12/2017  . Acute gastritis 11/08/2017  . Nausea 10/18/2017  . Other fatigue 10/18/2017  . Allergic rhinitis 09/20/2017  . Eczema 09/20/2017  . Hematuria 09/20/2017  . Mixed hyperlipidemia 09/20/2017  . Osteoarthritis 09/20/2017  . Osteoporosis, post-menopausal 09/20/2017  . Palpitations 07/05/2017  . Epigastric pain 05/24/2017  . Gastroesophageal reflux disease without esophagitis 05/24/2017  . Impingement syndrome of left shoulder 10/02/2016  . Trochanteric bursitis of left hip 10/02/2016  . SOB (shortness of breath) 04/16/2016  . Uterine prolapse 04/09/2016  . Cystocele 04/09/2016  . Hyponatremia 04/09/2016  . Benign essential HTN 11/18/2015  . Paroxysmal supraventricular tachycardia ([place]) 11/18/2015  . Premature ventricular contraction 07/20/2014  . History of colonic polyps 05/29/2014  . Asthma 02/05/2014  . Chest pain 02/05/2014  . Increased frequency of urination 02/05/2014  . Tachycardia 02/05/2014  . Female stress incontinence 12/14/2013  . Gross hematuria  12/14/2013  . Incomplete emptying of bladder 12/14/2013  . Other chronic cystitis without hematuria 12/14/2013    1. Nocturnal hypoxia Will get overnight oximetry to evaluate for need for nocturnal hypoxia.  - Pulse oximetry, overnight; Future  2. Mild intermittent asthma without complication Refilled patients ventolin inhaler.  - albuterol (VENTOLIN HFA) 108 (90 Base) MCG/ACT inhaler; Inhale 2 puffs into the lungs every 4 (four) hours as needed for wheezing or shortness of breath.  Dispense: 18 g; Refill: 5  3. Excessive daytime sleepiness No sleep apnea indicated.  Could be due to nocturnal hypoxia.  Will get overnight oximetry.   4. Shortness of breath - Spirometry with graph  General Counseling: I have discussed the findings of the evaluation and examination with Advika.  I have also discussed any further diagnostic evaluation thatmay be needed or ordered today. Trenika verbalizes understanding of the findings of todays visit. We also reviewed her medications today and discussed drug interactions and side effects including but not limited excessive drowsiness and altered mental states. We also discussed that there is always a risk not just to her but also people around her. she has been encouraged to call the office with any questions or concerns that should arise related to todays visit.  Orders Placed This Encounter  Procedures  . Spirometry with graph    Order Specific Question:   Where should this test be performed?    Answer:   Other     Time spent: 25 This patient was seen by Orson Gear AGNP-C in Collaboration with Dr. Devona Konig as a part of collaborative care agreement.   I have personally obtained a history, examined the patient, evaluated laboratory and imaging results, formulated the assessment and plan and placed orders.    Allyne Gee, MD [facility] Pulmonary and Critical Care Sleep medicine

## 2019-08-23 NOTE — Procedures (Signed)
[facility] [place], [place] [address]  Sleep Specialist: Allyne Gee, MD [place] Sleep Study Interpretation  Patient Name: Vanessa Evans Patient MR [id number] DOB:June 22, 1946  Date of Study: July 27, 2019  Indications for study: Hypersomnia  BMI: 26.4 kg/m       Respiratory Data:  Total AHI: 3.2/h  Total Obstructive Apneas: 0  Total Central Apneas: 0  Total Mixed Apneas: 0  Total Hypopneas: 17  If the AHI is greater than 5 per hour patient qualifies for PAP evaluation  Oximetry Data:  Oxygen Desaturation Index: 3.3/h  Lowest Desaturation: 81%  Cardiac Data:  Minimum Heart Rate: 61  Maximum Heart Rate: 101   Impression / Diagnosis:  This apnea study is within normal limits with no significant obstructive sleep apnea.  There is some oxygen desaturation with the lowest saturation of 81%.  Clinical correlation recommended.  GENERAL Recommendations:  1.  Consider Auto PAP with pressure ranges 5-20 cmH20 with download, or facility based PAP Titration Study  2.  Consider PAP interface mask fitted for patient comfort, Heated Humidification & PAP compliance monitoring (1 month, 3 months & 12 months after PAP initiation)  3. Consider treatment with mandibular advancement splint (MAS) or referral to an ENT surgeon for modification to the upper airway if the patient prefers an alternate therapy or the PAP trial is unsuccessful  4. Sleep hygiene measures should be discussed with the patient  5. Behavioral therapy such as weight reduction or smoking cessation as appropriate for the patient  6. Advise patient against the use of alcohol or sedatives in so much as these substances can worsen excessive daytime sleepiness and respiratory disturbances of sleep  7. Advise patient against participating in potentially dangerous activities while drowsy such as operating a motor vehicle, heavy equipment or power tools as it can  put them and others in danger  8. Advise patient of the long term consequences of OSA if left untreated, need for treatment and close follow up  9. Clinical follow up as deemed necessary     This Level III home sleep study was performed using the [employer], a 4 channel screening device subject to limitations. Depending on actual total sleep time, not measured in this study, the AHI (sum of apneas and hypopneas/hr of sleep) and therefore the severity of sleep apnea may be underestimated. As with any single night study, including Level 1 attended PSG, severity of sleep apnea may also be underestimated due to the lack of supine and/or REM sleep.  The interpretation associated with this report is based on normal values and degrees of severity in accordance with AASM parameters and/or estimated from multiple sources in the literature for adults ages 50-80+. These may not agree with the displayed values. The patient's treating physician should use the interpretation and recommendations in conjunction with the overall clinical evaluation and treatment of the patient.  Some of the terminology used in this scored [facility]nea[facility] report was developed several years ago and may not always be in accordance with current nomenclature. This in no way affects the accuracy of the data or the reliability of the interpretation and recommendations.

## 2019-08-25 DIAGNOSIS — M79602 Pain in left arm: Secondary | ICD-10-CM | POA: Diagnosis not present

## 2019-08-25 DIAGNOSIS — M7582 Other shoulder lesions, left shoulder: Secondary | ICD-10-CM | POA: Diagnosis not present

## 2019-08-25 DIAGNOSIS — M7522 Bicipital tendinitis, left shoulder: Secondary | ICD-10-CM | POA: Diagnosis not present

## 2019-08-25 DIAGNOSIS — M4722 Other spondylosis with radiculopathy, cervical region: Secondary | ICD-10-CM | POA: Diagnosis not present

## 2019-09-01 ENCOUNTER — Telehealth: Payer: Self-pay

## 2019-09-01 DIAGNOSIS — R0902 Hypoxemia: Secondary | ICD-10-CM | POA: Diagnosis not present

## 2019-09-01 NOTE — Telephone Encounter (Signed)
Confirmed virtual visit with patient. klh 

## 2019-09-05 ENCOUNTER — Ambulatory Visit: Payer: Medicare Other | Admitting: Nurse Practitioner

## 2019-09-05 ENCOUNTER — Ambulatory Visit (INDEPENDENT_AMBULATORY_CARE_PROVIDER_SITE_OTHER): Payer: Medicare Other | Admitting: Internal Medicine

## 2019-09-05 ENCOUNTER — Encounter: Payer: Self-pay | Admitting: Internal Medicine

## 2019-09-05 VITALS — BP 141/80 | HR 116 | Ht 60.5 in | Wt 145.6 lb

## 2019-09-05 DIAGNOSIS — K219 Gastro-esophageal reflux disease without esophagitis: Secondary | ICD-10-CM | POA: Diagnosis not present

## 2019-09-05 DIAGNOSIS — G4719 Other hypersomnia: Secondary | ICD-10-CM | POA: Diagnosis not present

## 2019-09-05 DIAGNOSIS — G4734 Idiopathic sleep related nonobstructive alveolar hypoventilation: Secondary | ICD-10-CM

## 2019-09-05 NOTE — Progress Notes (Signed)
[facility] [place], [place] [address]  Internal MEDICINE  Telephone Visit  Patient Name: Vanessa Evans  [id number]  UI:[id number]  Date of Service: 09/05/2019  I connected with the patient at 344 by telephone and verified the patients identity using two identifiers.   I discussed the limitations, risks, security and privacy concerns of performing an evaluation and management service by telephone and the availability of in person appointments. I also discussed with the patient that there may be a patient responsible charge related to the service.  The patient expressed understanding and agrees to proceed.    Chief Complaint  Patient presents with  . Telephone Assessment  . Telephone Screen    need copy of over night oximetry  . Follow-up    overnight oximetry     HPI  Pt is seen via telephone. She had an overnight oximetry. Her overnight shows more than 40 minutes of saturations less than 88%.  She will benefit from nocturnal oxygen at this time.  She continues to reports fatigue, and excessive sleepiness during the day.  Sleep study was recently WNL, no osa noted.     Current Medication: Outpatient Encounter Medications as of 09/05/2019  Medication Sig  . acetaminophen (TYLENOL) 500 MG tablet Take 1,000 mg by mouth 2 (two) times daily as needed for moderate pain.  Marland Kitchen albuterol (PROVENTIL) (2.5 MG/3ML) 0.083% nebulizer solution Take 3 mLs (2.5 mg total) by nebulization every 6 (six) hours as needed for wheezing or shortness of breath.  Marland Kitchen albuterol (VENTOLIN HFA) 108 (90 Base) MCG/ACT inhaler Inhale 2 puffs into the lungs every 4 (four) hours as needed for wheezing or shortness of breath.  Marland Kitchen aspirin EC 81 MG tablet Take 81 mg by mouth at bedtime.   . benzonatate (TESSALON) 100 MG capsule Take 2 capsules (200 mg total) by mouth 2 (two) times daily as needed for cough.  Marland Kitchen BLACK ELDERBERRY PO Take 1 Dose by mouth 2 (two) times a day.  . Calcium  Carbonate-Vitamin D (CALCIUM 600+D) 600-400 MG-UNIT tablet Take 1 tablet by mouth 2 (two) times daily.  . cephALEXin (KEFLEX) 250 MG capsule Take 1 capsule (250 mg total) by mouth 3 (three) times daily.  . cholecalciferol (VITAMIN D) 1000 UNITS tablet Take 1,000 Units by mouth at bedtime.   . citalopram (CELEXA) 10 MG tablet Take 1 tablet (10 mg total) by mouth at bedtime.  . cyanocobalamin 500 MCG tablet Take 500 mcg by mouth daily.  Marland Kitchen Dextran 70-Hypromellose (ARTIFICIAL TEARS) 0.1-0.3 % SOLN Apply 1-2 drops to eye daily.  Marland Kitchen diltiazem (CARDIZEM CD) 120 MG 24 hr capsule Take 120 mg by mouth at bedtime.   Marland Kitchen diltiazem (TIAZAC) 120 MG 24 hr capsule Take 1 capsule by mouth daily.  . diphenhydrAMINE (BENADRYL) 25 MG tablet Take 25-50 mg by mouth daily as needed for itching.  . docusate sodium (COLACE) 100 MG capsule Take 200 mg by mouth at bedtime as needed for moderate constipation.   . DULoxetine (CYMBALTA) 20 MG capsule Take 1 capsule (20 mg total) by mouth daily.  Marland Kitchen ELDERBERRY PO Take 1 tablet by mouth 2 (two) times daily.  Marland Kitchen EPINEPHrine (EPIPEN 2-PAK) 0.3 mg/0.3 mL IJ SOAJ injection use as directed for severe allergy reaction  . estradiol (ESTRACE VAGINAL) 0.1 MG/GM vaginal cream Apply a pea-sized amount to urethra at bedtime  . fenofibrate 160 MG tablet take1 tab po daily for chol  . folic acid (FOLVITE) A999333 MCG tablet Take 400 mcg by  mouth daily.  Marland Kitchen guaiFENesin (MUCINEX) 600 MG 12 hr tablet Take 600 mg by mouth at bedtime as needed (congestion).  . Hypromellose (ARTIFICIAL TEARS OP) Place 1-2 drops into both eyes daily as needed (dry eyes).  Marland Kitchen ketotifen (ZADITOR) 0.025 % ophthalmic solution Place 1 drop into both eyes 2 (two) times daily as needed (allergies).  Marland Kitchen levocetirizine (XYZAL) 5 MG tablet Take 1 tablet (5 mg total) by mouth every evening.  Marland Kitchen levothyroxine (SYNTHROID, LEVOTHROID) 50 MCG tablet Take 50 mcg by mouth daily before breakfast. Brand Name only  . Magnesium 250 MG TABS Take 1  tablet by mouth daily.  . Melatonin 5 MG TABS Take 1 tablet by mouth at bedtime. For sleep   . Multiple Vitamins-Minerals (MULTIVITAMIN WITH MINERALS) tablet Take 1 tablet by mouth daily.  . pantoprazole (PROTONIX) 40 MG tablet Take 1 tablet (40 mg total) by mouth 2 (two) times daily.  Marland Kitchen Phenylephrine-guaiFENesin 10-400 MG TABS Take 1 tablet by mouth every 4 (four) hours as needed (congestion).  . predniSONE (STERAPRED UNI-PAK 48 TAB) 10 MG (48) TBPK tablet 12 day taper - take by mouth as directed for 12 days  . pyridoxine (B-6) 100 MG tablet Take 100 mg by mouth daily.  . sodium chloride (OCEAN) 0.65 % SOLN nasal spray Place 1 spray into both nostrils 2 (two) times daily.  Marland Kitchen Specialty Vitamins Products (ECHINACEA C COMPLETE PO) Take by mouth.  . Vitamin A 7.5 MG (25000 UT) CAPS Take 1 tablet by mouth 2 (two) times daily.  Marland Kitchen zinc gluconate 50 MG tablet Take 50 mg by mouth daily.   No facility-administered encounter medications on file as of 09/05/2019.    Surgical History: Past Surgical History:  Procedure Laterality Date  . 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 BIOPSY THYROID  2018  . bladder biopsies  2003   9 biopsies done by dr. cope.  all benign.  inflammatory process going on in bladder  . BREAST BIOPSY Right    benign  . BREAST SURGERY Right    lumpectomy  . CATARACT EXTRACTION W/PHACO Left 12/25/2014   Procedure: CATARACT EXTRACTION PHACO AND INTRAOCULAR LENS PLACEMENT (IOC);  Surgeon: Birder Robson, MD;  Location: [facility] ORS;  Service: Ophthalmology;  Laterality: Left;  [place] 00:32 AP% 20.0 CDE 6.49  . COLONOSCOPY W/ BIOPSIES    . COLONOSCOPY W/ POLYPECTOMY  2002, 2004, 2005   adenomatous polyps removed  . COLONOSCOPY WITH PROPOFOL N/A 08/23/2017   Procedure: COLONOSCOPY WITH PROPOFOL;  Surgeon: Manya Silvas, MD;  Location: [facility] ENDOSCOPY;  Service: Endoscopy;  Laterality: N/A;  . COLONOSCOPY WITH PROPOFOL N/A  11/08/2017   Procedure: COLONOSCOPY WITH PROPOFOL;  Surgeon: Manya Silvas, MD;  Location: [facility] ENDOSCOPY;  Service: Endoscopy;  Laterality: N/A;  . CYSTOCELE REPAIR N/A 04/09/2016   Procedure: ANTERIOR REPAIR (CYSTOCELE);  Surgeon: Gae Dry, MD;  Location: [facility] ORS;  Service: Gynecology;  Laterality: N/A;  . DIAGNOSTIC LAPAROSCOPY  2008   removed both ovaries with cysts, tubes and fibroids  . DILATION AND CURETTAGE OF UTERUS  1973  . ESOPHAGOGASTRODUODENOSCOPY (EGD) WITH PROPOFOL N/A 08/23/2017   Procedure: ESOPHAGOGASTRODUODENOSCOPY (EGD) WITH PROPOFOL;  Surgeon: Manya Silvas, MD;  Location: [facility] ENDOSCOPY;  Service: Endoscopy;  Laterality: N/A;  . EYE SURGERY Bilateral 2016  . HERNIA REPAIR Right 2008   Inguinal hernia Repair, ventral hernia repair  . JOINT REPLACEMENT Right 2008  knee  . KNEE ARTHROSCOPY Right   . LUMBAR LAMINECTOMY/DECOMPRESSION MICRODISCECTOMY Right 03/21/2013   Procedure: Right Lumbar five-Sacral one Laminectomy for Synovial Cyst;  Surgeon: Faythe Ghee, MD;  Location: [facility] NEURO ORS;  Service: Neurosurgery;  Laterality: Right;  right  . OOPHORECTOMY    . REVERSE SHOULDER ARTHROPLASTY Right 02/22/2018   Procedure: REVERSE SHOULDER ARTHROPLASTY;  Surgeon: Corky Mull, MD;  Location: [facility] ORS;  Service: Orthopedics;  Laterality: Right;  . TUBAL LIGATION    . UNILATERAL SALPINGECTOMY Left 04/09/2016   Procedure: UNILATERAL SALPINGECTOMY;  Surgeon: Gae Dry, MD;  Location: [facility] ORS;  Service: Gynecology;  Laterality: Left;  . UPPER GI ENDOSCOPY  2010   with biopsy of gastric erosion  . VAGINAL HYSTERECTOMY N/A 04/09/2016   Procedure: HYSTERECTOMY VAGINAL;  Surgeon: Gae Dry, MD;  Location: [facility] ORS;  Service: Gynecology;  Laterality: N/A;  . VAGINAL HYSTERECTOMY  2017   and bladder tack    Medical History: Past Medical History:  Diagnosis Date  . Arthritis    osteo  . Asthma    needs rescue inhaler few times a year  . Depression   .  Dizziness    light headed spells but it has been a while  . Dysrhythmia    tachycardia.(cardizem). brady during procedures  . GERD (gastroesophageal reflux disease)    gastritis  . Headache(784.0)   . Heart murmur 2019   aortic. dr. Nehemiah Massed not worried about this  . Hyperlipidemia   . Hypertension   . Hypothyroidism   . Migraine   . Orthopnea   . Pneumonia    long time ago (greater than 5 years ago)  . PONV (postoperative nausea and vomiting)    very anxious during cataract surgery. brady during colonoscopy  . Shortness of breath    any exertion, cannot lie flat    Family History: Family History  Problem Relation Age of Onset  . Pulmonary fibrosis Mother   . Melanoma Father   . Cardiomyopathy Sister        and arrhythmia  . Breast cancer Cousin        paternal 1st    Social History   Socioeconomic History  . Marital status: Married    Spouse name: Not on file  . Number of children: Not on file  . Years of education: Not on file  . Highest education level: Not on file  Occupational History  . Not on file  Tobacco Use  . Smoking status: Former Smoker    Types: Cigarettes    Quit date: 1967    Years since quitting: 54.1  . Smokeless tobacco: Never Used  Substance and Sexual Activity  . Alcohol use: Not Currently  . Drug use: No  . Sexual activity: Not Currently  Other Topics Concern  . Not on file  Social History Narrative  . Not on file   Social Determinants of Health   Financial Resource Strain:   . Difficulty of Paying Living Expenses: Not on file  Food Insecurity:   . Worried About Charity fundraiser in the Last Year: Not on file  . Ran Out of Food in the Last Year: Not on file  Transportation Needs:   . Lack of Transportation (Medical): Not on file  . Lack of Transportation (Non-Medical): Not on file  Physical Activity:   . Days of Exercise per Week: Not on file  . Minutes of Exercise per Session: Not on file  Stress:   .  Feeling of Stress :  Not on file  Social Connections:   . Frequency of Communication with Friends and Family: Not on file  . Frequency of Social Gatherings with Friends and Family: Not on file  . Attends Religious Services: Not on file  . Active Member of Clubs or Organizations: Not on file  . Attends Archivist Meetings: Not on file  . Marital Status: Not on file  Intimate Partner Violence:   . Fear of Current or Ex-Partner: Not on file  . Emotionally Abused: Not on file  . Physically Abused: Not on file  . Sexually Abused: Not on file      Review of Systems  Constitutional: Negative for chills, fatigue and unexpected weight change.  HENT: Negative for congestion, rhinorrhea, sneezing and sore throat.   Eyes: Negative for photophobia, pain and redness.  Respiratory: Negative for cough, chest tightness and shortness of breath.   Cardiovascular: Negative for chest pain and palpitations.  Gastrointestinal: Negative for abdominal pain, constipation, diarrhea, nausea and vomiting.  Endocrine: Negative.   Genitourinary: Negative for dysuria and frequency.  Musculoskeletal: Negative for arthralgias, back pain, joint swelling and neck pain.  Skin: Negative for rash.  Allergic/Immunologic: Negative.   Neurological: Negative for tremors and numbness.  Hematological: Negative for adenopathy. Does not bruise/bleed easily.  Psychiatric/Behavioral: Negative for behavioral problems and sleep disturbance. The patient is not nervous/anxious.     Vital Signs: BP (!) 141/80   Pulse (!) 116   Ht 5' 0.5" (1.537 m)   Wt 145 lb 9.6 oz (66 kg)   BMI 27.97 kg/m    Observation/Objective: Well sounding, NAD noted.    Assessment/Plan: 1. Nocturnal hypoxia Use oxygen at night, ordered today, 2 lpm.   - For home use only DME oxygen  2. Excessive daytime sleepiness NO OSA, likely from nocturnal hypoxia.  Follow up after oxygen.  3. Gastroesophageal reflux disease without esophagitis Stable, continue  present management  General Counseling: Herma verbalizes understanding of the findings of today's phone visit and agrees with plan of treatment. I have discussed any further diagnostic evaluation that may be needed or ordered today. We also reviewed her medications today. she has been encouraged to call the office with any questions or concerns that should arise related to todays visit.    Orders Placed This Encounter  Procedures  . For home use only DME oxygen    No orders of the defined types were placed in this encounter.   Time spent: 15 Minutes    Orson Gear AGNP-C Pulmonary Medicine.

## 2019-09-08 ENCOUNTER — Telehealth: Payer: Self-pay

## 2019-09-08 NOTE — Telephone Encounter (Signed)
CMN SIGNED AND PLACED IN [place] FOLDER. °

## 2019-09-13 NOTE — Progress Notes (Signed)
May qualify for nocturnal oxygen. Sees Dr. Devona Konig in March. Review with patient 09/19/2019

## 2019-09-19 ENCOUNTER — Ambulatory Visit: Payer: Medicare Other | Admitting: Nurse Practitioner

## 2019-09-21 ENCOUNTER — Ambulatory Visit (INDEPENDENT_AMBULATORY_CARE_PROVIDER_SITE_OTHER): Payer: Medicare Other | Admitting: Nurse Practitioner

## 2019-09-21 ENCOUNTER — Encounter: Payer: Self-pay | Admitting: Nurse Practitioner

## 2019-09-21 ENCOUNTER — Other Ambulatory Visit: Payer: Self-pay

## 2019-09-21 VITALS — BP 151/86 | HR 109 | Temp 97.2°F | Resp 16 | Ht 60.5 in | Wt 148.0 lb

## 2019-09-21 DIAGNOSIS — J301 Allergic rhinitis due to pollen: Secondary | ICD-10-CM

## 2019-09-21 DIAGNOSIS — M064 Inflammatory polyarthropathy: Secondary | ICD-10-CM | POA: Diagnosis not present

## 2019-09-21 DIAGNOSIS — I10 Essential (primary) hypertension: Secondary | ICD-10-CM

## 2019-09-21 DIAGNOSIS — G4719 Other hypersomnia: Secondary | ICD-10-CM

## 2019-09-21 DIAGNOSIS — J4520 Mild intermittent asthma, uncomplicated: Secondary | ICD-10-CM | POA: Diagnosis not present

## 2019-09-21 MED ORDER — DULOXETINE HCL 30 MG PO CPEP: 30.0000 mg | ORAL_CAPSULE | Freq: Every day | ORAL | 3 refills | Status: DC

## 2019-09-21 NOTE — Progress Notes (Signed)
[facility] [place], [place] [address]  Internal MEDICINE  Office Visit Note  Patient Name: Vanessa Evans  [id number]  [id number]  Date of Service: 09/24/2019  Chief Complaint  Patient presents with  . Follow-up    added citalopram and duloxetine, sees no difference with pain     The patient is here for follow up visit. She continues to have moderate to severe fatigue as well as generalized joint pain. Labs and studies have been negative for acute abnormalities. At my most recent visit with her, I changed her antidepressant to duloxetine 20mg  to help with joint pain as well as fatigue. She states that she has noted any difference in her symptoms with this change in medications. Has no negative side effects to report.       Current Medication: Outpatient Encounter Medications as of 09/21/2019  Medication Sig Note  . acetaminophen (TYLENOL) 500 MG tablet Take 1,000 mg by mouth 2 (two) times daily as needed for moderate pain.   Marland Kitchen albuterol (PROVENTIL) (2.5 MG/3ML) 0.083% nebulizer solution Take 3 mLs (2.5 mg total) by nebulization every 6 (six) hours as needed for wheezing or shortness of breath.   Marland Kitchen albuterol (VENTOLIN HFA) 108 (90 Base) MCG/ACT inhaler Inhale 2 puffs into the lungs every 4 (four) hours as needed for wheezing or shortness of breath.   Marland Kitchen aspirin EC 81 MG tablet Take 81 mg by mouth at bedtime.    . benzonatate (TESSALON) 100 MG capsule Take 2 capsules (200 mg total) by mouth 2 (two) times daily as needed for cough.   Marland Kitchen BLACK ELDERBERRY PO Take 1 Dose by mouth 2 (two) times a day.   . Calcium Carbonate-Vitamin D (CALCIUM 600+D) 600-400 MG-UNIT tablet Take 1 tablet by mouth 2 (two) times daily.   . cholecalciferol (VITAMIN D) 1000 UNITS tablet Take 1,000 Units by mouth at bedtime.    . cyanocobalamin 500 MCG tablet Take 500 mcg by mouth daily.   Marland Kitchen Dextran 70-Hypromellose (ARTIFICIAL TEARS) 0.1-0.3 % SOLN Apply 1-2 drops to eye daily.   Marland Kitchen  diltiazem (CARDIZEM CD) 120 MG 24 hr capsule Take 120 mg by mouth at bedtime.    Marland Kitchen diltiazem (TIAZAC) 120 MG 24 hr capsule Take 1 capsule by mouth daily.   . diphenhydrAMINE (BENADRYL) 25 MG tablet Take 25-50 mg by mouth daily as needed for itching.   . docusate sodium (COLACE) 100 MG capsule Take 200 mg by mouth at bedtime as needed for moderate constipation.    . DULoxetine (CYMBALTA) 30 MG capsule Take 1 capsule (30 mg total) by mouth daily.   Marland Kitchen ELDERBERRY PO Take 1 tablet by mouth 2 (two) times daily.   Marland Kitchen EPINEPHrine (EPIPEN 2-PAK) 0.3 mg/0.3 mL IJ SOAJ injection use as directed for severe allergy reaction   . estradiol (ESTRACE VAGINAL) 0.1 MG/GM vaginal cream Apply a pea-sized amount to urethra at bedtime   . fenofibrate 160 MG tablet take1 tab po daily for chol   . folic acid (FOLVITE) A999333 MCG tablet Take 400 mcg by mouth daily.   Marland Kitchen guaiFENesin (MUCINEX) 600 MG 12 hr tablet Take 600 mg by mouth at bedtime as needed (congestion).   . Hypromellose (ARTIFICIAL TEARS OP) Place 1-2 drops into both eyes daily as needed (dry eyes).   Marland Kitchen ketotifen (ZADITOR) 0.025 % ophthalmic solution Place 1 drop into both eyes 2 (two) times daily as needed (allergies).   Marland Kitchen levocetirizine (XYZAL) 5 MG tablet Take 1 tablet (5 mg  total) by mouth every evening.   Marland Kitchen levothyroxine (SYNTHROID, LEVOTHROID) 50 MCG tablet Take 50 mcg by mouth daily before breakfast. Brand Name only   . Magnesium 250 MG TABS Take 1 tablet by mouth daily.   . Melatonin 5 MG TABS Take 1 tablet by mouth at bedtime. For sleep    . Multiple Vitamins-Minerals (MULTIVITAMIN WITH MINERALS) tablet Take 1 tablet by mouth daily.   . pantoprazole (PROTONIX) 40 MG tablet Take 1 tablet (40 mg total) by mouth 2 (two) times daily.   Marland Kitchen Phenylephrine-guaiFENesin 10-400 MG TABS Take 1 tablet by mouth every 4 (four) hours as needed (congestion).   . predniSONE (STERAPRED UNI-PAK 48 TAB) 10 MG (48) TBPK tablet 12 day taper - take by mouth as directed for 12  days   . pyridoxine (B-6) 100 MG tablet Take 100 mg by mouth daily.   . sodium chloride (OCEAN) 0.65 % SOLN nasal spray Place 1 spray into both nostrils 2 (two) times daily.   Marland Kitchen Specialty Vitamins Products (ECHINACEA C COMPLETE PO) Take by mouth.   . Vitamin A 7.5 MG (25000 UT) CAPS Take 1 tablet by mouth 2 (two) times daily.   Marland Kitchen zinc gluconate 50 MG tablet Take 50 mg by mouth daily.   . [DISCONTINUED] cephALEXin (KEFLEX) 250 MG capsule Take 1 capsule (250 mg total) by mouth 3 (three) times daily.   . [DISCONTINUED] citalopram (CELEXA) 10 MG tablet Take 1 tablet (10 mg total) by mouth at bedtime. 09/21/2019: patient now taking duloxetine.   . [DISCONTINUED] DULoxetine (CYMBALTA) 20 MG capsule Take 1 capsule (20 mg total) by mouth daily.    No facility-administered encounter medications on file as of 09/21/2019.    Surgical History: Past Surgical History:  Procedure Laterality Date  . 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 BIOPSY THYROID  2018  . bladder biopsies  2003   9 biopsies done by dr. cope.  all benign.  inflammatory process going on in bladder  . BREAST BIOPSY Right    benign  . BREAST SURGERY Right    lumpectomy  . CATARACT EXTRACTION W/PHACO Left 12/25/2014   Procedure: CATARACT EXTRACTION PHACO AND INTRAOCULAR LENS PLACEMENT (IOC);  Surgeon: Birder Robson, MD;  Location: [facility] ORS;  Service: Ophthalmology;  Laterality: Left;  [place] 00:32 AP% 20.0 CDE 6.49  . COLONOSCOPY W/ BIOPSIES    . COLONOSCOPY W/ POLYPECTOMY  2002, 2004, 2005   adenomatous polyps removed  . COLONOSCOPY WITH PROPOFOL N/A 08/23/2017   Procedure: COLONOSCOPY WITH PROPOFOL;  Surgeon: Manya Silvas, MD;  Location: [facility] ENDOSCOPY;  Service: Endoscopy;  Laterality: N/A;  . COLONOSCOPY WITH PROPOFOL N/A 11/08/2017   Procedure: COLONOSCOPY WITH PROPOFOL;  Surgeon: Manya Silvas, MD;  Location: [facility] ENDOSCOPY;  Service: Endoscopy;  Laterality: N/A;   . CYSTOCELE REPAIR N/A 04/09/2016   Procedure: ANTERIOR REPAIR (CYSTOCELE);  Surgeon: Gae Dry, MD;  Location: [facility] ORS;  Service: Gynecology;  Laterality: N/A;  . DIAGNOSTIC LAPAROSCOPY  2008   removed both ovaries with cysts, tubes and fibroids  . DILATION AND CURETTAGE OF UTERUS  1973  . ESOPHAGOGASTRODUODENOSCOPY (EGD) WITH PROPOFOL N/A 08/23/2017   Procedure: ESOPHAGOGASTRODUODENOSCOPY (EGD) WITH PROPOFOL;  Surgeon: Manya Silvas, MD;  Location: [facility] ENDOSCOPY;  Service: Endoscopy;  Laterality: N/A;  . EYE SURGERY Bilateral 2016  . HERNIA REPAIR Right 2008   Inguinal hernia Repair, ventral hernia repair  . JOINT  REPLACEMENT Right 2008   knee  . KNEE ARTHROSCOPY Right   . LUMBAR LAMINECTOMY/DECOMPRESSION MICRODISCECTOMY Right 03/21/2013   Procedure: Right Lumbar five-Sacral one Laminectomy for Synovial Cyst;  Surgeon: Faythe Ghee, MD;  Location: [facility] NEURO ORS;  Service: Neurosurgery;  Laterality: Right;  right  . OOPHORECTOMY    . REVERSE SHOULDER ARTHROPLASTY Right 02/22/2018   Procedure: REVERSE SHOULDER ARTHROPLASTY;  Surgeon: Corky Mull, MD;  Location: [facility] ORS;  Service: Orthopedics;  Laterality: Right;  . TUBAL LIGATION    . UNILATERAL SALPINGECTOMY Left 04/09/2016   Procedure: UNILATERAL SALPINGECTOMY;  Surgeon: Gae Dry, MD;  Location: [facility] ORS;  Service: Gynecology;  Laterality: Left;  . UPPER GI ENDOSCOPY  2010   with biopsy of gastric erosion  . VAGINAL HYSTERECTOMY N/A 04/09/2016   Procedure: HYSTERECTOMY VAGINAL;  Surgeon: Gae Dry, MD;  Location: [facility] ORS;  Service: Gynecology;  Laterality: N/A;  . VAGINAL HYSTERECTOMY  2017   and bladder tack    Medical History: Past Medical History:  Diagnosis Date  . Arthritis    osteo  . Asthma    needs rescue inhaler few times a year  . Depression   . Dizziness    light headed spells but it has been a while  . Dysrhythmia    tachycardia.(cardizem). brady during procedures  . GERD  (gastroesophageal reflux disease)    gastritis  . Headache(784.0)   . Heart murmur 2019   aortic. dr. Nehemiah Massed not worried about this  . Hyperlipidemia   . Hypertension   . Hypothyroidism   . Migraine   . Orthopnea   . Pneumonia    long time ago (greater than 5 years ago)  . PONV (postoperative nausea and vomiting)    very anxious during cataract surgery. brady during colonoscopy  . Shortness of breath    any exertion, cannot lie flat    Family History: Family History  Problem Relation Age of Onset  . Pulmonary fibrosis Mother   . Melanoma Father   . Cardiomyopathy Sister        and arrhythmia  . Breast cancer Cousin        paternal 1st    Social History   Socioeconomic History  . Marital status: Married    Spouse name: Not on file  . Number of children: Not on file  . Years of education: Not on file  . Highest education level: Not on file  Occupational History  . Not on file  Tobacco Use  . Smoking status: Former Smoker    Types: Cigarettes    Quit date: 1967    Years since quitting: 54.1  . Smokeless tobacco: Never Used  Substance and Sexual Activity  . Alcohol use: Not Currently  . Drug use: No  . Sexual activity: Not Currently  Other Topics Concern  . Not on file  Social History Narrative  . Not on file   Social Determinants of Health   Financial Resource Strain:   . Difficulty of Paying Living Expenses: Not on file  Food Insecurity:   . Worried About Charity fundraiser in the Last Year: Not on file  . Ran Out of Food in the Last Year: Not on file  Transportation Needs:   . Lack of Transportation (Medical): Not on file  . Lack of Transportation (Non-Medical): Not on file  Physical Activity:   . Days of Exercise per Week: Not on file  . Minutes of Exercise per Session: Not on file  Stress:   . Feeling of Stress : Not on file  Social Connections:   . Frequency of Communication with Friends and Family: Not on file  . Frequency of Social  Gatherings with Friends and Family: Not on file  . Attends Religious Services: Not on file  . Active Member of Clubs or Organizations: Not on file  . Attends Archivist Meetings: Not on file  . Marital Status: Not on file  Intimate Partner Violence:   . Fear of Current or Ex-Partner: Not on file  . Emotionally Abused: Not on file  . Physically Abused: Not on file  . Sexually Abused: Not on file      Review of Systems  Constitutional: Positive for fatigue. Negative for chills and unexpected weight change.       Fatigue is unchanged since her last visit with me.   HENT: Negative for congestion, rhinorrhea, sinus pressure, sneezing and sore throat.   Respiratory: Negative for cough, chest tightness, shortness of breath and wheezing.   Cardiovascular: Negative for chest pain and palpitations.  Gastrointestinal: Negative for abdominal pain, constipation, diarrhea, nausea and vomiting.  Endocrine: Negative for cold intolerance, heat intolerance, polydipsia and polyuria.  Musculoskeletal: Positive for arthralgias and myalgias. Negative for back pain, joint swelling and neck pain.       Generalized joint pain  Skin: Negative for rash.  Allergic/Immunologic: Negative for environmental allergies.  Neurological: Positive for dizziness, weakness and headaches. Negative for tremors and numbness.  Hematological: Negative for adenopathy. Does not bruise/bleed easily.  Psychiatric/Behavioral: Positive for dysphoric mood. Negative for behavioral problems and sleep disturbance. The patient is not nervous/anxious.     Today's Vitals   09/21/19 1611  BP: (!) 151/86  Pulse: (!) 109  Resp: 16  Temp: (!) 97.2 F (36.2 C)  SpO2: 97%  Weight: 148 lb (67.1 kg)  Height: 5' 0.5" (1.537 m)   Body mass index is 28.43 kg/m.  Physical Exam Vitals and nursing note reviewed.  Constitutional:      General: She is not in acute distress.    Appearance: Normal appearance. She is well-developed.  She is not diaphoretic.  HENT:     Head: Normocephalic and atraumatic.     Mouth/Throat:     Pharynx: No oropharyngeal exudate.  Eyes:     Pupils: Pupils are equal, round, and reactive to light.  Neck:     Thyroid: No thyromegaly.     Vascular: No JVD.     Trachea: No tracheal deviation.  Cardiovascular:     Rate and Rhythm: Normal rate and regular rhythm.     Heart sounds: Normal heart sounds. No murmur. No friction rub. No gallop.   Pulmonary:     Effort: Pulmonary effort is normal. No respiratory distress.     Breath sounds: Normal breath sounds. No wheezing or rales.  Chest:     Chest wall: No tenderness.  Abdominal:     General: Bowel sounds are normal.     Palpations: Abdomen is soft.     Tenderness: There is no abdominal tenderness.  Musculoskeletal:        General: Normal range of motion.     Cervical back: Normal range of motion and neck supple.     Comments: There is generalized joint pain as well as moderate mid back pain. No point tenderness present. No acute abnormalities or deformities which are appreciated today.   Lymphadenopathy:     Cervical: No cervical adenopathy.  Skin:  General: Skin is warm and dry.  Neurological:     Mental Status: She is alert and oriented to person, place, and time. Mental status is at baseline.     Cranial Nerves: No cranial nerve deficit.  Psychiatric:        Attention and Perception: Attention and perception normal.        Mood and Affect: Mood is depressed.        Speech: Speech normal.        Behavior: Behavior normal.        Thought Content: Thought content normal.        Cognition and Memory: Cognition and memory normal.        Judgment: Judgment normal.    Assessment/Plan:  1. Benign essential HTN Generally stable. Continue bp medication as prescribed   2. Mild intermittent asthma without complication Continue to use inhalers and respiratory medication as prescribed   3. Inflammatory polyarthritis (HCC) Increase  duloxetine to 30mg  daily. Monitor closely.  - DULoxetine (CYMBALTA) 30 MG capsule; Take 1 capsule (30 mg total) by mouth daily.  Dispense: 30 capsule; Refill: 3  4. Excessive daytime sleepiness Unchanged. Continue to monitor  5. Seasonal allergic rhinitis due to pollen Continue allergy medications and treatments as prescribed   General Counseling: Maizee verbalizes understanding of the findings of todays visit and agrees with plan of treatment. I have discussed any further diagnostic evaluation that may be needed or ordered today. We also reviewed her medications today. she has been encouraged to call the office with any questions or concerns that should arise related to todays visit.   This patient was seen by [place] with Dr Lavera Guise as a part of collaborative care agreement  Meds ordered this encounter  Medications  . DULoxetine (CYMBALTA) 30 MG capsule    Sig: Take 1 capsule (30 mg total) by mouth daily.    Dispense:  30 capsule    Refill:  3    Increased dose to 30mg . Please fill as 30mg  rather than the 20mg  capsules. Thanks.    Order Specific Question:   Supervising Provider    Answer:   Lavera Guise [id number]    Total time spent: 30 Minutes  Time spent includes review of chart, medications, test results, and follow up plan with the patient.      Dr Lavera Guise Internal medicine

## 2019-09-26 DIAGNOSIS — R002 Palpitations: Secondary | ICD-10-CM | POA: Diagnosis not present

## 2019-09-26 DIAGNOSIS — R072 Precordial pain: Secondary | ICD-10-CM | POA: Diagnosis not present

## 2019-09-26 DIAGNOSIS — R0789 Other chest pain: Secondary | ICD-10-CM | POA: Diagnosis not present

## 2019-09-26 DIAGNOSIS — I471 Supraventricular tachycardia: Secondary | ICD-10-CM | POA: Diagnosis not present

## 2019-10-05 ENCOUNTER — Ambulatory Visit: Payer: Medicare Other | Admitting: Internal Medicine

## 2019-10-10 DIAGNOSIS — R079 Chest pain, unspecified: Secondary | ICD-10-CM | POA: Diagnosis not present

## 2019-10-17 ENCOUNTER — Ambulatory Visit: Payer: Medicare Other | Admitting: Internal Medicine

## 2019-10-18 DIAGNOSIS — I700 Atherosclerosis of aorta: Secondary | ICD-10-CM | POA: Diagnosis not present

## 2019-10-18 DIAGNOSIS — I493 Ventricular premature depolarization: Secondary | ICD-10-CM | POA: Diagnosis not present

## 2019-10-18 DIAGNOSIS — I471 Supraventricular tachycardia: Secondary | ICD-10-CM | POA: Diagnosis not present

## 2019-10-18 DIAGNOSIS — I10 Essential (primary) hypertension: Secondary | ICD-10-CM | POA: Diagnosis not present

## 2019-10-20 ENCOUNTER — Telehealth: Payer: Self-pay

## 2019-10-20 NOTE — Telephone Encounter (Signed)
Confirmed appointment on 10/24/2019 and screened for covid. kl[facility] 

## 2019-10-24 ENCOUNTER — Ambulatory Visit (INDEPENDENT_AMBULATORY_CARE_PROVIDER_SITE_OTHER): Payer: Medicare Other | Admitting: Internal Medicine

## 2019-10-24 ENCOUNTER — Other Ambulatory Visit: Payer: Self-pay

## 2019-10-24 ENCOUNTER — Encounter: Payer: Self-pay | Admitting: Adult Health

## 2019-10-24 VITALS — BP 134/64 | HR 98 | Temp 97.1°F | Resp 16 | Ht 60.5 in | Wt 151.6 lb

## 2019-10-24 DIAGNOSIS — I700 Atherosclerosis of aorta: Secondary | ICD-10-CM | POA: Diagnosis not present

## 2019-10-24 DIAGNOSIS — K219 Gastro-esophageal reflux disease without esophagitis: Secondary | ICD-10-CM

## 2019-10-24 DIAGNOSIS — G4734 Idiopathic sleep related nonobstructive alveolar hypoventilation: Secondary | ICD-10-CM | POA: Diagnosis not present

## 2019-10-24 DIAGNOSIS — G4710 Hypersomnia, unspecified: Secondary | ICD-10-CM

## 2019-10-24 DIAGNOSIS — J4520 Mild intermittent asthma, uncomplicated: Secondary | ICD-10-CM

## 2019-10-24 DIAGNOSIS — I10 Essential (primary) hypertension: Secondary | ICD-10-CM

## 2019-10-24 DIAGNOSIS — R0602 Shortness of breath: Secondary | ICD-10-CM

## 2019-10-24 NOTE — Progress Notes (Signed)
[facility] [place], [place] [address]  Pulmonary Sleep Medicine   Office Visit Note  Patient Name: Vanessa Evans DOB: Dec 29, 1945 MRN [id number]  Date of Service: 10/24/2019  Complaints/HPI: Pt is here for pulmonary follow up.  Since our last visit she has had a nuclear stress test and has worn a holter monitor.  Dr. Nehemiah Massed has read them(per patient), and there appears to be no acute abnormalities at this time. She reports she is wearing her oxygen at night 2 lpm she has not noticed any changes in her breathing at this time. She continues to have SOB, with intermittent cough.  She does have a family history of IPF (mom), two aunts also had IPF.     ROS  General: (-) fever, (-) chills, (-) night sweats, (-) weakness Skin: (-) rashes, (-) itching,. Eyes: (-) visual changes, (-) redness, (-) itching. Nose and Sinuses: (-) nasal stuffiness or itchiness, (-) postnasal drip, (-) nosebleeds, (-) sinus trouble. Mouth and Throat: (-) sore throat, (-) hoarseness. Neck: (-) swollen glands, (-) enlarged thyroid, (-) neck pain. Respiratory: - cough, (-) bloody sputum, + shortness of breath, - wheezing. Cardiovascular: - ankle swelling, (-) chest pain. Lymphatic: (-) lymph node enlargement. Neurologic: (-) numbness, (-) tingling. Psychiatric: (-) anxiety, (-) depression   Current Medication: Outpatient Encounter Medications as of 10/24/2019  Medication Sig  . acetaminophen (TYLENOL) 500 MG tablet Take 1,000 mg by mouth 2 (two) times daily as needed for moderate pain.  Marland Kitchen albuterol (PROVENTIL) (2.5 MG/3ML) 0.083% nebulizer solution Take 3 mLs (2.5 mg total) by nebulization every 6 (six) hours as needed for wheezing or shortness of breath.  Marland Kitchen albuterol (VENTOLIN HFA) 108 (90 Base) MCG/ACT inhaler Inhale 2 puffs into the lungs every 4 (four) hours as needed for wheezing or shortness of breath.  Marland Kitchen aspirin EC 81 MG tablet Take 81 mg by mouth at bedtime.   . benzonatate  (TESSALON) 100 MG capsule Take 2 capsules (200 mg total) by mouth 2 (two) times daily as needed for cough.  Marland Kitchen BLACK ELDERBERRY PO Take 1 Dose by mouth 2 (two) times a day.  . Calcium Carbonate-Vitamin D (CALCIUM 600+D) 600-400 MG-UNIT tablet Take 1 tablet by mouth 2 (two) times daily.  . cholecalciferol (VITAMIN D) 1000 UNITS tablet Take 1,000 Units by mouth at bedtime.   . cyanocobalamin 500 MCG tablet Take 500 mcg by mouth daily.  Marland Kitchen Dextran 70-Hypromellose (ARTIFICIAL TEARS) 0.1-0.3 % SOLN Apply 1-2 drops to eye daily.  Marland Kitchen diltiazem (CARDIZEM CD) 120 MG 24 hr capsule Take 120 mg by mouth at bedtime.   Marland Kitchen diltiazem (TIAZAC) 120 MG 24 hr capsule Take 1 capsule by mouth daily.  . diphenhydrAMINE (BENADRYL) 25 MG tablet Take 25-50 mg by mouth daily as needed for itching.  . docusate sodium (COLACE) 100 MG capsule Take 200 mg by mouth at bedtime as needed for moderate constipation.   . DULoxetine (CYMBALTA) 30 MG capsule Take 1 capsule (30 mg total) by mouth daily.  Marland Kitchen ELDERBERRY PO Take 1 tablet by mouth 2 (two) times daily.  Marland Kitchen EPINEPHrine (EPIPEN 2-PAK) 0.3 mg/0.3 mL IJ SOAJ injection use as directed for severe allergy reaction  . estradiol (ESTRACE VAGINAL) 0.1 MG/GM vaginal cream Apply a pea-sized amount to urethra at bedtime  . fenofibrate 160 MG tablet take1 tab po daily for chol  . folic acid (FOLVITE) A999333 MCG tablet Take 400 mcg by mouth daily.  Marland Kitchen guaiFENesin (MUCINEX) 600 MG 12 hr tablet Take  600 mg by mouth at bedtime as needed (congestion).  . Hypromellose (ARTIFICIAL TEARS OP) Place 1-2 drops into both eyes daily as needed (dry eyes).  Marland Kitchen ketotifen (ZADITOR) 0.025 % ophthalmic solution Place 1 drop into both eyes 2 (two) times daily as needed (allergies).  Marland Kitchen levocetirizine (XYZAL) 5 MG tablet Take 1 tablet (5 mg total) by mouth every evening.  Marland Kitchen levothyroxine (SYNTHROID, LEVOTHROID) 50 MCG tablet Take 50 mcg by mouth daily before breakfast. Brand Name only  . Magnesium 250 MG TABS Take 1  tablet by mouth daily.  . Melatonin 5 MG TABS Take 1 tablet by mouth at bedtime. For sleep   . Multiple Vitamins-Minerals (MULTIVITAMIN WITH MINERALS) tablet Take 1 tablet by mouth daily.  . pantoprazole (PROTONIX) 40 MG tablet Take 1 tablet (40 mg total) by mouth 2 (two) times daily.  Marland Kitchen Phenylephrine-guaiFENesin 10-400 MG TABS Take 1 tablet by mouth every 4 (four) hours as needed (congestion).  . predniSONE (STERAPRED UNI-PAK 48 TAB) 10 MG (48) TBPK tablet 12 day taper - take by mouth as directed for 12 days  . pyridoxine (B-6) 100 MG tablet Take 100 mg by mouth daily.  . sodium chloride (OCEAN) 0.65 % SOLN nasal spray Place 1 spray into both nostrils 2 (two) times daily.  Marland Kitchen Specialty Vitamins Products (ECHINACEA C COMPLETE PO) Take by mouth.  . Vitamin A 7.5 MG (25000 UT) CAPS Take 1 tablet by mouth 2 (two) times daily.  Marland Kitchen zinc gluconate 50 MG tablet Take 50 mg by mouth daily.   No facility-administered encounter medications on file as of 10/24/2019.    Surgical History: Past Surgical History:  Procedure Laterality Date  . 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 BIOPSY THYROID  2018  . bladder biopsies  2003   9 biopsies done by dr. cope.  all benign.  inflammatory process going on in bladder  . BREAST BIOPSY Right    benign  . BREAST SURGERY Right    lumpectomy  . CATARACT EXTRACTION W/PHACO Left 12/25/2014   Procedure: CATARACT EXTRACTION PHACO AND INTRAOCULAR LENS PLACEMENT (IOC);  Surgeon: Birder Robson, MD;  Location: [facility] ORS;  Service: Ophthalmology;  Laterality: Left;  [place] 00:32 AP% 20.0 CDE 6.49  . COLONOSCOPY W/ BIOPSIES    . COLONOSCOPY W/ POLYPECTOMY  2002, 2004, 2005   adenomatous polyps removed  . COLONOSCOPY WITH PROPOFOL N/A 08/23/2017   Procedure: COLONOSCOPY WITH PROPOFOL;  Surgeon: Manya Silvas, MD;  Location: [facility] ENDOSCOPY;  Service: Endoscopy;  Laterality: N/A;  . COLONOSCOPY WITH PROPOFOL N/A  11/08/2017   Procedure: COLONOSCOPY WITH PROPOFOL;  Surgeon: Manya Silvas, MD;  Location: [facility] ENDOSCOPY;  Service: Endoscopy;  Laterality: N/A;  . CYSTOCELE REPAIR N/A 04/09/2016   Procedure: ANTERIOR REPAIR (CYSTOCELE);  Surgeon: Gae Dry, MD;  Location: [facility] ORS;  Service: Gynecology;  Laterality: N/A;  . DIAGNOSTIC LAPAROSCOPY  2008   removed both ovaries with cysts, tubes and fibroids  . DILATION AND CURETTAGE OF UTERUS  1973  . ESOPHAGOGASTRODUODENOSCOPY (EGD) WITH PROPOFOL N/A 08/23/2017   Procedure: ESOPHAGOGASTRODUODENOSCOPY (EGD) WITH PROPOFOL;  Surgeon: Manya Silvas, MD;  Location: [facility] ENDOSCOPY;  Service: Endoscopy;  Laterality: N/A;  . EYE SURGERY Bilateral 2016  . HERNIA REPAIR Right 2008   Inguinal hernia Repair, ventral hernia repair  . JOINT REPLACEMENT Right 2008   knee  . KNEE ARTHROSCOPY Right   . LUMBAR LAMINECTOMY/DECOMPRESSION MICRODISCECTOMY  Right 03/21/2013   Procedure: Right Lumbar five-Sacral one Laminectomy for Synovial Cyst;  Surgeon: Faythe Ghee, MD;  Location: [facility]URO ORS;  Service: Neurosurgery;  Laterality: Right;  right  . OOPHORECTOMY    . REVERSE SHOULDER ARTHROPLASTY Right 02/22/2018   Procedure: REVERSE SHOULDER ARTHROPLASTY;  Surgeon: Corky Mull, MD;  Location: [facility] ORS;  Service: Orthopedics;  Laterality: Right;  . TUBAL LIGATION    . UNILATERAL SALPINGECTOMY Left 04/09/2016   Procedure: UNILATERAL SALPINGECTOMY;  Surgeon: Gae Dry, MD;  Location: [facility] ORS;  Service: Gynecology;  Laterality: Left;  . UPPER GI ENDOSCOPY  2010   with biopsy of gastric erosion  . VAGINAL HYSTERECTOMY N/A 04/09/2016   Procedure: HYSTERECTOMY VAGINAL;  Surgeon: Gae Dry, MD;  Location: [facility] ORS;  Service: Gynecology;  Laterality: N/A;  . VAGINAL HYSTERECTOMY  2017   and bladder tack    Medical History: Past Medical History:  Diagnosis Date  . Arthritis    osteo  . Asthma    needs rescue inhaler few times a year  . Depression   .  Dizziness    light headed spells but it has been a while  . Dysrhythmia    tachycardia.(cardizem). brady during procedures  . GERD (gastroesophageal reflux disease)    gastritis  . Headache(784.0)   . Heart murmur 2019   aortic. dr. Nehemiah Massed not worried about this  . Hyperlipidemia   . Hypertension   . Hypothyroidism   . Migraine   . Orthopnea   . Pneumonia    long time ago (greater than 5 years ago)  . PONV (postoperative nausea and vomiting)    very anxious during cataract surgery. brady during colonoscopy  . Shortness of breath    any exertion, cannot lie flat    Family History: Family History  Problem Relation Age of Onset  . Pulmonary fibrosis Mother   . Melanoma Father   . Cardiomyopathy Sister        and arrhythmia  . Breast cancer Cousin        paternal 1st    Social History: Social History   Socioeconomic History  . Marital status: Married    Spouse name: Not on file  . Number of children: Not on file  . Years of education: Not on file  . Highest education level: Not on file  Occupational History  . Not on file  Tobacco Use  . Smoking status: Former Smoker    Types: Cigarettes    Quit date: 1967    Years since quitting: 54.2  . Smokeless tobacco: Never Used  Substance and Sexual Activity  . Alcohol use: Not Currently  . Drug use: No  . Sexual activity: Not Currently  Other Topics Concern  . Not on file  Social History Narrative  . Not on file   Social Determinants of Health   Financial Resource Strain:   . Difficulty of Paying Living Expenses:   Food Insecurity:   . Worried About Charity fundraiser in the Last Year:   . Arboriculturist in the Last Year:   Transportation Needs:   . Film/video editor (Medical):   Marland Kitchen Lack of Transportation (Non-Medical):   Physical Activity:   . Days of Exercise per Week:   . Minutes of Exercise per Session:   Stress:   . Feeling of Stress :   Social Connections:   . Frequency of Communication with  Friends and Family:   . Frequency of  Social Gatherings with Friends and Family:   . Attends Religious Services:   . Active Member of Clubs or Organizations:   . Attends Archivist Meetings:   Marland Kitchen Marital Status:   Intimate Partner Violence:   . Fear of Current or Ex-Partner:   . Emotionally Abused:   Marland Kitchen Physically Abused:   . Sexually Abused:     Vital Signs: Blood pressure 134/64, pulse 98, temperature (!) 97.1 F (36.2 C), resp. rate 16, height 5' 0.5" (1.537 m), weight 151 lb 9.6 oz (68.8 kg), SpO2 95 %.  Examination: General Appearance: The patient is well-developed, well-nourished, and in no distress. Skin: Gross inspection of skin unremarkable. Head: normocephalic, no gross deformities. Eyes: no gross deformities noted. ENT: ears appear grossly normal no exudates. Neck: Supple. No thyromegaly. No LAD. Respiratory: clear bilaterally. Cardiovascular: Normal S1 and S2 without murmur or rub. Extremities: No cyanosis. pulses are equal. Neurologic: Alert and oriented. No involuntary movements.  LABS: No results found for this or any previous visit (from the past 2160 hour(s)).  Radiology: MR LUMBAR SPINE WO CONTRAST  Result Date: 09/30/2018 CLINICAL DATA:  Chronic and worsening low back and coccygeal pain. History lumbar surgery 03/21/2013. EXAM: MRI LUMBAR SPINE WITHOUT CONTRAST TECHNIQUE: Multiplanar, multisequence MR imaging of the lumbar spine was performed. No intravenous contrast was administered. COMPARISON:  CT abdomen and pelvis 12/15/2017. FINDINGS: Segmentation:  Standard. Alignment: 0.8 cm anterolisthesis L5 on S1 is due to a unilateral pars interarticularis defect on the right and facet arthropathy on left and unchanged. Alignment is otherwise maintained. Vertebrae: No fracture or worrisome lesion. Degenerative endplate signal change is most notable at L3-4. Conus medullaris and cauda equina: Conus extends to the L1 level. Conus and cauda equina appear normal.  Paraspinal and other soft tissues: Negative. Disc levels: T10-11 and T11-12 are imaged in the sagittal plane only and negative. T12-L1: Negative. L1-2: Negative. L2-3: Mild facet degenerative change and a minimal bulge. No stenosis. L3-4: There is loss of disc space height with a shallow broad-based disc bulge, ligamentum flavum thickening and mild facet arthropathy. Mild central canal narrowing is seen. The foramina are open. L4-5: Shallow disc bulge, ligamentum flavum thickening and mild to moderate facet arthropathy. There is mild central canal stenosis and mild narrowing in the right subarticular recess. The foramina are open. L5-S1: Right laminotomy defect is identified. The disc is uncovered without bulging. The central canal and foramina are open. IMPRESSION: Mild central canal stenosis at L3-4 due to ligamentum flavum thickening and a shallow disc bulge. Mild central canal and right subarticular recess narrowing at L4-5 due to a shallow disc bulge and ligamentum flavum thickening. Status post right laminotomy at L5-S1. The central canal and foramina are open. 0.8 cm anterolisthesis L5 on S1 due to facet arthropathy on the left and a unilateral pars interarticularis defect on the right is unchanged. Electronically Signed   By: Inge Rise M.D.   On: 09/30/2018 11:20    No results found.  No results found.    Assessment and Plan: Patient Active Problem List   Diagnosis Date Noted  . Inflammatory polyarthritis ([place]) 07/26/2019  . Excessive daytime sleepiness 07/26/2019  . Encounter for screening mammogram for malignant neoplasm of breast 07/26/2019  . Intercostal muscle pain 06/25/2019  . Positive ANA (antinuclear antibody) 05/01/2019  . Primary osteoarthritis involving multiple joints 04/26/2019  . Mild intermittent asthma without complication 0000000  . Seasonal allergic rhinitis due to pollen 08/22/2018  . Chills with fever 08/11/2018  .  Sore throat 08/11/2018  . Acute upper  respiratory infection 08/11/2018  . Candidiasis 08/11/2018  . Iron deficiency anemia 05/05/2018  . Right hip pain 04/27/2018  . Accidental fall from furniture 04/27/2018  . Vitamin D deficiency 04/27/2018  . Cough 04/18/2018  . Dysuria 04/18/2018  . Status post reverse total shoulder replacement, right 02/22/2018  . Abnormal ECG 02/18/2018  . Pain in joint of right shoulder 02/13/2018  . Abdominal aortic atherosclerosis ([place]) 02/13/2018  . Episode of recurrent major depressive disorder ([place]) 02/13/2018  . Encounter for general adult medical examination with abnormal findings 02/13/2018  . Lipoma of right shoulder 01/31/2018  . Rotator cuff tendinitis, right 01/31/2018  . Chronic superficial gastritis without bleeding 12/12/2017  . Schatzki's ring 12/12/2017  . Acute gastritis 11/08/2017  . Nausea 10/18/2017  . Other fatigue 10/18/2017  . Allergic rhinitis 09/20/2017  . Eczema 09/20/2017  . Hematuria 09/20/2017  . Mixed hyperlipidemia 09/20/2017  . Osteoarthritis 09/20/2017  . Osteoporosis, post-menopausal 09/20/2017  . Palpitations 07/05/2017  . Epigastric pain 05/24/2017  . Gastroesophageal reflux disease without esophagitis 05/24/2017  . Impingement syndrome of left shoulder 10/02/2016  . Trochanteric bursitis of left hip 10/02/2016  . SOB (shortness of breath) 04/16/2016  . Uterine prolapse 04/09/2016  . Cystocele 04/09/2016  . Hyponatremia 04/09/2016  . Benign essential HTN 11/18/2015  . Paroxysmal supraventricular tachycardia ([place]) 11/18/2015  . Premature ventricular contraction 07/20/2014  . History of colonic polyps 05/29/2014  . Asthma 02/05/2014  . Chest pain 02/05/2014  . Increased frequency of urination 02/05/2014  . Tachycardia 02/05/2014  . Female stress incontinence 12/14/2013  . Gross hematuria 12/14/2013  . Incomplete emptying of bladder 12/14/2013  . Other chronic cystitis without hematuria 12/14/2013    1. Mild intermittent asthma without  complication Continue ventolin use, well controled currently.  2. Nocturnal hypoxia Continue to use oxygen 2 LPM at night.    3. Benign essential HTN BP stable, continue current medications.   4. Gastroesophageal reflux disease without esophagitis Stable, continue current therapy.   5. Hypersomnia, unspecified Continue to use oxygen at night, and follow up as scheduled.   6. Shortness of breath Pt has extensive family history of IPF.  Will get scan for evaluation at this time.  - CT Chest High Resolution; Future  7. Aortic atherosclerosis ([place]) ON CTA 06/07/2017, patient is on finofibrate.  Continue to control BP.   General Counseling: I have discussed the findings of the evaluation and examination with Joniya.  I have also discussed any further diagnostic evaluation thatmay be needed or ordered today. Mariadelcarmen verbalizes understanding of the findings of todays visit. We also reviewed her medications today and discussed drug interactions and side effects including but not limited excessive drowsiness and altered mental states. We also discussed that there is always a risk not just to her but also people around her. she has been encouraged to call the office with any questions or concerns that should arise related to todays visit.  No orders of the defined types were placed in this encounter.    Time spent: 25 This patient was seen by Orson Gear AGNP-C in Collaboration with Dr. Devona Konig as a part of collaborative care agreement.   I have personally obtained a history, examined the patient, evaluated laboratory and imaging results, formulated the assessment and plan and placed orders.    Allyne Gee, MD [facility] Pulmonary and Critical Care Sleep medicine

## 2019-10-29 ENCOUNTER — Other Ambulatory Visit: Payer: Self-pay | Admitting: Adult Health

## 2019-10-30 ENCOUNTER — Ambulatory Visit: Payer: Medicare Other | Admitting: Internal Medicine

## 2019-10-30 DIAGNOSIS — R768 Other specified abnormal immunological findings in serum: Secondary | ICD-10-CM | POA: Diagnosis not present

## 2019-10-30 DIAGNOSIS — M7062 Trochanteric bursitis, left hip: Secondary | ICD-10-CM | POA: Diagnosis not present

## 2019-10-30 DIAGNOSIS — M8949 Other hypertrophic osteoarthropathy, multiple sites: Secondary | ICD-10-CM | POA: Diagnosis not present

## 2019-10-30 DIAGNOSIS — H43823 Vitreomacular adhesion, bilateral: Secondary | ICD-10-CM | POA: Diagnosis not present

## 2019-11-07 ENCOUNTER — Other Ambulatory Visit: Payer: Self-pay

## 2019-11-07 ENCOUNTER — Ambulatory Visit
Admission: RE | Admit: 2019-11-07 | Discharge: 2019-11-07 | Disposition: A | Discharge status: Home / Self Care | Payer: Medicare Other | Source: Ambulatory Visit | Attending: Adult Health | Admitting: Adult Health

## 2019-11-07 DIAGNOSIS — R0602 Shortness of breath: Secondary | ICD-10-CM | POA: Diagnosis not present

## 2019-11-09 ENCOUNTER — Telehealth: Payer: Self-pay

## 2019-11-09 NOTE — Telephone Encounter (Signed)
Confirmed appointment on 11/14/2019 and screened for covid. [facility] 

## 2019-11-14 ENCOUNTER — Ambulatory Visit (INDEPENDENT_AMBULATORY_CARE_PROVIDER_SITE_OTHER): Payer: Medicare Other | Admitting: Internal Medicine

## 2019-11-14 ENCOUNTER — Encounter: Payer: Self-pay | Admitting: Internal Medicine

## 2019-11-14 ENCOUNTER — Other Ambulatory Visit: Payer: Self-pay

## 2019-11-14 ENCOUNTER — Ambulatory Visit: Payer: Self-pay | Admitting: Adult Health

## 2019-11-14 VITALS — BP 149/96 | HR 94 | Temp 97.3°F | Resp 16 | Ht 60.05 in | Wt 151.0 lb

## 2019-11-14 DIAGNOSIS — R0609 Other forms of dyspnea: Secondary | ICD-10-CM

## 2019-11-14 DIAGNOSIS — J301 Allergic rhinitis due to pollen: Secondary | ICD-10-CM | POA: Diagnosis not present

## 2019-11-14 DIAGNOSIS — R0600 Dyspnea, unspecified: Secondary | ICD-10-CM

## 2019-11-14 DIAGNOSIS — J4520 Mild intermittent asthma, uncomplicated: Secondary | ICD-10-CM

## 2019-11-14 MED ORDER — LEVOCETIRIZINE DIHYDROCHLORIDE 5 MG PO TABS: 5.0000 mg | ORAL_TABLET | Freq: Every evening | ORAL | 5 refills | Status: DC

## 2019-11-14 MED ORDER — FLUTICASONE PROPIONATE 50 MCG/ACT NA SUSP: NASAL | 0 refills | Status: DC

## 2019-11-14 NOTE — Progress Notes (Signed)
[facility] [address], [place] [address]  Pulmonary Sleep Medicine   Office Visit Note  Patient Name: Vanessa Evans DOB: [birth year] MRN [id number]  Date of Service: 11/14/2019  Complaints/HPI: Pt is here for pulmonary follow up.  She overall is doing well. She continues to do well with oxygen.  She does report ongoing sob at times it can be with minimal exertion.  She continues to report fatigue. Her sleep study was negative for OSA.  She sees Dr. Nehemiah Massed who has told her it does not appear to be an issue with her heart. His eval included a Holter monitor and nuclear stress test.    ROS  General: (-) fever, (-) chills, (-) night sweats, (-) weakness Skin: (-) rashes, (-) itching,. Eyes: (-) visual changes, (-) redness, (-) itching. Nose and Sinuses: (-) nasal stuffiness or itchiness, (-) postnasal drip, (-) nosebleeds, (-) sinus trouble. Mouth and Throat: (-) sore throat, (-) hoarseness. Neck: (-) swollen glands, (-) enlarged thyroid, (-) neck pain. Respiratory: - cough, (-) bloody sputum, - shortness of breath, - wheezing. Cardiovascular: - ankle swelling, (-) chest pain. Lymphatic: (-) lymph node enlargement. Neurologic: (-) numbness, (-) tingling. Psychiatric: (-) anxiety, (-) depression   Current Medication: Outpatient Encounter Medications as of 11/14/2019  Medication Sig  . acetaminophen (TYLENOL) 500 MG tablet Take 1,000 mg by mouth 2 (two) times daily as needed for moderate pain.  Marland Kitchen albuterol (PROVENTIL) (2.5 MG/3ML) 0.083% nebulizer solution Take 3 mLs (2.5 mg total) by nebulization every 6 (six) hours as needed for wheezing or shortness of breath.  Marland Kitchen albuterol (VENTOLIN HFA) 108 (90 Base) MCG/ACT inhaler Inhale 2 puffs into the lungs every 4 (four) hours as needed for wheezing or shortness of breath.  Marland Kitchen aspirin EC 81 MG tablet Take 81 mg by mouth at bedtime.   . benzonatate (TESSALON) 100 MG capsule Take 2 capsules (200 mg total) by mouth 2  (two) times daily as needed for cough.  Marland Kitchen BLACK ELDERBERRY PO Take 1 Dose by mouth 2 (two) times a day.  . Calcium Carbonate-Vitamin D (CALCIUM 600+D) 600-400 MG-UNIT tablet Take 1 tablet by mouth 2 (two) times daily.  . cholecalciferol (VITAMIN D) 1000 UNITS tablet Take 1,000 Units by mouth at bedtime.   . cyanocobalamin 500 MCG tablet Take 500 mcg by mouth daily.  Marland Kitchen Dextran 70-Hypromellose (ARTIFICIAL TEARS) 0.1-0.3 % SOLN Apply 1-2 drops to eye daily.  Marland Kitchen diltiazem (CARDIZEM CD) 120 MG 24 hr capsule Take 120 mg by mouth at bedtime.   Marland Kitchen diltiazem (TIAZAC) 120 MG 24 hr capsule Take 1 capsule by mouth daily.  . diphenhydrAMINE (BENADRYL) 25 MG tablet Take 25-50 mg by mouth daily as needed for itching.  . docusate sodium (COLACE) 100 MG capsule Take 200 mg by mouth at bedtime as needed for moderate constipation.   . DULoxetine (CYMBALTA) 30 MG capsule Take 1 capsule (30 mg total) by mouth daily.  Marland Kitchen ELDERBERRY PO Take 1 tablet by mouth 2 (two) times daily.  Marland Kitchen EPINEPHrine (EPIPEN 2-PAK) 0.3 mg/0.3 mL IJ SOAJ injection use as directed for severe allergy reaction  . estradiol (ESTRACE VAGINAL) 0.1 MG/GM vaginal cream Apply a pea-sized amount to urethra at bedtime  . fenofibrate 160 MG tablet take1 tab po daily for chol  . fluticasone (FLONASE) 50 MCG/ACT nasal spray USE 2 SPRAYS EACH NOSTRIL TWICE A DAY  . folic acid (FOLVITE) A999333 MCG tablet Take 400 mcg by mouth daily.  Marland Kitchen guaiFENesin (MUCINEX) 600 MG 12  hr tablet Take 600 mg by mouth at bedtime as needed (congestion).  . Hypromellose (ARTIFICIAL TEARS OP) Place 1-2 drops into both eyes daily as needed (dry eyes).  Marland Kitchen ketotifen (ZADITOR) 0.025 % ophthalmic solution Place 1 drop into both eyes 2 (two) times daily as needed (allergies).  Marland Kitchen levocetirizine (XYZAL) 5 MG tablet Take 1 tablet (5 mg total) by mouth every evening.  Marland Kitchen levothyroxine (SYNTHROID, LEVOTHROID) 50 MCG tablet Take 50 mcg by mouth daily before breakfast. Brand Name only  . Magnesium  250 MG TABS Take 1 tablet by mouth daily.  . Melatonin 5 MG TABS Take 1 tablet by mouth at bedtime. For sleep   . Multiple Vitamins-Minerals (MULTIVITAMIN WITH MINERALS) tablet Take 1 tablet by mouth daily.  . pantoprazole (PROTONIX) 40 MG tablet Take 1 tablet (40 mg total) by mouth 2 (two) times daily.  Marland Kitchen Phenylephrine-guaiFENesin 10-400 MG TABS Take 1 tablet by mouth every 4 (four) hours as needed (congestion).  . predniSONE (STERAPRED UNI-PAK 48 TAB) 10 MG (48) TBPK tablet 12 day taper - take by mouth as directed for 12 days  . pyridoxine (B-6) 100 MG tablet Take 100 mg by mouth daily.  . sodium chloride (OCEAN) 0.65 % SOLN nasal spray Place 1 spray into both nostrils 2 (two) times daily.  Marland Kitchen Specialty Vitamins Products (ECHINACEA C COMPLETE PO) Take by mouth.  . Vitamin A 7.5 MG (25000 UT) CAPS Take 1 tablet by mouth 2 (two) times daily.  Marland Kitchen zinc gluconate 50 MG tablet Take 50 mg by mouth daily.   No facility-administered encounter medications on file as of 11/14/2019.    Surgical History: Past Surgical History:  Procedure Laterality Date  . 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 BIOPSY THYROID  2018  . bladder biopsies  2003   9 biopsies done by dr. cope.  all benign.  inflammatory process going on in bladder  . BREAST BIOPSY Right    benign  . BREAST SURGERY Right    lumpectomy  . CATARACT EXTRACTION W/PHACO Left 12/25/2014   Procedure: CATARACT EXTRACTION PHACO AND INTRAOCULAR LENS PLACEMENT (IOC);  Surgeon: Birder Robson, MD;  Location: [facility] ORS;  Service: Ophthalmology;  Laterality: Left;  [place] 00:32 AP% 20.0 CDE 6.49  . COLONOSCOPY W/ BIOPSIES    . COLONOSCOPY W/ POLYPECTOMY  2002, 2004, 2005   adenomatous polyps removed  . COLONOSCOPY WITH PROPOFOL N/A 08/23/2017   Procedure: COLONOSCOPY WITH PROPOFOL;  Surgeon: Manya Silvas, MD;  Location: [facility] ENDOSCOPY;  Service: Endoscopy;  Laterality: N/A;  . COLONOSCOPY WITH  PROPOFOL N/A 11/08/2017   Procedure: COLONOSCOPY WITH PROPOFOL;  Surgeon: Manya Silvas, MD;  Location: [facility] ENDOSCOPY;  Service: Endoscopy;  Laterality: N/A;  . CYSTOCELE REPAIR N/A 04/09/2016   Procedure: ANTERIOR REPAIR (CYSTOCELE);  Surgeon: Gae Dry, MD;  Location: [facility] ORS;  Service: Gynecology;  Laterality: N/A;  . DIAGNOSTIC LAPAROSCOPY  2008   removed both ovaries with cysts, tubes and fibroids  . DILATION AND CURETTAGE OF UTERUS  1973  . ESOPHAGOGASTRODUODENOSCOPY (EGD) WITH PROPOFOL N/A 08/23/2017   Procedure: ESOPHAGOGASTRODUODENOSCOPY (EGD) WITH PROPOFOL;  Surgeon: Manya Silvas, MD;  Location: [facility] ENDOSCOPY;  Service: Endoscopy;  Laterality: N/A;  . EYE SURGERY Bilateral 2016  . HERNIA REPAIR Right 2008   Inguinal hernia Repair, ventral hernia repair  . JOINT REPLACEMENT Right 2008   knee  . KNEE ARTHROSCOPY Right   .  LUMBAR LAMINECTOMY/DECOMPRESSION MICRODISCECTOMY Right 03/21/2013   Procedure: Right Lumbar five-Sacral one Laminectomy for Synovial Cyst;  Surgeon: Faythe Ghee, MD;  Location: [facility] NEURO ORS;  Service: Neurosurgery;  Laterality: Right;  right  . OOPHORECTOMY    . REVERSE SHOULDER ARTHROPLASTY Right 02/22/2018   Procedure: REVERSE SHOULDER ARTHROPLASTY;  Surgeon: Corky Mull, MD;  Location: [facility] ORS;  Service: Orthopedics;  Laterality: Right;  . TUBAL LIGATION    . UNILATERAL SALPINGECTOMY Left 04/09/2016   Procedure: UNILATERAL SALPINGECTOMY;  Surgeon: Gae Dry, MD;  Location: [facility] ORS;  Service: Gynecology;  Laterality: Left;  . UPPER GI ENDOSCOPY  2010   with biopsy of gastric erosion  . VAGINAL HYSTERECTOMY N/A 04/09/2016   Procedure: HYSTERECTOMY VAGINAL;  Surgeon: Gae Dry, MD;  Location: [facility] ORS;  Service: Gynecology;  Laterality: N/A;  . VAGINAL HYSTERECTOMY  2017   and bladder tack    Medical History: Past Medical History:  Diagnosis Date  . Arthritis    osteo  . Asthma    needs rescue inhaler few times a year  .  Depression   . Dizziness    light headed spells but it has been a while  . Dysrhythmia    tachycardia.(cardizem). brady during procedures  . GERD (gastroesophageal reflux disease)    gastritis  . Headache(784.0)   . Heart murmur 2019   aortic. dr. Nehemiah Massed not worried about this  . Hyperlipidemia   . Hypertension   . Hypothyroidism   . Migraine   . Orthopnea   . Pneumonia    long time ago (greater than 5 years ago)  . PONV (postoperative nausea and vomiting)    very anxious during cataract surgery. brady during colonoscopy  . Shortness of breath    any exertion, cannot lie flat    Family History: Family History  Problem Relation Age of Onset  . Pulmonary fibrosis Mother   . Melanoma Father   . Cardiomyopathy Sister        and arrhythmia  . Breast cancer Cousin        paternal 1st    Social History: Social History   Socioeconomic History  . Marital status: Married    Spouse name: Not on file  . Number of children: Not on file  . Years of education: Not on file  . Highest education level: Not on file  Occupational History  . Not on file  Tobacco Use  . Smoking status: Former Smoker    Types: Cigarettes    Quit date: 1967    Years since quitting: 54.2  . Smokeless tobacco: Never Used  Substance and Sexual Activity  . Alcohol use: Not Currently  . Drug use: No  . Sexual activity: Not Currently  Other Topics Concern  . Not on file  Social History Narrative  . Not on file   Social Determinants of Health   Financial Resource Strain:   . Difficulty of Paying Living Expenses:   Food Insecurity:   . Worried About Charity fundraiser in the Last Year:   . Arboriculturist in the Last Year:   Transportation Needs:   . Film/video editor (Medical):   Marland Kitchen Lack of Transportation (Non-Medical):   Physical Activity:   . Days of Exercise per Week:   . Minutes of Exercise per Session:   Stress:   . Feeling of Stress :   Social Connections:   . Frequency of  Communication with Friends and Family:   .  Frequency of Social Gatherings with Friends and Family:   . Attends Religious Services:   . Active Member of Clubs or Organizations:   . Attends Archivist Meetings:   Marland Kitchen Marital Status:   Intimate Partner Violence:   . Fear of Current or Ex-Partner:   . Emotionally Abused:   Marland Kitchen Physically Abused:   . Sexually Abused:     Vital Signs: Blood pressure (!) 149/96, pulse 94, temperature (!) 97.3 F (36.3 C), resp. rate 16, height 5' 0.05" (1.525 m), weight 151 lb (68.5 kg), SpO2 97 %.  Examination: General Appearance: The patient is well-developed, well-nourished, and in no distress. Skin: Gross inspection of skin unremarkable. Head: normocephalic, no gross deformities. Eyes: no gross deformities noted. ENT: ears appear grossly normal no exudates. Neck: Supple. No thyromegaly. No LAD. Respiratory: clear bilaterally. Cardiovascular: Normal S1 and S2 without murmur or rub. Extremities: No cyanosis. pulses are equal. Neurologic: Alert and oriented. No involuntary movements.  LABS: No results found for this or any previous visit (from the past 2160 hour(s)).  Radiology: CT Chest High Resolution  Result Date: 11/07/2019 CLINICAL DATA:  Shortness of breath, family history of IPF, history of smoking EXAM: CT CHEST WITHOUT CONTRAST TECHNIQUE: Multidetector CT imaging of the chest was performed following the standard protocol without intravenous contrast. High resolution imaging of the lungs, as well as inspiratory and expiratory imaging, was performed. COMPARISON:  CT chest pulmonary angiogram, 06/07/2017, 04/15/2016, CT chest, 08/08/2010 FINDINGS: Cardiovascular: Aortic atherosclerosis. Normal heart size. No pericardial effusion. Mediastinum/Nodes: No enlarged mediastinal, hilar, or axillary lymph nodes. Thyroid gland, trachea, and esophagus demonstrate no significant findings. Lungs/Pleura: Mild biapical pleuroparenchymal scarring. Numerous  tiny centrilobular pulmonary nodules, predominantly in the upper lobes. Occasional more discrete small pulmonary nodules, stable and benign, for example a 3 mm nodule of the right upper lobe (series 10, image 63). Minimal, bland appearing scarring of the bilateral lung bases. Mild lobular air trapping on expiratory phase imaging. No pleural effusion or pneumothorax. Upper Abdomen: No acute abnormality. Musculoskeletal: No chest wall mass or suspicious bone lesions identified. IMPRESSION: 1. Minimal, bland appearing scarring the bilateral lung bases. No findings specific for interstitial lung disease. 2. Mild lobular air trapping on expiratory phase imaging, consistent with small airways disease. 3. Numerous tiny centrilobular pulmonary nodules, predominantly in the upper lobes, likely smoking-related respiratory bronchiolitis. 4.  Aortic Atherosclerosis (ICD10-I70.0). Electronically Signed   By: Eddie Candle M.D.   On: 11/07/2019 13:51    No results found.  CT Chest High Resolution  Result Date: 11/07/2019 CLINICAL DATA:  Shortness of breath, family history of IPF, history of smoking EXAM: CT CHEST WITHOUT CONTRAST TECHNIQUE: Multidetector CT imaging of the chest was performed following the standard protocol without intravenous contrast. High resolution imaging of the lungs, as well as inspiratory and expiratory imaging, was performed. COMPARISON:  CT chest pulmonary angiogram, 06/07/2017, 04/15/2016, CT chest, 08/08/2010 FINDINGS: Cardiovascular: Aortic atherosclerosis. Normal heart size. No pericardial effusion. Mediastinum/Nodes: No enlarged mediastinal, hilar, or axillary lymph nodes. Thyroid gland, trachea, and esophagus demonstrate no significant findings. Lungs/Pleura: Mild biapical pleuroparenchymal scarring. Numerous tiny centrilobular pulmonary nodules, predominantly in the upper lobes. Occasional more discrete small pulmonary nodules, stable and benign, for example a 3 mm nodule of the right upper  lobe (series 10, image 63). Minimal, bland appearing scarring of the bilateral lung bases. Mild lobular air trapping on expiratory phase imaging. No pleural effusion or pneumothorax. Upper Abdomen: No acute abnormality. Musculoskeletal: No chest wall mass or suspicious bone lesions  identified. IMPRESSION: 1. Minimal, bland appearing scarring the bilateral lung bases. No findings specific for interstitial lung disease. 2. Mild lobular air trapping on expiratory phase imaging, consistent with small airways disease. 3. Numerous tiny centrilobular pulmonary nodules, predominantly in the upper lobes, likely smoking-related respiratory bronchiolitis. 4.  Aortic Atherosclerosis (ICD10-I70.0). Electronically Signed   By: Eddie Candle M.D.   On: 11/07/2019 13:51      Assessment and Plan: Patient Active Problem List   Diagnosis Date Noted  . Inflammatory polyarthritis ([place]) 07/26/2019  . Excessive daytime sleepiness 07/26/2019  . Encounter for screening mammogram for malignant neoplasm of breast 07/26/2019  . Intercostal muscle pain 06/25/2019  . Positive ANA (antinuclear antibody) 05/01/2019  . Primary osteoarthritis involving multiple joints 04/26/2019  . Mild intermittent asthma without complication 0000000  . Seasonal allergic rhinitis due to pollen 08/22/2018  . Chills with fever 08/11/2018  . Sore throat 08/11/2018  . Acute upper respiratory infection 08/11/2018  . Candidiasis 08/11/2018  . Iron deficiency anemia 05/05/2018  . Right hip pain 04/27/2018  . Accidental fall from furniture 04/27/2018  . Vitamin D deficiency 04/27/2018  . Cough 04/18/2018  . Dysuria 04/18/2018  . Status post reverse total shoulder replacement, right 02/22/2018  . Abnormal ECG 02/18/2018  . Pain in joint of right shoulder 02/13/2018  . Abdominal aortic atherosclerosis ([place]) 02/13/2018  . Episode of recurrent major depressive disorder ([place]) 02/13/2018  . Encounter for general adult medical examination with  abnormal findings 02/13/2018  . Lipoma of right shoulder 01/31/2018  . Rotator cuff tendinitis, right 01/31/2018  . Chronic superficial gastritis without bleeding 12/12/2017  . Schatzki's ring 12/12/2017  . Acute gastritis 11/08/2017  . Nausea 10/18/2017  . Other fatigue 10/18/2017  . Allergic rhinitis 09/20/2017  . Eczema 09/20/2017  . Hematuria 09/20/2017  . Mixed hyperlipidemia 09/20/2017  . Osteoarthritis 09/20/2017  . Osteoporosis, post-menopausal 09/20/2017  . Palpitations 07/05/2017  . Epigastric pain 05/24/2017  . Gastroesophageal reflux disease without esophagitis 05/24/2017  . Impingement syndrome of left shoulder 10/02/2016  . Trochanteric bursitis of left hip 10/02/2016  . SOB (shortness of breath) 04/16/2016  . Uterine prolapse 04/09/2016  . Cystocele 04/09/2016  . Hyponatremia 04/09/2016  . Benign essential HTN 11/18/2015  . Paroxysmal supraventricular tachycardia ([place]) 11/18/2015  . Premature ventricular contraction 07/20/2014  . History of colonic polyps 05/29/2014  . Asthma 02/05/2014  . Chest pain 02/05/2014  . Increased frequency of urination 02/05/2014  . Tachycardia 02/05/2014  . Female stress incontinence 12/14/2013  . Gross hematuria 12/14/2013  . Incomplete emptying of bladder 12/14/2013  . Other chronic cystitis without hematuria 12/14/2013    1. Mild intermittent asthma without complication Use albuterol and start anoro trial as discussed.  Call office if good results and will send RX.   2. DOE (dyspnea on exertion) Use Anoro samples as instructed.   3. Allergic rhinitis due to pollen, unspecified seasonality Continue to use flonase and xyzal as directed.  - fluticasone (FLONASE) 50 MCG/ACT nasal spray; USE 2 SPRAYS EACH NOSTRIL TWICE A DAY  Dispense: 16 g; Refill: 0 - levocetirizine (XYZAL) 5 MG tablet; Take 1 tablet (5 mg total) by mouth every evening.  Dispense: 30 tablet; Refill: 5  General Counseling: I have discussed the findings of the  evaluation and examination with Kumari.  I have also discussed any further diagnostic evaluation thatmay be needed or ordered today. Dyesha verbalizes understanding of the findings of todays visit. We also reviewed her medications today and discussed drug interactions and side  effects including but not limited excessive drowsiness and altered mental states. We also discussed that there is always a risk not just to her but also people around her. she has been encouraged to call the office with any questions or concerns that should arise related to todays visit.  No orders of the defined types were placed in this encounter.    Time spent: 25 This patient was seen by Orson Gear AGNP-C in Collaboration with Dr. Devona Konig as a part of collaborative care agreement.   I have personally obtained a history, examined the patient, evaluated laboratory and imaging results, formulated the assessment and plan and placed orders.    Allyne Gee, MD [facility] Pulmonary and Critical Care Sleep medicine

## 2019-11-16 ENCOUNTER — Telehealth: Payer: Self-pay

## 2019-11-16 NOTE — Telephone Encounter (Signed)
Pt called concerned about the anoro ellipta inhaler samples provided at the last visit. Pt states that she read the papers in the sample and the papers states to consult with doctor if pt has asthma, thyroid issues, problems urinating, hypertension, and constipation. Pt was also concerned with the possibility of increasing her heart rate.   Spoke with Quita Skye and advised pt that these are only possibilities and if she is not having trouble with the inhaler then to continue use, but if pt begins to have problems then to stop using the inhaler and call us with any more concerns. Pt understood.

## 2019-11-23 ENCOUNTER — Other Ambulatory Visit: Payer: Self-pay | Admitting: Nurse Practitioner

## 2019-11-23 DIAGNOSIS — Z1231 Encounter for screening mammogram for malignant neoplasm of breast: Secondary | ICD-10-CM

## 2019-12-01 ENCOUNTER — Other Ambulatory Visit: Payer: Self-pay

## 2019-12-01 ENCOUNTER — Encounter: Payer: Self-pay | Admitting: Nurse Practitioner

## 2019-12-01 ENCOUNTER — Ambulatory Visit (INDEPENDENT_AMBULATORY_CARE_PROVIDER_SITE_OTHER): Payer: Medicare Other | Admitting: Nurse Practitioner

## 2019-12-01 VITALS — BP 149/82 | HR 89 | Temp 97.2°F | Resp 16 | Ht 60.05 in | Wt 151.0 lb

## 2019-12-01 DIAGNOSIS — M79605 Pain in left leg: Secondary | ICD-10-CM | POA: Diagnosis not present

## 2019-12-01 DIAGNOSIS — I10 Essential (primary) hypertension: Secondary | ICD-10-CM

## 2019-12-01 DIAGNOSIS — M064 Inflammatory polyarthropathy: Secondary | ICD-10-CM | POA: Diagnosis not present

## 2019-12-01 DIAGNOSIS — F331 Major depressive disorder, recurrent, moderate: Secondary | ICD-10-CM | POA: Diagnosis not present

## 2019-12-01 DIAGNOSIS — M79604 Pain in right leg: Secondary | ICD-10-CM

## 2019-12-01 MED ORDER — CITALOPRAM HYDROBROMIDE 10 MG PO TABS: 10.0000 mg | ORAL_TABLET | Freq: Every day | ORAL | 3 refills | Status: DC

## 2019-12-01 MED ORDER — ANORO ELLIPTA 62.5-25 MCG/INH IN AEPB: 1.0000 | INHALATION_SPRAY | Freq: Every day | RESPIRATORY_TRACT | 1 refills | Status: DC

## 2019-12-01 NOTE — Progress Notes (Signed)
[facility] [place], [place] [address]  Internal MEDICINE  Office Visit Note  Patient Name: Vanessa Evans  [id number]  [id number]  Date of Service: 12/18/2019  Chief Complaint  Patient presents with  . Acute Visit    constipation problems   . Leg Pain    cant stand very long   . Medication Refill    inhaler      The patient presents to the office for acute visit. Today, she is c/o severe pain in both lower extremities as well as swelling. Denies injury or trauma to the legs. She states that they just ache all the time. We did ry changing her antidepressant medication to Cymbalta to see if that may reduce pain and improve depressive symptoms. She states that this has made little difference, if any, and would like to go back to her citalopram. She is also concerned that pain and fatigue may be coming from iron deficiency anemia or perhaps vitamin d deficiency.   Pt is here for a sick visit.     Current Medication:  Outpatient Encounter Medications as of 12/01/2019  Medication Sig  . acetaminophen (TYLENOL) 500 MG tablet Take 1,000 mg by mouth 2 (two) times daily as needed for moderate pain.  Marland Kitchen albuterol (PROVENTIL) (2.5 MG/3ML) 0.083% nebulizer solution Take 3 mLs (2.5 mg total) by nebulization every 6 (six) hours as needed for wheezing or shortness of breath.  Marland Kitchen albuterol (VENTOLIN HFA) 108 (90 Base) MCG/ACT inhaler Inhale 2 puffs into the lungs every 4 (four) hours as needed for wheezing or shortness of breath.  Marland Kitchen aspirin EC 81 MG tablet Take 81 mg by mouth at bedtime.   . benzonatate (TESSALON) 100 MG capsule Take 2 capsules (200 mg total) by mouth 2 (two) times daily as needed for cough.  Marland Kitchen BLACK ELDERBERRY PO Take 1 Dose by mouth 2 (two) times a day.  . Calcium Carbonate-Vitamin D (CALCIUM 600+D) 600-400 MG-UNIT tablet Take 1 tablet by mouth 2 (two) times daily.  . cholecalciferol (VITAMIN D) 1000 UNITS tablet Take 1,000 Units by mouth at  bedtime.   . cyanocobalamin 500 MCG tablet Take 500 mcg by mouth daily.  Marland Kitchen Dextran 70-Hypromellose (ARTIFICIAL TEARS) 0.1-0.3 % SOLN Apply 1-2 drops to eye daily.  Marland Kitchen diltiazem (CARDIZEM CD) 120 MG 24 hr capsule Take 120 mg by mouth at bedtime.   Marland Kitchen diltiazem (TIAZAC) 120 MG 24 hr capsule Take 1 capsule by mouth daily.  . diphenhydrAMINE (BENADRYL) 25 MG tablet Take 25-50 mg by mouth daily as needed for itching.  . docusate sodium (COLACE) 100 MG capsule Take 200 mg by mouth at bedtime as needed for moderate constipation.   Marland Kitchen ELDERBERRY PO Take 1 tablet by mouth 2 (two) times daily.  Marland Kitchen EPINEPHrine (EPIPEN 2-PAK) 0.3 mg/0.3 mL IJ SOAJ injection use as directed for severe allergy reaction  . estradiol (ESTRACE VAGINAL) 0.1 MG/GM vaginal cream Apply a pea-sized amount to urethra at bedtime  . fenofibrate 160 MG tablet take1 tab po daily for chol  . folic acid (FOLVITE) A999333 MCG tablet Take 400 mcg by mouth daily.  Marland Kitchen guaiFENesin (MUCINEX) 600 MG 12 hr tablet Take 600 mg by mouth at bedtime as needed (congestion).  . Hypromellose (ARTIFICIAL TEARS OP) Place 1-2 drops into both eyes daily as needed (dry eyes).  Marland Kitchen ketotifen (ZADITOR) 0.025 % ophthalmic solution Place 1 drop into both eyes 2 (two) times daily as needed (allergies).  Marland Kitchen levocetirizine (XYZAL) 5 MG tablet  Take 1 tablet (5 mg total) by mouth every evening.  Marland Kitchen levothyroxine (SYNTHROID, LEVOTHROID) 50 MCG tablet Take 50 mcg by mouth daily before breakfast. Brand Name only  . Magnesium 250 MG TABS Take 1 tablet by mouth daily.  . Melatonin 5 MG TABS Take 1 tablet by mouth at bedtime. For sleep   . Multiple Vitamins-Minerals (MULTIVITAMIN WITH MINERALS) tablet Take 1 tablet by mouth daily.  . pantoprazole (PROTONIX) 40 MG tablet Take 1 tablet (40 mg total) by mouth 2 (two) times daily.  Marland Kitchen Phenylephrine-guaiFENesin 10-400 MG TABS Take 1 tablet by mouth every 4 (four) hours as needed (congestion).  . pyridoxine (B-6) 100 MG tablet Take 100 mg by  mouth daily.  . sodium chloride (OCEAN) 0.65 % SOLN nasal spray Place 1 spray into both nostrils 2 (two) times daily.  Marland Kitchen Specialty Vitamins Products (ECHINACEA C COMPLETE PO) Take by mouth.  . Vitamin A 7.5 MG (25000 UT) CAPS Take 1 tablet by mouth 2 (two) times daily.  Marland Kitchen zinc gluconate 50 MG tablet Take 50 mg by mouth daily.  . [DISCONTINUED] DULoxetine (CYMBALTA) 30 MG capsule Take 1 capsule (30 mg total) by mouth daily.  . [DISCONTINUED] fluticasone (FLONASE) 50 MCG/ACT nasal spray USE 2 SPRAYS EACH NOSTRIL TWICE A DAY  . citalopram (CELEXA) 10 MG tablet Take 1 tablet (10 mg total) by mouth daily.  . [DISCONTINUED] predniSONE (STERAPRED UNI-PAK 48 TAB) 10 MG (48) TBPK tablet 12 day taper - take by mouth as directed for 12 days (Patient not taking: Reported on 12/01/2019)   No facility-administered encounter medications on file as of 12/01/2019.      Medical History: Past Medical History:  Diagnosis Date  . Arthritis    osteo  . Asthma    needs rescue inhaler few times a year  . Depression   . Dizziness    light headed spells but it has been a while  . Dysrhythmia    tachycardia.(cardizem). brady during procedures  . GERD (gastroesophageal reflux disease)    gastritis  . Headache(784.0)   . Heart murmur 2019   aortic. dr. Nehemiah Massed not worried about this  . Hyperlipidemia   . Hypertension   . Hypothyroidism   . Migraine   . Orthopnea   . Pneumonia    long time ago (greater than 5 years ago)  . PONV (postoperative nausea and vomiting)    very anxious during cataract surgery. brady during colonoscopy  . Shortness of breath    any exertion, cannot lie flat     Today's Vitals   12/01/19 1141  BP: (!) 149/82  Pulse: 89  Resp: 16  Temp: (!) 97.2 F (36.2 C)  SpO2: 93%  Weight: 151 lb (68.5 kg)  Height: 5' 0.05" (1.525 m)   Body mass index is 29.44 kg/m.  Review of Systems  Constitutional: Positive for fatigue. Negative for chills and unexpected weight change.        Fatigue continues to be moderate to severe.   HENT: Negative for congestion, rhinorrhea, sinus pressure, sneezing and sore throat.   Respiratory: Negative for cough, chest tightness, shortness of breath and wheezing.   Cardiovascular: Positive for leg swelling. Negative for chest pain and palpitations.       Bilateral lower leg pain. Mild swelling in the feet and ankles   Gastrointestinal: Positive for constipation. Negative for abdominal pain, diarrhea, nausea and vomiting.  Endocrine: Negative for cold intolerance, heat intolerance, polydipsia and polyuria.  Musculoskeletal: Positive for arthralgias and myalgias.  Negative for back pain, joint swelling and neck pain.       Generalized joint pain  Skin: Negative for rash.  Allergic/Immunologic: Negative for environmental allergies.  Neurological: Positive for dizziness, weakness and headaches. Negative for tremors and numbness.  Hematological: Negative for adenopathy. Does not bruise/bleed easily.  Psychiatric/Behavioral: Positive for dysphoric mood. Negative for behavioral problems and sleep disturbance. The patient is not nervous/anxious.     Physical Exam Vitals and nursing note reviewed.  Constitutional:      General: She is not in acute distress.    Appearance: Normal appearance. She is well-developed. She is not diaphoretic.  HENT:     Head: Normocephalic and atraumatic.     Mouth/Throat:     Pharynx: No oropharyngeal exudate.  Eyes:     Pupils: Pupils are equal, round, and reactive to light.  Neck:     Thyroid: No thyromegaly.     Vascular: No JVD.     Trachea: No tracheal deviation.  Cardiovascular:     Rate and Rhythm: Normal rate and regular rhythm.     Pulses: Normal pulses.     Heart sounds: Normal heart sounds. No murmur. No friction rub. No gallop.      Comments: ABI performed in the office today. Right Brachial 148 and Right ankle 150 The left Brachial 150 and left ankle 157 and final calculation  Right leg 1.00  and Left Leg 1.04. results normal.  Pulmonary:     Effort: Pulmonary effort is normal. No respiratory distress.     Breath sounds: Normal breath sounds. No wheezing or rales.  Chest:     Chest wall: No tenderness.  Abdominal:     Palpations: Abdomen is soft.  Musculoskeletal:        General: Normal range of motion.     Cervical back: Normal range of motion and neck supple.     Comments: There is generalized joint pain as well as moderate mid back pain. No point tenderness present. No acute abnormalities or deformities which are appreciated today.   Lymphadenopathy:     Cervical: No cervical adenopathy.  Skin:    General: Skin is warm and dry.  Neurological:     Mental Status: She is alert and oriented to person, place, and time. Mental status is at baseline.     Cranial Nerves: No cranial nerve deficit.  Psychiatric:        Attention and Perception: Attention and perception normal.        Mood and Affect: Mood is depressed.        Speech: Speech normal.        Behavior: Behavior normal.        Thought Content: Thought content normal.        Cognition and Memory: Cognition and memory normal.        Judgment: Judgment normal.   Assessment/Plan: 1. Pain in both lower extremities ABI done in office today. Normal results. Right Brachial 148 and Right ankle 150 The left Brachial 150 and left ankle 157 and final calculation Right leg 1.00 and Left Leg 1.04. will get arterial duplex ultrasound of bilateral lower extremities for further evaluation.  - POCT ABI Screening Pilot No Charge - VAS [place] LOWER EXTREMITY ARTERIAL DUPLEX; Future  2. Moderate episode of recurrent major depressive disorder (HCC) D/c duloxetine and return to  Citalopram daily. Written instructions provided for her to wean off use of duloxetine while restarting citalopram. Patient voiced understanding and agreement with this plan.  -  citalopram (CELEXA) 10 MG tablet; Take 1 tablet (10 mg total) by mouth daily.  Dispense:  30 tablet; Refill: 3  3. Inflammatory polyarthritis ([place]) Continue to take tylenol as needed and as indicated. Will monitor.   4. Benign essential HTN Stable. Continue bp medication as prescribed   5. Iron deficiency anemia Will check labs with full anemia panel for further evaluation.   General Counseling: Kyann verbalizes understanding of the findings of todays visit and agrees with plan of treatment. I have discussed any further diagnostic evaluation that may be needed or ordered today. We also reviewed her medications today. she has been encouraged to call the office with any questions or concerns that should arise related to todays visit.    Counseling:  This patient was seen by Leretha Pol FNP Collaboration with Dr Lavera Guise as a part of collaborative care agreement  Orders Placed This Encounter  Procedures  . VAS [place] LOWER EXTREMITY ARTERIAL DUPLEX  . POCT ABI Screening Pilot No Charge    Meds ordered this encounter  Medications  . citalopram (CELEXA) 10 MG tablet    Sig: Take 1 tablet (10 mg total) by mouth daily.    Dispense:  30 tablet    Refill:  3    Wean off duloxetine. Causing severe constipation.    Order Specific Question:   Supervising Provider    Answer:   Lavera Guise [id number]    Time spent: 45 Minutes

## 2019-12-05 ENCOUNTER — Telehealth: Payer: Self-pay

## 2019-12-05 ENCOUNTER — Ambulatory Visit
Admission: RE | Admit: 2019-12-05 | Discharge: 2019-12-05 | Disposition: A | Discharge status: Home / Self Care | Payer: Medicare Other | Source: Ambulatory Visit | Attending: Nurse Practitioner | Admitting: Nurse Practitioner

## 2019-12-05 ENCOUNTER — Other Ambulatory Visit
Admission: RE | Admit: 2019-12-05 | Discharge: 2019-12-05 | Disposition: A | Discharge status: Home / Self Care | Payer: Medicare Other | Source: Home / Self Care | Attending: Nurse Practitioner | Admitting: Nurse Practitioner

## 2019-12-05 DIAGNOSIS — R5383 Other fatigue: Secondary | ICD-10-CM | POA: Insufficient documentation

## 2019-12-05 DIAGNOSIS — D509 Iron deficiency anemia, unspecified: Secondary | ICD-10-CM | POA: Insufficient documentation

## 2019-12-05 DIAGNOSIS — Z1231 Encounter for screening mammogram for malignant neoplasm of breast: Secondary | ICD-10-CM | POA: Diagnosis not present

## 2019-12-05 LAB — CBC
HCT: 39.7 % (ref 36.0–46.0)
Hemoglobin: 12.5 g/dL (ref 12.0–15.0)
MCH: 29.1 pg (ref 26.0–34.0)
MCHC: 31.5 g/dL (ref 30.0–36.0)
MCV: 92.3 fL (ref 80.0–100.0)
Platelets: 319 10*3/uL (ref 150–400)
RBC: 4.3 MIL/uL (ref 3.87–5.11)
RDW: 13 % (ref 11.5–15.5)
WBC: 5 10*3/uL (ref 4.0–10.5)
nRBC: 0 % (ref 0.0–0.2)

## 2019-12-05 LAB — IRON AND TIBC
Iron: 80 ug/dL (ref 28–170)
Saturation Ratios: 18 % (ref 10.4–31.8)
TIBC: 454 ug/dL — ABNORMAL HIGH (ref 250–450)
UIBC: 374 ug/dL

## 2019-12-05 LAB — VITAMIN D 25 HYDROXY (VIT D DEFICIENCY, FRACTURES): Vit D, 25-Hydroxy: 60.72 ng/mL (ref 30–100)

## 2019-12-05 LAB — VITAMIN B12: Vitamin B-12: 1305 pg/mL — ABNORMAL HIGH (ref 180–914)

## 2019-12-05 LAB — FOLATE: Folate: 99 ng/mL (ref >5.9)

## 2019-12-05 NOTE — Telephone Encounter (Signed)
PT NOTIFIED  

## 2019-12-05 NOTE — Progress Notes (Signed)
Please let the patient know that her blood count was normal. Thanks.

## 2019-12-05 NOTE — Telephone Encounter (Signed)
-----   Message from Ronnell Freshwater, NP sent at 12/05/2019 11:09 AM EDT ----- Please let the patient know that her blood count was normal. Thanks.

## 2019-12-11 ENCOUNTER — Other Ambulatory Visit: Payer: Self-pay | Admitting: Adult Health

## 2019-12-11 DIAGNOSIS — J301 Allergic rhinitis due to pollen: Secondary | ICD-10-CM

## 2019-12-15 ENCOUNTER — Other Ambulatory Visit: Payer: Medicare Other

## 2019-12-18 DIAGNOSIS — M79605 Pain in left leg: Secondary | ICD-10-CM | POA: Insufficient documentation

## 2019-12-21 ENCOUNTER — Ambulatory Visit: Payer: Medicare Other | Admitting: Nurse Practitioner

## 2019-12-26 ENCOUNTER — Ambulatory Visit: Payer: Medicare Other | Admitting: Podiatry

## 2019-12-26 DIAGNOSIS — M79672 Pain in left foot: Secondary | ICD-10-CM | POA: Diagnosis not present

## 2019-12-26 DIAGNOSIS — M19072 Primary osteoarthritis, left ankle and foot: Secondary | ICD-10-CM | POA: Diagnosis not present

## 2019-12-26 DIAGNOSIS — M87072 Idiopathic aseptic necrosis of left ankle: Secondary | ICD-10-CM | POA: Diagnosis not present

## 2019-12-26 DIAGNOSIS — M87071 Idiopathic aseptic necrosis of right ankle: Secondary | ICD-10-CM | POA: Diagnosis not present

## 2019-12-26 DIAGNOSIS — M79671 Pain in right foot: Secondary | ICD-10-CM | POA: Diagnosis not present

## 2019-12-26 DIAGNOSIS — M19071 Primary osteoarthritis, right ankle and foot: Secondary | ICD-10-CM | POA: Diagnosis not present

## 2019-12-29 ENCOUNTER — Other Ambulatory Visit: Payer: Medicare Other

## 2020-01-09 ENCOUNTER — Ambulatory Visit: Payer: Medicare Other | Admitting: Nurse Practitioner

## 2020-01-10 ENCOUNTER — Telehealth: Payer: Self-pay

## 2020-01-10 DIAGNOSIS — M19072 Primary osteoarthritis, left ankle and foot: Secondary | ICD-10-CM | POA: Diagnosis not present

## 2020-01-10 DIAGNOSIS — M19071 Primary osteoarthritis, right ankle and foot: Secondary | ICD-10-CM | POA: Diagnosis not present

## 2020-01-10 NOTE — Telephone Encounter (Signed)
Confirmed and screened for 01-12-20 ov. 

## 2020-01-11 ENCOUNTER — Ambulatory Visit: Payer: Medicare Other | Admitting: Internal Medicine

## 2020-01-11 ENCOUNTER — Other Ambulatory Visit: Payer: Self-pay | Admitting: Adult Health

## 2020-01-11 DIAGNOSIS — J301 Allergic rhinitis due to pollen: Secondary | ICD-10-CM

## 2020-01-12 ENCOUNTER — Telehealth: Payer: Self-pay

## 2020-01-12 ENCOUNTER — Other Ambulatory Visit: Payer: Self-pay

## 2020-01-12 ENCOUNTER — Ambulatory Visit: Payer: Medicare Other

## 2020-01-12 DIAGNOSIS — M79604 Pain in right leg: Secondary | ICD-10-CM | POA: Diagnosis not present

## 2020-01-12 DIAGNOSIS — M79605 Pain in left leg: Secondary | ICD-10-CM

## 2020-01-12 NOTE — Telephone Encounter (Signed)
Confirmed and screened for 01-16-20 ov. 

## 2020-01-16 ENCOUNTER — Encounter: Payer: Self-pay | Admitting: Adult Health

## 2020-01-16 ENCOUNTER — Ambulatory Visit (INDEPENDENT_AMBULATORY_CARE_PROVIDER_SITE_OTHER): Payer: Medicare Other | Admitting: Adult Health

## 2020-01-16 ENCOUNTER — Other Ambulatory Visit: Payer: Self-pay

## 2020-01-16 VITALS — BP 138/67 | HR 70 | Temp 97.4°F | Resp 16 | Ht 60.0 in | Wt 149.2 lb

## 2020-01-16 DIAGNOSIS — I10 Essential (primary) hypertension: Secondary | ICD-10-CM

## 2020-01-16 DIAGNOSIS — R0602 Shortness of breath: Secondary | ICD-10-CM

## 2020-01-16 DIAGNOSIS — R0600 Dyspnea, unspecified: Secondary | ICD-10-CM | POA: Diagnosis not present

## 2020-01-16 DIAGNOSIS — J301 Allergic rhinitis due to pollen: Secondary | ICD-10-CM | POA: Diagnosis not present

## 2020-01-16 DIAGNOSIS — R0609 Other forms of dyspnea: Secondary | ICD-10-CM

## 2020-01-16 MED ORDER — FLUTICASONE PROPIONATE 50 MCG/ACT NA SUSP: NASAL | 0 refills | Status: DC

## 2020-01-16 NOTE — Progress Notes (Signed)
[facility] [place], [place] [address]  Pulmonary Sleep Medicine   Office Visit Note  Patient Name: Vanessa Evans DOB: Jun 08, 1946 MRN [id number]  Date of Service: 01/16/2020  Complaints/HPI: She is here today for pulmonary follow up.  She reports doing well.  She is resting well at night on oxygen.  She reports waking up in the morning, and not being sleepy like she was.  She feels liek the 2 LPM is finally working well.  Her fatigue is improving.  She has tried Anoro inhaler since our last visit.  She does not feel like her symptoms changed. She feels like her sob has improved.  She does not notice when she is talking that she is sob anymore.  She only has it with exertion at this time. She has been using the albuterol inhaler as needed, and reports good results with this.   ROS  General: (-) fever, (-) chills, (-) night sweats, (-) weakness Skin: (-) rashes, (-) itching,. Eyes: (-) visual changes, (-) redness, (-) itching. Nose and Sinuses: (-) nasal stuffiness or itchiness, (-) postnasal drip, (-) nosebleeds, (-) sinus trouble. Mouth and Throat: (-) sore throat, (-) hoarseness. Neck: (-) swollen glands, (-) enlarged thyroid, (-) neck pain. Respiratory: - cough, (-) bloody sputum, - shortness of breath, - wheezing. Cardiovascular: - ankle swelling, (-) chest pain. Lymphatic: (-) lymph node enlargement. Neurologic: (-) numbness, (-) tingling. Psychiatric: (-) anxiety, (-) depression   Current Medication: Outpatient Encounter Medications as of 01/16/2020  Medication Sig   acetaminophen (TYLENOL) 500 MG tablet Take 1,000 mg by mouth 2 (two) times daily as needed for moderate pain.   albuterol (PROVENTIL) (2.5 MG/3ML) 0.083% nebulizer solution Take 3 mLs (2.5 mg total) by nebulization every 6 (six) hours as needed for wheezing or shortness of breath.   albuterol (VENTOLIN HFA) 108 (90 Base) MCG/ACT inhaler Inhale 2 puffs into the lungs every 4 (four)  hours as needed for wheezing or shortness of breath.   aspirin EC 81 MG tablet Take 81 mg by mouth at bedtime.    benzonatate (TESSALON) 100 MG capsule Take 2 capsules (200 mg total) by mouth 2 (two) times daily as needed for cough.   BLACK ELDERBERRY PO Take 1 Dose by mouth 2 (two) times a day.   Calcium Carbonate-Vitamin D (CALCIUM 600+D) 600-400 MG-UNIT tablet Take 1 tablet by mouth 2 (two) times daily.   cholecalciferol (VITAMIN D) 1000 UNITS tablet Take 1,000 Units by mouth at bedtime.    citalopram (CELEXA) 10 MG tablet Take 1 tablet (10 mg total) by mouth daily.   cyanocobalamin 500 MCG tablet Take 500 mcg by mouth daily.   Dextran 70-Hypromellose (ARTIFICIAL TEARS) 0.1-0.3 % SOLN Apply 1-2 drops to eye daily.   diltiazem (CARDIZEM CD) 120 MG 24 hr capsule Take 120 mg by mouth at bedtime.    diltiazem (TIAZAC) 120 MG 24 hr capsule Take 1 capsule by mouth daily.   diphenhydrAMINE (BENADRYL) 25 MG tablet Take 25-50 mg by mouth daily as needed for itching.   docusate sodium (COLACE) 100 MG capsule Take 200 mg by mouth at bedtime as needed for moderate constipation.    ELDERBERRY PO Take 1 tablet by mouth 2 (two) times daily.   EPINEPHrine (EPIPEN 2-PAK) 0.3 mg/0.3 mL IJ SOAJ injection use as directed for severe allergy reaction   estradiol (ESTRACE VAGINAL) 0.1 MG/GM vaginal cream Apply a pea-sized amount to urethra at bedtime   fenofibrate 160 MG tablet take1 tab po  daily for chol   fluticasone (FLONASE) 50 MCG/ACT nasal spray USE 2 SPRAYS EACH NOSTRIL TWICE A DAY   folic acid (FOLVITE) 443 MCG tablet Take 400 mcg by mouth daily.   guaiFENesin (MUCINEX) 600 MG 12 hr tablet Take 600 mg by mouth at bedtime as needed (congestion).   Hypromellose (ARTIFICIAL TEARS OP) Place 1-2 drops into both eyes daily as needed (dry eyes).   ketotifen (ZADITOR) 0.025 % ophthalmic solution Place 1 drop into both eyes 2 (two) times daily as needed (allergies).   levocetirizine (XYZAL) 5  MG tablet Take 1 tablet (5 mg total) by mouth every evening.   levothyroxine (SYNTHROID, LEVOTHROID) 50 MCG tablet Take 50 mcg by mouth daily before breakfast. Brand Name only   Magnesium 250 MG TABS Take 1 tablet by mouth daily.   Melatonin 5 MG TABS Take 1 tablet by mouth at bedtime. For sleep    Multiple Vitamins-Minerals (MULTIVITAMIN WITH MINERALS) tablet Take 1 tablet by mouth daily.   pantoprazole (PROTONIX) 40 MG tablet Take 1 tablet (40 mg total) by mouth 2 (two) times daily.   Phenylephrine-guaiFENesin 10-400 MG TABS Take 1 tablet by mouth every 4 (four) hours as needed (congestion).   pyridoxine (B-6) 100 MG tablet Take 100 mg by mouth daily.   sodium chloride (OCEAN) 0.65 % SOLN nasal spray Place 1 spray into both nostrils 2 (two) times daily.   Specialty Vitamins Products (ECHINACEA C COMPLETE PO) Take by mouth.   umeclidinium-vilanterol (ANORO ELLIPTA) 62.5-25 MCG/INH AEPB Inhale 1 puff into the lungs daily.   Vitamin A 7.5 MG (25000 UT) CAPS Take 1 tablet by mouth 2 (two) times daily.   zinc gluconate 50 MG tablet Take 50 mg by mouth daily.   No facility-administered encounter medications on file as of 01/16/2020.    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 cope.  all benign.  inflammatory process going on in bladder   BREAST BIOPSY Right    benign   BREAST SURGERY Right    lumpectomy   CATARACT EXTRACTION W/PHACO Left 12/25/2014   Procedure: CATARACT EXTRACTION PHACO AND INTRAOCULAR LENS PLACEMENT ([place]);  Surgeon: Birder Robson, MD;  Location: [facility] ORS;  Service: Ophthalmology;  Laterality: Left;  [place] 00:32 AP% 20.0 CDE 6.49   COLONOSCOPY W/ BIOPSIES     COLONOSCOPY W/ POLYPECTOMY  2002, 2004, 2005   adenomatous polyps removed   COLONOSCOPY WITH PROPOFOL  N/A 08/23/2017   Procedure: COLONOSCOPY WITH PROPOFOL;  Surgeon: Manya Silvas, MD;  Location: [facility] ENDOSCOPY;  Service: Endoscopy;  Laterality: N/A;   COLONOSCOPY WITH PROPOFOL N/A 11/08/2017   Procedure: COLONOSCOPY WITH PROPOFOL;  Surgeon: Manya Silvas, MD;  Location: [facility] ENDOSCOPY;  Service: Endoscopy;  Laterality: N/A;   CYSTOCELE REPAIR N/A 04/09/2016   Procedure: ANTERIOR REPAIR (CYSTOCELE);  Surgeon: Gae Dry, MD;  Location: [facility] ORS;  Service: Gynecology;  Laterality: N/A;   DIAGNOSTIC LAPAROSCOPY  2008   removed both ovaries with cysts, tubes and fibroids   DILATION AND CURETTAGE OF UTERUS  1973   ESOPHAGOGASTRODUODENOSCOPY (EGD) WITH PROPOFOL N/A 08/23/2017   Procedure: ESOPHAGOGASTRODUODENOSCOPY (EGD) WITH PROPOFOL;  Surgeon: Manya Silvas, MD;  Location[facility] END[facility]COPY;  Service: Endoscopy;  Laterality: N/A;   EYE SURGERY Bilateral 2016  HERNIA REPAIR Right 2008   Inguinal hernia Repair, ventral hernia repair   JOINT REPLACEMENT Right 2008   knee   KNEE ARTHROSCOPY Right    LUMBAR LAMINECTOMY/DECOMPRESSION MICRODISCECTOMY Right 03/21/2013   Procedure: Right Lumbar five-Sacral one Laminectomy for Synovial Cyst;  Surgeon: Faythe Ghee, MD;  Location: [facility] NEURO ORS;  Service: Neurosurgery;  Laterality: Right;  right   OOPHORECTOMY     REVERSE SHOULDER ARTHROPLASTY Right 02/22/2018   Procedure: REVERSE SHOULDER ARTHROPLASTY;  Surgeon: Corky Mull, MD;  Location: [facility] ORS;  Service: Orthopedics;  Laterality: Right;   TUBAL LIGATION     UNILATERAL SALPINGECTOMY Left 04/09/2016   Procedure: UNILATERAL SALPINGECTOMY;  Surgeon: Gae Dry, MD;  Location: [facility] ORS;  Service: Gynecology;  Laterality: Left;   UPPER GI ENDOSCOPY  2010   with biopsy of gastric erosion   VAGINAL HYSTERECTOMY N/A 04/09/2016   Procedure: HYSTERECTOMY VAGINAL;  Surgeon: Gae Dry, MD;  Location: [facility] ORS;  Service: Gynecology;  Laterality: N/A;   VAGINAL  HYSTERECTOMY  2017   and bladder tack    Medical History: Past Medical History:  Diagnosis Date   Arthritis    osteo   Asthma    needs rescue inhaler few times a year   Depression    Dizziness    light headed spells but it has been a while   Dysrhythmia    tachycardia.(cardizem). brady during procedures   GERD (gastroesophageal reflux disease)    gastritis   Headache(784.0)    Heart murmur 2019   aortic. dr. Nehemiah Massed not worried about this   Hyperlipidemia    Hypertension    Hypothyroidism    Migraine    Orthopnea    Pneumonia    long time ago (greater than 5 years ago)   PONV (postoperative nausea and vomiting)    very anxious during cataract surgery. brady during colonoscopy   Shortness of breath    any exertion, cannot lie flat    Family History: Family History  Problem Relation Age of Onset   Pulmonary fibrosis Mother    Melanoma Father    Cardiomyopathy Sister        and arrhythmia   Breast cancer Cousin        paternal 1st    Social History: Social History   Socioeconomic History   Marital status: Married    Spouse name: Not on file   Number of children: Not on file   Years of education: Not on file   Highest education level: Not on file  Occupational History   Not on file  Tobacco Use   Smoking status: Former Smoker    Types: Cigarettes    Quit date: 1967    Years since quitting: 54.4   Smokeless tobacco: Never Used  Substance and Sexual Activity   Alcohol use: Not Currently   Drug use: No   Sexual activity: Not Currently  Other Topics Concern   Not on file  Social History Narrative   Not on file   Social Determinants of Health   Financial Resource Strain:    Difficulty of Paying Living Expenses:   Food Insecurity:    Worried About Charity fundraiser in the Last Year:    Arboriculturist in the Last Year:   Transportation Needs:    Film/video editor (Medical):    Lack of Transportation  (Non-Medical):   Physical Activity:    Days of Exercise per Week:    Minutes of  Exercise per Session:   Stress:    Feeling of Stress :   Social Connections:    Frequency of Communication with Friends and Family:    Frequency of Social Gatherings with Friends and Family:    Attends Religious Services:    Active Member of Clubs or Organizations:    Attends Music therapist:    Marital Status:   Intimate Partner Violence:    Fear of Current or Ex-Partner:    Emotionally Abused:    Physically Abused:    Sexually Abused:     Vital Signs: Blood pressure 138/67, pulse 70, temperature (!) 97.4 F (36.3 C), resp. rate 16, height 5' (1.524 m), weight 149 lb 3.2 oz (67.7 kg), SpO2 94 %.  Examination: General Appearance: The patient is well-developed, well-nourished, and in no distress. Skin: Gross inspection of skin unremarkable. Head: normocephalic, no gross deformities. Eyes: no gross deformities noted. ENT: ears appear grossly normal no exudates. Neck: Supple. No thyromegaly. No LAD. Respiratory: clear bilaterally. Cardiovascular: Normal S1 and S2 without murmur or rub. Extremities: No cyanosis. pulses are equal. Neurologic: Alert and oriented. No involuntary movements.  LABS: Recent Results (from the past 2160 hour(s))  CBC     Status: None   Collection Time: 12/05/19 10:27 AM  Result Value Ref Range   WBC 5.0 4.0 - 10.5 K/uL   RBC 4.30 3.87 - 5.11 MIL/uL   Hemoglobin 12.5 12.0 - 15.0 g/dL   HCT 39.7 36.0 - 46.0 %   MCV 92.3 80.0 - 100.0 fL   MCH 29.1 26.0 - 34.0 pg   MCHC 31.5 30.0 - 36.0 g/dL   RDW 13.0 11.5 - 15.5 %   Platelets 319 150 - 400 K/uL   nRBC 0.0 0.0 - 0.2 %    Comment: Performed at [facility], [address] [place], [place] [address]  Iron and TIBC     Status: Abnormal   Collection Time: 12/05/19 10:27 AM  Result Value Ref Range   Iron 80 28 - 170 ug/dL   TIBC 454 (H) 250 - 450 ug/dL   Saturation Ratios 18 10.4 -  31.8 %   UIBC 374 ug/dL    Comment: Performed at [facility], [address] [place], [place] [address]  Vitamin B12     Status: Abnormal   Collection Time: 12/05/19 10:27 AM  Result Value Ref Range   Vitamin B-12 1,305 (H) 180 - 914 pg/mL    Comment: (NOTE) This assay is not validated for testing neonatal or myeloproliferative syndrome specimens for Vitamin B12 levels. Performed at [facility], [place] [address], [place], [place] [address]   Folate     Status: None   Collection Time: 12/05/19 10:27 AM  Result Value Ref Range   Folate 99.0 >5.9 ng/mL    Comment: RESULT CONFIRMED BY MANUAL DILUTION DAS Performed at [facility], [address] [place], [place] [address]   VITAMIN D 25 Hydroxy (Vit-D Deficiency, Fractures)     Status: None   Collection Time: 12/05/19 10:27 AM  Result Value Ref Range   Vit D, 25-Hydroxy 60.72 30 - 100 ng/mL    Comment: (NOTE) Vitamin D deficiency has been defined by the Institute of Medicine  and an Endocrine Society practice guideline as a level of serum 25-OH  vitamin D less than 20 ng/mL (1,2). The Endocrine Society went on to  further define vitamin D insufficiency as a level between 21 and 29  ng/mL (2).  1. IOM (Institute of Medicine). 2010. Dietary reference intakes for  calcium and D. [place]: The [employer]. 2. Holick MF, Binkley [place], Bischoff-Ferrari HA, et al. Evaluation,  treatment, and prevention of vitamin D deficiency: an Endocrine  Society clinical practice guideline, JCEM. 2011 Jul; 96(7): 1911-30. Performed at [facility], [place] [address] [place], [place] [address]     Radiology: MM 3D SCREEN BREAST BILATERAL  Result Date: 12/05/2019 CLINICAL DATA:  Screening. EXAM: DIGITAL SCREENING BILATERAL MAMMOGRAM WITH TOMO AND CAD COMPARISON:  Previous exam(s). ACR Breast Density Category b: There are scattered areas of fibroglandular density. FINDINGS: There are no findings  suspicious for malignancy. Images were processed with CAD. IMPRESSION: No mammographic evidence of malignancy. A result letter of this screening mammogram will be mailed directly to the patient. RECOMMENDATION: Screening mammogram in one year. (Code:SM-B-01Y) BI-RADS CATEGORY  1: Negative. Electronically Signed   By: Curlene Dolphin M.D.   On: 12/05/2019 12:59    No results found.  No results found.    Assessment and Plan: Patient Active Problem List   Diagnosis Date Noted   Pain in both lower extremities 12/18/2019   Inflammatory polyarthritis ([place]) 07/26/2019   Excessive daytime sleepiness 07/26/2019   Encounter for screening mammogram for malignant neoplasm of breast 07/26/2019   Intercostal muscle pain 06/25/2019   Positive ANA (antinuclear antibody) 05/01/2019   Primary osteoarthritis involving multiple joints 04/26/2019   Mild intermittent asthma without complication 78/46/9629   Seasonal allergic rhinitis due to pollen 08/22/2018   Chills with fever 08/11/2018   Sore throat 08/11/2018   Acute upper respiratory infection 08/11/2018   Candidiasis 08/11/2018   Iron deficiency anemia 05/05/2018   Right hip pain 04/27/2018   Accidental fall from furniture 04/27/2018   Vitamin D deficiency 04/27/2018   Cough 04/18/2018   Dysuria 04/18/2018   Status post reverse total shoulder replacement, right 02/22/2018   Abnormal ECG 02/18/2018   Pain in joint of right shoulder 02/13/2018   Abdominal aortic atherosclerosis ([place]) 02/13/2018   Episode of recurrent major depressive disorder ([place]) 02/13/2018   Encounter for general adult medical examination with abnormal findings 02/13/2018   Lipoma of right shoulder 01/31/2018   Rotator cuff tendinitis, right 01/31/2018   Chronic superficial gastritis without bleeding 12/12/2017   Schatzki's ring 12/12/2017   Acute gastritis 11/08/2017   Nausea 10/18/2017   Other fatigue 10/18/2017   Allergic rhinitis  09/20/2017   Eczema 09/20/2017   Hematuria 09/20/2017   Mixed hyperlipidemia 09/20/2017   Osteoarthritis 09/20/2017   Osteoporosis, post-menopausal 09/20/2017   Palpitations 07/05/2017   Epigastric pain 05/24/2017   Gastroesophageal reflux disease without esophagitis 05/24/2017   Impingement syndrome of left shoulder 10/02/2016   Trochanteric bursitis of left hip 10/02/2016   SOB (shortness of breath) 04/16/2016   Uterine prolapse 04/09/2016   Cystocele 04/09/2016   Hyponatremia 04/09/2016   Benign essential HTN 11/18/2015   Paroxysmal supraventricular tachycardia ([place]) 11/18/2015   Premature ventricular contraction 07/20/2014   History of colonic polyps 05/29/2014   Asthma 02/05/2014   Chest pain 02/05/2014   Increased frequency of urination 02/05/2014   Tachycardia 02/05/2014   Female stress incontinence 12/14/2013   Gross hematuria 12/14/2013   Incomplete emptying of bladder 12/14/2013   Other chronic cystitis without hematuria 12/14/2013    1. DOE (dyspnea on exertion) Improving, continue to use albuterol as indicated.   2. Allergic rhinitis due to pollen, unspecified seasonality Refilled Flonase.  - fluticasone (FLONASE) 50 MCG/ACT nasal spray; 2 sprays  in each nostril twice a day as needed.  Dispense: 16 g; Refill: 0  3. Benign essential HTN Controlled, continue present management.   4. Shortness of breath Did well with 6 min walk, o2 sat did not drop below 96%.  - 6 minute walk  General Counseling: I have discussed the findings of the evaluation and examination with Indiyah.  I have also discussed any further diagnostic evaluation thatmay be needed or ordered today. Lady verbalizes understanding of the findings of todays visit. We also reviewed her medications today and discussed drug interactions and side effects including but not limited excessive drowsiness and altered mental states. We also discussed that there is always a risk not just  to her but also people around her. she has been encouraged to call the office with any questions or concerns that should arise related to todays visit.  Orders Placed This Encounter  Procedures   6 minute walk    Order Specific Question:   Where should this test be performed?    Answer:   other     Time spent: 30 This patient was seen by Orson Gear AGNP-C in Collaboration with Dr. Devona Konig as a part of collaborative care agreement.   I have personally obtained a history, examined the patient, evaluated laboratory and imaging results, formulated the assessment and plan and placed orders.    Allyne Gee, MD [facility] Pulmonary and Critical Care Sleep medicine

## 2020-01-17 NOTE — Progress Notes (Signed)
Normal arterial ultrasound. Discuss at visit 01/23/2020

## 2020-01-19 ENCOUNTER — Telehealth: Payer: Self-pay

## 2020-01-19 NOTE — Telephone Encounter (Signed)
Confirmed and screened for 01-23-20 ov.

## 2020-01-23 ENCOUNTER — Ambulatory Visit (INDEPENDENT_AMBULATORY_CARE_PROVIDER_SITE_OTHER): Payer: Medicare Other | Admitting: Nurse Practitioner

## 2020-01-23 ENCOUNTER — Other Ambulatory Visit: Payer: Self-pay

## 2020-01-23 ENCOUNTER — Encounter: Payer: Self-pay | Admitting: Nurse Practitioner

## 2020-01-23 VITALS — BP 136/75 | HR 93 | Temp 97.4°F | Resp 16 | Ht 60.0 in | Wt 150.2 lb

## 2020-01-23 DIAGNOSIS — R300 Dysuria: Secondary | ICD-10-CM

## 2020-01-23 DIAGNOSIS — J4520 Mild intermittent asthma, uncomplicated: Secondary | ICD-10-CM

## 2020-01-23 DIAGNOSIS — N390 Urinary tract infection, site not specified: Secondary | ICD-10-CM

## 2020-01-23 DIAGNOSIS — M79605 Pain in left leg: Secondary | ICD-10-CM

## 2020-01-23 DIAGNOSIS — M79604 Pain in right leg: Secondary | ICD-10-CM

## 2020-01-23 DIAGNOSIS — I10 Essential (primary) hypertension: Secondary | ICD-10-CM

## 2020-01-23 LAB — POCT URINALYSIS DIPSTICK
Bilirubin, UA: NEGATIVE
Blood, UA: NEGATIVE
Glucose, UA: NEGATIVE
Ketones, UA: NEGATIVE
Nitrite, UA: NEGATIVE
Protein, UA: NEGATIVE
Spec Grav, UA: 1.01 (ref 1.010–1.025)
Urobilinogen, UA: 0.2 E.U./dL
pH, UA: 5 (ref 5.0–8.0)

## 2020-01-23 MED ORDER — AMOXICILLIN-POT CLAVULANATE 875-125 MG PO TABS: 1.0000 | ORAL_TABLET | Freq: Two times a day (BID) | ORAL | 0 refills | Status: DC

## 2020-01-23 NOTE — Progress Notes (Signed)
[facility] [place], [place] [address]  Internal MEDICINE  Office Visit Note  Patient Name: Vanessa Evans  [id number]  [id number]  Date of Service: 02/04/2020  Chief Complaint  Patient presents with   Follow-up   Depression   Gastroesophageal Reflux   Hypertension   Hyperlipidemia    The patient is here for follow up. Has been c/o severe pain in both lower extremities as well as swelling. Denies injury or trauma to the legs. She states that they just ache all the time. ABI in the office was normal at her most recent visit. She did have arterial ultrasound of bilateral lower extremities. Results were normal. She has since seen podiatry. Had x-rays done of both feet and ankles. She does have severe osteoarthritis in both feet and ankles and possibly avascular necrosis. She has had some steroid injections into the feet and ankles which has helped some. The plan is fur surgery in the future. The patient states that she has also noted an odor in the urine. Feels like this has been going on for a month or two. She denies dysuria, frequency, or urgency. She denies abdominal pain or flank pain. There is no hematuria present.       Current Medication: Outpatient Encounter Medications as of 01/23/2020  Medication Sig   acetaminophen (TYLENOL) 500 MG tablet Take 1,000 mg by mouth 2 (two) times daily as needed for moderate pain.   albuterol (PROVENTIL) (2.5 MG/3ML) 0.083% nebulizer solution Take 3 mLs (2.5 mg total) by nebulization every 6 (six) hours as needed for wheezing or shortness of breath.   albuterol (VENTOLIN HFA) 108 (90 Base) MCG/ACT inhaler Inhale 2 puffs into the lungs every 4 (four) hours as needed for wheezing or shortness of breath.   aspirin EC 81 MG tablet Take 81 mg by mouth at bedtime.    benzonatate (TESSALON) 100 MG capsule Take 2 capsules (200 mg total) by mouth 2 (two) times daily as needed for cough.   BLACK ELDERBERRY PO Take 1  Dose by mouth 2 (two) times a day.   Calcium Carbonate-Vitamin D (CALCIUM 600+D) 600-400 MG-UNIT tablet Take 1 tablet by mouth 2 (two) times daily.   cholecalciferol (VITAMIN D) 1000 UNITS tablet Take 1,000 Units by mouth at bedtime.    citalopram (CELEXA) 10 MG tablet Take 1 tablet (10 mg total) by mouth daily.   cyanocobalamin 500 MCG tablet Take 500 mcg by mouth daily.   Dextran 70-Hypromellose (ARTIFICIAL TEARS) 0.1-0.3 % SOLN Apply 1-2 drops to eye daily.   diltiazem (CARDIZEM CD) 120 MG 24 hr capsule Take 120 mg by mouth at bedtime.    diltiazem (TIAZAC) 120 MG 24 hr capsule Take 1 capsule by mouth daily.   diphenhydrAMINE (BENADRYL) 25 MG tablet Take 25-50 mg by mouth daily as needed for itching.   docusate sodium (COLACE) 100 MG capsule Take 200 mg by mouth at bedtime as needed for moderate constipation.    ELDERBERRY PO Take 1 tablet by mouth 2 (two) times daily.   EPINEPHrine (EPIPEN 2-PAK) 0.3 mg/0.3 mL IJ SOAJ injection use as directed for severe allergy reaction   estradiol (ESTRACE VAGINAL) 0.1 MG/GM vaginal cream Apply a pea-sized amount to urethra at bedtime   fenofibrate 160 MG tablet take1 tab po daily for chol   fluticasone (FLONASE) 50 MCG/ACT nasal spray 2 sprays in each nostril twice a day as needed.   folic acid (FOLVITE) 193 MCG tablet Take 400 mcg by mouth daily.  guaiFENesin (MUCINEX) 600 MG 12 hr tablet Take 600 mg by mouth at bedtime as needed (congestion).   Hypromellose (ARTIFICIAL TEARS OP) Place 1-2 drops into both eyes daily as needed (dry eyes).   ketotifen (ZADITOR) 0.025 % ophthalmic solution Place 1 drop into both eyes 2 (two) times daily as needed (allergies).   levocetirizine (XYZAL) 5 MG tablet Take 1 tablet (5 mg total) by mouth every evening.   levothyroxine (SYNTHROID, LEVOTHROID) 50 MCG tablet Take 50 mcg by mouth daily before breakfast. Brand Name only   Magnesium 250 MG TABS Take 1 tablet by mouth daily.   Melatonin 5 MG TABS  Take 1 tablet by mouth at bedtime. For sleep    Multiple Vitamins-Minerals (MULTIVITAMIN WITH MINERALS) tablet Take 1 tablet by mouth daily.   pantoprazole (PROTONIX) 40 MG tablet Take 1 tablet (40 mg total) by mouth 2 (two) times daily.   Phenylephrine-guaiFENesin 10-400 MG TABS Take 1 tablet by mouth every 4 (four) hours as needed (congestion).   pyridoxine (B-6) 100 MG tablet Take 100 mg by mouth daily.   sodium chloride (OCEAN) 0.65 % SOLN nasal spray Place 1 spray into both nostrils 2 (two) times daily.   Specialty Vitamins Products (ECHINACEA C COMPLETE PO) Take by mouth.   umeclidinium-vilanterol (ANORO ELLIPTA) 62.5-25 MCG/INH AEPB Inhale 1 puff into the lungs daily.   Vitamin A 7.5 MG (25000 UT) CAPS Take 1 tablet by mouth 2 (two) times daily.   zinc gluconate 50 MG tablet Take 50 mg by mouth daily.   amoxicillin-clavulanate (AUGMENTIN) 875-125 MG tablet Take 1 tablet by mouth 2 (two) times daily.   No facility-administered encounter medications on file as of 01/23/2020.    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 cope.  all benign.  inflammatory process going on in bladder   BREAST BIOPSY Right    benign   BREAST SURGERY Right    lumpectomy   CATARACT EXTRACTION W/PHACO Left 12/25/2014   Procedure: CATARACT EXTRACTION PHACO AND INTRAOCULAR LENS PLACEMENT ([place]);  Surgeon: Birder Robson, MD;  Location: [facility] ORS;  Service: Ophthalmology;  Laterality: Left;  [place] 00:32 AP% 20.0 CDE 6.49   COLONOSCOPY W/ BIOPSIES     COLONOSCOPY W/ POLYPECTOMY  2002, 2004, 2005   adenomatous polyps removed   COLONOSCOPY WITH PROPOFOL N/A 08/23/2017   Procedure: COLONOSCOPY WITH PROPOFOL;  Surgeon: Manya Silvas, MD;  Location: [facility] ENDOSCOPY;  Service: Endoscopy;  Laterality: N/A;    COLONOSCOPY WITH PROPOFOL N/A 11/08/2017   Procedure: COLONOSCOPY WITH PROPOFOL;  Surgeon: Manya Silvas, MD;  Location: [facility] ENDOSCOPY;  Service: Endoscopy;  Laterality: N/A;   CYSTOCELE REPAIR N/A 04/09/2016   Procedure: ANTERIOR REPAIR (CYSTOCELE);  Surgeon: Gae Dry, MD;  Location: [facility] ORS;  Service: Gynecology;  Laterality: N/A;   DIAGNOSTIC LAPAROSCOPY  2008   removed both ovaries with cysts, tubes and fibroids   DILATION AND CURETTAGE OF UTERUS  1973   ESOPHAGOGASTRODUODENOSCOPY (EGD) WITH PROPOFOL N/A 08/23/2017   Procedure: ESOPHAGOGASTRODUODENOSCOPY (EGD) WITH PROPOFOL;  Surgeon: Manya Silvas, MD;  Location: [facility] ENDOSCOPY;  Service: Endoscopy;  Laterality: N/A;   EYE SURGERY Bilateral 2016   HERNIA REPAIR Right 2008   Inguinal hernia Repair, ventral hernia repair   JOINT REPLACEMENT Right  2008   knee   KNEE ARTHROSCOPY Right    LUMBAR LAMINECTOMY/DECOMPRESSION MICRODISCECTOMY Right 03/21/2013   Procedure: Right Lumbar five-Sacral one Laminectomy for Synovial Cyst;  Surgeon: Faythe Ghee, MD;  Location: [facility];  Service: Neurosurgery;  Laterality: Right;  right   OOPHORECTOMY     REVERSE SHOULDER ARTHROPLASTY Right 02/22/2018   Procedure: REVERSE SHOULDER ARTHROPLASTY;  Surgeon: Corky Mull, MD;  Location: [facility] ORS;  Service: Orthopedics;  Laterality: Right;   TUBAL LIGATION     UNILATERAL SALPINGECTOMY Left 04/09/2016   Procedure: UNILATERAL SALPINGECTOMY;  Surgeon: Gae Dry, MD;  Location: [facility] ORS;  Service: Gynecology;  Laterality: Left;   UPPER GI ENDOSCOPY  2010   with biopsy of gastric erosion   VAGINAL HYSTERECTOMY N/A 04/09/2016   Procedure: HYSTERECTOMY VAGINAL;  Surgeon: Gae Dry, MD;  Location: [facility] ORS;  Service: Gynecology;  Laterality: N/A;   VAGINAL HYSTERECTOMY  2017   and bladder tack    Medical History: Past Medical History:  Diagnosis Date   Arthritis    osteo   Asthma    needs rescue inhaler  few times a year   Depression    Dizziness    light headed spells but it has been a while   Dysrhythmia    tachycardia.(cardizem). brady during procedures   GERD (gastroesophageal reflux disease)    gastritis   Headache(784.0)    Heart murmur 2019   aortic. dr. Nehemiah Massed not worried about this   Hyperlipidemia    Hypertension    Hypothyroidism    Migraine    Orthopnea    Pneumonia    long time ago (greater than 5 years ago)   PONV (postoperative nausea and vomiting)    very anxious during cataract surgery. brady during colonoscopy   Shortness of breath    any exertion, cannot lie flat    Family History: Family History  Problem Relation Age of Onset   Pulmonary fibrosis Mother    Melanoma Father    Cardiomyopathy Sister        and arrhythmia   Breast cancer Cousin        paternal 1st    Social History   Socioeconomic History   Marital status: Married    Spouse name: Not on file   Number of children: Not on file   Years of education: Not on file   Highest education level: Not on file  Occupational History   Not on file  Tobacco Use   Smoking status: Former Smoker    Types: Cigarettes    Quit date: 18    Years since quitting: 54.5   Smokeless tobacco: Never Used  Scientific laboratory technician Use: Never used  Substance and Sexual Activity   Alcohol use: Not Currently   Drug use: No   Sexual activity: Not Currently  Other Topics Concern   Not on file  Social History Narrative   Not on file   Social Determinants of Health   Financial Resource Strain:    Difficulty of Paying Living Expenses:   Food Insecurity:    Worried About Charity fundraiser in the Last Year:    Arboriculturist in the Last Year:   Transportation Needs:    Film/video editor (Medical):    Lack of Transportation (Non-Medical):   Physical Activity:    Days of Exercise per Week:    Minutes of Exercise per Session:   Stress:    Feeling of  Stress :    Social Connections:    Frequency of Communication with Friends and Family:    Frequency of Social Gatherings with Friends and Family:    Attends Religious Services:    Active Member of Clubs or Organizations:    Attends Music therapist:    Marital Status:   Intimate Partner Violence:    Fear of Current or Ex-Partner:    Emotionally Abused:    Physically Abused:    Sexually Abused:       Review of Systems  Constitutional: Positive for fatigue. Negative for chills and unexpected weight change.       Fatigue continues to be moderate to severe.   HENT: Negative for congestion, rhinorrhea, sinus pressure, sneezing and sore throat.   Respiratory: Negative for cough, chest tightness, shortness of breath and wheezing.   Cardiovascular: Positive for leg swelling. Negative for chest pain and palpitations.       Bilateral lower leg pain. Mild swelling in the feet and ankles   Gastrointestinal: Positive for constipation. Negative for abdominal pain, diarrhea, nausea and vomiting.  Endocrine: Negative for cold intolerance, heat intolerance, polydipsia and polyuria.  Genitourinary: Negative for dysuria, hematuria and urgency.       Patient states that she has noted strong odor in her urine   Musculoskeletal: Positive for arthralgias and myalgias. Negative for back pain, joint swelling and neck pain.       Generalized joint pain  Skin: Negative for rash.  Allergic/Immunologic: Negative for environmental allergies.  Neurological: Positive for dizziness, weakness and headaches. Negative for tremors and numbness.  Hematological: Negative for adenopathy. Does not bruise/bleed easily.  Psychiatric/Behavioral: Positive for dysphoric mood. Negative for behavioral problems and sleep disturbance. The patient is not nervous/anxious.     Today's Vitals   01/23/20 1520  BP: 136/75  Pulse: 93  Resp: 16  Temp: (!) 97.4 F (36.3 C)  SpO2: 100%  Weight: 150 lb 3.2 oz (68.1 kg)   Height: 5' (1.524 m)   Body mass index is 29.33 kg/m.  Physical Exam Vitals and nursing note reviewed.  Constitutional:      General: She is not in acute distress.    Appearance: Normal appearance. She is well-developed. She is not diaphoretic.  HENT:     Head: Normocephalic and atraumatic.     Mouth/Throat:     Pharynx: No oropharyngeal exudate.  Eyes:     Pupils: Pupils are equal, round, and reactive to light.  Neck:     Thyroid: No thyromegaly.     Vascular: No JVD.     Trachea: No tracheal deviation.  Cardiovascular:     Rate and Rhythm: Normal rate and regular rhythm.     Pulses: Normal pulses.     Heart sounds: Normal heart sounds. No murmur heard.  No friction rub. No gallop.   Pulmonary:     Effort: Pulmonary effort is normal. No respiratory distress.     Breath sounds: Normal breath sounds. No wheezing or rales.  Chest:     Chest wall: No tenderness.  Abdominal:     Palpations: Abdomen is soft.  Genitourinary:    Comments: urine sample is positive for moderate WBC today.  Musculoskeletal:        General: Normal range of motion.     Cervical back: Normal range of motion and neck supple.     Comments: There is generalized joint pain as well as moderate mid back pain. No point tenderness present. No acute  abnormalities or deformities which are appreciated today.   Lymphadenopathy:     Cervical: No cervical adenopathy.  Skin:    General: Skin is warm and dry.  Neurological:     Mental Status: She is alert and oriented to person, place, and time. Mental status is at baseline.     Cranial Nerves: No cranial nerve deficit.  Psychiatric:        Attention and Perception: Attention and perception normal.        Mood and Affect: Mood is depressed.        Speech: Speech normal.        Behavior: Behavior normal.        Thought Content: Thought content normal.        Cognition and Memory: Cognition and memory normal.        Judgment: Judgment normal.     Assessment/Plan:  1. Pain in both lower extremities Reviewed results of arterial ultrasound of bilateral lower extremities with the patient. Results were normal.  She has seen podiatry since then and has significant arthritis in both feet for which she is receiving treatment. Will montiro closely.   2. Urinary tract infection without hematuria, site unspecified Urine sample positive for moderate WBC. Start augmentnin 875mg  twice daily for 10 days. Will send urine for culture and sensitivity and adust antibiotics as indicated.  - CULTURE, URINE COMPREHENSIVE - amoxicillin-clavulanate (AUGMENTIN) 875-125 MG tablet; Take 1 tablet by mouth 2 (two) times daily.  Dispense: 20 tablet; Refill: 0  3. Benign essential HTN Stable. Continue bp medication as prescribed .  4. Mild intermittent asthma without complication Doing well. Continue inhalers and respiratory medication as prescribed   5. Dysuria - POCT Urinalysis Dipstick  General Counseling: Vanessa Evans verbalizes understanding of the findings of todays visit and agrees with plan of treatment. I have discussed any further diagnostic evaluation that may be needed or ordered today. We also reviewed her medications today. she has been encouraged to call the office with any questions or concerns that should arise related to todays visit.  This patient was seen by Leretha Pol FNP Collaboration with Dr Lavera Guise as a part of collaborative care agreement  Orders Placed This Encounter  Procedures   CULTURE, URINE COMPREHENSIVE   POCT Urinalysis Dipstick    Meds ordered this encounter  Medications   amoxicillin-clavulanate (AUGMENTIN) 875-125 MG tablet    Sig: Take 1 tablet by mouth 2 (two) times daily.    Dispense:  20 tablet    Refill:  0    Order Specific Question:   Supervising Provider    Answer:   Lavera Guise [[id number]]    Total time spent: 30 Minutes   Time spent includes review of chart, medications, test results, and  follow up plan with the patient.      Dr Lavera Guise Internal medicine

## 2020-01-27 LAB — CULTURE, URINE COMPREHENSIVE

## 2020-02-04 DIAGNOSIS — N390 Urinary tract infection, site not specified: Secondary | ICD-10-CM | POA: Insufficient documentation

## 2020-02-15 ENCOUNTER — Other Ambulatory Visit: Payer: Self-pay

## 2020-02-15 DIAGNOSIS — E782 Mixed hyperlipidemia: Secondary | ICD-10-CM

## 2020-02-15 MED ORDER — FENOFIBRATE 160 MG PO TABS: ORAL_TABLET | ORAL | 1 refills | Status: DC

## 2020-02-21 IMAGING — MR MR SHOULDER*R* W/O CM
5 series · 40 of 40 positions shown · non-contrast
Comparison: MRI right shoulder 03/30/2014.

CLINICAL DATA: Chronic right shoulder pain.  No recent injury.

EXAM:
MRI OF THE RIGHT SHOULDER WITHOUT CONTRAST
TECHNIQUE: Multiplanar, multisequence MR imaging of the shoulder was performed.
No intravenous contrast was administered.

[Series 3: T2 fat-sat · axial · 4.0mm · 0.59mm/px · z∈[-50,+78]mm · 10 of 30 slices shown (1 of 3)]
[im 1/30]
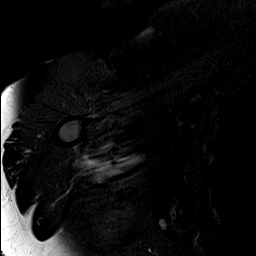
[im 4/30]
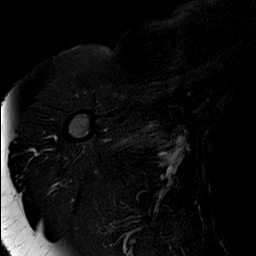
[im 7/30]
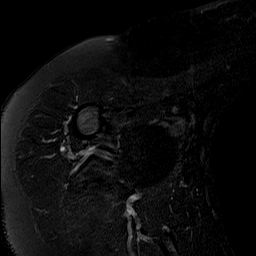
[im 10/30]
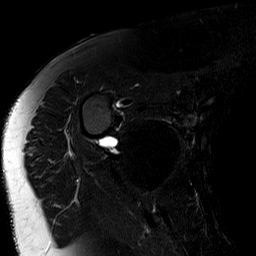
[im 13/30]
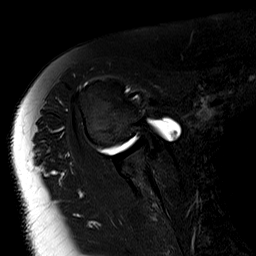
[im 17/30]
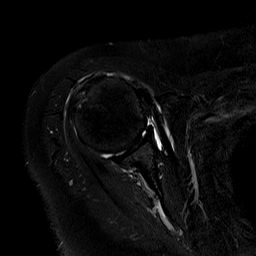
[im 20/30]
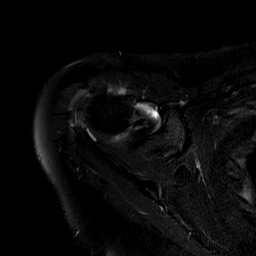
[im 23/30]
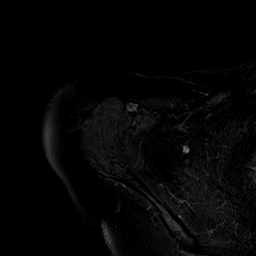
[im 26/30]
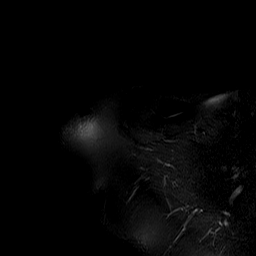
[im 30/30]
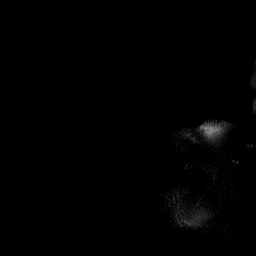

[Series 4: T2 fat-sat · oblique · 4.0mm · 0.59mm/px · 7 of 22 slices shown (2 of 3)]
[im 1/22]
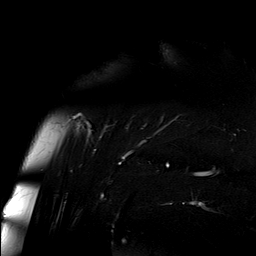
[im 4/22]
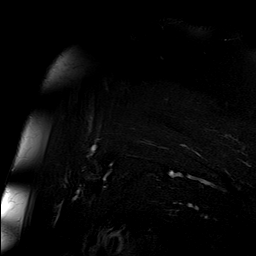
[im 8/22]
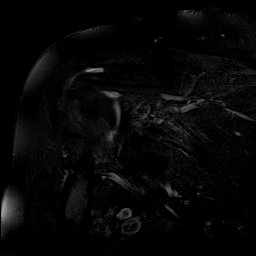
[im 11/22]
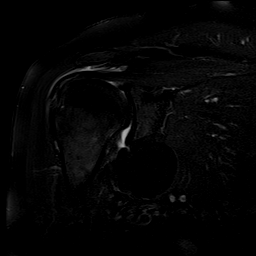
[im 15/22]
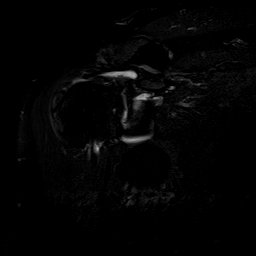
[im 18/22]
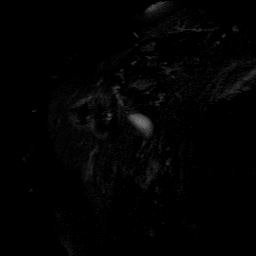
[im 22/22]
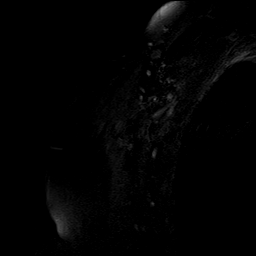

[Series 5: PD · oblique · 4.0mm · 0.59mm/px · 7 of 22 slices shown]
[im 1/22]
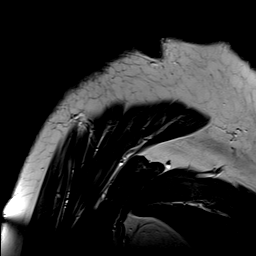
[im 4/22]
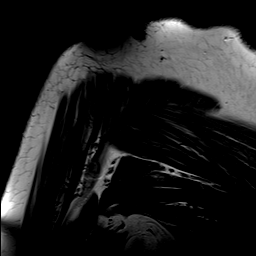
[im 8/22]
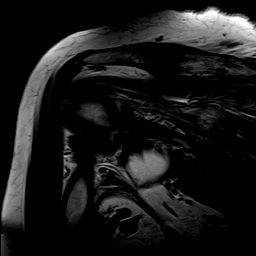
[im 11/22]
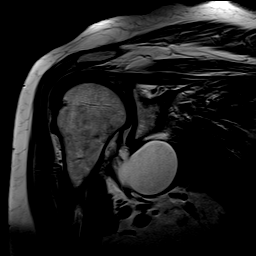
[im 15/22]
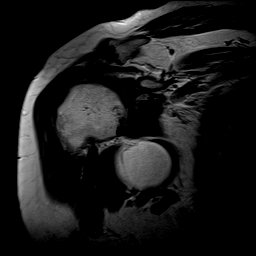
[im 18/22]
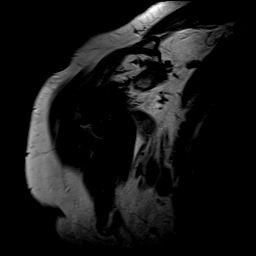
[im 22/22]
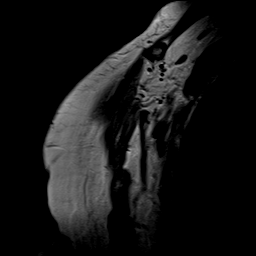

[Series 6: T1 · coronal · 4.0mm · 0.59mm/px · 8 of 23 slices shown]
[im 1/23]
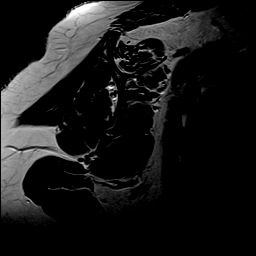
[im 4/23]
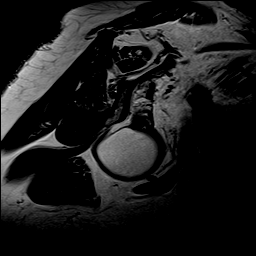
[im 7/23]
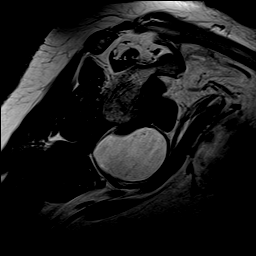
[im 10/23]
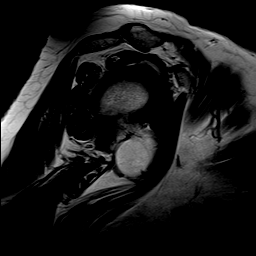
[im 13/23]
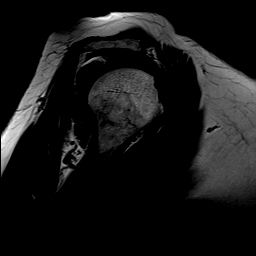
[im 16/23]
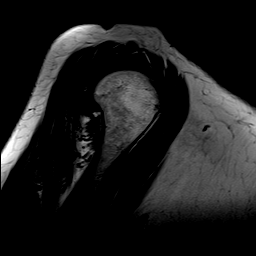
[im 19/23]
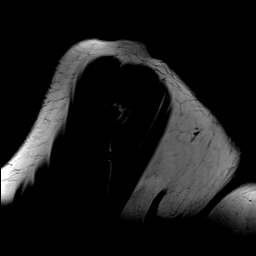
[im 23/23]
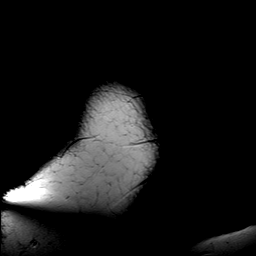

[Series 7: T2 fat-sat · coronal · 4.0mm · 0.59mm/px · 8 of 23 slices shown (3 of 3)]
[im 1/23]
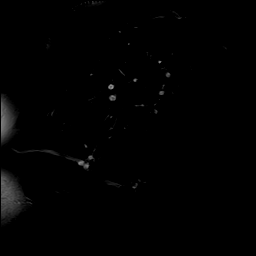
[im 4/23]
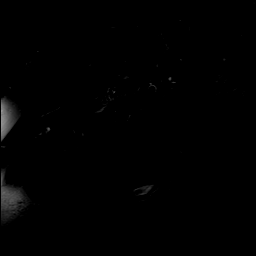
[im 7/23]
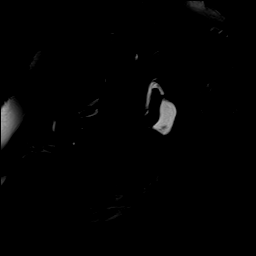
[im 10/23]
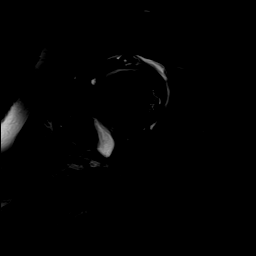
[im 13/23]
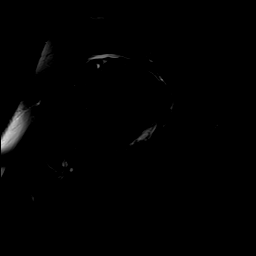
[im 16/23]
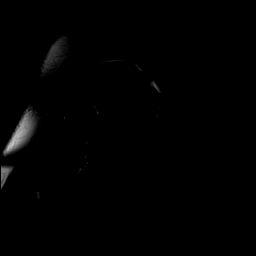
[im 19/23]
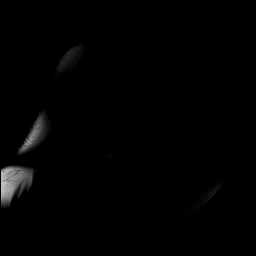
[im 23/23]
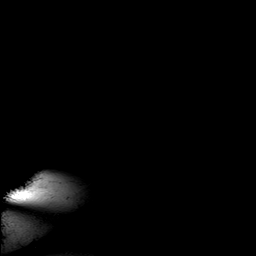

[40 of 40 positions shown; findings below may reference images not displayed]

FINDINGS: Rotator cuff: Full-thickness tear of the anterior and far lateral
supraspinatus which had measured 1.4 cm from front to back now
measures approximately 1.8 cm from front to back. Only a thin band
of the posterior most fibers of the tendon are preserved. Retraction
is nearly to the glenoid, 4.5-5.5 cm. Short segment interstitial
tear of the superior fibers of the subscapularis is identified.
Infraspinatus tendinopathy without tear is present.

Muscles: There is some fatty atrophy of both the supraspinatus and
subscapularis, unchanged since the prior exam.

Biceps long head: The intra-articular segment is attenuated with
tendinopathy identified. No complete tear. The appearance is worse
than on the prior MRI.

Acromioclavicular Joint: Moderate acromioclavicular osteoarthritis
is unchanged. Type 2 acromion. There is some fluid in the
subacromial/subdeltoid bursa.

Glenohumeral Joint: Mild degenerative change is present.

Labrum:  Intact.

Bones:  No fracture or worrisome lesion.

Other: Simple lipoma inferior to the glenoid measuring 3.7 x 3.1 x
4.4 cm is unchanged.
IMPRESSION: Large full-thickness supraspinatus tendon tear seen on the prior
examination has progressed and is now near complete with retraction
of 4.5-5.5 cm. Mild-to-moderate supraspinatus atrophy is unchanged.

Infraspinatus and subscapularis tendinopathy. Small interstitial
tear of the subscapularis is new since the prior exam.

Marked tendinosis and attenuation of the intra-articular long head
of biceps compatible with partial tear. The appearance is worse than
on the prior MRI.

Moderate acromioclavicular osteoarthritis, unchanged.

Small volume of subacromial/subdeltoid fluid compatible with
bursitis.

No change in a simple lipoma of the inferior glenoid.

## 2020-03-12 ENCOUNTER — Other Ambulatory Visit: Payer: Self-pay

## 2020-03-12 MED ORDER — EPINEPHRINE 0.3 MG/0.3ML IJ SOAJ: INTRAMUSCULAR | 1 refills | Status: AC

## 2020-03-19 DIAGNOSIS — M1651 Unilateral post-traumatic osteoarthritis, right hip: Secondary | ICD-10-CM | POA: Diagnosis not present

## 2020-03-19 DIAGNOSIS — M7061 Trochanteric bursitis, right hip: Secondary | ICD-10-CM | POA: Diagnosis not present

## 2020-03-19 DIAGNOSIS — M4317 Spondylolisthesis, lumbosacral region: Secondary | ICD-10-CM | POA: Diagnosis not present

## 2020-03-19 DIAGNOSIS — M25551 Pain in right hip: Secondary | ICD-10-CM | POA: Diagnosis not present

## 2020-03-19 DIAGNOSIS — M7062 Trochanteric bursitis, left hip: Secondary | ICD-10-CM | POA: Diagnosis not present

## 2020-03-19 DIAGNOSIS — M25552 Pain in left hip: Secondary | ICD-10-CM | POA: Diagnosis not present

## 2020-03-22 DIAGNOSIS — M5441 Lumbago with sciatica, right side: Secondary | ICD-10-CM | POA: Diagnosis not present

## 2020-03-22 DIAGNOSIS — I700 Atherosclerosis of aorta: Secondary | ICD-10-CM | POA: Diagnosis not present

## 2020-03-22 DIAGNOSIS — M5442 Lumbago with sciatica, left side: Secondary | ICD-10-CM | POA: Diagnosis not present

## 2020-03-22 DIAGNOSIS — M4319 Spondylolisthesis, multiple sites in spine: Secondary | ICD-10-CM | POA: Diagnosis not present

## 2020-03-22 DIAGNOSIS — G8929 Other chronic pain: Secondary | ICD-10-CM | POA: Diagnosis not present

## 2020-03-22 DIAGNOSIS — M5416 Radiculopathy, lumbar region: Secondary | ICD-10-CM | POA: Diagnosis not present

## 2020-03-22 DIAGNOSIS — M4726 Other spondylosis with radiculopathy, lumbar region: Secondary | ICD-10-CM | POA: Diagnosis not present

## 2020-03-23 ENCOUNTER — Other Ambulatory Visit: Payer: Self-pay | Admitting: Adult Health

## 2020-03-23 DIAGNOSIS — J301 Allergic rhinitis due to pollen: Secondary | ICD-10-CM

## 2020-03-25 ENCOUNTER — Other Ambulatory Visit: Payer: Self-pay | Admitting: Nurse Practitioner

## 2020-03-25 DIAGNOSIS — G8929 Other chronic pain: Secondary | ICD-10-CM

## 2020-03-25 DIAGNOSIS — M5442 Lumbago with sciatica, left side: Secondary | ICD-10-CM

## 2020-03-25 DIAGNOSIS — M5416 Radiculopathy, lumbar region: Secondary | ICD-10-CM

## 2020-04-08 ENCOUNTER — Telehealth: Payer: Self-pay

## 2020-04-08 NOTE — Telephone Encounter (Signed)
Confirmed and screened for 04-08-20 ov.

## 2020-04-09 ENCOUNTER — Encounter: Payer: Self-pay | Admitting: Nurse Practitioner

## 2020-04-09 ENCOUNTER — Other Ambulatory Visit: Payer: Self-pay

## 2020-04-09 ENCOUNTER — Ambulatory Visit (INDEPENDENT_AMBULATORY_CARE_PROVIDER_SITE_OTHER): Payer: Medicare Other | Admitting: Nurse Practitioner

## 2020-04-09 VITALS — BP 142/70 | HR 99 | Temp 97.6°F | Resp 16 | Ht 61.0 in | Wt 151.0 lb

## 2020-04-09 DIAGNOSIS — J301 Allergic rhinitis due to pollen: Secondary | ICD-10-CM

## 2020-04-09 DIAGNOSIS — J4520 Mild intermittent asthma, uncomplicated: Secondary | ICD-10-CM

## 2020-04-09 DIAGNOSIS — I10 Essential (primary) hypertension: Secondary | ICD-10-CM | POA: Diagnosis not present

## 2020-04-09 DIAGNOSIS — F40240 Claustrophobia: Secondary | ICD-10-CM

## 2020-04-09 DIAGNOSIS — M064 Inflammatory polyarthropathy: Secondary | ICD-10-CM | POA: Diagnosis not present

## 2020-04-09 DIAGNOSIS — R300 Dysuria: Secondary | ICD-10-CM

## 2020-04-09 DIAGNOSIS — F331 Major depressive disorder, recurrent, moderate: Secondary | ICD-10-CM

## 2020-04-09 DIAGNOSIS — K279 Peptic ulcer, site unspecified, unspecified as acute or chronic, without hemorrhage or perforation: Secondary | ICD-10-CM | POA: Diagnosis not present

## 2020-04-09 LAB — POCT URINALYSIS DIPSTICK
Bilirubin, UA: NEGATIVE
Blood, UA: NEGATIVE
Glucose, UA: NEGATIVE
Ketones, UA: NEGATIVE
Leukocytes, UA: NEGATIVE
Nitrite, UA: NEGATIVE
Protein, UA: NEGATIVE
Spec Grav, UA: 1.01 (ref 1.010–1.025)
Urobilinogen, UA: 0.2 E.U./dL
pH, UA: 7 (ref 5.0–8.0)

## 2020-04-09 MED ORDER — FLUTICASONE PROPIONATE 50 MCG/ACT NA SUSP: NASAL | 0 refills | Status: DC

## 2020-04-09 MED ORDER — DICLOFENAC SODIUM 1 % EX GEL: 4.0000 g | Freq: Four times a day (QID) | CUTANEOUS | 3 refills | Status: DC

## 2020-04-09 MED ORDER — DIAZEPAM 5 MG PO TABS: ORAL_TABLET | ORAL | 0 refills | Status: DC

## 2020-04-09 MED ORDER — CITALOPRAM HYDROBROMIDE 10 MG PO TABS: 10.0000 mg | ORAL_TABLET | Freq: Every day | ORAL | 1 refills | Status: DC

## 2020-04-09 MED ORDER — SUCRALFATE 1 GM/10ML PO SUSP: 1.0000 g | Freq: Three times a day (TID) | ORAL | 2 refills | Status: DC

## 2020-04-09 NOTE — Progress Notes (Signed)
[facility] [place], [place] [address]  Internal MEDICINE  Office Visit Note  Patient Name: Vanessa Evans  [id number]  [id number]  Date of Service: 04/21/2020  Chief Complaint  Patient presents with  . Follow-up  . Depression  . Hyperlipidemia  . Hypertension  . Urinary Tract Infection  . Medication Refill    The patient is here for routine follow up visit. She states that she has been having trouble with starting a stream of urine and had noted stronger than usual odor in the urine. U/a today is clear of infection or other abnormalities.  She is haing a MRI on Thursday. She is severely claustrophobic. Has had prescription for PRN medication in the past to help with this and would like to have new prescription for this today.  The patient is starting have abdominal pain, similar to that when she had colitis and duodenal ulcer in the past.       Current Medication: Outpatient Encounter Medications as of 04/09/2020  Medication Sig  . acetaminophen (TYLENOL) 500 MG tablet Take 1,000 mg by mouth 2 (two) times daily as needed for moderate pain.  Marland Kitchen albuterol (PROVENTIL) (2.5 MG/3ML) 0.083% nebulizer solution Take 3 mLs (2.5 mg total) by nebulization every 6 (six) hours as needed for wheezing or shortness of breath.  Marland Kitchen albuterol (VENTOLIN HFA) 108 (90 Base) MCG/ACT inhaler Inhale 2 puffs into the lungs every 4 (four) hours as needed for wheezing or shortness of breath.  Marland Kitchen aspirin EC 81 MG tablet Take 81 mg by mouth at bedtime.   . benzonatate (TESSALON) 100 MG capsule Take 2 capsules (200 mg total) by mouth 2 (two) times daily as needed for cough.  Marland Kitchen BLACK ELDERBERRY PO Take 1 Dose by mouth 2 (two) times a day.  . Calcium Carbonate-Vitamin D (CALCIUM 600+D) 600-400 MG-UNIT tablet Take 1 tablet by mouth 2 (two) times daily.  . cholecalciferol (VITAMIN D) 1000 UNITS tablet Take 1,000 Units by mouth at bedtime.   . citalopram (CELEXA) 10 MG tablet Take 1  tablet (10 mg total) by mouth daily.  . cyanocobalamin 500 MCG tablet Take 500 mcg by mouth daily.  Marland Kitchen Dextran 70-Hypromellose (ARTIFICIAL TEARS) 0.1-0.3 % SOLN Apply 1-2 drops to eye daily.  Marland Kitchen diltiazem (CARDIZEM CD) 120 MG 24 hr capsule Take 120 mg by mouth at bedtime.   Marland Kitchen diltiazem (TIAZAC) 120 MG 24 hr capsule Take 1 capsule by mouth daily.  . diphenhydrAMINE (BENADRYL) 25 MG tablet Take 25-50 mg by mouth daily as needed for itching.  . docusate sodium (COLACE) 100 MG capsule Take 200 mg by mouth at bedtime as needed for moderate constipation.   Marland Kitchen ELDERBERRY PO Take 1 tablet by mouth 2 (two) times daily.  Marland Kitchen EPINEPHrine (EPIPEN 2-PAK) 0.3 mg/0.3 mL IJ SOAJ injection use as directed for severe allergy reaction  . estradiol (ESTRACE VAGINAL) 0.1 MG/GM vaginal cream Apply a pea-sized amount to urethra at bedtime  . fenofibrate 160 MG tablet take1 tab po daily for chol  . fluticasone (FLONASE) 50 MCG/ACT nasal spray Use 2 sprays in each nostril daily,  . folic acid (FOLVITE) 595 MCG tablet Take 400 mcg by mouth daily.  Marland Kitchen guaiFENesin (MUCINEX) 600 MG 12 hr tablet Take 600 mg by mouth at bedtime as needed (congestion).  . Hypromellose (ARTIFICIAL TEARS OP) Place 1-2 drops into both eyes daily as needed (dry eyes).  Marland Kitchen ketotifen (ZADITOR) 0.025 % ophthalmic solution Place 1 drop into both eyes 2 (two)  times daily as needed (allergies).  Marland Kitchen levocetirizine (XYZAL) 5 MG tablet Take 1 tablet (5 mg total) by mouth every evening.  Marland Kitchen levothyroxine (SYNTHROID, LEVOTHROID) 50 MCG tablet Take 50 mcg by mouth daily before breakfast. Brand Name only  . Magnesium 250 MG TABS Take 1 tablet by mouth daily.  . Melatonin 5 MG TABS Take 1 tablet by mouth at bedtime. For sleep   . Multiple Vitamins-Minerals (MULTIVITAMIN WITH MINERALS) tablet Take 1 tablet by mouth daily.  . pantoprazole (PROTONIX) 40 MG tablet Take 1 tablet (40 mg total) by mouth 2 (two) times daily.  Marland Kitchen Phenylephrine-guaiFENesin 10-400 MG TABS Take 1  tablet by mouth every 4 (four) hours as needed (congestion).  . pyridoxine (B-6) 100 MG tablet Take 100 mg by mouth daily.  . sodium chloride (OCEAN) 0.65 % SOLN nasal spray Place 1 spray into both nostrils 2 (two) times daily.  Marland Kitchen Specialty Vitamins Products (ECHINACEA C COMPLETE PO) Take by mouth.  . Vitamin A 7.5 MG (25000 UT) CAPS Take 1 tablet by mouth 2 (two) times daily.  Marland Kitchen zinc gluconate 50 MG tablet Take 50 mg by mouth daily.  . [DISCONTINUED] citalopram (CELEXA) 10 MG tablet Take 1 tablet (10 mg total) by mouth daily.  . [DISCONTINUED] fluticasone (FLONASE) 50 MCG/ACT nasal spray 2 sprays in each nostril twice a day as needed.  . diazepam (VALIUM) 5 MG tablet Take 1 tablet po one time two hours prior to procedure. May repeat dose prior to leaving home for procedure if symptoms are persistent. Patient must have driver to and from procedure.  . diclofenac Sodium (VOLTAREN) 1 % GEL Apply 4 g topically 4 (four) times daily.  . sucralfate (CARAFATE) 1 GM/10ML suspension Take 10 mLs (1 g total) by mouth 3 (three) times daily before meals.  . [DISCONTINUED] amoxicillin-clavulanate (AUGMENTIN) 875-125 MG tablet Take 1 tablet by mouth 2 (two) times daily.  . [DISCONTINUED] umeclidinium-vilanterol (ANORO ELLIPTA) 62.5-25 MCG/INH AEPB Inhale 1 puff into the lungs daily.   No facility-administered encounter medications on file as of 04/09/2020.    Surgical History: Past Surgical History:  Procedure Laterality Date  . 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 BIOPSY THYROID  2018  . bladder biopsies  2003   9 biopsies done by dr. cope.  all benign.  inflammatory process going on in bladder  . BREAST BIOPSY Right    benign  . BREAST SURGERY Right    lumpectomy  . CATARACT EXTRACTION W/PHACO Left 12/25/2014   Procedure: CATARACT EXTRACTION PHACO AND INTRAOCULAR LENS PLACEMENT (IOC);  Surgeon: Birder Robson, MD;  Location: [facility] ORS;   Service: Ophthalmology;  Laterality: Left;  [place] 00:32 AP% 20.0 CDE 6.49  . COLONOSCOPY W/ BIOPSIES    . COLONOSCOPY W/ POLYPECTOMY  2002, 2004, 2005   adenomatous polyps removed  . COLONOSCOPY WITH PROPOFOL N/A 08/23/2017   Procedure: COLONOSCOPY WITH PROPOFOL;  Surgeon: Manya Silvas, MD;  Location: [facility] ENDOSCOPY;  Service: Endoscopy;  Laterality: N/A;  . COLONOSCOPY WITH PROPOFOL N/A 11/08/2017   Procedure: COLONOSCOPY WITH PROPOFOL;  Surgeon: Manya Silvas, MD;  Location: [facility] ENDOSCOPY;  Service: Endoscopy;  Laterality: N/A;  . CYSTOCELE REPAIR N/A 04/09/2016   Procedure: ANTERIOR REPAIR (CYSTOCELE);  Surgeon: Gae Dry, MD;  Location: [facility] ORS;  Service: Gynecology;  Laterality: N/A;  . DIAGNOSTIC LAPAROSCOPY  2008   removed both ovaries with cysts, tubes and fibroids  .  DILATION AND CURETTAGE OF UTERUS  1973  . ESOPHAGOGASTRODUODENOSCOPY (EGD) WITH PROPOFOL N/A 08/23/2017   Procedure: ESOPHAGOGASTRODUODENOSCOPY (EGD) WITH PROPOFOL;  Surgeon: Manya Silvas, MD;  Location: [facility] ENDOSCOPY;  Service: Endoscopy;  Laterality: N/A;  . EYE SURGERY Bilateral 2016  . HERNIA REPAIR Right 2008   Inguinal hernia Repair, ventral hernia repair  . JOINT REPLACEMENT Right 2008   knee  . KNEE ARTHROSCOPY Right   . LUMBAR LAMINECTOMY/DECOMPRESSION MICRODISCECTOMY Right 03/21/2013   Procedure: Right Lumbar five-Sacral one Laminectomy for Synovial Cyst;  Surgeon: Faythe Ghee, MD;  Location: [facility] NEURO ORS;  Service: Neurosurgery;  Laterality: Right;  right  . OOPHORECTOMY    . REVERSE SHOULDER ARTHROPLASTY Right 02/22/2018   Procedure: REVERSE SHOULDER ARTHROPLASTY;  Surgeon: Corky Mull, MD;  Location: [facility] ORS;  Service: Orthopedics;  Laterality: Right;  . TUBAL LIGATION    . UNILATERAL SALPINGECTOMY Left 04/09/2016   Procedure: UNILATERAL SALPINGECTOMY;  Surgeon: Gae Dry, MD;  Location: [facility] ORS;  Service: Gynecology;  Laterality: Left;  . UPPER GI ENDOSCOPY  2010   with  biopsy of gastric erosion  . VAGINAL HYSTERECTOMY N/A 04/09/2016   Procedure: HYSTERECTOMY VAGINAL;  Surgeon: Gae Dry, MD;  Location: [facility] ORS;  Service: Gynecology;  Laterality: N/A;  . VAGINAL HYSTERECTOMY  2017   and bladder tack    Medical History: Past Medical History:  Diagnosis Date  . Arthritis    osteo  . Asthma    needs rescue inhaler few times a year  . Claustrophobia 04/21/2020  . Depression   . Dizziness    light headed spells but it has been a while  . Dysrhythmia    tachycardia.(cardizem). brady during procedures  . GERD (gastroesophageal reflux disease)    gastritis  . Headache(784.0)   . Heart murmur 2019   aortic. dr. Nehemiah Massed not worried about this  . Hyperlipidemia   . Hypertension   . Hypothyroidism   . Migraine   . Orthopnea   . Pneumonia    long time ago (greater than 5 years ago)  . PONV (postoperative nausea and vomiting)    very anxious during cataract surgery. brady during colonoscopy  . Shortness of breath    any exertion, cannot lie flat    Family History: Family History  Problem Relation Age of Onset  . Pulmonary fibrosis Mother   . Melanoma Father   . Cardiomyopathy Sister        and arrhythmia  . Breast cancer Cousin        paternal 1st    Social History   Socioeconomic History  . Marital status: Married    Spouse name: Not on file  . Number of children: Not on file  . Years of education: Not on file  . Highest education level: Not on file  Occupational History  . Not on file  Tobacco Use  . Smoking status: Former Smoker    Types: Cigarettes    Quit date: 1967    Years since quitting: 54.7  . Smokeless tobacco: Never Used  Vaping Use  . Vaping Use: Never used  Substance and Sexual Activity  . Alcohol use: Not Currently  . Drug use: No  . Sexual activity: Not Currently  Other Topics Concern  . Not on file  Social History Narrative  . Not on file   Social Determinants of Health   Financial Resource  Strain:   . Difficulty of Paying Living Expenses: Not on file  Food Insecurity:   .  Worried About Charity fundraiser in the Last Year: Not on file  . Ran Out of Food in the Last Year: Not on file  Transportation Needs:   . Lack of Transportation (Medical): Not on file  . Lack of Transportation (Non-Medical): Not on file  Physical Activity:   . Days of Exercise per Week: Not on file  . Minutes of Exercise per Session: Not on file  Stress:   . Feeling of Stress : Not on file  Social Connections:   . Frequency of Communication with Friends and Family: Not on file  . Frequency of Social Gatherings with Friends and Family: Not on file  . Attends Religious Services: Not on file  . Active Member of Clubs or Organizations: Not on file  . Attends Archivist Meetings: Not on file  . Marital Status: Not on file  Intimate Partner Violence:   . Fear of Current or Ex-Partner: Not on file  . Emotionally Abused: Not on file  . Physically Abused: Not on file  . Sexually Abused: Not on file      Review of Systems  Constitutional: Positive for fatigue. Negative for chills and unexpected weight change.       Fatigue continues to be moderate to severe.   HENT: Negative for congestion, rhinorrhea, sinus pressure, sneezing and sore throat.   Respiratory: Positive for shortness of breath. Negative for cough, chest tightness and wheezing.        Shortness of breath present with exertion.   Cardiovascular: Positive for leg swelling. Negative for chest pain and palpitations.       Improved leg pain and swelling of bilateral lower legs.   Gastrointestinal: Positive for constipation. Negative for abdominal pain, diarrhea, nausea and vomiting.       Increased GERD type symptoms.   Endocrine: Negative for cold intolerance, heat intolerance, polydipsia and polyuria.  Genitourinary: Positive for difficulty urinating and dysuria. Negative for hematuria and urgency.  Musculoskeletal: Positive for  arthralgias and myalgias. Negative for back pain, joint swelling and neck pain.       Generalized joint pain  Skin: Negative for rash.  Allergic/Immunologic: Negative for environmental allergies.  Neurological: Positive for dizziness, weakness and headaches. Negative for tremors and numbness.  Hematological: Negative for adenopathy. Does not bruise/bleed easily.  Psychiatric/Behavioral: Positive for dysphoric mood. Negative for behavioral problems and sleep disturbance. The patient is not nervous/anxious.     Today's Vitals   04/09/20 1144  BP: (!) 142/70  Pulse: 99  Resp: 16  Temp: 97.6 F (36.4 C)  SpO2: 97%  Weight: 151 lb (68.5 kg)  Height: 5\' 1"  (1.549 m)   Body mass index is 28.53 kg/m.  Physical Exam Vitals and nursing note reviewed.  Constitutional:      General: She is not in acute distress.    Appearance: Normal appearance. She is well-developed. She is not diaphoretic.  HENT:     Head: Normocephalic and atraumatic.     Nose: Nose normal.     Mouth/Throat:     Pharynx: No oropharyngeal exudate.  Eyes:     Pupils: Pupils are equal, round, and reactive to light.  Neck:     Thyroid: No thyromegaly.     Vascular: No JVD.     Trachea: No tracheal deviation.  Cardiovascular:     Rate and Rhythm: Normal rate and regular rhythm.     Heart sounds: Normal heart sounds. No murmur heard.  No friction rub. No gallop.  Pulmonary:     Effort: Pulmonary effort is normal. No respiratory distress.     Breath sounds: Normal breath sounds. No wheezing or rales.  Chest:     Chest wall: No tenderness.  Abdominal:     General: Bowel sounds are normal.     Palpations: Abdomen is soft.     Tenderness: There is abdominal tenderness.     Comments: Mild generalized abdominal pain with light to medium palpation.   Genitourinary:    Comments: Urine sample is negative for infection or other abnormalities. Musculoskeletal:        General: Normal range of motion.     Cervical back:  Normal range of motion and neck supple.     Comments: There is generalized joint pain as well as moderate mid back pain. No point tenderness present. No acute abnormalities or deformities which are appreciated today.   Lymphadenopathy:     Cervical: No cervical adenopathy.  Skin:    General: Skin is warm and dry.  Neurological:     Mental Status: She is alert and oriented to person, place, and time. Mental status is at baseline.     Cranial Nerves: No cranial nerve deficit.  Psychiatric:        Attention and Perception: Attention and perception normal.        Mood and Affect: Mood is depressed.        Speech: Speech normal.        Behavior: Behavior normal.        Thought Content: Thought content normal.        Cognition and Memory: Cognition and memory normal.        Judgment: Judgment normal.     Assessment/Plan: 1. Benign essential HTN Blood pressure is generally stable. Continue all bp medication as prescribed.   2. Mild intermittent asthma without complication Stable. Continue inhalers and respiratory medication as prescribd   3. Peptic ulcer disease Add in careafate 1gm up to four times daily as needed for epigastric/GERD pain. Follow up with GI provider as scheduled.  - sucralfate (CARAFATE) 1 GM/10ML suspension; Take 10 mLs (1 g total) by mouth 3 (three) times daily before meals.  Dispense: 420 mL; Refill: 2  4. Inflammatory polyarthritis ([place]) May apply voltaren gel to all effected areas up to four times daily if needed for pain.  - diclofenac Sodium (VOLTAREN) 1 % GEL; Apply 4 g topically 4 (four) times daily.  Dispense: 500 g; Refill: 3  5. Claustrophobia Patient scheduled for upcoming MRI of lumbar spine. May take diazepam as needed and as prescribed. Prescription for #2 tablets was sent to her pharmacy. Advised her to have a driver to take her two and from her MRI. She voiced understanding and agreement with this plan.  - diazepam (VALIUM) 5 MG tablet; Take 1 tablet  po one time two hours prior to procedure. May repeat dose prior to leaving home for procedure if symptoms are persistent. Patient must have driver to and from procedure.  Dispense: 2 tablet; Refill: 0  6. Allergic rhinitis due to pollen, unspecified seasonality Use flonase nasal spray as prescribed  - fluticasone (FLONASE) 50 MCG/ACT nasal spray; Use 2 sprays in each nostril daily,  Dispense: 16 g; Refill: 0  7. Moderate episode of recurrent major depressive disorder (HCC) Stable. Continue to take celexa as prescribed. Refills provided today.  - citalopram (CELEXA) 10 MG tablet; Take 1 tablet (10 mg total) by mouth daily.  Dispense: 90 tablet; Refill: 1  8. Dysuria - POCT Urinalysis Dipstick negative for infection or other abnormalities at this time.   General Counseling: Vanessa Evans verbalizes understanding of the findings of todays visit and agrees with plan of treatment. I have discussed any further diagnostic evaluation that may be needed or ordered today. We also reviewed her medications today. she has been encouraged to call the office with any questions or concerns that should arise related to todays visit.  This patient was seen by Leretha Pol FNP Collaboration with Dr Lavera Guise as a part of collaborative care agreement  Orders Placed This Encounter  Procedures  . POCT Urinalysis Dipstick    Meds ordered this encounter  Medications  . citalopram (CELEXA) 10 MG tablet    Sig: Take 1 tablet (10 mg total) by mouth daily.    Dispense:  90 tablet    Refill:  1    Wean off duloxetine. Causing severe constipation.    Order Specific Question:   Supervising Provider    Answer:   Lavera Guise [[id number]]  . fluticasone (FLONASE) 50 MCG/ACT nasal spray    Sig: Use 2 sprays in each nostril daily,    Dispense:  16 g    Refill:  0    Order Specific Question:   Supervising Provider    Answer:   Lavera Guise [place]  . diclofenac Sodium (VOLTAREN) 1 % GEL    Sig: Apply 4 g topically 4  (four) times daily.    Dispense:  500 g    Refill:  3    Order Specific Question:   Supervising Provider    Answer:   Lavera Guise [[id number]]  . sucralfate (CARAFATE) 1 GM/10ML suspension    Sig: Take 10 mLs (1 g total) by mouth 3 (three) times daily before meals.    Dispense:  420 mL    Refill:  2    Order Specific Question:   Supervising Provider    Answer:   Lavera Guise [[id number]]  . diazepam (VALIUM) 5 MG tablet    Sig: Take 1 tablet po one time two hours prior to procedure. May repeat dose prior to leaving home for procedure if symptoms are persistent. Patient must have driver to and from procedure.    Dispense:  2 tablet    Refill:  0    Order Specific Question:   Supervising Provider    Answer:   Lavera Guise [[id number]]    Total time spent: 30 Minutes   Time spent includes review of chart, medications, test results, and follow up plan with the patient.      Dr Lavera Guise Internal medicine

## 2020-04-11 ENCOUNTER — Ambulatory Visit
Admission: RE | Admit: 2020-04-11 | Discharge: 2020-04-11 | Disposition: A | Discharge status: Home / Self Care | Payer: Medicare Other | Source: Ambulatory Visit | Attending: Nurse Practitioner | Admitting: Nurse Practitioner

## 2020-04-11 ENCOUNTER — Other Ambulatory Visit: Payer: Self-pay

## 2020-04-11 DIAGNOSIS — G8929 Other chronic pain: Secondary | ICD-10-CM | POA: Insufficient documentation

## 2020-04-11 DIAGNOSIS — M5442 Lumbago with sciatica, left side: Secondary | ICD-10-CM | POA: Insufficient documentation

## 2020-04-11 DIAGNOSIS — M5416 Radiculopathy, lumbar region: Secondary | ICD-10-CM | POA: Diagnosis not present

## 2020-04-11 DIAGNOSIS — M5441 Lumbago with sciatica, right side: Secondary | ICD-10-CM | POA: Diagnosis not present

## 2020-04-11 DIAGNOSIS — M545 Low back pain: Secondary | ICD-10-CM | POA: Diagnosis not present

## 2020-04-12 DIAGNOSIS — E039 Hypothyroidism, unspecified: Secondary | ICD-10-CM | POA: Diagnosis not present

## 2020-04-12 DIAGNOSIS — R739 Hyperglycemia, unspecified: Secondary | ICD-10-CM | POA: Diagnosis not present

## 2020-04-16 DIAGNOSIS — I10 Essential (primary) hypertension: Secondary | ICD-10-CM | POA: Diagnosis not present

## 2020-04-16 DIAGNOSIS — I700 Atherosclerosis of aorta: Secondary | ICD-10-CM | POA: Diagnosis not present

## 2020-04-16 DIAGNOSIS — M47816 Spondylosis without myelopathy or radiculopathy, lumbar region: Secondary | ICD-10-CM | POA: Diagnosis not present

## 2020-04-16 DIAGNOSIS — M5136 Other intervertebral disc degeneration, lumbar region: Secondary | ICD-10-CM | POA: Diagnosis not present

## 2020-04-16 DIAGNOSIS — R002 Palpitations: Secondary | ICD-10-CM | POA: Diagnosis not present

## 2020-04-16 DIAGNOSIS — E782 Mixed hyperlipidemia: Secondary | ICD-10-CM | POA: Diagnosis not present

## 2020-04-16 DIAGNOSIS — Z23 Encounter for immunization: Secondary | ICD-10-CM | POA: Diagnosis not present

## 2020-04-18 ENCOUNTER — Other Ambulatory Visit: Payer: Self-pay

## 2020-04-18 ENCOUNTER — Ambulatory Visit (INDEPENDENT_AMBULATORY_CARE_PROVIDER_SITE_OTHER): Payer: Medicare Other | Admitting: Internal Medicine

## 2020-04-18 ENCOUNTER — Encounter: Payer: Self-pay | Admitting: Internal Medicine

## 2020-04-18 VITALS — BP 140/85 | HR 72 | Resp 16 | Ht 60.03 in | Wt 152.0 lb

## 2020-04-18 DIAGNOSIS — R0602 Shortness of breath: Secondary | ICD-10-CM | POA: Diagnosis not present

## 2020-04-18 DIAGNOSIS — J4520 Mild intermittent asthma, uncomplicated: Secondary | ICD-10-CM | POA: Diagnosis not present

## 2020-04-18 DIAGNOSIS — K219 Gastro-esophageal reflux disease without esophagitis: Secondary | ICD-10-CM | POA: Diagnosis not present

## 2020-04-18 DIAGNOSIS — I471 Supraventricular tachycardia: Secondary | ICD-10-CM

## 2020-04-18 NOTE — Progress Notes (Signed)
[facility] [place], [place] [address]  Pulmonary Sleep Medicine   Office Visit Note  Patient Name: Vanessa Evans DOB: Feb 14, 1946 MRN [id number]  Date of Service: 04/18/2020  Complaints/HPI: She is doing better. She states that she is using her oxygen at noght. Patient states she has been vaccinated. She has no cough no congestion noted. Denies having fevers. She is on oxygen at this time. She states that she feels it is helping  ROS  General: (-) fever, (-) chills, (-) night sweats, (-) weakness Skin: (-) rashes, (-) itching,. Eyes: (-) visual changes, (-) redness, (-) itching. Nose and Sinuses: (-) nasal stuffiness or itchiness, (-) postnasal drip, (-) nosebleeds, (-) sinus trouble. Mouth and Throat: (-) sore throat, (-) hoarseness. Neck: (-) swollen glands, (-) enlarged thyroid, (-) neck pain. Respiratory: - cough, (-) bloody sputum, + shortness of breath, - wheezing. Cardiovascular: + ankle swelling, (-) chest pain. Lymphatic: (-) lymph node enlargement. Neurologic: (-) numbness, (-) tingling. Psychiatric: (-) anxiety, (-) depression   Current Medication: Outpatient Encounter Medications as of 04/18/2020  Medication Sig  . acetaminophen (TYLENOL) 500 MG tablet Take 1,000 mg by mouth 2 (two) times daily as needed for moderate pain.  Marland Kitchen albuterol (PROVENTIL) (2.5 MG/3ML) 0.083% nebulizer solution Take 3 mLs (2.5 mg total) by nebulization every 6 (six) hours as needed for wheezing or shortness of breath.  Marland Kitchen albuterol (VENTOLIN HFA) 108 (90 Base) MCG/ACT inhaler Inhale 2 puffs into the lungs every 4 (four) hours as needed for wheezing or shortness of breath.  Marland Kitchen aspirin EC 81 MG tablet Take 81 mg by mouth at bedtime.   . benzonatate (TESSALON) 100 MG capsule Take 2 capsules (200 mg total) by mouth 2 (two) times daily as needed for cough.  Marland Kitchen BLACK ELDERBERRY PO Take 1 Dose by mouth 2 (two) times a day.  . Calcium Carbonate-Vitamin D (CALCIUM 600+D)  600-400 MG-UNIT tablet Take 1 tablet by mouth 2 (two) times daily.  . cholecalciferol (VITAMIN D) 1000 UNITS tablet Take 1,000 Units by mouth at bedtime.   . citalopram (CELEXA) 10 MG tablet Take 1 tablet (10 mg total) by mouth daily.  . cyanocobalamin 500 MCG tablet Take 500 mcg by mouth daily.  Marland Kitchen Dextran 70-Hypromellose (ARTIFICIAL TEARS) 0.1-0.3 % SOLN Apply 1-2 drops to eye daily.  . diazepam (VALIUM) 5 MG tablet Take 1 tablet po one time two hours prior to procedure. May repeat dose prior to leaving home for procedure if symptoms are persistent. Patient must have driver to and from procedure.  . diclofenac Sodium (VOLTAREN) 1 % GEL Apply 4 g topically 4 (four) times daily.  Marland Kitchen diltiazem (CARDIZEM CD) 120 MG 24 hr capsule Take 120 mg by mouth at bedtime.   Marland Kitchen diltiazem (TIAZAC) 120 MG 24 hr capsule Take 1 capsule by mouth daily.  . diphenhydrAMINE (BENADRYL) 25 MG tablet Take 25-50 mg by mouth daily as needed for itching.  . docusate sodium (COLACE) 100 MG capsule Take 200 mg by mouth at bedtime as needed for moderate constipation.   Marland Kitchen ELDERBERRY PO Take 1 tablet by mouth 2 (two) times daily.  Marland Kitchen EPINEPHrine (EPIPEN 2-PAK) 0.3 mg/0.3 mL IJ SOAJ injection use as directed for severe allergy reaction  . estradiol (ESTRACE VAGINAL) 0.1 MG/GM vaginal cream Apply a pea-sized amount to urethra at bedtime  . fenofibrate 160 MG tablet take1 tab po daily for chol  . fluticasone (FLONASE) 50 MCG/ACT nasal spray Use 2 sprays in each nostril daily,  .  folic acid (FOLVITE) 726 MCG tablet Take 400 mcg by mouth daily.  Marland Kitchen guaiFENesin (MUCINEX) 600 MG 12 hr tablet Take 600 mg by mouth at bedtime as needed (congestion).  . Hypromellose (ARTIFICIAL TEARS OP) Place 1-2 drops into both eyes daily as needed (dry eyes).  Marland Kitchen ketotifen (ZADITOR) 0.025 % ophthalmic solution Place 1 drop into both eyes 2 (two) times daily as needed (allergies).  Marland Kitchen levocetirizine (XYZAL) 5 MG tablet Take 1 tablet (5 mg total) by mouth every  evening.  Marland Kitchen levothyroxine (SYNTHROID, LEVOTHROID) 50 MCG tablet Take 50 mcg by mouth daily before breakfast. Brand Name only  . Magnesium 250 MG TABS Take 1 tablet by mouth daily.  . Melatonin 5 MG TABS Take 1 tablet by mouth at bedtime. For sleep   . Multiple Vitamins-Minerals (MULTIVITAMIN WITH MINERALS) tablet Take 1 tablet by mouth daily.  . OXYGEN Inhale into the lungs. 2 litre at night  . pantoprazole (PROTONIX) 40 MG tablet Take 1 tablet (40 mg total) by mouth 2 (two) times daily.  Marland Kitchen Phenylephrine-guaiFENesin 10-400 MG TABS Take 1 tablet by mouth every 4 (four) hours as needed (congestion).  . pyridoxine (B-6) 100 MG tablet Take 100 mg by mouth daily.  . sodium chloride (OCEAN) 0.65 % SOLN nasal spray Place 1 spray into both nostrils 2 (two) times daily.  Marland Kitchen Specialty Vitamins Products (ECHINACEA C COMPLETE PO) Take by mouth.  . sucralfate (CARAFATE) 1 GM/10ML suspension Take 10 mLs (1 g total) by mouth 3 (three) times daily before meals.  . Vitamin A 7.5 MG (25000 UT) CAPS Take 1 tablet by mouth 2 (two) times daily.  Marland Kitchen zinc gluconate 50 MG tablet Take 50 mg by mouth daily.   No facility-administered encounter medications on file as of 04/18/2020.    Surgical History: Past Surgical History:  Procedure Laterality Date  . 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 BIOPSY THYROID  2018  . bladder biopsies  2003   9 biopsies done by dr. cope.  all benign.  inflammatory process going on in bladder  . BREAST BIOPSY Right    benign  . BREAST SURGERY Right    lumpectomy  . CATARACT EXTRACTION W/PHACO Left 12/25/2014   Procedure: CATARACT EXTRACTION PHACO AND INTRAOCULAR LENS PLACEMENT (IOC);  Surgeon: Birder Robson, MD;  Location: [facility] ORS;  Service: Ophthalmology;  Laterality: Left;  [place] 00:32 AP% 20.0 CDE 6.49  . COLONOSCOPY W/ BIOPSIES    . COLONOSCOPY W/ POLYPECTOMY  2002, 2004, 2005   adenomatous polyps removed  . COLONOSCOPY  WITH PROPOFOL N/A 08/23/2017   Procedure: COLONOSCOPY WITH PROPOFOL;  Surgeon: Manya Silvas, MD;  Location: [facility] ENDOSCOPY;  Service: Endoscopy;  Laterality: N/A;  . COLONOSCOPY WITH PROPOFOL N/A 11/08/2017   Procedure: COLONOSCOPY WITH PROPOFOL;  Surgeon: Manya Silvas, MD;  Location: [facility] ENDOSCOPY;  Service: Endoscopy;  Laterality: N/A;  . CYSTOCELE REPAIR N/A 04/09/2016   Procedure: ANTERIOR REPAIR (CYSTOCELE);  Surgeon: Gae Dry, MD;  Location: [facility] ORS;  Service: Gynecology;  Laterality: N/A;  . DIAGNOSTIC LAPAROSCOPY  2008   removed both ovaries with cysts, tubes and fibroids  . DILATION AND CURETTAGE OF UTERUS  1973  . ESOPHAGOGASTRODUODENOSCOPY (EGD) WITH PROPOFOL N/A 08/23/2017   Procedure: ESOPHAGOGASTRODUODENOSCOPY (EGD) WITH PROPOFOL;  Surgeon: Manya Silvas, MD;  Location: [facility] ENDOSCOPY;  Service: Endoscopy;  Laterality: N/A;  . EYE SURGERY Bilateral 2016  . HERNIA REPAIR  Right 2008   Inguinal hernia Repair, ventral hernia repair  . JOINT REPLACEMENT Right 2008   knee  . KNEE ARTHROSCOPY Right   . LUMBAR LAMINECTOMY/DECOMPRESSION MICRODISCECTOMY Right 03/21/2013   Procedure: Right Lumbar five-Sacral one Laminectomy for Synovial Cyst;  Surgeon: Faythe Ghee, MD;  Location: [facility] NEURO ORS;  Service: Neurosurgery;  Laterality: Right;  right  . OOPHORECTOMY    . REVERSE SHOULDER ARTHROPLASTY Right 02/22/2018   Procedure: REVERSE SHOULDER ARTHROPLASTY;  Surgeon: Corky Mull, MD;  Location: [facility] ORS;  Service: Orthopedics;  Laterality: Right;  . TUBAL LIGATION    . UNILATERAL SALPINGECTOMY Left 04/09/2016   Procedure: UNILATERAL SALPINGECTOMY;  Surgeon: Gae Dry, MD;  Location: [facility] ORS;  Service: Gynecology;  Laterality: Left;  . UPPER GI ENDOSCOPY  2010   with biopsy of gastric erosion  . VAGINAL HYSTERECTOMY N/A 04/09/2016   Procedure: HYSTERECTOMY VAGINAL;  Surgeon: Gae Dry, MD;  Location: [facility] ORS;  Service: Gynecology;  Laterality: N/A;  .  VAGINAL HYSTERECTOMY  2017   and bladder tack    Medical History: Past Medical History:  Diagnosis Date  . Arthritis    osteo  . Asthma    needs rescue inhaler few times a year  . Depression   . Dizziness    light headed spells but it has been a while  . Dysrhythmia    tachycardia.(cardizem). brady during procedures  . GERD (gastroesophageal reflux disease)    gastritis  . Headache(784.0)   . Heart murmur 2019   aortic. dr. Nehemiah Massed not worried about this  . Hyperlipidemia   . Hypertension   . Hypothyroidism   . Migraine   . Orthopnea   . Pneumonia    long time ago (greater than 5 years ago)  . PONV (postoperative nausea and vomiting)    very anxious during cataract surgery. brady during colonoscopy  . Shortness of breath    any exertion, cannot lie flat    Family History: Family History  Problem Relation Age of Onset  . Pulmonary fibrosis Mother   . Melanoma Father   . Cardiomyopathy Sister        and arrhythmia  . Breast cancer Cousin        paternal 1st    Social History: Social History   Socioeconomic History  . Marital status: Married    Spouse name: Not on file  . Number of children: Not on file  . Years of education: Not on file  . Highest education level: Not on file  Occupational History  . Not on file  Tobacco Use  . Smoking status: Former Smoker    Types: Cigarettes    Quit date: 1967    Years since quitting: 54.7  . Smokeless tobacco: Never Used  Vaping Use  . Vaping Use: Never used  Substance and Sexual Activity  . Alcohol use: Not Currently  . Drug use: No  . Sexual activity: Not Currently  Other Topics Concern  . Not on file  Social History Narrative  . Not on file   Social Determinants of Health   Financial Resource Strain:   . Difficulty of Paying Living Expenses: Not on file  Food Insecurity:   . Worried About Charity fundraiser in the Last Year: Not on file  . Ran Out of Food in the Last Year: Not on file   Transportation Needs:   . Lack of Transportation (Medical): Not on file  . Lack of Transportation (Non-Medical): Not on  file  Physical Activity:   . Days of Exercise per Week: Not on file  . Minutes of Exercise per Session: Not on file  Stress:   . Feeling of Stress : Not on file  Social Connections:   . Frequency of Communication with Friends and Family: Not on file  . Frequency of Social Gatherings with Friends and Family: Not on file  . Attends Religious Services: Not on file  . Active Member of Clubs or Organizations: Not on file  . Attends Archivist Meetings: Not on file  . Marital Status: Not on file  Intimate Partner Violence:   . Fear of Current or Ex-Partner: Not on file  . Emotionally Abused: Not on file  . Physically Abused: Not on file  . Sexually Abused: Not on file    Vital Signs: Blood pressure 140/85, pulse 72, resp. rate 16, height 5' 0.03" (1.525 m), weight 152 lb (68.9 kg), SpO2 97 %.  Examination: General Appearance: The patient is well-developed, well-nourished, and in no distress. Skin: Gross inspection of skin unremarkable. Head: normocephalic, no gross deformities. Eyes: no gross deformities noted. ENT: ears appear grossly normal no exudates. Neck: Supple. No thyromegaly. No LAD. Respiratory: no rhonchi noted. Cardiovascular: Normal S1 and S2 without murmur or rub. Extremities: No cyanosis. pulses are equal. Neurologic: Alert and oriented. No involuntary movements.  LABS: Recent Results (from the past 2160 hour(s))  POCT Urinalysis Dipstick     Status: Abnormal   Collection Time: 01/23/20  3:45 PM  Result Value Ref Range   Color, UA     Clarity, UA     Glucose, UA Negative Negative   Bilirubin, UA negative    Ketones, UA negative    Spec Grav, UA 1.010 1.010 - 1.025   Blood, UA negative    pH, UA 5.0 5.0 - 8.0   Protein, UA Negative Negative   Urobilinogen, UA 0.2 0.2 or 1.0 E.U./dL   Nitrite, UA negative    Leukocytes, UA  Moderate (2+) (A) Negative   Appearance     Odor    CULTURE, URINE COMPREHENSIVE     Status: None   Collection Time: 01/23/20  3:48 PM   Specimen: Urine   Urine  Result Value Ref Range   Urine Culture, Comprehensive Final report    Organism ID, Bacteria Comment     Comment: Mixed urogenital flora 10,000-25,000 colony forming units per mL   POCT Urinalysis Dipstick     Status: None   Collection Time: 04/09/20 12:01 PM  Result Value Ref Range   Color, UA     Clarity, UA     Glucose, UA Negative Negative   Bilirubin, UA negative    Ketones, UA negative    Spec Grav, UA 1.010 1.010 - 1.025   Blood, UA negative    pH, UA 7.0 5.0 - 8.0   Protein, UA Negative Negative   Urobilinogen, UA 0.2 0.2 or 1.0 E.U./dL   Nitrite, UA negative    Leukocytes, UA Negative Negative   Appearance     Odor      Radiology: MR LUMBAR SPINE WO CONTRAST  Result Date: 04/11/2020 CLINICAL DATA:  Chronic low back pain. Occasional pain into the right buttock. EXAM: MRI LUMBAR SPINE WITHOUT CONTRAST TECHNIQUE: Multiplanar, multisequence MR imaging of the lumbar spine was performed. No intravenous contrast was administered. COMPARISON:  MRI of the lumbar spine 09/30/2018 FINDINGS: Segmentation: 5 non rib-bearing lumbar type vertebral bodies are present. The lowest fully formed vertebral  body is L5. Alignment: 8 mm anterolisthesis at L5-S1 is stable secondary to right-sided pars defect advanced facet hypertrophy on the left. No other significant listhesis is present. There is some straightening of the normal lumbar lordosis. Vertebrae: Chronic fatty endplate marrow changes are again noted at L3-4. Marrow signal and vertebral body heights are otherwise normal. Conus medullaris and cauda equina: Conus extends to the T12-L1 level. Conus and cauda equina appear normal. Paraspinal and other soft tissues: Limited imaging the abdomen is unremarkable. There is no significant adenopathy. No solid organ lesions are present.  Disc levels: T12-L1: Negative. L1-2: Negative. L2-3: Disc bulging and mild bilateral facet hypertrophy is present. New significant stenosis is present. L3-4: A broad-based disc protrusion is present. Moderate facet hypertrophy is noted. Moderate right and mild left foraminal narrowing is present. Mild right subarticular narrowing is present. L4-5: A broad-based disc protrusion is present. Moderate facet hypertrophy and ligamentum flavum thickening is noted. Mild bilateral foraminal stenosis is present. L5-S1: Moderate facet hypertrophy is present bilaterally. Dilated perineural root sleeve cyst is present on the right. Right laminectomy is noted. No significant central canal stenosis is present. Foramina are patent bilaterally. IMPRESSION: 1. Stable anterolisthesis at L5-S1 secondary to right-sided pars defect in advanced facet hypertrophy on the left. 2. Mild bilateral foraminal narrowing at L4-5. 3. Moderate right and mild left foraminal stenosis at L3-4 with mild right subarticular narrowing. Chronic endplate marrow changes are present at this level. 4. Disc bulging and mild bilateral facet hypertrophy at L2-3 without significant stenosis. Electronically Signed   By: San Morelle M.D.   On: 04/11/2020 22:31    No results found.  MR LUMBAR SPINE WO CONTRAST  Result Date: 04/11/2020 CLINICAL DATA:  Chronic low back pain. Occasional pain into the right buttock. EXAM: MRI LUMBAR SPINE WITHOUT CONTRAST TECHNIQUE: Multiplanar, multisequence MR imaging of the lumbar spine was performed. No intravenous contrast was administered. COMPARISON:  MRI of the lumbar spine 09/30/2018 FINDINGS: Segmentation: 5 non rib-bearing lumbar type vertebral bodies are present. The lowest fully formed vertebral body is L5. Alignment: 8 mm anterolisthesis at L5-S1 is stable secondary to right-sided pars defect advanced facet hypertrophy on the left. No other significant listhesis is present. There is some straightening of the  normal lumbar lordosis. Vertebrae: Chronic fatty endplate marrow changes are again noted at L3-4. Marrow signal and vertebral body heights are otherwise normal. Conus medullaris and cauda equina: Conus extends to the T12-L1 level. Conus and cauda equina appear normal. Paraspinal and other soft tissues: Limited imaging the abdomen is unremarkable. There is no significant adenopathy. No solid organ lesions are present. Disc levels: T12-L1: Negative. L1-2: Negative. L2-3: Disc bulging and mild bilateral facet hypertrophy is present. New significant stenosis is present. L3-4: A broad-based disc protrusion is present. Moderate facet hypertrophy is noted. Moderate right and mild left foraminal narrowing is present. Mild right subarticular narrowing is present. L4-5: A broad-based disc protrusion is present. Moderate facet hypertrophy and ligamentum flavum thickening is noted. Mild bilateral foraminal stenosis is present. L5-S1: Moderate facet hypertrophy is present bilaterally. Dilated perineural root sleeve cyst is present on the right. Right laminectomy is noted. No significant central canal stenosis is present. Foramina are patent bilaterally. IMPRESSION: 1. Stable anterolisthesis at L5-S1 secondary to right-sided pars defect in advanced facet hypertrophy on the left. 2. Mild bilateral foraminal narrowing at L4-5. 3. Moderate right and mild left foraminal stenosis at L3-4 with mild right subarticular narrowing. Chronic endplate marrow changes are present at this level. 4. Disc  bulging and mild bilateral facet hypertrophy at L2-3 without significant stenosis. Electronically Signed   By: San Morelle M.D.   On: 04/11/2020 22:31      Assessment and Plan: Patient Active Problem List   Diagnosis Date Noted  . Urinary tract infection without hematuria 02/04/2020  . Pain in both lower extremities 12/18/2019  . Inflammatory polyarthritis ([place]) 07/26/2019  . Excessive daytime sleepiness 07/26/2019  .  Encounter for screening mammogram for malignant neoplasm of breast 07/26/2019  . Intercostal muscle pain 06/25/2019  . Positive ANA (antinuclear antibody) 05/01/2019  . Primary osteoarthritis involving multiple joints 04/26/2019  . Mild intermittent asthma without complication 62/83/6629  . Seasonal allergic rhinitis due to pollen 08/22/2018  . Chills with fever 08/11/2018  . Sore throat 08/11/2018  . Acute upper respiratory infection 08/11/2018  . Candidiasis 08/11/2018  . Iron deficiency anemia 05/05/2018  . Right hip pain 04/27/2018  . Accidental fall from furniture 04/27/2018  . Vitamin D deficiency 04/27/2018  . Cough 04/18/2018  . Dysuria 04/18/2018  . Status post reverse total shoulder replacement, right 02/22/2018  . Abnormal ECG 02/18/2018  . Pain in joint of right shoulder 02/13/2018  . Abdominal aortic atherosclerosis ([place]) 02/13/2018  . Episode of recurrent major depressive disorder ([place]) 02/13/2018  . Encounter for general adult medical examination with abnormal findings 02/13/2018  . Lipoma of right shoulder 01/31/2018  . Rotator cuff tendinitis, right 01/31/2018  . Chronic superficial gastritis without bleeding 12/12/2017  . Schatzki's ring 12/12/2017  . Acute gastritis 11/08/2017  . Nausea 10/18/2017  . Other fatigue 10/18/2017  . Allergic rhinitis 09/20/2017  . Eczema 09/20/2017  . Hematuria 09/20/2017  . Mixed hyperlipidemia 09/20/2017  . Osteoarthritis 09/20/2017  . Osteoporosis, post-menopausal 09/20/2017  . Palpitations 07/05/2017  . Epigastric pain 05/24/2017  . Gastroesophageal reflux disease without esophagitis 05/24/2017  . Impingement syndrome of left shoulder 10/02/2016  . Trochanteric bursitis of left hip 10/02/2016  . SOB (shortness of breath) 04/16/2016  . Uterine prolapse 04/09/2016  . Cystocele 04/09/2016  . Hyponatremia 04/09/2016  . Benign essential HTN 11/18/2015  . Paroxysmal supraventricular tachycardia ([place]) 11/18/2015  . Premature  ventricular contraction 07/20/2014  . History of colonic polyps 05/29/2014  . Asthma 02/05/2014  . Chest pain 02/05/2014  . Increased frequency of urination 02/05/2014  . Tachycardia 02/05/2014  . Female stress incontinence 12/14/2013  . Gross hematuria 12/14/2013  . Incomplete emptying of bladder 12/14/2013  . Other chronic cystitis without hematuria 12/14/2013    1. Chronic Asthma well controleld at this time. Her spirometry is normal. No recent flares noted 2. PSVT/Tachycardia she follows with cardiology 3. GERD is controlled 4. Oxygen dpepndence will continue on current settings  General Counseling: I have discussed the findings of the evaluation and examination with Kattia.  I have also discussed any further diagnostic evaluation thatmay be needed or ordered today. Fiana verbalizes understanding of the findings of todays visit. We also reviewed her medications today and discussed drug interactions and side effects including but not limited excessive drowsiness and altered mental states. We also discussed that there is always a risk not just to her but also people around her. she has been encouraged to call the office with any questions or concerns that should arise related to todays visit.  Orders Placed This Encounter  Procedures  . Spirometry with Graph    Order Specific Question:   Where should this test be performed?    Answer:   Other     Time spent: 30  I have personally obtained a history, examined the patient, evaluated laboratory and imaging results, formulated the assessment and plan and placed orders.    Allyne Gee, MD [facility] Pulmonary and Critical Care Sleep medicine

## 2020-04-18 NOTE — Patient Instructions (Signed)
Home Oxygen Use, Adult When a medical condition keeps you from getting enough oxygen, your health care provider may instruct you to take extra oxygen at home. Your health care provider will let you know:  When to take oxygen.  For how long to take oxygen.  How quickly oxygen should be delivered (flow rate), in liters per minute (LPM or L/M). Home oxygen can be given through:  A mask.  A nasal cannula. This is a device or tube that goes in the nostrils.  A transtracheal catheter. This is a small, flexible tube placed in the trachea.  A tracheostomy. This is a surgically made opening in the trachea. These devices are connected with tubing to an oxygen source, such as:  A tank. Tanks hold oxygen in gas form. They must be replaced when the oxygen is used up.  A liquid oxygen device. This holds oxygen in liquid form. It must be replaced when the oxygen is used up.  An oxygen concentrator machine. This filters oxygen in the room. It uses electricity, so you must have a backup cylinder of oxygen in case the power goes out. Supplies needed: To use oxygen, you will need:  A mask, nasal cannula, transtracheal catheter, or tracheostomy.  An oxygen tank, a liquid oxygen device, or an oxygen concentrator.  The tape that your health care provider recommends (optional). If you use a transtracheal catheter and your prescribed flow rate is 1 LPM or greater, you will also need a humidifier. Risks and complications  Fire. This can happen if the oxygen is exposed to a heat source, flame, or spark.  Injury to skin. This can happen if liquid oxygen touches your skin.  Organ damage. This can happen if you get too little oxygen. How to use oxygen Your health care provider or a representative from your medical device company will show you how to use your oxygen device. Follow her or his instructions. The instructions may look something like this: 1. Wash your hands. 2. If you use an oxygen  concentrator, make sure it is plugged in. 3. Place one end of the tube into the port on the tank, device, or machine. 4. Place the mask over your nose and mouth. Or, place the nasal cannula and secure it with tape if instructed. If you use a tracheostomy or transtracheal catheter, connect it to the oxygen source as directed. 5. Make sure the liter-flow setting on the machine is at the level prescribed by your health care provider. 6. Turn on the machine or adjust the knob on the tank or device to the correct liter-flow setting. 7. When you are done, turn off and unplug the machine, or turn the knob to OFF. How to clean and care for the oxygen supplies Nasal cannula  Clean it with a warm, wet cloth daily or as needed.  Wash it with a liquid soap once a week.  Rinse it thoroughly once or twice a week.  Replace it every 2-4 weeks.  If you have an infection, such as a cold or pneumonia, change the cannula when you get better. Mask  Replace it every 2-4 weeks.  If you have an infection, such as a cold or pneumonia, change the mask when you get better. Humidifier bottle  Wash the bottle between each refill: ? Wash it with soap and warm water. ? Rinse it thoroughly. ? Disinfect it and its top. ? Air-dry it.  Make sure it is dry before you refill it. Oxygen concentrator  Clean   the air filter at least twice a week according to directions from your home medical equipment and service company.  Wipe down the cabinet every day. To do this: ? Unplug the unit. ? Wipe down the cabinet with a damp cloth. ? Dry the cabinet. Other equipment  Change any extra tubing every 1-3 months.  Follow instructions from your health care provider about taking care of any other equipment. Safety tips Fire safety tips   Keep your oxygen and oxygen supplies at least 5 ft away from sources of heat, flames, and sparks at all times.  Do not allow smoking near your oxygen. Put up "no smoking" signs in  your home. Avoid smoking areas when in public.  Do not use materials that can burn (are flammable) while you use oxygen.  When you go to a restaurant with portable oxygen, ask to be seated in the nonsmoking section.  Keep a fire extinguisher close by. Let your fire department know that you have oxygen in your home.  Test your home smoke detectors regularly. Traveling  Secure your oxygen tank in the vehicle so that it does not move around. Follow instructions from your medical device company about how to safely secure your tank.  Make sure you have enough oxygen for the amount of time you will be away from home.  If you are planning air travel, contact the airline to find out if they allow the use of an approved portable oxygen concentrator. You may also need documents from your health care provider and medical device company before you travel. General safety tips  If you use an oxygen cylinder, make sure it is in a stand or secured to an object that will not move (fixed object).  If you use liquid oxygen, make sure its container is kept upright.  If you use an oxygen concentrator: ? Tell your electric company. Make sure you are given priority service in the event that your power goes out. ? Avoid using extension cords, if possible. Follow these instructions at home:  Use oxygen only as told by your health care provider.  Do not use alcohol or other drugs that make you relax (sedating drugs) unless instructed. They can slow down your breathing rate and make it hard to get in enough oxygen.  Know how and when to order a refill of oxygen.  Always keep a spare tank of oxygen. Plan ahead for holidays when you may not be able to get a prescription filled.  Use water-based lubricants on your lips or nostrils. Do not use oil-based products like petroleum jelly.  To prevent skin irritation on your cheeks or behind your ears, tuck some gauze under the tubing. Contact a health care  provider if:  You get headaches often.  You have shortness of breath.  You have a lasting cough.  You have anxiety.  You are sleepy all the time.  You develop an illness that affects your breathing.  You cannot exercise at your regular level.  You are restless.  You have difficult or irregular breathing, and it is getting worse.  You have a fever.  You have persistent redness under your nose. Get help right away if:  You are confused.  You have blue lips or fingernails.  You are struggling to breathe. Summary  Your health care provider or a representative from your medical device company will show you how to use your oxygen device. Follow her or his instructions.  If you use an oxygen concentrator, make   sure it is plugged in.  Make sure the liter-flow setting on the machine is at the level prescribed by your health care provider.  Keep your oxygen and oxygen supplies at least 5 ft away from sources of heat, flames, and sparks at all times. This information is not intended to replace advice given to you by your health care provider. Make sure you discuss any questions you have with your health care provider. Document Revised: 01/13/2018 Document Reviewed: 02/18/2016 [employer] Patient Education  2020 [employer]  

## 2020-04-19 ENCOUNTER — Ambulatory Visit: Payer: Medicare Other | Admitting: Nurse Practitioner

## 2020-04-21 ENCOUNTER — Encounter: Payer: Self-pay | Admitting: Nurse Practitioner

## 2020-04-21 DIAGNOSIS — K279 Peptic ulcer, site unspecified, unspecified as acute or chronic, without hemorrhage or perforation: Secondary | ICD-10-CM | POA: Insufficient documentation

## 2020-04-21 DIAGNOSIS — F40240 Claustrophobia: Secondary | ICD-10-CM

## 2020-04-21 HISTORY — DX: Claustrophobia: F40.240

## 2020-04-22 DIAGNOSIS — M47816 Spondylosis without myelopathy or radiculopathy, lumbar region: Secondary | ICD-10-CM | POA: Diagnosis not present

## 2020-04-30 DIAGNOSIS — R768 Other specified abnormal immunological findings in serum: Secondary | ICD-10-CM | POA: Diagnosis not present

## 2020-04-30 DIAGNOSIS — M5136 Other intervertebral disc degeneration, lumbar region: Secondary | ICD-10-CM | POA: Diagnosis not present

## 2020-04-30 DIAGNOSIS — M8949 Other hypertrophic osteoarthropathy, multiple sites: Secondary | ICD-10-CM | POA: Diagnosis not present

## 2020-05-10 DIAGNOSIS — M47816 Spondylosis without myelopathy or radiculopathy, lumbar region: Secondary | ICD-10-CM | POA: Diagnosis not present

## 2020-05-15 DIAGNOSIS — R739 Hyperglycemia, unspecified: Secondary | ICD-10-CM | POA: Diagnosis not present

## 2020-05-15 DIAGNOSIS — E042 Nontoxic multinodular goiter: Secondary | ICD-10-CM | POA: Diagnosis not present

## 2020-05-15 DIAGNOSIS — E039 Hypothyroidism, unspecified: Secondary | ICD-10-CM | POA: Diagnosis not present

## 2020-05-16 DIAGNOSIS — M7061 Trochanteric bursitis, right hip: Secondary | ICD-10-CM | POA: Diagnosis not present

## 2020-05-16 DIAGNOSIS — M25551 Pain in right hip: Secondary | ICD-10-CM | POA: Diagnosis not present

## 2020-05-16 DIAGNOSIS — M5441 Lumbago with sciatica, right side: Secondary | ICD-10-CM | POA: Diagnosis not present

## 2020-05-16 DIAGNOSIS — M7062 Trochanteric bursitis, left hip: Secondary | ICD-10-CM | POA: Diagnosis not present

## 2020-05-16 DIAGNOSIS — G8929 Other chronic pain: Secondary | ICD-10-CM | POA: Diagnosis not present

## 2020-05-16 DIAGNOSIS — M5416 Radiculopathy, lumbar region: Secondary | ICD-10-CM | POA: Diagnosis not present

## 2020-05-16 DIAGNOSIS — M5442 Lumbago with sciatica, left side: Secondary | ICD-10-CM | POA: Diagnosis not present

## 2020-05-16 DIAGNOSIS — M1611 Unilateral primary osteoarthritis, right hip: Secondary | ICD-10-CM | POA: Diagnosis not present

## 2020-05-21 DIAGNOSIS — M5136 Other intervertebral disc degeneration, lumbar region: Secondary | ICD-10-CM | POA: Diagnosis not present

## 2020-05-21 DIAGNOSIS — M47816 Spondylosis without myelopathy or radiculopathy, lumbar region: Secondary | ICD-10-CM | POA: Diagnosis not present

## 2020-05-21 DIAGNOSIS — Z23 Encounter for immunization: Secondary | ICD-10-CM | POA: Diagnosis not present

## 2020-05-23 DIAGNOSIS — M5416 Radiculopathy, lumbar region: Secondary | ICD-10-CM | POA: Diagnosis not present

## 2020-05-28 DIAGNOSIS — H353211 Exudative age-related macular degeneration, right eye, with active choroidal neovascularization: Secondary | ICD-10-CM | POA: Diagnosis not present

## 2020-05-28 DIAGNOSIS — M5416 Radiculopathy, lumbar region: Secondary | ICD-10-CM | POA: Diagnosis not present

## 2020-05-30 DIAGNOSIS — M5416 Radiculopathy, lumbar region: Secondary | ICD-10-CM | POA: Diagnosis not present

## 2020-06-04 DIAGNOSIS — M19072 Primary osteoarthritis, left ankle and foot: Secondary | ICD-10-CM | POA: Diagnosis not present

## 2020-06-04 DIAGNOSIS — M19071 Primary osteoarthritis, right ankle and foot: Secondary | ICD-10-CM | POA: Diagnosis not present

## 2020-06-12 DIAGNOSIS — M5416 Radiculopathy, lumbar region: Secondary | ICD-10-CM | POA: Diagnosis not present

## 2020-06-14 DIAGNOSIS — M5416 Radiculopathy, lumbar region: Secondary | ICD-10-CM | POA: Diagnosis not present

## 2020-06-17 DIAGNOSIS — D2262 Melanocytic nevi of left upper limb, including shoulder: Secondary | ICD-10-CM | POA: Diagnosis not present

## 2020-06-17 DIAGNOSIS — D2271 Melanocytic nevi of right lower limb, including hip: Secondary | ICD-10-CM | POA: Diagnosis not present

## 2020-06-17 DIAGNOSIS — L304 Erythema intertrigo: Secondary | ICD-10-CM | POA: Diagnosis not present

## 2020-06-17 DIAGNOSIS — Z23 Encounter for immunization: Secondary | ICD-10-CM | POA: Diagnosis not present

## 2020-06-17 DIAGNOSIS — D2261 Melanocytic nevi of right upper limb, including shoulder: Secondary | ICD-10-CM | POA: Diagnosis not present

## 2020-06-17 DIAGNOSIS — L538 Other specified erythematous conditions: Secondary | ICD-10-CM | POA: Diagnosis not present

## 2020-06-17 DIAGNOSIS — L570 Actinic keratosis: Secondary | ICD-10-CM | POA: Diagnosis not present

## 2020-06-17 DIAGNOSIS — L820 Inflamed seborrheic keratosis: Secondary | ICD-10-CM | POA: Diagnosis not present

## 2020-06-17 DIAGNOSIS — D225 Melanocytic nevi of trunk: Secondary | ICD-10-CM | POA: Diagnosis not present

## 2020-06-17 DIAGNOSIS — X32XXXA Exposure to sunlight, initial encounter: Secondary | ICD-10-CM | POA: Diagnosis not present

## 2020-06-18 DIAGNOSIS — M5416 Radiculopathy, lumbar region: Secondary | ICD-10-CM | POA: Diagnosis not present

## 2020-06-21 DIAGNOSIS — M5416 Radiculopathy, lumbar region: Secondary | ICD-10-CM | POA: Diagnosis not present

## 2020-06-25 DIAGNOSIS — M5416 Radiculopathy, lumbar region: Secondary | ICD-10-CM | POA: Diagnosis not present

## 2020-06-27 DIAGNOSIS — M5416 Radiculopathy, lumbar region: Secondary | ICD-10-CM | POA: Diagnosis not present

## 2020-07-02 DIAGNOSIS — M5416 Radiculopathy, lumbar region: Secondary | ICD-10-CM | POA: Diagnosis not present

## 2020-07-09 DIAGNOSIS — M5416 Radiculopathy, lumbar region: Secondary | ICD-10-CM | POA: Diagnosis not present

## 2020-07-11 DIAGNOSIS — M5416 Radiculopathy, lumbar region: Secondary | ICD-10-CM | POA: Diagnosis not present

## 2020-07-16 DIAGNOSIS — M5416 Radiculopathy, lumbar region: Secondary | ICD-10-CM | POA: Diagnosis not present

## 2020-07-18 DIAGNOSIS — M5416 Radiculopathy, lumbar region: Secondary | ICD-10-CM | POA: Diagnosis not present

## 2020-07-22 ENCOUNTER — Telehealth: Payer: Self-pay

## 2020-07-22 ENCOUNTER — Ambulatory Visit (INDEPENDENT_AMBULATORY_CARE_PROVIDER_SITE_OTHER): Payer: Medicare Other | Admitting: Internal Medicine

## 2020-07-22 ENCOUNTER — Encounter: Payer: Self-pay | Admitting: Internal Medicine

## 2020-07-22 ENCOUNTER — Other Ambulatory Visit: Payer: Self-pay

## 2020-07-22 VITALS — BP 150/74 | HR 81 | Temp 98.0°F | Resp 16 | Ht 60.25 in | Wt 150.4 lb

## 2020-07-22 DIAGNOSIS — R0602 Shortness of breath: Secondary | ICD-10-CM

## 2020-07-22 DIAGNOSIS — I471 Supraventricular tachycardia: Secondary | ICD-10-CM

## 2020-07-22 DIAGNOSIS — Z9981 Dependence on supplemental oxygen: Secondary | ICD-10-CM

## 2020-07-22 DIAGNOSIS — J4520 Mild intermittent asthma, uncomplicated: Secondary | ICD-10-CM

## 2020-07-22 NOTE — Patient Instructions (Signed)
Home Oxygen Use, Adult When a medical condition keeps you from getting enough oxygen, your health care provider may instruct you to take extra oxygen at home. Your health care provider will let you know:  When to take oxygen.  For how long to take oxygen.  How quickly oxygen should be delivered (flow rate), in liters per minute (LPM or L/M). Home oxygen can be given through:  A mask.  A nasal cannula. This is a device or tube that goes in the nostrils.  A transtracheal catheter. This is a small, flexible tube placed in the trachea.  A tracheostomy. This is a surgically made opening in the trachea. These devices are connected with tubing to an oxygen source, such as:  A tank. Tanks hold oxygen in gas form. They must be replaced when the oxygen is used up.  A liquid oxygen device. This holds oxygen in liquid form. It must be replaced when the oxygen is used up.  An oxygen concentrator machine. This filters oxygen in the room. It uses electricity, so you must have a backup cylinder of oxygen in case the power goes out. Supplies needed: To use oxygen, you will need:  A mask, nasal cannula, transtracheal catheter, or tracheostomy.  An oxygen tank, a liquid oxygen device, or an oxygen concentrator.  The tape that your health care provider recommends (optional). If you use a transtracheal catheter and your prescribed flow rate is 1 LPM or greater, you will also need a humidifier. Risks and complications  Fire. This can happen if the oxygen is exposed to a heat source, flame, or spark.  Injury to skin. This can happen if liquid oxygen touches your skin.  Organ damage. This can happen if you get too little oxygen. How to use oxygen Your health care provider or a representative from your medical device company will show you how to use your oxygen device. Follow her or his instructions. The instructions may look something like this: 1. Wash your hands. 2. If you use an oxygen  concentrator, make sure it is plugged in. 3. Place one end of the tube into the port on the tank, device, or machine. 4. Place the mask over your nose and mouth. Or, place the nasal cannula and secure it with tape if instructed. If you use a tracheostomy or transtracheal catheter, connect it to the oxygen source as directed. 5. Make sure the liter-flow setting on the machine is at the level prescribed by your health care provider. 6. Turn on the machine or adjust the knob on the tank or device to the correct liter-flow setting. 7. When you are done, turn off and unplug the machine, or turn the knob to OFF. How to clean and care for the oxygen supplies Nasal cannula  Clean it with a warm, wet cloth daily or as needed.  Wash it with a liquid soap once a week.  Rinse it thoroughly once or twice a week.  Replace it every 2-4 weeks.  If you have an infection, such as a cold or pneumonia, change the cannula when you get better. Mask  Replace it every 2-4 weeks.  If you have an infection, such as a cold or pneumonia, change the mask when you get better. Humidifier bottle  Wash the bottle between each refill: ? Wash it with soap and warm water. ? Rinse it thoroughly. ? Disinfect it and its top. ? Air-dry it.  Make sure it is dry before you refill it. Oxygen concentrator  Clean   the air filter at least twice a week according to directions from your home medical equipment and service company.  Wipe down the cabinet every day. To do this: ? Unplug the unit. ? Wipe down the cabinet with a damp cloth. ? Dry the cabinet. Other equipment  Change any extra tubing every 1-3 months.  Follow instructions from your health care provider about taking care of any other equipment. Safety tips Fire safety tips   Keep your oxygen and oxygen supplies at least 5 ft away from sources of heat, flames, and sparks at all times.  Do not allow smoking near your oxygen. Put up "no smoking" signs in  your home. Avoid smoking areas when in public.  Do not use materials that can burn (are flammable) while you use oxygen.  When you go to a restaurant with portable oxygen, ask to be seated in the nonsmoking section.  Keep a fire extinguisher close by. Let your fire department know that you have oxygen in your home.  Test your home smoke detectors regularly. Traveling  Secure your oxygen tank in the vehicle so that it does not move around. Follow instructions from your medical device company about how to safely secure your tank.  Make sure you have enough oxygen for the amount of time you will be away from home.  If you are planning air travel, contact the airline to find out if they allow the use of an approved portable oxygen concentrator. You may also need documents from your health care provider and medical device company before you travel. General safety tips  If you use an oxygen cylinder, make sure it is in a stand or secured to an object that will not move (fixed object).  If you use liquid oxygen, make sure its container is kept upright.  If you use an oxygen concentrator: ? Tell your electric company. Make sure you are given priority service in the event that your power goes out. ? Avoid using extension cords, if possible. Follow these instructions at home:  Use oxygen only as told by your health care provider.  Do not use alcohol or other drugs that make you relax (sedating drugs) unless instructed. They can slow down your breathing rate and make it hard to get in enough oxygen.  Know how and when to order a refill of oxygen.  Always keep a spare tank of oxygen. Plan ahead for holidays when you may not be able to get a prescription filled.  Use water-based lubricants on your lips or nostrils. Do not use oil-based products like petroleum jelly.  To prevent skin irritation on your cheeks or behind your ears, tuck some gauze under the tubing. Contact a health care  provider if:  You get headaches often.  You have shortness of breath.  You have a lasting cough.  You have anxiety.  You are sleepy all the time.  You develop an illness that affects your breathing.  You cannot exercise at your regular level.  You are restless.  You have difficult or irregular breathing, and it is getting worse.  You have a fever.  You have persistent redness under your nose. Get help right away if:  You are confused.  You have blue lips or fingernails.  You are struggling to breathe. Summary  Your health care provider or a representative from your medical device company will show you how to use your oxygen device. Follow her or his instructions.  If you use an oxygen concentrator, make   sure it is plugged in.  Make sure the liter-flow setting on the machine is at the level prescribed by your health care provider.  Keep your oxygen and oxygen supplies at least 5 ft away from sources of heat, flames, and sparks at all times. This information is not intended to replace advice given to you by your health care provider. Make sure you discuss any questions you have with your health care provider. Document Revised: 01/13/2018 Document Reviewed: 02/18/2016 [employer] Patient Education  2020 [employer]  

## 2020-07-22 NOTE — Telephone Encounter (Signed)
Information for Overnight pulse oxi was given to Judson Roch [phone]

## 2020-07-22 NOTE — Progress Notes (Signed)
[facility] [place], [place] [address]  Pulmonary Sleep Medicine   Office Visit Note  Patient Name: Vanessa Evans DOB: 21-Nov-1945 MRN [id number]  Date of Service: 07/22/2020  Complaints/HPI: She is doing well with her asthma She continues to use her oxygen at night. Patient states that she has to get recertified. Has not had any flares of her asthma. Uses rescue sparingly. No cardiac issues since last visit.  Denies having any chest pain no palpitations.  Occasional shortness of breath.  No fevers no chills.  She is vaccinated against Covid  ROS  General: (-) fever, (-) chills, (-) night sweats, (-) weakness Skin: (-) rashes, (-) itching,. Eyes: (-) visual changes, (-) redness, (-) itching. Nose and Sinuses: (-) nasal stuffiness or itchiness, (-) postnasal drip, (-) nosebleeds, (-) sinus trouble. Mouth and Throat: (-) sore throat, (-) hoarseness. Neck: (-) swollen glands, (-) enlarged thyroid, (-) neck pain. Respiratory: - cough, (-) bloody sputum, + shortness of breath, - wheezing. Cardiovascular: - ankle swelling, (-) chest pain. Lymphatic: (-) lymph node enlargement. Neurologic: (-) numbness, (-) tingling. Psychiatric: (-) anxiety, (-) depression   Current Medication: Outpatient Encounter Medications as of 07/22/2020  Medication Sig   acetaminophen (TYLENOL) 500 MG tablet Take 1,000 mg by mouth 2 (two) times daily as needed for moderate pain.   albuterol (PROVENTIL) (2.5 MG/3ML) 0.083% nebulizer solution Take 3 mLs (2.5 mg total) by nebulization every 6 (six) hours as needed for wheezing or shortness of breath.   albuterol (VENTOLIN HFA) 108 (90 Base) MCG/ACT inhaler Inhale 2 puffs into the lungs every 4 (four) hours as needed for wheezing or shortness of breath.   aspirin EC 81 MG tablet Take 81 mg by mouth at bedtime.    benzonatate (TESSALON) 100 MG capsule Take 2 capsules (200 mg total) by mouth 2 (two) times daily as needed for cough.    BLACK ELDERBERRY PO Take 1 Dose by mouth 2 (two) times a day.   Calcium Carbonate-Vitamin D 600-400 MG-UNIT tablet Take 1 tablet by mouth 2 (two) times daily.   cholecalciferol (VITAMIN D) 1000 UNITS tablet Take 1,000 Units by mouth at bedtime.    citalopram (CELEXA) 10 MG tablet Take 1 tablet (10 mg total) by mouth daily.   cyanocobalamin 500 MCG tablet Take 500 mcg by mouth daily.   Dextran 70-Hypromellose (ARTIFICIAL TEARS) 0.1-0.3 % SOLN Apply 1-2 drops to eye daily.   diazepam (VALIUM) 5 MG tablet Take 1 tablet po one time two hours prior to procedure. May repeat dose prior to leaving home for procedure if symptoms are persistent. Patient must have driver to and from procedure.   diclofenac Sodium (VOLTAREN) 1 % GEL Apply 4 g topically 4 (four) times daily.   diltiazem (CARDIZEM CD) 120 MG 24 hr capsule Take 120 mg by mouth at bedtime.    diltiazem (TIAZAC) 120 MG 24 hr capsule Take 1 capsule by mouth daily.   diphenhydrAMINE (BENADRYL) 25 MG tablet Take 25-50 mg by mouth daily as needed for itching.   docusate sodium (COLACE) 100 MG capsule Take 200 mg by mouth at bedtime as needed for moderate constipation.    ELDERBERRY PO Take 1 tablet by mouth 2 (two) times daily.   EPINEPHrine (EPIPEN 2-PAK) 0.3 mg/0.3 mL IJ SOAJ injection use as directed for severe allergy reaction   fenofibrate 160 MG tablet take1 tab po daily for chol   fluticasone (FLONASE) 50 MCG/ACT nasal spray Use 2 sprays in each nostril daily,  folic acid (FOLVITE) 798 MCG tablet Take 400 mcg by mouth daily.   gabapentin (NEURONTIN) 100 MG capsule Take 100 mg by mouth 2 (two) times daily.   guaiFENesin (MUCINEX) 600 MG 12 hr tablet Take 600 mg by mouth at bedtime as needed (congestion).   Hypromellose (ARTIFICIAL TEARS OP) Place 1-2 drops into both eyes daily as needed (dry eyes).   ketotifen (ZADITOR) 0.025 % ophthalmic solution Place 1 drop into both eyes 2 (two) times daily as needed (allergies).    levocetirizine (XYZAL) 5 MG tablet Take 1 tablet (5 mg total) by mouth every evening.   levothyroxine (SYNTHROID, LEVOTHROID) 50 MCG tablet Take 50 mcg by mouth daily before breakfast. Brand Name only   Magnesium 250 MG TABS Take 1 tablet by mouth daily.   Melatonin 5 MG TABS Take 1 tablet by mouth at bedtime. For sleep   Multiple Vitamins-Minerals (MULTIVITAMIN WITH MINERALS) tablet Take 1 tablet by mouth daily.   OXYGEN Inhale into the lungs. 2 litre at night   pantoprazole (PROTONIX) 40 MG tablet Take 1 tablet (40 mg total) by mouth 2 (two) times daily.   Phenylephrine-guaiFENesin 10-400 MG TABS Take 1 tablet by mouth every 4 (four) hours as needed (congestion).   pyridoxine (B-6) 100 MG tablet Take 100 mg by mouth daily.   sodium chloride (OCEAN) 0.65 % SOLN nasal spray Place 1 spray into both nostrils 2 (two) times daily.   Specialty Vitamins Products (ECHINACEA C COMPLETE PO) Take by mouth.   sucralfate (CARAFATE) 1 GM/10ML suspension Take 10 mLs (1 g total) by mouth 3 (three) times daily before meals.   Vitamin A 7.5 MG (25000 UT) CAPS Take 1 tablet by mouth 2 (two) times daily.   zinc gluconate 50 MG tablet Take 50 mg by mouth daily.   [DISCONTINUED] estradiol (ESTRACE VAGINAL) 0.1 MG/GM vaginal cream Apply a pea-sized amount to urethra at bedtime (Patient not taking: Reported on 07/22/2020)   No facility-administered encounter medications on file as of 07/22/2020.    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 cope.  all benign.  inflammatory process going on in bladder   BREAST BIOPSY Right    benign   BREAST SURGERY Right    lumpectomy   CATARACT EXTRACTION W/PHACO Left 12/25/2014   Procedure: CATARACT EXTRACTION PHACO AND INTRAOCULAR LENS PLACEMENT ([place]);   Surgeon: Birder Robson, MD;  Location: [facility] ORS;  Service: Ophthalmology;  Laterality: Left;  [place] 00:32 AP% 20.0 CDE 6.49   COLONOSCOPY W/ BIOPSIES     COLONOSCOPY W/ POLYPECTOMY  2002, 2004, 2005   adenomatous polyps removed   COLONOSCOPY WITH PROPOFOL N/A 08/23/2017   Procedure: COLONOSCOPY WITH PROPOFOL;  Surgeon: Manya Silvas, MD;  Location[facility] ENDOSCOPY;  Service: Endoscopy;  Laterality: N/A;   COLONOSCOPY WITH PROPOFOL N/A 11/08/2017   Procedure: COLONOSCOPY WITH PROPOFOL;  Surgeon: Manya Silvas, MD;  [facility]: [facility] ENDOSCOPY;  Service: Endoscopy;  Laterality: N/A;   CYSTOCELE REPAIR N/A 04/09/2016   Procedure: ANTERIOR REPAIR (CYSTOCELE);  Surgeon: Gae Dry, MD;  Location: [facility] ORS;  Service: Gynecology;  Laterality: N/A;   DIAGNOSTIC LAPAROSCOPY  2008   removed both ovaries with cysts, tubes and fibroids   [place]  ESOPHAGOGASTRODUODENOSCOPY (EGD) WITH PROPOFOL N/A 08/23/2017   Procedure: ESOPHAGOGASTRODUODENOSCOPY (EGD) WITH PROPOFOL;  Surgeon: Manya Silvas, MD;  Location: [facility] ENDOSCOPY;  Service: Endoscopy;  Laterality: N/A;   EYE SURGERY Bilateral 2016   HERNIA REPAIR Right 2008   Inguinal hernia Repair, ventral hernia repair   JOINT REPLACEMENT Right 2008   knee   KNEE ARTHROSCOPY Right    LUMBAR LAMINECTOMY/DECOMPRESSION MICRODISCECTOMY Right 03/21/2013   Procedure: Right Lumbar five-Sacral one Laminectomy for Synovial Cyst;  Surgeon: Faythe Ghee, MD;  Location: [facility] NEURO ORS;  Service: Neurosurgery;  Laterality: Right;  right   OOPHORECTOMY     REVERSE SHOULDER ARTHROPLASTY Right 02/22/2018   Procedure: REVERSE SHOULDER ARTHROPLASTY;  Surgeon: Corky Mull, MD;  Location: [facility] ORS;  Service: Orthopedics;  Laterality: Right;   TUBAL LIGATION     UNILATERAL SALPINGECTOMY Left 04/09/2016   Procedure: UNILATERAL SALPINGECTOMY;  Surgeon: Gae Dry, MD;  Location: [facility] ORS;  Service: Gynecology;   Laterality: Left;   UPPER GI ENDOSCOPY  2010   with biopsy of gastric erosion   VAGINAL HYSTERECTOMY N/A 04/09/2016   Procedure: HYSTERECTOMY VAGINAL;  Surgeon: Gae Dry, MD;  Location: [facility] ORS;  Service: Gynecology;  Laterality: N/A;   VAGINAL HYSTERECTOMY  2017   and bladder tack    Medical History: Past Medical History:  Diagnosis Date   Arthritis    osteo   Asthma    needs rescue inhaler few times a year   Claustrophobia 04/21/2020   Depression    Dizziness    light headed spells but it has been a while   Dysrhythmia    tachycardia.(cardizem). brady during procedures   GERD (gastroesophageal reflux disease)    gastritis   Headache(784.0)    Heart murmur 2019   aortic. dr. Nehemiah Massed not worried about this   Hyperlipidemia    Hypertension    Hypothyroidism    Migraine    Orthopnea    Pneumonia    long time ago (greater than 5 years ago)   PONV (postoperative nausea and vomiting)    very anxious during cataract surgery. brady during colonoscopy   Shortness of breath    any exertion, cannot lie flat    Family History: Family History  Problem Relation Age of Onset   Pulmonary fibrosis Mother    Melanoma Father    Cardiomyopathy Sister        and arrhythmia   Breast cancer Cousin        paternal 1st    Social History: Social History   Socioeconomic History   Marital status: Married    Spouse name: Not on file   Number of children: Not on file   Years of education: Not on file   Highest education level: Not on file  Occupational History   Not on file  Tobacco Use   Smoking status: Former Smoker    Types: Cigarettes    Quit date: 1967    Years since quitting: 54.9   Smokeless tobacco: Never Used  Scientific laboratory technician Use: Never used  Substance and Sexual Activity   Alcohol use: Not Currently   Drug use: No   Sexual activity: Not Currently  Other Topics Concern   Not on file  Social History Narrative    Not on file   Social Determinants of Health   Financial Resource Strain: Not on file  Food Insecurity: Not on file  Transportation Needs: Not on file  Physical Activity: Not on file  Stress: Not on file  Social Connections: Not on file  Intimate Partner Violence: Not on file    Vital Signs: Blood pressure (!) 150/74, pulse 81, temperature 98 F (36.7 C), resp. rate 16, height 5' 0.25" (1.53 m), weight 150 lb 6.4 oz (68.2 kg), SpO2 96 %.  Examination: General Appearance: The patient is well-developed, well-nourished, and in no distress. Skin: Gross inspection of skin unremarkable. Head: normocephalic, no gross deformities. Eyes: no gross deformities noted. ENT: ears appear grossly normal no exudates. Neck: Supple. No thyromegaly. No LAD. Respiratory: no rhonchi noted at this time. Cardiovascular: Normal S1 and S2 without murmur or rub. Extremities: No cyanosis. pulses are equal. Neurologic: Alert and oriented. No involuntary movements.  LABS: No results found for this or any previous visit (from the past 2160 hour(s)).  Radiology: MR LUMBAR SPINE WO CONTRAST  Result Date: 04/11/2020 CLINICAL DATA:  Chronic low back pain. Occasional pain into the right buttock. EXAM: MRI LUMBAR SPINE WITHOUT CONTRAST TECHNIQUE: Multiplanar, multisequence MR imaging of the lumbar spine was performed. No intravenous contrast was administered. COMPARISON:  MRI of the lumbar spine 09/30/2018 FINDINGS: Segmentation: 5 non rib-bearing lumbar type vertebral bodies are present. The lowest fully formed vertebral body is L5. Alignment: 8 mm anterolisthesis at L5-S1 is stable secondary to right-sided pars defect advanced facet hypertrophy on the left. No other significant listhesis is present. There is some straightening of the normal lumbar lordosis. Vertebrae: Chronic fatty endplate marrow changes are again noted at L3-4. Marrow signal and vertebral body heights are otherwise normal. Conus medullaris and cauda  equina: Conus extends to the T12-L1 level. Conus and cauda equina appear normal. Paraspinal and other soft tissues: Limited imaging the abdomen is unremarkable. There is no significant adenopathy. No solid organ lesions are present. Disc levels: T12-L1: Negative. L1-2: Negative. L2-3: Disc bulging and mild bilateral facet hypertrophy is present. New significant stenosis is present. L3-4: A broad-based disc protrusion is present. Moderate facet hypertrophy is noted. Moderate right and mild left foraminal narrowing is present. Mild right subarticular narrowing is present. L4-5: A broad-based disc protrusion is present. Moderate facet hypertrophy and ligamentum flavum thickening is noted. Mild bilateral foraminal stenosis is present. L5-S1: Moderate facet hypertrophy is present bilaterally. Dilated perineural root sleeve cyst is present on the right. Right laminectomy is noted. No significant central canal stenosis is present. Foramina are patent bilaterally. IMPRESSION: 1. Stable anterolisthesis at L5-S1 secondary to right-sided pars defect in advanced facet hypertrophy on the left. 2. Mild bilateral foraminal narrowing at L4-5. 3. Moderate right and mild left foraminal stenosis at L3-4 with mild right subarticular narrowing. Chronic endplate marrow changes are present at this level. 4. Disc bulging and mild bilateral facet hypertrophy at L2-3 without significant stenosis. Electronically Signed   By: San Morelle M.D.   On: 04/11/2020 22:31    No results found.  No results found.    Assessment and Plan: Patient Active Problem List   Diagnosis Date Noted   Peptic ulcer disease 04/21/2020   Claustrophobia 04/21/2020   Urinary tract infection without hematuria 02/04/2020   Pain in both lower extremities 12/18/2019   Inflammatory polyarthritis ([place]) 07/26/2019   Excessive daytime sleepiness 07/26/2019   Encounter for screening mammogram for malignant neoplasm of breast 07/26/2019    Intercostal muscle pain 06/25/2019   Positive ANA (antinuclear antibody) 05/01/2019   Primary osteoarthritis involving multiple joints 04/26/2019   Mild intermittent asthma without complication 94/17/4081   Seasonal allergic rhinitis due to pollen 08/22/2018   Chills  with fever 08/11/2018   Sore throat 08/11/2018   Acute upper respiratory infection 08/11/2018   Candidiasis 08/11/2018   Iron deficiency anemia 05/05/2018   Right hip pain 04/27/2018   Accidental fall from furniture 04/27/2018   Vitamin D deficiency 04/27/2018   Cough 04/18/2018   Dysuria 04/18/2018   Status post reverse total shoulder replacement, right 02/22/2018   Abnormal ECG 02/18/2018   Pain in joint of right shoulder 02/13/2018   Abdominal aortic atherosclerosis ([place]) 02/13/2018   Episode of recurrent major depressive disorder ([place]) 02/13/2018   Encounter for general adult medical examination with abnormal findings 02/13/2018   Lipoma of right shoulder 01/31/2018   Rotator cuff tendinitis, right 01/31/2018   Chronic superficial gastritis without bleeding 12/12/2017   Schatzki's ring 12/12/2017   Acute gastritis 11/08/2017   Nausea 10/18/2017   Other fatigue 10/18/2017   Allergic rhinitis 09/20/2017   Eczema 09/20/2017   Hematuria 09/20/2017   Mixed hyperlipidemia 09/20/2017   Osteoarthritis 09/20/2017   Osteoporosis, post-menopausal 09/20/2017   Palpitations 07/05/2017   Epigastric pain 05/24/2017   Gastroesophageal reflux disease without esophagitis 05/24/2017   Impingement syndrome of left shoulder 10/02/2016   Trochanteric bursitis of left hip 10/02/2016   SOB (shortness of breath) 04/16/2016   Uterine prolapse 04/09/2016   Cystocele 04/09/2016   Hyponatremia 04/09/2016   Benign essential HTN 11/18/2015   Paroxysmal supraventricular tachycardia ([place]) 11/18/2015   Premature ventricular contraction 07/20/2014   History of colonic polyps 05/29/2014    Asthma 02/05/2014   Chest pain 02/05/2014   Increased frequency of urination 02/05/2014   Tachycardia 02/05/2014   Female stress incontinence 12/14/2013   Gross hematuria 12/14/2013   Incomplete emptying of bladder 12/14/2013   Other chronic cystitis without hematuria 12/14/2013    1. Asthma appears to be under fairly good control patient will continue with current medical management.  She is not had any flareups since her last evaluation.  Spirometry was done and reviewed today 2. Oxygen dependent she needs her overnight oximetry rechecked and gone ahead and ordered this.  She will likely continue to need the oxygen at nighttime 3. GERD PPI as necessary again appears to be under good control. 4. PSVT no further episodes of tachycardia have been noted.  Continue to follow-up with primary team  General Counseling: I have discussed the findings of the evaluation and examination with Trang.  I have also discussed any further diagnostic evaluation thatmay be needed or ordered today. Elza verbalizes understanding of the findings of todays visit. We also reviewed her medications today and discussed drug interactions and side effects including but not limited excessive drowsiness and altered mental states. We also discussed that there is always a risk not just to her but also people around her. she has been encouraged to call the office with any questions or concerns that should arise related to todays visit.  Orders Placed This Encounter  Procedures   Pulse oximetry, overnight    Standing Status:   Future    Standing Expiration Date:   07/22/2021   Spirometry with Graph    Order Specific Question:   Where should this test be performed?    Answer:   [facility]    Order Specific Question:   Basic spirometry    Answer:   Yes    Order Specific Question:   Spirometry pre & post bronchodilator    Answer:   No     Time spent: 62min  I have personally obtained a history,  examined the patient, evaluated laboratory and imaging results, formulated the assessment and plan and placed orders.    Allyne Gee, MD [facility] Pulmonary and Critical Care Sleep medicine

## 2020-07-23 DIAGNOSIS — M5416 Radiculopathy, lumbar region: Secondary | ICD-10-CM | POA: Diagnosis not present

## 2020-07-25 DIAGNOSIS — M5416 Radiculopathy, lumbar region: Secondary | ICD-10-CM | POA: Diagnosis not present

## 2020-07-29 DIAGNOSIS — R0902 Hypoxemia: Secondary | ICD-10-CM | POA: Diagnosis not present

## 2020-07-29 DIAGNOSIS — M5416 Radiculopathy, lumbar region: Secondary | ICD-10-CM | POA: Diagnosis not present

## 2020-07-30 ENCOUNTER — Ambulatory Visit: Payer: Self-pay | Admitting: Nurse Practitioner

## 2020-07-30 ENCOUNTER — Telehealth: Payer: Self-pay

## 2020-07-30 NOTE — Telephone Encounter (Signed)
Pt needs appt with pulm, TH for oxygen recerification please

## 2020-08-05 ENCOUNTER — Telehealth: Payer: Self-pay

## 2020-08-05 NOTE — Telephone Encounter (Signed)
Pt called to notify us about exposure to [place] on 08/03/20. Pt states that they will quarantine and get tested within a few days regardless if they are symptomatic or not. Pt also asked if we offer any COVID infusions or the Pfizer pill. Pt also states that they have been triple vaccinated.

## 2020-08-06 ENCOUNTER — Telehealth: Payer: Self-pay

## 2020-08-06 NOTE — Telephone Encounter (Signed)
Called and spoke to [place] with [facility] and he asked that I call the office as he was home sick.  I called office and spoke to [facility] and asked what needed to be done for pt to be re-certified for overnight oxygen.  Per Marchelle Folks I needed to fax the last Office note and test results and that Morrie Sheldon will bring paperwork into office to be signed.

## 2020-08-06 NOTE — Telephone Encounter (Signed)
Faxed paperwork for overnight oxygen to [facility]

## 2020-08-12 ENCOUNTER — Ambulatory Visit: Payer: Medicare Other | Admitting: Nurse Practitioner

## 2020-08-12 ENCOUNTER — Other Ambulatory Visit: Payer: Self-pay | Admitting: Adult Health

## 2020-08-12 DIAGNOSIS — J301 Allergic rhinitis due to pollen: Secondary | ICD-10-CM

## 2020-08-14 ENCOUNTER — Other Ambulatory Visit: Payer: Self-pay

## 2020-08-14 DIAGNOSIS — E782 Mixed hyperlipidemia: Secondary | ICD-10-CM

## 2020-08-14 DIAGNOSIS — J301 Allergic rhinitis due to pollen: Secondary | ICD-10-CM

## 2020-08-14 MED ORDER — FENOFIBRATE 160 MG PO TABS: ORAL_TABLET | ORAL | 1 refills | Status: DC

## 2020-08-14 MED ORDER — LEVOCETIRIZINE DIHYDROCHLORIDE 5 MG PO TABS: 5.0000 mg | ORAL_TABLET | Freq: Every evening | ORAL | 1 refills | Status: DC

## 2020-08-19 ENCOUNTER — Ambulatory Visit: Payer: Medicare Other | Admitting: Internal Medicine

## 2020-08-20 DIAGNOSIS — M5416 Radiculopathy, lumbar region: Secondary | ICD-10-CM | POA: Diagnosis not present

## 2020-08-23 ENCOUNTER — Other Ambulatory Visit: Payer: Self-pay | Admitting: Nurse Practitioner

## 2020-08-23 DIAGNOSIS — E559 Vitamin D deficiency, unspecified: Secondary | ICD-10-CM | POA: Diagnosis not present

## 2020-08-23 DIAGNOSIS — M5416 Radiculopathy, lumbar region: Secondary | ICD-10-CM | POA: Diagnosis not present

## 2020-08-23 DIAGNOSIS — D509 Iron deficiency anemia, unspecified: Secondary | ICD-10-CM | POA: Diagnosis not present

## 2020-08-23 DIAGNOSIS — Z0001 Encounter for general adult medical examination with abnormal findings: Secondary | ICD-10-CM | POA: Diagnosis not present

## 2020-08-23 DIAGNOSIS — I10 Essential (primary) hypertension: Secondary | ICD-10-CM | POA: Diagnosis not present

## 2020-08-24 LAB — B12 AND FOLATE PANEL
Folate: >20 ng/mL (ref >3.0)
Vitamin B-12: 1148 pg/mL (ref 232–1245)

## 2020-08-24 LAB — COMPREHENSIVE METABOLIC PANEL
ALT: 26 IU/L (ref 0–32)
AST: 23 IU/L (ref 0–40)
Albumin/Globulin Ratio: 1.9 (ref 1.2–2.2)
Albumin: 4.4 g/dL (ref 3.7–4.7)
Alkaline Phosphatase: 66 IU/L (ref 44–121)
BUN/Creatinine Ratio: 24 (ref 12–28)
BUN: 17 mg/dL (ref 8–27)
Bilirubin Total: 0.3 mg/dL (ref 0.0–1.2)
CO2: 22 mmol/L (ref 20–29)
Calcium: 9.7 mg/dL (ref 8.7–10.3)
Chloride: 98 mmol/L (ref 96–106)
Creatinine, Ser: 0.71 mg/dL (ref 0.57–1.00)
GFR calc Af Amer: 96 mL/min/{1.73_m2} (ref >59)
GFR calc non Af Amer: 84 mL/min/{1.73_m2} (ref >59)
Globulin, Total: 2.3 g/dL (ref 1.5–4.5)
Glucose: 103 mg/dL — ABNORMAL HIGH (ref 65–99)
Potassium: 4.2 mmol/L (ref 3.5–5.2)
Sodium: 138 mmol/L (ref 134–144)
Total Protein: 6.7 g/dL (ref 6.0–8.5)

## 2020-08-24 LAB — IRON AND TIBC
Iron Saturation: 15 % (ref 15–55)
Iron: 63 ug/dL (ref 27–139)
Total Iron Binding Capacity: 426 ug/dL (ref 250–450)
UIBC: 363 ug/dL (ref 118–369)

## 2020-08-24 LAB — HCV AB W REFLEX TO QUANT PCR: HCV Ab: <0.1 s/co ratio (ref 0.0–0.9)

## 2020-08-24 LAB — CBC
Hematocrit: 36.3 % (ref 34.0–46.6)
Hemoglobin: 12.1 g/dL (ref 11.1–15.9)
MCH: 28.5 pg (ref 26.6–33.0)
MCHC: 33.3 g/dL (ref 31.5–35.7)
MCV: 85 fL (ref 79–97)
Platelets: 331 10*3/uL (ref 150–450)
RBC: 4.25 x10E6/uL (ref 3.77–5.28)
RDW: 12.2 % (ref 11.7–15.4)
WBC: 4.6 10*3/uL (ref 3.4–10.8)

## 2020-08-24 LAB — LIPID PANEL WITH LDL/HDL RATIO
Cholesterol, Total: 172 mg/dL (ref 100–199)
HDL: 48 mg/dL (ref >39)
LDL Chol Calc (NIH): 95 mg/dL (ref 0–99)
LDL/HDL Ratio: 2 ratio (ref 0.0–3.2)
Triglycerides: 166 mg/dL — ABNORMAL HIGH (ref 0–149)
VLDL Cholesterol Cal: 29 mg/dL (ref 5–40)

## 2020-08-24 LAB — VITAMIN D 25 HYDROXY (VIT D DEFICIENCY, FRACTURES): Vit D, 25-Hydroxy: 68.4 ng/mL (ref 30.0–100.0)

## 2020-08-24 LAB — HCV INTERPRETATION

## 2020-08-24 LAB — FERRITIN: Ferritin: 83 ng/mL (ref 15–150)

## 2020-08-27 DIAGNOSIS — M5416 Radiculopathy, lumbar region: Secondary | ICD-10-CM | POA: Diagnosis not present

## 2020-08-29 DIAGNOSIS — M19071 Primary osteoarthritis, right ankle and foot: Secondary | ICD-10-CM | POA: Diagnosis not present

## 2020-08-29 DIAGNOSIS — M79671 Pain in right foot: Secondary | ICD-10-CM | POA: Diagnosis not present

## 2020-08-29 DIAGNOSIS — M5416 Radiculopathy, lumbar region: Secondary | ICD-10-CM | POA: Diagnosis not present

## 2020-08-29 DIAGNOSIS — M79672 Pain in left foot: Secondary | ICD-10-CM | POA: Diagnosis not present

## 2020-08-29 DIAGNOSIS — M19072 Primary osteoarthritis, left ankle and foot: Secondary | ICD-10-CM | POA: Diagnosis not present

## 2020-09-02 DIAGNOSIS — H353211 Exudative age-related macular degeneration, right eye, with active choroidal neovascularization: Secondary | ICD-10-CM | POA: Diagnosis not present

## 2020-09-03 DIAGNOSIS — H353211 Exudative age-related macular degeneration, right eye, with active choroidal neovascularization: Secondary | ICD-10-CM | POA: Diagnosis not present

## 2020-09-06 DIAGNOSIS — M5416 Radiculopathy, lumbar region: Secondary | ICD-10-CM | POA: Diagnosis not present

## 2020-09-09 ENCOUNTER — Ambulatory Visit (INDEPENDENT_AMBULATORY_CARE_PROVIDER_SITE_OTHER): Payer: Medicare Other | Admitting: Physician Assistant

## 2020-09-09 ENCOUNTER — Encounter: Payer: Self-pay | Admitting: Physician Assistant

## 2020-09-09 VITALS — BP 142/70 | HR 75 | Temp 97.8°F | Resp 16 | Ht 60.0 in | Wt 149.0 lb

## 2020-09-09 DIAGNOSIS — K2950 Unspecified chronic gastritis without bleeding: Secondary | ICD-10-CM

## 2020-09-09 DIAGNOSIS — J4520 Mild intermittent asthma, uncomplicated: Secondary | ICD-10-CM | POA: Diagnosis not present

## 2020-09-09 DIAGNOSIS — M810 Age-related osteoporosis without current pathological fracture: Secondary | ICD-10-CM

## 2020-09-09 DIAGNOSIS — B379 Candidiasis, unspecified: Secondary | ICD-10-CM

## 2020-09-09 DIAGNOSIS — R300 Dysuria: Secondary | ICD-10-CM

## 2020-09-09 DIAGNOSIS — I10 Essential (primary) hypertension: Secondary | ICD-10-CM | POA: Diagnosis not present

## 2020-09-09 DIAGNOSIS — F331 Major depressive disorder, recurrent, moderate: Secondary | ICD-10-CM

## 2020-09-09 DIAGNOSIS — R059 Cough, unspecified: Secondary | ICD-10-CM | POA: Diagnosis not present

## 2020-09-09 DIAGNOSIS — Z0001 Encounter for general adult medical examination with abnormal findings: Secondary | ICD-10-CM | POA: Diagnosis not present

## 2020-09-09 DIAGNOSIS — R062 Wheezing: Secondary | ICD-10-CM

## 2020-09-09 MED ORDER — CITALOPRAM HYDROBROMIDE 10 MG PO TABS
10.0000 mg | ORAL_TABLET | Freq: Every day | ORAL | 1 refills | Status: DC
Start: 2020-09-09 — End: 2021-09-11

## 2020-09-09 MED ORDER — ALBUTEROL SULFATE (2.5 MG/3ML) 0.083% IN NEBU: 2.5000 mg | INHALATION_SOLUTION | Freq: Four times a day (QID) | RESPIRATORY_TRACT | 0 refills | Status: DC | PRN

## 2020-09-09 MED ORDER — BENZONATATE 100 MG PO CAPS: 200.0000 mg | ORAL_CAPSULE | Freq: Two times a day (BID) | ORAL | 3 refills | Status: DC | PRN

## 2020-09-09 MED ORDER — NYSTATIN 100000 UNIT/GM EX CREA: 1.0000 "application " | TOPICAL_CREAM | Freq: Two times a day (BID) | CUTANEOUS | 0 refills | Status: DC

## 2020-09-09 MED ORDER — SUCRALFATE 1 G PO TABS: ORAL_TABLET | ORAL | 2 refills | Status: DC

## 2020-09-09 NOTE — Progress Notes (Signed)
[facility] [address] [place], [place] [address]  Internal MEDICINE  Office Visit Note  Patient Name: Vanessa Evans  03/02/1946  [id number]  Date of Service: 09/13/2020  Chief Complaint  Patient presents with  . Medicare Wellness  . Hypertension  . Hyperlipidemia  . Asthma  . Depression     HPI Pt is here for routine health maintenance examination She was dx with wet macular degeneration and had her first injection this week. She monitors her BP at home regularly and has been getting a few higher numbers lately. She reports 135-160 systolic over 78 to 86 dialstolic. She is only on diltiazem. BP mildly elevated in office today. She sees cardiology next month and plans to address this with them and will continue to monitor it closely. She is taking stool softener at home to help with constipation as needed. She is up to date on colonoscopy done in 2021. She is due for BMD. She reports her asthma is well controlled. Her depression is also well controlled on citalopram.   Current Medication: Outpatient Encounter Medications as of 09/09/2020  Medication Sig  . acetaminophen (TYLENOL) 500 MG tablet Take 1,000 mg by mouth 2 (two) times daily as needed for moderate pain.  . albuterol (VENTOLIN HFA) 108 (90 Base) MCG/ACT inhaler Inhale 2 puffs into the lungs every 4 (four) hours as needed for wheezing or shortness of breath.  . aspirin EC 81 MG tablet Take 81 mg by mouth at bedtime.   . BLACK ELDERBERRY PO Take 1 Dose by mouth 2 (two) times a day.  . Calcium Carbonate-Vitamin D 600-400 MG-UNIT tablet Take 1 tablet by mouth 2 (two) times daily.  . cholecalciferol (VITAMIN D) 1000 UNITS tablet Take 1,000 Units by mouth at bedtime.   . cyanocobalamin 500 MCG tablet Take 500 mcg by mouth daily.  . Dextran 70-Hypromellose (ARTIFICIAL TEARS) 0.1-0.3 % SOLN Apply 1-2 drops to eye daily.  . diclofenac Sodium (VOLTAREN) 1 % GEL Apply 4 g topically 4 (four) times daily.  .  diltiazem (CARDIZEM CD) 120 MG 24 hr capsule Take 120 mg by mouth at bedtime.   . diltiazem (TIAZAC) 120 MG 24 hr capsule Take 1 capsule by mouth daily.  . diphenhydrAMINE (BENADRYL) 25 MG tablet Take 25-50 mg by mouth daily as needed for itching.  . docusate sodium (COLACE) 100 MG capsule Take 200 mg by mouth at bedtime as needed for moderate constipation.   . EPINEPHrine (EPIPEN 2-PAK) 0.3 mg/0.3 mL IJ SOAJ injection use as directed for severe allergy reaction  . fenofibrate 160 MG tablet take1 tab po daily for chol  . folic acid (FOLVITE) 400 MCG tablet Take 400 mcg by mouth daily.  . gabapentin (NEURONTIN) 100 MG capsule Take 100 mg by mouth 2 (two) times daily.  . guaiFENesin (MUCINEX) 600 MG 12 hr tablet Take 600 mg by mouth at bedtime as needed (congestion).  . Hypromellose (ARTIFICIAL TEARS OP) Place 1-2 drops into both eyes daily as needed (dry eyes).  . ketotifen (ZADITOR) 0.025 % ophthalmic solution Place 1 drop into both eyes 2 (two) times daily as needed (allergies).  . levocetirizine (XYZAL) 5 MG tablet Take 1 tablet (5 mg total) by mouth every evening.  . levothyroxine (SYNTHROID, LEVOTHROID) 50 MCG tablet Take 50 mcg by mouth daily before breakfast. Brand Name only  . Magnesium 250 MG TABS Take 1 tablet by mouth daily.  . Melatonin 5 MG TABS Take 1 tablet by mouth at bedtime. For sleep  .   Multiple Vitamins-Minerals (MULTIVITAMIN WITH MINERALS) tablet Take 1 tablet by mouth daily.  Marland Kitchen nystatin cream (MYCOSTATIN) Apply 1 application topically 2 (two) times daily.  . OXYGEN Inhale into the lungs. 2 litre at night  . pantoprazole (PROTONIX) 40 MG tablet Take 1 tablet (40 mg total) by mouth 2 (two) times daily.  Marland Kitchen pyridoxine (B-6) 100 MG tablet Take 100 mg by mouth daily.  . sodium chloride (OCEAN) 0.65 % SOLN nasal spray Place 1 spray into both nostrils 2 (two) times daily.  . sucralfate (CARAFATE) 1 g tablet Take 3 times daily with meals.  . Vitamin A 7.5 MG (25000 UT) CAPS Take 1  tablet by mouth 2 (two) times daily.  Marland Kitchen zinc gluconate 50 MG tablet Take 50 mg by mouth daily.  . [DISCONTINUED] albuterol (PROVENTIL) (2.5 MG/3ML) 0.083% nebulizer solution Take 3 mLs (2.5 mg total) by nebulization every 6 (six) hours as needed for wheezing or shortness of breath.  . [DISCONTINUED] benzonatate (TESSALON) 100 MG capsule Take 2 capsules (200 mg total) by mouth 2 (two) times daily as needed for cough.  . [DISCONTINUED] citalopram (CELEXA) 10 MG tablet Take 1 tablet (10 mg total) by mouth daily.  . [DISCONTINUED] fluticasone (FLONASE) 50 MCG/ACT nasal spray Use 2 sprays in each nostril daily,  . [DISCONTINUED] sucralfate (CARAFATE) 1 GM/10ML suspension Take 10 mLs (1 g total) by mouth 3 (three) times daily before meals.  . benzonatate (TESSALON) 100 MG capsule Take 2 capsules (200 mg total) by mouth 2 (two) times daily as needed for cough.  . citalopram (CELEXA) 10 MG tablet Take 1 tablet (10 mg total) by mouth daily.  . [DISCONTINUED] albuterol (PROVENTIL) (2.5 MG/3ML) 0.083% nebulizer solution Take 3 mLs (2.5 mg total) by nebulization every 6 (six) hours as needed for wheezing or shortness of breath.  . [DISCONTINUED] diazepam (VALIUM) 5 MG tablet Take 1 tablet po one time two hours prior to procedure. May repeat dose prior to leaving home for procedure if symptoms are persistent. Patient must have driver to and from procedure.  . [DISCONTINUED] ELDERBERRY PO Take 1 tablet by mouth 2 (two) times daily.  . [DISCONTINUED] Phenylephrine-guaiFENesin 10-400 MG TABS Take 1 tablet by mouth every 4 (four) hours as needed (congestion).  . [DISCONTINUED] Specialty Vitamins Products (ECHINACEA C COMPLETE PO) Take by mouth.   No facility-administered encounter medications on file as of 09/09/2020.    Surgical History: Past Surgical History:  Procedure Laterality Date  . 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 BIOPSY THYROID  2018  .  bladder biopsies  2003   9 biopsies done by dr. cope.  all benign.  inflammatory process going on in bladder  . BREAST BIOPSY Right    benign  . BREAST SURGERY Right    lumpectomy  . CATARACT EXTRACTION W/PHACO Left 12/25/2014   Procedure: CATARACT EXTRACTION PHACO AND INTRAOCULAR LENS PLACEMENT (IOC);  Surgeon: Birder Robson, MD;  Location: [facility] ORS;  Service: Ophthalmology;  Laterality: Left;  [place] 00:32 AP% 20.0 CDE 6.49  . COLONOSCOPY W/ BIOPSIES    . COLONOSCOPY W/ POLYPECTOMY  2002, 2004, 2005   adenomatous polyps removed  . COLONOSCOPY WITH PROPOFOL N/A 08/23/2017   Procedure: COLONOSCOPY WITH PROPOFOL;  Surgeon: Manya Silvas, MD;  Location: [facility];  Service: Endoscopy;  Laterality: N/A;  . COLONOSCOPY WITH PROPOFOL N/A 11/08/2017   Procedure: COLONOSCOPY WITH PROPOFOL;  Surgeon: Manya Silvas, MD;  Location: [facility] ENDOSCOPY;  Service: Endoscopy;  Laterality: N/A;  . CYSTOCELE REPAIR N/A 04/09/2016   Procedure: ANTERIOR REPAIR (CYSTOCELE);  Surgeon: Gae Dry, MD;  Location: [facility] ORS;  Service: Gynecology;  Laterality: N/A;  . DIAGNOSTIC LAPAROSCOPY  2008   removed both ovaries with cysts, tubes and fibroids  . DILATION AND CURETTAGE OF UTERUS  1973  . ESOPHAGOGASTRODUODENOSCOPY (EGD) WITH PROPOFOL N/A 08/23/2017   Procedure: ESOPHAGOGASTRODUODENOSCOPY (EGD) WITH PROPOFOL;  Surgeon: Manya Silvas, MD;  [facility];  Service: Endoscopy;  Laterality: N/A;  . EYE SURGERY Bilateral 2016  . HERNIA REPAIR Right 2008   Inguinal hernia Repair, ventral hernia repair  . JOINT REPLACEMENT Right 2008   knee  . KNEE ARTHROSCOPY Right   . LUMBAR LAMINECTOMY/DECOMPRESSION MICRODISCECTOMY Right 03/21/2013   Procedure: Right Lumbar five-Sacral one Laminectomy for Synovial Cyst;  Surgeon: Faythe Ghee, MD;  Location: [facility] NEURO ORS;  Service: Neurosurgery;  Laterality: Right;  right  . OOPHORECTOMY    . REVERSE SHOULDER ARTHROPLASTY Right 02/22/2018    Procedure: REVERSE SHOULDER ARTHROPLASTY;  Surgeon: Corky Mull, MD;  Location: [facility] ORS;  Service: Orthopedics;  Laterality: Right;  . TUBAL LIGATION    . UNILATERAL SALPINGECTOMY Left 04/09/2016   Procedure: UNILATERAL SALPINGECTOMY;  Surgeon: Gae Dry, MD;  Location: [facility] ORS;  Service: Gynecology;  Laterality: Left;  . UPPER GI ENDOSCOPY  2010   with biopsy of gastric erosion  . VAGINAL HYSTERECTOMY N/A 04/09/2016   Procedure: HYSTERECTOMY VAGINAL;  Surgeon: Gae Dry, MD;  Location: [facility] ORS;  Service: Gynecology;  Laterality: N/A;  . VAGINAL HYSTERECTOMY  2017   and bladder tack    Medical History: Past Medical History:  Diagnosis Date  . Arthritis    osteo  . Asthma    needs rescue inhaler few times a year  . Claustrophobia 04/21/2020  . Depression   . Dizziness    light headed spells but it has been a while  . Dysrhythmia    tachycardia.(cardizem). brady during procedures  . GERD (gastroesophageal reflux disease)    gastritis  . Headache(784.0)   . Heart murmur 2019   aortic. dr. Nehemiah Massed not worried about this  . Hyperlipidemia   . Hypertension   . Hypothyroidism   . Migraine   . Orthopnea   . Pneumonia    long time ago (greater than 5 years ago)  . PONV (postoperative nausea and vomiting)    very anxious during cataract surgery. brady during colonoscopy  . Shortness of breath    any exertion, cannot lie flat    Family History: Family History  Problem Relation Age of Onset  . Pulmonary fibrosis Mother   . Melanoma Father   . Cardiomyopathy Sister        and arrhythmia  . Breast cancer Cousin        paternal 1st      Review of Systems  Constitutional: Negative for chills, fatigue and unexpected weight change.  HENT: Negative for congestion, postnasal drip, rhinorrhea, sneezing and sore throat.   Eyes: Negative for redness.  Respiratory: Negative for cough, chest tightness and shortness of breath.   Cardiovascular: Negative for chest  pain and palpitations.  Gastrointestinal: Negative for abdominal pain, constipation, diarrhea, nausea and vomiting.  Genitourinary: Negative for dysuria and frequency.  Musculoskeletal: Negative for arthralgias, back pain, joint swelling and neck pain.  Skin: Negative for rash.  Neurological: Negative.  Negative for tremors and numbness.  Hematological: Negative for adenopathy. Does  not bruise/bleed easily.  Psychiatric/Behavioral: Negative for behavioral problems (Depression), sleep disturbance and suicidal ideas. The patient is not nervous/anxious.      Vital Signs: BP (!) 142/70   Pulse 75   Temp 97.8 F (36.6 C)   Resp 16   Ht 5' (1.524 m)   Wt 149 lb (67.6 kg)   SpO2 97%   BMI 29.10 kg/m    Physical Exam Constitutional:      General: She is not in acute distress.    Appearance: She is well-developed. She is not diaphoretic.  HENT:     Head: Normocephalic and atraumatic.     Right Ear: External ear normal.     Left Ear: External ear normal.     Nose: Nose normal.     Mouth/Throat:     Pharynx: No oropharyngeal exudate.  Eyes:     General: No scleral icterus.       Right eye: No discharge.        Left eye: No discharge.     Conjunctiva/sclera: Conjunctivae normal.     Pupils: Pupils are equal, round, and reactive to light.  Neck:     Thyroid: No thyromegaly.     Vascular: No JVD.     Trachea: No tracheal deviation.  Cardiovascular:     Rate and Rhythm: Normal rate and regular rhythm.     Heart sounds: Normal heart sounds. No murmur heard. No friction rub. No gallop.   Pulmonary:     Effort: Pulmonary effort is normal. No respiratory distress.     Breath sounds: Normal breath sounds. No stridor. No wheezing or rales.  Chest:     Chest wall: No tenderness.  Breasts:     Right: Normal.     Left: Normal.    Abdominal:     General: Bowel sounds are normal. There is no distension.     Palpations: Abdomen is soft. There is no mass.     Tenderness: There is no  abdominal tenderness. There is no guarding or rebound.  Musculoskeletal:        General: No tenderness or deformity. Normal range of motion.     Cervical back: Normal range of motion and neck supple.  Lymphadenopathy:     Cervical: No cervical adenopathy.  Skin:    General: Skin is warm and dry.     Coloration: Skin is not pale.     Findings: No erythema or rash.  Neurological:     Mental Status: She is alert.     Cranial Nerves: No cranial nerve deficit.     Motor: No abnormal muscle tone.     Coordination: Coordination normal.     Deep Tendon Reflexes: Reflexes are normal and symmetric.  Psychiatric:        Behavior: Behavior normal.        Thought Content: Thought content normal.        Judgment: Judgment normal.      LABS: Recent Results (from the past 2160 hour(s))  Comprehensive metabolic panel     Status: Abnormal   Collection Time: 08/23/20  9:07 AM  Result Value Ref Range   Glucose 103 (H) 65 - 99 mg/dL   BUN 17 8 - 27 mg/dL   Creatinine, Ser 0.71 0.57 - 1.00 mg/dL   GFR calc non Af Amer 84 >59 mL/min/1.73   GFR calc Af Amer 96 >59 mL/min/1.73    Comment: **In accordance with recommendations from the NKF-ASN Task force,**   [facility]   is in the process of updating its eGFR calculation to the   2021 CKD-EPI creatinine equation that estimates kidney function   without a race variable.    BUN/Creatinine Ratio 24 12 - 28   Sodium 138 134 - 144 mmol/L   Potassium 4.2 3.5 - 5.2 mmol/L   Chloride 98 96 - 106 mmol/L   CO2 22 20 - 29 mmol/L   Calcium 9.7 8.7 - 10.3 mg/dL   Total Protein 6.7 6.0 - 8.5 g/dL   Albumin 4.4 3.7 - 4.7 g/dL   Globulin, Total 2.3 1.5 - 4.5 g/dL   Albumin/Globulin Ratio 1.9 1.2 - 2.2   Bilirubin Total 0.3 0.0 - 1.2 mg/dL   Alkaline Phosphatase 66 44 - 121 IU/L    Comment:               **Please note reference interval change**   AST 23 0 - 40 IU/L   ALT 26 0 - 32 IU/L  CBC     Status: None   Collection Time: 08/23/20  9:07 AM  Result Value  Ref Range   WBC 4.6 3.4 - 10.8 x10E3/uL   RBC 4.25 3.77 - 5.28 x10E6/uL   Hemoglobin 12.1 11.1 - 15.9 g/dL   Hematocrit 36.3 34.0 - 46.6 %   MCV 85 79 - 97 fL   MCH 28.5 26.6 - 33.0 pg   MCHC 33.3 31.5 - 35.7 g/dL   RDW 12.2 11.7 - 15.4 %   Platelets 331 150 - 450 x10E3/uL  Lipid Panel With LDL/HDL Ratio     Status: Abnormal   Collection Time: 08/23/20  9:07 AM  Result Value Ref Range   Cholesterol, Total 172 100 - 199 mg/dL   Triglycerides 166 (H) 0 - 149 mg/dL   HDL 48 >39 mg/dL   VLDL Cholesterol Cal 29 5 - 40 mg/dL   LDL Chol Calc (NIH) 95 0 - 99 mg/dL   LDL/HDL Ratio 2.0 0.0 - 3.2 ratio    Comment:                                     LDL/HDL Ratio                                             Men  Women                               1/2 Avg.Risk  1.0    1.5                                   Avg.Risk  3.6    3.2                                2X Avg.Risk  6.2    5.0                                3X Avg.Risk  8.0    6.1   Iron and TIBC     Status: None   Collection   Time: 08/23/20  9:07 AM  Result Value Ref Range   Total Iron Binding Capacity 426 250 - 450 ug/dL   UIBC 363 118 - 369 ug/dL   Iron 63 27 - 139 ug/dL   Iron Saturation 15 15 - 55 %  B12 and Folate Panel     Status: None   Collection Time: 08/23/20  9:07 AM  Result Value Ref Range   Vitamin B-12 1,148 232 - 1,245 pg/mL   Folate >20.0 >3.0 ng/mL    Comment: A serum folate concentration of less than 3.1 ng/mL is considered to represent clinical deficiency.   HCV Ab w Reflex to Quant PCR     Status: None   Collection Time: 08/23/20  9:07 AM  Result Value Ref Range   HCV Ab <0.1 0.0 - 0.9 s/co ratio  Interpretation:     Status: None   Collection Time: 08/23/20  9:07 AM  Result Value Ref Range   HCV Interp 1: Comment     Comment: Negative Not infected with HCV, unless recent infection is suspected or other evidence exists to indicate HCV infection.   VITAMIN D 25 Hydroxy (Vit-D Deficiency, Fractures)      Status: None   Collection Time: 08/23/20  9:07 AM  Result Value Ref Range   Vit D, 25-Hydroxy 68.4 30.0 - 100.0 ng/mL    Comment: Vitamin D deficiency has been defined by the [place] practice guideline as a level of serum 25-OH vitamin D less than 20 ng/mL (1,2). The Endocrine Society went on to further define vitamin D insufficiency as a level between 21 and 29 ng/mL (2). 1. IOM (Institute of Medicine). 2010. Dietary reference    intakes for calcium and D. [place]: The    [employer]. 2. Holick MF, Binkley [place], Bischoff-Ferrari HA, et al.    Evaluation, treatment, and prevention of vitamin D    deficiency: an Endocrine Society clinical practice    guideline. JCEM. 2011 Jul; 96(7):1911-30.   Ferritin     Status: None   Collection Time: 08/23/20  9:07 AM  Result Value Ref Range   Ferritin 83 15 - 150 ng/mL  UA/M w/rflx Culture, Routine     Status: Abnormal   Collection Time: 09/09/20 10:11 AM   Specimen: Urine   Urine  Result Value Ref Range   Specific Gravity, UA 1.006 1.005 - 1.030   pH, UA 7.0 5.0 - 7.5   Color, UA Yellow Yellow   Appearance Ur Clear Clear   Leukocytes,UA 1+ (A) Negative   Protein,UA Negative Negative/Trace   Glucose, UA Negative Negative   Ketones, UA Negative Negative   RBC, UA Negative Negative   Bilirubin, UA Negative Negative   Urobilinogen, Ur 0.2 0.2 - 1.0 mg/dL   Nitrite, UA Negative Negative   Microscopic Examination See below:     Comment: Microscopic was indicated and was performed.   Urinalysis Reflex Comment     Comment: This specimen has reflexed to a Urine Culture.  Microscopic Examination     Status: Abnormal   Collection Time: 09/09/20 10:11 AM   Urine  Result Value Ref Range   WBC, UA 0-5 0 - 5 /hpf   RBC 3-10 (A) 0 - 2 /hpf   Epithelial Cells (non renal) 0-10 0 - 10 /hpf   Casts None seen None seen /lpf   Bacteria, UA None seen None seen/Few  Urine Culture, Reflex      Status: Abnormal  Collection Time: 09/09/20 10:11 AM   Urine  Result Value Ref Range   Urine Culture, Routine Final report (A)    Organism ID, Bacteria Comment (A)     Comment: Pseudomonas aeruginosa Greater than 100,000 colony forming units per mL    ORGANISM ID, BACTERIA Comment     Comment: Mixed urogenital flora 25,000-50,000 colony forming units per mL    Antimicrobial Susceptibility Comment     Comment:       ** S = Susceptible; I = Intermediate; R = Resistant **                    P = Positive; N = Negative             MICS are expressed in micrograms per mL    Antibiotic                 RSLT#1    RSLT#2    RSLT#3    RSLT#4 Amikacin                       S Cefepime                       S Ceftazidime                    S Ciprofloxacin                  S Gentamicin                     S Imipenem                       S Levofloxacin                   S Meropenem                      S Piperacillin                   S Ticarcillin                    S Tobramycin                     S         Assessment/Plan: 1. Encounter for general adult medical examination with abnormal findings Reviewed routine labs - normal except for mildly elevated TG. She will continue fenofibrate. She is up to date on colonoscopy. - DG Bone Density; Future  2. Benign essential HTN Mildly elevated in office and pt reports some elevated readings at home. She will continue to monitor closely and conitinue diltiazem. She follows up with cardiology this month and wants to wait and address it with them.  3. Mild intermittent asthma without complication Stable. Continue current medication as needed. - albuterol (PROVENTIL) (2.5 MG/3ML) 0.083% nebulizer solution; Take 3 mLs (2.5 mg total) by nebulization every 6 (six) hours as needed for wheezing or shortness of breath.  Dispense: 75 mL; Refill: 0  4. Moderate episode of recurrent major depressive disorder (HCC) Well controlled. Continue  Celexa. - citalopram (CELEXA) 10 MG tablet; Take 1 tablet (10 mg total) by mouth daily.  Dispense: 90 tablet; Refill: 1  5. Candidiasis Rash under breasts. Will start Nystatin to be applied to the affected area. - nystatin cream (MYCOSTATIN);   Apply 1 application topically 2 (two) times daily.  Dispense: 30 g; Refill: 0  6. Cough - benzonatate (TESSALON) 100 MG capsule; Take 2 capsules (200 mg total) by mouth 2 (two) times daily as needed for cough.  Dispense: 60 capsule; Refill: 3  7. Osteoporosis, post-menopausal Will go for BMD. - DG Bone Density; Future  8. Chronic gastritis without bleeding, unspecified gastritis type - sucralfate (CARAFATE) 1 g tablet; Take 3 times daily with meals.  Dispense: 90 tablet; Refill: 2  9. Dysuria - UA/M w/rflx Culture, Routine - Microscopic Examination - Urine Culture, Reflex  General Counseling: Vanessa Evans verbalizes understanding of the findings of todays visit and agrees with plan of treatment. I have discussed any further diagnostic evaluation that may be needed or ordered today. We also reviewed her medications today. she has been encouraged to call the office with any questions or concerns that should arise related to todays visit.    Counseling:    Orders Placed This Encounter  Procedures  . Microscopic Examination  . Urine Culture, Reflex  . DG Bone Density  . UA/M w/rflx Culture, Routine    Meds ordered this encounter  Medications  . nystatin cream (MYCOSTATIN)    Sig: Apply 1 application topically 2 (two) times daily.    Dispense:  30 g    Refill:  0  . citalopram (CELEXA) 10 MG tablet    Sig: Take 1 tablet (10 mg total) by mouth daily.    Dispense:  90 tablet    Refill:  1    Wean off duloxetine. Causing severe constipation.  . sucralfate (CARAFATE) 1 g tablet    Sig: Take 3 times daily with meals.    Dispense:  90 tablet    Refill:  2  . DISCONTD: albuterol (PROVENTIL) (2.5 MG/3ML) 0.083% nebulizer solution    Sig: Take  3 mLs (2.5 mg total) by nebulization every 6 (six) hours as needed for wheezing or shortness of breath.    Dispense:  75 mL    Refill:  0  . benzonatate (TESSALON) 100 MG capsule    Sig: Take 2 capsules (200 mg total) by mouth 2 (two) times daily as needed for cough.    Dispense:  60 capsule    Refill:  3    Total time spent:30 Minutes  Time spent includes review of chart, medications, test results, and follow up plan with the patient.     Fozia M Khan, MD  Internal Medicine  

## 2020-09-10 ENCOUNTER — Other Ambulatory Visit: Payer: Self-pay

## 2020-09-10 DIAGNOSIS — J4520 Mild intermittent asthma, uncomplicated: Secondary | ICD-10-CM

## 2020-09-10 MED ORDER — ALBUTEROL SULFATE (2.5 MG/3ML) 0.083% IN NEBU: 2.5000 mg | INHALATION_SOLUTION | Freq: Four times a day (QID) | RESPIRATORY_TRACT | 0 refills | Status: DC | PRN

## 2020-09-11 ENCOUNTER — Other Ambulatory Visit: Payer: Self-pay

## 2020-09-11 ENCOUNTER — Telehealth: Payer: Self-pay

## 2020-09-11 DIAGNOSIS — J4520 Mild intermittent asthma, uncomplicated: Secondary | ICD-10-CM

## 2020-09-11 DIAGNOSIS — J301 Allergic rhinitis due to pollen: Secondary | ICD-10-CM

## 2020-09-11 MED ORDER — ALBUTEROL SULFATE (2.5 MG/3ML) 0.083% IN NEBU: 2.5000 mg | INHALATION_SOLUTION | Freq: Four times a day (QID) | RESPIRATORY_TRACT | 0 refills | Status: DC | PRN

## 2020-09-11 MED ORDER — PREDNISONE 10 MG PO TABS: ORAL_TABLET | ORAL | 0 refills | Status: DC

## 2020-09-11 MED ORDER — FLUTICASONE PROPIONATE 50 MCG/ACT NA SUSP: NASAL | 0 refills | Status: DC

## 2020-09-11 MED ORDER — AMOXICILLIN-POT CLAVULANATE 875-125 MG PO TABS: 1.0000 | ORAL_TABLET | Freq: Two times a day (BID) | ORAL | 0 refills | Status: DC

## 2020-09-11 NOTE — Telephone Encounter (Signed)
Pt advised she can  Call [facility] for monoclonal antibody

## 2020-09-11 NOTE — Telephone Encounter (Signed)
Pt called that she tested positive for covid and congested and sinus infection and low grade fever as per  Dr Humphrey Rolls advised pt that we send antibiotic and prednisone and used her neb and inhaler and drink plenty of water and rest and call us back

## 2020-09-13 ENCOUNTER — Telehealth: Payer: Self-pay

## 2020-09-13 LAB — MICROSCOPIC EXAMINATION
Bacteria, UA: NONE SEEN
Casts: NONE SEEN /lpf

## 2020-09-13 LAB — UA/M W/RFLX CULTURE, ROUTINE
Bilirubin, UA: NEGATIVE
Glucose, UA: NEGATIVE
Ketones, UA: NEGATIVE
Nitrite, UA: NEGATIVE
Protein,UA: NEGATIVE
RBC, UA: NEGATIVE
Specific Gravity, UA: 1.006 (ref 1.005–1.030)
Urobilinogen, Ur: 0.2 mg/dL (ref 0.2–1.0)
pH, UA: 7 (ref 5.0–7.5)

## 2020-09-13 LAB — URINE CULTURE, REFLEX

## 2020-09-13 NOTE — Telephone Encounter (Signed)
Pt advised that UTI and advised augmentin should covered UTI

## 2020-09-20 ENCOUNTER — Other Ambulatory Visit: Payer: Self-pay

## 2020-09-20 ENCOUNTER — Telehealth: Payer: Self-pay

## 2020-09-20 DIAGNOSIS — J4520 Mild intermittent asthma, uncomplicated: Secondary | ICD-10-CM

## 2020-09-20 MED ORDER — PREDNISONE 10 MG PO TABS: ORAL_TABLET | ORAL | 0 refills | Status: DC

## 2020-09-20 MED ORDER — ALBUTEROL SULFATE (2.5 MG/3ML) 0.083% IN NEBU: 2.5000 mg | INHALATION_SOLUTION | Freq: Four times a day (QID) | RESPIRATORY_TRACT | 3 refills | Status: DC | PRN

## 2020-09-20 MED ORDER — AMOXICILLIN-POT CLAVULANATE 875-125 MG PO TABS: 1.0000 | ORAL_TABLET | Freq: Two times a day (BID) | ORAL | 0 refills | Status: DC

## 2020-09-20 NOTE — Telephone Encounter (Signed)
Pt called that she still  Coughing and wheezing and bronchospasm as per dr Humphrey Rolls send another round antibiotic and prednisone and advised her used neb every 4 to 6 hrs as needed for wheezing

## 2020-09-30 DIAGNOSIS — Z20822 Contact with and (suspected) exposure to covid-19: Secondary | ICD-10-CM | POA: Diagnosis not present

## 2020-09-30 DIAGNOSIS — Z03818 Encounter for observation for suspected exposure to other biological agents ruled out: Secondary | ICD-10-CM | POA: Diagnosis not present

## 2020-10-03 ENCOUNTER — Encounter: Payer: Self-pay | Admitting: Internal Medicine

## 2020-10-03 ENCOUNTER — Ambulatory Visit (INDEPENDENT_AMBULATORY_CARE_PROVIDER_SITE_OTHER): Payer: Medicare Other | Admitting: Internal Medicine

## 2020-10-03 ENCOUNTER — Other Ambulatory Visit: Payer: Self-pay

## 2020-10-03 VITALS — BP 138/70 | HR 89 | Temp 98.0°F | Resp 16 | Ht 60.0 in | Wt 151.0 lb

## 2020-10-03 DIAGNOSIS — I2510 Atherosclerotic heart disease of native coronary artery without angina pectoris: Secondary | ICD-10-CM

## 2020-10-03 DIAGNOSIS — U071 COVID-19: Secondary | ICD-10-CM | POA: Diagnosis not present

## 2020-10-03 DIAGNOSIS — R300 Dysuria: Secondary | ICD-10-CM

## 2020-10-03 DIAGNOSIS — I2584 Coronary atherosclerosis due to calcified coronary lesion: Secondary | ICD-10-CM | POA: Diagnosis not present

## 2020-10-03 DIAGNOSIS — J849 Interstitial pulmonary disease, unspecified: Secondary | ICD-10-CM

## 2020-10-03 DIAGNOSIS — Z9981 Dependence on supplemental oxygen: Secondary | ICD-10-CM | POA: Diagnosis not present

## 2020-10-03 DIAGNOSIS — J4520 Mild intermittent asthma, uncomplicated: Secondary | ICD-10-CM | POA: Diagnosis not present

## 2020-10-03 DIAGNOSIS — R059 Cough, unspecified: Secondary | ICD-10-CM | POA: Diagnosis not present

## 2020-10-03 LAB — POCT URINALYSIS DIPSTICK
Bilirubin, UA: NEGATIVE
Blood, UA: NEGATIVE
Glucose, UA: NEGATIVE
Ketones, UA: NEGATIVE
Leukocytes, UA: NEGATIVE
Nitrite, UA: NEGATIVE
Protein, UA: NEGATIVE
Spec Grav, UA: 1.01 (ref 1.010–1.025)
Urobilinogen, UA: 0.2 E.U./dL
pH, UA: 6 (ref 5.0–8.0)

## 2020-10-03 NOTE — Patient Instructions (Signed)
Chronic Obstructive Pulmonary Disease  Chronic obstructive pulmonary disease (COPD) is a long-term (chronic) lung problem. When you have COPD, it is hard for air to get in and out of your lungs. Usually the condition gets worse over time, and your lungs will never return to normal. There are things you can do to keep yourself as healthy as possible. What are the causes?  Smoking. This is the most common cause.  Certain genes passed from parent to child (inherited). What increases the risk?  Being exposed to secondhand smoke from cigarettes, pipes, or cigars.  Being exposed to chemicals and other irritants, such as fumes and dust in the work environment.  Having chronic lung conditions or infections. What are the signs or symptoms?  Shortness of breath, especially during physical activity.  A long-term cough with a large amount of thick mucus. Sometimes, the cough may not have any mucus (dry cough).  Wheezing.  Breathing quickly.  Skin that looks gray or blue, especially in the fingers, toes, or lips.  Feeling tired (fatigue).  Weight loss.  Chest tightness.  Having infections often.  Episodes when breathing symptoms become much worse (exacerbations). At the later stages of this disease, you may have swelling in the ankles, feet, or legs. How is this treated?  Taking medicines.  Quitting smoking, if you smoke.  Rehabilitation. This includes steps to make your body work better. It may involve a team of specialists.  Doing exercises.  Making changes to your diet.  Using oxygen.  Lung surgery.  Lung transplant.  Comfort measures (palliative care). Follow these instructions at home: Medicines  Take over-the-counter and prescription medicines only as told by your doctor.  Talk to your doctor before taking any cough or allergy medicines. You may need to avoid medicines that cause your lungs to be dry. Lifestyle  If you smoke, stop smoking. Smoking makes the  problem worse.  Do not smoke or use any products that contain nicotine or tobacco. If you need help quitting, ask your doctor.  Avoid being around things that make your breathing worse. This may include smoke, chemicals, and fumes.  Stay active, but remember to rest as well.  Learn and use tips on how to manage stress and control your breathing.  Make sure you get enough sleep. Most adults need at least 7 hours of sleep every night.  Eat healthy foods. Eat smaller meals more often. Rest before meals. Controlled breathing Learn and use tips on how to control your breathing as told by your doctor. Try:  Breathing in (inhaling) through your nose for 1 second. Then, pucker your lips and breath out (exhale) through your lips for 2 seconds.  Putting one hand on your belly (abdomen). Breathe in slowly through your nose for 1 second. Your hand on your belly should move out. Pucker your lips and breathe out slowly through your lips. Your hand on your belly should move in as you breathe out.   Controlled coughing Learn and use controlled coughing to clear mucus from your lungs. Follow these steps: 1. Lean your head a little forward. 2. Breathe in deeply. 3. Try to hold your breath for 3 seconds. 4. Keep your mouth slightly open while coughing 2 times. 5. Spit any mucus out into a tissue. 6. Rest and do the steps again 1 or 2 times as needed. General instructions  Make sure you get all the shots (vaccines) that your doctor recommends. Ask your doctor about a flu shot and a pneumonia shot.    Use oxygen therapy and pulmonary rehabilitation if told by your doctor. If you need home oxygen therapy, ask your doctor if you should buy a tool to measure your oxygen level (oximeter).  Make a COPD action plan with your doctor. This helps you to know what to do if you feel worse than usual.  Manage any other conditions you have as told by your doctor.  Avoid going outside when it is very hot, cold, or  humid.  Avoid people who have a sickness you can catch (contagious).  Keep all follow-up visits. Contact a doctor if:  You cough up more mucus than usual.  There is a change in the color or thickness of the mucus.  It is harder to breathe than usual.  Your breathing is faster than usual.  You have trouble sleeping.  You need to use your medicines more often than usual.  You have trouble doing your normal activities such as getting dressed or walking around the house. Get help right away if:  You have shortness of breath while resting.  You have shortness of breath that stops you from: ? Being able to talk. ? Doing normal activities.  Your chest hurts for longer than 5 minutes.  Your skin color is more blue than usual.  Your pulse oximeter shows that you have low oxygen for longer than 5 minutes.  You have a fever.  You feel too tired to breathe normally. These symptoms may represent a serious problem that is an emergency. Do not wait to see if the symptoms will go away. Get medical help right away. Call your local emergency services (911 in the [place] Do not drive yourself to the hospital. Summary  Chronic obstructive pulmonary disease (COPD) is a long-term lung problem.  The way your lungs work will never return to normal. Usually the condition gets worse over time. There are things you can do to keep yourself as healthy as possible.  Take over-the-counter and prescription medicines only as told by your doctor.  If you smoke, stop. Smoking makes the problem worse. This information is not intended to replace advice given to you by your health care provider. Make sure you discuss any questions you have with your health care provider. Document Revised: 06/04/2020 Document Reviewed: 06/04/2020 [employer] Patient Education  2021 [employer]   

## 2020-10-03 NOTE — Progress Notes (Signed)
[facility] [place], [place] [id number]  Pulmonary Sleep Medicine   Office Visit Note  Patient Name: Vanessa Evans DOB: [birth year] MRN [id number]  Date of Service: 10/03/2020  Complaints/HPI: post covid. She had milder form. She had been vaxed and boosted. She tested negative on Monday. Cough improved. Had some asthma symptoms with increasing shortness of breath cough and congestion.  She had some more wheezing noted at that time.  She states overall she is doing better.  She still is concerned about pulmonary fibrosis and now since she has had the [place] she is even further concerned that she may progress into fibrotic lung disease.  ROS  General: (-) fever, (-) chills, (-) night sweats, (-) weakness Skin: (-) rashes, (-) itching,. Eyes: (-) visual changes, (-) redness, (-) itching. Nose and Sinuses: (-) nasal stuffiness or itchiness, (-) postnasal drip, (-) nosebleeds, (-) sinus trouble. Mouth and Throat: (-) sore throat, (-) hoarseness. Neck: (-) swollen glands, (-) enlarged thyroid, (-) neck pain. Respiratory: + cough, (-) bloody sputum, + shortness of breath, - wheezing. Cardiovascular: - ankle swelling, (-) chest pain. Lymphatic: (-) lymph node enlargement. Neurologic: (-) numbness, (-) tingling. Psychiatric: (-) anxiety, (-) depression   Current Medication: Outpatient Encounter Medications as of 10/03/2020  Medication Sig  . acetaminophen (TYLENOL) 500 MG tablet Take 1,000 mg by mouth 2 (two) times daily as needed for moderate pain.  Marland Kitchen albuterol (PROVENTIL) (2.5 MG/3ML) 0.083% nebulizer solution Take 3 mLs (2.5 mg total) by nebulization every 6 (six) hours as needed for wheezing or shortness of breath.  Marland Kitchen albuterol (VENTOLIN HFA) 108 (90 Base) MCG/ACT inhaler Inhale 2 puffs into the lungs every 4 (four) hours as needed for wheezing or shortness of breath.  Marland Kitchen aspirin EC 81 MG tablet Take 81 mg by mouth at bedtime.   . benzonatate (TESSALON) 100 MG  capsule Take 2 capsules (200 mg total) by mouth 2 (two) times daily as needed for cough.  Marland Kitchen BLACK ELDERBERRY PO Take 1 Dose by mouth 2 (two) times a day.  . Calcium Carbonate-Vitamin D 600-400 MG-UNIT tablet Take 1 tablet by mouth 2 (two) times daily.  . cholecalciferol (VITAMIN D) 1000 UNITS tablet Take 1,000 Units by mouth at bedtime.   . citalopram (CELEXA) 10 MG tablet Take 1 tablet (10 mg total) by mouth daily.  . cyanocobalamin 500 MCG tablet Take 500 mcg by mouth daily.  Marland Kitchen Dextran 70-Hypromellose (ARTIFICIAL TEARS) 0.1-0.3 % SOLN Apply 1-2 drops to eye daily.  . diclofenac Sodium (VOLTAREN) 1 % GEL Apply 4 g topically 4 (four) times daily.  Marland Kitchen diltiazem (CARDIZEM CD) 120 MG 24 hr capsule Take 120 mg by mouth at bedtime.   Marland Kitchen diltiazem (TIAZAC) 120 MG 24 hr capsule Take 1 capsule by mouth daily.  . diphenhydrAMINE (BENADRYL) 25 MG tablet Take 25-50 mg by mouth daily as needed for itching.  . docusate sodium (COLACE) 100 MG capsule Take 200 mg by mouth at bedtime as needed for moderate constipation.   Marland Kitchen EPINEPHrine (EPIPEN 2-PAK) 0.3 mg/0.3 mL IJ SOAJ injection use as directed for severe allergy reaction  . fenofibrate 160 MG tablet take1 tab po daily for chol  . fluticasone (FLONASE) 50 MCG/ACT nasal spray Use 2 sprays in each nostril daily,  . folic acid (FOLVITE) 585 MCG tablet Take 400 mcg by mouth daily.  Marland Kitchen gabapentin (NEURONTIN) 100 MG capsule Take 100 mg by mouth 2 (two) times daily.  Marland Kitchen guaiFENesin (MUCINEX) 600 MG 12 hr tablet  Take 600 mg by mouth at bedtime as needed (congestion).  . Hypromellose (ARTIFICIAL TEARS OP) Place 1-2 drops into both eyes daily as needed (dry eyes).  Marland Kitchen ketotifen (ZADITOR) 0.025 % ophthalmic solution Place 1 drop into both eyes 2 (two) times daily as needed (allergies).  Marland Kitchen levocetirizine (XYZAL) 5 MG tablet Take 1 tablet (5 mg total) by mouth every evening.  Marland Kitchen levothyroxine (SYNTHROID, LEVOTHROID) 50 MCG tablet Take 50 mcg by mouth daily before breakfast.  Brand Name only  . Magnesium 250 MG TABS Take 1 tablet by mouth daily.  . Melatonin 5 MG TABS Take 1 tablet by mouth at bedtime. For sleep  . Multiple Vitamins-Minerals (MULTIVITAMIN WITH MINERALS) tablet Take 1 tablet by mouth daily.  Marland Kitchen nystatin cream (MYCOSTATIN) Apply 1 application topically 2 (two) times daily.  . OXYGEN Inhale into the lungs. 2 litre at night  . pantoprazole (PROTONIX) 40 MG tablet Take 1 tablet (40 mg total) by mouth 2 (two) times daily.  Marland Kitchen pyridoxine (B-6) 100 MG tablet Take 100 mg by mouth daily.  . sodium chloride (OCEAN) 0.65 % SOLN nasal spray Place 1 spray into both nostrils 2 (two) times daily.  . sucralfate (CARAFATE) 1 g tablet Take 3 times daily with meals.  . Vitamin A 7.5 MG (25000 UT) CAPS Take 1 tablet by mouth 2 (two) times daily.  Marland Kitchen zinc gluconate 50 MG tablet Take 50 mg by mouth daily.  . [DISCONTINUED] amoxicillin-clavulanate (AUGMENTIN) 875-125 MG tablet Take 1 tablet by mouth 2 (two) times daily. (Patient not taking: Reported on 10/03/2020)  . [DISCONTINUED] predniSONE (DELTASONE) 10 MG tablet Take 1 tab po for 3  times a day for 3 days ,then take 1 tab po 2 times a day for 3 days  and then take 1 tab a day for 3 days (Patient not taking: Reported on 10/03/2020)   No facility-administered encounter medications on file as of 10/03/2020.    Surgical History: Past Surgical History:  Procedure Laterality Date  . 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 BIOPSY THYROID  2018  . bladder biopsies  2003   9 biopsies done by dr. cope.  all benign.  inflammatory process going on in bladder  . BREAST BIOPSY Right    benign  . BREAST SURGERY Right    lumpectomy  . CATARACT EXTRACTION W/PHACO Left 12/25/2014   Procedure: CATARACT EXTRACTION PHACO AND INTRAOCULAR LENS PLACEMENT (IOC);  Surgeon: Birder Robson, MD;  Location: [facility] ORS;  Service: Ophthalmology;  Laterality: Left;  [place] 00:32 AP% 20.0 CDE  6.49  . COLONOSCOPY W/ BIOPSIES    . COLONOSCOPY W/ POLYPECTOMY  2002, 2004, 2005   adenomatous polyps removed  . COLONOSCOPY WITH PROPOFOL N/A 08/23/2017   Procedure: COLONOSCOPY WITH PROPOFOL;  Surgeon: Manya Silvas, MD;  Location: [facility] ENDOSCOPY;  Service: Endoscopy;  Laterality: N/A;  . COLONOSCOPY WITH PROPOFOL N/A 11/08/2017   Procedure: COLONOSCOPY WITH PROPOFOL;  Surgeon: Manya Silvas, MD;  Location: [facility] ENDOSCOPY;  Service: Endoscopy;  Laterality: N/A;  . CYSTOCELE REPAIR N/A 04/09/2016   Procedure: ANTERIOR REPAIR (CYSTOCELE);  Surgeon: Gae Dry, MD;  Location: [facility] ORS;  Service: Gynecology;  Laterality: N/A;  . DIAGNOSTIC LAPAROSCOPY  2008   removed both ovaries with cysts, tubes and fibroids  . DILATION AND CURETTAGE OF UTERUS  1973  . ESOPHAGOGASTRODUODENOSCOPY (EGD) WITH PROPOFOL N/A 08/23/2017   Procedure: ESOPHAGOGASTRODUODENOSCOPY (EGD) WITH PROPOFOL;  Surgeon: Scot Jun, MD;  Location: [facility] ENDOSCOPY;  Service: Endoscopy;  Laterality: N/A;  . EYE SURGERY Bilateral 2016  . HERNIA REPAIR Right 2008   Inguinal hernia Repair, ventral hernia repair  . JOINT REPLACEMENT Right 2008   knee  . KNEE ARTHROSCOPY Right   . LUMBAR LAMINECTOMY/DECOMPRESSION MICRODISCECTOMY Right 03/21/2013   Procedure: Right Lumbar five-Sacral one Laminectomy for Synovial Cyst;  Surgeon: Reinaldo Meeker, MD;  Location: [facility] NEURO ORS;  Service: Neurosurgery;  Laterality: Right;  right  . OOPHORECTOMY    . REVERSE SHOULDER ARTHROPLASTY Right 02/22/2018   Procedure: REVERSE SHOULDER ARTHROPLASTY;  Surgeon: Christena Flake, MD;  Location: [facility] ORS;  Service: Orthopedics;  Laterality: Right;  . TUBAL LIGATION    . UNILATERAL SALPINGECTOMY Left 04/09/2016   Procedure: UNILATERAL SALPINGECTOMY;  Surgeon: Nadara Mustard, MD;  Location: [facility] ORS;  Service: Gynecology;  Laterality: Left;  . UPPER GI ENDOSCOPY  2010   with biopsy of gastric erosion  . VAGINAL HYSTERECTOMY N/A 04/09/2016    Procedure: HYSTERECTOMY VAGINAL;  Surgeon: Nadara Mustard, MD;  Location: [facility] ORS;  Service: Gynecology;  Laterality: N/A;  . VAGINAL HYSTERECTOMY  2017   and bladder tack    Medical History: Past Medical History:  Diagnosis Date  . Arthritis    osteo  . Asthma    needs rescue inhaler few times a year  . Claustrophobia 04/21/2020  . Depression   . Dizziness    light headed spells but it has been a while  . Dysrhythmia    tachycardia.(cardizem). brady during procedures  . GERD (gastroesophageal reflux disease)    gastritis  . Headache(784.0)   . Heart murmur 2019   aortic. dr. Gwen Pounds not worried about this  . Hyperlipidemia   . Hypertension   . Hypothyroidism   . Migraine   . Orthopnea   . Pneumonia    long time ago (greater than 5 years ago)  . PONV (postoperative nausea and vomiting)    very anxious during cataract surgery. brady during colonoscopy  . Shortness of breath    any exertion, cannot lie flat    Family History: Family History  Problem Relation Age of Onset  . Pulmonary fibrosis Mother   . Melanoma Father   . Cardiomyopathy Sister        and arrhythmia  . Breast cancer Cousin        paternal 1st    Social History: Social History   Socioeconomic History  . Marital status: Married    Spouse name: Not on file  . Number of children: Not on file  . Years of education: Not on file  . Highest education level: Not on file  Occupational History  . Not on file  Tobacco Use  . Smoking status: Former Smoker    Types: Cigarettes    Quit date: 1967    Years since quitting: 55.1  . Smokeless tobacco: Never Used  Vaping Use  . Vaping Use: Never used  Substance and Sexual Activity  . Alcohol use: Not Currently  . Drug use: No  . Sexual activity: Not Currently  Other Topics Concern  . Not on file  Social History Narrative  . Not on file   Social Determinants of Health   Financial Resource Strain: Not on file  Food Insecurity: Not on file   Transportation Needs: Not on file  Physical Activity: Not on file  Stress: Not on file  Social Connections: Not on file  Intimate Partner  Violence: Not on file    Vital Signs: Blood pressure 138/70, pulse 89, temperature 98 F (36.7 C), resp. rate 16, height 5' (1.524 m), weight 151 lb (68.5 kg), SpO2 96 %.  Examination: General Appearance: The patient is well-developed, well-nourished, and in no distress. Skin: Gross inspection of skin unremarkable. Head: normocephalic, no gross deformities. Eyes: no gross deformities noted. ENT: ears appear grossly normal no exudates. Neck: Supple. No thyromegaly. No LAD. Respiratory: few rhonchi noted. Cardiovascular: Normal S1 and S2 without murmur or rub. Extremities: No cyanosis. pulses are equal. Neurologic: Alert and oriented. No involuntary movements.  LABS: Recent Results (from the past 2160 hour(s))  Comprehensive metabolic panel     Status: Abnormal   Collection Time: 08/23/20  9:07 AM  Result Value Ref Range   Glucose 103 (H) 65 - 99 mg/dL   BUN 17 8 - 27 mg/dL   Creatinine, Ser 0.71 0.57 - 1.00 mg/dL   GFR calc non Af Amer 84 >59 mL/min/1.73   GFR calc Af Amer 96 >59 mL/min/1.73    Comment: **In accordance with recommendations from the NKF-ASN Task force,**   [facility] is in the process of updating its eGFR calculation to the   2021 CKD-EPI creatinine equation that estimates kidney function   without a race variable.    BUN/Creatinine Ratio 24 12 - 28   Sodium 138 134 - 144 mmol/L   Potassium 4.2 3.5 - 5.2 mmol/L   Chloride 98 96 - 106 mmol/L   CO2 22 20 - 29 mmol/L   Calcium 9.7 8.7 - 10.3 mg/dL   Total Protein 6.7 6.0 - 8.5 g/dL   Albumin 4.4 3.7 - 4.7 g/dL   Globulin, Total 2.3 1.5 - 4.5 g/dL   Albumin/Globulin Ratio 1.9 1.2 - 2.2   Bilirubin Total 0.3 0.0 - 1.2 mg/dL   Alkaline Phosphatase 66 44 - 121 IU/L    Comment:               **Please note reference interval change**   AST 23 0 - 40 IU/L   ALT 26 0 - 32 IU/L   CBC     Status: None   Collection Time: 08/23/20  9:07 AM  Result Value Ref Range   WBC 4.6 3.4 - 10.8 x10E3/uL   RBC 4.25 3.77 - 5.28 x10E6/uL   Hemoglobin 12.1 11.1 - 15.9 g/dL   Hematocrit 36.3 34.0 - 46.6 %   MCV 85 79 - 97 fL   MCH 28.5 26.6 - 33.0 pg   MCHC 33.3 31.5 - 35.7 g/dL   RDW 12.2 11.7 - 15.4 %   Platelets 331 150 - 450 x10E3/uL  Lipid Panel With LDL/HDL Ratio     Status: Abnormal   Collection Time: 08/23/20  9:07 AM  Result Value Ref Range   Cholesterol, Total 172 100 - 199 mg/dL   Triglycerides 166 (H) 0 - 149 mg/dL   HDL 48 >39 mg/dL   VLDL Cholesterol Cal 29 5 - 40 mg/dL   LDL Chol Calc (NIH) 95 0 - 99 mg/dL   LDL/HDL Ratio 2.0 0.0 - 3.2 ratio    Comment:                                     LDL/HDL Ratio  Men  Women                               1/2 Avg.Risk  1.0    1.5                                   Avg.Risk  3.6    3.2                                2X Avg.Risk  6.2    5.0                                3X Avg.Risk  8.0    6.1   Iron and TIBC     Status: None   Collection Time: 08/23/20  9:07 AM  Result Value Ref Range   Total Iron Binding Capacity 426 250 - 450 ug/dL   UIBC 363 118 - 369 ug/dL   Iron 63 27 - 139 ug/dL   Iron Saturation 15 15 - 55 %  B12 and Folate Panel     Status: None   Collection Time: 08/23/20  9:07 AM  Result Value Ref Range   Vitamin B-12 1,148 232 - 1,245 pg/mL   Folate >20.0 >3.0 ng/mL    Comment: A serum folate concentration of less than 3.1 ng/mL is considered to represent clinical deficiency.   HCV Ab w Reflex to Quant PCR     Status: None   Collection Time: 08/23/20  9:07 AM  Result Value Ref Range   HCV Ab <0.1 0.0 - 0.9 s/co ratio  Interpretation:     Status: None   Collection Time: 08/23/20  9:07 AM  Result Value Ref Range   HCV Interp 1: Comment     Comment: Negative Not infected with HCV, unless recent infection is suspected or other evidence exists to indicate  HCV infection.   VITAMIN D 25 Hydroxy (Vit-D Deficiency, Fractures)     Status: None   Collection Time: 08/23/20  9:07 AM  Result Value Ref Range   Vit D, 25-Hydroxy 68.4 30.0 - 100.0 ng/mL    Comment: Vitamin D deficiency has been defined by the [place] practice guideline as a level of serum 25-OH vitamin D less than 20 ng/mL (1,2). The Endocrine Society went on to further define vitamin D insufficiency as a level between 21 and 29 ng/mL (2). 1. IOM (Institute of Medicine). 2010. Dietary reference    intakes for calcium and D. [place]: The    [employer]. 2. Holick MF, [place], Bischoff-Ferrari HA, et al.    Evaluation, treatment, and prevention of vitamin D    deficiency: an Endocrine Society clinical practice    guideline. JCEM. 2011 Jul; 96(7):1911-30.   Ferritin     Status: None   Collection Time: 08/23/20  9:07 AM  Result Value Ref Range   Ferritin 83 15 - 150 ng/mL  UA/M w/rflx Culture, Routine     Status: Abnormal   Collection Time: 09/09/20 10:11 AM   Specimen: Urine   Urine  Result Value Ref Range   Specific Gravity, UA 1.006 1.005 - 1.030   pH, UA 7.0 5.0 - 7.5   Color,  UA Yellow Yellow   Appearance Ur Clear Clear   Leukocytes,UA 1+ (A) Negative   Protein,UA Negative Negative/Trace   Glucose, UA Negative Negative   Ketones, UA Negative Negative   RBC, UA Negative Negative   Bilirubin, UA Negative Negative   Urobilinogen, Ur 0.2 0.2 - 1.0 mg/dL   Nitrite, UA Negative Negative   Microscopic Examination See below:     Comment: Microscopic was indicated and was performed.   Urinalysis Reflex Comment     Comment: This specimen has reflexed to a Urine Culture.  Microscopic Examination     Status: Abnormal   Collection Time: 09/09/20 10:11 AM   Urine  Result Value Ref Range   WBC, UA 0-5 0 - 5 /hpf   RBC 3-10 (A) 0 - 2 /hpf   Epithelial Cells (non renal) 0-10 0 - 10 /hpf   Casts None seen None seen  /lpf   Bacteria, UA None seen None seen/Few  Urine Culture, Reflex     Status: Abnormal   Collection Time: 09/09/20 10:11 AM   Urine  Result Value Ref Range   Urine Culture, Routine Final report (A)    Organism ID, Bacteria Comment (A)     Comment: Pseudomonas aeruginosa Greater than 100,000 colony forming units per mL    ORGANISM ID, BACTERIA Comment     Comment: Mixed urogenital flora 25,000-50,000 colony forming units per mL    Antimicrobial Susceptibility Comment     Comment:       ** S = Susceptible; I = Intermediate; R = Resistant **                    P = Positive; N = Negative             MICS are expressed in micrograms per mL    Antibiotic                 RSLT#1    RSLT#2    RSLT#3    RSLT#4 Amikacin                       S Cefepime                       S Ceftazidime                    S Ciprofloxacin                  S Gentamicin                     S Imipenem                       S Levofloxacin                   S Meropenem                      S Piperacillin                   S Ticarcillin                    S Tobramycin                     S     Radiology: MR LUMBAR SPINE WO CONTRAST  Result Date: 04/11/2020  CLINICAL DATA:  Chronic low back pain. Occasional pain into the right buttock. EXAM: MRI LUMBAR SPINE WITHOUT CONTRAST TECHNIQUE: Multiplanar, multisequence MR imaging of the lumbar spine was performed. No intravenous contrast was administered. COMPARISON:  MRI of the lumbar spine 09/30/2018 FINDINGS: Segmentation: 5 non rib-bearing lumbar type vertebral bodies are present. The lowest fully formed vertebral body is L5. Alignment: 8 mm anterolisthesis at L5-S1 is stable secondary to right-sided pars defect advanced facet hypertrophy on the left. No other significant listhesis is present. There is some straightening of the normal lumbar lordosis. Vertebrae: Chronic fatty endplate marrow changes are again noted at L3-4. Marrow signal and vertebral body heights are  otherwise normal. Conus medullaris and cauda equina: Conus extends to the T12-L1 level. Conus and cauda equina appear normal. Paraspinal and other soft tissues: Limited imaging the abdomen is unremarkable. There is no significant adenopathy. No solid organ lesions are present. Disc levels: T12-L1: Negative. L1-2: Negative. L2-3: Disc bulging and mild bilateral facet hypertrophy is present. New significant stenosis is present. L3-4: A broad-based disc protrusion is present. Moderate facet hypertrophy is noted. Moderate right and mild left foraminal narrowing is present. Mild right subarticular narrowing is present. L4-5: A broad-based disc protrusion is present. Moderate facet hypertrophy and ligamentum flavum thickening is noted. Mild bilateral foraminal stenosis is present. L5-S1: Moderate facet hypertrophy is present bilaterally. Dilated perineural root sleeve cyst is present on the right. Right laminectomy is noted. No significant central canal stenosis is present. Foramina are patent bilaterally. IMPRESSION: 1. Stable anterolisthesis at L5-S1 secondary to right-sided pars defect in advanced facet hypertrophy on the left. 2. Mild bilateral foraminal narrowing at L4-5. 3. Moderate right and mild left foraminal stenosis at L3-4 with mild right subarticular narrowing. Chronic endplate marrow changes are present at this level. 4. Disc bulging and mild bilateral facet hypertrophy at L2-3 without significant stenosis. Electronically Signed   By: San Morelle M.D.   On: 04/11/2020 22:31    No results found.  No results found.    Assessment and Plan: Patient Active Problem List   Diagnosis Date Noted  . Peptic ulcer disease 04/21/2020  . Claustrophobia 04/21/2020  . Urinary tract infection without hematuria 02/04/2020  . Pain in both lower extremities 12/18/2019  . Inflammatory polyarthritis ([place]) 07/26/2019  . Excessive daytime sleepiness 07/26/2019  . Encounter for screening mammogram for  malignant neoplasm of breast 07/26/2019  . Intercostal muscle pain 06/25/2019  . Positive ANA (antinuclear antibody) 05/01/2019  . Primary osteoarthritis involving multiple joints 04/26/2019  . Mild intermittent asthma without complication 99/37/1696  . Seasonal allergic rhinitis due to pollen 08/22/2018  . Chills with fever 08/11/2018  . Sore throat 08/11/2018  . Acute upper respiratory infection 08/11/2018  . Candidiasis 08/11/2018  . Iron deficiency anemia 05/05/2018  . Right hip pain 04/27/2018  . Accidental fall from furniture 04/27/2018  . Vitamin D deficiency 04/27/2018  . Cough 04/18/2018  . Dysuria 04/18/2018  . Status post reverse total shoulder replacement, right 02/22/2018  . Abnormal ECG 02/18/2018  . Pain in joint of right shoulder 02/13/2018  . Abdominal aortic atherosclerosis ([place]) 02/13/2018  . Episode of recurrent major depressive disorder ([place]) 02/13/2018  . Encounter for general adult medical examination with abnormal findings 02/13/2018  . Lipoma of right shoulder 01/31/2018  . Rotator cuff tendinitis, right 01/31/2018  . Chronic superficial gastritis without bleeding 12/12/2017  . Schatzki's ring 12/12/2017  . Acute gastritis 11/08/2017  . Nausea 10/18/2017  . Other fatigue 10/18/2017  . Allergic  rhinitis 09/20/2017  . Eczema 09/20/2017  . Hematuria 09/20/2017  . Mixed hyperlipidemia 09/20/2017  . Osteoarthritis 09/20/2017  . Osteoporosis, post-menopausal 09/20/2017  . Palpitations 07/05/2017  . Epigastric pain 05/24/2017  . Gastroesophageal reflux disease without esophagitis 05/24/2017  . Impingement syndrome of left shoulder 10/02/2016  . Trochanteric bursitis of left hip 10/02/2016  . SOB (shortness of breath) 04/16/2016  . Uterine prolapse 04/09/2016  . Cystocele 04/09/2016  . Hyponatremia 04/09/2016  . Benign essential HTN 11/18/2015  . Paroxysmal supraventricular tachycardia ([place]) 11/18/2015  . Premature ventricular contraction 07/20/2014  .  History of colonic polyps 05/29/2014  . Asthma 02/05/2014  . Chest pain 02/05/2014  . Increased frequency of urination 02/05/2014  . Tachycardia 02/05/2014  . Female stress incontinence 12/14/2013  . Gross hematuria 12/14/2013  . Incomplete emptying of bladder 12/14/2013  . Other chronic cystitis without hematuria 12/14/2013    1. Mild intermittent asthma without complication with history of ILD She had covid and also has history of asthma. She will be continued on current meds and also will need a follow up CT scan of the chest without contrast to make certain there is clearing of GGO infiltrates - 6 minute walk; Future  2. Cough Likely post COVID I think it will resolve given time.  We are going to monitor her closely.  In addition suggested a CT scan follow-up  3. COVID-19 virus infection Recovery phase appears to be doing fine she had a milder form of the disease will continue to follow along  4. Oxygen dependent 6-minute walk will be performed today for qualification - 6 minute walk; Future  General Counseling: I have discussed the findings of the evaluation and examination with Isidora.  I have also discussed any further diagnostic evaluation thatmay be needed or ordered today. Meesha verbalizes understanding of the findings of todays visit. We also reviewed her medications today and discussed drug interactions and side effects including but not limited excessive drowsiness and altered mental states. We also discussed that there is always a risk not just to her but also people around her. she has been encouraged to call the office with any questions or concerns that should arise related to todays visit.  Orders Placed This Encounter  Procedures  . CT Chest High Resolution    Standing Status:   Future    Standing Expiration Date:   10/03/2021    Order Specific Question:   Preferred imaging location?    Answer:   [facility]  . 6 minute walk    Standing Status:   Future     Standing Expiration Date:   10/03/2021    Order Specific Question:   Where should this test be performed?    Answer:   other  . Pulmonary function test    Standing Status:   Future    Standing Expiration Date:   10/03/2021    Order Specific Question:   Where should this test be performed?    Answer:   [facility]     Time spent: 35 minutes  I have personally obtained a history, examined the patient, evaluated laboratory and imaging results, formulated the assessment and plan and placed orders.    Allyne Gee, MD [facility] Pulmonary and Critical Care Sleep medicine

## 2020-10-08 DIAGNOSIS — H353211 Exudative age-related macular degeneration, right eye, with active choroidal neovascularization: Secondary | ICD-10-CM | POA: Diagnosis not present

## 2020-10-15 ENCOUNTER — Ambulatory Visit
Admission: RE | Admit: 2020-10-15 | Discharge: 2020-10-15 | Disposition: A | Discharge status: Home / Self Care | Payer: Medicare Other | Source: Ambulatory Visit | Attending: Internal Medicine | Admitting: Internal Medicine

## 2020-10-15 ENCOUNTER — Other Ambulatory Visit: Payer: Self-pay

## 2020-10-15 DIAGNOSIS — R0602 Shortness of breath: Secondary | ICD-10-CM | POA: Diagnosis not present

## 2020-10-15 DIAGNOSIS — J849 Interstitial pulmonary disease, unspecified: Secondary | ICD-10-CM

## 2020-10-16 ENCOUNTER — Ambulatory Visit (INDEPENDENT_AMBULATORY_CARE_PROVIDER_SITE_OTHER): Payer: Medicare Other | Admitting: Internal Medicine

## 2020-10-16 DIAGNOSIS — J849 Interstitial pulmonary disease, unspecified: Secondary | ICD-10-CM

## 2020-10-16 LAB — PULMONARY FUNCTION TEST

## 2020-10-17 ENCOUNTER — Encounter: Payer: Self-pay | Admitting: Internal Medicine

## 2020-10-17 ENCOUNTER — Other Ambulatory Visit: Payer: Self-pay | Admitting: Hospice and Palliative Medicine

## 2020-10-17 ENCOUNTER — Telehealth: Payer: Self-pay

## 2020-10-17 ENCOUNTER — Ambulatory Visit: Payer: Medicare Other | Admitting: Internal Medicine

## 2020-10-17 ENCOUNTER — Ambulatory Visit (INDEPENDENT_AMBULATORY_CARE_PROVIDER_SITE_OTHER): Payer: Medicare Other | Admitting: Internal Medicine

## 2020-10-17 VITALS — BP 140/78 | HR 86 | Temp 97.8°F | Resp 16 | Ht 60.0 in | Wt 152.0 lb

## 2020-10-17 DIAGNOSIS — I2510 Atherosclerotic heart disease of native coronary artery without angina pectoris: Secondary | ICD-10-CM | POA: Diagnosis not present

## 2020-10-17 DIAGNOSIS — I2584 Coronary atherosclerosis due to calcified coronary lesion: Secondary | ICD-10-CM

## 2020-10-17 DIAGNOSIS — J849 Interstitial pulmonary disease, unspecified: Secondary | ICD-10-CM

## 2020-10-17 DIAGNOSIS — U071 COVID-19: Secondary | ICD-10-CM | POA: Diagnosis not present

## 2020-10-17 DIAGNOSIS — J4520 Mild intermittent asthma, uncomplicated: Secondary | ICD-10-CM

## 2020-10-17 NOTE — Patient Instructions (Signed)
Asthma, Adult  Asthma is a long-term (chronic) condition in which the airways get tight and narrow. The airways are the breathing passages that lead from the nose and mouth down into the lungs. A person with asthma will have times when symptoms get worse. These are called asthma attacks. They can cause coughing, whistling sounds when you breathe (wheezing), shortness of breath, and chest pain. They can make it hard to breathe. There is no cure for asthma, but medicines and lifestyle changes can help control it. There are many things that can bring on an asthma attack or make asthma symptoms worse (triggers). Common triggers include:  Mold.  Dust.  Cigarette smoke.  Cockroaches.  Things that can cause allergy symptoms (allergens). These include animal skin flakes (dander) and pollen from trees or grass.  Things that pollute the air. These may include household cleaners, wood smoke, smog, or chemical odors.  Cold air, weather changes, and wind.  Crying or laughing hard.  Stress.  Certain medicines or drugs.  Certain foods such as dried fruit, potato chips, and grape juice.  Infections, such as a cold or the flu.  Certain medical conditions or diseases.  Exercise or tiring activities. Asthma may be treated with medicines and by staying away from the things that cause asthma attacks. Types of medicines may include:  Controller medicines. These help prevent asthma symptoms. They are usually taken every day.  Fast-acting reliever or rescue medicines. These quickly relieve asthma symptoms. They are used as needed and provide short-term relief.  Allergy medicines if your attacks are brought on by allergens.  Medicines to help control the body's defense (immune) system. Follow these instructions at home: Avoiding triggers in your home  Change your heating and air conditioning filter often.  Limit your use of fireplaces and wood stoves.  Get rid of pests (such as roaches and  mice) and their droppings.  Throw away plants if you see mold on them.  Clean your floors. Dust regularly. Use cleaning products that do not smell.  Have someone vacuum when you are not home. Use a vacuum cleaner with a HEPA filter if possible.  Replace carpet with wood, tile, or vinyl flooring. Carpet can trap animal skin flakes and dust.  Use allergy-proof pillows, mattress covers, and box spring covers.  Wash bed sheets and blankets every week in hot water. Dry them in a dryer.  Keep your bedroom free of any triggers.  Avoid pets and keep windows closed when things that cause allergy symptoms are in the air.  Use blankets that are made of polyester or cotton.  Clean bathrooms and kitchens with bleach. If possible, have someone repaint the walls in these rooms with mold-resistant paint. Keep out of the rooms that are being cleaned and painted.  Wash your hands often with soap and water. If soap and water are not available, use hand sanitizer.  Do not allow anyone to smoke in your home. General instructions  Take over-the-counter and prescription medicines only as told by your doctor. ? Talk with your doctor if you have questions about how or when to take your medicines. ? Make note if you need to use your medicines more often than usual.  Do not use any products that contain nicotine or tobacco, such as cigarettes and e-cigarettes. If you need help quitting, ask your doctor.  Stay away from secondhand smoke.  Avoid doing things outdoors when allergen counts are high and when air quality is low.  Wear a ski mask   when doing outdoor activities in the winter. The mask should cover your nose and mouth. Exercise indoors on cold days if you can.  Warm up before you exercise. Take time to cool down after exercise.  Use a peak flow meter as told by your doctor. A peak flow meter is a tool that measures how well the lungs are working.  Keep track of the peak flow meter's readings.  Write them down.  Follow your asthma action plan. This is a written plan for taking care of your asthma and treating your attacks.  Make sure you get all the shots (vaccines) that your doctor recommends. Ask your doctor about a flu shot and a pneumonia shot.  Keep all follow-up visits as told by your doctor. This is important. Contact a doctor if:  You have wheezing, shortness of breath, or a cough even while taking medicine to prevent attacks.  The mucus you cough up (sputum) is thicker than usual.  The mucus you cough up changes from clear or white to yellow, green, gray, or bloody.  You have problems from the medicine you are taking, such as: ? A rash. ? Itching. ? Swelling. ? Trouble breathing.  You need reliever medicines more than 2-3 times a week.  Your peak flow reading is still at 50-79% of your personal best after following the action plan for 1 hour.  You have a fever. Get help right away if:  You seem to be worse and are not responding to medicine during an asthma attack.  You are short of breath even at rest.  You get short of breath when doing very little activity.  You have trouble eating, drinking, or talking.  You have chest pain or tightness.  You have a fast heartbeat.  Your lips or fingernails start to turn blue.  You are light-headed or dizzy, or you faint.  Your peak flow is less than 50% of your personal best.  You feel too tired to breathe normally. Summary  Asthma is a long-term (chronic) condition in which the airways get tight and narrow. An asthma attack can make it hard to breathe.  Asthma cannot be cured, but medicines and lifestyle changes can help control it.  Make sure you understand how to avoid triggers and how and when to use your medicines. This information is not intended to replace advice given to you by your health care provider. Make sure you discuss any questions you have with your health care provider. Document Revised:  11/29/2019 Document Reviewed: 11/29/2019 [employer] Patient Education  2021 [employer]   

## 2020-10-17 NOTE — Progress Notes (Signed)
[facility] [place], [place] [address]  Pulmonary Sleep Medicine   Office Visit Note  Patient Name: Vanessa Evans DOB: October 10, 1945 MRN [id number]  Date of Service: 10/17/2020  Complaints/HPI: She is here for follow-up of her chronic asthma.  Overall she states she is doing well.  Still has concerns about interstitial lung disease that her mother had.  I have reassured her that she is actually doing quite well and there is been no real signs of any overt idiopathic pulmonary fibrosis.  She would still like to keep surveillance on however.  ROS  General: (-) fever, (-) chills, (-) night sweats, (-) weakness Skin: (-) rashes, (-) itching,. Eyes: (-) visual changes, (-) redness, (-) itching. Nose and Sinuses: (-) nasal stuffiness or itchiness, (-) postnasal drip, (-) nosebleeds, (-) sinus trouble. Mouth and Throat: (-) sore throat, (-) hoarseness. Neck: (-) swollen glands, (-) enlarged thyroid, (-) neck pain. Respiratory: - cough, (-) bloody sputum, - shortness of breath, - wheezing. Cardiovascular: - ankle swelling, (-) chest pain. Lymphatic: (-) lymph node enlargement. Neurologic: (-) numbness, (-) tingling. Psychiatric: (-) anxiety, (-) depression   Current Medication: Outpatient Encounter Medications as of 10/17/2020  Medication Sig  . acetaminophen (TYLENOL) 500 MG tablet Take 1,000 mg by mouth 2 (two) times daily as needed for moderate pain.  Marland Kitchen albuterol (PROVENTIL) (2.5 MG/3ML) 0.083% nebulizer solution Take 3 mLs (2.5 mg total) by nebulization every 6 (six) hours as needed for wheezing or shortness of breath.  Marland Kitchen albuterol (VENTOLIN HFA) 108 (90 Base) MCG/ACT inhaler Inhale 2 puffs into the lungs every 4 (four) hours as needed for wheezing or shortness of breath.  Marland Kitchen aspirin EC 81 MG tablet Take 81 mg by mouth at bedtime.   . benzonatate (TESSALON) 100 MG capsule Take 2 capsules (200 mg total) by mouth 2 (two) times daily as needed for cough.  Marland Kitchen  BLACK ELDERBERRY PO Take 1 Dose by mouth 2 (two) times a day.  . Calcium Carbonate-Vitamin D 600-400 MG-UNIT tablet Take 1 tablet by mouth 2 (two) times daily.  . cholecalciferol (VITAMIN D) 1000 UNITS tablet Take 1,000 Units by mouth at bedtime.   . citalopram (CELEXA) 10 MG tablet Take 1 tablet (10 mg total) by mouth daily.  . cyanocobalamin 500 MCG tablet Take 500 mcg by mouth daily.  Marland Kitchen Dextran 70-Hypromellose (ARTIFICIAL TEARS) 0.1-0.3 % SOLN Apply 1-2 drops to eye daily.  . diclofenac Sodium (VOLTAREN) 1 % GEL Apply 4 g topically 4 (four) times daily.  Marland Kitchen diltiazem (CARDIZEM CD) 120 MG 24 hr capsule Take 120 mg by mouth at bedtime.   Marland Kitchen diltiazem (TIAZAC) 120 MG 24 hr capsule Take 1 capsule by mouth daily.  . diphenhydrAMINE (BENADRYL) 25 MG tablet Take 25-50 mg by mouth daily as needed for itching.  . docusate sodium (COLACE) 100 MG capsule Take 200 mg by mouth at bedtime as needed for moderate constipation.   Marland Kitchen EPINEPHrine (EPIPEN 2-PAK) 0.3 mg/0.3 mL IJ SOAJ injection use as directed for severe allergy reaction  . fenofibrate 160 MG tablet take1 tab po daily for chol  . fluticasone (FLONASE) 50 MCG/ACT nasal spray Use 2 sprays in each nostril daily,  . folic acid (FOLVITE) 188 MCG tablet Take 400 mcg by mouth daily.  Marland Kitchen gabapentin (NEURONTIN) 100 MG capsule Take 100 mg by mouth 2 (two) times daily.  Marland Kitchen guaiFENesin (MUCINEX) 600 MG 12 hr tablet Take 600 mg by mouth at bedtime as needed (congestion).  . Hypromellose (ARTIFICIAL  TEARS OP) Place 1-2 drops into both eyes daily as needed (dry eyes).  Marland Kitchen ketotifen (ZADITOR) 0.025 % ophthalmic solution Place 1 drop into both eyes 2 (two) times daily as needed (allergies).  Marland Kitchen levocetirizine (XYZAL) 5 MG tablet Take 1 tablet (5 mg total) by mouth every evening.  Marland Kitchen levothyroxine (SYNTHROID, LEVOTHROID) 50 MCG tablet Take 50 mcg by mouth daily before breakfast. Brand Name only  . Magnesium 250 MG TABS Take 1 tablet by mouth daily.  . Melatonin 5 MG TABS  Take 1 tablet by mouth at bedtime. For sleep  . Multiple Vitamins-Minerals (MULTIVITAMIN WITH MINERALS) tablet Take 1 tablet by mouth daily.  Marland Kitchen nystatin cream (MYCOSTATIN) Apply 1 application topically 2 (two) times daily.  . OXYGEN Inhale into the lungs. 2 litre at night  . pantoprazole (PROTONIX) 40 MG tablet Take 1 tablet (40 mg total) by mouth 2 (two) times daily.  Marland Kitchen pyridoxine (B-6) 100 MG tablet Take 100 mg by mouth daily.  . sodium chloride (OCEAN) 0.65 % SOLN nasal spray Place 1 spray into both nostrils 2 (two) times daily.  . sucralfate (CARAFATE) 1 g tablet Take 3 times daily with meals.  . Vitamin A 7.5 MG (25000 UT) CAPS Take 1 tablet by mouth 2 (two) times daily.  Marland Kitchen zinc gluconate 50 MG tablet Take 50 mg by mouth daily.   No facility-administered encounter medications on file as of 10/17/2020.    Surgical History: Past Surgical History:  Procedure Laterality Date  . 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 BIOPSY THYROID  2018  . bladder biopsies  2003   9 biopsies done by dr. cope.  all benign.  inflammatory process going on in bladder  . BREAST BIOPSY Right    benign  . BREAST SURGERY Right    lumpectomy  . CATARACT EXTRACTION W/PHACO Left 12/25/2014   Procedure: CATARACT EXTRACTION PHACO AND INTRAOCULAR LENS PLACEMENT (IOC);  Surgeon: Birder Robson, MD;  Location: [facility] ORS;  Service: Ophthalmology;  Laterality: Left;  [place] 00:32 AP% 20.0 CDE 6.49  . COLONOSCOPY W/ BIOPSIES    . COLONOSCOPY W/ POLYPECTOMY  2002, 2004, 2005   adenomatous polyps removed  . COLONOSCOPY WITH PROPOFOL N/A 08/23/2017   Procedure: COLONOSCOPY WITH PROPOFOL;  Surgeon: Manya Silvas, MD;  Location: [facility] ENDOSCOPY;  Service: Endoscopy;  Laterality: N/A;  . COLONOSCOPY WITH PROPOFOL N/A 11/08/2017   Procedure: COLONOSCOPY WITH PROPOFOL;  Surgeon: Manya Silvas, MD;  Location: [facility] ENDOSCOPY;  Service: Endoscopy;  Laterality: N/A;  .  CYSTOCELE REPAIR N/A 04/09/2016   Procedure: ANTERIOR REPAIR (CYSTOCELE);  Surgeon: Gae Dry, MD;  Location: [facility] ORS;  Service: Gynecology;  Laterality: N/A;  . DIAGNOSTIC LAPAROSCOPY  2008   removed both ovaries with cysts, tubes and fibroids  . DILATION AND CURETTAGE OF UTERUS  1973  . ESOPHAGOGASTRODUODENOSCOPY (EGD) WITH PROPOFOL N/A 08/23/2017   Procedure: ESOPHAGOGASTRODUODENOSCOPY (EGD) WITH PROPOFOL;  Surgeon: Manya Silvas, MD;  Location: [facility] ENDOSCOPY;  Service: Endoscopy;  Laterality: N/A;  . EYE SURGERY Bilateral 2016  . HERNIA REPAIR Right 2008   Inguinal hernia Repair, ventral hernia repair  . JOINT REPLACEMENT Right 2008   knee  . KNEE ARTHROSCOPY Right   . LUMBAR LAMINECTOMY/DECOMPRESSION MICRODISCECTOMY Right 03/21/2013   Procedure: Right Lumbar five-Sacral one Laminectomy for Synovial Cyst;  Surgeon: Faythe Ghee, MD;  Location: [facility] NEURO ORS;  Service: Neurosurgery;  Laterality: Right;  right  . OOPHORECTOMY    . REVERSE SHOULDER ARTHROPLASTY Right 02/22/2018   Procedure: REVERSE SHOULDER ARTHROPLASTY;  Surgeon: Corky Mull, MD;  Location: [facility] ORS;  Service: Orthopedics;  Laterality: Right;  . TUBAL LIGATION    . UNILATERAL SALPINGECTOMY Left 04/09/2016   Procedure: UNILATERAL SALPINGECTOMY;  Surgeon: Gae Dry, MD;  Location: [facility] ORS;  Service: Gynecology;  Laterality: Left;  . UPPER GI ENDOSCOPY  2010   with biopsy of gastric erosion  . VAGINAL HYSTERECTOMY N/A 04/09/2016   Procedure: HYSTERECTOMY VAGINAL;  Surgeon: Gae Dry, MD;  Location: [facility] ORS;  Service: Gynecology;  Laterality: N/A;  . VAGINAL HYSTERECTOMY  2017   and bladder tack    Medical History: Past Medical History:  Diagnosis Date  . Arthritis    osteo  . Asthma    needs rescue inhaler few times a year  . Claustrophobia 04/21/2020  . Depression   . Dizziness    light headed spells but it has been a while  . Dysrhythmia    tachycardia.(cardizem). brady during  procedures  . GERD (gastroesophageal reflux disease)    gastritis  . Headache(784.0)   . Heart murmur 2019   aortic. dr. Nehemiah Massed not worried about this  . Hyperlipidemia   . Hypertension   . Hypothyroidism   . Migraine   . Orthopnea   . Pneumonia    long time ago (greater than 5 years ago)  . PONV (postoperative nausea and vomiting)    very anxious during cataract surgery. brady during colonoscopy  . Shortness of breath    any exertion, cannot lie flat    Family History: Family History  Problem Relation Age of Onset  . Pulmonary fibrosis Mother   . Melanoma Father   . Cardiomyopathy Sister        and arrhythmia  . Breast cancer Cousin        paternal 1st    Social History: Social History   Socioeconomic History  . Marital status: Married    Spouse name: Not on file  . Number of children: Not on file  . Years of education: Not on file  . Highest education level: Not on file  Occupational History  . Not on file  Tobacco Use  . Smoking status: Former Smoker    Types: Cigarettes    Quit date: 1967    Years since quitting: 55.2  . Smokeless tobacco: Never Used  Vaping Use  . Vaping Use: Never used  Substance and Sexual Activity  . Alcohol use: Not Currently  . Drug use: No  . Sexual activity: Not Currently  Other Topics Concern  . Not on file  Social History Narrative  . Not on file   Social Determinants of Health   Financial Resource Strain: Not on file  Food Insecurity: Not on file  Transportation Needs: Not on file  Physical Activity: Not on file  Stress: Not on file  Social Connections: Not on file  Intimate Partner Violence: Not on file    Vital Signs: Blood pressure 140/78, pulse 86, temperature 97.8 F (36.6 C), resp. rate 16, height 5' (1.524 m), weight 152 lb (68.9 kg), SpO2 97 %.  Examination: General Appearance: The patient is well-developed, well-nourished, and in no distress. Skin: Gross inspection of skin unremarkable. Head:  normocephalic, no gross deformities. Eyes: no gross deformities noted. ENT: ears appear grossly normal no exudates. Neck: Supple. No thyromegaly. No LAD. Respiratory: Scattered rhonchi are noted very distal clear with coughing.  Cardiovascular: Normal S1 and S2 without murmur or rub. Extremities: No cyanosis. pulses are equal. Neurologic: Alert and oriented. No involuntary movements.  LABS: Recent Results (from the past 2160 hour(s))  Comprehensive metabolic panel     Status: Abnormal   Collection Time: 08/23/20  9:07 AM  Result Value Ref Range   Glucose 103 (H) 65 - 99 mg/dL   BUN 17 8 - 27 mg/dL   Creatinine, Ser 0.71 0.57 - 1.00 mg/dL   GFR calc non Af Amer 84 >59 mL/min/1.73   GFR calc Af Amer 96 >59 mL/min/1.73    Comment: **In accordance with recommendations from the NKF-ASN Task force,**   [facility] is in the process of updating its eGFR calculation to the   2021 CKD-EPI creatinine equation that estimates kidney function   without a race variable.    BUN/Creatinine Ratio 24 12 - 28   Sodium 138 134 - 144 mmol/L   Potassium 4.2 3.5 - 5.2 mmol/L   Chloride 98 96 - 106 mmol/L   CO2 22 20 - 29 mmol/L   Calcium 9.7 8.7 - 10.3 mg/dL   Total Protein 6.7 6.0 - 8.5 g/dL   Albumin 4.4 3.7 - 4.7 g/dL   Globulin, Total 2.3 1.5 - 4.5 g/dL   Albumin/Globulin Ratio 1.9 1.2 - 2.2   Bilirubin Total 0.3 0.0 - 1.2 mg/dL   Alkaline Phosphatase 66 44 - 121 IU/L    Comment:               **Please note reference interval change**   AST 23 0 - 40 IU/L   ALT 26 0 - 32 IU/L  CBC     Status: None   Collection Time: 08/23/20  9:07 AM  Result Value Ref Range   WBC 4.6 3.4 - 10.8 x10E3/uL   RBC 4.25 3.77 - 5.28 x10E6/uL   Hemoglobin 12.1 11.1 - 15.9 g/dL   Hematocrit 36.3 34.0 - 46.6 %   MCV 85 79 - 97 fL   MCH 28.5 26.6 - 33.0 pg   MCHC 33.3 31.5 - 35.7 g/dL   RDW 12.2 11.7 - 15.4 %   Platelets 331 150 - 450 x10E3/uL  Lipid Panel With LDL/HDL Ratio     Status: Abnormal   Collection Time:  08/23/20  9:07 AM  Result Value Ref Range   Cholesterol, Total 172 100 - 199 mg/dL   Triglycerides 166 (H) 0 - 149 mg/dL   HDL 48 >39 mg/dL   VLDL Cholesterol Cal 29 5 - 40 mg/dL   LDL Chol Calc (NIH) 95 0 - 99 mg/dL   LDL/HDL Ratio 2.0 0.0 - 3.2 ratio    Comment:                                     LDL/HDL Ratio                                             Men  Women                               1/2 Avg.Risk  1.0    1.5  Avg.Risk  3.6    3.2                                2X Avg.Risk  6.2    5.0                                3X Avg.Risk  8.0    6.1   Iron and TIBC     Status: None   Collection Time: 08/23/20  9:07 AM  Result Value Ref Range   Total Iron Binding Capacity 426 250 - 450 ug/dL   UIBC 363 118 - 369 ug/dL   Iron 63 27 - 139 ug/dL   Iron Saturation 15 15 - 55 %  B12 and Folate Panel     Status: None   Collection Time: 08/23/20  9:07 AM  Result Value Ref Range   Vitamin B-12 1,148 232 - 1,245 pg/mL   Folate >20.0 >3.0 ng/mL    Comment: A serum folate concentration of less than 3.1 ng/mL is considered to represent clinical deficiency.   HCV Ab w Reflex to Quant PCR     Status: None   Collection Time: 08/23/20  9:07 AM  Result Value Ref Range   HCV Ab <0.1 0.0 - 0.9 s/co ratio  Interpretation:     Status: None   Collection Time: 08/23/20  9:07 AM  Result Value Ref Range   HCV Interp 1: Comment     Comment: Negative Not infected with HCV, unless recent infection is suspected or other evidence exists to indicate HCV infection.   VITAMIN D 25 Hydroxy (Vit-D Deficiency, Fractures)     Status: None   Collection Time: 08/23/20  9:07 AM  Result Value Ref Range   Vit D, 25-Hydroxy 68.4 30.0 - 100.0 ng/mL    Comment: Vitamin D deficiency has been defined by the [place] practice guideline as a level of serum 25-OH vitamin D less than 20 ng/mL (1,2). The Endocrine Society went on to further define  vitamin D insufficiency as a level between 21 and 29 ng/mL (2). 1. IOM (Institute of Medicine). 2010. Dietary reference    intakes for calcium and D. [place]: The    [employer]. 2. Holick MF, Binkley , Bischoff-Ferrari HA, et al.    Evaluation, treatment, and prevention of vitamin D    deficiency: an Endocrine Society clinical practice    guideline. JCEM. 2011 Jul; 96(7):1911-30.   Ferritin     Status: None   Collection Time: 08/23/20  9:07 AM  Result Value Ref Range   Ferritin 83 15 - 150 ng/mL  UA/M w/rflx Culture, Routine     Status: Abnormal   Collection Time: 09/09/20 10:11 AM   Specimen: Urine   Urine  Result Value Ref Range   Specific Gravity, UA 1.006 1.005 - 1.030   pH, UA 7.0 5.0 - 7.5   Color, UA Yellow Yellow   Appearance Ur Clear Clear   Leukocytes,UA 1+ (A) Negative   Protein,UA Negative Negative/Trace   Glucose, UA Negative Negative   Ketones, UA Negative Negative   RBC, UA Negative Negative   Bilirubin, UA Negative Negative   Urobilinogen, Ur 0.2 0.2 - 1.0 mg/dL   Nitrite, UA Negative Negative   Microscopic Examination See below:     Comment: Microscopic was indicated and was  performed.   Urinalysis Reflex Comment     Comment: This specimen has reflexed to a Urine Culture.  Microscopic Examination     Status: Abnormal   Collection Time: 09/09/20 10:11 AM   Urine  Result Value Ref Range   WBC, UA 0-5 0 - 5 /hpf   RBC 3-10 (A) 0 - 2 /hpf   Epithelial Cells (non renal) 0-10 0 - 10 /hpf   Casts None seen None seen /lpf   Bacteria, UA None seen None seen/Few  Urine Culture, Reflex     Status: Abnormal   Collection Time: 09/09/20 10:11 AM   Urine  Result Value Ref Range   Urine Culture, Routine Final report (A)    Organism ID, Bacteria Comment (A)     Comment: Pseudomonas aeruginosa Greater than 100,000 colony forming units per mL    ORGANISM ID, BACTERIA Comment     Comment: Mixed urogenital flora 25,000-50,000 colony forming  units per mL    Antimicrobial Susceptibility Comment     Comment:       ** S = Susceptible; I = Intermediate; R = Resistant **                    P = Positive; N = Negative             MICS are expressed in micrograms per mL    Antibiotic                 RSLT#1    RSLT#2    RSLT#3    RSLT#4 Amikacin                       S Cefepime                       S Ceftazidime                    S Ciprofloxacin                  S Gentamicin                     S Imipenem                       S Levofloxacin                   S Meropenem                      S Piperacillin                   S Ticarcillin                    S Tobramycin                     S   POCT Urinalysis Dipstick     Status: None   Collection Time: 10/03/20 12:00 PM  Result Value Ref Range   Color, UA     Clarity, UA     Glucose, UA Negative Negative   Bilirubin, UA Negative    Ketones, UA Negative    Spec Grav, UA 1.010 1.010 - 1.025   Blood, UA Negative    pH, UA 6.0 5.0 - 8.0   Protein, UA Negative Negative   Urobilinogen, UA 0.2 0.2 or  1.0 E.U./dL   Nitrite, UA Negative    Leukocytes, UA Negative Negative   Appearance     Odor      Radiology: CT Chest High Resolution  Result Date: 10/16/2020 CLINICAL DATA:  75 year old female with history of COVID infection in February 2022. Continued shortness of breath. Family history of pulmonary fibrosis. EXAM: CT CHEST WITHOUT CONTRAST TECHNIQUE: Multidetector CT imaging of the chest was performed following the standard protocol without intravenous contrast. High resolution imaging of the lungs, as well as inspiratory and expiratory imaging, was performed. COMPARISON:  High-resolution chest CT 11/07/2019. FINDINGS: Cardiovascular: Heart size is normal. There is no significant pericardial fluid, thickening or pericardial calcification. There is aortic atherosclerosis, as well as atherosclerosis of the great vessels of the mediastinum and the coronary arteries, including  calcified atherosclerotic plaque in the left anterior descending coronary arteries. Mediastinum/Nodes: No pathologically enlarged mediastinal or hilar lymph nodes. Please note that accurate exclusion of hilar adenopathy is limited on noncontrast CT scans. Esophagus is unremarkable in appearance. No axillary lymphadenopathy. Lungs/Pleura: High-resolution images again demonstrates some very mild linear scarring in the lung bases, as well as areas of chronic pleuroparenchymal thickening and architectural distortion in the lung apices indicative of chronic post infectious scarring. These are isolated findings without generalized regions of ground-glass attenuation, septal thickening, subpleural reticulation, traction bronchiectasis or frank honeycombing to suggest interstitial lung disease. Inspiratory and expiratory imaging demonstrates partial collapse of the trachea and mainstem bronchi indicative of tracheobronchomalacia. There is also evidence of mild air trapping indicative of small airways disease. No acute consolidative airspace disease. No pleural effusions. Upper Abdomen: Aortic atherosclerosis. Musculoskeletal: Status post right shoulder arthroplasty. There are no aggressive appearing lytic or blastic lesions noted in the visualized portions of the skeleton. IMPRESSION: 1. Mild chronic scarring in the extreme lung bases and lung apices. No definitive imaging findings to suggest interstitial lung disease. 2. Mild air trapping indicative of mild small airways disease. 3. Tracheobronchomalacia. 4. Aortic atherosclerosis, in addition to left anterior descending coronary artery disease. Assessment for potential risk factor modification, dietary therapy or pharmacologic therapy may be warranted, if clinically indicated. Aortic Atherosclerosis (ICD10-I70.0). Electronically Signed   By: Vinnie Langton M.D.   On: 10/16/2020 08:54    CT Chest High Resolution  Result Date: 10/16/2020 CLINICAL DATA:  75 year old  female with history of COVID infection in February 2022. Continued shortness of breath. Family history of pulmonary fibrosis. EXAM: CT CHEST WITHOUT CONTRAST TECHNIQUE: Multidetector CT imaging of the chest was performed following the standard protocol without intravenous contrast. High resolution imaging of the lungs, as well as inspiratory and expiratory imaging, was performed. COMPARISON:  High-resolution chest CT 11/07/2019. FINDINGS: Cardiovascular: Heart size is normal. There is no significant pericardial fluid, thickening or pericardial calcification. There is aortic atherosclerosis, as well as atherosclerosis of the great vessels of the mediastinum and the coronary arteries, including calcified atherosclerotic plaque in the left anterior descending coronary arteries. Mediastinum/Nodes: No pathologically enlarged mediastinal or hilar lymph nodes. Please note that accurate exclusion of hilar adenopathy is limited on noncontrast CT scans. Esophagus is unremarkable in appearance. No axillary lymphadenopathy. Lungs/Pleura: High-resolution images again demonstrates some very mild linear scarring in the lung bases, as well as areas of chronic pleuroparenchymal thickening and architectural distortion in the lung apices indicative of chronic post infectious scarring. These are isolated findings without generalized regions of ground-glass attenuation, septal thickening, subpleural reticulation, traction bronchiectasis or frank honeycombing to suggest interstitial lung disease. Inspiratory and expiratory imaging demonstrates partial collapse  of the trachea and mainstem bronchi indicative of tracheobronchomalacia. There is also evidence of mild air trapping indicative of small airways disease. No acute consolidative airspace disease. No pleural effusions. Upper Abdomen: Aortic atherosclerosis. Musculoskeletal: Status post right shoulder arthroplasty. There are no aggressive appearing lytic or blastic lesions noted in  the visualized portions of the skeleton. IMPRESSION: 1. Mild chronic scarring in the extreme lung bases and lung apices. No definitive imaging findings to suggest interstitial lung disease. 2. Mild air trapping indicative of mild small airways disease. 3. Tracheobronchomalacia. 4. Aortic atherosclerosis, in addition to left anterior descending coronary artery disease. Assessment for potential risk factor modification, dietary therapy or pharmacologic therapy may be warranted, if clinically indicated. Aortic Atherosclerosis (ICD10-I70.0). Electronically Signed   By: Vinnie Langton M.D.   On: 10/16/2020 08:54    CT Chest High Resolution  Result Date: 10/16/2020 CLINICAL DATA:  75 year old female with history of COVID infection in February 2022. Continued shortness of breath. Family history of pulmonary fibrosis. EXAM: CT CHEST WITHOUT CONTRAST TECHNIQUE: Multidetector CT imaging of the chest was performed following the standard protocol without intravenous contrast. High resolution imaging of the lungs, as well as inspiratory and expiratory imaging, was performed. COMPARISON:  High-resolution chest CT 11/07/2019. FINDINGS: Cardiovascular: Heart size is normal. There is no significant pericardial fluid, thickening or pericardial calcification. There is aortic atherosclerosis, as well as atherosclerosis of the great vessels of the mediastinum and the coronary arteries, including calcified atherosclerotic plaque in the left anterior descending coronary arteries. Mediastinum/Nodes: No pathologically enlarged mediastinal or hilar lymph nodes. Please note that accurate exclusion of hilar adenopathy is limited on noncontrast CT scans. Esophagus is unremarkable in appearance. No axillary lymphadenopathy. Lungs/Pleura: High-resolution images again demonstrates some very mild linear scarring in the lung bases, as well as areas of chronic pleuroparenchymal thickening and architectural distortion in the lung apices  indicative of chronic post infectious scarring. These are isolated findings without generalized regions of ground-glass attenuation, septal thickening, subpleural reticulation, traction bronchiectasis or frank honeycombing to suggest interstitial lung disease. Inspiratory and expiratory imaging demonstrates partial collapse of the trachea and mainstem bronchi indicative of tracheobronchomalacia. There is also evidence of mild air trapping indicative of small airways disease. No acute consolidative airspace disease. No pleural effusions. Upper Abdomen: Aortic atherosclerosis. Musculoskeletal: Status post right shoulder arthroplasty. There are no aggressive appearing lytic or blastic lesions noted in the visualized portions of the skeleton. IMPRESSION: 1. Mild chronic scarring in the extreme lung bases and lung apices. No definitive imaging findings to suggest interstitial lung disease. 2. Mild air trapping indicative of mild small airways disease. 3. Tracheobronchomalacia. 4. Aortic atherosclerosis, in addition to left anterior descending coronary artery disease. Assessment for potential risk factor modification, dietary therapy or pharmacologic therapy may be warranted, if clinically indicated. Aortic Atherosclerosis (ICD10-I70.0). Electronically Signed   By: Vinnie Langton M.D.   On: 10/16/2020 08:54      Assessment and Plan: Patient Active Problem List   Diagnosis Date Noted  . Peptic ulcer disease 04/21/2020  . Claustrophobia 04/21/2020  . Urinary tract infection without hematuria 02/04/2020  . Pain in both lower extremities 12/18/2019  . Inflammatory polyarthritis ([place]) 07/26/2019  . Excessive daytime sleepiness 07/26/2019  . Encounter for screening mammogram for malignant neoplasm of breast 07/26/2019  . Intercostal muscle pain 06/25/2019  . Positive ANA (antinuclear antibody) 05/01/2019  . Primary osteoarthritis involving multiple joints 04/26/2019  . Mild intermittent asthma without  complication 40/98/1191  . Seasonal allergic rhinitis due to pollen 08/22/2018  .  Chills with fever 08/11/2018  . Sore throat 08/11/2018  . Acute upper respiratory infection 08/11/2018  . Candidiasis 08/11/2018  . Iron deficiency anemia 05/05/2018  . Right hip pain 04/27/2018  . Accidental fall from furniture 04/27/2018  . Vitamin D deficiency 04/27/2018  . Cough 04/18/2018  . Dysuria 04/18/2018  . Status post reverse total shoulder replacement, right 02/22/2018  . Abnormal ECG 02/18/2018  . Pain in joint of right shoulder 02/13/2018  . Abdominal aortic atherosclerosis ([place]) 02/13/2018  . Episode of recurrent major depressive disorder ([place]) 02/13/2018  . Encounter for general adult medical examination with abnormal findings 02/13/2018  . Lipoma of right shoulder 01/31/2018  . Rotator cuff tendinitis, right 01/31/2018  . Chronic superficial gastritis without bleeding 12/12/2017  . Schatzki's ring 12/12/2017  . Acute gastritis 11/08/2017  . Nausea 10/18/2017  . Other fatigue 10/18/2017  . Allergic rhinitis 09/20/2017  . Eczema 09/20/2017  . Hematuria 09/20/2017  . Mixed hyperlipidemia 09/20/2017  . Osteoarthritis 09/20/2017  . Osteoporosis, post-menopausal 09/20/2017  . Palpitations 07/05/2017  . Epigastric pain 05/24/2017  . Gastroesophageal reflux disease without esophagitis 05/24/2017  . Impingement syndrome of left shoulder 10/02/2016  . Trochanteric bursitis of left hip 10/02/2016  . SOB (shortness of breath) 04/16/2016  . Uterine prolapse 04/09/2016  . Cystocele 04/09/2016  . Hyponatremia 04/09/2016  . Benign essential HTN 11/18/2015  . Paroxysmal supraventricular tachycardia ([place]) 11/18/2015  . Premature ventricular contraction 07/20/2014  . History of colonic polyps 05/29/2014  . Asthma 02/05/2014  . Chest pain 02/05/2014  . Increased frequency of urination 02/05/2014  . Tachycardia 02/05/2014  . Female stress incontinence 12/14/2013  . Gross hematuria  12/14/2013  . Incomplete emptying of bladder 12/14/2013  . Other chronic cystitis without hematuria 12/14/2013    1. Mild intermittent asthma without complication She is under good control with her mild asthma.  Plan is going to be to continue with the current inhaler regimen which has been prescribed.  2. Interstitial pulmonary disease ([place]) She has concern for interstitial lung disease I recommended we continue to survey we will with CT scans.  Last CT scan of the chest that she had done the results are noted above  3. COVID-19 virus infection Status post COVID-19 virus infection in recovery we will continue to monitor.  4. Coronary atherosclerosis due to calcified coronary lesion Coronary artery sclerosis being followed closely by her primary care team.  This was incidentally seen on the CT scan.  General Counseling: I have discussed the findings of the evaluation and examination with Jerlene.  I have also discussed any further diagnostic evaluation thatmay be needed or ordered today. Shantoria verbalizes understanding of the findings of todays visit. We also reviewed her medications today and discussed drug interactions and side effects including but not limited excessive drowsiness and altered mental states. We also discussed that there is always a risk not just to her but also people around her. she has been encouraged to call the office with any questions or concerns that should arise related to todays visit.  No orders of the defined types were placed in this encounter.    Time spent: 35-minute  I have personally obtained a history, examined the patient, evaluated laboratory and imaging results, formulated the assessment and plan and placed orders.    Allyne Gee, MD [facility] Pulmonary and Critical Care Sleep medicine

## 2020-10-17 NOTE — Telephone Encounter (Signed)
Gave orders to [place] home patient due to pt switching oxygen company from [employer]. Also gave [employer] orders to [place] and pick up equipment. Lizania Bouchard

## 2020-10-20 NOTE — Procedures (Signed)
[facility] [address] [place], [address]    Complete Pulmonary Function Testing Interpretation:  FINDINGS:  The forced vital capacity is normal.  FEV1 is normal.  F1 FVC ratio is mildly decreased.  Total capacity is normal.  Residual volume is decreased.  Residual volume thought process ratio is decreased.  FRC is normal.  DLCO was normal.  Postbronchodilator no significant change in FEV1 was noted.  Clinical correlation is recommended  IMPRESSION:  This pulmonary function study is within normal limits.  There is no significant change in the amount FEV1 after bronchodilators clinical improvement may still occur in the absence of spirometric improvement and does not with the use of bronchodilators.  Correlation is recommended  Allyne Gee, MD [facility] Pulmonary Critical Care Medicine Sleep Medicine

## 2020-10-25 DIAGNOSIS — I471 Supraventricular tachycardia: Secondary | ICD-10-CM | POA: Diagnosis not present

## 2020-10-25 DIAGNOSIS — R9431 Abnormal electrocardiogram [ECG] [EKG]: Secondary | ICD-10-CM | POA: Diagnosis not present

## 2020-10-25 DIAGNOSIS — R0600 Dyspnea, unspecified: Secondary | ICD-10-CM | POA: Diagnosis not present

## 2020-10-25 DIAGNOSIS — I25119 Atherosclerotic heart disease of native coronary artery with unspecified angina pectoris: Secondary | ICD-10-CM | POA: Diagnosis not present

## 2020-10-25 DIAGNOSIS — R079 Chest pain, unspecified: Secondary | ICD-10-CM | POA: Diagnosis not present

## 2020-10-25 DIAGNOSIS — I493 Ventricular premature depolarization: Secondary | ICD-10-CM | POA: Diagnosis not present

## 2020-10-25 DIAGNOSIS — I700 Atherosclerosis of aorta: Secondary | ICD-10-CM | POA: Diagnosis not present

## 2020-10-25 DIAGNOSIS — R002 Palpitations: Secondary | ICD-10-CM | POA: Diagnosis not present

## 2020-10-25 DIAGNOSIS — E782 Mixed hyperlipidemia: Secondary | ICD-10-CM | POA: Diagnosis not present

## 2020-10-25 DIAGNOSIS — I10 Essential (primary) hypertension: Secondary | ICD-10-CM | POA: Diagnosis not present

## 2020-10-28 DIAGNOSIS — I25119 Atherosclerotic heart disease of native coronary artery with unspecified angina pectoris: Secondary | ICD-10-CM | POA: Insufficient documentation

## 2020-10-30 DIAGNOSIS — R079 Chest pain, unspecified: Secondary | ICD-10-CM | POA: Diagnosis not present

## 2020-11-01 ENCOUNTER — Other Ambulatory Visit: Payer: Self-pay | Admitting: Physician Assistant

## 2020-11-01 ENCOUNTER — Telehealth: Payer: Self-pay

## 2020-11-01 NOTE — Telephone Encounter (Signed)
Faxed over Bone density order to [facility]. Will call and follow up to make sure they received the order. Vanessa Evans

## 2020-11-05 DIAGNOSIS — M8949 Other hypertrophic osteoarthropathy, multiple sites: Secondary | ICD-10-CM | POA: Diagnosis not present

## 2020-11-05 DIAGNOSIS — M5136 Other intervertebral disc degeneration, lumbar region: Secondary | ICD-10-CM | POA: Diagnosis not present

## 2020-11-11 ENCOUNTER — Telehealth: Payer: Self-pay | Admitting: Internal Medicine

## 2020-11-11 NOTE — Progress Notes (Signed)
  Chronic Care Management   Note  11/11/2020 Name: GISSELE NARDUCCI MRN: [id number] DOB: [birth year]  RAYNE COWDREY is a 75 y.o. year old female who is a primary care patient of Lavera Guise, MD. I reached out to Sharlynn Oliphant by phone today in response to a referral sent by Ms. Raynelle Dick Matchett's PCP, Lavera Guise, MD.   Ms. Shrode was given information about Chronic Care Management services today including:  1. CCM service includes personalized support from designated clinical staff supervised by her physician, including individualized plan of care and coordination with other care providers 2. 24/7 contact phone numbers for assistance for urgent and routine care needs. 3. Service will only be billed when office clinical staff spend 20 minutes or more in a month to coordinate care. 4. Only one practitioner may furnish and bill the service in a calendar month. 5. The patient may stop CCM services at any time (effective at the end of the month) by phone call to the office staff.   Patient did not agree to enrollment in care management services and does not wish to consider at this time.  Follow up plan:   Carley Perdue [employer] Scheduler

## 2020-11-13 DIAGNOSIS — M8588 Other specified disorders of bone density and structure, other site: Secondary | ICD-10-CM | POA: Diagnosis not present

## 2020-11-13 DIAGNOSIS — E2839 Other primary ovarian failure: Secondary | ICD-10-CM | POA: Diagnosis not present

## 2020-11-18 DIAGNOSIS — H353211 Exudative age-related macular degeneration, right eye, with active choroidal neovascularization: Secondary | ICD-10-CM | POA: Diagnosis not present

## 2020-11-28 ENCOUNTER — Telehealth: Payer: Self-pay

## 2020-11-28 NOTE — Telephone Encounter (Signed)
Spoke with [employer]. Bone density completed 11/13/20-Toni

## 2020-12-05 ENCOUNTER — Encounter: Payer: Self-pay | Admitting: Physician Assistant

## 2020-12-05 ENCOUNTER — Other Ambulatory Visit: Payer: Self-pay

## 2020-12-05 ENCOUNTER — Ambulatory Visit (INDEPENDENT_AMBULATORY_CARE_PROVIDER_SITE_OTHER): Payer: Medicare Other | Admitting: Physician Assistant

## 2020-12-05 DIAGNOSIS — F331 Major depressive disorder, recurrent, moderate: Secondary | ICD-10-CM | POA: Diagnosis not present

## 2020-12-05 DIAGNOSIS — I10 Essential (primary) hypertension: Secondary | ICD-10-CM

## 2020-12-05 DIAGNOSIS — J301 Allergic rhinitis due to pollen: Secondary | ICD-10-CM | POA: Diagnosis not present

## 2020-12-05 DIAGNOSIS — M064 Inflammatory polyarthropathy: Secondary | ICD-10-CM

## 2020-12-05 DIAGNOSIS — J4520 Mild intermittent asthma, uncomplicated: Secondary | ICD-10-CM

## 2020-12-05 DIAGNOSIS — M8588 Other specified disorders of bone density and structure, other site: Secondary | ICD-10-CM

## 2020-12-05 MED ORDER — DICLOFENAC SODIUM 1 % EX GEL
4.0000 g | Freq: Four times a day (QID) | CUTANEOUS | 3 refills | Status: DC
Start: 2020-12-05 — End: 2023-04-08

## 2020-12-05 MED ORDER — ALBUTEROL SULFATE HFA 108 (90 BASE) MCG/ACT IN AERS
2.0000 | INHALATION_SPRAY | RESPIRATORY_TRACT | 5 refills | Status: DC | PRN
Start: 2020-12-05 — End: 2021-06-26

## 2020-12-05 MED ORDER — FLUTICASONE PROPIONATE 50 MCG/ACT NA SUSP
NASAL | 2 refills | Status: DC
Start: 2020-12-05 — End: 2021-09-11

## 2020-12-05 NOTE — Progress Notes (Signed)
[facility] [place], [place] [address]  Internal MEDICINE  Office Visit Note  Patient Name: Vanessa Evans  [id number]  [id number]  Date of Service: 12/08/2020  Chief Complaint  Patient presents with  . Follow-up    Aches and pains, fatigue, hair is falling out a lot, review dexa  . Depression  . Gastroesophageal Reflux  . Hyperlipidemia  . Hypertension  . Asthma  . Anxiety    HPI Patient is here for routine follow-up and medication refill. -Reviewed bone density scan showing slightly reduced bone density in the spine, otherwise normal. She will continue calcium and vitamin D supplementation -Has appt with neurosurgery for spine. -Wet macular degeneration in R eye and injections are helping, dry macular degen in L eye now. Follows with retinal specialist -Followed with cardiology and had stress test and plans for echo may 19-- will follow up with them the week after. -Going to foot and ankle specialist for her ankle pain, impacting her walking -BP improved from last visit. Did discover she was positive for covid after last visit and still has fatigue  Current Medication: Outpatient Encounter Medications as of 12/05/2020  Medication Sig  . pregabalin (LYRICA) 25 MG capsule Take by mouth.  Marland Kitchen acetaminophen (TYLENOL) 500 MG tablet Take 1,000 mg by mouth 2 (two) times daily as needed for moderate pain.  Marland Kitchen albuterol (PROVENTIL) (2.5 MG/3ML) 0.083% nebulizer solution Take 3 mLs (2.5 mg total) by nebulization every 6 (six) hours as needed for wheezing or shortness of breath.  Marland Kitchen albuterol (VENTOLIN HFA) 108 (90 Base) MCG/ACT inhaler Inhale 2 puffs into the lungs every 4 (four) hours as needed for wheezing or shortness of breath.  Marland Kitchen aspirin EC 81 MG tablet Take 81 mg by mouth at bedtime.   . benzonatate (TESSALON) 100 MG capsule Take 2 capsules (200 mg total) by mouth 2 (two) times daily as needed for cough.  Marland Kitchen BLACK ELDERBERRY PO Take 1 Dose by mouth 2 (two)  times a day.  . Calcium Carbonate-Vitamin D 600-400 MG-UNIT tablet Take 1 tablet by mouth 2 (two) times daily.  . cholecalciferol (VITAMIN D) 1000 UNITS tablet Take 1,000 Units by mouth at bedtime.   . citalopram (CELEXA) 10 MG tablet Take 1 tablet (10 mg total) by mouth daily.  . cyanocobalamin 500 MCG tablet Take 500 mcg by mouth daily.  Marland Kitchen Dextran 70-Hypromellose (ARTIFICIAL TEARS) 0.1-0.3 % SOLN Apply 1-2 drops to eye daily.  . diclofenac Sodium (VOLTAREN) 1 % GEL Apply 4 g topically 4 (four) times daily.  Marland Kitchen diltiazem (CARDIZEM CD) 120 MG 24 hr capsule Take 120 mg by mouth at bedtime.   Marland Kitchen diltiazem (TIAZAC) 120 MG 24 hr capsule Take 1 capsule by mouth daily.  . diphenhydrAMINE (BENADRYL) 25 MG tablet Take 25-50 mg by mouth daily as needed for itching.  . docusate sodium (COLACE) 100 MG capsule Take 200 mg by mouth at bedtime as needed for moderate constipation.   Marland Kitchen EPINEPHrine (EPIPEN 2-PAK) 0.3 mg/0.3 mL IJ SOAJ injection use as directed for severe allergy reaction  . fenofibrate 160 MG tablet take1 tab po daily for chol  . fluticasone (FLONASE) 50 MCG/ACT nasal spray Use 2 sprays in each nostril daily,  . folic acid (FOLVITE) 294 MCG tablet Take 400 mcg by mouth daily.  Marland Kitchen guaiFENesin (MUCINEX) 600 MG 12 hr tablet Take 600 mg by mouth at bedtime as needed (congestion).  . Hypromellose (ARTIFICIAL TEARS OP) Place 1-2 drops into both eyes daily as  needed (dry eyes).  Marland Kitchen ketotifen (ZADITOR) 0.025 % ophthalmic solution Place 1 drop into both eyes 2 (two) times daily as needed (allergies).  Marland Kitchen levocetirizine (XYZAL) 5 MG tablet Take 1 tablet (5 mg total) by mouth every evening.  Marland Kitchen levothyroxine (SYNTHROID, LEVOTHROID) 50 MCG tablet Take 50 mcg by mouth daily before breakfast. Brand Name only  . [EXPIRED] lidocaine (LIDODERM) 5 % Place onto the skin.  . Magnesium 250 MG TABS Take 1 tablet by mouth daily.  . Melatonin 5 MG TABS Take 1 tablet by mouth at bedtime. For sleep  . Multiple  Vitamins-Minerals (MULTIVITAMIN WITH MINERALS) tablet Take 1 tablet by mouth daily.  Marland Kitchen nystatin cream (MYCOSTATIN) Apply 1 application topically 2 (two) times daily.  . OXYGEN Inhale into the lungs. 2 litre at night  . pantoprazole (PROTONIX) 40 MG tablet Take 1 tablet (40 mg total) by mouth 2 (two) times daily.  Marland Kitchen pyridoxine (B-6) 100 MG tablet Take 100 mg by mouth daily.  . sodium chloride (OCEAN) 0.65 % SOLN nasal spray Place 1 spray into both nostrils 2 (two) times daily.  . sucralfate (CARAFATE) 1 g tablet Take 3 times daily with meals.  . Vitamin A 7.5 MG (25000 UT) CAPS Take 1 tablet by mouth 2 (two) times daily.  Marland Kitchen zinc gluconate 50 MG tablet Take 50 mg by mouth daily.  . [DISCONTINUED] albuterol (VENTOLIN HFA) 108 (90 Base) MCG/ACT inhaler Inhale 2 puffs into the lungs every 4 (four) hours as needed for wheezing or shortness of breath.  . [DISCONTINUED] diclofenac Sodium (VOLTAREN) 1 % GEL Apply 4 g topically 4 (four) times daily.  . [DISCONTINUED] fluticasone (FLONASE) 50 MCG/ACT nasal spray Use 2 sprays in each nostril daily,  . [DISCONTINUED] gabapentin (NEURONTIN) 100 MG capsule Take 100 mg by mouth 2 (two) times daily. (Patient not taking: Reported on 12/05/2020)   No facility-administered encounter medications on file as of 12/05/2020.    Surgical History: Past Surgical History:  Procedure Laterality Date  . 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 BIOPSY THYROID  2018  . bladder biopsies  2003   9 biopsies done by dr. cope.  all benign.  inflammatory process going on in bladder  . BREAST BIOPSY Right    benign  . BREAST SURGERY Right    lumpectomy  . CATARACT EXTRACTION W/PHACO Left 12/25/2014   Procedure: CATARACT EXTRACTION PHACO AND INTRAOCULAR LENS PLACEMENT (IOC);  Surgeon: Birder Robson, MD;  Location: [facility] ORS;  Service: Ophthalmology;  Laterality: Left;  [place] 00:32 AP% 20.0 CDE 6.49  . COLONOSCOPY W/  BIOPSIES    . COLONOSCOPY W/ POLYPECTOMY  2002, 2004, 2005   adenomatous polyps removed  . COLONOSCOPY WITH PROPOFOL N/A 08/23/2017   Procedure: COLONOSCOPY WITH PROPOFOL;  Surgeon: Manya Silvas, MD;  Location: [facility] ENDOSCOPY;  Service: Endoscopy;  Laterality: N/A;  . COLONOSCOPY WITH PROPOFOL N/A 11/08/2017   Procedure: COLONOSCOPY WITH PROPOFOL;  Surgeon: Manya Silvas, MD;  Location: [facility] ENDOSCOPY;  Service: Endoscopy;  Laterality: N/A;  . CYSTOCELE REPAIR N/A 04/09/2016   Procedure: ANTERIOR REPAIR (CYSTOCELE);  Surgeon: Gae Dry, MD;  Location: [facility] ORS;  Service: Gynecology;  Laterality: N/A;  . DIAGNOSTIC LAPAROSCOPY  2008   removed both ovaries with cysts, tubes and fibroids  . DILATION AND CURETTAGE OF UTERUS  1973  . ESOPHAGOGASTRODUODENOSCOPY (EGD) WITH PROPOFOL N/A 08/23/2017   Procedure: ESOPHAGOGASTRODUODENOSCOPY (EGD) WITH PROPOFOL;  Surgeon: Manya Silvas, MD;  Location: [facility] ENDOSCOPY;  Service: Endoscopy;  Laterality: N/A;  . EYE SURGERY Bilateral 2016  . HERNIA REPAIR Right 2008   Inguinal hernia Repair, ventral hernia repair  . JOINT REPLACEMENT Right 2008   knee  . KNEE ARTHROSCOPY Right   . LUMBAR LAMINECTOMY/DECOMPRESSION MICRODISCECTOMY Right 03/21/2013   Procedure: Right Lumbar five-Sacral one Laminectomy for Synovial Cyst;  Surgeon: Faythe Ghee, MD;  Location: [facility] NEURO ORS;  Service: Neurosurgery;  Laterality: Right;  right  . OOPHORECTOMY    . REVERSE SHOULDER ARTHROPLASTY Right 02/22/2018   Procedure: REVERSE SHOULDER ARTHROPLASTY;  Surgeon: Corky Mull, MD;  Location: [facility] ORS;  Service: Orthopedics;  Laterality: Right;  . TUBAL LIGATION    . UNILATERAL SALPINGECTOMY Left 04/09/2016   Procedure: UNILATERAL SALPINGECTOMY;  Surgeon: Gae Dry, MD;  Location: [facility] ORS;  Service: Gynecology;  Laterality: Left;  . UPPER GI ENDOSCOPY  2010   with biopsy of gastric erosion  . VAGINAL HYSTERECTOMY N/A 04/09/2016   Procedure: HYSTERECTOMY  VAGINAL;  Surgeon: Gae Dry, MD;  Location: [facility] ORS;  Service: Gynecology;  Laterality: N/A;  . VAGINAL HYSTERECTOMY  2017   and bladder tack    Medical History: Past Medical History:  Diagnosis Date  . Arthritis    osteo  . Asthma    needs rescue inhaler few times a year  . Claustrophobia 04/21/2020  . Depression   . Dizziness    light headed spells but it has been a while  . Dysrhythmia    tachycardia.(cardizem). brady during procedures  . Fibromyalgia   . GERD (gastroesophageal reflux disease)    gastritis  . Headache(784.0)   . Heart murmur 2019   aortic. dr. Nehemiah Massed not worried about this  . Hyperlipidemia   . Hypertension   . Hypothyroidism   . Macular degeneration   . Migraine   . Orthopnea   . Pneumonia    long time ago (greater than 5 years ago)  . PONV (postoperative nausea and vomiting)    very anxious during cataract surgery. brady during colonoscopy  . Shortness of breath    any exertion, cannot lie flat    Family History: Family History  Problem Relation Age of Onset  . Pulmonary fibrosis Mother   . Melanoma Father   . Cardiomyopathy Sister        and arrhythmia  . Breast cancer Cousin        paternal 1st    Social History   Socioeconomic History  . Marital status: Married    Spouse name: Not on file  . Number of children: Not on file  . Years of education: Not on file  . Highest education level: Not on file  Occupational History  . Not on file  Tobacco Use  . Smoking status: Former Smoker    Types: Cigarettes    Quit date: 1967    Years since quitting: 55.3  . Smokeless tobacco: Never Used  Vaping Use  . Vaping Use: Never used  Substance and Sexual Activity  . Alcohol use: Not Currently  . Drug use: No  . Sexual activity: Not Currently  Other Topics Concern  . Not on file  Social History Narrative  . Not on file   Social Determinants of Health   Financial Resource Strain: Not on file  Food Insecurity: Not on file   Transportation Needs: Not on file  Physical Activity: Not on file  Stress: Not on file  Social  Connections: Not on file  Intimate Partner Violence: Not on file      Review of Systems  Constitutional: Positive for fatigue. Negative for chills and unexpected weight change.  HENT: Negative for congestion, postnasal drip, rhinorrhea, sneezing and sore throat.   Eyes: Negative for redness.  Respiratory: Negative for cough, chest tightness, shortness of breath and wheezing.   Cardiovascular: Negative for chest pain and palpitations.  Gastrointestinal: Negative for abdominal pain, constipation, diarrhea, nausea and vomiting.  Genitourinary: Negative for dysuria and frequency.  Musculoskeletal: Positive for arthralgias and back pain. Negative for joint swelling and neck pain.  Skin: Negative for rash.  Neurological: Negative.  Negative for tremors and numbness.  Hematological: Negative for adenopathy. Does not bruise/bleed easily.  Psychiatric/Behavioral: Negative for behavioral problems (Depression), sleep disturbance and suicidal ideas. The patient is not nervous/anxious.     Vital Signs: BP 136/66   Pulse 80   Temp (!) 97.3 F (36.3 C)   Resp 16   Ht 5' (1.524 m)   Wt 153 lb 3.2 oz (69.5 kg)   SpO2 97%   BMI 29.92 kg/m    Physical Exam Vitals and nursing note reviewed.  Constitutional:      General: She is not in acute distress.    Appearance: She is well-developed. She is not diaphoretic.  HENT:     Head: Normocephalic and atraumatic.     Mouth/Throat:     Pharynx: No oropharyngeal exudate.  Eyes:     Pupils: Pupils are equal, round, and reactive to light.  Neck:     Thyroid: No thyromegaly.     Vascular: No JVD.     Trachea: No tracheal deviation.  Cardiovascular:     Rate and Rhythm: Normal rate and regular rhythm.     Heart sounds: Normal heart sounds. No murmur heard. No friction rub. No gallop.   Pulmonary:     Effort: Pulmonary effort is normal. No  respiratory distress.     Breath sounds: No wheezing or rales.  Chest:     Chest wall: No tenderness.  Abdominal:     General: Bowel sounds are normal.     Palpations: Abdomen is soft.  Musculoskeletal:        General: Normal range of motion.     Cervical back: Normal range of motion and neck supple.  Lymphadenopathy:     Cervical: No cervical adenopathy.  Skin:    General: Skin is warm and dry.  Neurological:     Mental Status: She is alert and oriented to person, place, and time.     Cranial Nerves: No cranial nerve deficit.  Psychiatric:        Behavior: Behavior normal.        Thought Content: Thought content normal.        Judgment: Judgment normal.        Assessment/Plan: 1. Benign essential HTN Stable continue current medications.  Followed by cardiology  2. Inflammatory polyarthritis ([place]) May continue Voltaren topical gel as needed - diclofenac Sodium (VOLTAREN) 1 % GEL; Apply 4 g topically 4 (four) times daily.  Dispense: 500 g; Refill: 3  3. Mild intermittent asthma without complication Continue inhalers as prescribed, followed by pulmonology - albuterol (VENTOLIN HFA) 108 (90 Base) MCG/ACT inhaler; Inhale 2 puffs into the lungs every 4 (four) hours as needed for wheezing or shortness of breath.  Dispense: 18 g; Refill: 5  4. Allergic rhinitis due to pollen, unspecified seasonality Flonase refill sent continue to control allergies -  fluticasone (FLONASE) 50 MCG/ACT nasal spray; Use 2 sprays in each nostril daily,  Dispense: 16 g; Refill: 2  5. Moderate episode of recurrent major depressive disorder (HCC) Stable, continue citalopram  6. Osteopenia of lumbar spine Continue to supplement calcium and vitamin D.  Patient has appointment with neurosurgery to address back pain.   General Counseling: Simranjit verbalizes understanding of the findings of todays visit and agrees with plan of treatment. I have discussed any further diagnostic evaluation that may be  needed or ordered today. We also reviewed her medications today. she has been encouraged to call the office with any questions or concerns that should arise related to todays visit.    No orders of the defined types were placed in this encounter.   Meds ordered this encounter  Medications  . diclofenac Sodium (VOLTAREN) 1 % GEL    Sig: Apply 4 g topically 4 (four) times daily.    Dispense:  500 g    Refill:  3  . albuterol (VENTOLIN HFA) 108 (90 Base) MCG/ACT inhaler    Sig: Inhale 2 puffs into the lungs every 4 (four) hours as needed for wheezing or shortness of breath.    Dispense:  18 g    Refill:  5  . fluticasone (FLONASE) 50 MCG/ACT nasal spray    Sig: Use 2 sprays in each nostril daily,    Dispense:  16 g    Refill:  2    This patient was seen by Drema Dallas, PA-C in collaboration with Dr. Clayborn Bigness as a part of collaborative care agreement.   Total time spent:30 Minutes Time spent includes review of chart, medications, test results, and follow up plan with the patient.      Dr Lavera Guise Internal medicine

## 2020-12-06 ENCOUNTER — Ambulatory Visit (INDEPENDENT_AMBULATORY_CARE_PROVIDER_SITE_OTHER): Payer: Medicare Other | Admitting: Podiatry

## 2020-12-06 ENCOUNTER — Encounter: Payer: Self-pay | Admitting: Podiatry

## 2020-12-06 DIAGNOSIS — M7751 Other enthesopathy of right foot: Secondary | ICD-10-CM | POA: Diagnosis not present

## 2020-12-06 DIAGNOSIS — M7672 Peroneal tendinitis, left leg: Secondary | ICD-10-CM | POA: Diagnosis not present

## 2020-12-06 DIAGNOSIS — M7752 Other enthesopathy of left foot: Secondary | ICD-10-CM

## 2020-12-10 ENCOUNTER — Ambulatory Visit: Payer: BLUE CROSS/BLUE SHIELD | Admitting: Podiatry

## 2020-12-11 DIAGNOSIS — U071 COVID-19: Secondary | ICD-10-CM | POA: Diagnosis not present

## 2020-12-11 DIAGNOSIS — Z23 Encounter for immunization: Secondary | ICD-10-CM | POA: Diagnosis not present

## 2020-12-19 DIAGNOSIS — M5416 Radiculopathy, lumbar region: Secondary | ICD-10-CM | POA: Diagnosis not present

## 2020-12-23 DIAGNOSIS — M5416 Radiculopathy, lumbar region: Secondary | ICD-10-CM | POA: Diagnosis not present

## 2020-12-24 MED ORDER — BETAMETHASONE SOD PHOS & ACET 6 (3-3) MG/ML IJ SUSP: 3.0000 mg | Freq: Once | INTRAMUSCULAR | Status: DC

## 2020-12-24 NOTE — Progress Notes (Signed)
   Subjective:  75 y.o. female presenting today for new complaint regarding bilateral ankle pain as well as left foot pain.  Patient states that she is experiencing a lot of pain and swelling to the bilateral ankles.  She had had x-rays performed at another physician's office and she would like to have a second opinion.  She has had cortisone injections in the past and ankle braces that have helped temporarily.  She presents for further treatment and evaluation   Past Medical History:  Diagnosis Date  . Arthritis    osteo  . Asthma    needs rescue inhaler few times a year  . Claustrophobia 04/21/2020  . Depression   . Dizziness    light headed spells but it has been a while  . Dysrhythmia    tachycardia.(cardizem). brady during procedures  . Fibromyalgia   . GERD (gastroesophageal reflux disease)    gastritis  . Headache(784.0)   . Heart murmur 2019   aortic. dr. Nehemiah Massed not worried about this  . Hyperlipidemia   . Hypertension   . Hypothyroidism   . Macular degeneration   . Migraine   . Orthopnea   . Pneumonia    long time ago (greater than 5 years ago)  . PONV (postoperative nausea and vomiting)    very anxious during cataract surgery. brady during colonoscopy  . Shortness of breath    any exertion, cannot lie flat     Objective / Physical Exam:  General:  The patient is alert and oriented x3 in no acute distress. Dermatology:  Skin is warm, dry and supple bilateral lower extremities. Negative for open lesions or macerations. Vascular:  Palpable pedal pulses bilaterally. No edema or erythema noted. Capillary refill within normal limits. Neurological:  Epicritic and protective threshold grossly intact bilaterally.  Musculoskeletal Exam:  Pain on palpation to the anterior lateral medial aspects of the patient's bilateral ankles. Mild edema noted. Range of motion within normal limits to all pedal and ankle joints bilateral. Muscle strength 5/5 in all groups bilateral.    Radiographic Exam:  Normal osseous mineralization. Joint spaces preserved. No fracture/dislocation/boney destruction.    Assessment: 1. Capsulitis of ankles bilateral 2. Insertional peroneal tendinitis left foot  Plan of Care:  1. Patient was evaluated. X-Rays reviewed.  2. Injection of 0.5 mL Celestone Soluspan injected in the patient's bilateral ankle joints as well as the insertional area of the peroneal tendon left 3.  Patient cannot take oral NSAIDs 4.  Compression ankle sleeve dispensed to wear daily 5.  Recommend cam boot x1 week to the right lower extremity.  Patient has a cam boot at home 6.  Return to clinic as needed   Edrick Kins, DPM [facility]  Dr. Edrick Kins, [address]                                        [place], [place] [address]                Office [phone]  Fax [phone]

## 2020-12-26 DIAGNOSIS — R0600 Dyspnea, unspecified: Secondary | ICD-10-CM | POA: Diagnosis not present

## 2020-12-26 DIAGNOSIS — R079 Chest pain, unspecified: Secondary | ICD-10-CM | POA: Diagnosis not present

## 2020-12-27 DIAGNOSIS — M4310 Spondylolisthesis, site unspecified: Secondary | ICD-10-CM | POA: Diagnosis not present

## 2020-12-27 DIAGNOSIS — M5416 Radiculopathy, lumbar region: Secondary | ICD-10-CM | POA: Diagnosis not present

## 2020-12-31 ENCOUNTER — Other Ambulatory Visit: Payer: Self-pay | Admitting: Neurosurgery

## 2020-12-31 DIAGNOSIS — M4310 Spondylolisthesis, site unspecified: Secondary | ICD-10-CM

## 2020-12-31 DIAGNOSIS — M5416 Radiculopathy, lumbar region: Secondary | ICD-10-CM

## 2021-01-01 DIAGNOSIS — E782 Mixed hyperlipidemia: Secondary | ICD-10-CM | POA: Diagnosis not present

## 2021-01-01 DIAGNOSIS — I700 Atherosclerosis of aorta: Secondary | ICD-10-CM | POA: Diagnosis not present

## 2021-01-01 DIAGNOSIS — I10 Essential (primary) hypertension: Secondary | ICD-10-CM | POA: Diagnosis not present

## 2021-01-01 DIAGNOSIS — I25119 Atherosclerotic heart disease of native coronary artery with unspecified angina pectoris: Secondary | ICD-10-CM | POA: Diagnosis not present

## 2021-01-02 DIAGNOSIS — K279 Peptic ulcer, site unspecified, unspecified as acute or chronic, without hemorrhage or perforation: Secondary | ICD-10-CM | POA: Diagnosis not present

## 2021-01-02 DIAGNOSIS — M19071 Primary osteoarthritis, right ankle and foot: Secondary | ICD-10-CM | POA: Diagnosis not present

## 2021-01-02 DIAGNOSIS — I25119 Atherosclerotic heart disease of native coronary artery with unspecified angina pectoris: Secondary | ICD-10-CM | POA: Diagnosis not present

## 2021-01-03 ENCOUNTER — Telehealth: Payer: Self-pay

## 2021-01-03 ENCOUNTER — Other Ambulatory Visit (HOSPITAL_COMMUNITY): Payer: Self-pay | Admitting: Podiatry

## 2021-01-03 ENCOUNTER — Other Ambulatory Visit: Payer: Self-pay | Admitting: Physician Assistant

## 2021-01-03 ENCOUNTER — Other Ambulatory Visit: Payer: Self-pay | Admitting: Podiatry

## 2021-01-03 DIAGNOSIS — F40240 Claustrophobia: Secondary | ICD-10-CM

## 2021-01-03 DIAGNOSIS — M19071 Primary osteoarthritis, right ankle and foot: Secondary | ICD-10-CM

## 2021-01-03 MED ORDER — DIAZEPAM 5 MG PO TABS: ORAL_TABLET | ORAL | 0 refills | Status: DC

## 2021-01-03 NOTE — Telephone Encounter (Signed)
Patient called in asking for something to help her relax during her 01/10/21 MRI since she is claustrophobic. I sent message to Lauren-Toni

## 2021-01-03 NOTE — Telephone Encounter (Signed)
lvm letting patient know rx was called into her pharmacy for her upcoming MRI-Vanessa Evans

## 2021-01-08 ENCOUNTER — Ambulatory Visit
Admission: RE | Admit: 2021-01-08 | Discharge: 2021-01-08 | Disposition: A | Discharge status: Home / Self Care | Payer: Medicare Other | Source: Ambulatory Visit | Attending: Podiatry | Admitting: Podiatry

## 2021-01-08 ENCOUNTER — Other Ambulatory Visit: Payer: Self-pay

## 2021-01-08 DIAGNOSIS — Q666 Other congenital valgus deformities of feet: Secondary | ICD-10-CM | POA: Diagnosis not present

## 2021-01-08 DIAGNOSIS — M19071 Primary osteoarthritis, right ankle and foot: Secondary | ICD-10-CM | POA: Insufficient documentation

## 2021-01-08 DIAGNOSIS — M7731 Calcaneal spur, right foot: Secondary | ICD-10-CM | POA: Diagnosis not present

## 2021-01-10 ENCOUNTER — Other Ambulatory Visit: Payer: Self-pay

## 2021-01-10 ENCOUNTER — Ambulatory Visit
Admission: RE | Admit: 2021-01-10 | Discharge: 2021-01-10 | Disposition: A | Discharge status: Home / Self Care | Payer: Medicare Other | Source: Ambulatory Visit | Attending: Neurosurgery | Admitting: Neurosurgery

## 2021-01-10 DIAGNOSIS — M4310 Spondylolisthesis, site unspecified: Secondary | ICD-10-CM | POA: Diagnosis not present

## 2021-01-10 DIAGNOSIS — M545 Low back pain, unspecified: Secondary | ICD-10-CM | POA: Diagnosis not present

## 2021-01-10 DIAGNOSIS — M5416 Radiculopathy, lumbar region: Secondary | ICD-10-CM | POA: Insufficient documentation

## 2021-01-13 DIAGNOSIS — Z96611 Presence of right artificial shoulder joint: Secondary | ICD-10-CM | POA: Diagnosis not present

## 2021-01-13 DIAGNOSIS — M5416 Radiculopathy, lumbar region: Secondary | ICD-10-CM | POA: Diagnosis not present

## 2021-01-13 DIAGNOSIS — M5412 Radiculopathy, cervical region: Secondary | ICD-10-CM | POA: Diagnosis not present

## 2021-01-13 DIAGNOSIS — M47812 Spondylosis without myelopathy or radiculopathy, cervical region: Secondary | ICD-10-CM | POA: Diagnosis not present

## 2021-01-14 DIAGNOSIS — M5416 Radiculopathy, lumbar region: Secondary | ICD-10-CM | POA: Diagnosis not present

## 2021-01-14 DIAGNOSIS — M48062 Spinal stenosis, lumbar region with neurogenic claudication: Secondary | ICD-10-CM | POA: Diagnosis not present

## 2021-01-20 ENCOUNTER — Ambulatory Visit: Payer: Medicare Other | Admitting: Internal Medicine

## 2021-01-20 DIAGNOSIS — H353211 Exudative age-related macular degeneration, right eye, with active choroidal neovascularization: Secondary | ICD-10-CM | POA: Diagnosis not present

## 2021-01-20 DIAGNOSIS — H35372 Puckering of macula, left eye: Secondary | ICD-10-CM | POA: Diagnosis not present

## 2021-01-21 ENCOUNTER — Ambulatory Visit: Payer: Medicare Other | Admitting: Internal Medicine

## 2021-01-21 ENCOUNTER — Other Ambulatory Visit: Payer: Self-pay | Admitting: Physician Assistant

## 2021-01-21 DIAGNOSIS — M5416 Radiculopathy, lumbar region: Secondary | ICD-10-CM | POA: Diagnosis not present

## 2021-01-21 DIAGNOSIS — Z1231 Encounter for screening mammogram for malignant neoplasm of breast: Secondary | ICD-10-CM

## 2021-01-24 ENCOUNTER — Other Ambulatory Visit: Payer: Self-pay

## 2021-01-24 ENCOUNTER — Ambulatory Visit
Admission: RE | Admit: 2021-01-24 | Discharge: 2021-01-24 | Disposition: A | Discharge status: Home / Self Care | Payer: Medicare Other | Source: Ambulatory Visit | Attending: Physician Assistant | Admitting: Physician Assistant

## 2021-01-24 DIAGNOSIS — Z1231 Encounter for screening mammogram for malignant neoplasm of breast: Secondary | ICD-10-CM | POA: Insufficient documentation

## 2021-01-24 DIAGNOSIS — M5416 Radiculopathy, lumbar region: Secondary | ICD-10-CM | POA: Diagnosis not present

## 2021-01-27 ENCOUNTER — Ambulatory Visit (INDEPENDENT_AMBULATORY_CARE_PROVIDER_SITE_OTHER): Payer: Medicare Other | Admitting: Physician Assistant

## 2021-01-27 ENCOUNTER — Other Ambulatory Visit: Payer: Self-pay

## 2021-01-27 ENCOUNTER — Encounter: Payer: Self-pay | Admitting: Physician Assistant

## 2021-01-27 DIAGNOSIS — N3001 Acute cystitis with hematuria: Secondary | ICD-10-CM

## 2021-01-27 DIAGNOSIS — R300 Dysuria: Secondary | ICD-10-CM | POA: Diagnosis not present

## 2021-01-27 LAB — POCT URINALYSIS DIPSTICK
Bilirubin, UA: NEGATIVE
Glucose, UA: NEGATIVE
Ketones, UA: NEGATIVE
Nitrite, UA: POSITIVE
Protein, UA: NEGATIVE
Spec Grav, UA: 1.01 (ref 1.010–1.025)
Urobilinogen, UA: 0.2 E.U./dL
pH, UA: 7 (ref 5.0–8.0)

## 2021-01-27 MED ORDER — AMOXICILLIN-POT CLAVULANATE 875-125 MG PO TABS: 1.0000 | ORAL_TABLET | Freq: Two times a day (BID) | ORAL | 0 refills | Status: DC

## 2021-01-27 NOTE — Progress Notes (Signed)
[facility] [place], [place] [address]  Internal MEDICINE  Office Visit Note  Patient Name: Vanessa Evans  [id number]  [id number]  Date of Service: 01/27/2021  Chief Complaint  Patient presents with   Acute Visit    UTI hair lost    HPI Pt is here for sick visit. -Symptoms off and on of UTI for a few weeks, so didn't come in because would go away for a day then resume. She is experiencing cramping low abdominal pain, frequency, low volume of urine, and mild left side back pain. Had eipdural for her chronic back pain at baseline a few weeks ago, but maybe increase backpain on left recently that is different from normal pain. Denies any fever, chills, or noticeable blood in urine.  Current Medication: Outpatient Encounter Medications as of 01/27/2021  Medication Sig   amoxicillin-clavulanate (AUGMENTIN) 875-125 MG tablet Take 1 tablet by mouth 2 (two) times daily.   acetaminophen (TYLENOL) 500 MG tablet Take 1,000 mg by mouth 2 (two) times daily as needed for moderate pain.   albuterol (PROVENTIL) (2.5 MG/3ML) 0.083% nebulizer solution Take 3 mLs (2.5 mg total) by nebulization every 6 (six) hours as needed for wheezing or shortness of breath.   albuterol (VENTOLIN HFA) 108 (90 Base) MCG/ACT inhaler Inhale 2 puffs into the lungs every 4 (four) hours as needed for wheezing or shortness of breath.   aspirin EC 81 MG tablet Take 81 mg by mouth at bedtime.    benzonatate (TESSALON) 100 MG capsule Take 2 capsules (200 mg total) by mouth 2 (two) times daily as needed for cough.   BLACK ELDERBERRY PO Take 1 Dose by mouth 2 (two) times a day.   Calcium Carbonate-Vitamin D 600-400 MG-UNIT tablet Take 1 tablet by mouth 2 (two) times daily.   cholecalciferol (VITAMIN D) 1000 UNITS tablet Take 1,000 Units by mouth at bedtime.    citalopram (CELEXA) 10 MG tablet Take 1 tablet (10 mg total) by mouth daily.   cyanocobalamin 500 MCG tablet Take 500 mcg by mouth daily.    Dextran 70-Hypromellose (ARTIFICIAL TEARS) 0.1-0.3 % SOLN Apply 1-2 drops to eye daily.   diazepam (VALIUM) 5 MG tablet Take 1 tablet po one time two hours prior to procedure. May repeat dose prior to leaving home for procedure if symptoms are persistent. Patient must have driver to and from procedure   diclofenac Sodium (VOLTAREN) 1 % GEL Apply 4 g topically 4 (four) times daily.   diltiazem (CARDIZEM CD) 120 MG 24 hr capsule Take 120 mg by mouth at bedtime.    diltiazem (TIAZAC) 120 MG 24 hr capsule Take 1 capsule by mouth daily.   diphenhydrAMINE (BENADRYL) 25 MG tablet Take 25-50 mg by mouth daily as needed for itching.   docusate sodium (COLACE) 100 MG capsule Take 200 mg by mouth at bedtime as needed for moderate constipation.    EPINEPHrine (EPIPEN 2-PAK) 0.3 mg/0.3 mL IJ SOAJ injection use as directed for severe allergy reaction   fenofibrate 160 MG tablet take1 tab po daily for chol   fluticasone (FLONASE) 50 MCG/ACT nasal spray Use 2 sprays in each nostril daily,   folic acid (FOLVITE) 975 MCG tablet Take 400 mcg by mouth daily.   guaiFENesin (MUCINEX) 600 MG 12 hr tablet Take 600 mg by mouth at bedtime as needed (congestion).   Hypromellose (ARTIFICIAL TEARS OP) Place 1-2 drops into both eyes daily as needed (dry eyes).   ketotifen (ZADITOR) 0.025 % ophthalmic solution  Place 1 drop into both eyes 2 (two) times daily as needed (allergies).   levocetirizine (XYZAL) 5 MG tablet Take 1 tablet (5 mg total) by mouth every evening.   levothyroxine (SYNTHROID, LEVOTHROID) 50 MCG tablet Take 50 mcg by mouth daily before breakfast. Brand Name only   Magnesium 250 MG TABS Take 1 tablet by mouth daily.   Melatonin 5 MG TABS Take 1 tablet by mouth at bedtime. For sleep   Multiple Vitamins-Minerals (MULTIVITAMIN WITH MINERALS) tablet Take 1 tablet by mouth daily.   nystatin cream (MYCOSTATIN) Apply 1 application topically 2 (two) times daily.   OXYGEN Inhale into the lungs. 2 litre at night    pantoprazole (PROTONIX) 40 MG tablet Take 1 tablet (40 mg total) by mouth 2 (two) times daily.   pregabalin (LYRICA) 25 MG capsule Take by mouth.   pyridoxine (B-6) 100 MG tablet Take 100 mg by mouth daily.   sodium chloride (OCEAN) 0.65 % SOLN nasal spray Place 1 spray into both nostrils 2 (two) times daily.   sucralfate (CARAFATE) 1 g tablet Take 3 times daily with meals.   Vitamin A 7.5 MG (25000 UT) CAPS Take 1 tablet by mouth 2 (two) times daily.   zinc gluconate 50 MG tablet Take 50 mg by mouth daily.   Facility-Administered Encounter Medications as of 01/27/2021  Medication   betamethasone acetate-betamethasone sodium phosphate (CELESTONE) injection 3 mg    Surgical History: Past Surgical History:  Procedure Laterality Date   BACK SURGERY  2014   removed bone to get to a benign tumor that was pushing on spinal column   BIOPSY THYROID  2018   bladder biopsies  2003   9 biopsies done by dr. cope.  all benign.  inflammatory process going on in bladder   BREAST BIOPSY Right    benign   BREAST SURGERY Right    lumpectomy   CATARACT EXTRACTION W/PHACO Left 12/25/2014   Procedure: CATARACT EXTRACTION PHACO AND INTRAOCULAR LENS PLACEMENT ([place]);  Surgeon: Birder Robson, MD;  Location: [facility] ORS;  Service: Ophthalmology;  Laterality: Left;  [place] 00:32 AP% 20.0 CDE 6.49   COLONOSCOPY W/ BIOPSIES     COLONOSCOPY W/ POLYPECTOMY  2002, 2004, 2005   adenomatous polyps removed   COLONOSCOPY WITH PROPOFOL N/A 08/23/2017   Procedure: COLONOSCOPY WITH PROPOFOL;  Surgeon: Manya Silvas, MD;  Location: [facility] ENDOSCOPY;  Service: Endoscopy;  Laterality: N/A;   COLONOSCOPY WITH PROPOFOL N/A 11/08/2017   Procedure: COLONOSCOPY WITH PROPOFOL;  Surgeon: Manya Silvas, MD;  Location: [facility] ENDOSCOPY;  Service: Endoscopy;  Laterality: N/A;   CYSTOCELE REPAIR N/A 04/09/2016   Procedure: ANTERIOR REPAIR (CYSTOCELE);  Surgeon: Gae Dry, MD;  Location: [facility] ORS;  Service: Gynecology;  Laterality:  N/A;   DIAGNOSTIC LAPAROSCOPY  2008   removed both ovaries with cysts, tubes and fibroids   DILATION AND CURETTAGE OF UTERUS  1973   ESOPHAGOGASTRODUODENOSCOPY (EGD) WITH PROPOFOL N/A 08/23/2017   Procedure: ESOPHAGOGASTRODUODENOSCOPY (EGD) WITH PROPOFOL;  Surgeon: Manya Silvas, MD;  Location: [facility] ENDOSCOPY;  Service: Endoscopy;  Laterality: N/A;   EYE SURGERY Bilateral 2016   HERNIA REPAIR Right 2008   Inguinal hernia Repair, ventral hernia repair   JOINT REPLACEMENT Right 2008   knee   KNEE ARTHROSCOPY Right    LUMBAR LAMINECTOMY/DECOMPRESSION MICRODISCECTOMY Right 03/21/2013   Procedure: Right Lumbar five-Sacral one Laminectomy for Synovial Cyst;  Surgeon: Faythe Ghee, MD;  Location: [facility] NEURO ORS;  Service: Neurosurgery;  Laterality: Right;  right  OOPHORECTOMY     REVERSE SHOULDER ARTHROPLASTY Right 02/22/2018   Procedure: REVERSE SHOULDER ARTHROPLASTY;  Surgeon: Corky Mull, MD;  Location: [facility] ORS;  Service: Orthopedics;  Laterality: Right;   TUBAL LIGATION     UNILATERAL SALPINGECTOMY Left 04/09/2016   Procedure: UNILATERAL SALPINGECTOMY;  Surgeon: Gae Dry, MD;  Location: [facility] ORS;  Service: Gynecology;  Laterality: Left;   UPPER GI ENDOSCOPY  2010   with biopsy of gastric erosion   VAGINAL HYSTERECTOMY N/A 04/09/2016   Procedure: HYSTERECTOMY VAGINAL;  Surgeon: Gae Dry, MD;  Location: [facility] ORS;  Service: Gynecology;  Laterality: N/A;   VAGINAL HYSTERECTOMY  2017   and bladder tack    Medical History: Past Medical History:  Diagnosis Date   Arthritis    osteo   Asthma    needs rescue inhaler few times a year   Claustrophobia 04/21/2020   Depression    Dizziness    light headed spells but it has been a while   Dysrhythmia    tachycardia.(cardizem). brady during procedures   Fibromyalgia    GERD (gastroesophageal reflux disease)    gastritis   Headache(784.0)    Heart murmur 2019   aortic. dr. Nehemiah Massed not worried about this   Hyperlipidemia     Hypertension    Hypothyroidism    Macular degeneration    Migraine    Orthopnea    Pneumonia    long time ago (greater than 5 years ago)   PONV (postoperative nausea and vomiting)    very anxious during cataract surgery. brady during colonoscopy   Shortness of breath    any exertion, cannot lie flat    Family History: Family History  Problem Relation Age of Onset   Pulmonary fibrosis Mother    Melanoma Father    Cardiomyopathy Sister        and arrhythmia   Breast cancer Cousin        paternal 1st    Social History   Socioeconomic History   Marital status: Married    Spouse name: Not on file   Number of children: Not on file   Years of education: Not on file   Highest education level: Not on file  Occupational History   Not on file  Tobacco Use   Smoking status: Former    Pack years: 0.00    Types: Cigarettes    Quit date: 78    Years since quitting: 55.5   Smokeless tobacco: Never  Vaping Use   Vaping Use: Never used  Substance and Sexual Activity   Alcohol use: Not Currently   Drug use: No   Sexual activity: Not Currently  Other Topics Concern   Not on file  Social History Narrative   Not on file   Social Determinants of Health   Financial Resource Strain: Not on file  Food Insecurity: Not on file  Transportation Needs: Not on file  Physical Activity: Not on file  Stress: Not on file  Social Connections: Not on file  Intimate Partner Violence: Not on file      Review of Systems  Constitutional:  Negative for fatigue and fever.  HENT:  Negative for congestion, mouth sores and postnasal drip.   Respiratory:  Negative for cough.   Cardiovascular:  Negative for chest pain.  Gastrointestinal:  Positive for abdominal pain.  Genitourinary:  Positive for dysuria, flank pain and frequency. Negative for hematuria and urgency.  Psychiatric/Behavioral: Negative.     Vital Signs: BP 140/78  Pulse (!) 102   Temp 98.5 F (36.9 C)   Resp 16   Ht  5' (1.524 m)   Wt 153 lb 9.6 oz (69.7 kg)   SpO2 96%   BMI 30.00 kg/m    Physical Exam Vitals and nursing note reviewed.  Constitutional:      General: She is not in acute distress.    Appearance: She is well-developed. She is obese. She is not diaphoretic.  HENT:     Head: Normocephalic and atraumatic.     Mouth/Throat:     Pharynx: No oropharyngeal exudate.  Eyes:     Pupils: Pupils are equal, round, and reactive to light.  Neck:     Thyroid: No thyromegaly.     Vascular: No JVD.     Trachea: No tracheal deviation.  Cardiovascular:     Rate and Rhythm: Normal rate and regular rhythm.     Heart sounds: Normal heart sounds. No murmur heard.   No friction rub. No gallop.  Pulmonary:     Effort: Pulmonary effort is normal. No respiratory distress.     Breath sounds: No wheezing or rales.  Chest:     Chest wall: No tenderness.  Abdominal:     General: Bowel sounds are normal.     Palpations: Abdomen is soft.     Tenderness: There is no abdominal tenderness. There is left CVA tenderness.  Musculoskeletal:        General: Normal range of motion.     Cervical back: Normal range of motion and neck supple.  Lymphadenopathy:     Cervical: No cervical adenopathy.  Skin:    General: Skin is warm and dry.  Neurological:     Mental Status: She is alert and oriented to person, place, and time.     Cranial Nerves: No cranial nerve deficit.  Psychiatric:        Behavior: Behavior normal.        Thought Content: Thought content normal.        Judgment: Judgment normal.       Assessment/Plan: 1. Acute cystitis with hematuria Will start on augmentin--chosen bc pt tolerates this well and has many allergies to other ABX. Did mention that she Macrobid could be retried in future if needed, since the diarrhea she experienced the first time she tried it did not happen the following encounter with it. Educated to stay well hydrated. Will send for culture and adjust ABX as indicated.  Consider urology referral if continued symptoms due to multiple UTI in the past few months. - amoxicillin-clavulanate (AUGMENTIN) 875-125 MG tablet; Take 1 tablet by mouth 2 (two) times daily.  Dispense: 14 tablet; Refill: 0  2. Dysuria - POCT Urinalysis Dipstick - CULTURE, URINE COMPREHENSIVE   General Counseling: Pierre verbalizes understanding of the findings of todays visit and agrees with plan of treatment. I have discussed any further diagnostic evaluation that may be needed or ordered today. We also reviewed her medications today. she has been encouraged to call the office with any questions or concerns that should arise related to todays visit.    Orders Placed This Encounter  Procedures   CULTURE, URINE COMPREHENSIVE   POCT Urinalysis Dipstick    Meds ordered this encounter  Medications   amoxicillin-clavulanate (AUGMENTIN) 875-125 MG tablet    Sig: Take 1 tablet by mouth 2 (two) times daily.    Dispense:  14 tablet    Refill:  0    This patient was seen by Ander Purpura  Braxdon Gappa, PA-C in collaboration with Dr. Clayborn Bigness as a part of collaborative care agreement.   Total time spent:30 Minutes Time spent includes review of chart, medications, test results, and follow up plan with the patient.      Dr Lavera Guise Internal medicine

## 2021-01-31 ENCOUNTER — Other Ambulatory Visit: Payer: Self-pay | Admitting: Internal Medicine

## 2021-01-31 ENCOUNTER — Telehealth: Payer: Self-pay

## 2021-01-31 DIAGNOSIS — M5416 Radiculopathy, lumbar region: Secondary | ICD-10-CM | POA: Diagnosis not present

## 2021-01-31 LAB — CULTURE, URINE COMPREHENSIVE

## 2021-01-31 MED ORDER — PHENAZOPYRIDINE HCL 200 MG PO TABS: ORAL_TABLET | ORAL | 0 refills | Status: DC

## 2021-01-31 NOTE — Telephone Encounter (Signed)
Ms. Mosely called complaining that she is having discomfort on her side. Believes it may be that her UTI has affected her kidneys. Spoke with DFK she will send in a medication for the pt. She will send PYRIDIUM 200MG .

## 2021-02-02 DIAGNOSIS — M545 Low back pain, unspecified: Secondary | ICD-10-CM | POA: Diagnosis not present

## 2021-02-02 DIAGNOSIS — R3915 Urgency of urination: Secondary | ICD-10-CM | POA: Diagnosis not present

## 2021-02-02 DIAGNOSIS — R350 Frequency of micturition: Secondary | ICD-10-CM | POA: Diagnosis not present

## 2021-02-02 DIAGNOSIS — R109 Unspecified abdominal pain: Secondary | ICD-10-CM | POA: Diagnosis not present

## 2021-02-03 DIAGNOSIS — M5416 Radiculopathy, lumbar region: Secondary | ICD-10-CM | POA: Diagnosis not present

## 2021-02-06 ENCOUNTER — Other Ambulatory Visit: Payer: Self-pay | Admitting: Internal Medicine

## 2021-02-06 DIAGNOSIS — E782 Mixed hyperlipidemia: Secondary | ICD-10-CM

## 2021-02-12 DIAGNOSIS — M5416 Radiculopathy, lumbar region: Secondary | ICD-10-CM | POA: Diagnosis not present

## 2021-02-14 DIAGNOSIS — Z20822 Contact with and (suspected) exposure to covid-19: Secondary | ICD-10-CM | POA: Diagnosis not present

## 2021-02-21 DIAGNOSIS — M7062 Trochanteric bursitis, left hip: Secondary | ICD-10-CM | POA: Diagnosis not present

## 2021-02-21 DIAGNOSIS — M48062 Spinal stenosis, lumbar region with neurogenic claudication: Secondary | ICD-10-CM | POA: Diagnosis not present

## 2021-02-21 DIAGNOSIS — M5416 Radiculopathy, lumbar region: Secondary | ICD-10-CM | POA: Diagnosis not present

## 2021-02-21 DIAGNOSIS — M5136 Other intervertebral disc degeneration, lumbar region: Secondary | ICD-10-CM | POA: Diagnosis not present

## 2021-02-26 DIAGNOSIS — M159 Polyosteoarthritis, unspecified: Secondary | ICD-10-CM | POA: Diagnosis not present

## 2021-02-26 DIAGNOSIS — M5136 Other intervertebral disc degeneration, lumbar region: Secondary | ICD-10-CM | POA: Diagnosis not present

## 2021-03-06 ENCOUNTER — Other Ambulatory Visit: Payer: Self-pay | Admitting: Nurse Practitioner

## 2021-03-06 ENCOUNTER — Other Ambulatory Visit: Payer: Self-pay | Admitting: Internal Medicine

## 2021-03-06 ENCOUNTER — Other Ambulatory Visit: Payer: Self-pay

## 2021-03-06 ENCOUNTER — Ambulatory Visit: Payer: Medicare Other | Admitting: Physician Assistant

## 2021-03-06 DIAGNOSIS — K219 Gastro-esophageal reflux disease without esophagitis: Secondary | ICD-10-CM

## 2021-03-11 DIAGNOSIS — M48062 Spinal stenosis, lumbar region with neurogenic claudication: Secondary | ICD-10-CM | POA: Diagnosis not present

## 2021-03-11 DIAGNOSIS — M5416 Radiculopathy, lumbar region: Secondary | ICD-10-CM | POA: Diagnosis not present

## 2021-03-19 ENCOUNTER — Encounter: Payer: Self-pay | Admitting: Podiatry

## 2021-03-19 ENCOUNTER — Ambulatory Visit (INDEPENDENT_AMBULATORY_CARE_PROVIDER_SITE_OTHER): Payer: Medicare Other | Admitting: Podiatry

## 2021-03-19 ENCOUNTER — Other Ambulatory Visit: Payer: Self-pay

## 2021-03-19 DIAGNOSIS — M7752 Other enthesopathy of left foot: Secondary | ICD-10-CM | POA: Diagnosis not present

## 2021-03-19 DIAGNOSIS — I2584 Coronary atherosclerosis due to calcified coronary lesion: Secondary | ICD-10-CM

## 2021-03-19 DIAGNOSIS — M7751 Other enthesopathy of right foot: Secondary | ICD-10-CM

## 2021-03-19 DIAGNOSIS — I2510 Atherosclerotic heart disease of native coronary artery without angina pectoris: Secondary | ICD-10-CM

## 2021-03-19 MED ORDER — TRIAMCINOLONE ACETONIDE 40 MG/ML IJ SUSP
20.0000 mg | Freq: Once | INTRAMUSCULAR | Status: AC
  Administered 2021-03-19: 20 mg

## 2021-03-19 MED ORDER — DEXAMETHASONE SODIUM PHOSPHATE 120 MG/30ML IJ SOLN
2.0000 mg | Freq: Once | INTRAMUSCULAR | Status: AC
  Administered 2021-03-19: 2 mg via INTRA_ARTICULAR

## 2021-03-19 NOTE — Progress Notes (Signed)
She presents today for follow-up of her peroneal tendinitis states that the injection that she received here recently did not work and that she would like to talk to me about her MRI and her surgical options.  Objective: Vital signs are stable alert oriented x3 pulses are palpable.  She has severe osteoarthritic changes of the subtalar joint and the talonavicular joint.  Ankle joint appears to be relatively clean.  Reviewed her CT scan today which does demonstrate significant osteoarthritic changes of the midfoot and rear foot.  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  This is on the right foot.  Left foot does demonstrate some tenderness on the plantar lateral aspect of the fifth metatarsal base.  Plan: Discussed etiology pathology conservative surgical therapies she think she would like to have surgery with Dr. Amalia Hailey and I recommended him again today to her.  I also reinjected the subtalar joint today right foot the medial gutter of the right ankle and then the bursa beneath the fifth metatarsal base left foot.  She tolerated these procedures well a total of 20 mg of Kenalog was utilized amongst the 3 injection sites.  She will follow-up with Dr. Amalia Hailey for surgical consult.  I think at the very least even though she is recently been cleared by her cardiologist we would need to obtain and CBC, vitamin D and calcium levels.

## 2021-03-20 ENCOUNTER — Encounter: Payer: Self-pay | Admitting: Physician Assistant

## 2021-03-20 ENCOUNTER — Ambulatory Visit (INDEPENDENT_AMBULATORY_CARE_PROVIDER_SITE_OTHER): Payer: Medicare Other | Admitting: Physician Assistant

## 2021-03-20 DIAGNOSIS — K5900 Constipation, unspecified: Secondary | ICD-10-CM

## 2021-03-20 DIAGNOSIS — Z1211 Encounter for screening for malignant neoplasm of colon: Secondary | ICD-10-CM

## 2021-03-20 DIAGNOSIS — F331 Major depressive disorder, recurrent, moderate: Secondary | ICD-10-CM

## 2021-03-20 DIAGNOSIS — M159 Polyosteoarthritis, unspecified: Secondary | ICD-10-CM

## 2021-03-20 DIAGNOSIS — Z1212 Encounter for screening for malignant neoplasm of rectum: Secondary | ICD-10-CM | POA: Diagnosis not present

## 2021-03-20 DIAGNOSIS — M150 Primary generalized (osteo)arthritis: Secondary | ICD-10-CM

## 2021-03-20 DIAGNOSIS — I10 Essential (primary) hypertension: Secondary | ICD-10-CM

## 2021-03-20 DIAGNOSIS — M8949 Other hypertrophic osteoarthropathy, multiple sites: Secondary | ICD-10-CM | POA: Diagnosis not present

## 2021-03-20 DIAGNOSIS — M51369 Other intervertebral disc degeneration, lumbar region without mention of lumbar back pain or lower extremity pain: Secondary | ICD-10-CM

## 2021-03-20 DIAGNOSIS — M5136 Other intervertebral disc degeneration, lumbar region: Secondary | ICD-10-CM | POA: Diagnosis not present

## 2021-03-20 NOTE — Progress Notes (Signed)
[facility] [place], [place] [address]  Internal MEDICINE  Office Visit Note  Patient Name: Vanessa Evans  [id number]  [id number]  Date of Service: 03/25/2021  Chief Complaint  Patient presents with   Follow-up    Constipation, hair loss    Depression   Diabetes    Pre diabetic, will see endo next month   Hyperlipidemia   Hypertension   Gastroesophageal Reflux   Anxiety   Asthma    HPI Pt is here for routine follow up -Has had some recent health changes including worsening right ankle that needs surgery. Also has a cyst in spine and was told she needs spinal surgery to remove cyst. Has had mutlple epidurals and injections. -has been having more constipation lately and  had been told to do 3-5 year repeat colonoscopy. Patient is interested in seeing GI due to constipation worsening and for considering repeat colonoscopy. -takes 3-4 stool softeners per day and sometimes takes laxative as well and has been having difficulty with BM still. BM every few days with medications. -Linzess samples given today and will add metamucil. 2 boxes of 58mg samples and 290 sample given.  -Sees endo for thyoid nodules and A1c monitoring for prediabetes -Receiving injections for macular degeneration and did look up to see it can cause hair loss, but also had covid and has been having hair loss for a few months since. States her endocrinologist will recheck her thryoid levels as well and is trying OTC hair growth product in the meantime  Current Medication: Outpatient Encounter Medications as of 03/20/2021  Medication Sig   acetaminophen (TYLENOL) 500 MG tablet Take 1,000 mg by mouth 2 (two) times daily as needed for moderate pain.   albuterol (PROVENTIL) (2.5 MG/3ML) 0.083% nebulizer solution Take 3 mLs (2.5 mg total) by nebulization every 6 (six) hours as needed for wheezing or shortness of breath.   albuterol (VENTOLIN HFA) 108 (90 Base) MCG/ACT inhaler Inhale 2 puffs  into the lungs every 4 (four) hours as needed for wheezing or shortness of breath.   amoxicillin-clavulanate (AUGMENTIN) 875-125 MG tablet Take 1 tablet by mouth 2 (two) times daily.   aspirin EC 81 MG tablet Take 81 mg by mouth at bedtime.    benzonatate (TESSALON) 100 MG capsule Take 2 capsules (200 mg total) by mouth 2 (two) times daily as needed for cough.   BLACK ELDERBERRY PO Take 1 Dose by mouth 2 (two) times a day.   Calcium Carbonate-Vitamin D 600-400 MG-UNIT tablet Take 1 tablet by mouth 2 (two) times daily.   celecoxib (CELEBREX) 100 MG capsule Take 100 mg by mouth 2 (two) times daily.   cholecalciferol (VITAMIN D) 1000 UNITS tablet Take 1,000 Units by mouth at bedtime.    citalopram (CELEXA) 10 MG tablet Take 1 tablet (10 mg total) by mouth daily.   cyanocobalamin 500 MCG tablet Take 500 mcg by mouth daily.   Dextran 70-Hypromellose (ARTIFICIAL TEARS) 0.1-0.3 % SOLN Apply 1-2 drops to eye daily.   diclofenac Sodium (VOLTAREN) 1 % GEL Apply 4 g topically 4 (four) times daily.   diltiazem (CARDIZEM CD) 120 MG 24 hr capsule Take 120 mg by mouth at bedtime.    diphenhydrAMINE (BENADRYL) 25 MG tablet Take 25-50 mg by mouth daily as needed for itching.   docusate sodium (COLACE) 100 MG capsule Take 200 mg by mouth at bedtime as needed for moderate constipation.    EPINEPHrine (EPIPEN 2-PAK) 0.3 mg/0.3 mL IJ SOAJ injection use  as directed for severe allergy reaction   fenofibrate 160 MG tablet TAKE 1 TABLET DAILY FOR CHOLESTEROL   fluticasone (FLONASE) 50 MCG/ACT nasal spray Use 2 sprays in each nostril daily,   fluticasone (FLONASE) 50 MCG/ACT nasal spray Place into the nose.   folic acid (FOLVITE) A999333 MCG tablet Take 400 mcg by mouth daily.   guaiFENesin (MUCINEX) 600 MG 12 hr tablet Take 600 mg by mouth at bedtime as needed (congestion).   Hypromellose (ARTIFICIAL TEARS OP) Place 1-2 drops into both eyes daily as needed (dry eyes).   ketotifen (ZADITOR) 0.025 % ophthalmic solution Place  1 drop into both eyes 2 (two) times daily as needed (allergies).   levocetirizine (XYZAL) 5 MG tablet Take 1 tablet (5 mg total) by mouth every evening.   levothyroxine (SYNTHROID, LEVOTHROID) 50 MCG tablet Take 50 mcg by mouth daily before breakfast. Brand Name only   Magnesium 250 MG TABS Take 1 tablet by mouth daily.   Melatonin 5 MG TABS Take 1 tablet by mouth at bedtime. For sleep   Multiple Vitamins-Minerals (MULTIVITAMIN WITH MINERALS) tablet Take 1 tablet by mouth daily.   OXYGEN Inhale into the lungs. 2 litre at night   pantoprazole (PROTONIX) 40 MG tablet Take 1 tablet (40 mg total) by mouth 2 (two) times daily.   pyridoxine (B-6) 100 MG tablet Take 100 mg by mouth daily.   sodium chloride (OCEAN) 0.65 % SOLN nasal spray Place 1 spray into both nostrils 2 (two) times daily.   sucralfate (CARAFATE) 1 g tablet Take 3 times daily with meals.   Vitamin A 7.5 MG (25000 UT) CAPS Take 1 tablet by mouth 2 (two) times daily.   zinc gluconate 50 MG tablet Take 50 mg by mouth daily.   [DISCONTINUED] diazepam (VALIUM) 5 MG tablet Take 1 tablet po one time two hours prior to procedure. May repeat dose prior to leaving home for procedure if symptoms are persistent. Patient must have driver to and from procedure (Patient not taking: Reported on 03/20/2021)   [DISCONTINUED] diltiazem (TIAZAC) 120 MG 24 hr capsule Take 1 capsule by mouth daily. (Patient not taking: Reported on 03/20/2021)   [DISCONTINUED] diphenhydrAMINE (SOMINEX) 25 MG tablet Take by mouth. (Patient not taking: Reported on 03/20/2021)   [DISCONTINUED] nystatin cream (MYCOSTATIN) Apply 1 application topically 2 (two) times daily. (Patient not taking: Reported on 03/20/2021)   [DISCONTINUED] phenazopyridine (PYRIDIUM) 200 MG tablet Take one tab po bid prn for bladder spasm (Patient not taking: Reported on 03/20/2021)   [DISCONTINUED] pregabalin (LYRICA) 25 MG capsule Take by mouth. (Patient not taking: Reported on 03/20/2021)    Facility-Administered Encounter Medications as of 03/20/2021  Medication   betamethasone acetate-betamethasone sodium phosphate (CELESTONE) injection 3 mg    Surgical History: Past Surgical History:  Procedure Laterality Date   BACK SURGERY  2014   removed bone to get to a benign tumor that was pushing on spinal column   BIOPSY THYROID  2018   bladder biopsies  2003   9 biopsies done by dr. cope.  all benign.  inflammatory process going on in bladder   BREAST BIOPSY Right    benign   BREAST SURGERY Right    lumpectomy   CATARACT EXTRACTION W/PHACO Left 12/25/2014   Procedure: CATARACT EXTRACTION PHACO AND INTRAOCULAR LENS PLACEMENT ([place]);  Surgeon: Birder Robson, MD;  Location: [facility] ORS;  Service: Ophthalmology;  Laterality: Left;  [place] 00:32 AP% 20.0 CDE 6.49   COLONOSCOPY W/ BIOPSIES     COLONOSCOPY W/  POLYPECTOMY  2002, 2004, 2005   adenomatous polyps removed   COLONOSCOPY WITH PROPOFOL N/A 08/23/2017   Procedure: COLONOSCOPY WITH PROPOFOL;  Surgeon: Manya Silvas, MD;  Location: [facility] ENDOSCOPY;  Service: Endoscopy;  Laterality: N/A;   COLONOSCOPY WITH PROPOFOL N/A 11/08/2017   Procedure: COLONOSCOPY WITH PROPOFOL;  Surgeon: Manya Silvas, MD;  Location: [facility] ENDOSCOPY;  Service: Endoscopy;  Laterality: N/A;   CYSTOCELE REPAIR N/A 04/09/2016   Procedure: ANTERIOR REPAIR (CYSTOCELE);  Surgeon: Gae Dry, MD;  Location: [facility] ORS;  Service: Gynecology;  Laterality: N/A;   DIAGNOSTIC LAPAROSCOPY  2008   removed both ovaries with cysts, tubes and fibroids   DILATION AND CURETTAGE OF UTERUS  1973   ESOPHAGOGASTRODUODENOSCOPY (EGD) WITH PROPOFOL N/A 08/23/2017   Procedure: ESOPHAGOGASTRODUODENOSCOPY (EGD) WITH PROPOFOL;  Surgeon: Manya Silvas, MD;  Location: [facility]COPY;  Service: Endoscopy;  Laterality: N/A;   EYE SURGERY Bilateral 2016   HERNIA REPAIR Right 2008   Inguinal hernia Repair, ventral hernia repair   JOINT REPLACEMENT Right 2008   knee   KNEE  ARTHROSCOPY Right    LUMBAR LAMINECTOMY/DECOMPRESSION MICRODISCECTOMY Right 03/21/2013   Procedure: Right Lumbar five-Sacral one Laminectomy for Synovial Cyst;  Surgeon: Faythe Ghee, MD;  Location: [facility] NEURO ORS;  Service: Neurosurgery;  Laterality: Right;  right   OOPHORECTOMY     REVERSE SHOULDER ARTHROPLASTY Right 02/22/2018   Procedure: REVERSE SHOULDER ARTHROPLASTY;  Surgeon: Corky Mull, MD;  Location: [facility] ORS;  Service: Orthopedics;  Laterality: Right;   TUBAL LIGATION     UNILATERAL SALPINGECTOMY Left 04/09/2016   Procedure: UNILATERAL SALPINGECTOMY;  Surgeon: Gae Dry, MD;  Location: [facility] ORS;  Service: Gynecology;  Laterality: Left;   UPPER GI ENDOSCOPY  2010   with biopsy of gastric erosion   VAGINAL HYSTERECTOMY N/A 04/09/2016   Procedure: HYSTERECTOMY VAGINAL;  Surgeon: Gae Dry, MD;  Location: [facility] ORS;  Service: Gynecology;  Laterality: N/A;   VAGINAL HYSTERECTOMY  2017   and bladder tack    Medical History: Past Medical History:  Diagnosis Date   Arthritis    osteo   Asthma    needs rescue inhaler few times a year   Claustrophobia 04/21/2020   Depression    Dizziness    light headed spells but it has been a while   Dysrhythmia    tachycardia.(cardizem). brady during procedures   Fibromyalgia    GERD (gastroesophageal reflux disease)    gastritis   Headache(784.0)    Heart murmur 2019   aortic. dr. Nehemiah Massed not worried about this   Hyperlipidemia    Hypertension    Hypothyroidism    Macular degeneration    Migraine    Orthopnea    Pneumonia    long time ago (greater than 5 years ago)   PONV (postoperative nausea and vomiting)    very anxious during cataract surgery. brady during colonoscopy   Shortness of breath    any exertion, cannot lie flat    Family History: Family History  Problem Relation Age of Onset   Pulmonary fibrosis Mother    Melanoma Father    Cardiomyopathy Sister        and arrhythmia   Breast cancer Cousin         paternal 1st    Social History   Socioeconomic History   Marital status: Married    Spouse name: Not on file   Number of children: Not on file   Years of education: Not on file  Highest education level: Not on file  Occupational History   Not on file  Tobacco Use   Smoking status: Former    Types: Cigarettes    Quit date: 32    Years since quitting: 55.6   Smokeless tobacco: Never  Vaping Use   Vaping Use: Never used  Substance and Sexual Activity   Alcohol use: Not Currently   Drug use: No   Sexual activity: Not Currently  Other Topics Concern   Not on file  Social History Narrative   Not on file   Social Determinants of Health   Financial Resource Strain: Not on file  Food Insecurity: Not on file  Transportation Needs: Not on file  Physical Activity: Not on file  Stress: Not on file  Social Connections: Not on file  Intimate Partner Violence: Not on file      Review of Systems  Constitutional:  Negative for chills, fatigue and unexpected weight change.  HENT:  Positive for postnasal drip. Negative for congestion, rhinorrhea, sneezing and sore throat.   Eyes:  Negative for redness.  Respiratory:  Negative for cough, chest tightness, shortness of breath and wheezing.   Cardiovascular:  Negative for chest pain and palpitations.  Gastrointestinal:  Positive for constipation. Negative for abdominal pain, blood in stool, diarrhea, nausea and vomiting.  Endocrine:       Hair loss  Genitourinary:  Negative for dysuria and frequency.  Musculoskeletal:  Positive for arthralgias and back pain. Negative for joint swelling and neck pain.  Skin:  Negative for rash.  Neurological: Negative.  Negative for tremors and numbness.  Hematological:  Negative for adenopathy. Does not bruise/bleed easily.  Psychiatric/Behavioral:  Negative for behavioral problems (Depression), sleep disturbance and suicidal ideas. The patient is not nervous/anxious.    Vital Signs: BP 140/60    Pulse 95   Temp 97.7 F (36.5 C)   Resp 16   Ht 5' (1.524 m)   Wt 150 lb 12.8 oz (68.4 kg)   SpO2 97%   BMI 29.45 kg/m    Physical Exam Vitals and nursing note reviewed.  Constitutional:      General: She is not in acute distress.    Appearance: She is well-developed. She is not diaphoretic.  HENT:     Head: Normocephalic and atraumatic.     Mouth/Throat:     Pharynx: No oropharyngeal exudate.  Eyes:     Pupils: Pupils are equal, round, and reactive to light.  Neck:     Thyroid: No thyromegaly.     Vascular: No JVD.     Trachea: No tracheal deviation.  Cardiovascular:     Rate and Rhythm: Normal rate and regular rhythm.     Heart sounds: Normal heart sounds. No murmur heard.   No friction rub. No gallop.  Pulmonary:     Effort: Pulmonary effort is normal. No respiratory distress.     Breath sounds: No wheezing or rales.  Chest:     Chest wall: No tenderness.  Abdominal:     General: Bowel sounds are normal.     Palpations: Abdomen is soft.     Tenderness: There is no abdominal tenderness.  Musculoskeletal:        General: Normal range of motion.     Cervical back: Normal range of motion and neck supple.  Lymphadenopathy:     Cervical: No cervical adenopathy.  Skin:    General: Skin is warm and dry.  Neurological:     Mental Status: She is  alert and oriented to person, place, and time.     Cranial Nerves: No cranial nerve deficit.  Psychiatric:        Behavior: Behavior normal.        Thought Content: Thought content normal.        Judgment: Judgment normal.       Assessment/Plan: 1. Benign essential HTN Stable, continue current medications  2. Constipation, unspecified constipation type Will try sample of linzess and call if script desired. Will also place referral to GI since this has been ongoing despite several OTC medications. Patient also reports a hx of colonic polyps that are routinely monitored with colonoscopy and thinks it may be time to  repeat this as well. Also advised to increase fiber intake and try metamucil. - Ambulatory referral to Gastroenterology  3. Screening for colorectal cancer - Ambulatory referral to Gastroenterology  4. Moderate episode of recurrent major depressive disorder (HCC) Continue Citalopram  5. Primary osteoarthritis involving multiple joints Followed by rheumatology  6. DDD (degenerative disc disease), lumbar Followed by PMR   General Counseling: Kendalyn verbalizes understanding of the findings of todays visit and agrees with plan of treatment. I have discussed any further diagnostic evaluation that may be needed or ordered today. We also reviewed her medications today. she has been encouraged to call the office with any questions or concerns that should arise related to todays visit.    Orders Placed This Encounter  Procedures   Ambulatory referral to Gastroenterology    No orders of the defined types were placed in this encounter.   This patient was seen by Drema Dallas, PA-C in collaboration with Dr. Clayborn Bigness as a part of collaborative care agreement.   Total time spent:35 Minutes Time spent includes review of chart, medications, test results, and follow up plan with the patient.      Dr Lavera Guise Internal medicine

## 2021-03-21 ENCOUNTER — Telehealth: Payer: Self-pay

## 2021-03-21 NOTE — Telephone Encounter (Signed)
Pt called [facility] stating that Lauren gave here Linzess and it made her have severe Diarrhea this morning and wanted to know what to do.  Per Ander Purpura pt needs to stop the Linzess and any stool softeners, try a bland diet and drink plenty of fluids in small sips of water and pedialyte or gatorade.  Pt advised it has stopped for now this evening.  I informed pt that we will get back with her on Monday and see if Lauren wants to have her continue the linzess or try something else.  Pt advised to call back if any more problems.

## 2021-04-01 DIAGNOSIS — M5416 Radiculopathy, lumbar region: Secondary | ICD-10-CM | POA: Diagnosis not present

## 2021-04-01 DIAGNOSIS — M48062 Spinal stenosis, lumbar region with neurogenic claudication: Secondary | ICD-10-CM | POA: Diagnosis not present

## 2021-04-01 DIAGNOSIS — M5136 Other intervertebral disc degeneration, lumbar region: Secondary | ICD-10-CM | POA: Diagnosis not present

## 2021-04-07 DIAGNOSIS — H353211 Exudative age-related macular degeneration, right eye, with active choroidal neovascularization: Secondary | ICD-10-CM | POA: Diagnosis not present

## 2021-04-08 ENCOUNTER — Emergency Department: Payer: Medicare Other

## 2021-04-08 ENCOUNTER — Other Ambulatory Visit: Payer: Self-pay

## 2021-04-08 ENCOUNTER — Emergency Department
Admission: EM | Admit: 2021-04-08 | Discharge: 2021-04-08 | Disposition: A | Discharge status: Home / Self Care | Payer: Medicare Other | Attending: Emergency Medicine | Admitting: Emergency Medicine

## 2021-04-08 DIAGNOSIS — Z87891 Personal history of nicotine dependence: Secondary | ICD-10-CM | POA: Diagnosis not present

## 2021-04-08 DIAGNOSIS — Z7982 Long term (current) use of aspirin: Secondary | ICD-10-CM | POA: Insufficient documentation

## 2021-04-08 DIAGNOSIS — J4520 Mild intermittent asthma, uncomplicated: Secondary | ICD-10-CM | POA: Insufficient documentation

## 2021-04-08 DIAGNOSIS — Z86018 Personal history of other benign neoplasm: Secondary | ICD-10-CM | POA: Insufficient documentation

## 2021-04-08 DIAGNOSIS — E039 Hypothyroidism, unspecified: Secondary | ICD-10-CM | POA: Insufficient documentation

## 2021-04-08 DIAGNOSIS — M952 Other acquired deformity of head: Secondary | ICD-10-CM | POA: Diagnosis not present

## 2021-04-08 DIAGNOSIS — R519 Headache, unspecified: Secondary | ICD-10-CM | POA: Diagnosis not present

## 2021-04-08 DIAGNOSIS — R0602 Shortness of breath: Secondary | ICD-10-CM | POA: Diagnosis not present

## 2021-04-08 DIAGNOSIS — Z96651 Presence of right artificial knee joint: Secondary | ICD-10-CM | POA: Diagnosis not present

## 2021-04-08 DIAGNOSIS — R42 Dizziness and giddiness: Secondary | ICD-10-CM

## 2021-04-08 DIAGNOSIS — Z96611 Presence of right artificial shoulder joint: Secondary | ICD-10-CM | POA: Diagnosis not present

## 2021-04-08 DIAGNOSIS — H5711 Ocular pain, right eye: Secondary | ICD-10-CM | POA: Diagnosis not present

## 2021-04-08 DIAGNOSIS — G319 Degenerative disease of nervous system, unspecified: Secondary | ICD-10-CM | POA: Diagnosis not present

## 2021-04-08 DIAGNOSIS — R4182 Altered mental status, unspecified: Secondary | ICD-10-CM | POA: Diagnosis not present

## 2021-04-08 DIAGNOSIS — Z20822 Contact with and (suspected) exposure to covid-19: Secondary | ICD-10-CM | POA: Insufficient documentation

## 2021-04-08 DIAGNOSIS — Z79899 Other long term (current) drug therapy: Secondary | ICD-10-CM | POA: Diagnosis not present

## 2021-04-08 DIAGNOSIS — H538 Other visual disturbances: Secondary | ICD-10-CM | POA: Diagnosis not present

## 2021-04-08 DIAGNOSIS — I25119 Atherosclerotic heart disease of native coronary artery with unspecified angina pectoris: Secondary | ICD-10-CM | POA: Insufficient documentation

## 2021-04-08 DIAGNOSIS — I10 Essential (primary) hypertension: Secondary | ICD-10-CM | POA: Insufficient documentation

## 2021-04-08 LAB — CBC
HCT: 41.9 % (ref 36.0–46.0)
Hemoglobin: 13.7 g/dL (ref 12.0–15.0)
MCH: 29.3 pg (ref 26.0–34.0)
MCHC: 32.7 g/dL (ref 30.0–36.0)
MCV: 89.7 fL (ref 80.0–100.0)
Platelets: 301 10*3/uL (ref 150–400)
RBC: 4.67 MIL/uL (ref 3.87–5.11)
RDW: 13.4 % (ref 11.5–15.5)
WBC: 8.9 10*3/uL (ref 4.0–10.5)
nRBC: 0 % (ref 0.0–0.2)

## 2021-04-08 LAB — BASIC METABOLIC PANEL
Anion gap: 9 (ref 5–15)
BUN: 25 mg/dL — ABNORMAL HIGH (ref 8–23)
CO2: 29 mmol/L (ref 22–32)
Calcium: 10 mg/dL (ref 8.9–10.3)
Chloride: 99 mmol/L (ref 98–111)
Creatinine, Ser: 0.7 mg/dL (ref 0.44–1.00)
GFR, Estimated: >60 mL/min (ref >60)
Glucose, Bld: 96 mg/dL (ref 70–99)
Potassium: 4.9 mmol/L (ref 3.5–5.1)
Sodium: 137 mmol/L (ref 135–145)

## 2021-04-08 LAB — TROPONIN I (HIGH SENSITIVITY)
Troponin I (High Sensitivity): 5 ng/L (ref <18)
Troponin I (High Sensitivity): 5 ng/L (ref <18)

## 2021-04-08 LAB — RESP PANEL BY RT-PCR (FLU A&B, COVID) ARPGX2
Influenza A by PCR: NEGATIVE
Influenza B by PCR: NEGATIVE
SARS Coronavirus 2 by RT PCR: NEGATIVE

## 2021-04-08 NOTE — ED Notes (Signed)
Patient transported to CT 

## 2021-04-08 NOTE — ED Triage Notes (Signed)
Pt to ED for HTN since yesterday and irregular HR.  Denies cp. +shob.  Reports pain to right eye and altered vision- had injection to right eye yesterday.   Takes cardizem

## 2021-04-08 NOTE — Discharge Instructions (Addendum)
Please seek medical attention for any high fevers, chest pain, shortness of breath, change in behavior, persistent vomiting, bloody stool or any other new or concerning symptoms.  

## 2021-04-08 NOTE — ED Notes (Signed)
Lab called to draw troponin.

## 2021-04-08 NOTE — ED Provider Notes (Signed)
[facility] Emergency Department Provider Note  ____________________________________________   I have reviewed the triage vital signs and the nursing notes.   HISTORY  Chief Complaint Hypertension   History limited by: Not Limited   HPI Vanessa Evans is a 75 y.o. female who presents to the emergency department today from urgent care because of concern for high blood pressure, shortness of breath, change in heart rate and lightheadedness.  The patient states that her symptoms started yesterday.  The patient did undergo a right eye injection yesterday.  She states she has had this in the past.  Then the last night noticed that her's heart seemed to be skipping and changing its frequency of beating.  She also felt some lightheadedness.  Patient did develop pain behind her right eye.  She also states that she checked her blood pressure multiple times and found it to be elevated.  The patient did go to her retina doctor today who evaluated her eye.  She then went to urgent care who sent her to the emergency department.   Records reviewed. Per medical record review patient has a history of asthma.   Past Medical History:  Diagnosis Date   Arthritis    osteo   Asthma    needs rescue inhaler few times a year   Claustrophobia 04/21/2020   Depression    Dizziness    light headed spells but it has been a while   Dysrhythmia    tachycardia.(cardizem). brady during procedures   Fibromyalgia    GERD (gastroesophageal reflux disease)    gastritis   Headache(784.0)    Heart murmur 2019   aortic. dr. Nehemiah Massed not worried about this   Hyperlipidemia    Hypertension    Hypothyroidism    Macular degeneration    Migraine    Orthopnea    Pneumonia    long time ago (greater than 5 years ago)   PONV (postoperative nausea and vomiting)    very anxious during cataract surgery. brady during colonoscopy   Shortness of breath    any exertion, cannot lie flat     Patient Active Problem List   Diagnosis Date Noted   Coronary artery disease involving native coronary artery of native heart with angina pectoris ([place]) 10/28/2020   Peptic ulcer disease 04/21/2020   Claustrophobia 04/21/2020   Urinary tract infection without hematuria 02/04/2020   Pain in both lower extremities 12/18/2019   Inflammatory polyarthritis ([place]) 07/26/2019   Excessive daytime sleepiness 07/26/2019   Encounter for screening mammogram for malignant neoplasm of breast 07/26/2019   Intercostal muscle pain 06/25/2019   Positive ANA (antinuclear antibody) 05/01/2019   Primary osteoarthritis involving multiple joints 04/26/2019   Mild intermittent asthma without complication 0000000   Seasonal allergic rhinitis due to pollen 08/22/2018   Chills with fever 08/11/2018   Sore throat 08/11/2018   Acute upper respiratory infection 08/11/2018   Candidiasis 08/11/2018   Iron deficiency anemia 05/05/2018   Right hip pain 04/27/2018   Accidental fall from furniture 04/27/2018   Vitamin D deficiency 04/27/2018   Cough 04/18/2018   Dysuria 04/18/2018   Status post reverse total shoulder replacement, right 02/22/2018   Abnormal ECG 02/18/2018   Pain in joint of right shoulder 02/13/2018   Abdominal aortic atherosclerosis ([place]) 02/13/2018   Episode of recurrent major depressive disorder ([place]) 02/13/2018   Encounter for general adult medical examination with abnormal findings 02/13/2018   Lipoma of right shoulder 01/31/2018   Rotator cuff tendinitis, right 01/31/2018  Chronic superficial gastritis without bleeding 12/12/2017   Schatzki's ring 12/12/2017   Acute gastritis 11/08/2017   Nausea 10/18/2017   Other fatigue 10/18/2017   Allergic rhinitis 09/20/2017   Eczema 09/20/2017   Hematuria 09/20/2017   Mixed hyperlipidemia 09/20/2017   Osteoarthritis 09/20/2017   Osteoporosis, post-menopausal 09/20/2017   Palpitations 07/05/2017   Epigastric pain 05/24/2017    Gastroesophageal reflux disease without esophagitis 05/24/2017   Impingement syndrome of left shoulder 10/02/2016   Trochanteric bursitis of left hip 10/02/2016   SOB (shortness of breath) 04/16/2016   Uterine prolapse 04/09/2016   Cystocele 04/09/2016   Hyponatremia 04/09/2016   Benign essential HTN 11/18/2015   Paroxysmal supraventricular tachycardia ([place]) 11/18/2015   Premature ventricular contraction 07/20/2014   History of colonic polyps 05/29/2014   Asthma 02/05/2014   Chest pain 02/05/2014   Increased frequency of urination 02/05/2014   Tachycardia 02/05/2014   Female stress incontinence 12/14/2013   Gross hematuria 12/14/2013   Incomplete emptying of bladder 12/14/2013   Other chronic cystitis without hematuria 12/14/2013    Past Surgical History:  Procedure Laterality Date   BACK SURGERY  2014   removed bone to get to a benign tumor that was pushing on spinal column   BIOPSY THYROID  2018   bladder biopsies  2003   9 biopsies done by dr. cope.  all benign.  inflammatory process going on in bladder   BREAST BIOPSY Right    benign   BREAST SURGERY Right    lumpectomy   CATARACT EXTRACTION W/PHACO Left 12/25/2014   Procedure: CATARACT EXTRACTION PHACO AND INTRAOCULAR LENS PLACEMENT ([place]);  Surgeon: Birder Robson, MD;  Location: [facility] ORS;  Service: Ophthalmology;  Laterality: Left;  [place] 00:32 AP% 20.0 CDE 6.49   COLONOSCOPY W/ BIOPSIES     COLONOSCOPY W/ POLYPECTOMY  2002, 2004, 2005   adenomatous polyps removed   COLONOSCOPY WITH PROPOFOL N/A 08/23/2017   Procedure: COLONOSCOPY WITH PROPOFOL;  Surgeon: Manya Silvas, MD;  Location: [facility] ENDOSCOPY;  Service: Endoscopy;  Laterality: N/A;   COLONOSCOPY WITH PROPOFOL N/A 11/08/2017   Procedure: COLONOSCOPY WITH PROPOFOL;  Surgeon: Manya Silvas, MD;  Location: [facility] ENDOSCOPY;  Service: Endoscopy;  Laterality: N/A;   CYSTOCELE REPAIR N/A 04/09/2016   Procedure: ANTERIOR REPAIR (CYSTOCELE);  Surgeon: Gae Dry,  MD;  Location: [facility] ORS;  Service: Gynecology;  Laterality: N/A;   DIAGNOSTIC LAPAROSCOPY  2008   removed both ovaries with cysts, tubes and fibroids   DILATION AND CURETTAGE OF UTERUS  1973   ESOPHAGOGASTRODUODENOSCOPY (EGD) WITH PROPOFOL N/A 08/23/2017   Procedure: ESOPHAGOGASTRODUODENOSCOPY (EGD) WITH PROPOFOL;  Surgeon: Manya Silvas, MD;  Location: [facility] ENDOSCOPY;  Service: Endoscopy;  Laterality: N/A;   EYE SURGERY Bilateral 2016   HERNIA REPAIR Right 2008   Inguinal hernia Repair, ventral hernia repair   JOINT REPLACEMENT Right 2008   knee   KNEE ARTHROSCOPY Right    LUMBAR LAMINECTOMY/DECOMPRESSION MICRODISCECTOMY Right 03/21/2013   Procedure: Right Lumbar five-Sacral one Laminectomy for Synovial Cyst;  Surgeon: Faythe Ghee, MD;  Location: [facility] NEURO ORS;  Service: Neurosurgery;  Laterality: Right;  right   OOPHORECTOMY     REVERSE SHOULDER ARTHROPLASTY Right 02/22/2018   Procedure: REVERSE SHOULDER ARTHROPLASTY;  Surgeon: Corky Mull, MD;  Location: [facility] ORS;  Service: Orthopedics;  Laterality: Right;   TUBAL LIGATION     UNILATERAL SALPINGECTOMY Left 04/09/2016   Procedure: UNILATERAL SALPINGECTOMY;  Surgeon: Gae Dry, MD;  Location: [facility] ORS;  Service: Gynecology;  Laterality:  Left;   UPPER GI ENDOSCOPY  2010   with biopsy of gastric erosion   VAGINAL HYSTERECTOMY N/A 04/09/2016   Procedure: HYSTERECTOMY VAGINAL;  Surgeon: Gae Dry, MD;  Location: [facility] ORS;  Service: Gynecology;  Laterality: N/A;   VAGINAL HYSTERECTOMY  2017   and bladder tack    Prior to Admission medications   Medication Sig Start Date End Date Taking? Authorizing Provider  acetaminophen (TYLENOL) 500 MG tablet Take 1,000 mg by mouth 2 (two) times daily as needed for moderate pain.    [provider]  albuterol (PROVENTIL) (2.5 MG/3ML) 0.083% nebulizer solution Take 3 mLs (2.5 mg total) by nebulization every 6 (six) hours as needed for wheezing or shortness of breath. 09/20/20    Lavera Guise, MD  albuterol (VENTOLIN HFA) 108 (90 Base) MCG/ACT inhaler Inhale 2 puffs into the lungs every 4 (four) hours as needed for wheezing or shortness of breath. 12/05/20   McDonough, Si Gaul, PA-C  amoxicillin-clavulanate (AUGMENTIN) 875-125 MG tablet Take 1 tablet by mouth 2 (two) times daily. 01/27/21   McDonough, Si Gaul, PA-C  aspirin EC 81 MG tablet Take 81 mg by mouth at bedtime.     [provider]  benzonatate (TESSALON) 100 MG capsule Take 2 capsules (200 mg total) by mouth 2 (two) times daily as needed for cough. 09/09/20   McDonough, Lauren K, PA-C  BLACK ELDERBERRY PO Take 1 Dose by mouth 2 (two) times a day.    [provider]  Calcium Carbonate-Vitamin D 600-400 MG-UNIT tablet Take 1 tablet by mouth 2 (two) times daily.    [provider]  celecoxib (CELEBREX) 100 MG capsule Take 100 mg by mouth 2 (two) times daily. 02/26/21   [provider]  cholecalciferol (VITAMIN D) 1000 UNITS tablet Take 1,000 Units by mouth at bedtime.     [provider]  citalopram (CELEXA) 10 MG tablet Take 1 tablet (10 mg total) by mouth daily. 09/09/20   McDonough, Si Gaul, PA-C  cyanocobalamin 500 MCG tablet Take 500 mcg by mouth daily.    [provider]  Dextran 70-Hypromellose (ARTIFICIAL TEARS) 0.1-0.3 % SOLN Apply 1-2 drops to eye daily.    [provider]  diclofenac Sodium (VOLTAREN) 1 % GEL Apply 4 g topically 4 (four) times daily. 12/05/20   McDonough, Si Gaul, PA-C  diltiazem (CARDIZEM CD) 120 MG 24 hr capsule Take 120 mg by mouth at bedtime.  05/18/16   [provider]  diphenhydrAMINE (BENADRYL) 25 MG tablet Take 25-50 mg by mouth daily as needed for itching.    [provider]  docusate sodium (COLACE) 100 MG capsule Take 200 mg by mouth at bedtime as needed for moderate constipation.     [provider]  EPINEPHrine (EPIPEN 2-PAK) 0.3 mg/0.3 mL IJ SOAJ injection use as directed for severe allergy  reaction 03/12/20   Allyne Gee, MD  fenofibrate 160 MG tablet TAKE 1 TABLET DAILY FOR CHOLESTEROL 02/06/21   Lavera Guise, MD  fluticasone [facility]) 50 MCG/ACT nasal spray Use 2 sprays in each nostril daily, 12/05/20   McDonough, Lauren K, PA-C  fluticasone (FLONASE) 50 MCG/ACT nasal spray Place into the nose. 12/18/19   [provider]  folic acid (FOLVITE) A999333 MCG tablet Take 400 mcg by mouth daily.    [provider]  guaiFENesin (MUCINEX) 600 MG 12 hr tablet Take 600 mg by mouth at bedtime as needed (congestion).    [provider]  Hypromellose (  ARTIFICIAL TEARS OP) Place 1-2 drops into both eyes daily as needed (dry eyes).    [provider]  ketotifen (ZADITOR) 0.025 % ophthalmic solution Place 1 drop into both eyes 2 (two) times daily as needed (allergies).    [provider]  levocetirizine (XYZAL) 5 MG tablet Take 1 tablet (5 mg total) by mouth every evening. 08/14/20   Lavera Guise, MD  levothyroxine (SYNTHROID, LEVOTHROID) 50 MCG tablet Take 50 mcg by mouth daily before breakfast. Brand Name only    [provider]  Magnesium 250 MG TABS Take 1 tablet by mouth daily.    [provider]  Melatonin 5 MG TABS Take 1 tablet by mouth at bedtime. For sleep    [provider]  Multiple Vitamins-Minerals (MULTIVITAMIN WITH MINERALS) tablet Take 1 tablet by mouth daily.    [provider]  OXYGEN Inhale into the lungs. 2 litre at night    [provider]  pantoprazole (PROTONIX) 40 MG tablet Take 1 tablet (40 mg total) by mouth 2 (two) times daily. 03/06/21   Lavera Guise, MD  pyridoxine (B-6) 100 MG tablet Take 100 mg by mouth daily.    [provider]  sodium chloride (OCEAN) 0.65 % SOLN nasal spray Place 1 spray into both nostrils 2 (two) times daily.    [provider]  sucralfate (CARAFATE) 1 g tablet Take 3 times daily with meals. 09/09/20   McDonough, Si Gaul, PA-C  Vitamin A 7.5 MG  (25000 UT) CAPS Take 1 tablet by mouth 2 (two) times daily.    [provider]  zinc gluconate 50 MG tablet Take 50 mg by mouth daily.    [provider]    Allergies Codeine, Levofloxacin, Oxycodone, Ultram [tramadol], Nitrofurantoin, Nsaids, Sulfacetamide, Adhesive [tape], Azithromycin, Ciprofloxacin, Ketorolac, Sulfa antibiotics, Tolmetin, and Zofran [ondansetron hcl]  Family History  Problem Relation Age of Onset   Pulmonary fibrosis Mother    Melanoma Father    Cardiomyopathy Sister        and arrhythmia   Breast cancer Cousin        paternal 1st    Social History Social History   Tobacco Use   Smoking status: Former    Types: Cigarettes    Quit date: 1967    Years since quitting: 55.6   Smokeless tobacco: Never  Vaping Use   Vaping Use: Never used  Substance Use Topics   Alcohol use: Not Currently   Drug use: No    Review of Systems Constitutional: No fever/chills Eyes: Positive for vision change.  ENT: No sore throat. Cardiovascular: Denies chest pain. Respiratory: Denies shortness of breath. Gastrointestinal: No abdominal pain.  No nausea, no vomiting.  No diarrhea.   Genitourinary: Negative for dysuria. Musculoskeletal: Negative for back pain. Skin: Negative for rash. Neurological: Positive for lightheadedness. Positive for headache.  ____________________________________________   PHYSICAL EXAM:  VITAL SIGNS: ED Triage Vitals  Enc Vitals Group     BP 04/08/21 1325 (!) 152/74     Pulse Rate 04/08/21 1325 92     Resp 04/08/21 1325 20     Temp 04/08/21 1325 98.5 F (36.9 C)     Temp Source 04/08/21 1325 Oral     SpO2 04/08/21 1325 97 %     Weight 04/08/21 1325 150 lb (68 kg)     Height 04/08/21 1325 5' (1.524 m)     Head Circumference --      Peak Flow --  Pain Score 04/08/21 1331 4   Constitutional: Alert and oriented.  Eyes: Conjunctivae are normal.  ENT      Head: Normocephalic and atraumatic.      Nose: No  congestion/rhinnorhea.      Mouth/Throat: Mucous membranes are moist.      Neck: No stridor. Hematological/Lymphatic/Immunilogical: No cervical lymphadenopathy. Cardiovascular: Normal rate, regular rhythm.  No murmurs, rubs, or gallops.  Respiratory: Normal respiratory effort without tachypnea nor retractions. Breath sounds are clear and equal bilaterally. No wheezes/rales/rhonchi. Gastrointestinal: Soft and non tender. No rebound. No guarding.  Genitourinary: Deferred Musculoskeletal: Normal range of motion in all extremities. No lower extremity edema. Neurologic:  Normal speech and language. No gross focal neurologic deficits are appreciated.  Skin:  Skin is warm, dry and intact. No rash noted. Psychiatric: Mood and affect are normal. Speech and behavior are normal. Patient exhibits appropriate insight and judgment.  ____________________________________________    LABS (pertinent positives/negatives)  Trop hs 5 CBC wbc 8.9, hgb 13.7, plt 301 BMP wnl except BUN 25  ____________________________________________   EKG  I, Nance Pear, attending physician, personally viewed and interpreted this EKG  EKG Time: 1333 Rate: 97 Rhythm: normal sinus rhythm Axis: left axis deviation Intervals: qtc 424 QRS: narrow, q waves V1, V2 ST changes: no st elevation Impression: abnormal ekg ____________________________________________    RADIOLOGY  CXR No active cardiopulmonary disease  CT head No acute intracranial abnormality ____________________________________________   PROCEDURES  Procedures  ____________________________________________   INITIAL IMPRESSION / ASSESSMENT AND PLAN / ED COURSE  Pertinent labs & imaging results that were available during my care of the patient were reviewed by me and considered in my medical decision making (see chart for details).   Patient presents to the emergency department today with concerns for high blood pressure as well as some  headache and lightheadedness.  Patient did have an injection to her right yesterday.  Symptoms did start shortly after this.  Patient's work-up here without concerning troponin elevation or electrolyte abnormality.  Did obtain a CT head which did not show any acute abnormality.  Did have a discussion with patient about high blood pressure.  At this time do not think any anticoagulant damage.  Will plan on discharging.  ____________________________________________   FINAL CLINICAL IMPRESSION(S) / ED DIAGNOSES  Final diagnoses:  Hypertension, unspecified type  Bad headache  Lightheadedness     Note: This dictation was prepared with Dragon dictation. Any transcriptional errors that result from this process are unintentional     Nance Pear, MD 04/08/21 [phone]

## 2021-04-09 ENCOUNTER — Telehealth: Payer: Self-pay

## 2021-04-09 NOTE — Telephone Encounter (Signed)
Manually faxed [facility] gastroenterology referral-[place]

## 2021-04-11 ENCOUNTER — Encounter: Payer: Self-pay | Admitting: Nurse Practitioner

## 2021-04-11 ENCOUNTER — Other Ambulatory Visit: Payer: Self-pay

## 2021-04-11 ENCOUNTER — Ambulatory Visit (INDEPENDENT_AMBULATORY_CARE_PROVIDER_SITE_OTHER): Payer: Medicare Other | Admitting: Nurse Practitioner

## 2021-04-11 VITALS — BP 132/78 | HR 85 | Temp 98.8°F | Resp 16 | Ht 60.25 in | Wt 148.8 lb

## 2021-04-11 DIAGNOSIS — R519 Headache, unspecified: Secondary | ICD-10-CM | POA: Diagnosis not present

## 2021-04-11 DIAGNOSIS — R42 Dizziness and giddiness: Secondary | ICD-10-CM | POA: Diagnosis not present

## 2021-04-11 DIAGNOSIS — I10 Essential (primary) hypertension: Secondary | ICD-10-CM | POA: Diagnosis not present

## 2021-04-11 NOTE — Progress Notes (Signed)
[facility] [place], [place] [address]  Internal MEDICINE  Office Visit Note  Patient Name: Vanessa Evans  [id number]  [id number]  Date of Service: 04/11/2021  Chief Complaint  Patient presents with   Follow-up    ED follow up, elevated blood pressure, dizziness, irregular heart rate, headache    Diabetes   Gastroesophageal Reflux   Hyperlipidemia   Hypertension   Anxiety   Asthma    HPI Vanessa Evans presents for a follow up visit after an ER visit for dizziness and headache. Monday, she had her 5th injection in eye of avastin for macular degeneration. She became dizzy and had a headache. She went to the ER on 04/08/21 for these symptoms plus her blood pressure was elevated. In the ER, her blood pressure was elevated and heart rate was irregular but all testing was normal and she was sent home.  As of today, she reports that her headache and dizziness is resolved. And her blood pressure is back to normal.       Current Medication: Outpatient Encounter Medications as of 04/11/2021  Medication Sig   acetaminophen (TYLENOL) 500 MG tablet Take 1,000 mg by mouth 2 (two) times daily as needed for moderate pain.   albuterol (PROVENTIL) (2.5 MG/3ML) 0.083% nebulizer solution Take 3 mLs (2.5 mg total) by nebulization every 6 (six) hours as needed for wheezing or shortness of breath.   albuterol (VENTOLIN HFA) 108 (90 Base) MCG/ACT inhaler Inhale 2 puffs into the lungs every 4 (four) hours as needed for wheezing or shortness of breath.   aspirin EC 81 MG tablet Take 81 mg by mouth at bedtime.    benzonatate (TESSALON) 100 MG capsule Take 2 capsules (200 mg total) by mouth 2 (two) times daily as needed for cough.   BLACK ELDERBERRY PO Take 1 Dose by mouth 2 (two) times a day.   Calcium Carbonate-Vitamin D 600-400 MG-UNIT tablet Take 1 tablet by mouth 2 (two) times daily.   celecoxib (CELEBREX) 100 MG capsule Take 100 mg by mouth 2 (two) times daily.   cholecalciferol  (VITAMIN D) 1000 UNITS tablet Take 1,000 Units by mouth at bedtime.    citalopram (CELEXA) 10 MG tablet Take 1 tablet (10 mg total) by mouth daily.   cyanocobalamin 500 MCG tablet Take 500 mcg by mouth daily.   Dextran 70-Hypromellose (ARTIFICIAL TEARS) 0.1-0.3 % SOLN Apply 1-2 drops to eye daily.   diclofenac Sodium (VOLTAREN) 1 % GEL Apply 4 g topically 4 (four) times daily.   diltiazem (CARDIZEM CD) 120 MG 24 hr capsule Take 120 mg by mouth at bedtime.    diphenhydrAMINE (BENADRYL) 25 MG tablet Take 25-50 mg by mouth daily as needed for itching.   docusate sodium (COLACE) 100 MG capsule Take 200 mg by mouth at bedtime as needed for moderate constipation.    EPINEPHrine (EPIPEN 2-PAK) 0.3 mg/0.3 mL IJ SOAJ injection use as directed for severe allergy reaction   fenofibrate 160 MG tablet TAKE 1 TABLET DAILY FOR CHOLESTEROL   fluticasone (FLONASE) 50 MCG/ACT nasal spray Use 2 sprays in each nostril daily,   fluticasone (FLONASE) 50 MCG/ACT nasal spray Place into the nose.   folic acid (FOLVITE) A999333 MCG tablet Take 400 mcg by mouth daily.   guaiFENesin (MUCINEX) 600 MG 12 hr tablet Take 600 mg by mouth at bedtime as needed (congestion).   Hypromellose (ARTIFICIAL TEARS OP) Place 1-2 drops into both eyes daily as needed (dry eyes).   ketotifen (ZADITOR) 0.025 %  ophthalmic solution Place 1 drop into both eyes 2 (two) times daily as needed (allergies).   levocetirizine (XYZAL) 5 MG tablet Take 1 tablet (5 mg total) by mouth every evening.   levothyroxine (SYNTHROID, LEVOTHROID) 50 MCG tablet Take 50 mcg by mouth daily before breakfast. Brand Name only   Magnesium 250 MG TABS Take 1 tablet by mouth daily.   Melatonin 5 MG TABS Take 1 tablet by mouth at bedtime. For sleep   Multiple Vitamins-Minerals (MULTIVITAMIN WITH MINERALS) tablet Take 1 tablet by mouth daily.   OXYGEN Inhale into the lungs. 2 litre at night   pantoprazole (PROTONIX) 40 MG tablet Take 1 tablet (40 mg total) by mouth 2 (two)  times daily.   pyridoxine (B-6) 100 MG tablet Take 100 mg by mouth daily.   sodium chloride (OCEAN) 0.65 % SOLN nasal spray Place 1 spray into both nostrils 2 (two) times daily.   sucralfate (CARAFATE) 1 g tablet Take 3 times daily with meals.   Vitamin A 7.5 MG (25000 UT) CAPS Take 1 tablet by mouth 2 (two) times daily.   zinc gluconate 50 MG tablet Take 50 mg by mouth daily.   [DISCONTINUED] amoxicillin-clavulanate (AUGMENTIN) 875-125 MG tablet Take 1 tablet by mouth 2 (two) times daily. (Patient not taking: Reported on 04/11/2021)   Facility-Administered Encounter Medications as of 04/11/2021  Medication   betamethasone acetate-betamethasone sodium phosphate (CELESTONE) injection 3 mg    Surgical History: Past Surgical History:  Procedure Laterality Date   BACK SURGERY  2014   removed bone to get to a benign tumor that was pushing on spinal column   BIOPSY THYROID  2018   bladder biopsies  2003   9 biopsies done by dr. cope.  all benign.  inflammatory process going on in bladder   BREAST BIOPSY Right    benign   BREAST SURGERY Right    lumpectomy   CATARACT EXTRACTION W/PHACO Left 12/25/2014   Procedure: CATARACT EXTRACTION PHACO AND INTRAOCULAR LENS PLACEMENT ([place]);  Surgeon: Birder Robson, MD;  Location: [facility] ORS;  Service: Ophthalmology;  Laterality: Left;  [place] 00:32 AP% 20.0 CDE 6.49   COLONOSCOPY W/ BIOPSIES     COLONOSCOPY W/ POLYPECTOMY  2002, 2004, 2005   adenomatous polyps removed   COLONOSCOPY WITH PROPOFOL N/A 08/23/2017   Procedure: COLONOSCOPY WITH PROPOFOL;  Surgeon: Manya Silvas, MD;  Location: [facility] ENDOSCOPY;  Service: Endoscopy;  Laterality: N/A;   COLONOSCOPY WITH PROPOFOL N/A 11/08/2017   Procedure: COLONOSCOPY WITH PROPOFOL;  Surgeon: Manya Silvas, MD;  Location: [facility] ENDOSCOPY;  Service: Endoscopy;  Laterality: N/A;   CYSTOCELE REPAIR N/A 04/09/2016   Procedure: ANTERIOR REPAIR (CYSTOCELE);  Surgeon: Gae Dry, MD;  Location: [facility] ORS;  Service:  Gynecology;  Laterality: N/A;   DIAGNOSTIC LAPAROSCOPY  2008   removed both ovaries with cysts, tubes and fibroids   DILATION AND CURETTAGE OF UTERUS  1973   ESOPHAGOGASTRODUODENOSCOPY (EGD) WITH PROPOFOL N/A 08/23/2017   Procedure: ESOPHAGOGASTRODUODENOSCOPY (EGD) WITH PROPOFOL;  Surgeon: Manya Silvas, MD;  Location: [facility] ENDOSCOPY;  Service: Endoscopy;  Laterality: N/A;   EYE SURGERY Bilateral 2016   HERNIA REPAIR Right 2008   Inguinal hernia Repair, ventral hernia repair   JOINT REPLACEMENT Right 2008   knee   KNEE ARTHROSCOPY Right    LUMBAR LAMINECTOMY/DECOMPRESSION MICRODISCECTOMY Right 03/21/2013   Procedure: Right Lumbar five-Sacral one Laminectomy for Synovial Cyst;  Surgeon: Faythe Ghee, MD;  Location: [facility] NEURO ORS;  Service: Neurosurgery;  Laterality: Right;  right   OOPHORECTOMY     REVERSE SHOULDER ARTHROPLASTY Right 02/22/2018   Procedure: REVERSE SHOULDER ARTHROPLASTY;  Surgeon: Corky Mull, MD;  Location: [facility] ORS;  Service: Orthopedics;  Laterality: Right;   TUBAL LIGATION     UNILATERAL SALPINGECTOMY Left 04/09/2016   Procedure: UNILATERAL SALPINGECTOMY;  Surgeon: Gae Dry, MD;  Location: [facility] ORS;  Service: Gynecology;  Laterality: Left;   UPPER GI ENDOSCOPY  2010   with biopsy of gastric erosion   VAGINAL HYSTERECTOMY N/A 04/09/2016   Procedure: HYSTERECTOMY VAGINAL;  Surgeon: Gae Dry, MD;  Location: [facility] ORS;  Service: Gynecology;  Laterality: N/A;   VAGINAL HYSTERECTOMY  2017   and bladder tack    Medical History: Past Medical History:  Diagnosis Date   Arthritis    osteo   Asthma    needs rescue inhaler few times a year   Claustrophobia 04/21/2020   Depression    Dizziness    light headed spells but it has been a while   Dysrhythmia    tachycardia.(cardizem). brady during procedures   Fibromyalgia    GERD (gastroesophageal reflux disease)    gastritis   Headache(784.0)    Heart murmur 2019   aortic. dr. Nehemiah Massed not worried  about this   Hyperlipidemia    Hypertension    Hypothyroidism    Macular degeneration    Migraine    Orthopnea    Pneumonia    long time ago (greater than 5 years ago)   PONV (postoperative nausea and vomiting)    very anxious during cataract surgery. brady during colonoscopy   Shortness of breath    any exertion, cannot lie flat    Family History: Family History  Problem Relation Age of Onset   Pulmonary fibrosis Mother    Melanoma Father    COPD Sister    Cardiomyopathy Sister        and arrhythmia   Atrial fibrillation Sister    COPD Brother    Pulmonary fibrosis Brother    Cancer Brother    Breast cancer Cousin        paternal 1st    Social History   Socioeconomic History   Marital status: Married    Spouse name: Not on file   Number of children: Not on file   Years of education: Not on file   Highest education level: Not on file  Occupational History   Not on file  Tobacco Use   Smoking status: Former    Types: Cigarettes    Quit date: 1967    Years since quitting: 55.7   Smokeless tobacco: Never  Vaping Use   Vaping Use: Never used  Substance and Sexual Activity   Alcohol use: Not Currently   Drug use: No   Sexual activity: Not Currently  Other Topics Concern   Not on file  Social History Narrative   Not on file   Social Determinants of Health   Financial Resource Strain: Not on file  Food Insecurity: Not on file  Transportation Needs: Not on file  Physical Activity: Not on file  Stress: Not on file  Social Connections: Not on file  Intimate Partner Violence: Not on file      Review of Systems  Constitutional:  Negative for chills, fatigue and unexpected weight change.  HENT:  Negative for congestion, rhinorrhea, sneezing and sore throat.   Eyes:  Negative for redness.  Respiratory:  Negative for cough, chest tightness and shortness of breath.  Cardiovascular:  Negative for chest pain and palpitations.  Gastrointestinal:  Negative  for abdominal pain, constipation, diarrhea, nausea and vomiting.  Genitourinary:  Negative for dysuria and frequency.  Musculoskeletal:  Negative for arthralgias, back pain, joint swelling and neck pain.  Skin:  Negative for rash.  Neurological: Negative.  Negative for tremors and numbness.  Hematological:  Negative for adenopathy. Does not bruise/bleed easily.  Psychiatric/Behavioral:  Negative for behavioral problems (Depression), sleep disturbance and suicidal ideas. The patient is not nervous/anxious.    Vital Signs: BP 132/78 Comment: 132/80  Pulse 85   Temp 98.8 F (37.1 C)   Resp 16   Ht 5' 0.25" (1.53 m)   Wt 148 lb 12.8 oz (67.5 kg)   SpO2 98%   BMI 28.82 kg/m    Physical Exam Vitals reviewed.  Constitutional:      General: She is not in acute distress.    Appearance: Normal appearance. She is normal weight. She is not ill-appearing.  HENT:     Head: Normocephalic and atraumatic.  Eyes:     Extraocular Movements: Extraocular movements intact.     Pupils: Pupils are equal, round, and reactive to light.  Cardiovascular:     Rate and Rhythm: Normal rate and regular rhythm.  Pulmonary:     Effort: Pulmonary effort is normal. No respiratory distress.  Neurological:     Mental Status: She is alert and oriented to person, place, and time.  Psychiatric:        Mood and Affect: Mood normal.        Behavior: Behavior normal.    Assessment/Plan: 1. Benign essential HTN BP readings have normalized and are no longer high at home, BP was appropriate during office visit today.  2. Dizziness resolved  3. Headache around the eyes resolved   General Counseling: Dane verbalizes understanding of the findings of todays visit and agrees with plan of treatment. I have discussed any further diagnostic evaluation that may be needed or ordered today. We also reviewed her medications today. she has been encouraged to call the office with any questions or concerns that should  arise related to todays visit.    No orders of the defined types were placed in this encounter.   No orders of the defined types were placed in this encounter.   Return if symptoms worsen or fail to improve.   Total time spent:20 Minutes Time spent includes review of chart, medications, test results, and follow up plan with the patient.   [place] Controlled Substance Database was reviewed by me.  This patient was seen by Jonetta Osgood, FNP-C in collaboration with Dr. Clayborn Bigness as a part of collaborative care agreement.   Lovada Barwick R. Valetta Fuller, MSN, FNP-C Internal medicine

## 2021-04-18 DIAGNOSIS — R002 Palpitations: Secondary | ICD-10-CM | POA: Diagnosis not present

## 2021-04-18 DIAGNOSIS — I471 Supraventricular tachycardia: Secondary | ICD-10-CM | POA: Diagnosis not present

## 2021-04-23 DIAGNOSIS — H518 Other specified disorders of binocular movement: Secondary | ICD-10-CM | POA: Diagnosis not present

## 2021-04-28 ENCOUNTER — Ambulatory Visit: Payer: Medicare Other | Admitting: Internal Medicine

## 2021-05-05 DIAGNOSIS — I471 Supraventricular tachycardia: Secondary | ICD-10-CM | POA: Diagnosis not present

## 2021-05-05 DIAGNOSIS — Z23 Encounter for immunization: Secondary | ICD-10-CM | POA: Diagnosis not present

## 2021-05-05 DIAGNOSIS — R002 Palpitations: Secondary | ICD-10-CM | POA: Diagnosis not present

## 2021-05-07 DIAGNOSIS — R739 Hyperglycemia, unspecified: Secondary | ICD-10-CM | POA: Diagnosis not present

## 2021-05-07 DIAGNOSIS — E042 Nontoxic multinodular goiter: Secondary | ICD-10-CM | POA: Diagnosis not present

## 2021-05-07 DIAGNOSIS — E039 Hypothyroidism, unspecified: Secondary | ICD-10-CM | POA: Diagnosis not present

## 2021-05-12 ENCOUNTER — Ambulatory Visit (INDEPENDENT_AMBULATORY_CARE_PROVIDER_SITE_OTHER): Payer: Medicare Other | Admitting: Internal Medicine

## 2021-05-12 ENCOUNTER — Encounter: Payer: Self-pay | Admitting: Internal Medicine

## 2021-05-12 ENCOUNTER — Other Ambulatory Visit: Payer: Self-pay

## 2021-05-12 VITALS — BP 144/70 | HR 99 | Temp 98.2°F | Resp 16 | Ht 60.25 in | Wt 151.0 lb

## 2021-05-12 DIAGNOSIS — J4520 Mild intermittent asthma, uncomplicated: Secondary | ICD-10-CM | POA: Diagnosis not present

## 2021-05-12 DIAGNOSIS — K219 Gastro-esophageal reflux disease without esophagitis: Secondary | ICD-10-CM

## 2021-05-12 DIAGNOSIS — R0602 Shortness of breath: Secondary | ICD-10-CM | POA: Diagnosis not present

## 2021-05-12 DIAGNOSIS — I2510 Atherosclerotic heart disease of native coronary artery without angina pectoris: Secondary | ICD-10-CM | POA: Diagnosis not present

## 2021-05-12 DIAGNOSIS — I2583 Coronary atherosclerosis due to lipid rich plaque: Secondary | ICD-10-CM

## 2021-05-12 DIAGNOSIS — I2584 Coronary atherosclerosis due to calcified coronary lesion: Secondary | ICD-10-CM | POA: Diagnosis not present

## 2021-05-12 NOTE — Progress Notes (Signed)
[facility] [place], [place] [address]  Pulmonary Sleep Medicine   Office Visit Note  Patient Name: Vanessa Evans DOB: 02-Dec-1945 MRN [id number]  Date of Service: 05/12/2021  Complaints/HPI: Chronic Asthma. Family history of IPF.  She continues to have concern however idiopathic pulmonary fibrosis I have reassured her as far as her serial CTs being negative but she still wants to continue to follow along.  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  As far as her asthma is controlled this has been under good control she does still get some occasional wheezing and shortness of breath along with cough but she is using the medications which have been helping her significantly.  ROS  General: (-) fever, (-) chills, (-) night sweats, (-) weakness Skin: (-) rashes, (-) itching,. Eyes: (-) visual changes, (-) redness, (-) itching. Nose and Sinuses: (-) nasal stuffiness or itchiness, (-) postnasal drip, (-) nosebleeds, (-) sinus trouble. Mouth and Throat: (-) sore throat, (-) hoarseness. Neck: (-) swollen glands, (-) enlarged thyroid, (-) neck pain. Respiratory: + cough, (-) bloody sputum, + shortness of breath, + wheezing. Cardiovascular: - ankle swelling, (-) chest pain. Lymphatic: (-) lymph node enlargement. Neurologic: (-) numbness, (-) tingling. Psychiatric: (-) anxiety, (-) depression   Current Medication: Outpatient Encounter Medications as of 05/12/2021  Medication Sig   acetaminophen (TYLENOL) 500 MG tablet Take 1,000 mg by mouth 2 (two) times daily as needed for moderate pain.   albuterol (PROVENTIL) (2.5 MG/3ML) 0.083% nebulizer solution Take 3 mLs (2.5 mg total) by nebulization every 6 (six) hours as needed for wheezing or shortness of breath.   albuterol (VENTOLIN HFA) 108 (90 Base) MCG/ACT inhaler Inhale 2 puffs into the lungs every 4 (four) hours as needed for wheezing or shortness of breath.   aspirin EC 81 MG  tablet Take 81 mg by mouth at bedtime.    benzonatate (TESSALON) 100 MG capsule Take 2 capsules (200 mg total) by mouth 2 (two) times daily as needed for cough.   BLACK ELDERBERRY PO Take 1 Dose by mouth 2 (two) times a day.   Calcium Carbonate-Vitamin D 600-400 MG-UNIT tablet Take 1 tablet by mouth 2 (two) times daily.   cholecalciferol (VITAMIN D) 1000 UNITS tablet Take 1,000 Units by mouth at bedtime.    citalopram (CELEXA) 10 MG tablet Take 1 tablet (10 mg total) by mouth daily.   cyanocobalamin 500 MCG tablet Take 500 mcg by mouth daily.   Dextran 70-Hypromellose (ARTIFICIAL TEARS) 0.1-0.3 % SOLN Apply 1-2 drops to eye daily.   diclofenac Sodium (VOLTAREN) 1 % GEL Apply 4 g topically 4 (four) times daily.   diltiazem (CARDIZEM CD) 120 MG 24 hr capsule Take 120 mg by mouth at bedtime.    diphenhydrAMINE (BENADRYL) 25 MG tablet Take 25-50 mg by mouth daily as needed for itching.   docusate sodium (COLACE) 100 MG capsule Take 200 mg by mouth at bedtime as needed for moderate constipation.    EPINEPHrine (EPIPEN 2-PAK) 0.3 mg/0.3 mL IJ SOAJ injection use as directed for severe allergy reaction   fenofibrate 160 MG tablet TAKE 1 TABLET DAILY FOR CHOLESTEROL   fluticasone (FLONASE) 50 MCG/ACT nasal spray Use 2 sprays in each nostril daily,   fluticasone (FLONASE) 50 MCG/ACT nasal spray Place into the nose.   folic acid (FOLVITE) 937 MCG tablet Take 400 mcg by mouth daily.   guaiFENesin (MUCINEX) 600 MG 12 hr tablet Take 600 mg by mouth at bedtime as  needed (congestion).   Hypromellose (ARTIFICIAL TEARS OP) Place 1-2 drops into both eyes daily as needed (dry eyes).   ketotifen (ZADITOR) 0.025 % ophthalmic solution Place 1 drop into both eyes 2 (two) times daily as needed (allergies).   levocetirizine (XYZAL) 5 MG tablet Take 1 tablet (5 mg total) by mouth every evening.   levothyroxine (SYNTHROID, LEVOTHROID) 50 MCG tablet Take 50 mcg by mouth daily before breakfast. Brand Name only   Magnesium  250 MG TABS Take 1 tablet by mouth daily.   Melatonin 5 MG TABS Take 1 tablet by mouth at bedtime. For sleep   Multiple Vitamins-Minerals (MULTIVITAMIN WITH MINERALS) tablet Take 1 tablet by mouth daily.   OXYGEN Inhale into the lungs. 2 litre at night   pantoprazole (PROTONIX) 40 MG tablet Take 1 tablet (40 mg total) by mouth 2 (two) times daily.   pyridoxine (B-6) 100 MG tablet Take 100 mg by mouth daily.   sodium chloride (OCEAN) 0.65 % SOLN nasal spray Place 1 spray into both nostrils 2 (two) times daily.   sucralfate (CARAFATE) 1 g tablet Take 3 times daily with meals.   Vitamin A 7.5 MG (25000 UT) CAPS Take 1 tablet by mouth 2 (two) times daily.   zinc gluconate 50 MG tablet Take 50 mg by mouth daily.   [DISCONTINUED] celecoxib (CELEBREX) 100 MG capsule Take 100 mg by mouth 2 (two) times daily. (Patient not taking: Reported on 05/12/2021)   Facility-Administered Encounter Medications as of 05/12/2021  Medication   betamethasone acetate-betamethasone sodium phosphate (CELESTONE) injection 3 mg    Surgical History: Past Surgical History:  Procedure Laterality Date   BACK SURGERY  2014   removed bone to get to a benign tumor that was pushing on spinal column   BIOPSY THYROID  2018   bladder biopsies  2003   9 biopsies done by dr. cope.  all benign.  inflammatory process going on in bladder   BREAST BIOPSY Right    benign   BREAST SURGERY Right    lumpectomy   CATARACT EXTRACTION W/PHACO Left 12/25/2014   Procedure: CATARACT EXTRACTION PHACO AND INTRAOCULAR LENS PLACEMENT (Ramirez-Perez);  Surgeon: Birder Robson, MD;  Location: [facility] ORS;  Service: Ophthalmology;  Laterality: Left;  [place] 00:32 AP% 20.0 CDE 6.49   COLONOSCOPY W/ BIOPSIES     COLONOSCOPY W/ POLYPECTOMY  2002, 2004, 2005   adenomatous polyps removed   COLONOSCOPY WITH PROPOFOL N/A 08/23/2017   Procedure: COLONOSCOPY WITH PROPOFOL;  Surgeon: Manya Silvas, MD;  Location: [facility] ENDOSCOPY;  Service: Endoscopy;  Laterality: N/A;    COLONOSCOPY WITH PROPOFOL N/A 11/08/2017   Procedure: COLONOSCOPY WITH PROPOFOL;  Surgeon: Manya Silvas, MD;  Location: [facility] ENDOSCOPY;  Service: Endoscopy;  Laterality: N/A;   CYSTOCELE REPAIR N/A 04/09/2016   Procedure: ANTERIOR REPAIR (CYSTOCELE);  Surgeon: Gae Dry, MD;  Location: [facility] ORS;  Service: Gynecology;  Laterality: N/A;   DIAGNOSTIC LAPAROSCOPY  2008   removed both ovaries with cysts, tubes and fibroids   DILATION AND CURETTAGE OF UTERUS  1973   ESOPHAGOGASTRODUODENOSCOPY (EGD) WITH PROPOFOL N/A 08/23/2017   Procedure: ESOPHAGOGASTRODUODENOSCOPY (EGD) WITH PROPOFOL;  Surgeon: Manya Silvas, MD;  Location: [facility] ENDOSCOPY;  Service: Endoscopy;  Laterality: N/A;   EYE SURGERY Bilateral 2016   HERNIA REPAIR Right 2008   Inguinal hernia Repair, ventral hernia repair   JOINT REPLACEMENT Right 2008   knee   KNEE ARTHROSCOPY Right    LUMBAR LAMINECTOMY/DECOMPRESSION MICRODISCECTOMY Right 03/21/2013   Procedure: Right  Lumbar five-Sacral one Laminectomy for Synovial Cyst;  Surgeon: Faythe Ghee, MD;  Location: [place] NEURO ORS;  Service: Neurosurgery;  Laterality: Right;  right   OOPHORECTOMY     REVERSE SHOULDER ARTHROPLASTY Right 02/22/2018   Procedure: REVERSE SHOULDER ARTHROPLASTY;  Surgeon: Corky Mull, MD;  Location: [facility] ORS;  Service: Orthopedics;  Laterality: Right;   TUBAL LIGATION     UNILATERAL SALPINGECTOMY Left 04/09/2016   Procedure: UNILATERAL SALPINGECTOMY;  Surgeon: Gae Dry, MD;  Location: [facility] ORS;  Service: Gynecology;  Laterality: Left;   UPPER GI ENDOSCOPY  2010   with biopsy of gastric erosion   VAGINAL HYSTERECTOMY N/A 04/09/2016   Procedure: HYSTERECTOMY VAGINAL;  Surgeon: Gae Dry, MD;  Location: [facility] ORS;  Service: Gynecology;  Laterality: N/A;   VAGINAL HYSTERECTOMY  2017   and bladder tack    Medical History: Past Medical History:  Diagnosis Date   Arthritis    osteo   Asthma    needs rescue inhaler few times a year    Claustrophobia 04/21/2020   Depression    Dizziness    light headed spells but it has been a while   Dysrhythmia    tachycardia.(cardizem). brady during procedures   Fibromyalgia    GERD (gastroesophageal reflux disease)    gastritis   Headache(784.0)    Heart murmur 2019   aortic. dr. Nehemiah Massed not worried about this   Hyperlipidemia    Hypertension    Hypothyroidism    Macular degeneration    Migraine    Orthopnea    Pneumonia    long time ago (greater than 5 years ago)   PONV (postoperative nausea and vomiting)    very anxious during cataract surgery. brady during colonoscopy   Shortness of breath    any exertion, cannot lie flat    Family History: Family History  Problem Relation Age of Onset   Pulmonary fibrosis Mother    Melanoma Father    COPD Sister    Cardiomyopathy Sister        and arrhythmia   Atrial fibrillation Sister    COPD Brother    Pulmonary fibrosis Brother    Cancer Brother    Breast cancer Cousin        paternal 1st    Social History: Social History   Socioeconomic History   Marital status: Married    Spouse name: Not on file   Number of children: Not on file   Years of education: Not on file   Highest education level: Not on file  Occupational History   Not on file  Tobacco Use   Smoking status: Former    Types: Cigarettes    Quit date: 1967    Years since quitting: 55.7   Smokeless tobacco: Never  Vaping Use   Vaping Use: Never used  Substance and Sexual Activity   Alcohol use: Not Currently   Drug use: No   Sexual activity: Not Currently  Other Topics Concern   Not on file  Social History Narrative   Not on file   Social Determinants of Health   Financial Resource Strain: Not on file  Food Insecurity: Not on file  Transportation Needs: Not on file  Physical Activity: Not on file  Stress: Not on file  Social Connections: Not on file  Intimate Partner Violence: Not on file    Vital Signs: Blood pressure (!) 144/70,  pulse 99, temperature 98.2 F (36.8 C), resp. rate 16, height 5' 0.25" (1.53  m), weight 151 lb (68.5 kg), SpO2 95 %.  Examination: General Appearance: The patient is well-developed, well-nourished, and in no distress. Skin: Gross inspection of skin unremarkable. Head: normocephalic, no gross deformities. Eyes: no gross deformities noted. ENT: ears appear grossly normal no exudates. Neck: Supple. No thyromegaly. No LAD. Respiratory: no rhonchi noted. Cardiovascular: Normal S1 and S2 without murmur or rub. Extremities: No cyanosis. pulses are equal. Neurologic: Alert and oriented. No involuntary movements.  LABS: Recent Results (from the past 2160 hour(s))  Basic metabolic panel     Status: Abnormal   Collection Time: 04/08/21  1:35 PM  Result Value Ref Range   Sodium 137 135 - 145 mmol/L   Potassium 4.9 3.5 - 5.1 mmol/L   Chloride 99 98 - 111 mmol/L   CO2 29 22 - 32 mmol/L   Glucose, Bld 96 70 - 99 mg/dL    Comment: Glucose reference range applies only to samples taken after fasting for at least 8 hours.   BUN 25 (H) 8 - 23 mg/dL   Creatinine, Ser 0.70 0.44 - 1.00 mg/dL   Calcium 10.0 8.9 - 10.3 mg/dL   GFR, Estimated >60 >60 mL/min    Comment: (NOTE) Calculated using the CKD-EPI Creatinine Equation (2021)    Anion gap 9 5 - 15    Comment: Performed at [facility], [address] [place], [place] [address]  CBC     Status: None   Collection Time: 04/08/21  1:35 PM  Result Value Ref Range   WBC 8.9 4.0 - 10.5 K/uL   RBC 4.67 3.87 - 5.11 MIL/uL   Hemoglobin 13.7 12.0 - 15.0 g/dL   HCT 41.9 36.0 - 46.0 %   MCV 89.7 80.0 - 100.0 fL   MCH 29.3 26.0 - 34.0 pg   MCHC 32.7 30.0 - 36.0 g/dL   RDW 13.4 11.5 - 15.5 %   Platelets 301 150 - 400 K/uL   nRBC 0.0 0.0 - 0.2 %    Comment: Performed at [facility], [place], [place] [address]  Troponin I (High Sensitivity)     Status: None   Collection Time: 04/08/21  1:35 PM  Result Value Ref  Range   Troponin I (High Sensitivity) 5 <18 ng/L    Comment: (NOTE) Elevated high sensitivity troponin I (hsTnI) values and significant  changes across serial measurements may suggest ACS but many other  chronic and acute conditions are known to elevate hsTnI results.  Refer to the "Links" section for chest pain algorithms and additional  guidance. Performed at [facility], [place], [place] [address]   Troponin I (High Sensitivity)     Status: None   Collection Time: 04/08/21  5:53 PM  Result Value Ref Range   Troponin I (High Sensitivity) 5 <18 ng/L    Comment: (NOTE) Elevated high sensitivity troponin I (hsTnI) values and significant  changes across serial measurements may suggest ACS but many other  chronic and acute conditions are known to elevate hsTnI results.  Refer to the "Links" section for chest pain algorithms and additional  guidance. Performed at [facility], [address] [place], [place] [address]   Resp Panel by RT-PCR (Flu A&B, Covid) Nasopharyngeal Swab     Status: None   Collection Time: 04/08/21  6:12 PM   Specimen: Nasopharyngeal Swab; Nasopharyngeal(NP) swabs in vial transport medium  Result Value Ref Range   SARS Coronavirus 2 by RT PCR NEGATIVE NEGATIVE  Comment: (NOTE) SARS-CoV-2 target nucleic acids are NOT DETECTED.  The SARS-CoV-2 RNA is generally detectable in upper respiratory specimens during the acute phase of infection. The lowest concentration of SARS-CoV-2 viral copies this assay can detect is 138 copies/mL. A negative result does not preclude SARS-Cov-2 infection and should not be used as the sole basis for treatment or other patient management decisions. A negative result may occur with  improper specimen collection/handling, submission of specimen other than nasopharyngeal swab, presence of viral mutation(s) within the areas targeted by this assay, and inadequate number of viral copies(<138  copies/mL). A negative result must be combined with clinical observations, patient history, and epidemiological information. The expected result is Negative.  Fact Sheet for Patients:  EntrepreneurPulse.com.au  Fact Sheet for Healthcare Providers:  IncredibleEmployment.be  This test is no t yet approved or cleared by the [place] FDA and  has been authorized for detection and/or diagnosis of SARS-CoV-2 by FDA under an Emergency Use Authorization (EUA). This EUA will remain  in effect (meaning this test can be used) for the duration of the COVID-19 declaration under Section 564(b)(1) of the Act, 21 U.S.C.section 360bbb-3(b)(1), unless the authorization is terminated  or revoked sooner.       Influenza A by PCR NEGATIVE NEGATIVE   Influenza B by PCR NEGATIVE NEGATIVE    Comment: (NOTE) The Xpert Xpress SARS-CoV-2/FLU/RSV plus assay is intended as an aid in the diagnosis of influenza from Nasopharyngeal swab specimens and should not be used as a sole basis for treatment. Nasal washings and aspirates are unacceptable for Xpert Xpress SARS-CoV-2/FLU/RSV testing.  Fact Sheet for Patients: EntrepreneurPulse.com.au  Fact Sheet for Healthcare Providers: IncredibleEmployment.be  This test is not yet approved or cleared by the [place] FDA and has been authorized for detection and/or diagnosis of SARS-CoV-2 by FDA under an Emergency Use Authorization (EUA). This EUA will remain in effect (meaning this test can be used) for the duration of the COVID-19 declaration under Section 564(b)(1) of the Act, 21 U.S.C. section 360bbb-3(b)(1), unless the authorization is terminated or revoked.  Performed at [facility], [address] [place], [place] [address]     Radiology: DG Chest 2 View  Result Date: 04/08/2021 CLINICAL DATA:  Shortness of breath EXAM: CHEST - 2 VIEW COMPARISON:  Chest x-ray  dated June 07, 2017 FINDINGS: Cardiac and mediastinal contours are within normal limits. Unchanged right apical pleural thickening. Eventration of the right hemidiaphragm. No pleural effusion or pneumothorax IMPRESSION: No active cardiopulmonary disease. Electronically Signed   By: Yetta Glassman M.D.   On: 04/08/2021 14:09   CT HEAD WO CONTRAST (5MM)  Result Date: 04/08/2021 CLINICAL DATA:  Altered vision to the right eye. EXAM: CT HEAD WITHOUT CONTRAST TECHNIQUE: Contiguous axial images were obtained from the base of the skull through the vertex without intravenous contrast. COMPARISON:  None. FINDINGS: Brain: There is mild cerebral atrophy with widening of the extra-axial spaces and ventricular dilatation. There are areas of decreased attenuation within the white matter tracts of the supratentorial brain, consistent with microvascular disease changes. Vascular: No hyperdense vessel or unexpected calcification. Skull: Normal. Negative for acute fracture or focal lesion. A chronic deformity is seen along the right mandibular condyle Sinuses/Orbits: No acute finding. Other: None. IMPRESSION: 1. Generalized cerebral atrophy. 2. No acute intracranial abnormality. Electronically Signed   By: Virgina Norfolk M.D.   On: 04/08/2021 18:15    No results found.  No results found.    Assessment and Plan: Patient Active Problem  List   Diagnosis Date Noted   Coronary artery disease involving native coronary artery of native heart with angina pectoris ([place]) 10/28/2020   Peptic ulcer disease 04/21/2020   Claustrophobia 04/21/2020   Urinary tract infection without hematuria 02/04/2020   Pain in both lower extremities 12/18/2019   Inflammatory polyarthritis ([place]) 07/26/2019   Excessive daytime sleepiness 07/26/2019   Encounter for screening mammogram for malignant neoplasm of breast 07/26/2019   Intercostal muscle pain 06/25/2019   Positive ANA (antinuclear antibody) 05/01/2019   Primary  osteoarthritis involving multiple joints 04/26/2019   Mild intermittent asthma without complication 77/82/4235   Seasonal allergic rhinitis due to pollen 08/22/2018   Chills with fever 08/11/2018   Sore throat 08/11/2018   Acute upper respiratory infection 08/11/2018   Candidiasis 08/11/2018   Iron deficiency anemia 05/05/2018   Right hip pain 04/27/2018   Accidental fall from furniture 04/27/2018   Vitamin D deficiency 04/27/2018   Cough 04/18/2018   Dysuria 04/18/2018   Status post reverse total shoulder replacement, right 02/22/2018   Abnormal ECG 02/18/2018   Pain in joint of right shoulder 02/13/2018   Abdominal aortic atherosclerosis ([place]) 02/13/2018   Episode of recurrent major depressive disorder ([place]) 02/13/2018   Encounter for general adult medical examination with abnormal findings 02/13/2018   Lipoma of right shoulder 01/31/2018   Rotator cuff tendinitis, right 01/31/2018   Chronic superficial gastritis without bleeding 12/12/2017   Schatzki's ring 12/12/2017   Acute gastritis 11/08/2017   Nausea 10/18/2017   Other fatigue 10/18/2017   Allergic rhinitis 09/20/2017   Eczema 09/20/2017   Hematuria 09/20/2017   Mixed hyperlipidemia 09/20/2017   Osteoarthritis 09/20/2017   Osteoporosis, post-menopausal 09/20/2017   Palpitations 07/05/2017   Epigastric pain 05/24/2017   Gastroesophageal reflux disease without esophagitis 05/24/2017   Impingement syndrome of left shoulder 10/02/2016   Trochanteric bursitis of left hip 10/02/2016   SOB (shortness of breath) 04/16/2016   Uterine prolapse 04/09/2016   Cystocele 04/09/2016   Hyponatremia 04/09/2016   Benign essential HTN 11/18/2015   Paroxysmal supraventricular tachycardia ([place]) 11/18/2015   Premature ventricular contraction 07/20/2014   History of colonic polyps 05/29/2014   Asthma 02/05/2014   Chest pain 02/05/2014   Increased frequency of urination 02/05/2014   Tachycardia 02/05/2014   Female stress incontinence  12/14/2013   Gross hematuria 12/14/2013   Incomplete emptying of bladder 12/14/2013   Other chronic cystitis without hematuria 12/14/2013    1. Shortness of breath She has concerns for IPF she wanted to do another CT scan of her chest because of strong family history also had a sibling that apparently passed away with IPF - Spirometry with Graph  2. Chronic asthma, mild intermittent, uncomplicated Arlyce Harman results normal good control with current medical management.  She is on appropriate inhalers reviewed her medications with her at length.  She is on the Proventil.  She does take fluticasone for nasal allergies as well as Xyzal  3. Coronary artery disease due to lipid rich plaque This is stable no sign of active chest pain the plan is going to be to continue with medical management  4. GERD without esophagitis Under control use PPIs as directed.  General Counseling: I have discussed the findings of the evaluation and examination with Natividad.  I have also discussed any further diagnostic evaluation thatmay be needed or ordered today. Hadleigh verbalizes understanding of the findings of todays visit. We also reviewed her medications today and discussed drug interactions and side effects including but not limited excessive  drowsiness and altered mental states. We also discussed that there is always a risk not just to her but also people around her. she has been encouraged to call the office with any questions or concerns that should arise related to todays visit.  Orders Placed This Encounter  Procedures   Spirometry with Graph    Order Specific Question:   Where should this test be performed?    Answer:   [facility]    Order Specific Question:   Basic spirometry    Answer:   Yes    Order Specific Question:   Spirometry pre & post bronchodilator    Answer:   No     Time spent: 45  I have personally obtained a history, examined the patient, evaluated laboratory and imaging  results, formulated the assessment and plan and placed orders.    Allyne Gee, MD [facility] Pulmonary and Critical Care Sleep medicine

## 2021-05-12 NOTE — Patient Instructions (Signed)
Asthma, Adult Asthma is a long-term (chronic) condition in which the airways get tight and narrow. The airways are the breathing passages that lead from the nose and mouth down into the lungs. A person with asthma will have times when symptoms get worse. These are called asthma attacks. They can cause coughing, whistling sounds when you breathe (wheezing), shortness of breath, and chest pain. They can make it hard to breathe. There is no cure for asthma, but medicines and lifestyle changes can help control it. There are many things that can bring on an asthma attack or make asthma symptoms worse (triggers). Common triggers include: Mold. Dust. Cigarette smoke. Cockroaches. Things that can cause allergy symptoms (allergens). These include animal skin flakes (dander) and pollen from trees or grass. Things that pollute the air. These may include household cleaners, wood smoke, smog, or chemical odors. Cold air, weather changes, and wind. Crying or laughing hard. Stress. Certain medicines or drugs. Certain foods such as dried fruit, potato chips, and grape juice. Infections, such as a cold or the flu. Certain medical conditions or diseases. Exercise or tiring activities. Asthma may be treated with medicines and by staying away from the things that cause asthma attacks. Types of medicines may include: Controller medicines. These help prevent asthma symptoms. They are usually taken every day. Fast-acting reliever or rescue medicines. These quickly relieve asthma symptoms. They are used as needed and provide short-term relief. Allergy medicines if your attacks are brought on by allergens. Medicines to help control the body's defense (immune) system. Follow these instructions at home: Avoiding triggers in your home Change your heating and air conditioning filter often. Limit your use of fireplaces and wood stoves. Get rid of pests (such as roaches and mice) and their droppings. Throw away plants  if you see mold on them. Clean your floors. Dust regularly. Use cleaning products that do not smell. Have someone vacuum when you are not home. Use a vacuum cleaner with a HEPA filter if possible. Replace carpet with wood, tile, or vinyl flooring. Carpet can trap animal skin flakes and dust. Use allergy-proof pillows, mattress covers, and box spring covers. Wash bed sheets and blankets every week in hot water. Dry them in a dryer. Keep your bedroom free of any triggers. Avoid pets and keep windows closed when things that cause allergy symptoms are in the air. Use blankets that are made of polyester or cotton. Clean bathrooms and kitchens with bleach. If possible, have someone repaint the walls in these rooms with mold-resistant paint. Keep out of the rooms that are being cleaned and painted. Wash your hands often with soap and water. If soap and water are not available, use hand sanitizer. Do not allow anyone to smoke in your home. General instructions Take over-the-counter and prescription medicines only as told by your doctor. Talk with your doctor if you have questions about how or when to take your medicines. Make note if you need to use your medicines more often than usual. Do not use any products that contain nicotine or tobacco, such as cigarettes and e-cigarettes. If you need help quitting, ask your doctor. Stay away from secondhand smoke. Avoid doing things outdoors when allergen counts are high and when air quality is low. Wear a ski mask when doing outdoor activities in the winter. The mask should cover your nose and mouth. Exercise indoors on cold days if you can. Warm up before you exercise. Take time to cool down after exercise. Use a peak flow meter as  told by your doctor. A peak flow meter is a tool that measures how well the lungs are working. Keep track of the peak flow meter's readings. Write them down. Follow your asthma action plan. This is a written plan for taking care  of your asthma and treating your attacks. Make sure you get all the shots (vaccines) that your doctor recommends. Ask your doctor about a flu shot and a pneumonia shot. Keep all follow-up visits as told by your doctor. This is important. Contact a doctor if: You have wheezing, shortness of breath, or a cough even while taking medicine to prevent attacks. The mucus you cough up (sputum) is thicker than usual. The mucus you cough up changes from clear or white to yellow, green, gray, or bloody. You have problems from the medicine you are taking, such as: A rash. Itching. Swelling. Trouble breathing. You need reliever medicines more than 2-3 times a week. Your peak flow reading is still at 50-79% of your personal best after following the action plan for 1 hour. You have a fever. Get help right away if: You seem to be worse and are not responding to medicine during an asthma attack. You are short of breath even at rest. You get short of breath when doing very little activity. You have trouble eating, drinking, or talking. You have chest pain or tightness. You have a fast heartbeat. Your lips or fingernails start to turn blue. You are light-headed or dizzy, or you faint. Your peak flow is less than 50% of your personal best. You feel too tired to breathe normally. Summary Asthma is a long-term (chronic) condition in which the airways get tight and narrow. An asthma attack can make it hard to breathe. Asthma cannot be cured, but medicines and lifestyle changes can help control it. Make sure you understand how to avoid triggers and how and when to use your medicines. This information is not intended to replace advice given to you by your health care provider. Make sure you discuss any questions you have with your health care provider. Document Revised: 11/29/2019 Document Reviewed: 11/29/2019 Elsevier Patient Education  2022 [employer].

## 2021-05-13 DIAGNOSIS — K5909 Other constipation: Secondary | ICD-10-CM | POA: Diagnosis not present

## 2021-05-13 DIAGNOSIS — K219 Gastro-esophageal reflux disease without esophagitis: Secondary | ICD-10-CM | POA: Diagnosis not present

## 2021-05-13 DIAGNOSIS — Z86010 Personal history of colonic polyps: Secondary | ICD-10-CM | POA: Diagnosis not present

## 2021-05-14 ENCOUNTER — Telehealth: Payer: Self-pay

## 2021-05-14 NOTE — Telephone Encounter (Signed)
Received medical clearance form from [facility]. Giving to dsk on 05/15/21 for completion-Toni

## 2021-05-15 DIAGNOSIS — R739 Hyperglycemia, unspecified: Secondary | ICD-10-CM | POA: Diagnosis not present

## 2021-05-15 DIAGNOSIS — E042 Nontoxic multinodular goiter: Secondary | ICD-10-CM | POA: Diagnosis not present

## 2021-05-15 DIAGNOSIS — E039 Hypothyroidism, unspecified: Secondary | ICD-10-CM | POA: Diagnosis not present

## 2021-05-15 NOTE — Telephone Encounter (Signed)
Surgical clearance signed, faxed back to [phone], sent to be scanned-Toni

## 2021-06-03 DIAGNOSIS — Z23 Encounter for immunization: Secondary | ICD-10-CM | POA: Diagnosis not present

## 2021-06-10 ENCOUNTER — Encounter: Payer: Self-pay | Admitting: Oncology

## 2021-06-10 ENCOUNTER — Other Ambulatory Visit: Payer: Self-pay

## 2021-06-10 ENCOUNTER — Ambulatory Visit (INDEPENDENT_AMBULATORY_CARE_PROVIDER_SITE_OTHER): Payer: Medicare Other | Admitting: Podiatry

## 2021-06-10 DIAGNOSIS — M25571 Pain in right ankle and joints of right foot: Secondary | ICD-10-CM

## 2021-06-10 DIAGNOSIS — M7671 Peroneal tendinitis, right leg: Secondary | ICD-10-CM | POA: Diagnosis not present

## 2021-06-10 DIAGNOSIS — M7672 Peroneal tendinitis, left leg: Secondary | ICD-10-CM

## 2021-06-10 MED ORDER — BETAMETHASONE SOD PHOS & ACET 6 (3-3) MG/ML IJ SUSP: 3.0000 mg | Freq: Once | INTRAMUSCULAR | Status: DC

## 2021-06-10 NOTE — Progress Notes (Signed)
   HPI: 75 y.o. female presenting today for follow-up evaluation of bilateral foot pain.  Patient states the last injection she received several months prior helped significantly for a long period of time.  Slowly over the last few weeks the pain is returned.  She would like to receive injections again today.  She is not interested in surgery at the moment.  Past Medical History:  Diagnosis Date   Arthritis    osteo   Asthma    needs rescue inhaler few times a year   Claustrophobia 04/21/2020   Depression    Dizziness    light headed spells but it has been a while   Dysrhythmia    tachycardia.(cardizem). brady during procedures   Fibromyalgia    GERD (gastroesophageal reflux disease)    gastritis   Headache(784.0)    Heart murmur 2019   aortic. dr. Nehemiah Massed not worried about this   Hyperlipidemia    Hypertension    Hypothyroidism    Macular degeneration    Migraine    Orthopnea    Pneumonia    long time ago (greater than 5 years ago)   PONV (postoperative nausea and vomiting)    very anxious during cataract surgery. brady during colonoscopy   Shortness of breath    any exertion, cannot lie flat     Physical Exam: General: The patient is alert and oriented x3 in no acute distress.  Dermatology: Skin is warm, dry and supple bilateral lower extremities. Negative for open lesions or macerations.  Vascular: Palpable pedal pulses bilaterally. No edema or erythema noted. Capillary refill within normal limits.  Neurological: Epicritic and protective threshold grossly intact bilaterally.   Musculoskeletal Exam: Range of motion within normal limits to all pedal and ankle joints bilateral. Muscle strength 5/5 in all groups bilateral.  Pain on palpation noted to the sinus tarsi right as well as the peroneal tendons of the bilateral feet.   Assessment: 1.  Sinus tarsitis right 2.  Peroneal tendinitis bilateral   Plan of Care:  1. Patient evaluated. 2.  Injection of 0.5 cc  Celestone Soluspan injection the sinus tarsi right as well as along the peroneal tendon sheath bilateral 3.  Continue wearing good supportive shoes 4.  Return to clinic as needed      Edrick Kins, DPM [facility]  Dr. Edrick Kins, DPM    [address], [place] [address]                Office [phone]  Fax [phone]

## 2021-06-10 NOTE — Patient Instructions (Signed)
Use

## 2021-06-18 DIAGNOSIS — D2271 Melanocytic nevi of right lower limb, including hip: Secondary | ICD-10-CM | POA: Diagnosis not present

## 2021-06-18 DIAGNOSIS — D2262 Melanocytic nevi of left upper limb, including shoulder: Secondary | ICD-10-CM | POA: Diagnosis not present

## 2021-06-18 DIAGNOSIS — X32XXXA Exposure to sunlight, initial encounter: Secondary | ICD-10-CM | POA: Diagnosis not present

## 2021-06-18 DIAGNOSIS — L821 Other seborrheic keratosis: Secondary | ICD-10-CM | POA: Diagnosis not present

## 2021-06-18 DIAGNOSIS — L570 Actinic keratosis: Secondary | ICD-10-CM | POA: Diagnosis not present

## 2021-06-18 DIAGNOSIS — D225 Melanocytic nevi of trunk: Secondary | ICD-10-CM | POA: Diagnosis not present

## 2021-06-18 DIAGNOSIS — D2261 Melanocytic nevi of right upper limb, including shoulder: Secondary | ICD-10-CM | POA: Diagnosis not present

## 2021-06-18 DIAGNOSIS — D2272 Melanocytic nevi of left lower limb, including hip: Secondary | ICD-10-CM | POA: Diagnosis not present

## 2021-06-19 ENCOUNTER — Ambulatory Visit: Payer: Medicare Other | Admitting: Physician Assistant

## 2021-06-19 DIAGNOSIS — Z20828 Contact with and (suspected) exposure to other viral communicable diseases: Secondary | ICD-10-CM | POA: Diagnosis not present

## 2021-06-26 ENCOUNTER — Other Ambulatory Visit: Payer: Self-pay

## 2021-06-26 ENCOUNTER — Ambulatory Visit: Payer: Medicare Other | Admitting: Physician Assistant

## 2021-06-26 ENCOUNTER — Telehealth (INDEPENDENT_AMBULATORY_CARE_PROVIDER_SITE_OTHER): Payer: Medicare Other | Admitting: Physician Assistant

## 2021-06-26 DIAGNOSIS — J4520 Mild intermittent asthma, uncomplicated: Secondary | ICD-10-CM

## 2021-06-26 DIAGNOSIS — R6889 Other general symptoms and signs: Secondary | ICD-10-CM

## 2021-06-26 DIAGNOSIS — J301 Allergic rhinitis due to pollen: Secondary | ICD-10-CM | POA: Diagnosis not present

## 2021-06-26 DIAGNOSIS — J0100 Acute maxillary sinusitis, unspecified: Secondary | ICD-10-CM | POA: Diagnosis not present

## 2021-06-26 DIAGNOSIS — E782 Mixed hyperlipidemia: Secondary | ICD-10-CM | POA: Diagnosis not present

## 2021-06-26 LAB — POCT INFLUENZA A/B
Influenza A, POC: NEGATIVE
Influenza B, POC: NEGATIVE

## 2021-06-26 MED ORDER — AMOXICILLIN-POT CLAVULANATE 875-125 MG PO TABS: 1.0000 | ORAL_TABLET | Freq: Two times a day (BID) | ORAL | 0 refills | Status: DC

## 2021-06-26 MED ORDER — LEVOCETIRIZINE DIHYDROCHLORIDE 5 MG PO TABS: 5.0000 mg | ORAL_TABLET | Freq: Every evening | ORAL | 1 refills | Status: DC

## 2021-06-26 MED ORDER — ALBUTEROL SULFATE HFA 108 (90 BASE) MCG/ACT IN AERS: 2.0000 | INHALATION_SPRAY | RESPIRATORY_TRACT | 5 refills | Status: DC | PRN

## 2021-06-26 MED ORDER — FENOFIBRATE 160 MG PO TABS: ORAL_TABLET | ORAL | 1 refills | Status: DC

## 2021-06-26 NOTE — Progress Notes (Signed)
[facility], [place] [address]  Internal MEDICINE  Telephone Visit  Patient Name: Vanessa Evans  [id number]  [id number]  Date of Service: 07/01/2021  I connected with the patient at 11:45 by telephone and verified the patients identity using two identifiers.   I discussed the limitations, risks, security and privacy concerns of performing an evaluation and management service by telephone and the availability of in person appointments. I also discussed with the patient that there may be a patient responsible charge related to the service.  The patient expressed understanding and agrees to proceed.    Chief Complaint  Patient presents with   Telephone Screen   Telephone Assessment    Covid test is negative    Sinusitis   Generalized Body Aches    HPI Pt is here for virtual sick visit -Symptoms started a few days ago with aches and headaches and bad nasal congestion. Was around granddaughter last week and she had flu.  -Denies any wheezing or SOB -Flu test and covid negative. -Taking mucinex and tylenol and nasal spray -Had flu vaccine last month  Current Medication: Outpatient Encounter Medications as of 06/26/2021  Medication Sig   acetaminophen (TYLENOL) 500 MG tablet Take 1,000 mg by mouth 2 (two) times daily as needed for moderate pain.   albuterol (PROVENTIL) (2.5 MG/3ML) 0.083% nebulizer solution Take 3 mLs (2.5 mg total) by nebulization every 6 (six) hours as needed for wheezing or shortness of breath.   amoxicillin-clavulanate (AUGMENTIN) 875-125 MG tablet Take 1 tablet by mouth 2 (two) times daily. Take with food. (Patient not taking: Reported on 06/30/2021)   aspirin EC 81 MG tablet Take 81 mg by mouth at bedtime.    benzonatate (TESSALON) 100 MG capsule Take 2 capsules (200 mg total) by mouth 2 (two) times daily as needed for cough.   BLACK ELDERBERRY PO Take 1 Dose by mouth 2 (two) times a day.   Calcium Carbonate-Vitamin D  600-400 MG-UNIT tablet Take 1 tablet by mouth 2 (two) times daily.   cholecalciferol (VITAMIN D) 1000 UNITS tablet Take 1,000 Units by mouth at bedtime.    citalopram (CELEXA) 10 MG tablet Take 1 tablet (10 mg total) by mouth daily.   cyanocobalamin 500 MCG tablet Take 500 mcg by mouth daily.   Dextran 70-Hypromellose (ARTIFICIAL TEARS) 0.1-0.3 % SOLN Apply 1-2 drops to eye daily.   diclofenac Sodium (VOLTAREN) 1 % GEL Apply 4 g topically 4 (four) times daily.   diltiazem (CARDIZEM CD) 120 MG 24 hr capsule Take 120 mg by mouth at bedtime.    diphenhydrAMINE (BENADRYL) 25 MG tablet Take 25-50 mg by mouth daily as needed for itching.   docusate sodium (COLACE) 100 MG capsule Take 200 mg by mouth at bedtime as needed for moderate constipation.    EPINEPHrine (EPIPEN 2-PAK) 0.3 mg/0.3 mL IJ SOAJ injection use as directed for severe allergy reaction   fluticasone (FLONASE) 50 MCG/ACT nasal spray Use 2 sprays in each nostril daily,   fluticasone (FLONASE) 50 MCG/ACT nasal spray Place into the nose.   folic acid (FOLVITE) 096 MCG tablet Take 400 mcg by mouth daily.   guaiFENesin (MUCINEX) 600 MG 12 hr tablet Take 600 mg by mouth at bedtime as needed (congestion).   Hypromellose (ARTIFICIAL TEARS OP) Place 1-2 drops into both eyes daily as needed (dry eyes).   ketotifen (ZADITOR) 0.025 % ophthalmic solution Place 1 drop into both eyes 2 (two) times daily as needed (allergies).  levothyroxine (SYNTHROID, LEVOTHROID) 50 MCG tablet Take 50 mcg by mouth daily before breakfast. Brand Name only   Magnesium 250 MG TABS Take 1 tablet by mouth daily.   Melatonin 5 MG TABS Take 1 tablet by mouth at bedtime. For sleep   Multiple Vitamins-Minerals (MULTIVITAMIN WITH MINERALS) tablet Take 1 tablet by mouth daily.   OXYGEN Inhale into the lungs. 2 litre at night   pantoprazole (PROTONIX) 40 MG tablet Take 1 tablet (40 mg total) by mouth 2 (two) times daily.   pyridoxine (B-6) 100 MG tablet Take 100 mg by mouth  daily.   sodium chloride (OCEAN) 0.65 % SOLN nasal spray Place 1 spray into both nostrils 2 (two) times daily.   sucralfate (CARAFATE) 1 g tablet Take 3 times daily with meals.   Vitamin A 7.5 MG (25000 UT) CAPS Take 1 tablet by mouth 2 (two) times daily.   zinc gluconate 50 MG tablet Take 50 mg by mouth daily.   [DISCONTINUED] albuterol (VENTOLIN HFA) 108 (90 Base) MCG/ACT inhaler Inhale 2 puffs into the lungs every 4 (four) hours as needed for wheezing or shortness of breath.   [DISCONTINUED] fenofibrate 160 MG tablet TAKE 1 TABLET DAILY FOR CHOLESTEROL   [DISCONTINUED] levocetirizine (XYZAL) 5 MG tablet Take 1 tablet (5 mg total) by mouth every evening.   albuterol (VENTOLIN HFA) 108 (90 Base) MCG/ACT inhaler Inhale 2 puffs into the lungs every 4 (four) hours as needed for wheezing or shortness of breath.   fenofibrate 160 MG tablet TAKE 1 TABLET DAILY FOR CHOLESTEROL   levocetirizine (XYZAL) 5 MG tablet Take 1 tablet (5 mg total) by mouth every evening.   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 cope.  all benign.  inflammatory process going on in bladder   BREAST BIOPSY Right    benign   BREAST SURGERY Right    lumpectomy   CATARACT EXTRACTION W/PHACO Left 12/25/2014   Procedure: CATARACT EXTRACTION PHACO AND INTRAOCULAR LENS PLACEMENT ([place]);  Surgeon: Birder Robson, MD;  Location: [facility] ORS;  Service: Ophthalmology;  Laterality: Left;  [place] 00:32 AP% 20.0 CDE 6.49   COLONOSCOPY W/ BIOPSIES     COLONOSCOPY W/ POLYPECTOMY  2002, 2004, 2005    adenomatous polyps removed   COLONOSCOPY WITH PROPOFOL N/A 08/23/2017   Procedure: COLONOSCOPY WITH PROPOFOL;  Surgeon: Manya Silvas, MD;  Location: [facility] ENDOSCOPY;  Service: Endoscopy;  Laterality: N/A;   COLONOSCOPY WITH PROPOFOL N/A 11/08/2017   Procedure: COLONOSCOPY WITH PROPOFOL;  Surgeon: Manya Silvas, MD;  Location: [facility] ENDOSCOPY;  Service: Endoscopy;  Laterality: N/A;   COLONOSCOPY WITH PROPOFOL N/A 06/30/2021   Procedure: COLONOSCOPY WITH PROPOFOL;  Surgeon: Lesly Rubenstein, MD;  Location: [facility] ENDOSCOPY;  Service: Endoscopy;  Laterality: N/A;   CYSTOCELE REPAIR N/A 04/09/2016   Procedure: ANTERIOR REPAIR (CYSTOCELE);  Surgeon: Gae Dry, MD;  Location: [facility] ORS;  Service: Gynecology;  Laterality: N/A;   DIAGNOSTIC LAPAROSCOPY  2008   removed both ovaries with cysts, tubes and fibroids   DILATION AND CURETTAGE OF UTERUS  1973   ESOPHAGOGASTRODUODENOSCOPY (  EGD) WITH PROPOFOL N/A 08/23/2017   Procedure: ESOPHAGOGASTRODUODENOSCOPY (EGD) WITH PROPOFOL;  Surgeon: Manya Silvas, MD;  Location[facility] END[facility]COPY;  Service: Endoscopy;  Laterality: N/A;   ESOPHAGOGASTRODUODENOSCOPY (EGD) WITH PROPOFOL N/A 06/30/2021   Procedure: ESOPHAGOGASTRODUODENOSCOPY (EGD) WITH PROPOFOL;  Surgeon: Lesly Rubenstein, MD;  Location: [facility] ENDOSCOPY;  Service: Endoscopy;  Laterality: N/A;   EYE SURGERY Bilateral 2016   HERNIA REPAIR Right 2008   Inguinal hernia Repair, ventral hernia repair   JOINT REPLACEMENT Right 2008   knee   KNEE ARTHROSCOPY Right    LUMBAR LAMINECTOMY/DECOMPRESSION MICRODISCECTOMY Right 03/21/2013   Procedure: Right Lumbar five-Sacral one Laminectomy for Synovial Cyst;  Surgeon: Faythe Ghee, MD;  Location: [facility] NEURO ORS;  Service: Neurosurgery;  Laterality: Right;  right   OOPHORECTOMY     REVERSE SHOULDER ARTHROPLASTY Right 02/22/2018   Procedure: REVERSE SHOULDER ARTHROPLASTY;  Surgeon: Corky Mull, MD;  Location: [facility] ORS;  Service: Orthopedics;  Laterality:  Right;   TUBAL LIGATION     UNILATERAL SALPINGECTOMY Left 04/09/2016   Procedure: UNILATERAL SALPINGECTOMY;  Surgeon: Gae Dry, MD;  Location: [facility] ORS;  Service: Gynecology;  Laterality: Left;   UPPER GI ENDOSCOPY  2010   with biopsy of gastric erosion   VAGINAL HYSTERECTOMY N/A 04/09/2016   Procedure: HYSTERECTOMY VAGINAL;  Surgeon: Gae Dry, MD;  Location: [facility] ORS;  Service: Gynecology;  Laterality: N/A;   VAGINAL HYSTERECTOMY  2017   and bladder tack    Medical History: Past Medical History:  Diagnosis Date   Arthritis    osteo   Asthma    needs rescue inhaler few times a year   Claustrophobia 04/21/2020   Depression    Dizziness    light headed spells but it has been a while   Dysrhythmia    tachycardia.(cardizem). brady during procedures   Fibromyalgia    GERD (gastroesophageal reflux disease)    gastritis   Headache(784.0)    Heart murmur 2019   aortic. dr. Nehemiah Massed not worried about this   Hyperlipidemia    Hypertension    Hypothyroidism    Macular degeneration    Migraine    Orthopnea    Pneumonia    long time ago (greater than 5 years ago)   PONV (postoperative nausea and vomiting)    very anxious during cataract surgery. brady during colonoscopy   Shortness of breath    any exertion, cannot lie flat    Family History: Family History  Problem Relation Age of Onset   Pulmonary fibrosis Mother    Melanoma Father    COPD Sister    Cardiomyopathy Sister        and arrhythmia   Atrial fibrillation Sister    COPD Brother    Pulmonary fibrosis Brother    Cancer Brother    Breast cancer Cousin        paternal 1st    Social History   Socioeconomic History   Marital status: Married    Spouse name: Not on file   Number of children: Not on file   Years of education: Not on file   Highest education level: Not on file  Occupational History   Not on file  Tobacco Use   Smoking status: Former    Types: Cigarettes    Quit date: 1967     Years since quitting: 55.9   Smokeless tobacco: Never  Vaping Use   Vaping Use: Never used  Substance and Sexual Activity   Alcohol use: Not  Currently   Drug use: No   Sexual activity: Not Currently  Other Topics Concern   Not on file  Social History Narrative   Not on file   Social Determinants of Health   Financial Resource Strain: Not on file  Food Insecurity: Not on file  Transportation Needs: Not on file  Physical Activity: Not on file  Stress: Not on file  Social Connections: Not on file  Intimate Partner Violence: Not on file      Review of Systems  Constitutional:  Positive for fatigue. Negative for fever.  HENT:  Positive for congestion, postnasal drip and sinus pressure. Negative for mouth sores.   Respiratory:  Positive for cough. Negative for shortness of breath and wheezing.   Cardiovascular:  Negative for chest pain.  Genitourinary:  Negative for flank pain.  Psychiatric/Behavioral: Negative.     Vital Signs: There were no vitals taken for this visit.   Observation/Objective:  Pt is able to carry out conversation   Assessment/Plan: 1. Flu-like symptoms - POCT Influenza A/B is negative, advised to stay well hydrated and rest and take tylenol as needed  2. Acute non-recurrent maxillary sinusitis Will start augmentin BID with food and continue nasal spray and mucinex - amoxicillin-clavulanate (AUGMENTIN) 875-125 MG tablet; Take 1 tablet by mouth 2 (two) times daily. Take with food. (Patient not taking: Reported on 06/30/2021)  Dispense: 20 tablet; Refill: 0  3. Mild intermittent asthma without complication May continue inhalers as needed and as prescribed - albuterol (VENTOLIN HFA) 108 (90 Base) MCG/ACT inhaler; Inhale 2 puffs into the lungs every 4 (four) hours as needed for wheezing or shortness of breath.  Dispense: 18 g; Refill: 5  4. Allergic rhinitis due to pollen, unspecified seasonality Requests refill on allergy medication - levocetirizine  (XYZAL) 5 MG tablet; Take 1 tablet (5 mg total) by mouth every evening.  Dispense: 90 tablet; Refill: 1  5. Mixed hyperlipidemia Requests refill - fenofibrate 160 MG tablet; TAKE 1 TABLET DAILY FOR CHOLESTEROL  Dispense: 90 tablet; Refill: 1   General Counseling: Vanessa Evans verbalizes understanding of the findings of today's phone visit and agrees with plan of treatment. I have discussed any further diagnostic evaluation that may be needed or ordered today. We also reviewed her medications today. she has been encouraged to call the office with any questions or concerns that should arise related to todays visit.    Orders Placed This Encounter  Procedures   POCT Influenza A/B    Meds ordered this encounter  Medications   amoxicillin-clavulanate (AUGMENTIN) 875-125 MG tablet    Sig: Take 1 tablet by mouth 2 (two) times daily. Take with food.    Dispense:  20 tablet    Refill:  0   albuterol (VENTOLIN HFA) 108 (90 Base) MCG/ACT inhaler    Sig: Inhale 2 puffs into the lungs every 4 (four) hours as needed for wheezing or shortness of breath.    Dispense:  18 g    Refill:  5   levocetirizine (XYZAL) 5 MG tablet    Sig: Take 1 tablet (5 mg total) by mouth every evening.    Dispense:  90 tablet    Refill:  1   fenofibrate 160 MG tablet    Sig: TAKE 1 TABLET DAILY FOR CHOLESTEROL    Dispense:  90 tablet    Refill:  1    Time spent:30 Minutes    Dr Lavera Guise Internal medicine

## 2021-06-30 ENCOUNTER — Ambulatory Visit
Admission: RE | Admit: 2021-06-30 | Discharge: 2021-06-30 | Disposition: A | Discharge status: Home / Self Care | Payer: Medicare Other | Source: Ambulatory Visit | Attending: Gastroenterology | Admitting: Gastroenterology

## 2021-06-30 ENCOUNTER — Other Ambulatory Visit: Payer: Self-pay

## 2021-06-30 ENCOUNTER — Encounter
Admission: RE | Disposition: A | Discharge status: Home / Self Care | Payer: Self-pay | Source: Ambulatory Visit | Attending: Gastroenterology

## 2021-06-30 ENCOUNTER — Ambulatory Visit: Payer: Medicare Other | Admitting: Certified Registered Nurse Anesthetist

## 2021-06-30 DIAGNOSIS — R1310 Dysphagia, unspecified: Secondary | ICD-10-CM | POA: Diagnosis not present

## 2021-06-30 DIAGNOSIS — K635 Polyp of colon: Secondary | ICD-10-CM | POA: Diagnosis not present

## 2021-06-30 DIAGNOSIS — K640 First degree hemorrhoids: Secondary | ICD-10-CM | POA: Diagnosis not present

## 2021-06-30 DIAGNOSIS — R1013 Epigastric pain: Secondary | ICD-10-CM | POA: Diagnosis not present

## 2021-06-30 DIAGNOSIS — Z1211 Encounter for screening for malignant neoplasm of colon: Secondary | ICD-10-CM | POA: Diagnosis not present

## 2021-06-30 DIAGNOSIS — D123 Benign neoplasm of transverse colon: Secondary | ICD-10-CM | POA: Insufficient documentation

## 2021-06-30 DIAGNOSIS — Z86010 Personal history of colonic polyps: Secondary | ICD-10-CM | POA: Diagnosis not present

## 2021-06-30 DIAGNOSIS — K5900 Constipation, unspecified: Secondary | ICD-10-CM | POA: Diagnosis not present

## 2021-06-30 DIAGNOSIS — K5730 Diverticulosis of large intestine without perforation or abscess without bleeding: Secondary | ICD-10-CM | POA: Insufficient documentation

## 2021-06-30 DIAGNOSIS — I25119 Atherosclerotic heart disease of native coronary artery with unspecified angina pectoris: Secondary | ICD-10-CM | POA: Diagnosis not present

## 2021-06-30 DIAGNOSIS — D122 Benign neoplasm of ascending colon: Secondary | ICD-10-CM | POA: Insufficient documentation

## 2021-06-30 DIAGNOSIS — Z87891 Personal history of nicotine dependence: Secondary | ICD-10-CM | POA: Diagnosis not present

## 2021-06-30 DIAGNOSIS — E782 Mixed hyperlipidemia: Secondary | ICD-10-CM | POA: Diagnosis not present

## 2021-06-30 DIAGNOSIS — K219 Gastro-esophageal reflux disease without esophagitis: Secondary | ICD-10-CM | POA: Diagnosis not present

## 2021-06-30 DIAGNOSIS — K649 Unspecified hemorrhoids: Secondary | ICD-10-CM | POA: Diagnosis not present

## 2021-06-30 DIAGNOSIS — K5790 Diverticulosis of intestine, part unspecified, without perforation or abscess without bleeding: Secondary | ICD-10-CM | POA: Diagnosis not present

## 2021-06-30 HISTORY — PX: COLONOSCOPY WITH PROPOFOL: SHX5780

## 2021-06-30 HISTORY — PX: ESOPHAGOGASTRODUODENOSCOPY (EGD) WITH PROPOFOL: SHX5813

## 2021-06-30 SURGERY — ESOPHAGOGASTRODUODENOSCOPY (EGD) WITH PROPOFOL
Anesthesia: General

## 2021-06-30 MED ORDER — GLYCOPYRROLATE 0.2 MG/ML IJ SOLN
INTRAMUSCULAR | Status: AC
  Filled 2021-06-30: qty 1

## 2021-06-30 MED ORDER — EPHEDRINE SULFATE 50 MG/ML IJ SOLN
INTRAMUSCULAR | Status: DC | PRN
  Administered 2021-06-30: 5 mg via INTRAVENOUS

## 2021-06-30 MED ORDER — LIDOCAINE HCL (CARDIAC) PF 100 MG/5ML IV SOSY
PREFILLED_SYRINGE | INTRAVENOUS | Status: DC | PRN
  Administered 2021-06-30: 50 mg via INTRAVENOUS

## 2021-06-30 MED ORDER — PROPOFOL 500 MG/50ML IV EMUL
INTRAVENOUS | Status: DC | PRN
  Administered 2021-06-30: 150 ug/kg/min via INTRAVENOUS

## 2021-06-30 MED ORDER — LIDOCAINE HCL (PF) 2 % IJ SOLN
INTRAMUSCULAR | Status: AC
  Filled 2021-06-30: qty 5

## 2021-06-30 MED ORDER — SODIUM CHLORIDE 0.9 % IV SOLN: INTRAVENOUS | Status: DC

## 2021-06-30 MED ORDER — PROPOFOL 10 MG/ML IV BOLUS
INTRAVENOUS | Status: DC | PRN
  Administered 2021-06-30: 10 mg via INTRAVENOUS
  Administered 2021-06-30: 50 mg via INTRAVENOUS
  Administered 2021-06-30: 10 mg via INTRAVENOUS

## 2021-06-30 MED ORDER — PROPOFOL 500 MG/50ML IV EMUL
INTRAVENOUS | Status: AC
  Filled 2021-06-30: qty 50

## 2021-06-30 MED ORDER — GLYCOPYRROLATE 0.2 MG/ML IJ SOLN
INTRAMUSCULAR | Status: DC | PRN
  Administered 2021-06-30 (×2): .1 mg via INTRAVENOUS

## 2021-06-30 NOTE — Op Note (Signed)
[facility] Gastroenterology Patient Name: Vanessa Evans Procedure Date: 06/30/2021 7:22 AM MRN: [id number] Account #: [id number] Date of Birth: 10-Aug-1946 Admit Type: Outpatient Age: 75 Room: [facility] ENDO ROOM 3 Gender: Female Note Status: Finalized Instrument Name: Upper Endoscope [id number] Procedure:             Upper GI endoscopy Indications:           Dyspepsia, Dysphagia Providers:             Andrey Farmer MD, MD Medicines:             Monitored Anesthesia Care Complications:         No immediate complications. Estimated blood loss:                         Minimal. Procedure:             Pre-Anesthesia Assessment:                        - Prior to the procedure, a History and Physical was                         performed, and patient medications and allergies were                         reviewed. The patient is competent. The risks and                         benefits of the procedure and the sedation options and                         risks were discussed with the patient. All questions                         were answered and informed consent was obtained.                         Patient identification and proposed procedure were                         verified by the physician, the nurse, the anesthetist                         and the technician in the endoscopy suite. Mental                         Status Examination: alert and oriented. Airway                         Examination: normal oropharyngeal airway and neck                         mobility. Respiratory Examination: clear to                         auscultation. CV Examination: normal. Prophylactic                         Antibiotics: The patient does not require prophylactic  antibiotics. Prior Anticoagulants: The patient has                         taken no previous anticoagulant or antiplatelet                         agents. ASA Grade Assessment: II - A patient  with mild                         systemic disease. After reviewing the risks and                         benefits, the patient was deemed in satisfactory                         condition to undergo the procedure. The anesthesia                         plan was to use monitored anesthesia care (MAC).                         Immediately prior to administration of medications,                         the patient was re-assessed for adequacy to receive                         sedatives. The heart rate, respiratory rate, oxygen                         saturations, blood pressure, adequacy of pulmonary                         ventilation, and response to care were monitored                         throughout the procedure. The physical status of the                         patient was re-assessed after the procedure.                        After obtaining informed consent, the endoscope was                         passed under direct vision. Throughout the procedure,                         the patient's blood pressure, pulse, and oxygen                         saturations were monitored continuously. The Endoscope                         was introduced through the mouth, and advanced to the                         second part of duodenum. The upper GI endoscopy was  accomplished without difficulty. The patient tolerated                         the procedure well. Findings:      No endoscopic abnormality was evident in the esophagus to explain the       patient's complaint of dysphagia. It was decided, however, to proceed       with dilation of the lower third of the esophagus. A TTS dilator was       passed through the scope. Dilation with an 18-19-20 mm balloon dilator       was performed to 18 mm. The dilation site was examined and showed no       change. Biopsies were obtained from the proximal and distal esophagus       with cold forceps for histology of suspected  eosinophilic esophagitis.       Estimated blood loss was minimal.      The entire examined stomach was normal. Biopsies were taken with a cold       forceps for histology. Estimated blood loss was minimal.      The examined duodenum was normal. Impression:            - No endoscopic esophageal abnormality to explain                         patient's dysphagia. Esophagus dilated. Dilated.                         Biopsied.                        - Normal stomach. Biopsied.                        - Normal examined duodenum. Recommendation:        - Await pathology results.                        - Perform a colonoscopy today. Procedure Code(s):     --- Professional ---                        [phone], Esophagogastroduodenoscopy, flexible,                         transoral; with transendoscopic balloon dilation of                         esophagus (less than 30 mm diameter) Diagnosis Code(s):     --- Professional ---                        R13.10, Dysphagia, unspecified                        R10.13, Epigastric pain CPT copyright 2019 American Medical Association. All rights reserved. The codes documented in this report are preliminary and upon coder review may  be revised to meet current compliance requirements. Andrey Farmer MD, MD 06/30/2021 8:27:30 AM Number of Addenda: 0 Note Initiated On: 06/30/2021 7:22 AM Estimated Blood Loss:  Estimated blood loss was minimal.      [facility]

## 2021-06-30 NOTE — Op Note (Signed)
[facility] Gastroenterology Patient Name: Vanessa Evans Procedure Date: 06/30/2021 7:22 AM MRN: [id number] Account #: [id number] Date of Birth: 1946/05/03 Admit Type: Outpatient Age: 75 Room: [facility] ENDO ROOM 3 Gender: Female Note Status: Finalized Instrument Name: Peds Colonoscope [id number] Procedure:             Colonoscopy Indications:           Constipation Providers:             Andrey Farmer MD, MD Medicines:             Monitored Anesthesia Care Complications:         No immediate complications. Estimated blood loss:                         Minimal. Procedure:             Pre-Anesthesia Assessment:                        - Prior to the procedure, a History and Physical was                         performed, and patient medications and allergies were                         reviewed. The patient is competent. The risks and                         benefits of the procedure and the sedation options and                         risks were discussed with the patient. All questions                         were answered and informed consent was obtained.                         Patient identification and proposed procedure were                         verified by the physician, the nurse, the anesthetist                         and the technician in the endoscopy suite. Mental                         Status Examination: alert and oriented. Airway                         Examination: normal oropharyngeal airway and neck                         mobility. Respiratory Examination: clear to                         auscultation. CV Examination: normal. Prophylactic                         Antibiotics: The patient does not require prophylactic  antibiotics. Prior Anticoagulants: The patient has                         taken no previous anticoagulant or antiplatelet                         agents. ASA Grade Assessment: II - A patient with mild                          systemic disease. After reviewing the risks and                         benefits, the patient was deemed in satisfactory                         condition to undergo the procedure. The anesthesia                         plan was to use monitored anesthesia care (MAC).                         Immediately prior to administration of medications,                         the patient was re-assessed for adequacy to receive                         sedatives. The heart rate, respiratory rate, oxygen                         saturations, blood pressure, adequacy of pulmonary                         ventilation, and response to care were monitored                         throughout the procedure. The physical status of the                         patient was re-assessed after the procedure.                        After obtaining informed consent, the colonoscope was                         passed under direct vision. Throughout the procedure,                         the patient's blood pressure, pulse, and oxygen                         saturations were monitored continuously. The                         Colonoscope was introduced through the anus and                         advanced to the the cecum, identified by appendiceal  orifice and ileocecal valve. The colonoscopy was                         somewhat difficult due to a tortuous colon. Successful                         completion of the procedure was aided by applying                         abdominal pressure. The patient tolerated the                         procedure well. The quality of the bowel preparation                         was good. Findings:      The perianal and digital rectal examinations were normal.      A 2 mm polyp was found in the ascending colon. The polyp was sessile.       The polyp was removed with a jumbo cold forceps. Resection and retrieval       were complete. Estimated  blood loss was minimal.      A 2 mm polyp was found in the transverse colon. The polyp was sessile.       The polyp was removed with a jumbo cold forceps. Resection and retrieval       were complete. Estimated blood loss was minimal.      Multiple small and large-mouthed diverticula were found in the sigmoid       colon and descending colon.      Internal hemorrhoids were found during retroflexion. The hemorrhoids       were Grade I (internal hemorrhoids that do not prolapse).      The exam was otherwise without abnormality on direct and retroflexion       views. Impression:            - One 2 mm polyp in the ascending colon, removed with                         a jumbo cold forceps. Resected and retrieved.                        - One 2 mm polyp in the transverse colon, removed with                         a jumbo cold forceps. Resected and retrieved.                        - Diverticulosis in the sigmoid colon and in the                         descending colon.                        - Internal hemorrhoids.                        - The examination was otherwise normal on direct and  retroflexion views. Recommendation:        - Discharge patient to home.                        - Resume previous diet.                        - Continue present medications.                        - Await pathology results.                        - Repeat colonoscopy is not recommended due to current                         age (38 years or older) for surveillance.                        - Return to referring physician as previously                         scheduled. Procedure Code(s):     --- Professional ---                        [phone], Colonoscopy, flexible; with biopsy, single or                         multiple Diagnosis Code(s):     --- Professional ---                        K63.5, Polyp of colon                        K64.0, First degree hemorrhoids                         K59.00, Constipation, unspecified                        K57.30, Diverticulosis of large intestine without                         perforation or abscess without bleeding CPT copyright 2019 American Medical Association. All rights reserved. The codes documented in this report are preliminary and upon coder review may  be revised to meet current compliance requirements. Andrey Farmer MD, MD 06/30/2021 8:30:49 AM Number of Addenda: 0 Note Initiated On: 06/30/2021 7:22 AM Scope Withdrawal Time: 0 hours 9 minutes 28 seconds  Total Procedure Duration: 0 hours 18 minutes 44 seconds  Estimated Blood Loss:  Estimated blood loss was minimal.      [facility]

## 2021-06-30 NOTE — H&P (Signed)
Outpatient short stay form Pre-procedure 06/30/2021  Vanessa Rubenstein, MD  Primary Physician: Lavera Guise, MD  Reason for visit:  GERD/Nausea/Constipation  History of present illness:   75 y/o lady with history of hypothyroidism here for dyspeptic symptoms and new onset severe constipation. Patient with history of difficult colonoscopy due to vasovagal reflex. History of hysterectomy. No blood thinners. No family history of GI malignancies. Mild dysphagia to solids but no liquids.    Current Facility-Administered Medications:    0.9 %  sodium chloride infusion, , Intravenous, Continuous, Ayaka Andes, Hilton Cork, MD, Last Rate: 20 mL/hr at 06/30/21 0741, New Bag at 06/30/21 0741  Facility-Administered Medications Prior to Admission  Medication Dose Route Frequency Provider Last Rate Last Admin   betamethasone acetate-betamethasone sodium phosphate (CELESTONE) injection 3 mg  3 mg Intra-articular Once Daylene Katayama M, DPM       betamethasone acetate-betamethasone sodium phosphate (CELESTONE) injection 3 mg  3 mg Intra-articular Once Edrick Kins, DPM       Medications Prior to Admission  Medication Sig Dispense Refill Last Dose   acetaminophen (TYLENOL) 500 MG tablet Take 1,000 mg by mouth 2 (two) times daily as needed for moderate pain.   06/30/2021 at 0530   albuterol (VENTOLIN HFA) 108 (90 Base) MCG/ACT inhaler Inhale 2 puffs into the lungs every 4 (four) hours as needed for wheezing or shortness of breath. 18 g 5 06/30/2021   BLACK ELDERBERRY PO Take 1 Dose by mouth 2 (two) times a day.   06/29/2021   Calcium Carbonate-Vitamin D 600-400 MG-UNIT tablet Take 1 tablet by mouth 2 (two) times daily.   Past Week   cholecalciferol (VITAMIN D) 1000 UNITS tablet Take 1,000 Units by mouth at bedtime.    Past Week   citalopram (CELEXA) 10 MG tablet Take 1 tablet (10 mg total) by mouth daily. 90 tablet 1 06/29/2021   cyanocobalamin 500 MCG tablet Take 500 mcg by mouth daily.   Past Week    diltiazem (CARDIZEM CD) 120 MG 24 hr capsule Take 120 mg by mouth at bedtime.   3 Past Week   docusate sodium (COLACE) 100 MG capsule Take 200 mg by mouth at bedtime as needed for moderate constipation.    Past Week   fenofibrate 160 MG tablet TAKE 1 TABLET DAILY FOR CHOLESTEROL 90 tablet 1 60/73/7106   folic acid (FOLVITE) 269 MCG tablet Take 400 mcg by mouth daily.   Past Week   guaiFENesin (MUCINEX) 600 MG 12 hr tablet Take 600 mg by mouth at bedtime as needed (congestion).   06/30/2021 at 0530   levothyroxine (SYNTHROID, LEVOTHROID) 50 MCG tablet Take 50 mcg by mouth daily before breakfast. Brand Name only   06/30/2021 at 0300   Magnesium 250 MG TABS Take 1 tablet by mouth daily.   Past Week   Multiple Vitamins-Minerals (MULTIVITAMIN WITH MINERALS) tablet Take 1 tablet by mouth daily.   Past Week   OXYGEN Inhale into the lungs. 2 litre at night   06/29/2021   pantoprazole (PROTONIX) 40 MG tablet Take 1 tablet (40 mg total) by mouth 2 (two) times daily. 180 tablet 1 Past Week   pyridoxine (B-6) 100 MG tablet Take 100 mg by mouth daily.   Past Week   sucralfate (CARAFATE) 1 g tablet Take 3 times daily with meals. 90 tablet 2 Past Week   Vitamin A 7.5 MG (25000 UT) CAPS Take 1 tablet by mouth 2 (two) times daily.   Past Week   zinc  gluconate 50 MG tablet Take 50 mg by mouth daily.   Past Week   albuterol (PROVENTIL) (2.5 MG/3ML) 0.083% nebulizer solution Take 3 mLs (2.5 mg total) by nebulization every 6 (six) hours as needed for wheezing or shortness of breath. 75 mL 3    amoxicillin-clavulanate (AUGMENTIN) 875-125 MG tablet Take 1 tablet by mouth 2 (two) times daily. Take with food. (Patient not taking: Reported on 06/30/2021) 20 tablet 0 Not Taking   aspirin EC 81 MG tablet Take 81 mg by mouth at bedtime.    06/24/2021   benzonatate (TESSALON) 100 MG capsule Take 2 capsules (200 mg total) by mouth 2 (two) times daily as needed for cough. 60 capsule 3    Dextran 70-Hypromellose (ARTIFICIAL TEARS)  0.1-0.3 % SOLN Apply 1-2 drops to eye daily.      diclofenac Sodium (VOLTAREN) 1 % GEL Apply 4 g topically 4 (four) times daily. 500 g 3    diphenhydrAMINE (BENADRYL) 25 MG tablet Take 25-50 mg by mouth daily as needed for itching.      EPINEPHrine (EPIPEN 2-PAK) 0.3 mg/0.3 mL IJ SOAJ injection use as directed for severe allergy reaction 2 each 1    fluticasone (FLONASE) 50 MCG/ACT nasal spray Use 2 sprays in each nostril daily, 16 g 2    fluticasone (FLONASE) 50 MCG/ACT nasal spray Place into the nose.      Hypromellose (ARTIFICIAL TEARS OP) Place 1-2 drops into both eyes daily as needed (dry eyes).      ketotifen (ZADITOR) 0.025 % ophthalmic solution Place 1 drop into both eyes 2 (two) times daily as needed (allergies).      levocetirizine (XYZAL) 5 MG tablet Take 1 tablet (5 mg total) by mouth every evening. 90 tablet 1    Melatonin 5 MG TABS Take 1 tablet by mouth at bedtime. For sleep      sodium chloride (OCEAN) 0.65 % SOLN nasal spray Place 1 spray into both nostrils 2 (two) times daily.        Allergies  Allergen Reactions   Codeine Shortness Of Breath and Itching    Bronchospasms and asthma attach   Levofloxacin     Altered mental status": drunk" feeling, confused, speech problems Other reaction(s): Unknown    Oxycodone Itching and Shortness Of Breath    Would take benadryl to eliminate itching. Has had bronchospasm   Ultram [Tramadol] Itching and Other (See Comments)    Bronchospasm and asthma attack   Nitrofurantoin Diarrhea    Tried again and had no problems with it Severe and long lasting   Nsaids Other (See Comments)    Bloody stools, abdominal pain History of gastritis   Sulfacetamide Rash    Fever, abdominal pain   Adhesive [Tape] Other (See Comments)    Thin skin. Causes tears. Paper tape okay   Azithromycin     Unsure. Patient does not remember but thinks that it did not work for her.   Ciprofloxacin Other (See Comments)    Severe pain in neck and down back     Ketorolac Itching   Sulfa Antibiotics Itching, Rash and Other (See Comments)    fever   Tolmetin     Other reaction(s): Other (See Comments) Bloody stools, abdominal pain History of chronic gastritis   Zofran [Ondansetron Hcl] Other (See Comments)    constipation     Past Medical History:  Diagnosis Date   Arthritis    osteo   Asthma    needs rescue inhaler few times  a year   Claustrophobia 04/21/2020   Depression    Dizziness    light headed spells but it has been a while   Dysrhythmia    tachycardia.(cardizem). brady during procedures   Fibromyalgia    GERD (gastroesophageal reflux disease)    gastritis   Headache(784.0)    Heart murmur 2019   aortic. dr. Nehemiah Massed not worried about this   Hyperlipidemia    Hypertension    Hypothyroidism    Macular degeneration    Migraine    Orthopnea    Pneumonia    long time ago (greater than 5 years ago)   PONV (postoperative nausea and vomiting)    very anxious during cataract surgery. brady during colonoscopy   Shortness of breath    any exertion, cannot lie flat    Review of systems:  Otherwise negative.    Physical Exam  Gen: Alert, oriented. Appears stated age.  HEENT: PERRLA. Lungs: No respiratory distress CV: RRR Abd: soft, benign, no masses Ext: No edema    Planned procedures: Proceed with EGD/colonoscopy. The patient understands the nature of the planned procedure, indications, risks, alternatives and potential complications including but not limited to bleeding, infection, perforation, damage to internal organs and possible oversedation/side effects from anesthesia. The patient agrees and gives consent to proceed.  Please refer to procedure notes for findings, recommendations and patient disposition/instructions.     Vanessa Rubenstein, MD [facility] Gastroenterology

## 2021-06-30 NOTE — Interval H&P Note (Signed)
History and Physical Interval Note:  06/30/2021 7:43 AM  Vanessa Evans  has presented today for surgery, with the diagnosis of GERD & H/O POlyps.  The various methods of treatment have been discussed with the patient and family. After consideration of risks, benefits and other options for treatment, the patient has consented to  Procedure(s): ESOPHAGOGASTRODUODENOSCOPY (EGD) WITH PROPOFOL (N/A) COLONOSCOPY WITH PROPOFOL (N/A) as a surgical intervention.  The patient's history has been reviewed, patient examined, no change in status, stable for surgery.  I have reviewed the patient's chart and labs.  Questions were answered to the patient's satisfaction.     Lesly Rubenstein  Ok to proceed with EGD/Colonoscopy

## 2021-06-30 NOTE — Anesthesia Preprocedure Evaluation (Signed)
Anesthesia Evaluation  Patient identified by MRN, date of birth, ID band Patient awake    Reviewed: Allergy & Precautions, H&P , NPO status , Patient's Chart, lab work & pertinent test results  History of Anesthesia Complications (+) PONV and history of anesthetic complications (Bradycardia with last endo, Rx Robinul)  Airway Mallampati: II  TM Distance: >3 FB Neck ROM: Full    Dental  (+) Poor Dentition   Pulmonary shortness of breath, with exertion and Long-Term Oxygen Therapy, asthma , pneumonia, resolved, former smoker,    Pulmonary exam normal        Cardiovascular hypertension, Pt. on medications + angina with exertion + CAD and + Orthopnea  Normal cardiovascular exam+ dysrhythmias + Valvular Problems/Murmurs      Neuro/Psych  Headaches, PSYCHIATRIC DISORDERS Anxiety Depression  Neuromuscular disease    GI/Hepatic Neg liver ROS, PUD, Bowel prep,GERD  Medicated,  Endo/Other  Hypothyroidism   Renal/GU negative Renal ROS  negative genitourinary   Musculoskeletal  (+) Arthritis , Fibromyalgia -  Abdominal   Peds negative pediatric ROS (+)  Hematology negative hematology ROS (+) anemia ,   Anesthesia Other Findings . Abdominal pain, epigastric 05/24/2017  . Allergic rhinitis  . Asthma  . Cataract  . Chest pain of uncertain etiology  . Chronic superficial gastritis without bleeding 12/12/2017  . COVID  . COVID-19 09/2020  . Depression  . Eczema, unspecified  . Gastritis  . Gastroesophageal reflux disease without esophagitis 05/24/2017  . Heart murmur, unspecified  . Hematuria  bladder biopsies for hematuria, negative malignancy  . HTN (hypertension), benign  . Hx of adenomatous colonic polyps 2002  colon polyposis  . Hyperlipidemia  . Hyperplastic colon polyp 2005; 2010  adenomatous in 2010  . Hypothyroidism  . Macular degeneration 08/2020  right eye  . Nausea 10/18/2017  . Osteoarthritis  ([place]) a.  Knees b. Lumbar spine c. Hands d. Avascular necrosis of the left lunate.  . Osteoporosis, post-menopausal  . Paroxysmal supraventricular tachycardia (CMS-HCC)  . PVC (premature ventricular contraction)  . Schatzki's ring 12/12/2017  . SOB (shortness of breath) on exertion  . Tachycardia  . Uterine fibroid  . Weakness generalized 10/18/2017   10/30/20  LVEF= 77%  FINDINGS:  Regional wall motion: reveals normal myocardial thickening and wall  motion.  The overall quality of the study is good.   Artifacts noted: no  Left ventricular cavity: normal.   Perfusion Analysis: SPECT images demonstrate homogeneous tracer  distribution throughout the myocardium. Defect type : Normal  Exam End: 10/30/20 12:45 Last Resulted: 10/31/20 17:29 Received From: [place]  Result Received: 11/15/20 11:18    Reproductive/Obstetrics negative OB ROS                            Anesthesia Physical Anesthesia Plan  ASA: 3  Anesthesia Plan: General   Post-op Pain Management:    Induction: Intravenous  PONV Risk Score and Plan: 2 and TIVA and Propofol infusion  Airway Management Planned: Natural Airway and Nasal Cannula  Additional Equipment:   Intra-op Plan:   Post-operative Plan:   Informed Consent: I have reviewed the patients History and Physical, chart, labs and discussed the procedure including the risks, benefits and alternatives for the proposed anesthesia with the patient or authorized representative who has indicated his/her understanding and acceptance.     Dental advisory given  Plan Discussed with: CRNA, Anesthesiologist and Surgeon  Anesthesia Plan Comments:  Anesthesia Quick Evaluation  

## 2021-06-30 NOTE — Anesthesia Postprocedure Evaluation (Signed)
Anesthesia Post Note  Patient: TAHISHA HAKIM  Procedure(s) Performed: ESOPHAGOGASTRODUODENOSCOPY (EGD) WITH PROPOFOL COLONOSCOPY WITH PROPOFOL  Patient location during evaluation: Phase II Anesthesia Type: General Level of consciousness: awake and alert, awake and oriented Pain management: pain level controlled Vital Signs Assessment: post-procedure vital signs reviewed and stable Respiratory status: spontaneous breathing, nonlabored ventilation and respiratory function stable Cardiovascular status: blood pressure returned to baseline and stable Postop Assessment: no apparent nausea or vomiting Anesthetic complications: no   No notable events documented.   Last Vitals:  Vitals:   06/30/21 0723 06/30/21 0825  BP: (!) 167/78 (!) 108/54  Pulse: 90 76  Resp: 16 18  Temp: (!) 36.4 C 36.5 C  SpO2: 97% 92%    Last Pain:  Vitals:   06/30/21 0845  TempSrc:   PainSc: 0-No pain                 Phill Mutter

## 2021-06-30 NOTE — Transfer of Care (Signed)
Immediate Anesthesia Transfer of Care Note  Patient: Vanessa Evans  Procedure(s) Performed: ESOPHAGOGASTRODUODENOSCOPY (EGD) WITH PROPOFOL COLONOSCOPY WITH PROPOFOL  Patient Location: Endoscopy Unit  Anesthesia Type:General  Level of Consciousness: drowsy  Airway & Oxygen Therapy: Patient Spontanous Breathing  Post-op Assessment: Report given to RN and Post -op Vital signs reviewed and stable  Post vital signs: Reviewed and stable  Last Vitals:  Vitals Value Taken Time  BP 108/54 06/30/21 0826  Temp 36.5 C 06/30/21 0825  Pulse 78 06/30/21 0826  Resp 18 06/30/21 0826  SpO2 92 % 06/30/21 0826  Vitals shown include unvalidated device data.  Last Pain:  Vitals:   06/30/21 0825  TempSrc: Temporal  PainSc: Asleep         Complications: No notable events documented.

## 2021-07-01 ENCOUNTER — Encounter: Payer: Self-pay | Admitting: Gastroenterology

## 2021-07-01 LAB — SURGICAL PATHOLOGY

## 2021-07-07 DIAGNOSIS — H353211 Exudative age-related macular degeneration, right eye, with active choroidal neovascularization: Secondary | ICD-10-CM | POA: Diagnosis not present

## 2021-07-15 DIAGNOSIS — I10 Essential (primary) hypertension: Secondary | ICD-10-CM | POA: Diagnosis not present

## 2021-07-15 DIAGNOSIS — R002 Palpitations: Secondary | ICD-10-CM | POA: Diagnosis not present

## 2021-07-15 DIAGNOSIS — I471 Supraventricular tachycardia: Secondary | ICD-10-CM | POA: Diagnosis not present

## 2021-07-15 DIAGNOSIS — I493 Ventricular premature depolarization: Secondary | ICD-10-CM | POA: Diagnosis not present

## 2021-07-23 DIAGNOSIS — R768 Other specified abnormal immunological findings in serum: Secondary | ICD-10-CM | POA: Diagnosis not present

## 2021-07-23 DIAGNOSIS — M159 Polyosteoarthritis, unspecified: Secondary | ICD-10-CM | POA: Diagnosis not present

## 2021-07-23 DIAGNOSIS — M5136 Other intervertebral disc degeneration, lumbar region: Secondary | ICD-10-CM | POA: Diagnosis not present

## 2021-07-25 ENCOUNTER — Ambulatory Visit (INDEPENDENT_AMBULATORY_CARE_PROVIDER_SITE_OTHER): Payer: Medicare Other

## 2021-07-25 ENCOUNTER — Other Ambulatory Visit: Payer: Self-pay

## 2021-07-25 ENCOUNTER — Ambulatory Visit (INDEPENDENT_AMBULATORY_CARE_PROVIDER_SITE_OTHER): Payer: Medicare Other | Admitting: Podiatry

## 2021-07-25 ENCOUNTER — Encounter: Payer: Self-pay | Admitting: Podiatry

## 2021-07-25 DIAGNOSIS — M7672 Peroneal tendinitis, left leg: Secondary | ICD-10-CM

## 2021-07-25 DIAGNOSIS — M7751 Other enthesopathy of right foot: Secondary | ICD-10-CM | POA: Diagnosis not present

## 2021-07-25 DIAGNOSIS — M7671 Peroneal tendinitis, right leg: Secondary | ICD-10-CM

## 2021-07-25 DIAGNOSIS — L909 Atrophic disorder of skin, unspecified: Secondary | ICD-10-CM | POA: Diagnosis not present

## 2021-07-25 MED ORDER — BETAMETHASONE SOD PHOS & ACET 6 (3-3) MG/ML IJ SUSP: 3.0000 mg | Freq: Once | INTRAMUSCULAR | Status: DC

## 2021-07-25 NOTE — Progress Notes (Signed)
HPI: 75 y.o. female presenting today for follow-up evaluation of bilateral foot pain.  Patient states the last injection she received several months prior helped significantly for a long period of time.  Slowly over the last few weeks the pain is returned.  She would like to receive injections again today.  She is not interested in surgery at the moment.  Past Medical History:  Diagnosis Date   Arthritis    osteo   Asthma    needs rescue inhaler few times a year   Claustrophobia 04/21/2020   Depression    Dizziness    light headed spells but it has been a while   Dysrhythmia    tachycardia.(cardizem). brady during procedures   Fibromyalgia    GERD (gastroesophageal reflux disease)    gastritis   Headache(784.0)    Heart murmur 2019   aortic. dr. Nehemiah Massed not worried about this   Hyperlipidemia    Hypertension    Hypothyroidism    Macular degeneration    Migraine    Orthopnea    Pneumonia    long time ago (greater than 5 years ago)   PONV (postoperative nausea and vomiting)    very anxious during cataract surgery. brady during colonoscopy   Shortness of breath    any exertion, cannot lie flat     Physical Exam: General: The patient is alert and oriented x3 in no acute distress.  Dermatology: Skin is warm, dry and supple bilateral lower extremities. Negative for open lesions or macerations.  Vascular: Palpable pedal pulses bilaterally. No edema or erythema noted. Capillary refill within normal limits.  Neurological: Light touch and protective threshold grossly intact bilaterally.   Musculoskeletal Exam: There is diffuse pain on palpation throughout the entire foot.  Fat pad atrophy is noted bilateral with prominent fifth metatarsal tubercles.  There is pain on palpation along the peroneal tendon right lower extremity just posterior and distal to the lateral malleolus.  There is also pain on palpation to the medial aspect of the right ankle  XR foot and ankle B/L:   Advanced degenerative changes noted throughout the midtarsal joints of the foot best visualized on lateral view.  Degenerative TN joints noted with joint space narrowing and periarticular spurring.  No fractures identified.  Ankle joint is mostly preserved.  The forefoot is actually in good rectus alignment with some mild hammertoes to the fourth and fifth digits bilateral.  Assessment: 1.  DJD/arthritis/capsulitis right ankle; medial aspect 2.  Peroneal tendinitis bilateral. RT > LT 3.  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  Fat pad atrophy bilateral   Plan of Care:  1. Patient evaluated. 2.  Injection of 0.5 cc Celestone Soluspan injection the right ankle as well as along the peroneal tendon sheath bilateral 3.  Continue wearing good supportive shoes 4.  Patient is unable to take NSAIDs due to GI upset.  Patient is actively managed by a rheumatologist.  Continue. 5.  Patient states that she cannot afford custom molded insoles although this would probably be the best solution for her.  Continue wearing good supportive shoes that support the ankles.  She is currently wearing hightop hiking boots which she likes. 6.  Return to clinic as needed      Edrick Kins, DPM [facility]  Dr. Edrick Kins, DPM    [address]  [place], [place] [address]                Office [phone]  Fax [phone]

## 2021-07-29 DIAGNOSIS — I10 Essential (primary) hypertension: Secondary | ICD-10-CM | POA: Diagnosis not present

## 2021-07-30 ENCOUNTER — Telehealth: Payer: Self-pay | Admitting: Podiatry

## 2021-07-30 NOTE — Telephone Encounter (Signed)
Patient called stating she saw Dr Amalia Hailey last Friday and had injections. Patient states its very sore and swollen in the area he gave the shots. Patient is not sure what to do or if its normal. Patients number is [phone].

## 2021-07-31 ENCOUNTER — Telehealth: Payer: Self-pay | Admitting: Podiatry

## 2021-07-31 NOTE — Telephone Encounter (Signed)
"  URGENT"  Patient called yesterday and left a message. Pt was calling again today because she hasn't heard back and she really wants to know what to do. Pt had injection last week from Dr Amalia Hailey. Patient states there is an area  where she received the shot its very sore and swollen. Patient wants to know if she should be concerned and what should she do.

## 2021-07-31 NOTE — Telephone Encounter (Signed)
"  URGENT"  Patient called again this morning asking what she should do about her ankle being swollen where she had a shot last week.

## 2021-07-31 NOTE — Telephone Encounter (Signed)
To spoke with the patient on the phone.  All patient questions answered.  She will contact the office if needed.  Thanks, Dr. Amalia Hailey

## 2021-07-31 NOTE — Telephone Encounter (Signed)
Spoke to patient. Quarter size red area that hasn't changed in size. The boot makes it feel much better. Will observe for now. Instructed to call the office if the pain/redness increases or she sees or feels any sign of worsening infection.  - Dr. Amalia Hailey

## 2021-08-06 ENCOUNTER — Ambulatory Visit (INDEPENDENT_AMBULATORY_CARE_PROVIDER_SITE_OTHER): Payer: Medicare Other | Admitting: Podiatry

## 2021-08-06 ENCOUNTER — Other Ambulatory Visit: Payer: Self-pay

## 2021-08-06 DIAGNOSIS — T380X5A Adverse effect of glucocorticoids and synthetic analogues, initial encounter: Secondary | ICD-10-CM

## 2021-08-06 DIAGNOSIS — Q6651 Congenital pes planus, right foot: Secondary | ICD-10-CM | POA: Diagnosis not present

## 2021-08-06 MED ORDER — METHYLPREDNISOLONE 4 MG PO TBPK: ORAL_TABLET | ORAL | 0 refills | Status: DC

## 2021-08-11 NOTE — Progress Notes (Signed)
°  Subjective:  Patient ID: Vanessa Evans, female    DOB: 10/16/1945,  MRN: [id number]  Chief Complaint  Patient presents with   Foot Swelling    swelling right ankle after injection on 07/25/21    76 y.o. female presents with the above complaint. History confirmed with patient.  She was having issues with continued swelling after her most recent injection but as of yesterday it started to improve some she had been wearing the boot at home and the pain is improving as well  Objective:  Physical Exam: warm, good capillary refill, no trophic changes or ulcerative lesions, normal DP and PT pulses, and normal sensory exam.  Right Foot: Some residual swelling around dorsal lateral ankle  Assessment:   1. Adverse effect of corticosteroids, initial encounter   2. Congenital pes planus, right foot      Plan:  Patient was evaluated and treated and all questions answered.  Overall seems to be improving.  Likely was a steroid flare.  I prescribed her methylprednisolone taper to continue to reduce her inflammation and pain.  I think she should probably consider the option of long-term bracing such as a mezzo brace and we will consider having her scheduled for orthotist to have this evaluated  Return if symptoms worsen or fail to improve.

## 2021-08-18 DIAGNOSIS — H903 Sensorineural hearing loss, bilateral: Secondary | ICD-10-CM | POA: Diagnosis not present

## 2021-08-18 DIAGNOSIS — H6983 Other specified disorders of Eustachian tube, bilateral: Secondary | ICD-10-CM | POA: Diagnosis not present

## 2021-08-18 DIAGNOSIS — J301 Allergic rhinitis due to pollen: Secondary | ICD-10-CM | POA: Diagnosis not present

## 2021-08-19 DIAGNOSIS — E039 Hypothyroidism, unspecified: Secondary | ICD-10-CM | POA: Diagnosis not present

## 2021-09-05 DIAGNOSIS — J310 Chronic rhinitis: Secondary | ICD-10-CM | POA: Diagnosis not present

## 2021-09-05 DIAGNOSIS — H6982 Other specified disorders of Eustachian tube, left ear: Secondary | ICD-10-CM | POA: Diagnosis not present

## 2021-09-05 DIAGNOSIS — R42 Dizziness and giddiness: Secondary | ICD-10-CM | POA: Diagnosis not present

## 2021-09-09 ENCOUNTER — Ambulatory Visit: Payer: Self-pay | Admitting: Physician Assistant

## 2021-09-11 ENCOUNTER — Encounter: Payer: Self-pay | Admitting: Physician Assistant

## 2021-09-11 ENCOUNTER — Other Ambulatory Visit: Payer: Self-pay

## 2021-09-11 ENCOUNTER — Ambulatory Visit (INDEPENDENT_AMBULATORY_CARE_PROVIDER_SITE_OTHER): Payer: Medicare Other | Admitting: Physician Assistant

## 2021-09-11 DIAGNOSIS — F331 Major depressive disorder, recurrent, moderate: Secondary | ICD-10-CM

## 2021-09-11 DIAGNOSIS — J301 Allergic rhinitis due to pollen: Secondary | ICD-10-CM

## 2021-09-11 DIAGNOSIS — E782 Mixed hyperlipidemia: Secondary | ICD-10-CM | POA: Diagnosis not present

## 2021-09-11 DIAGNOSIS — Z0001 Encounter for general adult medical examination with abnormal findings: Secondary | ICD-10-CM

## 2021-09-11 DIAGNOSIS — R5383 Other fatigue: Secondary | ICD-10-CM | POA: Diagnosis not present

## 2021-09-11 DIAGNOSIS — E538 Deficiency of other specified B group vitamins: Secondary | ICD-10-CM | POA: Diagnosis not present

## 2021-09-11 DIAGNOSIS — R0602 Shortness of breath: Secondary | ICD-10-CM | POA: Diagnosis not present

## 2021-09-11 DIAGNOSIS — R7303 Prediabetes: Secondary | ICD-10-CM | POA: Diagnosis not present

## 2021-09-11 DIAGNOSIS — R300 Dysuria: Secondary | ICD-10-CM | POA: Diagnosis not present

## 2021-09-11 DIAGNOSIS — Z1231 Encounter for screening mammogram for malignant neoplasm of breast: Secondary | ICD-10-CM | POA: Diagnosis not present

## 2021-09-11 DIAGNOSIS — E559 Vitamin D deficiency, unspecified: Secondary | ICD-10-CM | POA: Diagnosis not present

## 2021-09-11 DIAGNOSIS — I10 Essential (primary) hypertension: Secondary | ICD-10-CM | POA: Diagnosis not present

## 2021-09-11 MED ORDER — FLUTICASONE PROPIONATE 50 MCG/ACT NA SUSP: NASAL | 2 refills | Status: DC

## 2021-09-11 MED ORDER — CITALOPRAM HYDROBROMIDE 10 MG PO TABS: 10.0000 mg | ORAL_TABLET | Freq: Every day | ORAL | 1 refills | Status: DC

## 2021-09-11 NOTE — Progress Notes (Signed)
[facility] [place], [place] [address]  Internal MEDICINE  Office Visit Note  Patient Name: Vanessa Evans  [id number]  [id number]  Date of Service: 09/16/2021  Chief Complaint  Patient presents with   Medicare Wellness    Tingling in left foot in ankle and a little on the right, urine leakage   Depression   Gastroesophageal Reflux   Hyperlipidemia   Anxiety   Asthma   Hypertension   Fatigue   Back Pain    Lower, over the last few months has been getting worse   Shortness of Breath   Wheezing   Medication Refill     HPI Pt is here for routine health maintenance examination -some swelling in ankles and SOB, saw cardiology in oct and put on losartan. May need diuretic and updated echo. Was told to call cardiology if this happened which she will do now -did have some cortisone injections that led to worsening diet and may contribute to rising sugars. She is followed by podiatry -endocrinology for thyroid and prediabetes, A1c was 6.3. Reports she only sees endocrinology once per year so will go ahead and recheck a1c with routine fasting labs.  -she is due for mammogram as well as some medication refills -she has had some intermittent urine leakage and does have low back pain that she may follow up with ortho regarding. Given intermittent leakage and no loss of control of bowels unlikely acute concern at this time however if complete loss of control or any worsening was advised to seek help immediately. Urine will be checked as part of CPE today and reports some hx of incontinence just seems to be a little worse lately.   Current Medication: Outpatient Encounter Medications as of 09/11/2021  Medication Sig   acetaminophen (TYLENOL) 500 MG tablet Take 1,000 mg by mouth 2 (two) times daily as needed for moderate pain.   albuterol (PROVENTIL) (2.5 MG/3ML) 0.083% nebulizer solution Take 3 mLs (2.5 mg total) by nebulization every 6 (six) hours as needed for  wheezing or shortness of breath.   albuterol (VENTOLIN HFA) 108 (90 Base) MCG/ACT inhaler Inhale 2 puffs into the lungs every 4 (four) hours as needed for wheezing or shortness of breath.   aspirin EC 81 MG tablet Take 81 mg by mouth at bedtime.    benzonatate (TESSALON) 100 MG capsule Take 2 capsules (200 mg total) by mouth 2 (two) times daily as needed for cough.   BLACK ELDERBERRY PO Take 1 Dose by mouth 2 (two) times a day.   Calcium Carbonate-Vitamin D 600-400 MG-UNIT tablet Take 1 tablet by mouth 2 (two) times daily.   cholecalciferol (VITAMIN D) 1000 UNITS tablet Take 1,000 Units by mouth at bedtime.    cyanocobalamin 500 MCG tablet Take 500 mcg by mouth daily.   Dextran 70-Hypromellose (ARTIFICIAL TEARS) 0.1-0.3 % SOLN Apply 1-2 drops to eye daily.   diclofenac Sodium (VOLTAREN) 1 % GEL Apply 4 g topically 4 (four) times daily.   diltiazem (CARDIZEM CD) 120 MG 24 hr capsule Take 120 mg by mouth at bedtime.    diphenhydrAMINE (BENADRYL) 25 MG tablet Take 25-50 mg by mouth daily as needed for itching.   docusate sodium (COLACE) 100 MG capsule Take 200 mg by mouth at bedtime as needed for moderate constipation.    EPINEPHrine (EPIPEN 2-PAK) 0.3 mg/0.3 mL IJ SOAJ injection use as directed for severe allergy reaction   fenofibrate 160 MG tablet TAKE 1 TABLET DAILY FOR CHOLESTEROL  folic acid (FOLVITE) 825 MCG tablet Take 400 mcg by mouth daily.   guaiFENesin (MUCINEX) 600 MG 12 hr tablet Take 600 mg by mouth at bedtime as needed (congestion).   Hypromellose (ARTIFICIAL TEARS OP) Place 1-2 drops into both eyes daily as needed (dry eyes).   ketotifen (ZADITOR) 0.025 % ophthalmic solution Place 1 drop into both eyes 2 (two) times daily as needed (allergies).   levocetirizine (XYZAL) 5 MG tablet Take 1 tablet (5 mg total) by mouth every evening.   levothyroxine (SYNTHROID, LEVOTHROID) 50 MCG tablet Take 50 mcg by mouth daily before breakfast. Brand Name only   losartan (COZAAR) 25 MG tablet Take  25 mg by mouth daily.   Magnesium 250 MG TABS Take 1 tablet by mouth daily.   Melatonin 5 MG TABS Take 1 tablet by mouth at bedtime. For sleep   Multiple Vitamins-Minerals (MULTIVITAMIN WITH MINERALS) tablet Take 1 tablet by mouth daily.   OXYGEN Inhale into the lungs. 2 litre at night   pantoprazole (PROTONIX) 40 MG tablet Take 1 tablet (40 mg total) by mouth 2 (two) times daily.   pyridoxine (B-6) 100 MG tablet Take 100 mg by mouth daily.   sodium chloride (OCEAN) 0.65 % SOLN nasal spray Place 1 spray into both nostrils 2 (two) times daily.   sucralfate (CARAFATE) 1 g tablet Take 3 times daily with meals.   Vitamin A 7.5 MG (25000 UT) CAPS Take 1 tablet by mouth 2 (two) times daily.   zinc gluconate 50 MG tablet Take 50 mg by mouth daily.   [DISCONTINUED] citalopram (CELEXA) 10 MG tablet Take 1 tablet (10 mg total) by mouth daily.   [DISCONTINUED] fluticasone (FLONASE) 50 MCG/ACT nasal spray Use 2 sprays in each nostril daily,   citalopram (CELEXA) 10 MG tablet Take 1 tablet (10 mg total) by mouth daily.   fluticasone (FLONASE) 50 MCG/ACT nasal spray Use 2 sprays in each nostril daily,   [DISCONTINUED] amoxicillin-clavulanate (AUGMENTIN) 875-125 MG tablet Take 1 tablet by mouth 2 (two) times daily. Take with food. (Patient not taking: Reported on 06/30/2021)   [DISCONTINUED] fluticasone (FLONASE) 50 MCG/ACT nasal spray Place into the nose.   [DISCONTINUED] methylPREDNISolone (MEDROL DOSEPAK) 4 MG TBPK tablet 6 day dose pack - take as directed (Patient not taking: Reported on 09/11/2021)   No facility-administered encounter medications on file as of 09/11/2021.    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 cope.  all benign.  inflammatory process going on in bladder   BREAST BIOPSY Right    benign    BREAST SURGERY Right    lumpectomy   CATARACT EXTRACTION W/PHACO Left 12/25/2014   Procedure: CATARACT EXTRACTION PHACO AND INTRAOCULAR LENS PLACEMENT ([place]);  Surgeon: Birder Robson, MD;  Location: [facility] ORS;  Service: Ophthalmology;  Laterality: Left;  [place] 00:32 AP% 20.0 CDE 6.49   COLONOSCOPY W/ BIOPSIES     COLONOSCOPY W/ POLYPECTOMY  2002, 2004, 2005   adenomatous polyps removed   COLONOSCOPY WITH PROPOFOL N/A 08/23/2017   Procedure: COLONOSCOPY WITH PROPOFOL;  Surgeon: Manya Silvas, MD;  Location: [facility] ENDOSCOPY;  Service: Endoscopy;  Laterality: N/A;   COLONOSCOPY WITH PROPOFOL N/A 11/08/2017   Procedure: COLONOSCOPY WITH PROPOFOL;  Surgeon: Manya Silvas, MD;  Location: [facility]  ENDOSCOPY;  Service: Endoscopy;  Laterality: N/A;   COLONOSCOPY WITH PROPOFOL N/A 06/30/2021   Procedure: COLONOSCOPY WITH PROPOFOL;  Surgeon: Lesly Rubenstein, MD;  Location: [facility] ENDOSCOPY;  Service: Endoscopy;  Laterality: N/A;   CYSTOCELE REPAIR N/A 04/09/2016   Procedure: ANTERIOR REPAIR (CYSTOCELE);  Surgeon: Gae Dry, MD;  Location: [facility] ORS;  Service: Gynecology;  Laterality: N/A;   DIAGNOSTIC LAPAROSCOPY  2008   removed both ovaries with cysts, tubes and fibroids   DILATION AND CURETTAGE OF UTERUS  1973   ESOPHAGOGASTRODUODENOSCOPY (EGD) WITH PROPOFOL N/A 08/23/2017   Procedure: ESOPHAGOGASTRODUODENOSCOPY (EGD) WITH PROPOFOL;  Surgeon: Manya Silvas, MD;  Location: [facility] ENDOSCOPY;  Service: Endoscopy;  Laterality: N/A;   ESOPHAGOGASTRODUODENOSCOPY (EGD) WITH PROPOFOL N/A 06/30/2021   Procedure: ESOPHAGOGASTRODUODENOSCOPY (EGD) WITH PROPOFOL;  Surgeon: Lesly Rubenstein, MD;  Location: [facility] ENDOSCOPY;  Service: Endoscopy;  Laterality: N/A;   EYE SURGERY Bilateral 2016   HERNIA REPAIR Right 2008   Inguinal hernia Repair, ventral hernia repair   JOINT REPLACEMENT Right 2008   knee   KNEE ARTHROSCOPY Right    LUMBAR LAMINECTOMY/DECOMPRESSION MICRODISCECTOMY Right 03/21/2013    Procedure: Right Lumbar five-Sacral one Laminectomy for Synovial Cyst;  Surgeon: Faythe Ghee, MD;  Location: [facility] NEURO ORS;  Service: Neurosurgery;  Laterality: Right;  right   OOPHORECTOMY     REVERSE SHOULDER ARTHROPLASTY Right 02/22/2018   Procedure: REVERSE SHOULDER ARTHROPLASTY;  Surgeon: Corky Mull, MD;  Location: [facility] ORS;  Service: Orthopedics;  Laterality: Right;   TUBAL LIGATION     UNILATERAL SALPINGECTOMY Left 04/09/2016   Procedure: UNILATERAL SALPINGECTOMY;  Surgeon: Gae Dry, MD;  Location: [facility] ORS;  Service: Gynecology;  Laterality: Left;   UPPER GI ENDOSCOPY  2010   with biopsy of gastric erosion   VAGINAL HYSTERECTOMY N/A 04/09/2016   Procedure: HYSTERECTOMY VAGINAL;  Surgeon: Gae Dry, MD;  Location: [facility] ORS;  Service: Gynecology;  Laterality: N/A;   VAGINAL HYSTERECTOMY  2017   and bladder tack    Medical History: Past Medical History:  Diagnosis Date   Arthritis    osteo   Asthma    needs rescue inhaler few times a year   Claustrophobia 04/21/2020   Depression    Dizziness    light headed spells but it has been a while   Dysrhythmia    tachycardia.(cardizem). brady during procedures   Fibromyalgia    GERD (gastroesophageal reflux disease)    gastritis   Headache(784.0)    Heart murmur 2019   aortic. dr. Nehemiah Massed not worried about this   Hyperlipidemia    Hypertension    Hypothyroidism    Macular degeneration    Migraine    Orthopnea    Pneumonia    long time ago (greater than 5 years ago)   PONV (postoperative nausea and vomiting)    very anxious during cataract surgery. brady during colonoscopy   Shortness of breath    any exertion, cannot lie flat    Family History: Family History  Problem Relation Age of Onset   Pulmonary fibrosis Mother    Melanoma Father    COPD Sister    Cardiomyopathy Sister        and arrhythmia   Atrial fibrillation Sister    COPD Brother    Pulmonary fibrosis Brother    Cancer Brother     Breast cancer Cousin        paternal 1st      Review of Systems  Constitutional:  Negative  for chills, fatigue and unexpected weight change.  HENT:  Negative for congestion, rhinorrhea, sneezing and sore throat.   Eyes:  Negative for redness.  Respiratory:  Positive for shortness of breath. Negative for cough and chest tightness.   Cardiovascular:  Positive for leg swelling. Negative for chest pain and palpitations.  Gastrointestinal:  Negative for abdominal pain, constipation, diarrhea, nausea and vomiting.  Genitourinary:  Negative for dysuria and frequency.       Intermittent urine leakage  Musculoskeletal:  Positive for arthralgias and back pain. Negative for joint swelling and neck pain.  Skin:  Negative for rash.  Neurological: Negative.  Negative for tremors and numbness.  Hematological:  Negative for adenopathy. Does not bruise/bleed easily.  Psychiatric/Behavioral:  Negative for behavioral problems (Depression), sleep disturbance and suicidal ideas. The patient is not nervous/anxious.     Vital Signs: BP 140/72 Comment: 154/72   Pulse 100    Temp 98.6 F (37 C)    Resp 16    Ht 5' 0.5" (1.537 m)    Wt 156 lb 9.6 oz (71 kg)    SpO2 98%    BMI 30.08 kg/m    Physical Exam Vitals and nursing note reviewed.  Constitutional:      General: She is not in acute distress.    Appearance: She is well-developed. She is not diaphoretic.  HENT:     Head: Normocephalic and atraumatic.     Mouth/Throat:     Pharynx: No oropharyngeal exudate.  Eyes:     Pupils: Pupils are equal, round, and reactive to light.  Neck:     Thyroid: No thyromegaly.     Vascular: No JVD.     Trachea: No tracheal deviation.  Cardiovascular:     Rate and Rhythm: Normal rate and regular rhythm.     Heart sounds: Normal heart sounds. No murmur heard.   No friction rub. No gallop.  Pulmonary:     Effort: Pulmonary effort is normal. No respiratory distress.     Breath sounds: No wheezing or rales.   Chest:     Chest wall: No tenderness.  Abdominal:     General: Bowel sounds are normal.     Palpations: Abdomen is soft.     Tenderness: There is no abdominal tenderness.  Musculoskeletal:        General: Normal range of motion.     Cervical back: Normal range of motion and neck supple.     Right lower leg: Edema present.     Left lower leg: Edema present.  Lymphadenopathy:     Cervical: No cervical adenopathy.  Skin:    General: Skin is warm and dry.  Neurological:     Mental Status: She is alert and oriented to person, place, and time.     Cranial Nerves: No cranial nerve deficit.  Psychiatric:        Behavior: Behavior normal.        Thought Content: Thought content normal.        Judgment: Judgment normal.     LABS: Recent Results (from the past 2160 hour(s))  POCT Influenza A/B     Status: None   Collection Time: 06/26/21 11:57 AM  Result Value Ref Range   Influenza A, POC Negative Negative   Influenza B, POC Negative Negative  Surgical pathology     Status: None   Collection Time: 06/30/21  7:57 AM  Result Value Ref Range   SURGICAL PATHOLOGY      SURGICAL PATHOLOGY  CASE: [phone] PATIENT: Olena Robideau Surgical Pathology Report     Specimen Submitted: A. Stomach; cbx B. Esophagus, distal; cbx C. Esophagus, proximal; cbx D. Colon polyp, ascending; cbx E. Colon polyp, transverse; cbx  Clinical History: GERD and H/O polyps.  Diverticulosis, polyp, hemorrhoids      DIAGNOSIS: A. STOMACH; COLD BIOPSY: - ANTRAL AND OXYNTIC MUCOSA WITH NO SIGNIFICANT PATHOLOGIC ALTERATION. - NEGATIVE FOR ACTIVE INFLAMMATION AND H PYLORI. - NEGATIVE FOR INCREASED EOSINOPHILS. - NEGATIVE FOR INTESTINAL METAPLASIA, DYSPLASIA, AND MALIGNANCY.  B. ESOPHAGUS, DISTAL; COLD BIOPSY: - SQUAMOCOLUMNAR JUNCTION WITH NO SIGNIFICANT PATHOLOGIC ALTERATION. - NEGATIVE FOR INCREASED EOSINOPHILS. - NEGATIVE FOR INTESTINAL METAPLASIA, DYSPLASIA, AND MALIGNANCY.  C.  ESOPHAGUS, PROXIMAL; COLD BIOPSY: - SQUAMOUS MUCOSA WITH NO SIGNIFICANT PATHOLOGIC ALTERATION. - NEGATIVE FOR INCREASED EOSINOPHILS. - NEGATIVE FOR INTESTINAL METAP LASIA, DYSPLASIA, AND MALIGNANCY.  D. COLON POLYP, ASCENDING; COLD BIOPSY: - TUBULAR ADENOMA. - NEGATIVE FOR HIGH GRADE DYSPLASIA AND MALIGNANCY.  E. COLON POLYP, TRANSVERSE; COLD BIOPSY: - TUBULAR ADENOMA. - NEGATIVE FOR HIGH GRADE DYSPLASIA AND MALIGNANCY.   GROSS DESCRIPTION: A. Labeled: Gastric cbxs rule out eosinophilic gastritis Received: Formalin Collection time: 7:57 AM on 06/30/2021 Placed into formalin time: 7:57 AM on 06/30/2021 Tissue fragment(s): Multiple Size: Aggregate, 1 x 0.4 x 0.2 cm Description: Tan soft tissue fragments Entirely submitted in 1 cassette.  B. Labeled: Distal esophagus cbxs rule out EOE Received: Formalin Collection time: 7:58 AM on 06/30/2021 Placed into formalin time: 7:58 AM on 06/30/2021 Tissue fragment(s): Multiple Size: Aggregate, 1 x 0.4 x 0.2 cm Description: White-pink translucent soft tissue fragments Entirely submitted in 1 cassette.  C. Labeled: Proximal esophagus cbxs rule out EOE Received: Formalin Collectio n time: 7:59 AM on 06/30/2021 Placed into formalin time: 7:59 AM on 06/30/2021 Tissue fragment(s): Multiple Size: Aggregate, 1.4 x 0.6 x 0.1 cm Description: White-red translucent soft tissue fragments Entirely submitted in 1 cassette.  D. Labeled: Ascending colon polyp cbx Received: Formalin Collection time: 8:15 AM on 06/30/2021 Placed into formalin time: 8:15 AM on 06/30/2021 Tissue fragment(s): 1 Size: 0.4 x 0.3 x 0.2 cm Description: Tan soft tissue fragment Entirely submitted in 1 cassette.  E. Labeled: Transverse colon polyp cbx Received: Formalin Collection time: 8:19 AM on 06/30/2021 Placed into formalin time: 8:19 AM on 06/30/2021 Tissue fragment(s): 1 Size: 0.4 x 0.3 x 0.2 cm Description: Tan soft tissue fragment Entirely submitted in 1  cassette.  RB 06/30/2021   Final Diagnosis performed by Betsy Pries, MD.   Electronically signed 07/01/2021 10:24:22AM The electronic signature indicates that the named Attending Pathologist has evaluated the s pecimen Technical component performed at [facility], [address] [place], [place] [address] Lab: [phone] Dir: Rush Farmer, MD, MMM  Professional component performed at [facility], [address] [facility], [place], [place], [place] [address] Lab: [phone] Dir: Kathi Simpers, MD   UA/M w/rflx Culture, Routine     Status: None   Collection Time: 09/11/21 11:56 AM   Specimen: Urine   Urine  Result Value Ref Range   Specific Gravity, UA 1.009 1.005 - 1.030   pH, UA 6.5 5.0 - 7.5   Color, UA Yellow Yellow   Appearance Ur Clear Clear   Leukocytes,UA Negative Negative   Protein,UA Negative Negative/Trace   Glucose, UA Negative Negative   Ketones, UA Negative Negative   RBC, UA Negative Negative   Bilirubin, UA Negative Negative   Urobilinogen, Ur 0.2 0.2 - 1.0 mg/dL   Nitrite, UA Negative Negative   Microscopic Examination Comment  Comment: Microscopic follows if indicated.   Microscopic Examination See below:     Comment: Microscopic was indicated and was performed.   Urinalysis Reflex Comment     Comment: This specimen will not reflex to a Urine Culture.  Microscopic Examination     Status: None   Collection Time: 09/11/21 11:56 AM   Urine  Result Value Ref Range   WBC, UA None seen 0 - 5 /hpf   RBC None seen 0 - 2 /hpf   Epithelial Cells (non renal) None seen 0 - 10 /hpf   Casts None seen None seen /lpf   Bacteria, UA None seen None seen/Few        Assessment/Plan: 1. Encounter for general adult medical examination with abnormal findings CPE performed, routine fasting labs ordered, mammogram order placed  2. Benign essential HTN Stable, however patient has been having some shortness of breath and ankle swelling and states she  was told by cardiology to follow-up with them if this occurred as she may need a diuretic.  Patient is going to contact cardiology  3. Shortness of breath Due to shortness of breath and some ankle swelling patient is going to contact cardiology as she was previously advised by them if the symptoms occurred  4. Moderate episode of recurrent major depressive disorder (HCC) Stable, continue current medication - citalopram (CELEXA) 10 MG tablet; Take 1 tablet (10 mg total) by mouth daily.  Dispense: 90 tablet; Refill: 1  5. Allergic rhinitis due to pollen, unspecified seasonality - fluticasone (FLONASE) 50 MCG/ACT nasal spray; Use 2 sprays in each nostril daily,  Dispense: 16 g; Refill: 2  6. Visit for screening mammogram - MM 3D SCREEN BREAST BILATERAL; Future  7. Mixed hyperlipidemia - Lipid Panel With LDL/HDL Ratio  8. B12 deficiency - B12 and Folate Panel  9. Vitamin D deficiency - VITAMIN D 25 Hydroxy (Vit-D Deficiency, Fractures)  10. Prediabetes Normally followed by endocrinology, however patient does not have appt for recheck anytime soon and has been worried about sugars with steroid injections therefore will check with rest of labs - Hgb A1C w/o eAG  11. Other fatigue - CBC w/Diff/Platelet - Comprehensive metabolic panel - Fe+TIBC+Fer  12. Dysuria - UA/M w/rflx Culture, Routine   General Counseling: Chrishawn verbalizes understanding of the findings of todays visit and agrees with plan of treatment. I have discussed any further diagnostic evaluation that may be needed or ordered today. We also reviewed her medications today. she has been encouraged to call the office with any questions or concerns that should arise related to todays visit.    Counseling:    Orders Placed This Encounter  Procedures   Microscopic Examination   MM 3D SCREEN BREAST BILATERAL   UA/M w/rflx Culture, Routine   CBC w/Diff/Platelet   Comprehensive metabolic panel   Lipid Panel With  LDL/HDL Ratio   Hgb A1C w/o eAG   VITAMIN D 25 Hydroxy (Vit-D Deficiency, Fractures)   B12 and Folate Panel   Fe+TIBC+Fer    Meds ordered this encounter  Medications   citalopram (CELEXA) 10 MG tablet    Sig: Take 1 tablet (10 mg total) by mouth daily.    Dispense:  90 tablet    Refill:  1    Wean off duloxetine. Causing severe constipation.   fluticasone (FLONASE) 50 MCG/ACT nasal spray    Sig: Use 2 sprays in each nostril daily,    Dispense:  16 g    Refill:  2  This patient was seen by Drema Dallas, PA-C in collaboration with Dr. Clayborn Bigness as a part of collaborative care agreement.  Total time spent:35 Minutes  Time spent includes review of chart, medications, test results, and follow up plan with the patient.     Lavera Guise, MD  Internal Medicine

## 2021-09-12 LAB — UA/M W/RFLX CULTURE, ROUTINE
Bilirubin, UA: NEGATIVE
Glucose, UA: NEGATIVE
Ketones, UA: NEGATIVE
Leukocytes,UA: NEGATIVE
Nitrite, UA: NEGATIVE
Protein,UA: NEGATIVE
RBC, UA: NEGATIVE
Specific Gravity, UA: 1.009 (ref 1.005–1.030)
Urobilinogen, Ur: 0.2 mg/dL (ref 0.2–1.0)
pH, UA: 6.5 (ref 5.0–7.5)

## 2021-09-12 LAB — MICROSCOPIC EXAMINATION
Bacteria, UA: NONE SEEN
Casts: NONE SEEN /lpf
Epithelial Cells (non renal): NONE SEEN /hpf (ref 0–10)
RBC, Urine: NONE SEEN /hpf (ref 0–2)
WBC, UA: NONE SEEN /hpf (ref 0–5)

## 2021-09-16 ENCOUNTER — Other Ambulatory Visit
Admission: RE | Admit: 2021-09-16 | Discharge: 2021-09-16 | Disposition: A | Discharge status: Home / Self Care | Payer: Medicare Other | Source: Ambulatory Visit | Attending: Cardiology | Admitting: Cardiology

## 2021-09-16 DIAGNOSIS — R0602 Shortness of breath: Secondary | ICD-10-CM | POA: Insufficient documentation

## 2021-09-16 DIAGNOSIS — I471 Supraventricular tachycardia: Secondary | ICD-10-CM | POA: Diagnosis not present

## 2021-09-16 DIAGNOSIS — R600 Localized edema: Secondary | ICD-10-CM | POA: Diagnosis not present

## 2021-09-16 DIAGNOSIS — I25119 Atherosclerotic heart disease of native coronary artery with unspecified angina pectoris: Secondary | ICD-10-CM | POA: Diagnosis not present

## 2021-09-16 DIAGNOSIS — I700 Atherosclerosis of aorta: Secondary | ICD-10-CM | POA: Diagnosis not present

## 2021-09-16 LAB — BRAIN NATRIURETIC PEPTIDE: B Natriuretic Peptide: 14.4 pg/mL (ref 0.0–100.0)

## 2021-09-17 ENCOUNTER — Other Ambulatory Visit: Payer: Self-pay | Admitting: Cardiology

## 2021-09-17 DIAGNOSIS — R0602 Shortness of breath: Secondary | ICD-10-CM | POA: Diagnosis not present

## 2021-09-17 DIAGNOSIS — R600 Localized edema: Secondary | ICD-10-CM | POA: Diagnosis not present

## 2021-09-18 DIAGNOSIS — H5711 Ocular pain, right eye: Secondary | ICD-10-CM | POA: Diagnosis not present

## 2021-09-22 DIAGNOSIS — H353211 Exudative age-related macular degeneration, right eye, with active choroidal neovascularization: Secondary | ICD-10-CM | POA: Diagnosis not present

## 2021-09-23 ENCOUNTER — Other Ambulatory Visit: Payer: Self-pay

## 2021-09-23 ENCOUNTER — Ambulatory Visit
Admission: RE | Admit: 2021-09-23 | Discharge: 2021-09-23 | Disposition: A | Discharge status: Home / Self Care | Payer: Medicare Other | Source: Ambulatory Visit | Attending: Cardiology | Admitting: Cardiology

## 2021-09-23 DIAGNOSIS — R600 Localized edema: Secondary | ICD-10-CM | POA: Diagnosis not present

## 2021-09-23 DIAGNOSIS — R112 Nausea with vomiting, unspecified: Secondary | ICD-10-CM | POA: Diagnosis not present

## 2021-09-23 DIAGNOSIS — R110 Nausea: Secondary | ICD-10-CM | POA: Diagnosis not present

## 2021-09-23 DIAGNOSIS — K219 Gastro-esophageal reflux disease without esophagitis: Secondary | ICD-10-CM | POA: Diagnosis not present

## 2021-09-23 DIAGNOSIS — R6881 Early satiety: Secondary | ICD-10-CM | POA: Diagnosis not present

## 2021-09-24 ENCOUNTER — Other Ambulatory Visit: Payer: Self-pay | Admitting: Gastroenterology

## 2021-09-24 DIAGNOSIS — R110 Nausea: Secondary | ICD-10-CM

## 2021-09-24 DIAGNOSIS — R112 Nausea with vomiting, unspecified: Secondary | ICD-10-CM

## 2021-09-24 DIAGNOSIS — R6881 Early satiety: Secondary | ICD-10-CM

## 2021-09-24 DIAGNOSIS — K219 Gastro-esophageal reflux disease without esophagitis: Secondary | ICD-10-CM

## 2021-09-30 DIAGNOSIS — R5383 Other fatigue: Secondary | ICD-10-CM | POA: Diagnosis not present

## 2021-09-30 DIAGNOSIS — E538 Deficiency of other specified B group vitamins: Secondary | ICD-10-CM | POA: Diagnosis not present

## 2021-09-30 DIAGNOSIS — E782 Mixed hyperlipidemia: Secondary | ICD-10-CM | POA: Diagnosis not present

## 2021-09-30 DIAGNOSIS — R7303 Prediabetes: Secondary | ICD-10-CM | POA: Diagnosis not present

## 2021-09-30 DIAGNOSIS — E559 Vitamin D deficiency, unspecified: Secondary | ICD-10-CM | POA: Diagnosis not present

## 2021-09-30 LAB — LIPID PANEL WITH LDL/HDL RATIO

## 2021-10-01 LAB — B12 AND FOLATE PANEL
Folate: >20 ng/mL (ref >3.0)
Vitamin B-12: 1042 pg/mL (ref 232–1245)

## 2021-10-01 LAB — VITAMIN D 25 HYDROXY (VIT D DEFICIENCY, FRACTURES): Vit D, 25-Hydroxy: 63.1 ng/mL (ref 30.0–100.0)

## 2021-10-01 LAB — LIPID PANEL WITH LDL/HDL RATIO
Cholesterol, Total: 166 mg/dL (ref 100–199)
HDL: 48 mg/dL (ref >39)
LDL Chol Calc (NIH): 88 mg/dL (ref 0–99)
LDL/HDL Ratio: 1.8 ratio (ref 0.0–3.2)
Triglycerides: 174 mg/dL — ABNORMAL HIGH (ref 0–149)
VLDL Cholesterol Cal: 30 mg/dL (ref 5–40)

## 2021-10-01 LAB — CBC WITH DIFFERENTIAL/PLATELET
Basophils Absolute: 0.1 10*3/uL (ref 0.0–0.2)
Basos: 1 %
EOS (ABSOLUTE): 0.2 10*3/uL (ref 0.0–0.4)
Eos: 4 %
Hematocrit: 37.7 % (ref 34.0–46.6)
Hemoglobin: 12.2 g/dL (ref 11.1–15.9)
Immature Grans (Abs): 0 10*3/uL (ref 0.0–0.1)
Immature Granulocytes: 1 %
Lymphocytes Absolute: 1.4 10*3/uL (ref 0.7–3.1)
Lymphs: 28 %
MCH: 28.5 pg (ref 26.6–33.0)
MCHC: 32.4 g/dL (ref 31.5–35.7)
MCV: 88 fL (ref 79–97)
Monocytes Absolute: 0.8 10*3/uL (ref 0.1–0.9)
Monocytes: 16 %
Neutrophils Absolute: 2.5 10*3/uL (ref 1.4–7.0)
Neutrophils: 50 %
Platelets: 337 10*3/uL (ref 150–450)
RBC: 4.28 x10E6/uL (ref 3.77–5.28)
RDW: 12.6 % (ref 11.7–15.4)
WBC: 4.9 10*3/uL (ref 3.4–10.8)

## 2021-10-01 LAB — COMPREHENSIVE METABOLIC PANEL
ALT: 22 IU/L (ref 0–32)
AST: 24 IU/L (ref 0–40)
Albumin/Globulin Ratio: 2.2 (ref 1.2–2.2)
Albumin: 4.6 g/dL (ref 3.7–4.7)
Alkaline Phosphatase: 62 IU/L (ref 44–121)
BUN/Creatinine Ratio: 23 (ref 12–28)
BUN: 17 mg/dL (ref 8–27)
Bilirubin Total: 0.2 mg/dL (ref 0.0–1.2)
CO2: 25 mmol/L (ref 20–29)
Calcium: 10.5 mg/dL — ABNORMAL HIGH (ref 8.7–10.3)
Chloride: 102 mmol/L (ref 96–106)
Creatinine, Ser: 0.75 mg/dL (ref 0.57–1.00)
Globulin, Total: 2.1 g/dL (ref 1.5–4.5)
Glucose: 105 mg/dL — ABNORMAL HIGH (ref 70–99)
Potassium: 4.6 mmol/L (ref 3.5–5.2)
Sodium: 140 mmol/L (ref 134–144)
Total Protein: 6.7 g/dL (ref 6.0–8.5)
eGFR: 82 mL/min/{1.73_m2} (ref >59)

## 2021-10-01 LAB — SPECIMEN STATUS REPORT

## 2021-10-01 LAB — HGB A1C W/O EAG: Hgb A1c MFr Bld: 6 % — ABNORMAL HIGH (ref 4.8–5.6)

## 2021-10-02 ENCOUNTER — Encounter: Payer: Self-pay | Admitting: Physician Assistant

## 2021-10-02 ENCOUNTER — Ambulatory Visit (INDEPENDENT_AMBULATORY_CARE_PROVIDER_SITE_OTHER): Payer: Medicare Other | Admitting: Physician Assistant

## 2021-10-02 ENCOUNTER — Other Ambulatory Visit: Payer: Self-pay

## 2021-10-02 DIAGNOSIS — I10 Essential (primary) hypertension: Secondary | ICD-10-CM

## 2021-10-02 DIAGNOSIS — M79605 Pain in left leg: Secondary | ICD-10-CM

## 2021-10-02 DIAGNOSIS — M79604 Pain in right leg: Secondary | ICD-10-CM

## 2021-10-02 DIAGNOSIS — R600 Localized edema: Secondary | ICD-10-CM | POA: Diagnosis not present

## 2021-10-02 DIAGNOSIS — R300 Dysuria: Secondary | ICD-10-CM | POA: Diagnosis not present

## 2021-10-02 LAB — POCT URINALYSIS DIPSTICK
Bilirubin, UA: NEGATIVE
Blood, UA: NEGATIVE
Glucose, UA: NEGATIVE
Nitrite, UA: NEGATIVE
Protein, UA: NEGATIVE
Spec Grav, UA: 1.01 (ref 1.010–1.025)
Urobilinogen, UA: 0.2 E.U./dL
pH, UA: 7.5 (ref 5.0–8.0)

## 2021-10-02 NOTE — Progress Notes (Signed)
[facility] [place], [place] [address]  Internal MEDICINE  Office Visit Note  Patient Name: Vanessa Evans  [id number]  [id number]  Date of Service: 10/10/2021  Chief Complaint  Patient presents with   Urinary Tract Infection    Urgency to urinate and burning - started 2 days ago     HPI Pt is here for a sick visit as well as a follow up on blood work -Tuesday had bladder cramping, urgency, and burning, but has been better since then. -Still having leg swelling, was put on 40mg  for a week and then 20mg  after by cardiology, and cutting down on salt -They did [place] and no blood clots and she states that an echo was done and looked good -thinks swelling started after starting losartan, has had reaction to HCTZ as well -has pain with walking worse on left which is where the swelling is worse -Did discuss if due to medication typically would have bilateral swelling, but hers is predominately on the left. May be due to poor circulation and will consider vascular evaluation given pain with walking as well -She states she does have a follow-up with cardiology next week and will discuss adjusting losartan -Labs show elevated calcium, A1c at 6.0 which is improved from when it was checked endocrinology.  Otherwise labs looked good -Has been taking calcium and vit D supplement, taking 600mg  calcium twice per day and based on labs showing slight elevation will cut in half  Current Medication:  Outpatient Encounter Medications as of 10/02/2021  Medication Sig   acetaminophen (TYLENOL) 500 MG tablet Take 1,000 mg by mouth 2 (two) times daily as needed for moderate pain.   albuterol (PROVENTIL) (2.5 MG/3ML) 0.083% nebulizer solution Take 3 mLs (2.5 mg total) by nebulization every 6 (six) hours as needed for wheezing or shortness of breath.   albuterol (VENTOLIN HFA) 108 (90 Base) MCG/ACT inhaler Inhale 2 puffs into the lungs every 4 (four) hours as needed for wheezing or  shortness of breath.   aspirin EC 81 MG tablet Take 81 mg by mouth at bedtime.    benzonatate (TESSALON) 100 MG capsule Take 2 capsules (200 mg total) by mouth 2 (two) times daily as needed for cough.   BLACK ELDERBERRY PO Take 1 Dose by mouth 2 (two) times a day.   Calcium Carbonate-Vitamin D 600-400 MG-UNIT tablet Take 1 tablet by mouth 2 (two) times daily.   cholecalciferol (VITAMIN D) 1000 UNITS tablet Take 1,000 Units by mouth at bedtime.    citalopram (CELEXA) 10 MG tablet Take 1 tablet (10 mg total) by mouth daily.   cyanocobalamin 500 MCG tablet Take 500 mcg by mouth daily.   Dextran 70-Hypromellose (ARTIFICIAL TEARS) 0.1-0.3 % SOLN Apply 1-2 drops to eye daily.   diclofenac Sodium (VOLTAREN) 1 % GEL Apply 4 g topically 4 (four) times daily.   diltiazem (CARDIZEM CD) 120 MG 24 hr capsule Take 120 mg by mouth at bedtime.    diphenhydrAMINE (BENADRYL) 25 MG tablet Take 25-50 mg by mouth daily as needed for itching.   docusate sodium (COLACE) 100 MG capsule Take 200 mg by mouth at bedtime as needed for moderate constipation.    EPINEPHrine (EPIPEN 2-PAK) 0.3 mg/0.3 mL IJ SOAJ injection use as directed for severe allergy reaction   fenofibrate 160 MG tablet TAKE 1 TABLET DAILY FOR CHOLESTEROL   fluticasone (FLONASE) 50 MCG/ACT nasal spray Use 2 sprays in each nostril daily,   folic acid (FOLVITE) 416 MCG  tablet Take 400 mcg by mouth daily.   guaiFENesin (MUCINEX) 600 MG 12 hr tablet Take 600 mg by mouth at bedtime as needed (congestion).   Hypromellose (ARTIFICIAL TEARS OP) Place 1-2 drops into both eyes daily as needed (dry eyes).   ketotifen (ZADITOR) 0.025 % ophthalmic solution Place 1 drop into both eyes 2 (two) times daily as needed (allergies).   levocetirizine (XYZAL) 5 MG tablet Take 1 tablet (5 mg total) by mouth every evening.   levothyroxine (SYNTHROID, LEVOTHROID) 50 MCG tablet Take 50 mcg by mouth daily before breakfast. Brand Name only   losartan (COZAAR) 25 MG tablet Take 25  mg by mouth daily.   Magnesium 250 MG TABS Take 1 tablet by mouth daily.   Melatonin 5 MG TABS Take 1 tablet by mouth at bedtime. For sleep   Multiple Vitamins-Minerals (MULTIVITAMIN WITH MINERALS) tablet Take 1 tablet by mouth daily.   OXYGEN Inhale into the lungs. 2 litre at night   pantoprazole (PROTONIX) 40 MG tablet Take 1 tablet (40 mg total) by mouth 2 (two) times daily.   pyridoxine (B-6) 100 MG tablet Take 100 mg by mouth daily.   sodium chloride (OCEAN) 0.65 % SOLN nasal spray Place 1 spray into both nostrils 2 (two) times daily.   sucralfate (CARAFATE) 1 g tablet Take 3 times daily with meals.   Vitamin A 7.5 MG (25000 UT) CAPS Take 1 tablet by mouth 2 (two) times daily.   zinc gluconate 50 MG tablet Take 50 mg by mouth daily.   No facility-administered encounter medications on file as of 10/02/2021.      Medical History: Past Medical History:  Diagnosis Date   Arthritis    osteo   Asthma    needs rescue inhaler few times a year   Claustrophobia 04/21/2020   Depression    Dizziness    light headed spells but it has been a while   Dysrhythmia    tachycardia.(cardizem). brady during procedures   Fibromyalgia    GERD (gastroesophageal reflux disease)    gastritis   Headache(784.0)    Heart murmur 2019   aortic. dr. Nehemiah Massed not worried about this   Hyperlipidemia    Hypertension    Hypothyroidism    Macular degeneration    Migraine    Orthopnea    Pneumonia    long time ago (greater than 5 years ago)   PONV (postoperative nausea and vomiting)    very anxious during cataract surgery. brady during colonoscopy   Shortness of breath    any exertion, cannot lie flat     Vital Signs: BP (!) 142/68 Comment: 148/69   Pulse 89    Temp 98.5 F (36.9 C)    Resp 16    Ht 5' 0.2" (1.529 m)    Wt 155 lb 6.4 oz (70.5 kg)    SpO2 99%    BMI 30.15 kg/m    Review of Systems  Constitutional:  Negative for fatigue and fever.  HENT:  Negative for congestion, mouth sores  and postnasal drip.   Respiratory:  Positive for shortness of breath. Negative for cough and wheezing.   Cardiovascular:  Positive for leg swelling. Negative for chest pain.  Genitourinary:  Positive for dysuria, frequency and urgency. Negative for flank pain.  Psychiatric/Behavioral: Negative.     Physical Exam Vitals and nursing note reviewed.  Constitutional:      General: She is not in acute distress.    Appearance: She is well-developed. She is  not diaphoretic.  HENT:     Head: Normocephalic and atraumatic.     Mouth/Throat:     Pharynx: No oropharyngeal exudate.  Eyes:     Pupils: Pupils are equal, round, and reactive to light.  Neck:     Thyroid: No thyromegaly.     Vascular: No JVD.     Trachea: No tracheal deviation.  Cardiovascular:     Rate and Rhythm: Normal rate and regular rhythm.     Heart sounds: Normal heart sounds. No murmur heard.   No friction rub. No gallop.  Pulmonary:     Effort: Pulmonary effort is normal. No respiratory distress.     Breath sounds: No wheezing or rales.  Chest:     Chest wall: No tenderness.  Abdominal:     General: Bowel sounds are normal.     Palpations: Abdomen is soft.     Tenderness: There is no abdominal tenderness.  Musculoskeletal:        General: Normal range of motion.     Cervical back: Normal range of motion and neck supple.     Right lower leg: Edema present.     Left lower leg: Edema present.  Lymphadenopathy:     Cervical: No cervical adenopathy.  Skin:    General: Skin is warm and dry.  Neurological:     Mental Status: She is alert and oriented to person, place, and time.     Cranial Nerves: No cranial nerve deficit.  Psychiatric:        Behavior: Behavior normal.        Thought Content: Thought content normal.        Judgment: Judgment normal.      Assessment/Plan: 1. Benign essential HTN Mildly elevated, Will continue current medications and has follow-up with cardiology next week to discuss  potentially changing losartan  2. Pain in both lower extremities Will refer to vascular surgery for further evaluation and management - Ambulatory referral to Vascular Surgery  3. Bilateral leg edema Refer to vascular surgery for further evaluation and management especially given worsening swelling on the left with negative ultrasound for DVT - Ambulatory referral to Vascular Surgery  4. Dysuria - POCT Urinalysis Dipstick - CULTURE, URINE COMPREHENSIVE   General Counseling: Angella verbalizes understanding of the findings of todays visit and agrees with plan of treatment. I have discussed any further diagnostic evaluation that may be needed or ordered today. We also reviewed her medications today. she has been encouraged to call the office with any questions or concerns that should arise related to todays visit.    Counseling:    Orders Placed This Encounter  Procedures   CULTURE, URINE COMPREHENSIVE   Ambulatory referral to Vascular Surgery   POCT Urinalysis Dipstick    No orders of the defined types were placed in this encounter.   Time spent:30 Minutes

## 2021-10-03 ENCOUNTER — Telehealth: Payer: Self-pay

## 2021-10-03 ENCOUNTER — Other Ambulatory Visit: Payer: Self-pay

## 2021-10-03 MED ORDER — AMOXICILLIN-POT CLAVULANATE 875-125 MG PO TABS: 1.0000 | ORAL_TABLET | Freq: Two times a day (BID) | ORAL | 0 refills | Status: DC

## 2021-10-03 NOTE — Telephone Encounter (Signed)
pt called today and advised that she feels that her UTI has gotten worse and she is having bladder cramping, urgency, and urine is cloudy.  She has medication for the bladder cramping but she is requesting for amoxicillin to be sent into [facility].  Spoke to [facility] and she sent in amoxicillin 875 to pt's pharmacy.  Pt notified

## 2021-10-07 ENCOUNTER — Telehealth: Payer: Self-pay

## 2021-10-07 LAB — CULTURE, URINE COMPREHENSIVE

## 2021-10-07 NOTE — Telephone Encounter (Signed)
Pt informed that her culture showed mixed  urogenital flora, no acute infection           Pt informed that if she feels it is not resolving that she can call us back and Lauren may need to see her again and possibly make a referal to urology

## 2021-10-07 NOTE — Telephone Encounter (Signed)
-----   Message from Mylinda Latina, PA-C sent at 10/07/2021 12:08 PM EST ----- Please let her know that her urine just showed mixed urogenital flora, no acute infection

## 2021-10-08 DIAGNOSIS — R079 Chest pain, unspecified: Secondary | ICD-10-CM | POA: Diagnosis not present

## 2021-10-08 DIAGNOSIS — Z20822 Contact with and (suspected) exposure to covid-19: Secondary | ICD-10-CM | POA: Diagnosis not present

## 2021-10-08 DIAGNOSIS — R001 Bradycardia, unspecified: Secondary | ICD-10-CM | POA: Diagnosis not present

## 2021-10-08 DIAGNOSIS — R9431 Abnormal electrocardiogram [ECG] [EKG]: Secondary | ICD-10-CM | POA: Diagnosis not present

## 2021-10-08 DIAGNOSIS — I10 Essential (primary) hypertension: Secondary | ICD-10-CM | POA: Diagnosis not present

## 2021-10-08 DIAGNOSIS — I493 Ventricular premature depolarization: Secondary | ICD-10-CM | POA: Diagnosis not present

## 2021-10-17 ENCOUNTER — Other Ambulatory Visit: Payer: Self-pay

## 2021-10-17 ENCOUNTER — Encounter
Admission: RE | Admit: 2021-10-17 | Discharge: 2021-10-17 | Disposition: A | Discharge status: Home / Self Care | Payer: Medicare Other | Source: Ambulatory Visit | Attending: Gastroenterology | Admitting: Gastroenterology

## 2021-10-17 DIAGNOSIS — K219 Gastro-esophageal reflux disease without esophagitis: Secondary | ICD-10-CM | POA: Insufficient documentation

## 2021-10-17 DIAGNOSIS — R6881 Early satiety: Secondary | ICD-10-CM | POA: Diagnosis not present

## 2021-10-17 DIAGNOSIS — R112 Nausea with vomiting, unspecified: Secondary | ICD-10-CM | POA: Diagnosis not present

## 2021-10-17 DIAGNOSIS — R110 Nausea: Secondary | ICD-10-CM | POA: Insufficient documentation

## 2021-10-17 MED ORDER — TECHNETIUM TC 99M SULFUR COLLOID
2.5000 | Freq: Once | INTRAVENOUS | Status: AC
  Administered 2021-10-17: 2.5 via ORAL

## 2021-10-20 ENCOUNTER — Other Ambulatory Visit: Payer: Self-pay | Admitting: Gastroenterology

## 2021-10-20 DIAGNOSIS — K219 Gastro-esophageal reflux disease without esophagitis: Secondary | ICD-10-CM

## 2021-10-20 DIAGNOSIS — R110 Nausea: Secondary | ICD-10-CM

## 2021-10-20 DIAGNOSIS — R6881 Early satiety: Secondary | ICD-10-CM

## 2021-10-20 DIAGNOSIS — R1013 Epigastric pain: Secondary | ICD-10-CM

## 2021-11-03 ENCOUNTER — Other Ambulatory Visit: Payer: Self-pay

## 2021-11-03 ENCOUNTER — Ambulatory Visit
Admission: RE | Admit: 2021-11-03 | Discharge: 2021-11-03 | Disposition: A | Discharge status: Home / Self Care | Payer: Medicare Other | Source: Ambulatory Visit | Attending: Gastroenterology | Admitting: Gastroenterology

## 2021-11-03 DIAGNOSIS — K219 Gastro-esophageal reflux disease without esophagitis: Secondary | ICD-10-CM | POA: Insufficient documentation

## 2021-11-03 DIAGNOSIS — K760 Fatty (change of) liver, not elsewhere classified: Secondary | ICD-10-CM | POA: Diagnosis not present

## 2021-11-03 DIAGNOSIS — R110 Nausea: Secondary | ICD-10-CM | POA: Diagnosis not present

## 2021-11-03 DIAGNOSIS — R6881 Early satiety: Secondary | ICD-10-CM | POA: Insufficient documentation

## 2021-11-03 DIAGNOSIS — R1013 Epigastric pain: Secondary | ICD-10-CM | POA: Diagnosis not present

## 2021-11-03 DIAGNOSIS — K820 Obstruction of gallbladder: Secondary | ICD-10-CM | POA: Diagnosis not present

## 2021-11-03 MED ORDER — IOHEXOL 300 MG/ML  SOLN
100.0000 mL | Freq: Once | INTRAMUSCULAR | Status: AC | PRN
  Administered 2021-11-03: 100 mL via INTRAVENOUS

## 2021-11-10 ENCOUNTER — Encounter: Payer: Self-pay | Admitting: Nurse Practitioner

## 2021-11-10 ENCOUNTER — Ambulatory Visit (INDEPENDENT_AMBULATORY_CARE_PROVIDER_SITE_OTHER): Payer: Medicare Other | Admitting: Nurse Practitioner

## 2021-11-10 VITALS — BP 130/68 | HR 85 | Temp 98.6°F | Resp 16 | Ht 61.0 in | Wt 152.0 lb

## 2021-11-10 DIAGNOSIS — J4520 Mild intermittent asthma, uncomplicated: Secondary | ICD-10-CM | POA: Diagnosis not present

## 2021-11-10 DIAGNOSIS — R0602 Shortness of breath: Secondary | ICD-10-CM

## 2021-11-10 DIAGNOSIS — I10 Essential (primary) hypertension: Secondary | ICD-10-CM

## 2021-11-10 MED ORDER — ALBUTEROL SULFATE HFA 108 (90 BASE) MCG/ACT IN AERS: 2.0000 | INHALATION_SPRAY | RESPIRATORY_TRACT | 5 refills | Status: DC | PRN

## 2021-11-10 MED ORDER — ALBUTEROL SULFATE (2.5 MG/3ML) 0.083% IN NEBU: 2.5000 mg | INHALATION_SOLUTION | Freq: Four times a day (QID) | RESPIRATORY_TRACT | 3 refills | Status: DC | PRN

## 2021-11-10 NOTE — Progress Notes (Signed)
[facility] ?[address] ?[place], [place] [address] ? ?Internal MEDICINE  ?Office Visit Note ? ?Patient Name: Vanessa Evans ? [id number]  ?[id number] ? ?Date of Service: 11/10/2021 ? ?Chief Complaint  ?Patient presents with  ? Follow-up  ? Asthma  ? ? ?HPI ?Vanessa Evans presents for follow-up visit for asthma.  Spirometry done in office today and it was normal which is consistent with her previous spirometry and there is no significant change from her last spirometry in October 2022.  She does not use a maintenance inhaler.  She does use a rescue inhaler as needed and has nebulizer treatments as needed.  She does need refills of those so that they are available if she needs to refill ?Patient denies any shortness of breath, cough or wheezing at this time.  She states that her asthma has been stable with no complications or recent respiratory infections. ? ?Current Medication: ?Outpatient Encounter Medications as of 11/10/2021  ?Medication Sig  ? acetaminophen (TYLENOL) 500 MG tablet Take 1,000 mg by mouth 2 (two) times daily as needed for moderate pain.  ? amoxicillin-clavulanate (AUGMENTIN) 875-125 MG tablet Take 1 tablet by mouth 2 (two) times daily. For 7 days  ? aspirin EC 81 MG tablet Take 81 mg by mouth at bedtime.   ? benzonatate (TESSALON) 100 MG capsule Take 2 capsules (200 mg total) by mouth 2 (two) times daily as needed for cough.  ? BLACK ELDERBERRY PO Take 1 Dose by mouth 2 (two) times a day.  ? Calcium Carbonate-Vitamin D 600-400 MG-UNIT tablet Take 1 tablet by mouth 2 (two) times daily.  ? cholecalciferol (VITAMIN D) 1000 UNITS tablet Take 1,000 Units by mouth at bedtime.   ? citalopram (CELEXA) 10 MG tablet Take 1 tablet (10 mg total) by mouth daily.  ? cyanocobalamin 500 MCG tablet Take 500 mcg by mouth daily.  ? Dextran 70-Hypromellose (ARTIFICIAL TEARS) 0.1-0.3 % SOLN Apply 1-2 drops to eye daily.  ? diclofenac Sodium (VOLTAREN) 1 % GEL Apply 4 g topically 4 (four) times daily.  ? diltiazem  (CARDIZEM CD) 120 MG 24 hr capsule Take 120 mg by mouth at bedtime.   ? diphenhydrAMINE (BENADRYL) 25 MG tablet Take 25-50 mg by mouth daily as needed for itching.  ? docusate sodium (COLACE) 100 MG capsule Take 200 mg by mouth at bedtime as needed for moderate constipation.   ? EPINEPHrine (EPIPEN 2-PAK) 0.3 mg/0.3 mL IJ SOAJ injection use as directed for severe allergy reaction  ? fenofibrate 160 MG tablet TAKE 1 TABLET DAILY FOR CHOLESTEROL  ? fluticasone (FLONASE) 50 MCG/ACT nasal spray Use 2 sprays in each nostril daily,  ? folic acid (FOLVITE) 676 MCG tablet Take 400 mcg by mouth daily.  ? guaiFENesin (MUCINEX) 600 MG 12 hr tablet Take 600 mg by mouth at bedtime as needed (congestion).  ? Hypromellose (ARTIFICIAL TEARS OP) Place 1-2 drops into both eyes daily as needed (dry eyes).  ? ketotifen (ZADITOR) 0.025 % ophthalmic solution Place 1 drop into both eyes 2 (two) times daily as needed (allergies).  ? levocetirizine (XYZAL) 5 MG tablet Take 1 tablet (5 mg total) by mouth every evening.  ? levothyroxine (SYNTHROID, LEVOTHROID) 50 MCG tablet Take 50 mcg by mouth daily before breakfast. Brand Name only  ? losartan (COZAAR) 25 MG tablet Take 25 mg by mouth daily.  ? Magnesium 250 MG TABS Take 1 tablet by mouth daily.  ? Melatonin 5 MG TABS Take 1 tablet by mouth at bedtime. For sleep  ?  Multiple Vitamins-Minerals (MULTIVITAMIN WITH MINERALS) tablet Take 1 tablet by mouth daily.  ? OXYGEN Inhale into the lungs. 2 litre at night  ? pantoprazole (PROTONIX) 40 MG tablet Take 1 tablet (40 mg total) by mouth 2 (two) times daily.  ? pyridoxine (B-6) 100 MG tablet Take 100 mg by mouth daily.  ? sodium chloride (OCEAN) 0.65 % SOLN nasal spray Place 1 spray into both nostrils 2 (two) times daily.  ? sucralfate (CARAFATE) 1 g tablet Take 3 times daily with meals.  ? Vitamin A 7.5 MG (25000 UT) CAPS Take 1 tablet by mouth 2 (two) times daily.  ? zinc gluconate 50 MG tablet Take 50 mg by mouth daily.  ? [DISCONTINUED]  albuterol (PROVENTIL) (2.5 MG/3ML) 0.083% nebulizer solution Take 3 mLs (2.5 mg total) by nebulization every 6 (six) hours as needed for wheezing or shortness of breath.  ? [DISCONTINUED] albuterol (VENTOLIN HFA) 108 (90 Base) MCG/ACT inhaler Inhale 2 puffs into the lungs every 4 (four) hours as needed for wheezing or shortness of breath.  ? albuterol (PROVENTIL) (2.5 MG/3ML) 0.083% nebulizer solution Take 3 mLs (2.5 mg total) by nebulization every 6 (six) hours as needed for wheezing or shortness of breath.  ? albuterol (VENTOLIN HFA) 108 (90 Base) MCG/ACT inhaler Inhale 2 puffs into the lungs every 4 (four) hours as needed for wheezing or shortness of breath.  ? ?No facility-administered encounter medications on file as of 11/10/2021.  ? ? ?Surgical History: ?Past Surgical History:  ?Procedure Laterality Date  ? BACK SURGERY  2014  ? removed bone to get to a benign tumor that was pushing on spinal column  ? BIOPSY THYROID  2018  ? bladder biopsies  2003  ? 9 biopsies done by dr. cope.  all benign.  inflammatory process going on in bladder  ? BREAST BIOPSY Right   ? benign  ? BREAST SURGERY Right   ? lumpectomy  ? CATARACT EXTRACTION W/PHACO Left 12/25/2014  ? Procedure: CATARACT EXTRACTION PHACO AND INTRAOCULAR LENS PLACEMENT (IOC);  Surgeon: Birder Robson, MD;  Location: [facility] ORS;  Service: Ophthalmology;  Laterality: Left;  [place] 00:32 ?AP% 20.0 ?CDE 6.49  ? COLONOSCOPY W/ BIOPSIES    ? COLONOSCOPY W/ POLYPECTOMY  2002, 2004, 2005  ? adenomatous polyps removed  ? COLONOSCOPY WITH PROPOFOL N/A 08/23/2017  ? Procedure: COLONOSCOPY WITH PROPOFOL;  Surgeon: Manya Silvas, MD;  Location: [facility] ENDOSCOPY;  Service: Endoscopy;  Laterality: N/A;  ? COLONOSCOPY WITH PROPOFOL N/A 11/08/2017  ? Procedure: COLONOSCOPY WITH PROPOFOL;  Surgeon: Manya Silvas, MD;  Location: [facility] ENDOSCOPY;  Service: Endoscopy;  Laterality: N/A;  ? COLONOSCOPY WITH PROPOFOL N/A 06/30/2021  ? Procedure: COLONOSCOPY WITH PROPOFOL;  Surgeon:  Lesly Rubenstein, MD;  Location: [facility] ENDOSCOPY;  Service: Endoscopy;  Laterality: N/A;  ? CYSTOCELE REPAIR N/A 04/09/2016  ? Procedure: ANTERIOR REPAIR (CYSTOCELE);  Surgeon: Gae Dry, MD;  Location: [facility] ORS;  Service: Gynecology;  Laterality: N/A;  ? DIAGNOSTIC LAPAROSCOPY  2008  ? removed both ovaries with cysts, tubes and fibroids  ? [place] OF UTERUS  1973  ? ESOPHAGOGASTRODUODENOSCOPY (EGD) WITH PROPOFOL N/A 08/23/2017  ? Procedure: ESOPHAGOGASTRODUODENOSCOPY (EGD) WITH PROPOFOL;  Surgeon: Manya Silvas, MD;  Location: [facility] ENDOSCOPY;  Service: Endoscopy;  Laterality: N/A;  ? ESOPHAGOGASTRODUODENOSCOPY (EGD) WITH PROPOFOL N/A 06/30/2021  ? Procedure: ESOPHAGOGASTRODUODENOSCOPY (EGD) WITH PROPOFOL;  Surgeon: Lesly Rubenstein, MD;  Location: [facility] ENDOSCOPY;  Service: Endoscopy;  Laterality: N/A;  ? EYE SURGERY Bilateral 2016  ?  HERNIA REPAIR Right 2008  ? Inguinal hernia Repair, ventral hernia repair  ? JOINT REPLACEMENT Right 2008  ? knee  ? KNEE ARTHROSCOPY Right   ? LUMBAR LAMINECTOMY/DECOMPRESSION MICRODISCECTOMY Right 03/21/2013  ? Procedure: Right Lumbar five-Sacral one Laminectomy for Synovial Cyst;  Surgeon: Faythe Ghee, MD;  Location: [facility] NEURO ORS;  Service: Neurosurgery;  Laterality: Right;  right  ? OOPHORECTOMY    ? REVERSE SHOULDER ARTHROPLASTY Right 02/22/2018  ? Procedure: REVERSE SHOULDER ARTHROPLASTY;  Surgeon: Corky Mull, MD;  Location: [facility] ORS;  Service: Orthopedics;  Laterality: Right;  ? TUBAL LIGATION    ? UNILATERAL SALPINGECTOMY Left 04/09/2016  ? Procedure: UNILATERAL SALPINGECTOMY;  Surgeon: Gae Dry, MD;  Location: [facility] ORS;  Service: Gynecology;  Laterality: Left;  ? UPPER GI ENDOSCOPY  2010  ? with biopsy of gastric erosion  ? VAGINAL HYSTERECTOMY N/A 04/09/2016  ? Procedure: HYSTERECTOMY VAGINAL;  Surgeon: Gae Dry, MD;  Location: [facility] ORS;  Service: Gynecology;  Laterality: N/A;  ? VAGINAL HYSTERECTOMY  2017  ? and bladder tack   ? ? ?Medical History: ?Past Medical History:  ?Diagnosis Date  ? Arthritis   ? osteo  ? Asthma   ? needs rescue inhaler few times a year  ? Claustrophobia 04/21/2020  ? Depression   ? Dizziness   ? light headed spe

## 2021-11-19 ENCOUNTER — Other Ambulatory Visit (INDEPENDENT_AMBULATORY_CARE_PROVIDER_SITE_OTHER): Payer: Self-pay | Admitting: Nurse Practitioner

## 2021-11-19 DIAGNOSIS — R600 Localized edema: Secondary | ICD-10-CM

## 2021-11-22 ENCOUNTER — Ambulatory Visit
Admission: EM | Admit: 2021-11-22 | Discharge: 2021-11-22 | Disposition: A | Discharge status: Home / Self Care | Payer: Medicare Other | Attending: Physician Assistant | Admitting: Physician Assistant

## 2021-11-22 ENCOUNTER — Other Ambulatory Visit: Payer: Self-pay

## 2021-11-22 DIAGNOSIS — I10 Essential (primary) hypertension: Secondary | ICD-10-CM | POA: Diagnosis not present

## 2021-11-22 DIAGNOSIS — D72829 Elevated white blood cell count, unspecified: Secondary | ICD-10-CM | POA: Diagnosis not present

## 2021-11-22 DIAGNOSIS — Z87891 Personal history of nicotine dependence: Secondary | ICD-10-CM | POA: Diagnosis not present

## 2021-11-22 DIAGNOSIS — Z7289 Other problems related to lifestyle: Secondary | ICD-10-CM | POA: Diagnosis not present

## 2021-11-22 DIAGNOSIS — R519 Headache, unspecified: Secondary | ICD-10-CM | POA: Diagnosis not present

## 2021-11-22 DIAGNOSIS — K5792 Diverticulitis of intestine, part unspecified, without perforation or abscess without bleeding: Secondary | ICD-10-CM | POA: Diagnosis not present

## 2021-11-22 DIAGNOSIS — R5383 Other fatigue: Secondary | ICD-10-CM | POA: Diagnosis not present

## 2021-11-22 DIAGNOSIS — R1032 Left lower quadrant pain: Secondary | ICD-10-CM | POA: Diagnosis not present

## 2021-11-22 DIAGNOSIS — R1031 Right lower quadrant pain: Secondary | ICD-10-CM | POA: Diagnosis not present

## 2021-11-22 DIAGNOSIS — Z6828 Body mass index (BMI) 28.0-28.9, adult: Secondary | ICD-10-CM | POA: Diagnosis not present

## 2021-11-22 DIAGNOSIS — K5732 Diverticulitis of large intestine without perforation or abscess without bleeding: Secondary | ICD-10-CM | POA: Diagnosis not present

## 2021-11-22 DIAGNOSIS — R197 Diarrhea, unspecified: Secondary | ICD-10-CM | POA: Insufficient documentation

## 2021-11-22 DIAGNOSIS — Z96651 Presence of right artificial knee joint: Secondary | ICD-10-CM | POA: Diagnosis not present

## 2021-11-22 DIAGNOSIS — R1030 Lower abdominal pain, unspecified: Secondary | ICD-10-CM | POA: Diagnosis not present

## 2021-11-22 DIAGNOSIS — R109 Unspecified abdominal pain: Secondary | ICD-10-CM | POA: Diagnosis not present

## 2021-11-22 DIAGNOSIS — Z90722 Acquired absence of ovaries, bilateral: Secondary | ICD-10-CM | POA: Diagnosis not present

## 2021-11-22 DIAGNOSIS — R509 Fever, unspecified: Secondary | ICD-10-CM | POA: Diagnosis not present

## 2021-11-22 DIAGNOSIS — J449 Chronic obstructive pulmonary disease, unspecified: Secondary | ICD-10-CM | POA: Diagnosis not present

## 2021-11-22 LAB — URINALYSIS, ROUTINE W REFLEX MICROSCOPIC
Bilirubin Urine: NEGATIVE
Glucose, UA: NEGATIVE mg/dL
Hgb urine dipstick: NEGATIVE
Ketones, ur: NEGATIVE mg/dL
Leukocytes,Ua: NEGATIVE
Nitrite: NEGATIVE
Protein, ur: NEGATIVE mg/dL
Specific Gravity, Urine: 1.01 (ref 1.005–1.030)
pH: 7 (ref 5.0–8.0)

## 2021-11-22 LAB — CBC WITH DIFFERENTIAL/PLATELET
Abs Immature Granulocytes: 0.04 10*3/uL (ref 0.00–0.07)
Basophils Absolute: 0.1 10*3/uL (ref 0.0–0.1)
Basophils Relative: 0 %
Eosinophils Absolute: 0.2 10*3/uL (ref 0.0–0.5)
Eosinophils Relative: 2 %
HCT: 34.3 % — ABNORMAL LOW (ref 36.0–46.0)
Hemoglobin: 11 g/dL — ABNORMAL LOW (ref 12.0–15.0)
Immature Granulocytes: 0 %
Lymphocytes Relative: 15 %
Lymphs Abs: 1.8 10*3/uL (ref 0.7–4.0)
MCH: 28.3 pg (ref 26.0–34.0)
MCHC: 32.1 g/dL (ref 30.0–36.0)
MCV: 88.2 fL (ref 80.0–100.0)
Monocytes Absolute: 1.3 10*3/uL — ABNORMAL HIGH (ref 0.1–1.0)
Monocytes Relative: 12 %
Neutro Abs: 8.1 10*3/uL — ABNORMAL HIGH (ref 1.7–7.7)
Neutrophils Relative %: 71 %
Platelets: 316 10*3/uL (ref 150–400)
RBC: 3.89 MIL/uL (ref 3.87–5.11)
RDW: 13.2 % (ref 11.5–15.5)
WBC: 11.5 10*3/uL — ABNORMAL HIGH (ref 4.0–10.5)
nRBC: 0 % (ref 0.0–0.2)

## 2021-11-22 LAB — COMPREHENSIVE METABOLIC PANEL
ALT: 25 U/L (ref 0–44)
AST: 27 U/L (ref 15–41)
Albumin: 4.2 g/dL (ref 3.5–5.0)
Alkaline Phosphatase: 66 U/L (ref 38–126)
Anion gap: 9 (ref 5–15)
BUN: 15 mg/dL (ref 8–23)
CO2: 27 mmol/L (ref 22–32)
Calcium: 9.8 mg/dL (ref 8.9–10.3)
Chloride: 99 mmol/L (ref 98–111)
Creatinine, Ser: 0.67 mg/dL (ref 0.44–1.00)
GFR, Estimated: >60 mL/min (ref >60)
Glucose, Bld: 103 mg/dL — ABNORMAL HIGH (ref 70–99)
Potassium: 4 mmol/L (ref 3.5–5.1)
Sodium: 135 mmol/L (ref 135–145)
Total Bilirubin: 0.4 mg/dL (ref 0.3–1.2)
Total Protein: 7.8 g/dL (ref 6.5–8.1)

## 2021-11-22 NOTE — Discharge Instructions (Addendum)
-  Your white blood cell count is elevated quite a bit from the last time you had it done less than 2 months ago. ?- Your hemoglobin is also a little bit decreased which could signify possible bleeding. ?- Given your history, I do have concerns for diverticulitis.  Other possibilities could include infectious diarrhea such as C. difficile secondary to the antibiotics that you had a couple months ago, appendicitis since you have a lot of pain in the right lower quadrant.  There are other emergent abdominal conditions that could be the cause of your symptoms as well.  At this time, given your lab work I recommend you go to the emergency department for a CT scan. ? ?You have been advised to follow up immediately in the emergency department for concerning signs.symptoms. If you declined EMS transport, please have a family member take you directly to the ED at this time. Do not delay. Based on concerns about condition, if you do not follow up in th e ED, you may risk poor outcomes including worsening of condition, delayed treatment and potentially life threatening issues. If you have declined to go to the ED at this time, you should call your PCP immediately to set up a follow up appointment. ? ?Go to ED for red flag symptoms, including; fevers you cannot reduce with Tylenol/Motrin, severe headaches, vision changes, numbness/weakness in part of the body, lethargy, confusion, intractable vomiting, severe dehydration, chest pain, breathing difficulty, severe persistent abdominal or pelvic pain, signs of severe infection (increased redness, swelling of an area), feeling faint or passing out, dizziness, etc. You should especially go to the ED for sudden acute worsening of condition if you do not elect to go at this time.  ?

## 2021-11-22 NOTE — ED Triage Notes (Signed)
Pt c/o body chills, body aches, loss of appetite, fatigue, temperature of 100, Diarrhea x4days ? ?Pt denies any respiratory symptoms ?

## 2021-11-22 NOTE — ED Provider Notes (Signed)
?[place] [facility] ? ? ? ?CSN: [id number] ?Arrival date & time: 11/22/21  1432 ? ? ?  ? ?History   ?Chief Complaint ?Chief Complaint  ?Patient presents with  ? Diarrhea  ? Generalized Body Aches  ? ? ?HPI ?Vanessa Evans is a 76 y.o. female presenting for approximately 4-day history of fatigue, chills, body aches, decreased appetite and multiple episodes of diarrhea today.  Also reports low-grade temp of 100 degrees a couple of days ago.  She does not think she has had a temperature today.  Current temp is 97.9 degrees.  Patient says she has only had 1 episode of diarrhea today.  That is decreased from when it started.  She does report another bowel movement that she had today with semiformed stool and mucus.  No blood or black stools.  Reports some lower abdominal cramping but no severe pain.  She says its about 4 out of 10.  Patient also reports of urinary urgency which began today but denies dysuria or frequency.  Would like to be assessed for possible UTI.  She says she does not always have typical symptoms.  Patient denies any high-grade fever, cough, congestion, breathing difficulty or vomiting.  Has not been taking any antidiarrhea medications or laxatives.  Patient does have a history of GI conditions including GERD, PUD and IBS.  She also says she has had diverticulosis in the past.  Patient had a CT scan about 3 weeks ago for nausea and GERD as well as epigastric pain and early satiety.  No significant abnormalities.  Patient denies any sick contacts presently.  Does not report eating any uncooked or spoiled foods.  No recent travel and no recent antibiotic use.  Review of medical record shows patient was prescribed Augmentin about 2 months ago.  No other complaints. ? ?Other medical history significant for asthma, fibromyalgia, hypertension, hyperlipidemia, hypothyroidism, migraines, osteoarthritis, osteoporosis. ? ?Colonoscopy from November 2022: Shows multiple small 2 mm polyps, internal  hemorrhoids and "Multiple small and large-mouthed diverticula were found in the sigmoid colon and descending colon."  ? ?HPI ? ?Past Medical History:  ?Diagnosis Date  ? Arthritis   ? osteo  ? Asthma   ? needs rescue inhaler few times a year  ? Claustrophobia 04/21/2020  ? Depression   ? Dizziness   ? light headed spells but it has been a while  ? Dysrhythmia   ? tachycardia.(cardizem). brady during procedures  ? Fibromyalgia   ? GERD (gastroesophageal reflux disease)   ? gastritis  ? Headache(784.0)   ? Heart murmur 2019  ? aortic. dr. Nehemiah Massed not worried about this  ? Hyperlipidemia   ? Hypertension   ? Hypothyroidism   ? Macular degeneration   ? Migraine   ? Orthopnea   ? Pneumonia   ? long time ago (greater than 5 years ago)  ? PONV (postoperative nausea and vomiting)   ? very anxious during cataract surgery. brady during colonoscopy  ? Shortness of breath   ? any exertion, cannot lie flat  ? ? ?Patient Active Problem List  ? Diagnosis Date Noted  ? Coronary artery disease involving native coronary artery of native heart with angina pectoris ([place]) 10/28/2020  ? Peptic ulcer disease 04/21/2020  ? Claustrophobia 04/21/2020  ? Urinary tract infection without hematuria 02/04/2020  ? Pain in both lower extremities 12/18/2019  ? Inflammatory polyarthritis ([place]) 07/26/2019  ? Excessive daytime sleepiness 07/26/2019  ? Encounter for screening mammogram for malignant neoplasm of breast 07/26/2019  ?  Intercostal muscle pain 06/25/2019  ? Positive ANA (antinuclear antibody) 05/01/2019  ? Primary osteoarthritis involving multiple joints 04/26/2019  ? Mild intermittent asthma without complication 72/53/6644  ? Seasonal allergic rhinitis due to pollen 08/22/2018  ? Chills with fever 08/11/2018  ? Sore throat 08/11/2018  ? Acute upper respiratory infection 08/11/2018  ? Candidiasis 08/11/2018  ? Iron deficiency anemia 05/05/2018  ? Right hip pain 04/27/2018  ? Accidental fall from furniture 04/27/2018  ? Vitamin D deficiency  04/27/2018  ? Cough 04/18/2018  ? Dysuria 04/18/2018  ? Status post reverse total shoulder replacement, right 02/22/2018  ? Abnormal ECG 02/18/2018  ? Pain in joint of right shoulder 02/13/2018  ? Abdominal aortic atherosclerosis ([place]) 02/13/2018  ? Episode of recurrent major depressive disorder ([place]) 02/13/2018  ? Encounter for general adult medical examination with abnormal findings 02/13/2018  ? Lipoma of right shoulder 01/31/2018  ? Rotator cuff tendinitis, right 01/31/2018  ? Chronic superficial gastritis without bleeding 12/12/2017  ? Schatzki's ring 12/12/2017  ? Acute gastritis 11/08/2017  ? Nausea 10/18/2017  ? Other fatigue 10/18/2017  ? Allergic rhinitis 09/20/2017  ? Eczema 09/20/2017  ? Hematuria 09/20/2017  ? Mixed hyperlipidemia 09/20/2017  ? Osteoarthritis 09/20/2017  ? Osteoporosis, post-menopausal 09/20/2017  ? Palpitations 07/05/2017  ? Epigastric pain 05/24/2017  ? Gastroesophageal reflux disease without esophagitis 05/24/2017  ? Impingement syndrome of left shoulder 10/02/2016  ? Trochanteric bursitis of left hip 10/02/2016  ? SOB (shortness of breath) 04/16/2016  ? Uterine prolapse 04/09/2016  ? Cystocele 04/09/2016  ? Hyponatremia 04/09/2016  ? Benign essential HTN 11/18/2015  ? Paroxysmal supraventricular tachycardia ([place]) 11/18/2015  ? Premature ventricular contraction 07/20/2014  ? History of colonic polyps 05/29/2014  ? Asthma 02/05/2014  ? Chest pain 02/05/2014  ? Increased frequency of urination 02/05/2014  ? Tachycardia 02/05/2014  ? Female stress incontinence 12/14/2013  ? Gross hematuria 12/14/2013  ? Incomplete emptying of bladder 12/14/2013  ? Other chronic cystitis without hematuria 12/14/2013  ? ? ?Past Surgical History:  ?Procedure Laterality Date  ? BACK SURGERY  2014  ? removed bone to get to a benign tumor that was pushing on spinal column  ? BIOPSY THYROID  2018  ? bladder biopsies  2003  ? 9 biopsies done by dr. cope.  all benign.  inflammatory process going on in bladder  ?  BREAST BIOPSY Right   ? benign  ? BREAST SURGERY Right   ? lumpectomy  ? CATARACT EXTRACTION W/PHACO Left 12/25/2014  ? Procedure: CATARACT EXTRACTION PHACO AND INTRAOCULAR LENS PLACEMENT (IOC);  Surgeon: Birder Robson, MD;  Location: [facility] ORS;  Service: Ophthalmology;  Laterality: Left;  [place] 00:32 ?AP% 20.0 ?CDE 6.49  ? COLONOSCOPY W/ BIOPSIES    ? COLONOSCOPY W/ POLYPECTOMY  2002, 2004, 2005  ? adenomatous polyps removed  ? COLONOSCOPY WITH PROPOFOL N/A 08/23/2017  ? Procedure: COLONOSCOPY WITH PROPOFOL;  Surgeon: Manya Silvas, MD;  Location[facility] END[facility]COPY;  Service: Endoscopy;  Laterality: N/A;  ? COLONOSCOPY WITH PROPOFOL N/A 11/08/2017  ? Procedure: COLONOSCOPY WITH PROPOFOL;  Surgeon: Manya Silvas, MD;  Location: [facility] ENDOSCOPY;  Service: Endoscopy;  Laterality: N/A;  ? COLONOSCOPY WITH PROPOFOL N/A 06/30/2021  ? Procedure: COLONOSCOPY WITH PROPOFOL;  Surgeon: Lesly Rubenstein, MD;  Location: [facility] ENDOSCOPY;  Service: Endoscopy;  Laterality: N/A;  ? CYSTOCELE REPAIR N/A 04/09/2016  ? Procedure: ANTERIOR REPAIR (CYSTOCELE);  Surgeon: Gae Dry, MD;  Location: [facility] ORS;  Service: Gynecology;  Laterality: N/A;  ? DIAGNOSTIC LAPAROSCOPY  2008  ? removed both ovaries with cysts, tubes and fibroids  ? [place] OF UTERUS  1973  ? ESOPHAGOGASTRODUODENOSCOPY (EGD) WITH PROPOFOL N/A 08/23/2017  ? Procedure: ESOPHAGOGASTRODUODENOSCOPY (EGD) WITH PROPOFOL;  Surgeon: Manya Silvas, MD;  Location: [facility] ENDOSCOPY;  Service: Endoscopy;  Laterality: N/A;  ? ESOPHAGOGASTRODUODENOSCOPY (EGD) WITH PROPOFOL N/A 06/30/2021  ? Procedure: ESOPHAGOGASTRODUODENOSCOPY (EGD) WITH PROPOFOL;  Surgeon: Lesly Rubenstein, MD;  Location: [facility] ENDOSCOPY;  Service: Endoscopy;  Laterality: N/A;  ? EYE SURGERY Bilateral 2016  ? HERNIA REPAIR Right 2008  ? Inguinal hernia Repair, ventral hernia repair  ? JOINT REPLACEMENT Right 2008  ? knee  ? KNEE ARTHROSCOPY Right   ? LUMBAR LAMINECTOMY/DECOMPRESSION  MICRODISCECTOMY Right 03/21/2013  ? Procedure: Right Lumbar five-Sacral one Laminectomy for Synovial Cyst;  Surgeon: Faythe Ghee, MD;  Location: [place] NEURO ORS;  Service: Neurosurgery;  Laterality: Right;  right  ? O

## 2021-11-24 ENCOUNTER — Encounter (INDEPENDENT_AMBULATORY_CARE_PROVIDER_SITE_OTHER): Payer: Medicare Other | Admitting: Vascular Surgery

## 2021-11-24 ENCOUNTER — Encounter (INDEPENDENT_AMBULATORY_CARE_PROVIDER_SITE_OTHER): Payer: Medicare Other

## 2021-11-25 DIAGNOSIS — Z20822 Contact with and (suspected) exposure to covid-19: Secondary | ICD-10-CM | POA: Diagnosis not present

## 2021-11-26 DIAGNOSIS — R197 Diarrhea, unspecified: Secondary | ICD-10-CM | POA: Diagnosis not present

## 2021-11-27 ENCOUNTER — Other Ambulatory Visit
Admission: RE | Admit: 2021-11-27 | Discharge: 2021-11-27 | Disposition: A | Discharge status: Home / Self Care | Payer: Medicare Other | Source: Ambulatory Visit | Attending: Gastroenterology | Admitting: Gastroenterology

## 2021-11-27 DIAGNOSIS — R197 Diarrhea, unspecified: Secondary | ICD-10-CM | POA: Insufficient documentation

## 2021-11-27 LAB — GASTROINTESTINAL PANEL BY PCR, STOOL (REPLACES STOOL CULTURE)

## 2021-11-27 LAB — C DIFFICILE QUICK SCREEN W PCR REFLEX
C Diff antigen: NEGATIVE
C Diff interpretation: NOT DETECTED
C Diff toxin: NEGATIVE

## 2021-12-11 ENCOUNTER — Encounter: Payer: Self-pay | Admitting: Physician Assistant

## 2021-12-11 ENCOUNTER — Ambulatory Visit (INDEPENDENT_AMBULATORY_CARE_PROVIDER_SITE_OTHER): Payer: Medicare Other | Admitting: Physician Assistant

## 2021-12-11 VITALS — BP 140/70 | HR 96 | Temp 97.6°F | Resp 16 | Ht 61.0 in | Wt 153.0 lb

## 2021-12-11 DIAGNOSIS — E782 Mixed hyperlipidemia: Secondary | ICD-10-CM

## 2021-12-11 DIAGNOSIS — J4520 Mild intermittent asthma, uncomplicated: Secondary | ICD-10-CM | POA: Diagnosis not present

## 2021-12-11 DIAGNOSIS — K5792 Diverticulitis of intestine, part unspecified, without perforation or abscess without bleeding: Secondary | ICD-10-CM

## 2021-12-11 DIAGNOSIS — I10 Essential (primary) hypertension: Secondary | ICD-10-CM | POA: Diagnosis not present

## 2021-12-11 DIAGNOSIS — F331 Major depressive disorder, recurrent, moderate: Secondary | ICD-10-CM | POA: Diagnosis not present

## 2021-12-11 NOTE — Progress Notes (Signed)
[facility] ?[address] ?[place], [place] [address] ? ?Internal MEDICINE  ?Office Visit Note ? ?Patient Name: Vanessa Evans ? [id number]  ?[id number] ? ?Date of Service: 12/14/2021 ? ?Chief Complaint  ?Patient presents with  ? Follow-up  ? Gastroesophageal Reflux  ? Depression  ? Hyperlipidemia  ? Hypertension  ? Quality Metric Gaps  ?  Pneumonia Vaccine  ? ? ?HPI ?Pt is here for routine follow up ?-Recovering from diverticulitis. Had follow up with GI and they checked some labs and found she did not have cdiff. Does have some diarrhea still but better. ?-Swelling in left leg is improving but also hasnt been up on her feet as much. Saw cardiology and reduced losartan to 1/2 pill and seems to have helped some but will still be seeing vascular to investigate further. Cardiology follow up on June 1st. ?-Discussed BP is borderline in office with reduction of losartan without additional med. She has had side effect to HCTZ before. Discussed considering low dose BB due to help with BP and HR. Will hold off on changes today unless BP starts to rise further, but may discuss with cardiology as well next month ?-mammogram in June ?-BP at home has been 140s ?-Discussed PNA vaccine--has had two previously but unsure which these were and thinks she did have the prevnar20 not long ago and will confirm with pharmacy and let us know date if so ? ?Current Medication: ?Outpatient Encounter Medications as of 12/11/2021  ?Medication Sig  ? acetaminophen (TYLENOL) 500 MG tablet Take 1,000 mg by mouth 2 (two) times daily as needed for moderate pain.  ? albuterol (PROVENTIL) (2.5 MG/3ML) 0.083% nebulizer solution Take 3 mLs (2.5 mg total) by nebulization every 6 (six) hours as needed for wheezing or shortness of breath.  ? albuterol (VENTOLIN HFA) 108 (90 Base) MCG/ACT inhaler Inhale 2 puffs into the lungs every 4 (four) hours as needed for wheezing or shortness of breath.  ? aspirin EC 81 MG tablet Take 81 mg by mouth at  bedtime.   ? BLACK ELDERBERRY PO Take 1 Dose by mouth 2 (two) times a day.  ? Calcium Carbonate-Vitamin D 600-400 MG-UNIT tablet Take 1 tablet by mouth 2 (two) times daily.  ? cholecalciferol (VITAMIN D) 1000 UNITS tablet Take 1,000 Units by mouth at bedtime.   ? citalopram (CELEXA) 10 MG tablet Take 1 tablet (10 mg total) by mouth daily.  ? cyanocobalamin 500 MCG tablet Take 500 mcg by mouth daily.  ? Dextran 70-Hypromellose (ARTIFICIAL TEARS) 0.1-0.3 % SOLN Apply 1-2 drops to eye daily.  ? diclofenac Sodium (VOLTAREN) 1 % GEL Apply 4 g topically 4 (four) times daily.  ? diltiazem (CARDIZEM CD) 120 MG 24 hr capsule Take 120 mg by mouth at bedtime.   ? diphenhydrAMINE (BENADRYL) 25 MG tablet Take 25-50 mg by mouth daily as needed for itching.  ? docusate sodium (COLACE) 100 MG capsule Take 200 mg by mouth at bedtime as needed for moderate constipation.   ? EPINEPHrine (EPIPEN 2-PAK) 0.3 mg/0.3 mL IJ SOAJ injection use as directed for severe allergy reaction  ? fenofibrate 160 MG tablet TAKE 1 TABLET DAILY FOR CHOLESTEROL  ? fluticasone (FLONASE) 50 MCG/ACT nasal spray Use 2 sprays in each nostril daily,  ? folic acid (FOLVITE) 053 MCG tablet Take 400 mcg by mouth daily.  ? guaiFENesin (MUCINEX) 600 MG 12 hr tablet Take 600 mg by mouth at bedtime as needed (congestion).  ? Hypromellose (ARTIFICIAL TEARS OP) Place 1-2 drops  into both eyes daily as needed (dry eyes).  ? ketotifen (ZADITOR) 0.025 % ophthalmic solution Place 1 drop into both eyes 2 (two) times daily as needed (allergies).  ? levocetirizine (XYZAL) 5 MG tablet Take 1 tablet (5 mg total) by mouth every evening.  ? levothyroxine (SYNTHROID, LEVOTHROID) 50 MCG tablet Take 50 mcg by mouth daily before breakfast. Brand Name only  ? losartan (COZAAR) 25 MG tablet Take 25 mg by mouth daily.  ? Magnesium 250 MG TABS Take 1 tablet by mouth daily.  ? Melatonin 5 MG TABS Take 1 tablet by mouth at bedtime. For sleep  ? Multiple Vitamins-Minerals (MULTIVITAMIN WITH  MINERALS) tablet Take 1 tablet by mouth daily.  ? OXYGEN Inhale into the lungs. 2 litre at night  ? pantoprazole (PROTONIX) 40 MG tablet Take 1 tablet (40 mg total) by mouth 2 (two) times daily.  ? pyridoxine (B-6) 100 MG tablet Take 100 mg by mouth daily.  ? sodium chloride (OCEAN) 0.65 % SOLN nasal spray Place 1 spray into both nostrils 2 (two) times daily.  ? sucralfate (CARAFATE) 1 g tablet Take 3 times daily with meals.  ? Vitamin A 7.5 MG (25000 UT) CAPS Take 1 tablet by mouth 2 (two) times daily.  ? zinc gluconate 50 MG tablet Take 50 mg by mouth daily.  ? ?No facility-administered encounter medications on file as of 12/11/2021.  ? ? ?Surgical History: ?Past Surgical History:  ?Procedure Laterality Date  ? BACK SURGERY  2014  ? removed bone to get to a benign tumor that was pushing on spinal column  ? BIOPSY THYROID  2018  ? bladder biopsies  2003  ? 9 biopsies done by dr. cope.  all benign.  inflammatory process going on in bladder  ? BREAST BIOPSY Right   ? benign  ? BREAST SURGERY Right   ? lumpectomy  ? CATARACT EXTRACTION W/PHACO Left 12/25/2014  ? Procedure: CATARACT EXTRACTION PHACO AND INTRAOCULAR LENS PLACEMENT (IOC);  Surgeon: Birder Robson, MD;  Location: [facility] ORS;  Service: Ophthalmology;  Laterality: Left;  [place] 00:32 ?AP% 20.0 ?CDE 6.49  ? COLONOSCOPY W/ BIOPSIES    ? COLONOSCOPY W/ POLYPECTOMY  2002, 2004, 2005  ? adenomatous polyps removed  ? COLONOSCOPY WITH PROPOFOL N/A 08/23/2017  ? Procedure: COLONOSCOPY WITH PROPOFOL;  Surgeon: Manya Silvas, MD;  Location: [facility] ENDOSCOPY;  Service: Endoscopy;  Laterality: N/A;  ? COLONOSCOPY WITH PROPOFOL N/A 11/08/2017  ? Procedure: COLONOSCOPY WITH PROPOFOL;  Surgeon: Manya Silvas, MD;  Location: [facility] ENDOSCOPY;  Service: Endoscopy;  Laterality: N/A;  ? COLONOSCOPY WITH PROPOFOL N/A 06/30/2021  ? Procedure: COLONOSCOPY WITH PROPOFOL;  Surgeon: Lesly Rubenstein, MD;  Location: [facility] ENDOSCOPY;  Service: Endoscopy;  Laterality: N/A;  ? CYSTOCELE  REPAIR N/A 04/09/2016  ? Procedure: ANTERIOR REPAIR (CYSTOCELE);  Surgeon: Gae Dry, MD;  Location: [facility] ORS;  Service: Gynecology;  Laterality: N/A;  ? DIAGNOSTIC LAPAROSCOPY  2008  ? removed both ovaries with cysts, tubes and fibroids  ? [place] OF UTERUS  1973  ? ESOPHAGOGASTRODUODENOSCOPY (EGD) WITH PROPOFOL N/A 08/23/2017  ? Procedure: ESOPHAGOGASTRODUODENOSCOPY (EGD) WITH PROPOFOL;  Surgeon: Manya Silvas, MD;  Location: [facility] ENDOSCOPY;  Service: Endoscopy;  Laterality: N/A;  ? ESOPHAGOGASTRODUODENOSCOPY (EGD) WITH PROPOFOL N/A 06/30/2021  ? Procedure: ESOPHAGOGASTRODUODENOSCOPY (EGD) WITH PROPOFOL;  Surgeon: Lesly Rubenstein, MD;  Location: [facility] ENDOSCOPY;  Service: Endoscopy;  Laterality: N/A;  ? EYE SURGERY Bilateral 2016  ? HERNIA REPAIR Right 2008  ? Inguinal hernia  Repair, ventral hernia repair  ? JOINT REPLACEMENT Right 2008  ? knee  ? KNEE ARTHROSCOPY Right   ? LUMBAR LAMINECTOMY/DECOMPRESSION MICRODISCECTOMY Right 03/21/2013  ? Procedure: Right Lumbar five-Sacral one Laminectomy for Synovial Cyst;  Surgeon: Faythe Ghee, MD;  Location: [facility] NEURO ORS;  Service: Neurosurgery;  Laterality: Right;  right  ? OOPHORECTOMY    ? REVERSE SHOULDER ARTHROPLASTY Right 02/22/2018  ? Procedure: REVERSE SHOULDER ARTHROPLASTY;  Surgeon: Corky Mull, MD;  Location: [facility] ORS;  Service: Orthopedics;  Laterality: Right;  ? TUBAL LIGATION    ? UNILATERAL SALPINGECTOMY Left 04/09/2016  ? Procedure: UNILATERAL SALPINGECTOMY;  Surgeon: Gae Dry, MD;  Location: [facility] ORS;  Service: Gynecology;  Laterality: Left;  ? UPPER GI ENDOSCOPY  2010  ? with biopsy of gastric erosion  ? VAGINAL HYSTERECTOMY N/A 04/09/2016  ? Procedure: HYSTERECTOMY VAGINAL;  Surgeon: Gae Dry, MD;  Location: [facility] ORS;  Service: Gynecology;  Laterality: N/A;  ? VAGINAL HYSTERECTOMY  2017  ? and bladder tack  ? ? ?Medical History: ?Past Medical History:  ?Diagnosis Date  ? Arthritis   ? osteo  ? Asthma   ? needs  rescue inhaler few times a year  ? Claustrophobia 04/21/2020  ? Depression   ? Dizziness   ? light headed spells but it has been a while  ? Dysrhythmia   ? tachycardia.(cardizem). brady during procedures  ? Fibro

## 2021-12-16 DIAGNOSIS — H353211 Exudative age-related macular degeneration, right eye, with active choroidal neovascularization: Secondary | ICD-10-CM | POA: Diagnosis not present

## 2022-01-08 DIAGNOSIS — I25119 Atherosclerotic heart disease of native coronary artery with unspecified angina pectoris: Secondary | ICD-10-CM | POA: Diagnosis not present

## 2022-01-08 DIAGNOSIS — R002 Palpitations: Secondary | ICD-10-CM | POA: Diagnosis not present

## 2022-01-08 DIAGNOSIS — I10 Essential (primary) hypertension: Secondary | ICD-10-CM | POA: Diagnosis not present

## 2022-01-12 ENCOUNTER — Ambulatory Visit (INDEPENDENT_AMBULATORY_CARE_PROVIDER_SITE_OTHER): Payer: Medicare Other | Admitting: Vascular Surgery

## 2022-01-12 ENCOUNTER — Encounter (INDEPENDENT_AMBULATORY_CARE_PROVIDER_SITE_OTHER): Payer: Self-pay | Admitting: Vascular Surgery

## 2022-01-12 ENCOUNTER — Ambulatory Visit (INDEPENDENT_AMBULATORY_CARE_PROVIDER_SITE_OTHER): Payer: Medicare Other

## 2022-01-12 VITALS — BP 143/71 | HR 99 | Ht 61.0 in | Wt 149.8 lb

## 2022-01-12 DIAGNOSIS — I10 Essential (primary) hypertension: Secondary | ICD-10-CM

## 2022-01-12 DIAGNOSIS — E782 Mixed hyperlipidemia: Secondary | ICD-10-CM | POA: Diagnosis not present

## 2022-01-12 DIAGNOSIS — R600 Localized edema: Secondary | ICD-10-CM | POA: Diagnosis not present

## 2022-01-12 DIAGNOSIS — J4520 Mild intermittent asthma, uncomplicated: Secondary | ICD-10-CM | POA: Diagnosis not present

## 2022-01-12 DIAGNOSIS — I890 Lymphedema, not elsewhere classified: Secondary | ICD-10-CM

## 2022-01-12 DIAGNOSIS — I25119 Atherosclerotic heart disease of native coronary artery with unspecified angina pectoris: Secondary | ICD-10-CM | POA: Diagnosis not present

## 2022-01-12 NOTE — Progress Notes (Signed)
MRN : [id number]  Vanessa Evans is a 76 y.o. ([birth year]) female who presents with chief complaint of legs swell.  History of Present Illness:   Patient is seen for evaluation of leg swelling. The patient first noticed the swelling remotely but is now concerned because of a significant increase in the overall edema. The swelling isn't associated with significant pain.  There has been an increasing amount of  discoloration noted by the patient. The patient notes that in the morning the legs are improved but they steadily worsened throughout the course of the day. Elevation seems to make the swelling of the legs better, dependency makes them much worse.   There is no history of ulcerations associated with the swelling.   The patient denies any recent changes in their medications.  The patient has not been wearing graduated compression.  The patient has no had any past angiography, interventions or vascular surgery.  The patient denies a history of DVT or PE. There is no prior history of phlebitis. There is no history of primary lymphedema.  There is no history of radiation treatment to the groin or pelvis No history of malignancies. No history of trauma or groin or pelvic surgery. No history of foreign travel or parasitic infections area    Current Meds  Medication Sig   acetaminophen (TYLENOL) 500 MG tablet Take 1,000 mg by mouth 2 (two) times daily as needed for moderate pain.   albuterol (PROVENTIL) (2.5 MG/3ML) 0.083% nebulizer solution Take 3 mLs (2.5 mg total) by nebulization every 6 (six) hours as needed for wheezing or shortness of breath.   albuterol (VENTOLIN HFA) 108 (90 Base) MCG/ACT inhaler Inhale 2 puffs into the lungs every 4 (four) hours as needed for wheezing or shortness of breath.   aspirin EC 81 MG tablet Take 81 mg by mouth at bedtime.    BLACK ELDERBERRY PO Take 1 Dose by mouth 2 (two) times a day.   Calcium Carbonate-Vitamin D 600-400 MG-UNIT tablet  Take 1 tablet by mouth 2 (two) times daily.   cholecalciferol (VITAMIN D) 1000 UNITS tablet Take 1,000 Units by mouth at bedtime.    citalopram (CELEXA) 10 MG tablet Take 1 tablet (10 mg total) by mouth daily.   cyanocobalamin 500 MCG tablet Take 500 mcg by mouth daily.   Dextran 70-Hypromellose (ARTIFICIAL TEARS) 0.1-0.3 % SOLN Apply 1-2 drops to eye daily.   diclofenac Sodium (VOLTAREN) 1 % GEL Apply 4 g topically 4 (four) times daily.   diltiazem (CARDIZEM CD) 120 MG 24 hr capsule Take 120 mg by mouth at bedtime.    diphenhydrAMINE (BENADRYL) 25 MG tablet Take 25-50 mg by mouth daily as needed for itching.   docusate sodium (COLACE) 100 MG capsule Take 200 mg by mouth at bedtime as needed for moderate constipation.    EPINEPHrine (EPIPEN 2-PAK) 0.3 mg/0.3 mL IJ SOAJ injection use as directed for severe allergy reaction   fenofibrate 160 MG tablet TAKE 1 TABLET DAILY FOR CHOLESTEROL   fluticasone (FLONASE) 50 MCG/ACT nasal spray Use 2 sprays in each nostril daily,   folic acid (FOLVITE) 409 MCG tablet Take 400 mcg by mouth daily.   guaiFENesin (MUCINEX) 600 MG 12 hr tablet Take 600 mg by mouth at bedtime as needed (congestion).   Hypromellose (ARTIFICIAL TEARS OP) Place 1-2 drops into both eyes daily as needed (dry eyes).   ketotifen (ZADITOR) 0.025 % ophthalmic solution Place 1 drop into both eyes 2 (two) times daily as  needed (allergies).   levocetirizine (XYZAL) 5 MG tablet Take 1 tablet (5 mg total) by mouth every evening.   levothyroxine (SYNTHROID, LEVOTHROID) 50 MCG tablet Take 50 mcg by mouth daily before breakfast. Brand Name only   losartan (COZAAR) 25 MG tablet Take 25 mg by mouth daily.   Magnesium 250 MG TABS Take 1 tablet by mouth daily.   Melatonin 5 MG TABS Take 1 tablet by mouth at bedtime. For sleep   Multiple Vitamins-Minerals (MULTIVITAMIN WITH MINERALS) tablet Take 1 tablet by mouth daily.   OXYGEN Inhale into the lungs. 2 litre at night   pantoprazole (PROTONIX) 40 MG  tablet Take 1 tablet (40 mg total) by mouth 2 (two) times daily.   pyridoxine (B-6) 100 MG tablet Take 100 mg by mouth daily.   sodium chloride (OCEAN) 0.65 % SOLN nasal spray Place 1 spray into both nostrils 2 (two) times daily.   sucralfate (CARAFATE) 1 g tablet Take 3 times daily with meals.   Vitamin A 7.5 MG (25000 UT) CAPS Take 1 tablet by mouth 2 (two) times daily.   zinc gluconate 50 MG tablet Take 50 mg by mouth daily.    Past Medical History:  Diagnosis Date   Arthritis    osteo   Asthma    needs rescue inhaler few times a year   Claustrophobia 04/21/2020   Depression    Dizziness    light headed spells but it has been a while   Dysrhythmia    tachycardia.(cardizem). brady during procedures   Fibromyalgia    GERD (gastroesophageal reflux disease)    gastritis   Headache(784.0)    Heart murmur 2019   aortic. dr. Nehemiah Massed not worried about this   Hyperlipidemia    Hypertension    Hypothyroidism    Macular degeneration    Migraine    Orthopnea    Pneumonia    long time ago (greater than 5 years ago)   PONV (postoperative nausea and vomiting)    very anxious during cataract surgery. 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 cope.  all benign.  inflammatory process going on in bladder   BREAST BIOPSY Right    benign   BREAST SURGERY Right    lumpectomy   CATARACT EXTRACTION W/PHACO Left 12/25/2014   Procedure: CATARACT EXTRACTION PHACO AND INTRAOCULAR LENS PLACEMENT ([place]);  Surgeon: Birder Robson, MD;  Location: [facility] ORS;  Service: Ophthalmology;  Laterality: Left;  [place] 00:32 AP% 20.0 CDE 6.49   COLONOSCOPY W/ BIOPSIES     COLONOSCOPY W/ POLYPECTOMY  2002, 2004, 2005   adenomatous polyps removed    COLONOSCOPY WITH PROPOFOL N/A 08/23/2017   Procedure: COLONOSCOPY WITH PROPOFOL;  Surgeon: Manya Silvas, MD;  Location: [facility] ENDOSCOPY;  Service: Endoscopy;  Laterality: N/A;   COLONOSCOPY WITH PROPOFOL N/A 11/08/2017   Procedure: COLONOSCOPY WITH PROPOFOL;  Surgeon: Manya Silvas, MD;  Location: [facility] ENDOSCOPY;  Service: Endoscopy;  Laterality: N/A;   COLONOSCOPY WITH PROPOFOL N/A 06/30/2021   Procedure: COLONOSCOPY WITH PROPOFOL;  Surgeon: Lesly Rubenstein, MD;  Location: [facility] ENDOSCOPY;  Service: Endoscopy;  Laterality: N/A;   CYSTOCELE REPAIR N/A 04/09/2016  Procedure: ANTERIOR REPAIR (CYSTOCELE);  Surgeon: Gae Dry, MD;  Location: [facility] ORS;  Service: Gynecology;  Laterality: N/A;   DIAGNOSTIC LAPAROSCOPY  2008   removed both ovaries with cysts, tubes and fibroids   DILATION AND CURETTAGE OF UTERUS  1973   ESOPHAGOGASTRODUODENOSCOPY (EGD) WITH PROPOFOL N/A 08/23/2017   Procedure: ESOPHAGOGASTRODUODENOSCOPY (EGD) WITH PROPOFOL;  Surgeon: Manya Silvas, MD;  Location: [facility] END[facility]COPY;  Service: Endoscopy;  Laterality: N/A;   ESOPHAGOGASTRODUODENOSCOPY (EGD) WITH PROPOFOL N/A 06/30/2021   Procedure: ESOPHAGOGASTRODUODENOSCOPY (EGD) WITH PROPOFOL;  Surgeon: Lesly Rubenstein, MD;  Location: [facility] ENDOSCOPY;  Service: Endoscopy;  Laterality: N/A;   EYE SURGERY Bilateral 2016   HERNIA REPAIR Right 2008   Inguinal hernia Repair, ventral hernia repair   JOINT REPLACEMENT Right 2008   knee   KNEE ARTHROSCOPY Right    LUMBAR LAMINECTOMY/DECOMPRESSION MICRODISCECTOMY Right 03/21/2013   Procedure: Right Lumbar five-Sacral one Laminectomy for Synovial Cyst;  Surgeon: Faythe Ghee, MD;  Location: [facility] NEURO ORS;  Service: Neurosurgery;  Laterality: Right;  right   OOPHORECTOMY     REVERSE SHOULDER ARTHROPLASTY Right 02/22/2018   Procedure: REVERSE SHOULDER ARTHROPLASTY;  Surgeon: Corky Mull, MD;  Location: [facility] ORS;  Service: Orthopedics;  Laterality: Right;   TUBAL LIGATION      UNILATERAL SALPINGECTOMY Left 04/09/2016   Procedure: UNILATERAL SALPINGECTOMY;  Surgeon: Gae Dry, MD;  Location: [facility] ORS;  Service: Gynecology;  Laterality: Left;   UPPER GI ENDOSCOPY  2010   with biopsy of gastric erosion   VAGINAL HYSTERECTOMY N/A 04/09/2016   Procedure: HYSTERECTOMY VAGINAL;  Surgeon: Gae Dry, MD;  Location: [facility] ORS;  Service: Gynecology;  Laterality: N/A;   VAGINAL HYSTERECTOMY  2017   and bladder tack    Social History Social History   Tobacco Use   Smoking status: Former    Types: Cigarettes    Quit date: 1967    Years since quitting: 56.4   Smokeless tobacco: Never  Vaping Use   Vaping Use: Never used  Substance Use Topics   Alcohol use: Yes   Drug use: No    Family History Family History  Problem Relation Age of Onset   Pulmonary fibrosis Mother    Melanoma Father    COPD Sister    Cardiomyopathy Sister        and arrhythmia   Atrial fibrillation Sister    COPD Brother    Pulmonary fibrosis Brother    Cancer Brother    Breast cancer Cousin        paternal 1st    Allergies  Allergen Reactions   Codeine Shortness Of Breath and Itching    Bronchospasms and asthma attach   Levofloxacin     Altered mental status": drunk" feeling, confused, speech problems Other reaction(s): Unknown    Oxycodone Itching and Shortness Of Breath    Would take benadryl to eliminate itching. Has had bronchospasm   Ultram [Tramadol] Itching and Other (See Comments)    Bronchospasm and asthma attack   Nitrofurantoin Diarrhea    Tried again and had no problems with it Severe and long lasting   Nsaids Other (See Comments)    Bloody stools, abdominal pain History of gastritis   Sulfacetamide Rash    Fever, abdominal pain   Adhesive [Tape] Other (See Comments)    Thin skin. Causes tears. Paper tape okay   Azithromycin     Unsure. Patient does not remember but thinks that it did not work for her.  Ciprofloxacin Other (See Comments)    Severe  pain in neck and down back    Ketorolac Itching   Sulfa Antibiotics Itching, Rash and Other (See Comments)    fever   Tolmetin     Other reaction(s): Other (See Comments) Bloody stools, abdominal pain History of chronic gastritis   Zofran [Ondansetron Hcl] Other (See Comments)    constipation     REVIEW OF SYSTEMS (Negative unless checked)  Constitutional: '[]'$ Weight loss  '[]'$ Fever  '[]'$ Chills Cardiac: '[]'$ Chest pain   '[]'$ Chest pressure   '[]'$ Palpitations   '[]'$ Shortness of breath when laying flat   '[]'$ Shortness of breath with exertion. Vascular:  '[]'$ Pain in legs with walking   '[x]'$ Pain in legs with standing  '[]'$ History of DVT   '[]'$ Phlebitis   '[x]'$ Swelling in legs   '[]'$ Varicose veins   '[]'$ Non-healing ulcers Pulmonary:   '[]'$ Uses home oxygen   '[]'$ Productive cough   '[]'$ Hemoptysis   '[]'$ Wheeze  '[]'$ COPD   '[x]'$ Asthma Neurologic:  '[]'$ Dizziness   '[]'$ Seizures   '[]'$ History of stroke   '[]'$ History of TIA  '[]'$ Aphasia   '[]'$ Vissual changes   '[]'$ Weakness or numbness in arm   '[]'$ Weakness or numbness in leg Musculoskeletal:   '[]'$ Joint swelling   '[x]'$ Joint pain   '[]'$ Low back pain Hematologic:  '[]'$ Easy bruising  '[]'$ Easy bleeding   '[]'$ Hypercoagulable state   '[]'$ Anemic Gastrointestinal:  '[]'$ Diarrhea   '[]'$ Vomiting  '[]'$ Gastroesophageal reflux/heartburn   '[]'$ Difficulty swallowing. Genitourinary:  '[]'$ Chronic kidney disease   '[]'$ Difficult urination  '[]'$ Frequent urination   '[]'$ Blood in urine Skin:  '[]'$ Rashes   '[]'$ Ulcers  Psychological:  '[]'$ History of anxiety   '[]'$  History of major depression.  Physical Examination  Vitals:   01/12/22 1326  BP: (!) 143/71  Pulse: 99  Weight: 149 lb 12.8 oz (67.9 kg)  Height: '5\' 1"'$  (1.549 m)   Body mass index is 28.3 kg/m. Gen: WD/WN, NAD Head: /AT, No temporalis wasting.  Ear/Nose/Throat: Hearing grossly intact, nares w/o erythema or drainage, pinna without lesions Eyes: PER, EOMI, sclera nonicteric.  Neck: Supple, no gross masses.  No JVD.  Pulmonary:  Good air movement, no audible wheezing, no use of accessory  muscles.  Cardiac: RRR, precordium not hyperdynamic. Vascular:  scattered varicosities present bilaterally.  Mild venous stasis changes to the legs bilaterally.  3-4+ soft pitting edema  Vessel Right Left  Radial Palpable Palpable  Gastrointestinal: soft, non-distended. No guarding/no peritoneal signs.  Musculoskeletal: M/S 5/5 throughout.  No deformity.  Neurologic: CN 2-12 intact. Pain and light touch intact in extremities.  Symmetrical.  Speech is fluent. Motor exam as listed above. Psychiatric: Judgment intact, Mood & affect appropriate for pt's clinical situation. Dermatologic: Venous rashes no ulcers noted.  No changes consistent with cellulitis. Lymph : No lichenification or skin changes of chronic lymphedema.  CBC Lab Results  Component Value Date   WBC 11.5 (H) 11/22/2021   HGB 11.0 (L) 11/22/2021   HCT 34.3 (L) 11/22/2021   MCV 88.2 11/22/2021   PLT 316 11/22/2021    BMET    Component Value Date/Time   NA 135 11/22/2021 1542   NA 140 09/30/2021 0842   K 4.0 11/22/2021 1542   CL 99 11/22/2021 1542   CO2 27 11/22/2021 1542   GLUCOSE 103 (H) 11/22/2021 1542   BUN 15 11/22/2021 1542   BUN 17 09/30/2021 0842   CREATININE 0.67 11/22/2021 1542   CALCIUM 9.8 11/22/2021 1542   GFRNONAA >60 11/22/2021 1542   GFRAA 96 08/23/2020 0907   CrCl cannot be calculated (Patient's most recent lab  result is older than the maximum 21 days allowed.).  COAG Lab Results  Component Value Date   INR 0.92 02/11/2018   INR 1.01 04/03/2016    Radiology VAS [place] LOWER EXTREMITY VENOUS REFLUX  Result Date: 01/12/2022  Lower Venous Reflux Study Patient Name:  Vanessa Evans  Date of Exam:   01/12/2022 Medical Rec #: [id number]          Accession #:    [id number] Date of Birth: July 26, 1946          Patient Gender: F Patient Age:   65 years Exam Location:  [facility] Procedure:      VAS [place] LOWER EXTREMITY VENOUS REFLUX Referring Phys: Eulogio Ditch  --------------------------------------------------------------------------------  Other Indications: Hx of swelling, Improved at this time. Performing Technologist: Concha [place] RVT  Examination Guidelines: A complete evaluation includes B-mode imaging, spectral Doppler, color Doppler, and power Doppler as needed of all accessible portions of each vessel. Bilateral testing is considered an integral part of a complete examination. Limited examinations for reoccurring indications may be performed as noted. The reflux portion of the exam is performed with the patient in reverse Trendelenburg. Significant venous reflux is defined as >500 ms in the superficial venous system, and >1 second in the deep venous system.   Summary: Bilateral: - No evidence of deep vein thrombosis seen in the lower extremities, bilaterally, from the common femoral through the popliteal veins. - No evidence of superficial venous thrombosis in the lower extremities, bilaterally. - No evidence of deep venous insufficiency seen bilaterally in the lower extremity. - No evidence of superficial venous reflux seen in the greater saphenous veins bilaterally. - No evidence of superficial venous reflux seen in the short saphenous veins bilaterally.  *See table(s) above for measurements and observations. Electronically signed by Hortencia Pilar MD on 01/12/2022 at 5:56:19 PM.    Final      Assessment/Plan 1. Lymphedema Recommend:  No surgery or intervention at this point in time.  I have reviewed my discussion with the patient regarding venous insufficiency and why it causes symptoms. I have discussed with the patient the chronic skin changes that accompany venous insufficiency and the long term sequela such as ulceration. Patient will contnue wearing graduated compression stockings on a daily basis, as this has provided excellent control of his edema. The patient will put the stockings on first thing in the morning and removing them in the evening.  The patient is reminded not to sleep in the stockings.  In addition, behavioral modification including elevation during the day will be initiated. Exercise is strongly encouraged.  Previous duplex ultrasound of the lower extremities shows normal deep system, no significant superficial reflux was identified.  Given the patient's good control and lack of any problems regarding the venous insufficiency and lymphedema a lymph pump in not need at this time.    The patient will follow up with me PRN should anything change.  The patient voices agreement with this plan.   2. Coronary artery disease involving native coronary artery of native heart with angina pectoris ([place]) Continue cardiac and antihypertensive medications as already ordered and reviewed, no changes at this time.  Continue statin as ordered and reviewed, no changes at this time  Nitrates PRN for chest pain   3. Benign essential HTN Continue antihypertensive medications as already ordered, these medications have been reviewed and there are no changes at this time.   4. Mild intermittent asthma without complication Continue pulmonary medications and aerosols  as already ordered, these medications have been reviewed and there are no changes at this time.    5. Mixed hyperlipidemia Continue statin as ordered and reviewed, no changes at this time     Hortencia Pilar, MD  01/18/2022 12:20 PM

## 2022-01-18 ENCOUNTER — Encounter (INDEPENDENT_AMBULATORY_CARE_PROVIDER_SITE_OTHER): Payer: Self-pay | Admitting: Vascular Surgery

## 2022-01-18 DIAGNOSIS — I890 Lymphedema, not elsewhere classified: Secondary | ICD-10-CM | POA: Insufficient documentation

## 2022-02-05 ENCOUNTER — Other Ambulatory Visit: Payer: Self-pay | Admitting: Physician Assistant

## 2022-02-05 DIAGNOSIS — E782 Mixed hyperlipidemia: Secondary | ICD-10-CM

## 2022-02-09 DIAGNOSIS — M5412 Radiculopathy, cervical region: Secondary | ICD-10-CM | POA: Diagnosis not present

## 2022-02-09 DIAGNOSIS — M47812 Spondylosis without myelopathy or radiculopathy, cervical region: Secondary | ICD-10-CM | POA: Diagnosis not present

## 2022-02-09 DIAGNOSIS — M7582 Other shoulder lesions, left shoulder: Secondary | ICD-10-CM | POA: Diagnosis not present

## 2022-02-24 ENCOUNTER — Other Ambulatory Visit: Payer: Self-pay | Admitting: Internal Medicine

## 2022-02-24 ENCOUNTER — Ambulatory Visit
Admission: RE | Admit: 2022-02-24 | Discharge: 2022-02-24 | Disposition: A | Discharge status: Home / Self Care | Payer: Medicare Other | Source: Ambulatory Visit | Attending: Physician Assistant | Admitting: Physician Assistant

## 2022-02-24 DIAGNOSIS — Z1231 Encounter for screening mammogram for malignant neoplasm of breast: Secondary | ICD-10-CM | POA: Insufficient documentation

## 2022-02-24 DIAGNOSIS — K219 Gastro-esophageal reflux disease without esophagitis: Secondary | ICD-10-CM

## 2022-03-17 DIAGNOSIS — H353211 Exudative age-related macular degeneration, right eye, with active choroidal neovascularization: Secondary | ICD-10-CM | POA: Diagnosis not present

## 2022-03-20 ENCOUNTER — Other Ambulatory Visit: Payer: Self-pay | Admitting: Physician Assistant

## 2022-03-20 DIAGNOSIS — F331 Major depressive disorder, recurrent, moderate: Secondary | ICD-10-CM

## 2022-03-30 ENCOUNTER — Ambulatory Visit: Payer: Medicare Other | Admitting: Internal Medicine

## 2022-04-01 ENCOUNTER — Ambulatory Visit: Payer: Medicare Other | Admitting: Podiatry

## 2022-04-02 ENCOUNTER — Telehealth (INDEPENDENT_AMBULATORY_CARE_PROVIDER_SITE_OTHER): Payer: Medicare Other | Admitting: Physician Assistant

## 2022-04-02 VITALS — BP 136/72 | HR 110 | Temp 100.8°F | Ht 60.0 in | Wt 145.0 lb

## 2022-04-02 DIAGNOSIS — U071 COVID-19: Secondary | ICD-10-CM | POA: Diagnosis not present

## 2022-04-02 DIAGNOSIS — J4520 Mild intermittent asthma, uncomplicated: Secondary | ICD-10-CM | POA: Diagnosis not present

## 2022-04-02 MED ORDER — AZITHROMYCIN 250 MG PO TABS: ORAL_TABLET | ORAL | 0 refills | Status: DC

## 2022-04-02 MED ORDER — PREDNISONE 10 MG PO TABS: ORAL_TABLET | ORAL | 0 refills | Status: DC

## 2022-04-02 NOTE — Progress Notes (Signed)
[facility] [place], [place] [address]  Internal MEDICINE  Telephone Visit  Patient Name: Vanessa Evans  [id number]  [id number]  Date of Service: 04/09/2022  I connected with the patient at 11:20 by telephone and verified the patients identity using two identifiers.   I discussed the limitations, risks, security and privacy concerns of performing an evaluation and management service by telephone and the availability of in person appointments. I also discussed with the patient that there may be a patient responsible charge related to the service.  The patient expressed understanding and agrees to proceed.    Chief Complaint  Patient presents with   Telephone Assessment   Telephone Screen    Covid positive    Sinusitis   Cough   Fever    HPI Pt is here for virtual sick visit due to being covid positive -Symptoms started Tues night. Started with wheezing and chest tightness and tried mucinex and tessalon. Then yesterday started feeling worse with headaches and SOB. Fever 100.8 this morning and is taking tylenol -Using neb twice and her inhalers -Wears oyxgen at night and has this just in case she needs oxygen now, O2 up to 93% at time of conversation. Will monitor cloesly and knows to call rescue if worsening SOB that is not improving with rest/oxygen added  Current Medication: Outpatient Encounter Medications as of 04/02/2022  Medication Sig   acetaminophen (TYLENOL) 500 MG tablet Take 1,000 mg by mouth 2 (two) times daily as needed for moderate pain.   albuterol (PROVENTIL) (2.5 MG/3ML) 0.083% nebulizer solution Take 3 mLs (2.5 mg total) by nebulization every 6 (six) hours as needed for wheezing or shortness of breath.   albuterol (VENTOLIN HFA) 108 (90 Base) MCG/ACT inhaler Inhale 2 puffs into the lungs every 4 (four) hours as needed for wheezing or shortness of breath.   aspirin EC 81 MG tablet Take 81 mg by mouth at bedtime.    azithromycin  (ZITHROMAX) 250 MG tablet Take one tab a day for 10 days for uri   BLACK ELDERBERRY PO Take 1 Dose by mouth 2 (two) times a day.   Calcium Carbonate-Vitamin D 600-400 MG-UNIT tablet Take 1 tablet by mouth 2 (two) times daily.   chlorpheniramine-HYDROcodone (TUSSIONEX) 10-8 MG/5ML Take 5 mLs by mouth every 12 (twelve) hours as needed for cough.   cholecalciferol (VITAMIN D) 1000 UNITS tablet Take 1,000 Units by mouth at bedtime.    citalopram (CELEXA) 10 MG tablet Take 1 tablet (10 mg total) by mouth daily.   cyanocobalamin 500 MCG tablet Take 500 mcg by mouth daily.   Dextran 70-Hypromellose (ARTIFICIAL TEARS) 0.1-0.3 % SOLN Apply 1-2 drops to eye daily.   diclofenac Sodium (VOLTAREN) 1 % GEL Apply 4 g topically 4 (four) times daily.   diltiazem (CARDIZEM CD) 120 MG 24 hr capsule Take 120 mg by mouth at bedtime.    diphenhydrAMINE (BENADRYL) 25 MG tablet Take 25-50 mg by mouth daily as needed for itching.   docusate sodium (COLACE) 100 MG capsule Take 200 mg by mouth at bedtime as needed for moderate constipation.    EPINEPHrine (EPIPEN 2-PAK) 0.3 mg/0.3 mL IJ SOAJ injection use as directed for severe allergy reaction   fenofibrate 160 MG tablet TAKE 1 TABLET DAILY FOR CHOLESTEROL   fluticasone (FLONASE) 50 MCG/ACT nasal spray Use 2 sprays in each nostril daily,   folic acid (FOLVITE) 235 MCG tablet Take 400 mcg by mouth daily.   guaiFENesin (MUCINEX) 600 MG 12  hr tablet Take 600 mg by mouth at bedtime as needed (congestion).   Hypromellose (ARTIFICIAL TEARS OP) Place 1-2 drops into both eyes daily as needed (dry eyes).   ketotifen (ZADITOR) 0.025 % ophthalmic solution Place 1 drop into both eyes 2 (two) times daily as needed (allergies).   levocetirizine (XYZAL) 5 MG tablet Take 1 tablet (5 mg total) by mouth every evening.   levothyroxine (SYNTHROID, LEVOTHROID) 50 MCG tablet Take 50 mcg by mouth daily before breakfast. Brand Name only   losartan (COZAAR) 25 MG tablet Take 25 mg by mouth  daily.   Magnesium 250 MG TABS Take 1 tablet by mouth daily.   Melatonin 5 MG TABS Take 1 tablet by mouth at bedtime. For sleep   Multiple Vitamins-Minerals (MULTIVITAMIN WITH MINERALS) tablet Take 1 tablet by mouth daily.   OXYGEN Inhale into the lungs. 2 litre at night   pantoprazole (PROTONIX) 40 MG tablet Take 1 tablet (40 mg total) by mouth 2 (two) times daily.   pyridoxine (B-6) 100 MG tablet Take 100 mg by mouth daily.   sodium chloride (OCEAN) 0.65 % SOLN nasal spray Place 1 spray into both nostrils 2 (two) times daily.   sucralfate (CARAFATE) 1 g tablet Take 3 times daily with meals.   Vitamin A 7.5 MG (25000 UT) CAPS Take 1 tablet by mouth 2 (two) times daily.   zinc gluconate 50 MG tablet Take 50 mg by mouth daily.   [DISCONTINUED] predniSONE (DELTASONE) 10 MG tablet Use as directed for 12 days taper   predniSONE (DELTASONE) 10 MG tablet Use as directed for 12 days taper   No facility-administered encounter medications on file as of 04/02/2022.    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 cope.  all benign.  inflammatory process going on in bladder   BREAST BIOPSY Right    benign   BREAST SURGERY Right    lumpectomy   CATARACT EXTRACTION W/PHACO Left 12/25/2014   Procedure: CATARACT EXTRACTION PHACO AND INTRAOCULAR LENS PLACEMENT ([place]);  Surgeon: Birder Robson, MD;  Location: [facility] ORS;  Service: Ophthalmology;  Laterality: Left;  [place] 00:32 AP% 20.0 CDE 6.49   COLONOSCOPY W/ BIOPSIES     COLONOSCOPY W/ POLYPECTOMY  2002, 2004, 2005   adenomatous polyps removed   COLONOSCOPY WITH PROPOFOL N/A 08/23/2017   Procedure: COLONOSCOPY WITH PROPOFOL;  Surgeon: Manya Silvas, MD;  Location: [facility] ENDOSCOPY;  Service: Endoscopy;  Laterality: N/A;   COLONOSCOPY WITH PROPOFOL N/A 11/08/2017    Procedure: COLONOSCOPY WITH PROPOFOL;  Surgeon: Manya Silvas, MD;  Location: [facility];  Service: Endoscopy;  Laterality: N/A;   COLONOSCOPY WITH PROPOFOL N/A 06/30/2021   Procedure: COLONOSCOPY WITH PROPOFOL;  Surgeon: Lesly Rubenstein, MD;  Location: [facility] ENDOSCOPY;  Service: Endoscopy;  Laterality: N/A;   CYSTOCELE REPAIR N/A 04/09/2016   Procedure: ANTERIOR REPAIR (CYSTOCELE);  Surgeon: Gae Dry, MD;  Location: [facility] ORS;  Service: Gynecology;  Laterality: N/A;   DIAGNOSTIC LAPAROSCOPY  2008   removed both ovaries with cysts, tubes and fibroids   DILATION AND CURETTAGE OF UTERUS  1973   ESOPHAGOGASTRODUODENOSCOPY (EGD) WITH PROPOFOL N/A 08/23/2017   Procedure: ESOPHAGOGASTRODUODENOSCOPY (EGD) WITH PROPOFOL;  Surgeon: Manya Silvas, MD;  Location: [facility] ENDOSCOPY;  Service: Endoscopy;  Laterality: N/A;   ESOPHAGOGASTRODUODENOSCOPY (EGD) WITH PROPOFOL N/A 06/30/2021   Procedure: ESOPHAGOGASTRODUODENOSCOPY (EGD) WITH PROPOFOL;  Surgeon: Lesly Rubenstein, MD;  Location: [facility] ENDOSCOPY;  Service: Endoscopy;  Laterality: N/A;   EYE SURGERY Bilateral 2016   HERNIA REPAIR Right 2008   Inguinal hernia Repair, ventral hernia repair   JOINT REPLACEMENT Right 2008   knee   KNEE ARTHROSCOPY Right    LUMBAR LAMINECTOMY/DECOMPRESSION MICRODISCECTOMY Right 03/21/2013   Procedure: Right Lumbar five-Sacral one Laminectomy for Synovial Cyst;  Surgeon: Faythe Ghee, MD;  Location: [facility] NEURO ORS;  Service: Neurosurgery;  Laterality: Right;  right   OOPHORECTOMY     REVERSE SHOULDER ARTHROPLASTY Right 02/22/2018   Procedure: REVERSE SHOULDER ARTHROPLASTY;  Surgeon: Corky Mull, MD;  Location: [facility] ORS;  Service: Orthopedics;  Laterality: Right;   TUBAL LIGATION     UNILATERAL SALPINGECTOMY Left 04/09/2016   Procedure: UNILATERAL SALPINGECTOMY;  Surgeon: Gae Dry, MD;  Location: [facility] ORS;  Service: Gynecology;  Laterality: Left;   UPPER GI ENDOSCOPY  2010   with biopsy of  gastric erosion   VAGINAL HYSTERECTOMY N/A 04/09/2016   Procedure: HYSTERECTOMY VAGINAL;  Surgeon: Gae Dry, MD;  Location: [facility] ORS;  Service: Gynecology;  Laterality: N/A;   VAGINAL HYSTERECTOMY  2017   and bladder tack    Medical History: Past Medical History:  Diagnosis Date   Arthritis    osteo   Asthma    needs rescue inhaler few times a year   Claustrophobia 04/21/2020   Depression    Dizziness    light headed spells but it has been a while   Dysrhythmia    tachycardia.(cardizem). brady during procedures   Fibromyalgia    GERD (gastroesophageal reflux disease)    gastritis   Headache(784.0)    Heart murmur 2019   aortic. dr. Nehemiah Massed not worried about this   Hyperlipidemia    Hypertension    Hypothyroidism    Macular degeneration    Migraine    Orthopnea    Pneumonia    long time ago (greater than 5 years ago)   PONV (postoperative nausea and vomiting)    very anxious during cataract surgery. brady during colonoscopy   Shortness of breath    any exertion, cannot lie flat    Family History: Family History  Problem Relation Age of Onset   Pulmonary fibrosis Mother    Melanoma Father    COPD Sister    Cardiomyopathy Sister        and arrhythmia   Atrial fibrillation Sister    COPD Brother    Pulmonary fibrosis Brother    Cancer Brother    Breast cancer Cousin        paternal 1st    Social History   Socioeconomic History   Marital status: Married    Spouse name: Not on file   Number of children: Not on file   Years of education: Not on file   Highest education level: Not on file  Occupational History   Not on file  Tobacco Use   Smoking status: Former    Types: Cigarettes    Quit date: 1967    Years since quitting: 56.7   Smokeless tobacco: Never  Vaping Use   Vaping Use: Never used  Substance and Sexual Activity   Alcohol use: Yes   Drug use: No   Sexual activity: Not Currently  Other Topics Concern   Not on file  Social History  Narrative  Not on file   Social Determinants of Health   Financial Resource Strain: Not on file  Food Insecurity: Not on file  Transportation Needs: Not on file  Physical Activity: Not on file  Stress: Not on file  Social Connections: Not on file  Intimate Partner Violence: Not on file      Review of Systems  Constitutional:  Positive for fatigue and fever.  HENT:  Positive for congestion and postnasal drip. Negative for mouth sores.   Respiratory:  Positive for cough, chest tightness, shortness of breath and wheezing.   Cardiovascular:  Negative for chest pain.  Genitourinary:  Negative for flank pain.  Neurological:  Positive for headaches.  Psychiatric/Behavioral: Negative.      Vital Signs: BP 136/72   Pulse (!) 110   Temp (!) 100.8 F (38.2 C)   Ht 5' (1.524 m)   Wt 145 lb (65.8 kg)   SpO2 90%   BMI 28.32 kg/m    Observation/Objective:  Pt is able to carry out conversation   Assessment/Plan: 1. Acute COVID-19 Will start on zpak and prednisone and may use tussionex as needed for acute coughing, but cautioned it can make her drowsy. Advised to continue mucinex and inhalers/duoneb and rest and stay well hydrated. Will go to ED if acute worsening  - azithromycin (ZITHROMAX) 250 MG tablet; Take one tab a day for 10 days for uri  Dispense: 10 tablet; Refill: 0 - predniSONE (DELTASONE) 10 MG tablet; Use as directed for 12 days taper  Dispense: 48 tablet; Refill: 0 - chlorpheniramine-HYDROcodone (TUSSIONEX) 10-8 MG/5ML; Take 5 mLs by mouth every 12 (twelve) hours as needed for cough.  Dispense: 70 mL; Refill: 0  2. Chronic asthma, mild intermittent, uncomplicated Continue inhalers/duoneb as needed    General Counseling: Sheyenne verbalizes understanding of the findings of today's phone visit and agrees with plan of treatment. I have discussed any further diagnostic evaluation that may be needed or ordered today. We also reviewed her medications today. she has been  encouraged to call the office with any questions or concerns that should arise related to todays visit.    No orders of the defined types were placed in this encounter.   Meds ordered this encounter  Medications   azithromycin (ZITHROMAX) 250 MG tablet    Sig: Take one tab a day for 10 days for uri    Dispense:  10 tablet    Refill:  0   predniSONE (DELTASONE) 10 MG tablet    Sig: Use as directed for 12 days taper    Dispense:  48 tablet    Refill:  0   chlorpheniramine-HYDROcodone (TUSSIONEX) 10-8 MG/5ML    Sig: Take 5 mLs by mouth every 12 (twelve) hours as needed for cough.    Dispense:  70 mL    Refill:  0    Time spent:25 Minutes    Dr Lavera Guise Internal medicine

## 2022-04-03 ENCOUNTER — Telehealth: Payer: Self-pay

## 2022-04-03 MED ORDER — HYDROCOD POLI-CHLORPHE POLI ER 10-8 MG/5ML PO SUER: 5.0000 mL | Freq: Two times a day (BID) | ORAL | 0 refills | Status: DC | PRN

## 2022-04-03 NOTE — Telephone Encounter (Signed)
Pt called albuterol make her shaky  her O2 94 today as per lauren gave pt samples for Duoneb and send tussionex for cough advised her make her drowsy and also advised her if symptoms getting worse go to ED

## 2022-04-06 ENCOUNTER — Ambulatory Visit: Payer: Medicare Other | Admitting: Physician Assistant

## 2022-04-07 ENCOUNTER — Ambulatory Visit: Payer: Medicare Other | Admitting: Podiatry

## 2022-04-09 ENCOUNTER — Telehealth: Payer: Self-pay

## 2022-04-09 ENCOUNTER — Other Ambulatory Visit: Payer: Self-pay

## 2022-04-09 MED ORDER — FLUCONAZOLE 150 MG PO TABS: ORAL_TABLET | ORAL | 0 refills | Status: DC

## 2022-04-09 NOTE — Telephone Encounter (Signed)
Pt called that she is having thrush as per lauren send diflucan

## 2022-04-28 ENCOUNTER — Encounter: Payer: Self-pay | Admitting: Emergency Medicine

## 2022-04-28 ENCOUNTER — Emergency Department
Admission: EM | Admit: 2022-04-28 | Discharge: 2022-04-28 | Disposition: A | Discharge status: Home / Self Care | Payer: Medicare Other | Attending: Emergency Medicine | Admitting: Emergency Medicine

## 2022-04-28 ENCOUNTER — Emergency Department: Payer: Medicare Other

## 2022-04-28 ENCOUNTER — Other Ambulatory Visit: Payer: Self-pay

## 2022-04-28 DIAGNOSIS — M48061 Spinal stenosis, lumbar region without neurogenic claudication: Secondary | ICD-10-CM | POA: Diagnosis not present

## 2022-04-28 DIAGNOSIS — M810 Age-related osteoporosis without current pathological fracture: Secondary | ICD-10-CM | POA: Diagnosis not present

## 2022-04-28 DIAGNOSIS — G8929 Other chronic pain: Secondary | ICD-10-CM | POA: Insufficient documentation

## 2022-04-28 DIAGNOSIS — X58XXXA Exposure to other specified factors, initial encounter: Secondary | ICD-10-CM | POA: Diagnosis not present

## 2022-04-28 DIAGNOSIS — S22088A Other fracture of T11-T12 vertebra, initial encounter for closed fracture: Secondary | ICD-10-CM

## 2022-04-28 DIAGNOSIS — S22080A Wedge compression fracture of T11-T12 vertebra, initial encounter for closed fracture: Secondary | ICD-10-CM | POA: Diagnosis not present

## 2022-04-28 DIAGNOSIS — S299XXA Unspecified injury of thorax, initial encounter: Secondary | ICD-10-CM | POA: Diagnosis present

## 2022-04-28 DIAGNOSIS — M4126 Other idiopathic scoliosis, lumbar region: Secondary | ICD-10-CM | POA: Diagnosis not present

## 2022-04-28 DIAGNOSIS — M545 Low back pain, unspecified: Secondary | ICD-10-CM

## 2022-04-28 MED ORDER — BACLOFEN 10 MG PO TABS: 10.0000 mg | ORAL_TABLET | Freq: Three times a day (TID) | ORAL | 0 refills | Status: AC

## 2022-04-28 MED ORDER — HYDROCODONE-ACETAMINOPHEN 5-325 MG PO TABS: 1.0000 | ORAL_TABLET | Freq: Four times a day (QID) | ORAL | 0 refills | Status: DC | PRN

## 2022-04-28 MED ORDER — METHYLPREDNISOLONE 4 MG PO TBPK: ORAL_TABLET | ORAL | 0 refills | Status: DC

## 2022-04-28 MED ORDER — HYDROCODONE-ACETAMINOPHEN 5-325 MG PO TABS
1.0000 | ORAL_TABLET | Freq: Once | ORAL | Status: AC
  Administered 2022-04-28: 1 via ORAL
  Filled 2022-04-28: qty 1

## 2022-04-28 NOTE — Discharge Instructions (Addendum)
Use the baclofen instead of your methocarbamol.  Use the steroid pack as prescribed.  Hydrocodone for pain not controlled by these medications.  Apply ice to the lower back.  Follow-up with your regular doctor.  Follow-up with Dr. Cari Caraway by calling to make an appointment.  Return to the emergency department if you are worsening

## 2022-04-28 NOTE — ED Provider Notes (Signed)
[facility] Provider Note    Event Date/Time   First MD Initiated Contact with Patient 04/28/22 1401     (approximate)   History   Back Pain   HPI  Vanessa Evans is a 76 y.o. female with history of chronic back problems presents emergency department complaining of worsening low back pain.  Patient states it is sharp in nature feels like bone-on-bone.  Worse with movement.  Does not radiate into the abdomen.  Patient sees Dr Ashley Mariner and has seen Dr. Cari Caraway.  Pain started within the last week      Physical Exam   Triage Vital Signs: ED Triage Vitals [04/28/22 1355]  Enc Vitals Group     BP (!) 153/89     Pulse Rate (!) 115     Resp 18     Temp 97.7 F (36.5 C)     Temp Source Oral     SpO2 95 %     Weight 148 lb (67.1 kg)     Height 5' (1.524 m)     Head Circumference      Peak Flow      Pain Score 10     Pain Loc      Pain Edu?      Excl. in [place]?     Most recent vital signs: Vitals:   04/28/22 1355  BP: (!) 153/89  Pulse: (!) 115  Resp: 18  Temp: 97.7 F (36.5 C)  SpO2: 95%     General: Awake, no distress.   CV:  Good peripheral perfusion. regular rate and  rhythm Resp:  Normal effort.  Abd:  No distention.  Nontender Other:  Lumbar spine is tender to palpation, patient is able to ambulate, 5/5 strength in lower extremities, neurovascular intact   ED Results / Procedures / Treatments   Labs (all labs ordered are listed, but only abnormal results are displayed) Labs Reviewed - No data to display   EKG     RADIOLOGY CT lumbar spine    PROCEDURES:   Procedures   MEDICATIONS ORDERED IN ED: Medications  HYDROcodone-acetaminophen (NORCO/VICODIN) 5-325 MG per tablet 1 tablet (1 tablet Oral Given 04/28/22 1413)     IMPRESSION / MDM / ASSESSMENT AND PLAN / ED COURSE  I reviewed the triage vital signs and the nursing notes.                              Differential diagnosis includes, but is not  limited to, fracture, contusion, sprain, neuropathy  Patient's presentation is most consistent with acute complicated illness / injury requiring diagnostic workup.   Patient's symptoms are strictly musculoskeletal.  CT of the lumbar spine independently reviewed and interpreted by me as having a T12 endplate fracture.  Chronic changes otherwise.  Gave the patient hydrocodone while here in the ED.  She had some relief with this medication states pain is more of a burning pain now.  She is able to ambulate to the bathroom without help.  Did discuss findings of the CT.  She has an appointment at [facility] on Tuesday of next week.  Encouraged her to also follow-up Dr. Cari Caraway.  She was given a prescription for a Medrol Dosepak, hydrocodone, and we will switch her muscle relaxer to baclofen.  She is in agreement with this treatment plan.  She was discharged in stable condition.      FINAL CLINICAL IMPRESSION(S) / ED  DIAGNOSES   Final diagnoses:  Acute exacerbation of chronic low back pain  Other closed fracture of twelfth thoracic vertebra, initial encounter ([place])     Rx / DC Orders   ED Discharge Orders          Ordered    methylPREDNISolone (MEDROL DOSEPAK) 4 MG TBPK tablet        04/28/22 1550    baclofen (LIORESAL) 10 MG tablet  3 times daily        04/28/22 1550    HYDROcodone-acetaminophen (NORCO/VICODIN) 5-325 MG tablet  Every 6 hours PRN        04/28/22 1550             Note:  This document was prepared using Dragon voice recognition software and may include unintentional dictation errors.    Versie Starks, PA-C 04/28/22 1556    Duffy Bruce, MD 04/29/22 [phone]

## 2022-04-28 NOTE — ED Notes (Signed)
See triage note  Presents with lower back pain  States pain is chronic  but it has gotten worse  Denies any new injury

## 2022-04-28 NOTE — ED Triage Notes (Signed)
Pt here with back pain. Pt has chronic back pain but states it is much worse than usual. Pt uses a walker for ambulation.

## 2022-04-30 ENCOUNTER — Ambulatory Visit: Payer: Medicare Other | Admitting: Internal Medicine

## 2022-05-06 ENCOUNTER — Ambulatory Visit
Admission: RE | Admit: 2022-05-06 | Discharge: 2022-05-06 | Disposition: A | Discharge status: Home / Self Care | Payer: Medicare Other | Source: Ambulatory Visit | Attending: Family Medicine | Admitting: Family Medicine

## 2022-05-06 ENCOUNTER — Other Ambulatory Visit (HOSPITAL_COMMUNITY): Payer: Self-pay | Admitting: Family Medicine

## 2022-05-06 ENCOUNTER — Other Ambulatory Visit: Payer: Self-pay | Admitting: Family Medicine

## 2022-05-06 DIAGNOSIS — M5414 Radiculopathy, thoracic region: Secondary | ICD-10-CM

## 2022-05-06 DIAGNOSIS — M47814 Spondylosis without myelopathy or radiculopathy, thoracic region: Secondary | ICD-10-CM | POA: Diagnosis not present

## 2022-05-06 DIAGNOSIS — M5136 Other intervertebral disc degeneration, lumbar region: Secondary | ICD-10-CM | POA: Diagnosis not present

## 2022-05-06 DIAGNOSIS — S22000A Wedge compression fracture of unspecified thoracic vertebra, initial encounter for closed fracture: Secondary | ICD-10-CM | POA: Insufficient documentation

## 2022-05-06 DIAGNOSIS — S22080A Wedge compression fracture of T11-T12 vertebra, initial encounter for closed fracture: Secondary | ICD-10-CM | POA: Diagnosis not present

## 2022-05-06 DIAGNOSIS — M48062 Spinal stenosis, lumbar region with neurogenic claudication: Secondary | ICD-10-CM | POA: Diagnosis not present

## 2022-05-06 DIAGNOSIS — M546 Pain in thoracic spine: Secondary | ICD-10-CM | POA: Diagnosis not present

## 2022-05-06 DIAGNOSIS — M40204 Unspecified kyphosis, thoracic region: Secondary | ICD-10-CM | POA: Diagnosis not present

## 2022-05-06 DIAGNOSIS — Z23 Encounter for immunization: Secondary | ICD-10-CM | POA: Diagnosis not present

## 2022-05-06 DIAGNOSIS — M419 Scoliosis, unspecified: Secondary | ICD-10-CM | POA: Diagnosis not present

## 2022-05-07 ENCOUNTER — Other Ambulatory Visit: Payer: Self-pay | Admitting: Interventional Radiology

## 2022-05-07 ENCOUNTER — Ambulatory Visit
Admission: RE | Admit: 2022-05-07 | Discharge: 2022-05-07 | Disposition: A | Discharge status: Home / Self Care | Payer: Medicare Other | Source: Ambulatory Visit | Attending: Family Medicine | Admitting: Family Medicine

## 2022-05-07 ENCOUNTER — Other Ambulatory Visit: Payer: Self-pay | Admitting: Family Medicine

## 2022-05-07 DIAGNOSIS — S22080A Wedge compression fracture of T11-T12 vertebra, initial encounter for closed fracture: Secondary | ICD-10-CM

## 2022-05-07 NOTE — Consult Note (Signed)
Chief Complaint: Patient was seen in consultation today for No chief complaint on file.  at the request of Evans,Vanessa L  Referring Physician(s): Evans,Vanessa L  History of Present Illness: Vanessa Evans is a 76 y.o. female with history of recently T12 vertebral compression fracture. On 04/28/22 she woke up with severe back pain and spasms.  She presented to the ED the following day and has been on intermittent opioid pain control since then, which is marginally covering the pain.  MRI thoracic spine was performed yesterday as a result of an x-ray performed at [facility] demonstrating T12 fracture.  She denies any specific injury.  She has a family history of osteoporosis and vertebral body compression fractures.    Past Medical History:  Diagnosis Date   Arthritis    osteo   Asthma    needs rescue inhaler few times a year   Claustrophobia 04/21/2020   Depression    Dizziness    light headed spells but it has been a while   Dysrhythmia    tachycardia.(cardizem). brady during procedures   Fibromyalgia    GERD (gastroesophageal reflux disease)    gastritis   Headache(784.0)    Heart murmur 2019   aortic. dr. Nehemiah Massed not worried about this   Hyperlipidemia    Hypertension    Hypothyroidism    Macular degeneration    Migraine    Orthopnea    Pneumonia    long time ago (greater than 5 years ago)   PONV (postoperative nausea and vomiting)    very anxious during cataract surgery. 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 cope.  all benign.  inflammatory process going on in bladder   BREAST BIOPSY Right    benign   BREAST SURGERY Right    lumpectomy   CATARACT EXTRACTION W/PHACO Left 12/25/2014    Procedure: CATARACT EXTRACTION PHACO AND INTRAOCULAR LENS PLACEMENT ([place]);  Surgeon: Birder Robson, MD;  Location: [facility] ORS;  Service: Ophthalmology;  Laterality: Left;  [place] 00:32 AP% 20.0 CDE 6.49   COLONOSCOPY W/ BIOPSIES     COLONOSCOPY W/ POLYPECTOMY  2002, 2004, 2005   adenomatous polyps removed   COLONOSCOPY WITH PROPOFOL N/A 08/23/2017   Procedure: COLONOSCOPY WITH PROPOFOL;  Surgeon: Manya Silvas, MD;  Location: [facility] ENDOSCOPY;  Service: Endoscopy;  Laterality: N/A;   COLONOSCOPY WITH PROPOFOL N/A 11/08/2017   Procedure: COLONOSCOPY WITH PROPOFOL;  Surgeon: Manya Silvas, MD;  Location: [facility] ENDOSCOPY;  Service: Endoscopy;  Laterality: N/A;   COLONOSCOPY WITH PROPOFOL N/A 06/30/2021   Procedure: COLONOSCOPY WITH PROPOFOL;  Surgeon: Lesly Rubenstein, MD;  Location: [facility] ENDOSCOPY;  Service: Endoscopy;  Laterality: N/A;   CYSTOCELE REPAIR N/A 04/09/2016   Procedure: ANTERIOR REPAIR (CYSTOCELE);  Surgeon: Gae Dry, MD;  Location: [facility] ORS;  Service: Gynecology;  Laterality: N/A;   DIAGNOSTIC LAPAROSCOPY  2008   removed both ovaries with cysts, tubes and fibroids   DILATION AND CURETTAGE OF UTERUS  1973   ESOPHAGOGASTRODUODENOSCOPY (EGD) WITH PROPOFOL N/A 08/23/2017   Procedure: ESOPHAGOGASTRODUODENOSCOPY (EGD) WITH PROPOFOL;  Surgeon: Manya Silvas, MD;  Location[facility] END[facility]COPY;  Service: Endoscopy;  Laterality: N/A;  ESOPHAGOGASTRODUODENOSCOPY (EGD) WITH PROPOFOL N/A 06/30/2021   Procedure: ESOPHAGOGASTRODUODENOSCOPY (EGD) WITH PROPOFOL;  Surgeon: Lesly Rubenstein, MD;  Location: [facility] ENDOSCOPY;  Service: Endoscopy;  Laterality: N/A;   EYE SURGERY Bilateral 2016   HERNIA REPAIR Right 2008   Inguinal hernia Repair, ventral hernia repair   JOINT REPLACEMENT Right 2008   knee   KNEE ARTHROSCOPY Right    LUMBAR LAMINECTOMY/DECOMPRESSION MICRODISCECTOMY Right 03/21/2013   Procedure: Right Lumbar five-Sacral one Laminectomy for Synovial Cyst;  Surgeon: Faythe Ghee, MD;  Location: [facility] NEURO ORS;  Service: Neurosurgery;  Laterality: Right;  right   OOPHORECTOMY     REVERSE SHOULDER ARTHROPLASTY Right 02/22/2018   Procedure: REVERSE SHOULDER ARTHROPLASTY;  Surgeon: Corky Mull, MD;  Location: [facility] ORS;  Service: Orthopedics;  Laterality: Right;   TUBAL LIGATION     UNILATERAL SALPINGECTOMY Left 04/09/2016   Procedure: UNILATERAL SALPINGECTOMY;  Surgeon: Gae Dry, MD;  Location: [facility] ORS;  Service: Gynecology;  Laterality: Left;   UPPER GI ENDOSCOPY  2010   with biopsy of gastric erosion   VAGINAL HYSTERECTOMY N/A 04/09/2016   Procedure: HYSTERECTOMY VAGINAL;  Surgeon: Gae Dry, MD;  Location: [facility] ORS;  Service: Gynecology;  Laterality: N/A;   VAGINAL HYSTERECTOMY  2017   and bladder tack    Allergies: Codeine, Levofloxacin, Oxycodone, Ultram [tramadol], Nitrofurantoin, Nsaids, Sulfacetamide, Adhesive [tape], Azithromycin, Ciprofloxacin, Ketorolac, Sulfa antibiotics, Tolmetin, and Zofran [ondansetron hcl]  Medications: Prior to Admission medications   Medication Sig Start Date End Date Taking? Authorizing Provider  acetaminophen (TYLENOL) 500 MG tablet Take 1,000 mg by mouth 2 (two) times daily as needed for moderate pain.   Yes [provider]  albuterol (PROVENTIL) (2.5 MG/3ML) 0.083% nebulizer solution Take 3 mLs (2.5 mg total) by nebulization every 6 (six) hours as needed for wheezing or shortness of breath. 11/10/21  Yes Abernathy, Alyssa, NP  albuterol (VENTOLIN HFA) 108 (90 Base) MCG/ACT inhaler Inhale 2 puffs into the lungs every 4 (four) hours as needed for wheezing or shortness of breath. 11/10/21  Yes Abernathy, Yetta Flock, NP  aspirin EC 81 MG tablet Take 81 mg by mouth at bedtime.    Yes [provider]  BLACK ELDERBERRY PO Take 1 Dose by mouth 2 (two) times a day.   Yes [provider]  Calcium Carbonate-Vitamin D 600-400 MG-UNIT tablet Take 1 tablet by mouth 2 (two) times daily.   Yes [provider]  cholecalciferol (VITAMIN D) 1000 UNITS tablet Take 1,000 Units by mouth at bedtime.    Yes [provider]  citalopram (CELEXA) 10 MG tablet Take 1 tablet (10 mg total) by mouth daily. 03/20/22  Yes McDonough, Lauren K, PA-C  cyanocobalamin 500 MCG tablet Take 500 mcg by mouth daily.   Yes [provider]  Dextran 70-Hypromellose (ARTIFICIAL TEARS) 0.1-0.3 % SOLN Apply 1-2 drops to eye daily.   Yes [provider]  diclofenac Sodium (VOLTAREN) 1 % GEL Apply 4 g topically 4 (four) times daily. 12/05/20  Yes McDonough, Lauren K, PA-C  diltiazem (CARDIZEM CD) 120 MG 24 hr capsule Take 120 mg by mouth at bedtime.  05/18/16  Yes [provider]  diphenhydrAMINE (BENADRYL) 25 MG tablet Take 25-50 mg by mouth daily as needed for itching.   Yes [provider]  docusate sodium (COLACE) 100 MG capsule Take 200 mg by mouth at bedtime as needed for moderate constipation.    Yes [provider]  EPINEPHrine (EPIPEN 2-PAK) 0.3 mg/0.3 mL IJ SOAJ  injection use as directed for severe allergy reaction 03/12/20  Yes Devona Konig A, MD  fenofibrate 160 MG tablet TAKE 1 TABLET DAILY FOR CHOLESTEROL 02/05/22  Yes McDonough, Lauren K, PA-C  fluticasone (FLONASE) 50 MCG/ACT nasal spray Use 2 sprays in each nostril daily, 09/11/21  Yes McDonough, Lauren K, PA-C  folic acid (FOLVITE) 213 MCG tablet Take 400 mcg by mouth daily.   Yes [provider]  guaiFENesin (MUCINEX) 600 MG 12 hr tablet Take 600 mg by mouth at bedtime as needed (congestion).   Yes [provider]  HYDROcodone-acetaminophen (NORCO/VICODIN) 5-325 MG tablet Take 1 tablet by mouth every 6 (six) hours as needed for moderate pain. 04/28/22  Yes Fisher, Linden Dolin, PA-C  Hypromellose (ARTIFICIAL TEARS OP) Place 1-2 drops into both eyes daily as needed (dry eyes).   Yes [provider]  ketotifen (ZADITOR) 0.025 % ophthalmic solution Place 1 drop into both eyes 2 (two) times  daily as needed (allergies).   Yes [provider]  levocetirizine (XYZAL) 5 MG tablet Take 1 tablet (5 mg total) by mouth every evening. 06/26/21  Yes McDonough, Lauren K, PA-C  levothyroxine (SYNTHROID, LEVOTHROID) 50 MCG tablet Take 50 mcg by mouth daily before breakfast. Brand Name only   Yes [provider]  losartan (COZAAR) 25 MG tablet Take 25 mg by mouth daily. 07/22/21  Yes [provider]  Magnesium 250 MG TABS Take 1 tablet by mouth daily.   Yes [provider]  Melatonin 5 MG TABS Take 1 tablet by mouth at bedtime. For sleep   Yes [provider]  Multiple Vitamins-Minerals (MULTIVITAMIN WITH MINERALS) tablet Take 1 tablet by mouth daily.   Yes [provider]  OXYGEN Inhale into the lungs. 2 litre at night   Yes [provider]  pantoprazole (PROTONIX) 40 MG tablet Take 1 tablet (40 mg total) by mouth 2 (two) times daily. 02/24/22  Yes Lavera Guise, MD  pyridoxine (B-6) 100 MG tablet Take 100 mg by mouth daily.   Yes [provider]  sodium chloride (OCEAN) 0.65 % SOLN nasal spray Place 1 spray into both nostrils 2 (two) times daily.   Yes [provider]  sucralfate (CARAFATE) 1 g tablet Take 3 times daily with meals. 09/09/20  Yes McDonough, Si Gaul, PA-C  Vitamin A 7.5 MG (25000 UT) CAPS Take 1 tablet by mouth 2 (two) times daily.   Yes [provider]  zinc gluconate 50 MG tablet Take 50 mg by mouth daily.   Yes [provider]  azithromycin (ZITHROMAX) 250 MG tablet Take one tab a day for 10 days for uri Patient not taking: Reported on 05/07/2022 04/02/22   Mylinda Latina, PA-C  chlorpheniramine-HYDROcodone (TUSSIONEX) 10-8 MG/5ML Take 5 mLs by mouth every 12 (twelve) hours as needed for cough. Patient not taking: Reported on 05/07/2022 04/03/22   Mylinda Latina, PA-C  fluconazole (DIFLUCAN) 150 MG tablet Take 1 tab po once and repeat in 3 days for thrush Patient not taking:  Reported on 05/07/2022 04/09/22   Mylinda Latina, PA-C  methylPREDNISolone (MEDROL DOSEPAK) 4 MG TBPK tablet Take 6 pills on day one then decrease by 1 pill each day Patient not taking: Reported on 05/07/2022 04/28/22   Versie Starks, PA-C  predniSONE (DELTASONE) 10 MG tablet Use as directed for 12 days taper Patient not taking: Reported on 05/07/2022 04/02/22   Mylinda Latina, PA-C     Family History  Problem Relation Age  of Onset   Pulmonary fibrosis Mother    Melanoma Father    COPD Sister    Cardiomyopathy Sister        and arrhythmia   Atrial fibrillation Sister    COPD Brother    Pulmonary fibrosis Brother    Cancer Brother    Breast cancer Cousin        paternal 1st    Social History   Socioeconomic History   Marital status: Married    Spouse name: Not on file   Number of children: Not on file   Years of education: Not on file   Highest education level: Not on file  Occupational History   Not on file  Tobacco Use   Smoking status: Former    Types: Cigarettes    Quit date: 68    Years since quitting: 56.7   Smokeless tobacco: Never  Vaping Use   Vaping Use: Never used  Substance and Sexual Activity   Alcohol use: Yes   Drug use: No   Sexual activity: Not Currently  Other Topics Concern   Not on file  Social History Narrative   Not on file   Social Determinants of Health   Financial Resource Strain: Not on file  Food Insecurity: Not on file  Transportation Needs: Not on file  Physical Activity: Not on file  Stress: Not on file  Social Connections: Not on file    Review of Systems: A 12 point ROS discussed and pertinent positives are indicated in the HPI above.  All other systems are negative.   Vital Signs: There were no vitals taken for this visit.   Physical Exam Constitutional:      General: She is not in acute distress. HENT:     Head: Normocephalic.     Mouth/Throat:     Mouth: Mucous membranes are dry.  Eyes:     General: No  scleral icterus. Cardiovascular:     Rate and Rhythm: Normal rate and regular rhythm.     Heart sounds: Normal heart sounds.  Pulmonary:     Effort: Pulmonary effort is normal.  Abdominal:     General: There is no distension.  Skin:    General: Skin is warm and dry.  Neurological:     Mental Status: She is alert and oriented to person, place, and time.     Imaging: MR Thoracic Spine 05/06/22   Acute to subacute T12 superior endplate fracture with approximately 40% height loss and minimal retropulsion.  Labs:  CBC: Recent Labs    09/30/21 0842 11/22/21 1542  WBC 4.9 11.5*  HGB 12.2 11.0*  HCT 37.7 34.3*  PLT 337 316    COAGS: No results for input(s): "INR", "APTT" in the last 8760 hours.  BMP: Recent Labs    09/30/21 0842 11/22/21 1542  NA 140 135  K 4.6 4.0  CL 102 99  CO2 25 27  GLUCOSE 105* 103*  BUN 17 15  CALCIUM 10.5* 9.8  CREATININE 0.75 0.67  GFRNONAA  --  >60    LIVER FUNCTION TESTS: Recent Labs    09/30/21 0842 11/22/21 1542  BILITOT 0.2 0.4  AST 24 27  ALT 22 25  ALKPHOS 62 66  PROT 6.7 7.8  ALBUMIN 4.6 4.2     Assessment and Plan: Bracha Frankowski is a 76 year old female who has suffered subacute osteoporotic fracture of the T12 vertebra.   History and exam have demonstrated the following:  Acute/Subacute fracture by imaging  dated 05/06/22, Pain on exam concordant with level of fracture, Failure of conservative therapy and pain refractory to narcotic pain mediation, and Significant disability on the [place] with 24/24 positive symptoms, reflecting significant impact/impairment of (ADLs)   ICD-10-CM Codes that Support Medical Necessity ([email or web address]?[email or web address])  M80.08XA    Age-related osteoporosis with current pathological fracture, vertebra(e), initial encounter for fracture   Plan:  T12 vertebral body augmentation with balloon kyphoplasty   Post-procedure disposition: outpatient [facility]-  Medication holds: ASA   The patient has suffered a fracture of the T12 vertebral body. It is recommended that patients aged 57 years or older be evaluated for possible testing or treatment of osteoporosis. A copy of this consult report is sent to the patient's referring physician.  Advanced Care Plan: The patient did not want to provide an [place] at the time of this visit     Total time spent on today's visit was over 40 Minutes , including both face-to-face time and non face-to-face time, personally spent on review of chart (including labs and relevant imaging), discussing further workup and treatment options, referral to specialist if needed, reviewing outside records if pertinent, answering patient questions, and coordinating care regarding T12 vertebral fracture as well as management strategy.    Electronically Signed: Suzette Battiest, MD 05/07/2022, 3:56 PM

## 2022-05-08 ENCOUNTER — Other Ambulatory Visit: Payer: Self-pay | Admitting: Optometry

## 2022-05-08 LAB — CBC WITH DIFFERENTIAL/PLATELET
Absolute Monocytes: 1021 cells/uL — ABNORMAL HIGH (ref 200–950)
Basophils Absolute: 104 cells/uL (ref 0–200)
Basophils Relative: 1.4 %
Eosinophils Absolute: 133 cells/uL (ref 15–500)
Eosinophils Relative: 1.8 %
HCT: 37.7 % (ref 35.0–45.0)
Hemoglobin: 12.5 g/dL (ref 11.7–15.5)
Lymphs Abs: 1510 cells/uL (ref 850–3900)
MCH: 28.7 pg (ref 27.0–33.0)
MCHC: 33.2 g/dL (ref 32.0–36.0)
MCV: 86.5 fL (ref 80.0–100.0)
MPV: 9.7 fL (ref 7.5–12.5)
Monocytes Relative: 13.8 %
Neutro Abs: 4632 cells/uL (ref 1500–7800)
Neutrophils Relative %: 62.6 %
Platelets: 507 10*3/uL — ABNORMAL HIGH (ref 140–400)
RBC: 4.36 10*6/uL (ref 3.80–5.10)
RDW: 13.2 % (ref 11.0–15.0)
Total Lymphocyte: 20.4 %
WBC: 7.4 10*3/uL (ref 3.8–10.8)

## 2022-05-08 LAB — COMPLETE METABOLIC PANEL WITH GFR
AG Ratio: 1.7 (calc) (ref 1.0–2.5)
ALT: 20 U/L (ref 6–29)
AST: 20 U/L (ref 10–35)
Albumin: 4.3 g/dL (ref 3.6–5.1)
Alkaline phosphatase (APISO): 71 U/L (ref 37–153)
BUN: 18 mg/dL (ref 7–25)
CO2: 26 mmol/L (ref 20–32)
Calcium: 9.1 mg/dL (ref 8.6–10.4)
Chloride: 100 mmol/L (ref 98–110)
Creat: 0.73 mg/dL (ref 0.60–1.00)
Globulin: 2.5 g/dL (calc) (ref 1.9–3.7)
Glucose, Bld: 82 mg/dL (ref 65–139)
Potassium: 3.9 mmol/L (ref 3.5–5.3)
Sodium: 138 mmol/L (ref 135–146)
Total Bilirubin: 0.3 mg/dL (ref 0.2–1.2)
Total Protein: 6.8 g/dL (ref 6.1–8.1)
eGFR: 85 mL/min/{1.73_m2} (ref >=60)

## 2022-05-08 LAB — SPECIMEN COMPROMISED

## 2022-05-12 ENCOUNTER — Ambulatory Visit: Payer: Medicare Other | Admitting: Neurosurgery

## 2022-05-12 ENCOUNTER — Ambulatory Visit
Admission: RE | Admit: 2022-05-12 | Discharge: 2022-05-12 | Disposition: A | Discharge status: Home / Self Care | Payer: Medicare Other | Source: Ambulatory Visit | Attending: Family Medicine | Admitting: Family Medicine

## 2022-05-12 DIAGNOSIS — M4854XA Collapsed vertebra, not elsewhere classified, thoracic region, initial encounter for fracture: Secondary | ICD-10-CM | POA: Diagnosis not present

## 2022-05-12 DIAGNOSIS — S22080A Wedge compression fracture of T11-T12 vertebra, initial encounter for closed fracture: Secondary | ICD-10-CM

## 2022-05-12 HISTORY — PX: IR KYPHO THORACIC WITH BONE BIOPSY: IMG5518

## 2022-05-12 MED ORDER — CEFAZOLIN SODIUM-DEXTROSE 2-4 GM/100ML-% IV SOLN
2.0000 g | INTRAVENOUS | Status: AC
  Administered 2022-05-12: 2 g via INTRAVENOUS

## 2022-05-12 MED ORDER — MIDAZOLAM HCL 2 MG/2ML IJ SOLN
1.0000 mg | INTRAMUSCULAR | Status: DC | PRN
  Administered 2022-05-12: 1 mg via INTRAVENOUS
  Administered 2022-05-12 (×2): 0.5 mg via INTRAVENOUS
  Administered 2022-05-12: 1 mg via INTRAVENOUS
  Administered 2022-05-12: 0.5 mg via INTRAVENOUS

## 2022-05-12 MED ORDER — FENTANYL CITRATE PF 50 MCG/ML IJ SOSY
25.0000 ug | PREFILLED_SYRINGE | INTRAMUSCULAR | Status: DC | PRN
  Administered 2022-05-12 (×2): 25 ug via INTRAVENOUS
  Administered 2022-05-12: 50 ug via INTRAVENOUS
  Administered 2022-05-12: 25 ug via INTRAVENOUS

## 2022-05-12 MED ORDER — ACETAMINOPHEN 10 MG/ML IV SOLN
1000.0000 mg | Freq: Once | INTRAVENOUS | Status: AC
  Administered 2022-05-12: 1000 mg via INTRAVENOUS

## 2022-05-12 MED ORDER — SODIUM CHLORIDE 0.9 % IV SOLN
INTRAVENOUS | Status: DC
Start: 2022-05-12 — End: 2022-05-13

## 2022-05-12 NOTE — Progress Notes (Signed)
Pt back in nursing recovery area. Pt still drowsy from procedure but will wake up when spoken to. Pt follows commands, talks in complete sentences and has no complaints at this time. Pt will remain in nursing station until discharge.  ?

## 2022-05-12 NOTE — Discharge Instructions (Signed)
Kyphoplasty Post Procedure Discharge Instructions  May resume a regular diet and any medications that you routinely take (including pain medications). However, if you are taking Aspirin or an anticoagulant/blood thinner you will be told when you can resume taking these by the healthcare provider. No driving day of procedure. The day of your procedure take it easy. You may use an ice pack as needed to injection sites on back.  Ice to back 30 minutes on and 30 minutes off, as needed. May remove bandaids tomorrow after taking a shower. Replace daily with a clean bandaid until healed.  Do not lift anything heavier than a milk jug for 1-2 weeks or determined by your physician.  Follow up with your physician in 2 weeks.    Please contact our office at [phone] for the following symptoms or if you have any questions:  Fever greater than 100 degrees Increased swelling, pain, or redness at injection site. Increased back and/or leg pain New numbness or change in symptoms from before the procedure.    Thank you for visiting  Imaging.  May resume aspirin immediately after procedure. 

## 2022-05-14 DIAGNOSIS — E042 Nontoxic multinodular goiter: Secondary | ICD-10-CM | POA: Diagnosis not present

## 2022-05-14 DIAGNOSIS — E039 Hypothyroidism, unspecified: Secondary | ICD-10-CM | POA: Diagnosis not present

## 2022-05-14 DIAGNOSIS — R739 Hyperglycemia, unspecified: Secondary | ICD-10-CM | POA: Diagnosis not present

## 2022-05-15 ENCOUNTER — Other Ambulatory Visit: Payer: Self-pay | Admitting: Physician Assistant

## 2022-05-15 DIAGNOSIS — E782 Mixed hyperlipidemia: Secondary | ICD-10-CM

## 2022-05-18 ENCOUNTER — Ambulatory Visit: Payer: Medicare Other | Admitting: Physician Assistant

## 2022-05-19 ENCOUNTER — Telehealth: Payer: Self-pay

## 2022-05-19 ENCOUNTER — Ambulatory Visit
Admission: RE | Admit: 2022-05-19 | Discharge: 2022-05-19 | Disposition: A | Discharge status: Home / Self Care | Payer: Medicare Other | Source: Ambulatory Visit | Attending: Interventional Radiology | Admitting: Interventional Radiology

## 2022-05-19 ENCOUNTER — Other Ambulatory Visit: Payer: Self-pay | Admitting: Interventional Radiology

## 2022-05-19 DIAGNOSIS — S22080A Wedge compression fracture of T11-T12 vertebra, initial encounter for closed fracture: Secondary | ICD-10-CM

## 2022-05-19 DIAGNOSIS — Z9889 Other specified postprocedural states: Secondary | ICD-10-CM | POA: Diagnosis not present

## 2022-05-19 DIAGNOSIS — M8588 Other specified disorders of bone density and structure, other site: Secondary | ICD-10-CM | POA: Diagnosis not present

## 2022-05-19 DIAGNOSIS — M546 Pain in thoracic spine: Secondary | ICD-10-CM | POA: Diagnosis not present

## 2022-05-19 DIAGNOSIS — S22080D Wedge compression fracture of T11-T12 vertebra, subsequent encounter for fracture with routine healing: Secondary | ICD-10-CM | POA: Diagnosis not present

## 2022-05-19 HISTORY — PX: IR RADIOLOGIST EVAL & MGMT: IMG5224

## 2022-05-19 NOTE — Telephone Encounter (Signed)
Phone call to pt to follow up from her kyphoplasty on 05/12/22. Pt reports her "stabbing" pain has subsided but she is still have throbbing pain around her ribs and lower back. Pt denies any signs of infection, redness at the site, draining or fever. Pt. Is scheduled for an in person follow up on 05/19/22. Pt advised to call back if anything were to change or any concerns arise and we will arrange an in person appointment. Pt verbalized understanding.

## 2022-05-19 NOTE — Progress Notes (Signed)
IR Brief Note   I saw the patient today in brief follow-up following her T12 vertebral body augmentation using balloon kyphoplasty on May 12, 2022.  Patient does report that her sharp stabbing pain has improved, and now only occurs with particular exaggerated movements.  She does report, however, that she has new general achiness in the lower back down to her pelvis and along her sides.  The pain is somewhat constant and does continue to affect her ability to stand for prolonged periods of time and performed chores around the house.    I obtained a repeat radiograph of the thoracolumbar spine, which demonstrated expected postoperative changes following her T12 kyphoplasty.  No complicating features are identified.  No new fractures identified.    I reassured the patient that her symptoms may be due to generalized healing after the kyphoplasty procedure, and those would be expected to continue to improve over the course of the next week.  She does have an appointment with neurosurgery Dr. Cari Caraway on Thursday,and he may be offer additional insight to other potential etiologies for her back pain.    I will plan to follow-up with patient in another week.    Albin Felling, MD  Vascular and Interventional Radiology 05/19/2022 4:41 PM

## 2022-05-20 ENCOUNTER — Other Ambulatory Visit: Payer: Self-pay | Admitting: Interventional Radiology

## 2022-05-20 DIAGNOSIS — R739 Hyperglycemia, unspecified: Secondary | ICD-10-CM | POA: Diagnosis not present

## 2022-05-20 DIAGNOSIS — Z712 Person consulting for explanation of examination or test findings: Secondary | ICD-10-CM

## 2022-05-20 DIAGNOSIS — E039 Hypothyroidism, unspecified: Secondary | ICD-10-CM | POA: Diagnosis not present

## 2022-05-20 DIAGNOSIS — E042 Nontoxic multinodular goiter: Secondary | ICD-10-CM | POA: Diagnosis not present

## 2022-05-20 NOTE — Progress Notes (Signed)
Referring Physician:  Duffy Bruce, MD [place],  [place] [id number]  Primary Physician:  Mylinda Latina, PA-C  History of Present Illness: 05/20/2022 [name] is here today with a chief complaint of mid and low back pain that doesn't radiate into the legs.  Having pains in September 19.  She had a kyphoplasty on October 3.  She continues to have severe pain with ambulation.  Standing, chores, walking make it worse.  Medication has helped but she is in a life altering amount of pain.   Bowel/Bladder Dysfunction: none  Conservative measures:  Physical therapy:  has not participated in recently for this flare.  Multimodal medical therapy including regular antiinflammatories: hydrocodone, methocarbamol, tylenol, medrol dosepak Injections:  has had epidural steroid injections in the past 03/11/2021: Right L5-S1 transforaminal ESI (70% relief) 02/21/2021: Left trochanteric bursa injection  01/14/2021: Right L5-S1 and right L4-5 transforaminal ESI (mild relief of lower extremity pain in the lower extremity) 05/10/2020: MBB to the bilateral L4-5 and L5-S1 facet joints (9/10 to 2/10, patient states that she had continued moderate to severe pain while at home with activity) 04/22/2020: MBB to the bilateral L4-5 and L5-S1 facet joints (7/10 to 2/10) 10/03/2018: Left trochanteric bursa injection (moderate relief) 05/10/2018: Left trochanteric bursa injection performed by Marylee Floras (good relief)   Past Surgery: T12 Kyphoplasty on 05/12/2022 by Dr. Denna Haggard Lumbar Laminectomy in 2014 by Dr. Timmie Foerster has no symptoms of cervical myelopathy.  The symptoms are causing a significant impact on the patient's life.   Review of Systems:  A 10 point review of systems is negative, except for the pertinent positives and negatives detailed in the HPI.  Past Medical History: Past Medical History:  Diagnosis Date   Arthritis    osteo   Asthma     needs rescue inhaler few times a year   Claustrophobia 04/21/2020   Depression    Dizziness    light headed spells but it has been a while   Dysrhythmia    tachycardia.(cardizem). brady during procedures   Fibromyalgia    GERD (gastroesophageal reflux disease)    gastritis   Headache(784.0)    Heart murmur 2019   aortic. dr. Nehemiah Massed not worried about this   Hyperlipidemia    Hypertension    Hypothyroidism    Macular degeneration    Migraine    Orthopnea    Pneumonia    long time ago (greater than 5 years ago)   PONV (postoperative nausea and vomiting)    very anxious during cataract surgery. 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 cope.  all benign.  inflammatory process going on in bladder   BREAST BIOPSY Right    benign   BREAST SURGERY Right    lumpectomy   CATARACT EXTRACTION W/PHACO Left 12/25/2014   Procedure: CATARACT EXTRACTION PHACO AND INTRAOCULAR LENS PLACEMENT ([place]);  Surgeon: Birder Robson, MD;  Location: [facility] ORS;  Service: Ophthalmology;  Laterality: Left;  [place] 00:32 AP% 20.0 CDE 6.49   COLONOSCOPY W/ BIOPSIES     COLONOSCOPY W/ POLYPECTOMY  2002, 2004, 2005  adenomatous polyps removed   COLONOSCOPY WITH PROPOFOL N/A 08/23/2017   Procedure: COLONOSCOPY WITH PROPOFOL;  Surgeon: Manya Silvas, MD;  Location: [facility] ENDOSCOPY;  Service: Endoscopy;  Laterality: N/A;   COLONOSCOPY WITH PROPOFOL N/A 11/08/2017   Procedure: COLONOSCOPY WITH PROPOFOL;  Surgeon: Manya Silvas, MD;  Location: [facility] ENDOSCOPY;  Service: Endoscopy;  Laterality: N/A;   COLONOSCOPY WITH PROPOFOL N/A 06/30/2021   Procedure: COLONOSCOPY WITH PROPOFOL;  Surgeon: Lesly Rubenstein, MD;  Location: [facility]  ENDOSCOPY;  Service: Endoscopy;  Laterality: N/A;   CYSTOCELE REPAIR N/A 04/09/2016   Procedure: ANTERIOR REPAIR (CYSTOCELE);  Surgeon: Gae Dry, MD;  Location: [facility] ORS;  Service: Gynecology;  Laterality: N/A;   DIAGNOSTIC LAPAROSCOPY  2008   removed both ovaries with cysts, tubes and fibroids   DILATION AND CURETTAGE OF UTERUS  1973   ESOPHAGOGASTRODUODENOSCOPY (EGD) WITH PROPOFOL N/A 08/23/2017   Procedure: ESOPHAGOGASTRODUODENOSCOPY (EGD) WITH PROPOFOL;  Surgeon: Manya Silvas, MD;  Location: [facility] ENDOSCOPY;  Service: Endoscopy;  Laterality: N/A;   ESOPHAGOGASTRODUODENOSCOPY (EGD) WITH PROPOFOL N/A 06/30/2021   Procedure: ESOPHAGOGASTRODUODENOSCOPY (EGD) WITH PROPOFOL;  Surgeon: Lesly Rubenstein, MD;  Location: [facility] ENDOSCOPY;  Service: Endoscopy;  Laterality: N/A;   EYE SURGERY Bilateral 2016   HERNIA REPAIR Right 2008   Inguinal hernia Repair, ventral hernia repair   IR KYPHO THORACIC WITH BONE BIOPSY  05/12/2022   IR RADIOLOGIST EVAL & MGMT  05/19/2022   JOINT REPLACEMENT Right 2008   knee   KNEE ARTHROSCOPY Right    LUMBAR LAMINECTOMY/DECOMPRESSION MICRODISCECTOMY Right 03/21/2013   Procedure: Right Lumbar five-Sacral one Laminectomy for Synovial Cyst;  Surgeon: Faythe Ghee, MD;  Location: [place] NEURO ORS;  Service: Neurosurgery;  Laterality: Right;  right   OOPHORECTOMY     REVERSE SHOULDER ARTHROPLASTY Right 02/22/2018   Procedure: REVERSE SHOULDER ARTHROPLASTY;  Surgeon: Corky Mull, MD;  Location: [facility] ORS;  Service: Orthopedics;  Laterality: Right;   TUBAL LIGATION     UNILATERAL SALPINGECTOMY Left 04/09/2016   Procedure: UNILATERAL SALPINGECTOMY;  Surgeon: Gae Dry, MD;  Location: [facility] ORS;  Service: Gynecology;  Laterality: Left;   UPPER GI ENDOSCOPY  2010   with biopsy of gastric erosion   VAGINAL HYSTERECTOMY N/A 04/09/2016   Procedure: HYSTERECTOMY VAGINAL;  Surgeon: Gae Dry, MD;  Location: [facility] ORS;  Service: Gynecology;  Laterality: N/A;    VAGINAL HYSTERECTOMY  2017   and bladder tack    Allergies: Allergies as of 05/21/2022 - Review Complete 05/07/2022  Allergen Reaction Noted   Codeine Shortness Of Breath and Itching 03/07/2013   Levofloxacin  11/08/2013   Oxycodone Itching and Shortness Of Breath 02/24/2016   Ultram [tramadol] Itching and Other (See Comments) 03/07/2013   Nitrofurantoin Diarrhea 03/07/2013   Nsaids Other (See Comments) 03/07/2013   Sulfacetamide Rash 08/20/2017   Adhesive [tape] Other (See Comments) 02/11/2018   Azithromycin  02/05/2014   Ciprofloxacin Other (See Comments) 03/07/2013   Ketorolac Itching 12/14/2013   Sulfa antibiotics Itching, Rash, and Other (See Comments) 03/07/2013   Tolmetin  05/29/2014   Zofran [ondansetron hcl] Other (See Comments) 02/11/2018    Medications: No outpatient medications have been marked as taking for the 05/21/22 encounter (Appointment) with Meade Maw, MD.    Social History: Social History   Tobacco Use   Smoking status: Former    Types: Cigarettes    Quit date: 1967    Years since quitting: 56.8   Smokeless tobacco: Never  Vaping Use   Vaping Use:  Never used  Substance Use Topics   Alcohol use: Yes   Drug use: No    Family Medical History: Family History  Problem Relation Age of Onset   Pulmonary fibrosis Mother    Melanoma Father    COPD Sister    Cardiomyopathy Sister        and arrhythmia   Atrial fibrillation Sister    COPD Brother    Pulmonary fibrosis Brother    Cancer Brother    Breast cancer Cousin        paternal 1st    Physical Examination: There were no vitals filed for this visit.  General: Patient is well developed, well nourished, calm, collected, and in no apparent distress. Attention to examination is appropriate.  Neck:   Supple.  Full range of motion.  Respiratory: Patient is breathing without any difficulty.   NEUROLOGICAL:     Awake, alert, oriented to person, place, and time.  Speech is clear and  fluent. Fund of knowledge is appropriate.   Cranial Nerves: Pupils equal round and reactive to light.  Facial tone is symmetric.  Facial sensation is symmetric. Shoulder shrug is symmetric. Tongue protrusion is midline.  There is no pronator drift.  ROM of spine: full.    Strength: Side Biceps Triceps Deltoid Interossei Grip Wrist Ext. Wrist Flex.  R '5 5 5 5 5 5 5  '$ L '5 5 5 5 5 5 5   '$ Side Iliopsoas Quads Hamstring PF DF EHL  R '5 5 5 5 5 5  '$ L '5 5 5 5 5 5   '$ Reflexes are 1+ and symmetric at the biceps, triceps, brachioradialis, patella and achilles.   Hoffman's is absent.   Bilateral upper and lower extremity sensation is intact to light touch.    No evidence of dysmetria noted.  Gait is normal.     Medical Decision Making  Imaging: Xrays 05/19/22 T12 kyphoplasty with some coronal plane deformity (mild)  I have personally reviewed the images and agree with the above interpretation.  Assessment and Plan: Ms. Ogden is a pleasant 76 y.o. female with T12 compression fracture status post kyphoplasty.  She continues to have severe pain.  I will order an LSO, which may be easier to obtain in the TLSO that was previously ordered.  Hopefully this will help with her pain.  We discussed the natural history of compression fractures, which can be frustrating to treat.  I will see her back in 6 weeks at which time I hope she will be doing much better.  If not, we will consider surgical fixation.  She will get repeat x-rays at her next visit.  She has a chronic anterolisthesis from a unilateral pars defect at L5-S1.  We may reevaluate that once she is healed from this fracture.   I spent a total of 30 minutes in face-to-face and non-face-to-face activities related to this patient's care today.  Thank you for involving me in the care of this patient.      Ida Uppal K. Izora Ribas MD, [facility] Neurosurgery

## 2022-05-21 ENCOUNTER — Encounter: Payer: Self-pay | Admitting: Neurosurgery

## 2022-05-21 ENCOUNTER — Ambulatory Visit (INDEPENDENT_AMBULATORY_CARE_PROVIDER_SITE_OTHER): Payer: Medicare Other | Admitting: Neurosurgery

## 2022-05-21 VITALS — BP 138/68 | Ht 60.0 in | Wt 146.0 lb

## 2022-05-21 DIAGNOSIS — S22080A Wedge compression fracture of T11-T12 vertebra, initial encounter for closed fracture: Secondary | ICD-10-CM

## 2022-05-21 DIAGNOSIS — S22080D Wedge compression fracture of T11-T12 vertebra, subsequent encounter for fracture with routine healing: Secondary | ICD-10-CM

## 2022-05-21 NOTE — Patient Instructions (Signed)
An order for an LSO brace has been faxed to [facility]. They will contact you. If you have any issues with the brace, their number is [phone]

## 2022-05-26 ENCOUNTER — Ambulatory Visit (INDEPENDENT_AMBULATORY_CARE_PROVIDER_SITE_OTHER): Payer: Medicare Other | Admitting: Internal Medicine

## 2022-05-26 ENCOUNTER — Ambulatory Visit
Admission: RE | Admit: 2022-05-26 | Discharge: 2022-05-26 | Disposition: A | Discharge status: Home / Self Care | Payer: Medicare Other | Source: Ambulatory Visit | Attending: Interventional Radiology | Admitting: Interventional Radiology

## 2022-05-26 ENCOUNTER — Encounter: Payer: Self-pay | Admitting: Internal Medicine

## 2022-05-26 VITALS — BP 134/71 | HR 110 | Temp 98.4°F | Resp 16 | Ht 61.0 in | Wt 145.0 lb

## 2022-05-26 DIAGNOSIS — Z712 Person consulting for explanation of examination or test findings: Secondary | ICD-10-CM

## 2022-05-26 DIAGNOSIS — U071 COVID-19: Secondary | ICD-10-CM | POA: Diagnosis not present

## 2022-05-26 DIAGNOSIS — Z9889 Other specified postprocedural states: Secondary | ICD-10-CM | POA: Diagnosis not present

## 2022-05-26 DIAGNOSIS — R0602 Shortness of breath: Secondary | ICD-10-CM

## 2022-05-26 DIAGNOSIS — I25119 Atherosclerotic heart disease of native coronary artery with unspecified angina pectoris: Secondary | ICD-10-CM

## 2022-05-26 DIAGNOSIS — S22080D Wedge compression fracture of T11-T12 vertebra, subsequent encounter for fracture with routine healing: Secondary | ICD-10-CM | POA: Diagnosis not present

## 2022-05-26 DIAGNOSIS — G4734 Idiopathic sleep related nonobstructive alveolar hypoventilation: Secondary | ICD-10-CM | POA: Diagnosis not present

## 2022-05-26 DIAGNOSIS — J4520 Mild intermittent asthma, uncomplicated: Secondary | ICD-10-CM | POA: Diagnosis not present

## 2022-05-26 HISTORY — PX: IR RADIOLOGIST EVAL & MGMT: IMG5224

## 2022-05-26 NOTE — Patient Instructions (Signed)
Asthma, Adult  Asthma is a condition that causes swelling and narrowing of the airways. These are the passages that lead from the nose and mouth down into the lungs. When asthma symptoms get worse it is called an asthma attack or flare. This can make it hard to breathe. Asthma flares can range from minor to life-threatening. There is no cure for asthma, but medicines and lifestyle changes can help to control it. What are the causes? It is not known exactly what causes asthma, but certain things can cause asthma symptoms to get worse (triggers). What can trigger an asthma attack? Cigarette smoke. Mold. Dust. Your pet's skin flakes (dander). Cockroaches. Pollen. Air pollution (like household cleaners, wood smoke, smog, or chemical odors). What are the signs or symptoms? Trouble breathing (shortness of breath). Coughing. Making high-pitched whistling sounds when you breathe, most often when you breathe out (wheezing). Chest tightness. Tiredness with little activity. Poor exercise tolerance. How is this treated? Controller medicines that help prevent asthma symptoms. Fast-acting reliever or rescue medicines. These give short-term relief of asthma symptoms. Allergy medicines if your attacks are brought on by allergens. Medicines to help control the body's defense (immune) system. Staying away from the things that cause asthma attacks. Follow these instructions at home: Avoiding triggers in your home Do not allow anyone to smoke in your home. Limit use of fireplaces and wood stoves. Get rid of pests (such as roaches and mice) and their droppings. Keep your home clean. Clean your floors. Dust regularly. Use cleaning products that do not smell. Wash bed sheets and blankets every week in hot water. Dry them in a dryer. Have someone vacuum when you are not home. Change your heating and air conditioning filters often. Use blankets that are made of polyester or cotton. General  instructions Take over-the-counter and prescription medicines only as told by your doctor. Do not smoke or use any products that contain nicotine or tobacco. If you need help quitting, ask your doctor. Stay away from secondhand smoke. Avoid doing things outdoors when allergen counts are high and when air quality is low. Warm up before you exercise. Take time to cool down after exercise. Use a peak flow meter as told by your doctor. A peak flow meter is a tool that measures how well your lungs are working. Keep track of the peak flow meter's readings. Write them down. Follow your asthma action plan. This is a written plan for taking care of your asthma and treating your attacks. Make sure you get all the shots (vaccines) that your doctor recommends. Ask your doctor about a flu shot and a pneumonia shot. Keep all follow-up visits. Contact a doctor if: You have wheezing, shortness of breath, or a cough even while taking medicine to prevent attacks. The mucus you cough up (sputum) is thicker than usual. The mucus you cough up changes from clear or white to yellow, green, gray, or is bloody. You have problems from the medicine you are taking, such as: A rash. Itching. Swelling. Trouble breathing. You need reliever medicines more than 2-3 times a week. Your peak flow reading is still at 50-79% of your personal best after following the action plan for 1 hour. You have a fever. Get help right away if: You seem to be worse and are not responding to medicine during an asthma attack. You are short of breath even at rest. You get short of breath when doing very little activity. You have trouble eating, drinking, or talking. You have chest   pain or tightness. You have a fast heartbeat. Your lips or fingernails start to turn blue. You are light-headed or dizzy, or you faint. Your peak flow is less than 50% of your personal best. You feel too tired to breathe normally. These symptoms may be an  emergency. Get help right away. Call 911. Do not wait to see if the symptoms will go away. Do not drive yourself to the hospital. Summary Asthma is a long-term (chronic) condition in which the airways get tight and narrow. An asthma attack can make it hard to breathe. Asthma cannot be cured, but medicines and lifestyle changes can help control it. Make sure you understand how to avoid triggers and how and when to use your medicines. Avoid things that can cause allergy symptoms (allergens). These include animal skin flakes (dander) and pollen from trees or grass. Avoid things that pollute the air. These may include household cleaners, wood smoke, smog, or chemical odors. This information is not intended to replace advice given to you by your health care provider. Make sure you discuss any questions you have with your health care provider. Document Revised: 05/05/2021 Document Reviewed: 05/05/2021 Elsevier Patient Education  2023 [employer]  

## 2022-05-26 NOTE — Progress Notes (Signed)
[facility] [place], [place] [address]  Pulmonary Sleep Medicine   Office Visit Note  Patient Name: Vanessa Evans DOB: 1945-10-10 MRN [id number]  Date of Service: 05/26/2022  Complaints/HPI: She is doing OK. She has lost her son and also states she had covid in August. Also has fractured vertebrae. She apparently had kyphoplasty done. Still has residual pain. She states her breathing is doing OK now she had a tough time during the covid infection. She has had her flu shot this year along with RSV vaccine.  ROS  General: (-) fever, (-) chills, (-) night sweats, (-) weakness Skin: (-) rashes, (-) itching,. Eyes: (-) visual changes, (-) redness, (-) itching. Nose and Sinuses: (-) nasal stuffiness or itchiness, (-) postnasal drip, (-) nosebleeds, (-) sinus trouble. Mouth and Throat: (-) sore throat, (-) hoarseness. Neck: (-) swollen glands, (-) enlarged thyroid, (-) neck pain. Respiratory: + cough, (-) bloody sputum, + shortness of breath, - wheezing. Cardiovascular: - ankle swelling, (-) chest pain. Lymphatic: (-) lymph node enlargement. Neurologic: (-) numbness, (-) tingling. Psychiatric: (-) anxiety, (-) depression   Current Medication: Outpatient Encounter Medications as of 05/26/2022  Medication Sig   acetaminophen (TYLENOL) 500 MG tablet Take 1,000 mg by mouth 2 (two) times daily as needed for moderate pain.   albuterol (PROVENTIL) (2.5 MG/3ML) 0.083% nebulizer solution Take 3 mLs (2.5 mg total) by nebulization every 6 (six) hours as needed for wheezing or shortness of breath.   albuterol (VENTOLIN HFA) 108 (90 Base) MCG/ACT inhaler Inhale 2 puffs into the lungs every 4 (four) hours as needed for wheezing or shortness of breath.   aspirin EC 81 MG tablet Take 81 mg by mouth at bedtime.    BLACK ELDERBERRY PO Take 1 Dose by mouth 2 (two) times a day.   Calcium Carbonate-Vitamin D 600-400 MG-UNIT tablet Take 1 tablet by mouth 2 (two) times daily.    cholecalciferol (VITAMIN D) 1000 UNITS tablet Take 1,000 Units by mouth at bedtime.    citalopram (CELEXA) 10 MG tablet Take 1 tablet (10 mg total) by mouth daily.   cyanocobalamin 500 MCG tablet Take 500 mcg by mouth daily.   Dextran 70-Hypromellose (ARTIFICIAL TEARS) 0.1-0.3 % SOLN Apply 1-2 drops to eye daily.   diclofenac Sodium (VOLTAREN) 1 % GEL Apply 4 g topically 4 (four) times daily.   diltiazem (CARDIZEM CD) 120 MG 24 hr capsule Take 120 mg by mouth at bedtime.    diphenhydrAMINE (BENADRYL) 25 MG tablet Take 25-50 mg by mouth daily as needed for itching.   docusate sodium (COLACE) 100 MG capsule Take 200 mg by mouth at bedtime as needed for moderate constipation.    EPINEPHrine (EPIPEN 2-PAK) 0.3 mg/0.3 mL IJ SOAJ injection use as directed for severe allergy reaction   fenofibrate 160 MG tablet TAKE 1 TABLET DAILY FOR CHOLESTEROL   fluticasone (FLONASE) 50 MCG/ACT nasal spray Use 2 sprays in each nostril daily,   folic acid (FOLVITE) 094 MCG tablet Take 400 mcg by mouth daily.   guaiFENesin (MUCINEX) 600 MG 12 hr tablet Take 600 mg by mouth at bedtime as needed (congestion).   Hypromellose (ARTIFICIAL TEARS OP) Place 1-2 drops into both eyes daily as needed (dry eyes).   ketotifen (ZADITOR) 0.025 % ophthalmic solution Place 1 drop into both eyes 2 (two) times daily as needed (allergies).   levocetirizine (XYZAL) 5 MG tablet Take 1 tablet (5 mg total) by mouth every evening.   levothyroxine (SYNTHROID, LEVOTHROID) 50 MCG tablet  Take 50 mcg by mouth daily before breakfast. Brand Name only   losartan (COZAAR) 25 MG tablet Take 25 mg by mouth daily.   Magnesium 250 MG TABS Take 1 tablet by mouth daily.   Melatonin 5 MG TABS Take 1 tablet by mouth at bedtime. For sleep   Multiple Vitamins-Minerals (MULTIVITAMIN WITH MINERALS) tablet Take 1 tablet by mouth daily.   OXYGEN Inhale into the lungs. 2 litre at night   pantoprazole (PROTONIX) 40 MG tablet Take 1 tablet (40 mg total) by mouth 2  (two) times daily.   pyridoxine (B-6) 100 MG tablet Take 100 mg by mouth daily.   sodium chloride (OCEAN) 0.65 % SOLN nasal spray Place 1 spray into both nostrils 2 (two) times daily.   sucralfate (CARAFATE) 1 g tablet Take 3 times daily with meals.   Vitamin A 7.5 MG (25000 UT) CAPS Take 1 tablet by mouth 2 (two) times daily.   zinc gluconate 50 MG tablet Take 50 mg by mouth daily.   No facility-administered encounter medications on file as of 05/26/2022.    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 cope.  all benign.  inflammatory process going on in bladder   BREAST BIOPSY Right    benign   BREAST SURGERY Right    lumpectomy   CATARACT EXTRACTION W/PHACO Left 12/25/2014   Procedure: CATARACT EXTRACTION PHACO AND INTRAOCULAR LENS PLACEMENT ([place]);  Surgeon: Birder Robson, MD;  Location: [facility] ORS;  Service: Ophthalmology;  Laterality: Left;  [place] 00:32 AP% 20.0 CDE 6.49   COLONOSCOPY W/ BIOPSIES     COLONOSCOPY W/ POLYPECTOMY  2002, 2004, 2005   adenomatous polyps removed   COLONOSCOPY WITH PROPOFOL N/A 08/23/2017   Procedure: COLONOSCOPY WITH PROPOFOL;  Surgeon: Manya Silvas, MD;  Location: [facility] ENDOSCOPY;  Service: Endoscopy;  Laterality: N/A;   COLONOSCOPY WITH PROPOFOL N/A 11/08/2017   Procedure: COLONOSCOPY WITH PROPOFOL;  Surgeon: Manya Silvas, MD;  Location: [facility] ENDOSCOPY;  Service: Endoscopy;  Laterality: N/A;   COLONOSCOPY WITH PROPOFOL N/A 06/30/2021   Procedure: COLONOSCOPY WITH PROPOFOL;  Surgeon: Lesly Rubenstein, MD;  Location: [facility] ENDOSCOPY;  Service: Endoscopy;  Laterality: N/A;   CYSTOCELE REPAIR N/A 04/09/2016   Procedure: ANTERIOR REPAIR (CYSTOCELE);  Surgeon: Gae Dry, MD;  Location: [facility] ORS;  Service: Gynecology;  Laterality: N/A;   DIAGNOSTIC  LAPAROSCOPY  2008   removed both ovaries with cysts, tubes and fibroids   DILATION AND CURETTAGE OF UTERUS  1973   ESOPHAGOGASTRODUODENOSCOPY (EGD) WITH PROPOFOL N/A 08/23/2017   Procedure: ESOPHAGOGASTRODUODENOSCOPY (EGD) WITH PROPOFOL;  Surgeon: Manya Silvas, MD;  Location: [facility] ENDOSCOPY;  Service: Endoscopy;  Laterality: N/A;   ESOPHAGOGASTRODUODENOSCOPY (EGD) WITH PROPOFOL N/A 06/30/2021   Procedure: ESOPHAGOGASTRODUODENOSCOPY (EGD) WITH PROPOFOL;  Surgeon: Lesly Rubenstein, MD;  Location: [facility] ENDOSCOPY;  Service: Endoscopy;  Laterality: N/A;   EYE SURGERY Bilateral 2016   HERNIA REPAIR Right 2008   Inguinal hernia Repair, ventral hernia repair   IR KYPHO THORACIC WITH BONE BIOPSY  05/12/2022   IR RADIOLOGIST EVAL & MGMT  05/19/2022   JOINT REPLACEMENT Right 2008   knee   KNEE ARTHROSCOPY Right    LUMBAR LAMINECTOMY/DECOMPRESSION MICRODISCECTOMY Right 03/21/2013   Procedure: Right Lumbar five-Sacral one Laminectomy for  Synovial Cyst;  Surgeon: Faythe Ghee, MD;  Location: [facility] ORS;  Service: Neurosurgery;  Laterality: Right;  right   OOPHORECTOMY     REVERSE SHOULDER ARTHROPLASTY Right 02/22/2018   Procedure: REVERSE SHOULDER ARTHROPLASTY;  Surgeon: Corky Mull, MD;  Location: [facility] ORS;  Service: Orthopedics;  Laterality: Right;   TUBAL LIGATION     UNILATERAL SALPINGECTOMY Left 04/09/2016   Procedure: UNILATERAL SALPINGECTOMY;  Surgeon: Gae Dry, MD;  Location: [facility] ORS;  Service: Gynecology;  Laterality: Left;   UPPER GI ENDOSCOPY  2010   with biopsy of gastric erosion   VAGINAL HYSTERECTOMY N/A 04/09/2016   Procedure: HYSTERECTOMY VAGINAL;  Surgeon: Gae Dry, MD;  Location: [facility] ORS;  Service: Gynecology;  Laterality: N/A;   VAGINAL HYSTERECTOMY  2017   and bladder tack    Medical History: Past Medical History:  Diagnosis Date   Arthritis    osteo   Asthma    needs rescue inhaler few times a year   Claustrophobia 04/21/2020   Depression     Dizziness    light headed spells but it has been a while   Dysrhythmia    tachycardia.(cardizem). brady during procedures   Fibromyalgia    GERD (gastroesophageal reflux disease)    gastritis   Headache(784.0)    Heart murmur 2019   aortic. dr. Nehemiah Massed not worried about this   Hyperlipidemia    Hypertension    Hypothyroidism    Macular degeneration    Migraine    Orthopnea    Pneumonia    long time ago (greater than 5 years ago)   PONV (postoperative nausea and vomiting)    very anxious during cataract surgery. brady during colonoscopy   Shortness of breath    any exertion, cannot lie flat    Family History: Family History  Problem Relation Age of Onset   Pulmonary fibrosis Mother    Melanoma Father    COPD Sister    Cardiomyopathy Sister        and arrhythmia   Atrial fibrillation Sister    COPD Brother    Pulmonary fibrosis Brother    Cancer Brother    Breast cancer Cousin        paternal 1st    Social History: Social History   Socioeconomic History   Marital status: Married    Spouse name: Not on file   Number of children: Not on file   Years of education: Not on file   Highest education level: Not on file  Occupational History   Not on file  Tobacco Use   Smoking status: Former    Types: Cigarettes    Quit date: 1967    Years since quitting: 56.8   Smokeless tobacco: Never  Vaping Use   Vaping Use: Never used  Substance and Sexual Activity   Alcohol use: Yes   Drug use: No   Sexual activity: Not Currently  Other Topics Concern   Not on file  Social History Narrative   Not on file   Social Determinants of Health   Financial Resource Strain: Not on file  Food Insecurity: Not on file  Transportation Needs: Not on file  Physical Activity: Not on file  Stress: Not on file  Social Connections: Not on file  Intimate Partner Violence: Not on file    Vital Signs: Blood pressure 134/71, pulse (!) 110, temperature 98.4 F (36.9 C), resp. rate  16, height $RemoveBe'5\' 1"'NAyWaayne$  (1.549 m), weight 145 lb (65.8 kg),  SpO2 97 %.  Examination: General Appearance: The patient is well-developed, well-nourished, and in no distress. Skin: Gross inspection of skin unremarkable. Head: normocephalic, no gross deformities. Eyes: no gross deformities noted. ENT: ears appear grossly normal no exudates. Neck: Supple. No thyromegaly. No LAD. Respiratory: no rhonchi noted. Cardiovascular: Normal S1 and S2 without murmur or rub. Extremities: No cyanosis. pulses are equal. Neurologic: Alert and oriented. No involuntary movements.  LABS: Recent Results (from the past 2160 hour(s))  SPECIMEN COMPROMISED     Status: None   Collection Time: 05/07/22  4:50 PM  Result Value Ref Range   Specimen Integrity Compromised      Comment: . Whole blood, unspun or partially spun gel barrier tube received more than 2 hours since collection. A false decrease in glucose may occur due to prolonged contact  with red cells. .   COMPLETE METABOLIC PANEL WITH GFR     Status: None   Collection Time: 05/07/22  4:50 PM  Result Value Ref Range   Glucose, Bld 82 65 - 139 mg/dL    Comment: .        Non-fasting reference interval .    BUN 18 7 - 25 mg/dL   Creat 0.73 0.60 - 1.00 mg/dL   eGFR 85 > OR = 60 mL/min/1.45m2   BUN/Creatinine Ratio SEE NOTE: 6 - 22 (calc)    Comment:    Not Reported: BUN and Creatinine are within    reference range. .    Sodium 138 135 - 146 mmol/L   Potassium 3.9 3.5 - 5.3 mmol/L   Chloride 100 98 - 110 mmol/L   CO2 26 20 - 32 mmol/L   Calcium 9.1 8.6 - 10.4 mg/dL   Total Protein 6.8 6.1 - 8.1 g/dL   Albumin 4.3 3.6 - 5.1 g/dL   Globulin 2.5 1.9 - 3.7 g/dL (calc)   AG Ratio 1.7 1.0 - 2.5 (calc)   Total Bilirubin 0.3 0.2 - 1.2 mg/dL   Alkaline phosphatase (APISO) 71 37 - 153 U/L   AST 20 10 - 35 U/L   ALT 20 6 - 29 U/L  CBC with Differential/Platelet     Status: Abnormal   Collection Time: 05/07/22  4:50 PM  Result Value Ref Range   WBC 7.4  3.8 - 10.8 Thousand/uL   RBC 4.36 3.80 - 5.10 Million/uL   Hemoglobin 12.5 11.7 - 15.5 g/dL   HCT 37.7 35.0 - 45.0 %   MCV 86.5 80.0 - 100.0 fL   MCH 28.7 27.0 - 33.0 pg   MCHC 33.2 32.0 - 36.0 g/dL   RDW 13.2 11.0 - 15.0 %   Platelets 507 (H) 140 - 400 Thousand/uL   MPV 9.7 7.5 - 12.5 fL   Neutro Abs 4,632 1,500 - 7,800 cells/uL   Lymphs Abs 1,510 850 - 3,900 cells/uL   Absolute Monocytes 1,021 (H) 200 - 950 cells/uL   Eosinophils Absolute 133 15 - 500 cells/uL   Basophils Absolute 104 0 - 200 cells/uL   Neutrophils Relative % 62.6 %   Total Lymphocyte 20.4 %   Monocytes Relative 13.8 %   Eosinophils Relative 1.8 %   Basophils Relative 1.4 %   Smear Review      Comment: Review of peripheral smear confirms automated results.     Radiology: IR Radiologist Eval & Mgmt  Result Date: 05/19/2022 EXAM: ESTABLISHED PATIENT OFFICE VISIT CHIEF COMPLAINT: Refer to EMR HISTORY OF PRESENT ILLNESS: I saw the patient today in brief follow-up following  her T12 vertebral body augmentation using balloon kyphoplasty on May 12, 2022. Patient does report that her sharp stabbing pain has greatly improved, now only occurs with particular exaggerated movements. She does report, however, that she has new general achiness in the lower back down to her pelvis and along her sides. The pain is somewhat constant and does continue to affect her ability to stand for prolonged periods of time and performed chores around the house. I obtained a repeat radiograph of the thoracolumbar spine, which demonstrated expected postoperative changes following for T12 kyphoplasty. No complicating features are identified. No new fractures identified. I reassured the patient that her symptoms may be due to generalized healing after the kyphoplasty procedure, and would be expected to continue to improve over the course of the next week. She does have a new appointment with neurosurgery Dr. Cari Caraway on Thursday, it may be optimal  to offer additional inside 2 other potential etiologies for her back pain. I will plan to follow-up with patient in another week. REVIEW OF SYSTEMS: Refer to EMR PHYSICAL EXAMINATION: Refer to EMR ASSESSMENT AND PLAN: Refer to EMR Electronically Signed   By: Albin Felling M.D.   On: 05/19/2022 16:43   DG THORACOLUMABAR SPINE  Result Date: 05/19/2022 CLINICAL DATA:  Pain after T12 kyphoplasty 1 week prior EXAM: THORACOLUMBAR SPINE 2 VIEW COMPARISON:  Kyphoplasty May 12, 2022 FINDINGS: Gentle right convex curvature of the thoracolumbar spine. Stable postsurgical changes of T12 kyphoplasty. Stable appearance of the cement which remains within the bounds of the T12 vertebral body cortex. No new compression fracture identified. Osteopenia. Visualized soft tissues are unremarkable. IMPRESSION: Stable postsurgical changes of T12 kyphoplasty. Osteopenia. No new acute findings. Electronically Signed   By: Albin Felling M.D.   On: 05/19/2022 15:33    No results found.  IR Radiologist Eval & Mgmt  Result Date: 05/19/2022 EXAM: ESTABLISHED PATIENT OFFICE VISIT CHIEF COMPLAINT: Refer to EMR HISTORY OF PRESENT ILLNESS: I saw the patient today in brief follow-up following her T12 vertebral body augmentation using balloon kyphoplasty on May 12, 2022. Patient does report that her sharp stabbing pain has greatly improved, now only occurs with particular exaggerated movements. She does report, however, that she has new general achiness in the lower back down to her pelvis and along her sides. The pain is somewhat constant and does continue to affect her ability to stand for prolonged periods of time and performed chores around the house. I obtained a repeat radiograph of the thoracolumbar spine, which demonstrated expected postoperative changes following for T12 kyphoplasty. No complicating features are identified. No new fractures identified. I reassured the patient that her symptoms may be due to generalized  healing after the kyphoplasty procedure, and would be expected to continue to improve over the course of the next week. She does have a new appointment with neurosurgery Dr. Cari Caraway on Thursday, it may be optimal to offer additional inside 2 other potential etiologies for her back pain. I will plan to follow-up with patient in another week. REVIEW OF SYSTEMS: Refer to EMR PHYSICAL EXAMINATION: Refer to EMR ASSESSMENT AND PLAN: Refer to EMR Electronically Signed   By: Albin Felling M.D.   On: 05/19/2022 16:43   DG THORACOLUMABAR SPINE  Result Date: 05/19/2022 CLINICAL DATA:  Pain after T12 kyphoplasty 1 week prior EXAM: THORACOLUMBAR SPINE 2 VIEW COMPARISON:  Kyphoplasty May 12, 2022 FINDINGS: Gentle right convex curvature of the thoracolumbar spine. Stable postsurgical changes of T12 kyphoplasty. Stable appearance of the cement which  remains within the bounds of the T12 vertebral body cortex. No new compression fracture identified. Osteopenia. Visualized soft tissues are unremarkable. IMPRESSION: Stable postsurgical changes of T12 kyphoplasty. Osteopenia. No new acute findings. Electronically Signed   By: Albin Felling M.D.   On: 05/19/2022 15:33   IR KYPHO THORACIC WITH BONE BIOPSY  Result Date: 05/12/2022 INDICATION: Compression fracture of T12 vertebra, initial encounter ([place]) EXAM: T12 vertebral body augmentation using balloon kyphoplasty COMPARISON:  MRI September 2023 MEDICATIONS: Per EMR ANESTHESIA/SEDATION: Moderate (conscious) sedation was employed during this procedure. A total of Versed 3.5 mg and Fentanyl 125 mcg was administered intravenously by the radiology nurse. Total intra-service moderate Sedation Time: 30 minutes. The patient's level of consciousness and vital signs were monitored continuously by radiology nursing throughout the procedure under my direct supervision. FLUOROSCOPY: Radiation Exposure Index (as provided by the fluoroscopic device): 5.1 minutes (34 mGy)  COMPLICATIONS: None immediate. PROCEDURE: Informed written consent was obtained from the patient after a thorough discussion of the procedural risks, benefits and alternatives. All questions were addressed. Maximal Sterile Barrier Technique was utilized including caps, mask, sterile gowns, sterile gloves, sterile drape, hand hygiene and skin antiseptic. A timeout was performed prior to the initiation of the procedure. The patient was positioned prone on the exam table. A unipedicular access was planned. Skin entry site was marked overlying the T12 vertebral body using fluoroscopy. The overlying skin was then prepped and draped in the standard sterile fashion. Local analgesia was obtained with 1% lidocaine. Attention was turned to the right side. Under fluoroscopic guidance, a 10 gauge introducer needle was advanced towards the lateral margin of the pedicle. Using multiple projections, the introducer needle was advanced towards the posterior margin of the vertebral body via a transpedicular approach. The inner needle was then removed, and the drill was advanced towards the anterior margin of the vertebral body. A Kyphon 15 mm inflatable bone tamp was then advanced through the transpedicular access needle and positioned within the mid vertebral body. Kyphoplasty was then performed, ensuring that the balloon contours stayed within the vertebral body margins. The balloon was then deflated and removed, followed by advancement of the bone filler device and the instillation of acrylic bone cement with excellent filling in the AP and lateral projections. No extravasation was noted in the disk spaces or posteriorly into the spinal canal. No epidural venous contamination was seen. At the end of the procedure, the introducer cannula and bone filler device were removed without difficulty. A clean dressing was placed after hemostasis. The patient tolerated all aspects of the procedure well, and was transferred to recovery in  stable condition. IMPRESSION: 1. Successful T12 vertebral body augmentation using balloon kyphoplasty for treatment of symptomatic compression fracture. If the patient has known osteoporosis, recommend treatment as clinically indicated. If the patient's bone density status is unknown, DEXA scan is recommended. Electronically Signed   By: Albin Felling M.D.   On: 05/12/2022 13:44   DG Radiologist Eval And Mgmt  Result Date: 05/07/2022 EXAM: NEW PATIENT OFFICE VISIT CHIEF COMPLAINT: See epic note. HISTORY OF PRESENT ILLNESS: See epic note. REVIEW OF SYSTEMS: See epic note. PHYSICAL EXAMINATION: See epic note. ASSESSMENT AND PLAN: See epic note. Ruthann Cancer, MD [facility] Radiology Electronically Signed   By: Ruthann Cancer M.D.   On: 05/07/2022 16:33   MR THORACIC SPINE WO CONTRAST  Result Date: 05/06/2022 CLINICAL DATA:  Mid back pain for 10 days EXAM: MRI THORACIC SPINE WITHOUT CONTRAST TECHNIQUE: Multiplanar,  multisequence MR imaging of the thoracic spine was performed. No intravenous contrast was administered. COMPARISON:  CT 11/03/2021, 04/28/2022 FINDINGS: Alignment:  Exaggerated thoracic kyphosis.  No static listhesis. Vertebrae: Subacute superior endplate compression fracture of the T12 vertebral body with up to 40% vertebral body height loss. 4 mm bony retropulsion at the superior endplate. There is mild residual bone marrow edema at the fracture site. No evidence of fracture extension into the posterior elements. No additional fractures. There are a few scattered T1/T2 hyperintense intraosseous hemangiomas, largest of which is located within the T8 vertebral body. No suspicious marrow replacing bone lesion. No evidence of discitis. Cord:  Normal signal and morphology. Paraspinal and other soft tissues: Negative. Disc levels: Intervertebral disc heights of the thoracic spine are preserved. Facet joints within normal limits. Retropulsion at the T12  endplate results in slight impress upon the thecal sac at this level without canal stenosis. No canal or foraminal stenosis is seen at any level. IMPRESSION: Subacute superior endplate compression fracture of the T12 vertebral body with up to 40% vertebral body height loss and 4 mm bony retropulsion. No associated canal stenosis or cord injury. Electronically Signed   By: Davina Poke D.O.   On: 05/06/2022 13:49   CT Lumbar Spine Wo Contrast  Result Date: 04/28/2022 CLINICAL DATA:  Chronic low back pain. EXAM: CT LUMBAR SPINE WITHOUT CONTRAST TECHNIQUE: Multidetector CT imaging of the lumbar spine was performed without intravenous contrast administration. Multiplanar CT image reconstructions were also generated. RADIATION DOSE REDUCTION: This exam was performed according to the departmental dose-optimization program which includes automated exposure control, adjustment of the mA and/or kV according to patient size and/or use of iterative reconstruction technique. COMPARISON:  CT scan 11/03/2021 FINDINGS: Segmentation: There are five lumbar type vertebral bodies. The last full intervertebral disc space is labeled L5-S1. Alignment: Stable right-sided pars defect at L5 with a grade 1 spondylolisthesis. The other lumbar vertebral bodies are normally aligned in the sagittal plane. Stable left convex lumbar scoliosis. Vertebrae: There is a subacute/healing superior endplate fracture of X91. No other acute fractures are identified. Moderate osteoporosis. There is a stable sclerotic lesion involving the right L4 pedicle. Stable small sclerotic lesion in S2 also. Paraspinal and other soft tissues: No significant paraspinal or retroperitoneal findings. Stable advanced aortic calcifications without aneurysm. Disc levels: T12-L1: No significant findings. L1-2: No significant findings. L2-3: Mild annular bulge and moderate facet disease but no significant spinal or foraminal stenosis. Mild lateral recess encroachment  bilaterally. L3-4: Advanced degenerative disc disease and facet disease. Bulging degenerated annulus, osteophytic ridging and ligamentum flavum thickening contributing to moderate spinal and bilateral lateral recess stenosis. No significant foraminal stenosis. L4-5: Bulging annulus, advanced facet disease and ligamentum flavum thickening contributing to mild spinal and bilateral lateral recess stenosis. No foraminal stenosis. L5-S1: Bulging uncovered disc and severe facet disease but no significant spinal or foraminal stenosis. Wide decompressive laminectomy is noted. IMPRESSION: 1. Subacute/healing superior endplate fracture of Y78. 2. Stable right-sided pars defect at L5 with a grade 1 spondylolisthesis. 3. Moderate spinal and bilateral lateral recess stenosis at L3-4. 4. Mild spinal and bilateral lateral recess stenosis at L4-5. 5. Stable sclerotic lesions involving the right L4 pedicle and S2. 6. Aortic atherosclerosis. Aortic Atherosclerosis (ICD10-I70.0). Electronically Signed   By: Marijo Sanes M.D.   On: 04/28/2022 15:16      Assessment and Plan: Patient Active Problem List   Diagnosis Date Noted   Lymphedema 01/18/2022   Coronary artery disease involving native coronary artery of  native heart with angina pectoris ([place]) 10/28/2020   Peptic ulcer disease 04/21/2020   Claustrophobia 04/21/2020   Urinary tract infection without hematuria 02/04/2020   Pain in both lower extremities 12/18/2019   Inflammatory polyarthritis ([place]) 07/26/2019   Excessive daytime sleepiness 07/26/2019   Encounter for screening mammogram for malignant neoplasm of breast 07/26/2019   Intercostal muscle pain 06/25/2019   Positive ANA (antinuclear antibody) 05/01/2019   Primary osteoarthritis involving multiple joints 04/26/2019   Mild intermittent asthma without complication 53/61/4431   Seasonal allergic rhinitis due to pollen 08/22/2018   Chills with fever 08/11/2018   Sore throat 08/11/2018   Acute upper  respiratory infection 08/11/2018   Candidiasis 08/11/2018   Iron deficiency anemia 05/05/2018   Right hip pain 04/27/2018   Accidental fall from furniture 04/27/2018   Vitamin D deficiency 04/27/2018   Cough 04/18/2018   Dysuria 04/18/2018   Status post reverse total shoulder replacement, right 02/22/2018   Abnormal ECG 02/18/2018   Pain in joint of right shoulder 02/13/2018   Abdominal aortic atherosclerosis ([place]) 02/13/2018   Episode of recurrent major depressive disorder ([place]) 02/13/2018   Encounter for general adult medical examination with abnormal findings 02/13/2018   Lipoma of right shoulder 01/31/2018   Rotator cuff tendinitis, right 01/31/2018   Chronic superficial gastritis without bleeding 12/12/2017   Schatzki's ring 12/12/2017   Acute gastritis 11/08/2017   Nausea 10/18/2017   Other fatigue 10/18/2017   Allergic rhinitis 09/20/2017   Eczema 09/20/2017   Hematuria 09/20/2017   Mixed hyperlipidemia 09/20/2017   Osteoarthritis 09/20/2017   Osteoporosis, post-menopausal 09/20/2017   Palpitations 07/05/2017   Epigastric pain 05/24/2017   Gastroesophageal reflux disease without esophagitis 05/24/2017   Impingement syndrome of left shoulder 10/02/2016   Trochanteric bursitis of left hip 10/02/2016   SOB (shortness of breath) 04/16/2016   Uterine prolapse 04/09/2016   Cystocele 04/09/2016   Hyponatremia 04/09/2016   Benign essential HTN 11/18/2015   Paroxysmal supraventricular tachycardia 11/18/2015   Premature ventricular contraction 07/20/2014   History of colonic polyps 05/29/2014   Asthma 02/05/2014   Chest pain 02/05/2014   Increased frequency of urination 02/05/2014   Tachycardia 02/05/2014   Female stress incontinence 12/14/2013   Gross hematuria 12/14/2013   Incomplete emptying of bladder 12/14/2013   Other chronic cystitis without hematuria 12/14/2013    1. SOB (shortness of breath) She is doing relatively well appears to be at baseline we will  continue with current recommended therapy - Spirometry with graph  2. Chronic asthma, mild intermittent, uncomplicated No exacerbations have been noted we will continue with supportive care  3. [place] she is doing relatively well in terms of recovering  4. Nocturnal oxygen desaturation Oxygen therapy at nighttime  General Counseling: I have discussed the findings of the evaluation and examination with Michaiah.  I have also discussed any further diagnostic evaluation thatmay be needed or ordered today. Jorgina verbalizes understanding of the findings of todays visit. We also reviewed her medications today and discussed drug interactions and side effects including but not limited excessive drowsiness and altered mental states. We also discussed that there is always a risk not just to her but also people around her. she has been encouraged to call the office with any questions or concerns that should arise related to todays visit.  Orders Placed This Encounter  Procedures   Spirometry with graph    Order Specific Question:   Where should this test be performed?    Answer:   [facility]     Time spent: 31  I have personally obtained a history, examined the patient, evaluated laboratory and imaging results, formulated the assessment and plan and placed orders.    Allyne Gee, MD [facility] Pulmonary and Critical Care Sleep medicine

## 2022-05-26 NOTE — Progress Notes (Signed)
IR brief note   I followed up briefly with the patient who underwent T12 kyphoplasty on May 12, 2022.  She is now approximately 2 weeks out.  She continues to report the same severe dull achy pain in her lower back that prevents her from standing for prolonged periods of time.  She does report continued relief from the previous stabbing pain that was present prior to the procedure.  Since my last phone conversation with the patient, she is had a follow-up with Neurosurgery Dr. Izora Ribas.  He has ordered a brace for the patient, and is planning for 6 week follow-up with possible further intervention as appropriate.  I informed the patient that she had a good technical result with the kyphoplasty, and that her fracture may take a longer than average time to heal.  I recommended continued follow-up with Dr. Izora Ribas, and she may continue to follow-up with me as needed.    Albin Felling, MD  Vascular and Interventional Radiology 05/26/2022 4:57 PM

## 2022-06-08 ENCOUNTER — Encounter (INDEPENDENT_AMBULATORY_CARE_PROVIDER_SITE_OTHER): Payer: Self-pay

## 2022-06-08 DIAGNOSIS — M5136 Other intervertebral disc degeneration, lumbar region: Secondary | ICD-10-CM | POA: Diagnosis not present

## 2022-06-08 DIAGNOSIS — S22000A Wedge compression fracture of unspecified thoracic vertebra, initial encounter for closed fracture: Secondary | ICD-10-CM | POA: Diagnosis not present

## 2022-06-08 DIAGNOSIS — M48062 Spinal stenosis, lumbar region with neurogenic claudication: Secondary | ICD-10-CM | POA: Diagnosis not present

## 2022-06-08 DIAGNOSIS — M5416 Radiculopathy, lumbar region: Secondary | ICD-10-CM | POA: Diagnosis not present

## 2022-06-15 DIAGNOSIS — M5416 Radiculopathy, lumbar region: Secondary | ICD-10-CM | POA: Diagnosis not present

## 2022-06-15 DIAGNOSIS — M48062 Spinal stenosis, lumbar region with neurogenic claudication: Secondary | ICD-10-CM | POA: Diagnosis not present

## 2022-06-18 ENCOUNTER — Ambulatory Visit (INDEPENDENT_AMBULATORY_CARE_PROVIDER_SITE_OTHER): Payer: Medicare Other | Admitting: Physician Assistant

## 2022-06-18 ENCOUNTER — Encounter: Payer: Self-pay | Admitting: Physician Assistant

## 2022-06-18 VITALS — BP 140/72 | HR 105 | Temp 98.4°F | Resp 16 | Ht 61.0 in | Wt 146.8 lb

## 2022-06-18 DIAGNOSIS — Z9189 Other specified personal risk factors, not elsewhere classified: Secondary | ICD-10-CM

## 2022-06-18 DIAGNOSIS — I10 Essential (primary) hypertension: Secondary | ICD-10-CM | POA: Diagnosis not present

## 2022-06-18 DIAGNOSIS — F331 Major depressive disorder, recurrent, moderate: Secondary | ICD-10-CM | POA: Diagnosis not present

## 2022-06-18 DIAGNOSIS — I6523 Occlusion and stenosis of bilateral carotid arteries: Secondary | ICD-10-CM | POA: Diagnosis not present

## 2022-06-18 MED ORDER — ROSUVASTATIN CALCIUM 5 MG PO TABS: 5.0000 mg | ORAL_TABLET | Freq: Every day | ORAL | 3 refills | Status: DC

## 2022-06-18 NOTE — Progress Notes (Signed)
[facility] [place], [place] [address]  Internal MEDICINE  Office Visit Note  Patient Name: Vanessa Evans  [id number]  [id number]  Date of Service: 06/23/2022  Chief Complaint  Patient presents with   Follow-up   Depression   Gastroesophageal Reflux   Hypertension   Hyperlipidemia    HPI Pt is here for routine follow up -Unfortunately her son died suddenly I his sleep likely due to heart attack after autopsy found atherosclerosis. She has been managing ok. -She is therefore Interested in lowering CV risk further. Will start statin and wil go ahead and update carotid US -She reports is took awhile for her to recover from covid and she does state if she were to get covid again then would like mulnipravir  treatment as her husband did well on this. -She went to ED in Sept due to worsening chronic low back pain and was found to have a fracture. Thought to be due to bone density despite last bone density not showing osteoporosis. Endo started her back on medication for this, Evista -Followed by PMR, had procedure done but still having pain in back. Did get injections on Monday to help as well. She is wearing a back brace now. -Did see ENT for popping in ear and was started on azelastine -HR elevated due to pain, but has been elevated previously and could consider BB, however her HR drops at night and would need to be careful with this. Would discuss with her cardiologist if indicated  The 10-year ASCVD risk score (Arnett DK, et al., 2019) is: 20%   Values used to calculate the score:     Age: 41 years     Sex: Female     Is Non-Hispanic African American: No     Diabetic: No     Tobacco smoker: No     Systolic Blood Pressure: 093 mmHg     Is BP treated: Yes     HDL Cholesterol: 48 mg/dL     Total Cholesterol: 166 mg/dL  Current Medication: Outpatient Encounter Medications as of 06/18/2022  Medication Sig   acetaminophen (TYLENOL) 500 MG tablet Take  1,000 mg by mouth 2 (two) times daily as needed for moderate pain.   albuterol (PROVENTIL) (2.5 MG/3ML) 0.083% nebulizer solution Take 3 mLs (2.5 mg total) by nebulization every 6 (six) hours as needed for wheezing or shortness of breath.   albuterol (VENTOLIN HFA) 108 (90 Base) MCG/ACT inhaler Inhale 2 puffs into the lungs every 4 (four) hours as needed for wheezing or shortness of breath.   aspirin EC 81 MG tablet Take 81 mg by mouth at bedtime.    BLACK ELDERBERRY PO Take 1 Dose by mouth 2 (two) times a day.   Calcium Carbonate-Vitamin D 600-400 MG-UNIT tablet Take 1 tablet by mouth 2 (two) times daily.   cholecalciferol (VITAMIN D) 1000 UNITS tablet Take 1,000 Units by mouth at bedtime.    citalopram (CELEXA) 10 MG tablet Take 1 tablet (10 mg total) by mouth daily.   cyanocobalamin 500 MCG tablet Take 500 mcg by mouth daily.   Dextran 70-Hypromellose (ARTIFICIAL TEARS) 0.1-0.3 % SOLN Apply 1-2 drops to eye daily.   diclofenac Sodium (VOLTAREN) 1 % GEL Apply 4 g topically 4 (four) times daily.   diltiazem (CARDIZEM CD) 120 MG 24 hr capsule Take 120 mg by mouth at bedtime.    diphenhydrAMINE (BENADRYL) 25 MG tablet Take 25-50 mg by mouth daily as needed for itching.  docusate sodium (COLACE) 100 MG capsule Take 200 mg by mouth at bedtime as needed for moderate constipation.    EPINEPHrine (EPIPEN 2-PAK) 0.3 mg/0.3 mL IJ SOAJ injection use as directed for severe allergy reaction   fenofibrate 160 MG tablet TAKE 1 TABLET DAILY FOR CHOLESTEROL   fluticasone (FLONASE) 50 MCG/ACT nasal spray Use 2 sprays in each nostril daily,   folic acid (FOLVITE) 850 MCG tablet Take 400 mcg by mouth daily.   guaiFENesin (MUCINEX) 600 MG 12 hr tablet Take 600 mg by mouth at bedtime as needed (congestion).   Hypromellose (ARTIFICIAL TEARS OP) Place 1-2 drops into both eyes daily as needed (dry eyes).   ketotifen (ZADITOR) 0.025 % ophthalmic solution Place 1 drop into both eyes 2 (two) times daily as needed  (allergies).   levocetirizine (XYZAL) 5 MG tablet Take 1 tablet (5 mg total) by mouth every evening.   levothyroxine (SYNTHROID, LEVOTHROID) 50 MCG tablet Take 50 mcg by mouth daily before breakfast. Brand Name only   losartan (COZAAR) 25 MG tablet Take 25 mg by mouth daily.   Magnesium 250 MG TABS Take 1 tablet by mouth daily.   Melatonin 5 MG TABS Take 1 tablet by mouth at bedtime. For sleep   Multiple Vitamins-Minerals (MULTIVITAMIN WITH MINERALS) tablet Take 1 tablet by mouth daily.   OXYGEN Inhale into the lungs. 2 litre at night   pantoprazole (PROTONIX) 40 MG tablet Take 1 tablet (40 mg total) by mouth 2 (two) times daily.   pyridoxine (B-6) 100 MG tablet Take 100 mg by mouth daily.   raloxifene (EVISTA) 60 MG tablet Take 60 mg by mouth daily.   rosuvastatin (CRESTOR) 5 MG tablet Take 1 tablet (5 mg total) by mouth daily.   sodium chloride (OCEAN) 0.65 % SOLN nasal spray Place 1 spray into both nostrils 2 (two) times daily.   sucralfate (CARAFATE) 1 g tablet Take 3 times daily with meals.   Vitamin A 7.5 MG (25000 UT) CAPS Take 1 tablet by mouth 2 (two) times daily.   zinc gluconate 50 MG tablet Take 50 mg by mouth daily.   No facility-administered encounter medications on file as of 06/18/2022.    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 cope.  all benign.  inflammatory process going on in bladder   BREAST BIOPSY Right    benign   BREAST SURGERY Right    lumpectomy   CATARACT EXTRACTION W/PHACO Left 12/25/2014   Procedure: CATARACT EXTRACTION PHACO AND INTRAOCULAR LENS PLACEMENT ([place]);  Surgeon: Birder Robson, MD;  Location: [facility] ORS;  Service: Ophthalmology;  Laterality: Left;  [place] 00:32 AP% 20.0 CDE 6.49   COLONOSCOPY W/ BIOPSIES     COLONOSCOPY W/ POLYPECTOMY  2002, 2004, 2005    adenomatous polyps removed   COLONOSCOPY WITH PROPOFOL N/A 08/23/2017   Procedure: COLONOSCOPY WITH PROPOFOL;  Surgeon: Manya Silvas, MD;  Location[facility] END[facility];  Service: Endoscopy;  Laterality: N/A;   COLONOSCOPY WITH PROPOFOL N/A 11/08/2017   Procedure: COLONOSCOPY WITH PROPOFOL;  Surgeon: Manya Silvas, MD;  Location: [facility] ENDOSCOPY;  Service: Endoscopy;  Laterality: N/A;   COLONOSCOPY WITH PROPOFOL N/A 06/30/2021   Procedure: COLONOSCOPY WITH PROPOFOL;  Surgeon: Lesly Rubenstein, MD;  Location: [facility] ENDOSCOPY;  Service:  Endoscopy;  Laterality: N/A;   CYSTOCELE REPAIR N/A 04/09/2016   Procedure: ANTERIOR REPAIR (CYSTOCELE);  Surgeon: Gae Dry, MD;  Location: [facility] ORS;  Service: Gynecology;  Laterality: N/A;   DIAGNOSTIC LAPAROSCOPY  2008   removed both ovaries with cysts, tubes and fibroids   DILATION AND CURETTAGE OF UTERUS  1973   ESOPHAGOGASTRODUODENOSCOPY (EGD) WITH PROPOFOL N/A 08/23/2017   Procedure: ESOPHAGOGASTRODUODENOSCOPY (EGD) WITH PROPOFOL;  Surgeon: Manya Silvas, MD;  Location: [facility] ENDOSCOPY;  Service: Endoscopy;  Laterality: N/A;   ESOPHAGOGASTRODUODENOSCOPY (EGD) WITH PROPOFOL N/A 06/30/2021   Procedure: ESOPHAGOGASTRODUODENOSCOPY (EGD) WITH PROPOFOL;  Surgeon: Lesly Rubenstein, MD;  Location: [facility] ENDOSCOPY;  Service: Endoscopy;  Laterality: N/A;   EYE SURGERY Bilateral 2016   HERNIA REPAIR Right 2008   Inguinal hernia Repair, ventral hernia repair   IR KYPHO THORACIC WITH BONE BIOPSY  05/12/2022   IR RADIOLOGIST EVAL & MGMT  05/19/2022   IR RADIOLOGIST EVAL & MGMT  05/26/2022   JOINT REPLACEMENT Right 2008   knee   KNEE ARTHROSCOPY Right    LUMBAR LAMINECTOMY/DECOMPRESSION MICRODISCECTOMY Right 03/21/2013   Procedure: Right Lumbar five-Sacral one Laminectomy for Synovial Cyst;  Surgeon: Faythe Ghee, MD;  Location: [place] [facility] ORS;  Service: Neurosurgery;  Laterality: Right;  right   OOPHORECTOMY     REVERSE SHOULDER ARTHROPLASTY Right  02/22/2018   Procedure: REVERSE SHOULDER ARTHROPLASTY;  Surgeon: Corky Mull, MD;  Location: [facility] ORS;  Service: Orthopedics;  Laterality: Right;   TUBAL LIGATION     UNILATERAL SALPINGECTOMY Left 04/09/2016   Procedure: UNILATERAL SALPINGECTOMY;  Surgeon: Gae Dry, MD;  Location: [facility] ORS;  Service: Gynecology;  Laterality: Left;   UPPER GI ENDOSCOPY  2010   with biopsy of gastric erosion   VAGINAL HYSTERECTOMY N/A 04/09/2016   Procedure: HYSTERECTOMY VAGINAL;  Surgeon: Gae Dry, MD;  Location: [facility] ORS;  Service: Gynecology;  Laterality: N/A;   VAGINAL HYSTERECTOMY  2017   and bladder tack    Medical History: Past Medical History:  Diagnosis Date   Arthritis    osteo   Asthma    needs rescue inhaler few times a year   Claustrophobia 04/21/2020   Depression    Dizziness    light headed spells but it has been a while   Dysrhythmia    tachycardia.(cardizem). brady during procedures   Fibromyalgia    GERD (gastroesophageal reflux disease)    gastritis   Headache(784.0)    Heart murmur 2019   aortic. dr. Nehemiah Massed not worried about this   Hyperlipidemia    Hypertension    Hypothyroidism    Macular degeneration    Migraine    Orthopnea    Pneumonia    long time ago (greater than 5 years ago)   PONV (postoperative nausea and vomiting)    very anxious during cataract surgery. brady during colonoscopy   Shortness of breath    any exertion, cannot lie flat    Family History: Family History  Problem Relation Age of Onset   Pulmonary fibrosis Mother    Melanoma Father    COPD Sister    Cardiomyopathy Sister        and arrhythmia   Atrial fibrillation Sister    COPD Brother    Pulmonary fibrosis Brother    Cancer Brother    Breast cancer Cousin        paternal 1st    Social History   Socioeconomic History   Marital status: Married  Spouse name: Not on file   Number of children: Not on file   Years of education: Not on file   Highest education  level: Not on file  Occupational History   Not on file  Tobacco Use   Smoking status: Former    Types: Cigarettes    Quit date: 65    Years since quitting: 56.9   Smokeless tobacco: Never  Vaping Use   Vaping Use: Never used  Substance and Sexual Activity   Alcohol use: Yes   Drug use: No   Sexual activity: Not Currently  Other Topics Concern   Not on file  Social History Narrative   Not on file   Social Determinants of Health   Financial Resource Strain: Not on file  Food Insecurity: Not on file  Transportation Needs: Not on file  Physical Activity: Not on file  Stress: Not on file  Social Connections: Not on file  Intimate Partner Violence: Not on file      Review of Systems  Constitutional:  Negative for chills, fatigue and unexpected weight change.  HENT:  Negative for congestion, rhinorrhea, sneezing and sore throat.   Eyes:  Negative for redness.  Respiratory:  Negative for cough, chest tightness and shortness of breath.   Cardiovascular:  Negative for chest pain and palpitations.  Gastrointestinal:  Negative for abdominal pain, constipation, diarrhea, nausea and vomiting.  Genitourinary:  Negative for dysuria and frequency.  Musculoskeletal:  Positive for back pain. Negative for arthralgias, joint swelling and neck pain.  Skin:  Negative for rash.  Neurological: Negative.  Negative for tremors and numbness.  Hematological:  Negative for adenopathy. Does not bruise/bleed easily.  Psychiatric/Behavioral:  Positive for dysphoric mood. Negative for sleep disturbance and suicidal ideas. Behavioral problem: Depression.The patient is nervous/anxious.     Vital Signs: BP (!) 140/72   Pulse (!) 105   Temp 98.4 F (36.9 C)   Resp 16   Ht '5\' 1"'$  (1.549 m)   Wt 146 lb 12.8 oz (66.6 kg)   SpO2 98%   BMI 27.74 kg/m    Physical Exam Vitals and nursing note reviewed.  Constitutional:      General: She is not in acute distress.    Appearance: Normal appearance.  She is well-developed. She is not diaphoretic.  HENT:     Head: Normocephalic and atraumatic.     Right Ear: Tympanic membrane normal.     Left Ear: Tympanic membrane normal.     Mouth/Throat:     Pharynx: No oropharyngeal exudate.  Eyes:     Pupils: Pupils are equal, round, and reactive to light.  Neck:     Thyroid: No thyromegaly.     Vascular: No JVD.     Trachea: No tracheal deviation.  Cardiovascular:     Rate and Rhythm: Normal rate and regular rhythm.     Heart sounds: Normal heart sounds. No murmur heard.    No friction rub. No gallop.  Pulmonary:     Effort: Pulmonary effort is normal. No respiratory distress.     Breath sounds: No wheezing or rales.  Chest:     Chest wall: No tenderness.  Abdominal:     General: Bowel sounds are normal.     Palpations: Abdomen is soft.  Musculoskeletal:     Cervical back: Normal range of motion and neck supple.     Comments: Wearing back brace  Lymphadenopathy:     Cervical: No cervical adenopathy.  Skin:    General:  Skin is warm and dry.  Neurological:     Mental Status: She is alert and oriented to person, place, and time.     Cranial Nerves: No cranial nerve deficit.  Psychiatric:        Behavior: Behavior normal.        Thought Content: Thought content normal.        Judgment: Judgment normal.        Assessment/Plan: 1. Benign essential HTN Borderline BP and elevated HR in office due to back pain and will continue current medications and monitoring. Also followed by cardiology  2. Candidate for statin therapy due to risk of future cardiovascular event Will start on crestor nightly to help lower CV risk - rosuvastatin (CRESTOR) 5 MG tablet; Take 1 tablet (5 mg total) by mouth daily.  Dispense: 30 tablet; Refill: 3  3. Atherosclerosis of both carotid arteries - US Carotid Duplex Bilateral; Future  4. Moderate episode of recurrent major depressive disorder ([place]) Continue celexa   General Counseling: Catalyna  verbalizes understanding of the findings of todays visit and agrees with plan of treatment. I have discussed any further diagnostic evaluation that may be needed or ordered today. We also reviewed her medications today. she has been encouraged to call the office with any questions or concerns that should arise related to todays visit.    Orders Placed This Encounter  Procedures   US Carotid Duplex Bilateral    Meds ordered this encounter  Medications   rosuvastatin (CRESTOR) 5 MG tablet    Sig: Take 1 tablet (5 mg total) by mouth daily.    Dispense:  30 tablet    Refill:  3    This patient was seen by Drema Dallas, PA-C in collaboration with Dr. Clayborn Bigness as a part of collaborative care agreement.   Total time spent:30 Minutes Time spent includes review of chart, medications, test results, and follow up plan with the patient.      Dr Lavera Guise Internal medicine

## 2022-06-22 DIAGNOSIS — H43823 Vitreomacular adhesion, bilateral: Secondary | ICD-10-CM | POA: Diagnosis not present

## 2022-06-22 DIAGNOSIS — Z961 Presence of intraocular lens: Secondary | ICD-10-CM | POA: Diagnosis not present

## 2022-06-22 DIAGNOSIS — H353211 Exudative age-related macular degeneration, right eye, with active choroidal neovascularization: Secondary | ICD-10-CM | POA: Diagnosis not present

## 2022-06-23 DIAGNOSIS — M159 Polyosteoarthritis, unspecified: Secondary | ICD-10-CM | POA: Diagnosis not present

## 2022-06-23 DIAGNOSIS — R768 Other specified abnormal immunological findings in serum: Secondary | ICD-10-CM | POA: Diagnosis not present

## 2022-06-23 DIAGNOSIS — M5136 Other intervertebral disc degeneration, lumbar region: Secondary | ICD-10-CM | POA: Diagnosis not present

## 2022-06-24 ENCOUNTER — Ambulatory Visit
Admission: RE | Admit: 2022-06-24 | Discharge: 2022-06-24 | Disposition: A | Discharge status: Home / Self Care | Payer: Medicare Other | Source: Ambulatory Visit | Attending: Physician Assistant | Admitting: Physician Assistant

## 2022-06-24 DIAGNOSIS — I6523 Occlusion and stenosis of bilateral carotid arteries: Secondary | ICD-10-CM | POA: Diagnosis not present

## 2022-06-26 ENCOUNTER — Telehealth: Payer: Self-pay

## 2022-06-26 NOTE — Telephone Encounter (Signed)
-----   Message from Mylinda Latina, PA-C sent at 06/26/2022  1:48 PM EST ----- Please let her know that her carotid US looked good without significant plaque or stenosis

## 2022-06-26 NOTE — Telephone Encounter (Signed)
Spoke with patient regarding carotid U/S results.

## 2022-06-29 ENCOUNTER — Other Ambulatory Visit: Payer: Self-pay | Admitting: Physician Assistant

## 2022-06-29 DIAGNOSIS — F331 Major depressive disorder, recurrent, moderate: Secondary | ICD-10-CM

## 2022-06-30 DIAGNOSIS — R1013 Epigastric pain: Secondary | ICD-10-CM | POA: Diagnosis not present

## 2022-06-30 DIAGNOSIS — K5909 Other constipation: Secondary | ICD-10-CM | POA: Diagnosis not present

## 2022-06-30 DIAGNOSIS — K219 Gastro-esophageal reflux disease without esophagitis: Secondary | ICD-10-CM | POA: Diagnosis not present

## 2022-07-06 ENCOUNTER — Other Ambulatory Visit: Payer: Self-pay

## 2022-07-06 DIAGNOSIS — S22080D Wedge compression fracture of T11-T12 vertebra, subsequent encounter for fracture with routine healing: Secondary | ICD-10-CM

## 2022-07-07 ENCOUNTER — Ambulatory Visit
Admission: RE | Admit: 2022-07-07 | Discharge: 2022-07-07 | Disposition: A | Discharge status: Home / Self Care | Payer: Medicare Other | Attending: Neurosurgery | Admitting: Neurosurgery

## 2022-07-07 ENCOUNTER — Ambulatory Visit
Admission: RE | Admit: 2022-07-07 | Discharge: 2022-07-07 | Disposition: A | Discharge status: Home / Self Care | Payer: Medicare Other | Source: Ambulatory Visit | Attending: Neurosurgery | Admitting: Neurosurgery

## 2022-07-07 ENCOUNTER — Ambulatory Visit (INDEPENDENT_AMBULATORY_CARE_PROVIDER_SITE_OTHER): Payer: Medicare Other | Admitting: Neurosurgery

## 2022-07-07 ENCOUNTER — Encounter: Payer: Self-pay | Admitting: Neurosurgery

## 2022-07-07 VITALS — BP 134/70 | Ht 61.0 in | Wt 146.0 lb

## 2022-07-07 DIAGNOSIS — Z8781 Personal history of (healed) traumatic fracture: Secondary | ICD-10-CM | POA: Diagnosis not present

## 2022-07-07 DIAGNOSIS — M8588 Other specified disorders of bone density and structure, other site: Secondary | ICD-10-CM | POA: Diagnosis not present

## 2022-07-07 DIAGNOSIS — S22080D Wedge compression fracture of T11-T12 vertebra, subsequent encounter for fracture with routine healing: Secondary | ICD-10-CM | POA: Insufficient documentation

## 2022-07-07 DIAGNOSIS — M4184 Other forms of scoliosis, thoracic region: Secondary | ICD-10-CM | POA: Diagnosis not present

## 2022-07-07 DIAGNOSIS — J9811 Atelectasis: Secondary | ICD-10-CM | POA: Diagnosis not present

## 2022-07-07 NOTE — Progress Notes (Signed)
Referring Physician:  Mylinda Latina, PA-C [address] [place],  [place] [address]  Primary Physician:  Mylinda Latina, PA-C  History of Present Illness: 07/07/2022 She is doing a little bit better.  Her pain kicks in when she stands for more than 10 minutes.  05/21/2022 Vanessa Evans is here today with a chief complaint of mid and low back pain that doesn't radiate into the legs.  Having pains in September 19.  She had a kyphoplasty on October 3.  She continues to have severe pain with ambulation.  Standing, chores, walking make it worse.  Medication has helped but she is in a life altering amount of pain.   Bowel/Bladder Dysfunction: none  Conservative measures:  Physical therapy:  has not participated in recently for this flare.  Multimodal medical therapy including regular antiinflammatories: hydrocodone, methocarbamol, tylenol, medrol dosepak Injections:  has had epidural steroid injections in the past 03/11/2021: Right L5-S1 transforaminal ESI (70% relief) 02/21/2021: Left trochanteric bursa injection  01/14/2021: Right L5-S1 and right L4-5 transforaminal ESI (mild relief of lower extremity pain in the lower extremity) 05/10/2020: MBB to the bilateral L4-5 and L5-S1 facet joints (9/10 to 2/10, patient states that she had continued moderate to severe pain while at home with activity) 04/22/2020: MBB to the bilateral L4-5 and L5-S1 facet joints (7/10 to 2/10) 10/03/2018: Left trochanteric bursa injection (moderate relief) 05/10/2018: Left trochanteric bursa injection performed by Marylee Floras (good relief)   Past Surgery: T12 Kyphoplasty on 05/12/2022 by Dr. Denna Haggard Lumbar Laminectomy in 2014 by Dr. Timmie Foerster has no symptoms of cervical myelopathy.  The symptoms are causing a significant impact on the patient's life.   Review of Systems:  A 10 point review of systems is negative, except for the pertinent positives and negatives detailed in  the HPI.  Past Medical History: Past Medical History:  Diagnosis Date   Arthritis    osteo   Asthma    needs rescue inhaler few times a year   Claustrophobia 04/21/2020   Depression    Dizziness    light headed spells but it has been a while   Dysrhythmia    tachycardia.(cardizem). brady during procedures   Fibromyalgia    GERD (gastroesophageal reflux disease)    gastritis   Headache(784.0)    Heart murmur 2019   aortic. dr. Nehemiah Massed not worried about this   Hyperlipidemia    Hypertension    Hypothyroidism    Macular degeneration    Migraine    Orthopnea    Pneumonia    long time ago (greater than 5 years ago)   PONV (postoperative nausea and vomiting)    very anxious during cataract surgery. 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 cope.  all benign.  inflammatory process going on in bladder   BREAST BIOPSY Right    benign   BREAST SURGERY Right    lumpectomy   CATARACT EXTRACTION W/PHACO Left 12/25/2014   Procedure: CATARACT EXTRACTION PHACO AND INTRAOCULAR LENS PLACEMENT ([place]);  Surgeon: Birder Robson, MD;  Location: [facility] ORS;  Service: Ophthalmology;  Laterality: Left;  [place] 00:32  AP% 20.0 CDE 6.49   COLONOSCOPY W/ BIOPSIES     COLONOSCOPY W/ POLYPECTOMY  2002, 2004, 2005   adenomatous polyps removed   COLONOSCOPY WITH PROPOFOL N/A 08/23/2017   Procedure: COLONOSCOPY WITH PROPOFOL;  Surgeon: Manya Silvas, MD;  Location: [facility] ENDOSCOPY;  Service: Endoscopy;  Laterality: N/A;   COLONOSCOPY WITH PROPOFOL N/A 11/08/2017   Procedure: COLONOSCOPY WITH PROPOFOL;  Surgeon: Manya Silvas, MD;  Location: [facility] ENDOSCOPY;  Service: Endoscopy;  Laterality: N/A;   COLONOSCOPY WITH  PROPOFOL N/A 06/30/2021   Procedure: COLONOSCOPY WITH PROPOFOL;  Surgeon: Lesly Rubenstein, MD;  Location: [facility] ENDOSCOPY;  Service: Endoscopy;  Laterality: N/A;   CYSTOCELE REPAIR N/A 04/09/2016   Procedure: ANTERIOR REPAIR (CYSTOCELE);  Surgeon: Gae Dry, MD;  Location: [facility] ORS;  Service: Gynecology;  Laterality: N/A;   DIAGNOSTIC LAPAROSCOPY  2008   removed both ovaries with cysts, tubes and fibroids   DILATION AND CURETTAGE OF UTERUS  1973   ESOPHAGOGASTRODUODENOSCOPY (EGD) WITH PROPOFOL N/A 08/23/2017   Procedure: ESOPHAGOGASTRODUODENOSCOPY (EGD) WITH PROPOFOL;  Surgeon: Manya Silvas, MD;  Location: [facility] ENDOSCOPY;  Service: Endoscopy;  Laterality: N/A;   ESOPHAGOGASTRODUODENOSCOPY (EGD) WITH PROPOFOL N/A 06/30/2021   Procedure: ESOPHAGOGASTRODUODENOSCOPY (EGD) WITH PROPOFOL;  Surgeon: Lesly Rubenstein, MD;  Location: [facility] ENDOSCOPY;  Service: Endoscopy;  Laterality: N/A;   EYE SURGERY Bilateral 2016   HERNIA REPAIR Right 2008   Inguinal hernia Repair, ventral hernia repair   IR KYPHO THORACIC WITH BONE BIOPSY  05/12/2022   IR RADIOLOGIST EVAL & MGMT  05/19/2022   IR RADIOLOGIST EVAL & MGMT  05/26/2022   JOINT REPLACEMENT Right 2008   knee   KNEE ARTHROSCOPY Right    LUMBAR LAMINECTOMY/DECOMPRESSION MICRODISCECTOMY Right 03/21/2013   Procedure: Right Lumbar five-Sacral one Laminectomy for Synovial Cyst;  Surgeon: Faythe Ghee, MD;  Location: [place] NEURO ORS;  Service: Neurosurgery;  Laterality: Right;  right   OOPHORECTOMY     REVERSE SHOULDER ARTHROPLASTY Right 02/22/2018   Procedure: REVERSE SHOULDER ARTHROPLASTY;  Surgeon: Corky Mull, MD;  Location: [facility] ORS;  Service: Orthopedics;  Laterality: Right;   TUBAL LIGATION     UNILATERAL SALPINGECTOMY Left 04/09/2016   Procedure: UNILATERAL SALPINGECTOMY;  Surgeon: Gae Dry, MD;  Location: [facility] ORS;  Service: Gynecology;  Laterality: Left;   UPPER GI ENDOSCOPY  2010   with biopsy of gastric erosion   VAGINAL  HYSTERECTOMY N/A 04/09/2016   Procedure: HYSTERECTOMY VAGINAL;  Surgeon: Gae Dry, MD;  Location: [facility] ORS;  Service: Gynecology;  Laterality: N/A;   VAGINAL HYSTERECTOMY  2017   and bladder tack    Allergies: Allergies as of 07/07/2022 - Review Complete 07/07/2022  Allergen Reaction Noted   Codeine Shortness Of Breath and Itching 03/07/2013   Levofloxacin  11/08/2013   Oxycodone Itching and Shortness Of Breath 02/24/2016   Ultram [tramadol] Itching and Other (See Comments) 03/07/2013   Nitrofurantoin Diarrhea 03/07/2013   Nsaids Other (See Comments) 03/07/2013   Sulfacetamide Rash 08/20/2017   Adhesive [tape] Other (See Comments) 02/11/2018   Azithromycin  02/05/2014   Ciprofloxacin Other (See Comments) 03/07/2013   Ketorolac Itching 12/14/2013   Sulfa antibiotics Itching, Rash, and Other (See Comments) 03/07/2013   Tolmetin  05/29/2014   Zofran [ondansetron hcl] Other (See Comments) 02/11/2018    Medications: Current Meds  Medication Sig   acetaminophen (TYLENOL) 500 MG tablet Take 1,000 mg by mouth 2 (two) times daily as needed for moderate pain.   albuterol (PROVENTIL) (2.5 MG/3ML)  0.083% nebulizer solution Take 3 mLs (2.5 mg total) by nebulization every 6 (six) hours as needed for wheezing or shortness of breath.   albuterol (VENTOLIN HFA) 108 (90 Base) MCG/ACT inhaler Inhale 2 puffs into the lungs every 4 (four) hours as needed for wheezing or shortness of breath.   aspirin EC 81 MG tablet Take 81 mg by mouth at bedtime.    BLACK ELDERBERRY PO Take 1 Dose by mouth 2 (two) times a day.   Calcium Carbonate-Vitamin D 600-400 MG-UNIT tablet Take 1 tablet by mouth 2 (two) times daily.   cholecalciferol (VITAMIN D) 1000 UNITS tablet Take 1,000 Units by mouth at bedtime.    citalopram (CELEXA) 10 MG tablet Take 1 tablet (10 mg total) by mouth daily.   cyanocobalamin 500 MCG tablet Take 500 mcg by mouth daily.   Dextran 70-Hypromellose (ARTIFICIAL TEARS) 0.1-0.3 % SOLN Apply  1-2 drops to eye daily.   diclofenac Sodium (VOLTAREN) 1 % GEL Apply 4 g topically 4 (four) times daily.   diltiazem (CARDIZEM CD) 120 MG 24 hr capsule Take 120 mg by mouth at bedtime.    diphenhydrAMINE (BENADRYL) 25 MG tablet Take 25-50 mg by mouth daily as needed for itching.   docusate sodium (COLACE) 100 MG capsule Take 200 mg by mouth at bedtime as needed for moderate constipation.    EPINEPHrine (EPIPEN 2-PAK) 0.3 mg/0.3 mL IJ SOAJ injection use as directed for severe allergy reaction   fenofibrate 160 MG tablet TAKE 1 TABLET DAILY FOR CHOLESTEROL   fluticasone (FLONASE) 50 MCG/ACT nasal spray Use 2 sprays in each nostril daily,   folic acid (FOLVITE) 591 MCG tablet Take 400 mcg by mouth daily.   guaiFENesin (MUCINEX) 600 MG 12 hr tablet Take 600 mg by mouth at bedtime as needed (congestion).   Hypromellose (ARTIFICIAL TEARS OP) Place 1-2 drops into both eyes daily as needed (dry eyes).   ketotifen (ZADITOR) 0.025 % ophthalmic solution Place 1 drop into both eyes 2 (two) times daily as needed (allergies).   levocetirizine (XYZAL) 5 MG tablet Take 1 tablet (5 mg total) by mouth every evening.   levothyroxine (SYNTHROID, LEVOTHROID) 50 MCG tablet Take 50 mcg by mouth daily before breakfast. Brand Name only   losartan (COZAAR) 25 MG tablet Take 25 mg by mouth daily.   Magnesium 250 MG TABS Take 1 tablet by mouth daily.   Melatonin 5 MG TABS Take 1 tablet by mouth at bedtime. For sleep   Multiple Vitamins-Minerals (MULTIVITAMIN WITH MINERALS) tablet Take 1 tablet by mouth daily.   OXYGEN Inhale into the lungs. 2 litre at night   pantoprazole (PROTONIX) 40 MG tablet Take 1 tablet (40 mg total) by mouth 2 (two) times daily.   pyridoxine (B-6) 100 MG tablet Take 100 mg by mouth daily.   raloxifene (EVISTA) 60 MG tablet Take 60 mg by mouth daily.   rosuvastatin (CRESTOR) 5 MG tablet Take 1 tablet (5 mg total) by mouth daily.   sodium chloride (OCEAN) 0.65 % SOLN nasal spray Place 1 spray into  both nostrils 2 (two) times daily.   sucralfate (CARAFATE) 1 g tablet Take 3 times daily with meals.   Vitamin A 7.5 MG (25000 UT) CAPS Take 1 tablet by mouth 2 (two) times daily.   zinc gluconate 50 MG tablet Take 50 mg by mouth daily.    Social History: Social History   Tobacco Use   Smoking status: Former    Types: Cigarettes    Quit date: 1967  Years since quitting: 56.9   Smokeless tobacco: Never  Vaping Use   Vaping Use: Never used  Substance Use Topics   Alcohol use: Yes   Drug use: No    Family Medical History: Family History  Problem Relation Age of Onset   Pulmonary fibrosis Mother    Melanoma Father    COPD Sister    Cardiomyopathy Sister        and arrhythmia   Atrial fibrillation Sister    COPD Brother    Pulmonary fibrosis Brother    Cancer Brother    Breast cancer Cousin        paternal 1st    Physical Examination: Vitals:   07/07/22 1148  BP: 134/70    General: Patient is well developed, well nourished, calm, collected, and in no apparent distress. Attention to examination is appropriate.  Neck:   Supple.  Full range of motion.  Respiratory: Patient is breathing without any difficulty.   NEUROLOGICAL:     Awake, alert, oriented to person, place, and time.  Speech is clear and fluent. Fund of knowledge is appropriate.   Cranial Nerves: Pupils equal round and reactive to light.  Facial tone is symmetric.  Facial sensation is symmetric. Shoulder shrug is symmetric. Tongue protrusion is midline.  There is no pronator drift.  ROM of spine: full.    Strength: Side Biceps Triceps Deltoid Interossei Grip Wrist Ext. Wrist Flex.  R '5 5 5 5 5 5 5  '$ L '5 5 5 5 5 5 5   '$ Side Iliopsoas Quads Hamstring PF DF EHL  R '5 5 5 5 5 5  '$ L '5 5 5 5 5 5   '$ Reflexes are 1+ and symmetric at the biceps, triceps, brachioradialis, patella and achilles.   Hoffman's is absent.   Bilateral upper and lower extremity sensation is intact to light touch.    No evidence  of dysmetria noted.  Gait is normal.     Medical Decision Making  Imaging: Xrays 05/19/22 T12 kyphoplasty with some coronal plane deformity (mild)  I have personally reviewed the images and agree with the above interpretation.  Assessment and Plan: Vanessa Evans is a pleasant 76 y.o. female with T12 compression fracture status post kyphoplasty.  She has slightly improved compared to the last time I saw her.  Her imaging is stable.  We will see her back in 6 weeks with x-rays.  She will continue her brace for now.    I spent a total of 10 minutes in face-to-face and non-face-to-face activities related to this patient's care today.  Thank you for involving me in the care of this patient.      Akira Adelsberger K. Izora Ribas MD, [facility] Neurosurgery

## 2022-07-10 DIAGNOSIS — Z23 Encounter for immunization: Secondary | ICD-10-CM | POA: Diagnosis not present

## 2022-07-13 DIAGNOSIS — M7542 Impingement syndrome of left shoulder: Secondary | ICD-10-CM | POA: Diagnosis not present

## 2022-07-13 DIAGNOSIS — M47816 Spondylosis without myelopathy or radiculopathy, lumbar region: Secondary | ICD-10-CM | POA: Diagnosis not present

## 2022-07-13 DIAGNOSIS — M19012 Primary osteoarthritis, left shoulder: Secondary | ICD-10-CM | POA: Diagnosis not present

## 2022-07-13 DIAGNOSIS — M5030 Other cervical disc degeneration, unspecified cervical region: Secondary | ICD-10-CM | POA: Diagnosis not present

## 2022-07-13 DIAGNOSIS — M47812 Spondylosis without myelopathy or radiculopathy, cervical region: Secondary | ICD-10-CM | POA: Diagnosis not present

## 2022-07-14 ENCOUNTER — Other Ambulatory Visit: Payer: Self-pay | Admitting: Internal Medicine

## 2022-07-14 DIAGNOSIS — I493 Ventricular premature depolarization: Secondary | ICD-10-CM | POA: Diagnosis not present

## 2022-07-14 DIAGNOSIS — I471 Supraventricular tachycardia, unspecified: Secondary | ICD-10-CM | POA: Diagnosis not present

## 2022-07-14 DIAGNOSIS — I2510 Atherosclerotic heart disease of native coronary artery without angina pectoris: Secondary | ICD-10-CM | POA: Diagnosis not present

## 2022-07-14 DIAGNOSIS — R0602 Shortness of breath: Secondary | ICD-10-CM | POA: Diagnosis not present

## 2022-07-14 DIAGNOSIS — I700 Atherosclerosis of aorta: Secondary | ICD-10-CM | POA: Diagnosis not present

## 2022-07-14 DIAGNOSIS — R600 Localized edema: Secondary | ICD-10-CM | POA: Diagnosis not present

## 2022-07-14 DIAGNOSIS — R9431 Abnormal electrocardiogram [ECG] [EKG]: Secondary | ICD-10-CM | POA: Diagnosis not present

## 2022-07-14 DIAGNOSIS — I10 Essential (primary) hypertension: Secondary | ICD-10-CM | POA: Diagnosis not present

## 2022-07-14 DIAGNOSIS — I25119 Atherosclerotic heart disease of native coronary artery with unspecified angina pectoris: Secondary | ICD-10-CM | POA: Diagnosis not present

## 2022-07-14 DIAGNOSIS — R001 Bradycardia, unspecified: Secondary | ICD-10-CM | POA: Diagnosis not present

## 2022-07-14 DIAGNOSIS — R079 Chest pain, unspecified: Secondary | ICD-10-CM | POA: Diagnosis not present

## 2022-07-14 DIAGNOSIS — R002 Palpitations: Secondary | ICD-10-CM | POA: Diagnosis not present

## 2022-07-23 ENCOUNTER — Telehealth (HOSPITAL_COMMUNITY): Payer: Self-pay | Admitting: Emergency Medicine

## 2022-07-23 ENCOUNTER — Encounter (HOSPITAL_COMMUNITY): Payer: Self-pay | Admitting: Emergency Medicine

## 2022-07-23 DIAGNOSIS — R079 Chest pain, unspecified: Secondary | ICD-10-CM

## 2022-07-23 MED ORDER — IVABRADINE HCL 5 MG PO TABS: 15.0000 mg | ORAL_TABLET | Freq: Once | ORAL | 0 refills | Status: AC

## 2022-07-23 MED ORDER — METOPROLOL TARTRATE 100 MG PO TABS: 100.0000 mg | ORAL_TABLET | Freq: Once | ORAL | 0 refills | Status: DC

## 2022-07-23 NOTE — Telephone Encounter (Signed)
Reaching out to patient to offer assistance regarding upcoming cardiac imaging study; pt verbalizes understanding of appt date/time, parking situation and where to check in, pre-test NPO status and medications ordered, and verified current allergies; name and call back number provided for further questions should they arise Vanessa Bond RN Navigator Cardiac [facility] [place] [facility] [phone] office [phone] cell  Pt requesting to move appt to [facility] for accessibilty New appt made on 08/20/22 '100mg'$  metoprolol + '15mg'$  ivabradine sent to pharm on file. Will mail instruction letter to home Vanessa Evans

## 2022-07-27 ENCOUNTER — Ambulatory Visit: Payer: Medicare Other

## 2022-07-28 DIAGNOSIS — G8929 Other chronic pain: Secondary | ICD-10-CM | POA: Diagnosis not present

## 2022-07-28 DIAGNOSIS — M7582 Other shoulder lesions, left shoulder: Secondary | ICD-10-CM | POA: Diagnosis not present

## 2022-07-28 DIAGNOSIS — M67912 Unspecified disorder of synovium and tendon, left shoulder: Secondary | ICD-10-CM | POA: Diagnosis not present

## 2022-07-28 DIAGNOSIS — M7542 Impingement syndrome of left shoulder: Secondary | ICD-10-CM | POA: Diagnosis not present

## 2022-07-28 DIAGNOSIS — M19012 Primary osteoarthritis, left shoulder: Secondary | ICD-10-CM | POA: Diagnosis not present

## 2022-07-30 ENCOUNTER — Other Ambulatory Visit: Payer: Self-pay | Admitting: Physician Assistant

## 2022-07-30 ENCOUNTER — Other Ambulatory Visit: Payer: Self-pay | Admitting: Internal Medicine

## 2022-07-30 DIAGNOSIS — K219 Gastro-esophageal reflux disease without esophagitis: Secondary | ICD-10-CM

## 2022-07-30 DIAGNOSIS — J301 Allergic rhinitis due to pollen: Secondary | ICD-10-CM

## 2022-08-11 ENCOUNTER — Encounter: Payer: Self-pay | Admitting: Oncology

## 2022-08-12 ENCOUNTER — Other Ambulatory Visit: Payer: Self-pay | Admitting: Physician Assistant

## 2022-08-12 DIAGNOSIS — M47816 Spondylosis without myelopathy or radiculopathy, lumbar region: Secondary | ICD-10-CM | POA: Diagnosis not present

## 2022-08-12 DIAGNOSIS — E782 Mixed hyperlipidemia: Secondary | ICD-10-CM

## 2022-08-13 ENCOUNTER — Encounter: Payer: Self-pay | Admitting: Oncology

## 2022-08-13 ENCOUNTER — Ambulatory Visit (INDEPENDENT_AMBULATORY_CARE_PROVIDER_SITE_OTHER): Payer: Medicare Other | Admitting: Podiatry

## 2022-08-13 ENCOUNTER — Encounter: Payer: Self-pay | Admitting: Podiatry

## 2022-08-13 VITALS — BP 130/72

## 2022-08-13 DIAGNOSIS — M76821 Posterior tibial tendinitis, right leg: Secondary | ICD-10-CM | POA: Diagnosis not present

## 2022-08-13 NOTE — Progress Notes (Signed)
Subjective:  Patient ID: Vanessa Evans, female    DOB: 12-04-1945,  MRN: [id number]  Chief Complaint  Patient presents with   Foot Pain    77 y.o. female presents with the above complaint.  Patient presents with right medial foot pain.  Patient states it came out of nowhere has been hurting her with ambulation hurts with pressure she went to get it treated.  She is try to stop in the boot that she has at home but has not helped as much.  But she will work for a little while.  Denies any other acute complaints.  Would like to discuss treatment options for this.  She would like to also do an injection if needed.   Review of Systems: Negative except as noted in the HPI. Denies N/V/F/Ch.  Past Medical History:  Diagnosis Date   Arthritis    osteo   Asthma    needs rescue inhaler few times a year   Claustrophobia 04/21/2020   Depression    Dizziness    light headed spells but it has been a while   Dysrhythmia    tachycardia.(cardizem). brady during procedures   Fibromyalgia    GERD (gastroesophageal reflux disease)    gastritis   Headache(784.0)    Heart murmur 2019   aortic. dr. Nehemiah Massed not worried about this   Hyperlipidemia    Hypertension    Hypothyroidism    Macular degeneration    Migraine    Orthopnea    Pneumonia    long time ago (greater than 5 years ago)   PONV (postoperative nausea and vomiting)    very anxious during cataract surgery. brady during colonoscopy   Shortness of breath    any exertion, cannot lie flat    Current Outpatient Medications:    acetaminophen (TYLENOL) 500 MG tablet, Take 1,000 mg by mouth 2 (two) times daily as needed for moderate pain., Disp: , Rfl:    albuterol (PROVENTIL) (2.5 MG/3ML) 0.083% nebulizer solution, Take 3 mLs (2.5 mg total) by nebulization every 6 (six) hours as needed for wheezing or shortness of breath., Disp: 75 mL, Rfl: 3   albuterol (VENTOLIN HFA) 108 (90 Base) MCG/ACT inhaler, Inhale 2 puffs into the lungs every  4 (four) hours as needed for wheezing or shortness of breath., Disp: 18 g, Rfl: 5   aspirin EC 81 MG tablet, Take 81 mg by mouth at bedtime. , Disp: , Rfl:    BLACK ELDERBERRY PO, Take 1 Dose by mouth 2 (two) times a day., Disp: , Rfl:    Calcium Carbonate-Vitamin D 600-400 MG-UNIT tablet, Take 1 tablet by mouth 2 (two) times daily., Disp: , Rfl:    cholecalciferol (VITAMIN D) 1000 UNITS tablet, Take 1,000 Units by mouth at bedtime. , Disp: , Rfl:    citalopram (CELEXA) 10 MG tablet, Take 1 tablet (10 mg total) by mouth daily., Disp: 90 tablet, Rfl: 0   cyanocobalamin 500 MCG tablet, Take 500 mcg by mouth daily., Disp: , Rfl:    Dextran 70-Hypromellose (ARTIFICIAL TEARS) 0.1-0.3 % SOLN, Apply 1-2 drops to eye daily., Disp: , Rfl:    diclofenac Sodium (VOLTAREN) 1 % GEL, Apply 4 g topically 4 (four) times daily., Disp: 500 g, Rfl: 3   diltiazem (CARDIZEM CD) 120 MG 24 hr capsule, Take 120 mg by mouth at bedtime. , Disp: , Rfl: 3   diphenhydrAMINE (BENADRYL) 25 MG tablet, Take 25-50 mg by mouth daily as needed for itching., Disp: , Rfl:  docusate sodium (COLACE) 100 MG capsule, Take 200 mg by mouth at bedtime as needed for moderate constipation. , Disp: , Rfl:    EPINEPHrine (EPIPEN 2-PAK) 0.3 mg/0.3 mL IJ SOAJ injection, use as directed for severe allergy reaction, Disp: 2 each, Rfl: 1   fenofibrate 160 MG tablet, TAKE 1 TABLET DAILY FOR CHOLESTEROL, Disp: 90 tablet, Rfl: 0   fluticasone (FLONASE) 50 MCG/ACT nasal spray, Use 2 sprays in each nostril daily,, Disp: 16 g, Rfl: 2   folic acid (FOLVITE) 440 MCG tablet, Take 400 mcg by mouth daily., Disp: , Rfl:    guaiFENesin (MUCINEX) 600 MG 12 hr tablet, Take 600 mg by mouth at bedtime as needed (congestion)., Disp: , Rfl:    Hypromellose (ARTIFICIAL TEARS OP), Place 1-2 drops into both eyes daily as needed (dry eyes)., Disp: , Rfl:    ketotifen (ZADITOR) 0.025 % ophthalmic solution, Place 1 drop into both eyes 2 (two) times daily as needed  (allergies)., Disp: , Rfl:    levocetirizine (XYZAL) 5 MG tablet, Take 1 tablet (5 mg total) by mouth every evening., Disp: 90 tablet, Rfl: 0   levothyroxine (SYNTHROID, LEVOTHROID) 50 MCG tablet, Take 50 mcg by mouth daily before breakfast. Brand Name only, Disp: , Rfl:    losartan (COZAAR) 25 MG tablet, Take 25 mg by mouth daily., Disp: , Rfl:    Magnesium 250 MG TABS, Take 1 tablet by mouth daily., Disp: , Rfl:    Melatonin 5 MG TABS, Take 1 tablet by mouth at bedtime. For sleep, Disp: , Rfl:    metoprolol tartrate (LOPRESSOR) 100 MG tablet, Take 1 tablet (100 mg total) by mouth once for 1 dose. Please take one time dose '100mg'$  metoprolol tartrate 2 hr prior to cardiac CT for HR control IF HR >55bpm., Disp: 1 tablet, Rfl: 0   Multiple Vitamins-Minerals (MULTIVITAMIN WITH MINERALS) tablet, Take 1 tablet by mouth daily., Disp: , Rfl:    OXYGEN, Inhale into the lungs. 2 litre at night, Disp: , Rfl:    pantoprazole (PROTONIX) 40 MG tablet, Take 1 tablet (40 mg total) by mouth 2 (two) times daily., Disp: 180 tablet, Rfl: 0   pyridoxine (B-6) 100 MG tablet, Take 100 mg by mouth daily., Disp: , Rfl:    raloxifene (EVISTA) 60 MG tablet, Take 60 mg by mouth daily., Disp: , Rfl:    rosuvastatin (CRESTOR) 5 MG tablet, Take 1 tablet (5 mg total) by mouth daily., Disp: 30 tablet, Rfl: 3   sodium chloride (OCEAN) 0.65 % SOLN nasal spray, Place 1 spray into both nostrils 2 (two) times daily., Disp: , Rfl:    sucralfate (CARAFATE) 1 g tablet, Take 3 times daily with meals., Disp: 90 tablet, Rfl: 2   Vitamin A 7.5 MG (25000 UT) CAPS, Take 1 tablet by mouth 2 (two) times daily., Disp: , Rfl:    zinc gluconate 50 MG tablet, Take 50 mg by mouth daily., Disp: , Rfl:   Social History   Tobacco Use  Smoking Status Former   Types: Cigarettes   Quit date: 1967   Years since quitting: 57.0  Smokeless Tobacco Never    Allergies  Allergen Reactions   Codeine Shortness Of Breath and Itching    Bronchospasms and  asthma attach   Levofloxacin     Altered mental status": drunk" feeling, confused, speech problems Other reaction(s): Unknown    Oxycodone Itching and Shortness Of Breath    Would take benadryl to eliminate itching. Has had bronchospasm  Ultram [Tramadol] Itching and Other (See Comments)    Bronchospasm and asthma attack   Nitrofurantoin Diarrhea    Tried again and had no problems with it Severe and long lasting   Nsaids Other (See Comments)    Bloody stools, abdominal pain, ulcer History of gastritis   Sulfacetamide Rash    Fever, abdominal pain   Adhesive [Tape] Other (See Comments)    Thin skin. Causes tears. Paper tape okay   Azithromycin     Unsure. Patient does not remember but thinks that it did not work for her.   Ciprofloxacin Other (See Comments)    Severe pain in neck and down back    Ketorolac Itching   Sulfa Antibiotics Itching, Rash and Other (See Comments)    fever   Tolmetin     Other reaction(s): Other (See Comments) Bloody stools, abdominal pain History of chronic gastritis   Zofran [Ondansetron Hcl] Other (See Comments)    constipation   Objective:   Vitals:   08/13/22 1519  BP: 130/72   There is no height or weight on file to calculate BMI. Constitutional Well developed. Well nourished.  Vascular Dorsalis pedis pulses palpable bilaterally. Posterior tibial pulses palpable bilaterally. Capillary refill normal to all digits.  No cyanosis or clubbing noted. Pedal hair growth normal.  Neurologic Normal speech. Oriented to person, place, and time. Epicritic sensation to light touch grossly present bilaterally.  Dermatologic Nails well groomed and normal in appearance. No open wounds. No skin lesions.  Orthopedic: Pain on palpation along the course of the posterior tibial tendon no pain at the insertion.  Pain with resisted plantarflexion inversion of the foot no pain with dorsiflexion eversion of the foot no pain at the Achilles tendon peroneal  tendon.   Radiographs: None Assessment:   1. Posterior tibial tendinitis of right leg    Plan:  Patient was evaluated and treated and all questions answered.  Right posterior tibial tendinitis -All questions and concerns were discussed with the patient in extensive detail given her flatfoot deformity is likely aggravating the posterior tibial tendon.  She would benefit from steroid injection help decrease confirmatory component associate with pain.  I discussed the risk of rupture with that.  She states understand like to proceed with injection -A steroid injection was performed at right medial foot at point of maximal tenderness using 1% plain Lidocaine and 10 mg of Kenalog. This was well tolerated. -I will dispense a new cam boot as she has lost previous ones  No follow-ups on file.

## 2022-08-14 ENCOUNTER — Telehealth: Payer: Self-pay | Admitting: *Deleted

## 2022-08-14 NOTE — Telephone Encounter (Signed)
Patient received a boot from the [place] location , is too large, would like to exchange it out on Monday for a smaller sz.

## 2022-08-17 ENCOUNTER — Other Ambulatory Visit: Payer: Self-pay

## 2022-08-17 ENCOUNTER — Ambulatory Visit
Admission: RE | Admit: 2022-08-17 | Discharge: 2022-08-17 | Disposition: A | Discharge status: Home / Self Care | Payer: Medicare Other | Source: Ambulatory Visit | Attending: Neurosurgery | Admitting: Neurosurgery

## 2022-08-17 ENCOUNTER — Ambulatory Visit
Admission: RE | Admit: 2022-08-17 | Discharge: 2022-08-17 | Disposition: A | Discharge status: Home / Self Care | Payer: Medicare Other | Attending: Neurosurgery | Admitting: Neurosurgery

## 2022-08-17 ENCOUNTER — Encounter: Payer: Self-pay | Admitting: Oncology

## 2022-08-17 DIAGNOSIS — M419 Scoliosis, unspecified: Secondary | ICD-10-CM | POA: Diagnosis not present

## 2022-08-17 DIAGNOSIS — S22080D Wedge compression fracture of T11-T12 vertebra, subsequent encounter for fracture with routine healing: Secondary | ICD-10-CM

## 2022-08-17 DIAGNOSIS — D225 Melanocytic nevi of trunk: Secondary | ICD-10-CM | POA: Diagnosis not present

## 2022-08-17 DIAGNOSIS — L538 Other specified erythematous conditions: Secondary | ICD-10-CM | POA: Diagnosis not present

## 2022-08-17 DIAGNOSIS — D2261 Melanocytic nevi of right upper limb, including shoulder: Secondary | ICD-10-CM | POA: Diagnosis not present

## 2022-08-17 DIAGNOSIS — L570 Actinic keratosis: Secondary | ICD-10-CM | POA: Diagnosis not present

## 2022-08-17 DIAGNOSIS — D2271 Melanocytic nevi of right lower limb, including hip: Secondary | ICD-10-CM | POA: Diagnosis not present

## 2022-08-17 DIAGNOSIS — S22080A Wedge compression fracture of T11-T12 vertebra, initial encounter for closed fracture: Secondary | ICD-10-CM | POA: Diagnosis not present

## 2022-08-17 DIAGNOSIS — D2262 Melanocytic nevi of left upper limb, including shoulder: Secondary | ICD-10-CM | POA: Diagnosis not present

## 2022-08-17 DIAGNOSIS — L821 Other seborrheic keratosis: Secondary | ICD-10-CM | POA: Diagnosis not present

## 2022-08-17 DIAGNOSIS — L820 Inflamed seborrheic keratosis: Secondary | ICD-10-CM | POA: Diagnosis not present

## 2022-08-17 DIAGNOSIS — D2272 Melanocytic nevi of left lower limb, including hip: Secondary | ICD-10-CM | POA: Diagnosis not present

## 2022-08-18 ENCOUNTER — Ambulatory Visit (INDEPENDENT_AMBULATORY_CARE_PROVIDER_SITE_OTHER): Payer: Medicare Other | Admitting: Neurosurgery

## 2022-08-18 ENCOUNTER — Ambulatory Visit: Payer: Medicare Other | Admitting: Neurosurgery

## 2022-08-18 ENCOUNTER — Telehealth (HOSPITAL_COMMUNITY): Payer: Self-pay | Admitting: Emergency Medicine

## 2022-08-18 DIAGNOSIS — S22080D Wedge compression fracture of T11-T12 vertebra, subsequent encounter for fracture with routine healing: Secondary | ICD-10-CM | POA: Diagnosis not present

## 2022-08-18 NOTE — Progress Notes (Signed)
Referring Physician:  Mylinda Latina, PA-C [address] [place],  [place] [address]  Primary Physician:  Mylinda Latina, PA-C  History of Present Illness: 08/18/2022 Her pain continues to improve though she does have some discomfort with activity.  She is undergoing evaluation for radiofrequency ablation in her lower back.  07/07/2022 She is doing a little bit better.  Her pain kicks in when she stands for more than 10 minutes.  05/21/2022 [name] is here today with a chief complaint of mid and low back pain that doesn't radiate into the legs.  Having pains in September 19.  She had a kyphoplasty on October 3.  She continues to have severe pain with ambulation.  Standing, chores, walking make it worse.  Medication has helped but she is in a life altering amount of pain.   Bowel/Bladder Dysfunction: none  Conservative measures:  Physical therapy:  has not participated in recently for this flare.  Multimodal medical therapy including regular antiinflammatories: hydrocodone, methocarbamol, tylenol, medrol dosepak Injections:  has had epidural steroid injections in the past 03/11/2021: Right L5-S1 transforaminal ESI (70% relief) 02/21/2021: Left trochanteric bursa injection  01/14/2021: Right L5-S1 and right L4-5 transforaminal ESI (mild relief of lower extremity pain in the lower extremity) 05/10/2020: MBB to the bilateral L4-5 and L5-S1 facet joints (9/10 to 2/10, patient states that she had continued moderate to severe pain while at home with activity) 04/22/2020: MBB to the bilateral L4-5 and L5-S1 facet joints (7/10 to 2/10) 10/03/2018: Left trochanteric bursa injection (moderate relief) 05/10/2018: Left trochanteric bursa injection performed by Marylee Floras (good relief)   Past Surgery: T12 Kyphoplasty on 05/12/2022 by Dr. Denna Haggard Lumbar Laminectomy in 2014 by Dr. Timmie Foerster has no symptoms of cervical myelopathy.  The symptoms are causing a  significant impact on the patient's life.   Review of Systems:  A 10 point review of systems is negative, except for the pertinent positives and negatives detailed in the HPI.  Past Medical History: Past Medical History:  Diagnosis Date   Arthritis    osteo   Asthma    needs rescue inhaler few times a year   Claustrophobia 04/21/2020   Depression    Dizziness    light headed spells but it has been a while   Dysrhythmia    tachycardia.(cardizem). brady during procedures   Fibromyalgia    GERD (gastroesophageal reflux disease)    gastritis   Headache(784.0)    Heart murmur 2019   aortic. dr. Nehemiah Massed not worried about this   Hyperlipidemia    Hypertension    Hypothyroidism    Macular degeneration    Migraine    Orthopnea    Pneumonia    long time ago (greater than 5 years ago)   PONV (postoperative nausea and vomiting)    very anxious during cataract surgery. 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 cope.  all benign.  inflammatory process going on in bladder   BREAST BIOPSY Right    benign   BREAST SURGERY Right    lumpectomy   CATARACT EXTRACTION [facility] Left 12/25/2014  Procedure: CATARACT EXTRACTION PHACO AND INTRAOCULAR LENS PLACEMENT (IOC);  Surgeon: Birder Robson, MD;  Location: [facility] ORS;  Service: Ophthalmology;  Laterality: Left;  [place] 00:32 AP% 20.0 CDE 6.49   COLONOSCOPY W/ BIOPSIES     COLONOSCOPY W/ POLYPECTOMY  2002, 2004, 2005   adenomatous polyps removed   COLONOSCOPY WITH PROPOFOL N/A 08/23/2017   Procedure: COLONOSCOPY WITH PROPOFOL;  Surgeon: Manya Silvas, MD;  Location: [facility] ENDOSCOPY;  Service: Endoscopy;  Laterality: N/A;   COLONOSCOPY WITH PROPOFOL N/A  11/08/2017   Procedure: COLONOSCOPY WITH PROPOFOL;  Surgeon: Manya Silvas, MD;  Location: [facility] ENDOSCOPY;  Service: Endoscopy;  Laterality: N/A;   COLONOSCOPY WITH PROPOFOL N/A 06/30/2021   Procedure: COLONOSCOPY WITH PROPOFOL;  Surgeon: Lesly Rubenstein, MD;  Location: [facility] ENDOSCOPY;  Service: Endoscopy;  Laterality: N/A;   CYSTOCELE REPAIR N/A 04/09/2016   Procedure: ANTERIOR REPAIR (CYSTOCELE);  Surgeon: Gae Dry, MD;  Location: [facility] ORS;  Service: Gynecology;  Laterality: N/A;   DIAGNOSTIC LAPAROSCOPY  2008   removed both ovaries with cysts, tubes and fibroids   DILATION AND CURETTAGE OF UTERUS  1973   ESOPHAGOGASTRODUODENOSCOPY (EGD) WITH PROPOFOL N/A 08/23/2017   Procedure: ESOPHAGOGASTRODUODENOSCOPY (EGD) WITH PROPOFOL;  Surgeon: Manya Silvas, MD;  Location: [facility]OSCOPY;  Service: Endoscopy;  Laterality: N/A;   ESOPHAGOGASTRODUODENOSCOPY (EGD) WITH PROPOFOL N/A 06/30/2021   Procedure: ESOPHAGOGASTRODUODENOSCOPY (EGD) WITH PROPOFOL;  Surgeon: Lesly Rubenstein, MD;  Location: [facility] ENDOSCOPY;  Service: Endoscopy;  Laterality: N/A;   EYE SURGERY Bilateral 2016   HERNIA REPAIR Right 2008   Inguinal hernia Repair, ventral hernia repair   IR KYPHO THORACIC WITH BONE BIOPSY  05/12/2022   IR RADIOLOGIST EVAL & MGMT  05/19/2022   IR RADIOLOGIST EVAL & MGMT  05/26/2022   JOINT REPLACEMENT Right 2008   knee   KNEE ARTHROSCOPY Right    LUMBAR LAMINECTOMY/DECOMPRESSION MICRODISCECTOMY Right 03/21/2013   Procedure: Right Lumbar five-Sacral one Laminectomy for Synovial Cyst;  Surgeon: Faythe Ghee, MD;  Location: [facility];  Service: Neurosurgery;  Laterality: Right;  right   OOPHORECTOMY     REVERSE SHOULDER ARTHROPLASTY Right 02/22/2018   Procedure: REVERSE SHOULDER ARTHROPLASTY;  Surgeon: Corky Mull, MD;  Location: [facility] ORS;  Service: Orthopedics;  Laterality: Right;   TUBAL LIGATION     UNILATERAL SALPINGECTOMY Left 04/09/2016   Procedure: UNILATERAL SALPINGECTOMY;   Surgeon: Gae Dry, MD;  Location: [facility] ORS;  Service: Gynecology;  Laterality: Left;   UPPER GI ENDOSCOPY  2010   with biopsy of gastric erosion   VAGINAL HYSTERECTOMY N/A 04/09/2016   Procedure: HYSTERECTOMY VAGINAL;  Surgeon: Gae Dry, MD;  Location: [facility] ORS;  Service: Gynecology;  Laterality: N/A;   VAGINAL HYSTERECTOMY  2017   and bladder tack    Allergies: Allergies as of 08/18/2022 - Review Complete 08/13/2022  Allergen Reaction Noted   Codeine Shortness Of Breath and Itching 03/07/2013   Levofloxacin  11/08/2013   Oxycodone Itching and Shortness Of Breath 02/24/2016   Ultram [tramadol] Itching and Other (See Comments) 03/07/2013   Nitrofurantoin Diarrhea 03/07/2013   Nsaids Other (See Comments) 03/07/2013   Sulfacetamide Rash 08/20/2017   Adhesive [tape] Other (See Comments) 02/11/2018   Azithromycin  02/05/2014   Ciprofloxacin Other (See Comments) 03/07/2013   Ketorolac Itching 12/14/2013   Sulfa antibiotics Itching, Rash, and Other (See Comments) 03/07/2013   Tolmetin  05/29/2014   Zofran [ondansetron hcl] Other (See Comments) 02/11/2018    Medications: No outpatient medications have been  marked as taking for the 08/18/22 encounter (Office Visit) with Meade Maw, MD.    Social History: Social History   Tobacco Use   Smoking status: Former    Types: Cigarettes    Quit date: 1967    Years since quitting: 57.0   Smokeless tobacco: Never  Vaping Use   Vaping Use: Never used  Substance Use Topics   Alcohol use: Yes   Drug use: No    Family Medical History: Family History  Problem Relation Age of Onset   Pulmonary fibrosis Mother    Melanoma Father    COPD Sister    Cardiomyopathy Sister        and arrhythmia   Atrial fibrillation Sister    COPD Brother    Pulmonary fibrosis Brother    Cancer Brother    Breast cancer Cousin        paternal 1st    Physical Examination:    Medical Decision Making  Imaging: Xrays  05/19/22 T12 kyphoplasty with some coronal plane deformity (mild)  Xrays 08/17/2022 stable  I have personally reviewed the images and agree with the above interpretation.  Assessment and Plan: Ms. Eckhart is a pleasant 77 y.o. female with T12 compression fracture status post kyphoplasty.  She has improved compared to the last time I saw her.  Her imaging is stable.  We will see her back as needed.  She will continue her evaluation for radiofrequency ablation of her lower back.   This visit was performed via telephone.  Patient location: home Provider location: office  I spent a total of 5 minutes non-face-to-face activities for this visit on the date of this encounter including review of current clinical condition and response to treatment.   Thank you for involving me in the care of this patient.      Jeston Junkins K. Izora Ribas MD, [facility] Neurosurgery

## 2022-08-18 NOTE — Telephone Encounter (Signed)
HR record since starting diltiazem 08/12/22 08/13/22 84HR 08/14/22 71HR 08/15/22 73HR 08/16/22 74HR 08/17/22 81HR 08/18/22 80HR  Reaching out to patient to offer assistance regarding upcoming cardiac imaging study; pt verbalizes understanding of appt date/time, parking situation and where to check in, pre-test NPO status and medications ordered, and verified current allergies; name and call back number provided for further questions should they arise [name] RN Navigator Cardiac Imaging [facility] [phone] office [phone] cell  '100mg'$  metoprolol tartrate + '15mg'$  ivabradine Hold lasix Arrival [facility]

## 2022-08-20 ENCOUNTER — Ambulatory Visit
Admission: RE | Admit: 2022-08-20 | Discharge: 2022-08-20 | Disposition: A | Discharge status: Home / Self Care | Payer: Medicare Other | Source: Ambulatory Visit | Attending: Internal Medicine | Admitting: Internal Medicine

## 2022-08-20 DIAGNOSIS — I2510 Atherosclerotic heart disease of native coronary artery without angina pectoris: Secondary | ICD-10-CM | POA: Insufficient documentation

## 2022-08-20 LAB — POCT I-STAT CREATININE: Creatinine, Ser: 0.7 mg/dL (ref 0.44–1.00)

## 2022-08-20 MED ORDER — IOHEXOL 350 MG/ML SOLN
75.0000 mL | Freq: Once | INTRAVENOUS | Status: AC | PRN
  Administered 2022-08-20: 75 mL via INTRAVENOUS

## 2022-08-20 MED ORDER — NITROGLYCERIN 0.4 MG SL SUBL
0.8000 mg | SUBLINGUAL_TABLET | Freq: Once | SUBLINGUAL | Status: AC
  Administered 2022-08-20: 0.8 mg via SUBLINGUAL

## 2022-08-20 NOTE — Progress Notes (Signed)
Patient tolerated CT well. Drank water after. Vital signs stable encourage to drink water throughout day.Reasons explained and verbalized understanding. Ambulated steady gait with her cane.

## 2022-08-26 DIAGNOSIS — M47816 Spondylosis without myelopathy or radiculopathy, lumbar region: Secondary | ICD-10-CM | POA: Diagnosis not present

## 2022-08-27 ENCOUNTER — Ambulatory Visit: Payer: Self-pay | Admitting: Physician Assistant

## 2022-08-31 ENCOUNTER — Encounter: Payer: Self-pay | Admitting: Oncology

## 2022-09-04 ENCOUNTER — Encounter: Payer: Self-pay | Admitting: Oncology

## 2022-09-07 ENCOUNTER — Encounter: Payer: Self-pay | Admitting: Oncology

## 2022-09-14 ENCOUNTER — Ambulatory Visit (INDEPENDENT_AMBULATORY_CARE_PROVIDER_SITE_OTHER): Payer: Medicare Other | Admitting: Physician Assistant

## 2022-09-14 ENCOUNTER — Encounter: Payer: Self-pay | Admitting: Physician Assistant

## 2022-09-14 ENCOUNTER — Encounter: Payer: Self-pay | Admitting: Oncology

## 2022-09-14 VITALS — BP 130/80 | HR 89 | Temp 98.0°F | Resp 16 | Ht 61.0 in | Wt 144.0 lb

## 2022-09-14 DIAGNOSIS — E782 Mixed hyperlipidemia: Secondary | ICD-10-CM | POA: Diagnosis not present

## 2022-09-14 DIAGNOSIS — Z0001 Encounter for general adult medical examination with abnormal findings: Secondary | ICD-10-CM

## 2022-09-14 DIAGNOSIS — F331 Major depressive disorder, recurrent, moderate: Secondary | ICD-10-CM

## 2022-09-14 DIAGNOSIS — R300 Dysuria: Secondary | ICD-10-CM

## 2022-09-14 DIAGNOSIS — R5383 Other fatigue: Secondary | ICD-10-CM

## 2022-09-14 DIAGNOSIS — R7303 Prediabetes: Secondary | ICD-10-CM | POA: Diagnosis not present

## 2022-09-14 DIAGNOSIS — Z9189 Other specified personal risk factors, not elsewhere classified: Secondary | ICD-10-CM | POA: Diagnosis not present

## 2022-09-14 DIAGNOSIS — E039 Hypothyroidism, unspecified: Secondary | ICD-10-CM | POA: Diagnosis not present

## 2022-09-14 DIAGNOSIS — J4520 Mild intermittent asthma, uncomplicated: Secondary | ICD-10-CM | POA: Diagnosis not present

## 2022-09-14 DIAGNOSIS — I10 Essential (primary) hypertension: Secondary | ICD-10-CM

## 2022-09-14 MED ORDER — CITALOPRAM HYDROBROMIDE 20 MG PO TABS: 20.0000 mg | ORAL_TABLET | Freq: Every day | ORAL | 3 refills | Status: DC

## 2022-09-14 MED ORDER — ALBUTEROL SULFATE HFA 108 (90 BASE) MCG/ACT IN AERS: 2.0000 | INHALATION_SPRAY | Freq: Four times a day (QID) | RESPIRATORY_TRACT | 0 refills | Status: DC | PRN

## 2022-09-14 MED ORDER — ROSUVASTATIN CALCIUM 5 MG PO TABS: 5.0000 mg | ORAL_TABLET | Freq: Every day | ORAL | 3 refills | Status: DC

## 2022-09-14 NOTE — Progress Notes (Signed)
[facility] [place], [place] [address]  Internal MEDICINE  Office Visit Note  Patient Name: Vanessa Evans  [id number]  [id number]  Date of Service: 09/22/2022  Chief Complaint  Patient presents with   Medicare Wellness   Depression   Gastroesophageal Reflux   Hypertension   Hyperlipidemia   Quality Metric Gaps    Pneumonia Vaccine     HPI Pt is here for routine health maintenance examination -Has cardiology follow up tomorrow -Seeing physiatry on Wednesday -still struggling with loss of her son, will increase celexa -brace on right ankle still, followed by podiatry -Sees endo for thyroid and prediabetes, but reports only annual visit and would like these labs rechecked with other labs -UTD on PHM  Current Medication: Outpatient Encounter Medications as of 09/14/2022  Medication Sig   acetaminophen (TYLENOL) 500 MG tablet Take 1,000 mg by mouth 2 (two) times daily as needed for moderate pain.   albuterol (PROVENTIL) (2.5 MG/3ML) 0.083% nebulizer solution Take 3 mLs (2.5 mg total) by nebulization every 6 (six) hours as needed for wheezing or shortness of breath.   albuterol (VENTOLIN HFA) 108 (90 Base) MCG/ACT inhaler Inhale 2 puffs into the lungs every 6 (six) hours as needed for wheezing or shortness of breath.   aspirin EC 81 MG tablet Take 81 mg by mouth at bedtime.    BLACK ELDERBERRY PO Take 1 Dose by mouth 2 (two) times a day.   Calcium Carbonate-Vitamin D 600-400 MG-UNIT tablet Take 1 tablet by mouth 2 (two) times daily.   cholecalciferol (VITAMIN D) 1000 UNITS tablet Take 1,000 Units by mouth at bedtime.    citalopram (CELEXA) 20 MG tablet Take 1 tablet (20 mg total) by mouth daily.   cyanocobalamin 500 MCG tablet Take 500 mcg by mouth daily.   Dextran 70-Hypromellose (ARTIFICIAL TEARS) 0.1-0.3 % SOLN Apply 1-2 drops to eye daily.   diclofenac Sodium (VOLTAREN) 1 % GEL Apply 4 g topically 4 (four) times daily.   diltiazem (CARDIZEM CD)  120 MG 24 hr capsule Take 180 mg by mouth at bedtime.   diphenhydrAMINE (BENADRYL) 25 MG tablet Take 25-50 mg by mouth daily as needed for itching.   docusate sodium (COLACE) 100 MG capsule Take 200 mg by mouth at bedtime as needed for moderate constipation.    EPINEPHrine (EPIPEN 2-PAK) 0.3 mg/0.3 mL IJ SOAJ injection use as directed for severe allergy reaction   fenofibrate 160 MG tablet TAKE 1 TABLET DAILY FOR CHOLESTEROL   fluticasone (FLONASE) 50 MCG/ACT nasal spray Use 2 sprays in each nostril daily,   folic acid (FOLVITE) A999333 MCG tablet Take 400 mcg by mouth daily.   guaiFENesin (MUCINEX) 600 MG 12 hr tablet Take 600 mg by mouth at bedtime as needed (congestion).   Hypromellose (ARTIFICIAL TEARS OP) Place 1-2 drops into both eyes daily as needed (dry eyes).   ketotifen (ZADITOR) 0.025 % ophthalmic solution Place 1 drop into both eyes 2 (two) times daily as needed (allergies).   levocetirizine (XYZAL) 5 MG tablet Take 1 tablet (5 mg total) by mouth every evening.   levothyroxine (SYNTHROID, LEVOTHROID) 50 MCG tablet Take 50 mcg by mouth daily before breakfast. Brand Name only   losartan (COZAAR) 25 MG tablet Take 25 mg by mouth daily.   Magnesium 250 MG TABS Take 1 tablet by mouth daily.   Melatonin 5 MG TABS Take 1 tablet by mouth at bedtime. For sleep   Multiple Vitamins-Minerals (MULTIVITAMIN WITH MINERALS) tablet Take 1 tablet  by mouth daily.   OXYGEN Inhale into the lungs. 2 litre at night   pantoprazole (PROTONIX) 40 MG tablet Take 1 tablet (40 mg total) by mouth 2 (two) times daily.   pyridoxine (B-6) 100 MG tablet Take 100 mg by mouth daily.   raloxifene (EVISTA) 60 MG tablet Take 60 mg by mouth daily.   sodium chloride (OCEAN) 0.65 % SOLN nasal spray Place 1 spray into both nostrils 2 (two) times daily.   sucralfate (CARAFATE) 1 g tablet Take 3 times daily with meals.   Vitamin A 7.5 MG (25000 UT) CAPS Take 1 tablet by mouth 2 (two) times daily.   zinc gluconate 50 MG tablet  Take 50 mg by mouth daily.   [DISCONTINUED] albuterol (VENTOLIN HFA) 108 (90 Base) MCG/ACT inhaler Inhale 2 puffs into the lungs every 4 (four) hours as needed for wheezing or shortness of breath.   [DISCONTINUED] citalopram (CELEXA) 10 MG tablet Take 1 tablet (10 mg total) by mouth daily.   [DISCONTINUED] rosuvastatin (CRESTOR) 5 MG tablet Take 1 tablet (5 mg total) by mouth daily.   metoprolol tartrate (LOPRESSOR) 100 MG tablet Take 1 tablet (100 mg total) by mouth once for 1 dose. Please take one time dose 159m metoprolol tartrate 2 hr prior to cardiac CT for HR control IF HR >55bpm.   rosuvastatin (CRESTOR) 5 MG tablet Take 1 tablet (5 mg total) by mouth daily.   No facility-administered encounter medications on file as of 09/14/2022.    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 cope.  all benign.  inflammatory process going on in bladder   BREAST BIOPSY Right    benign   BREAST SURGERY Right    lumpectomy   CATARACT EXTRACTION W/PHACO Left 12/25/2014   Procedure: CATARACT EXTRACTION PHACO AND INTRAOCULAR LENS PLACEMENT [place]  Surgeon: WBirder Robson MD;  Location: [facility];  Service: Ophthalmology;  Laterality: Left;  [place]:32 AP% 20.0 CDE 6.49   COLONOSCOPY W/ BIOPSIES     COLONOSCOPY W/ POLYPECTOMY  2002, 2004, 2005   adenomatous polyps removed   COLONOSCOPY WITH PROPOFOL N/A 08/23/2017   Procedure: COLONOSCOPY WITH PROPOFOL;  Surgeon: EManya Silvas MD;  [facility];  Service: Endoscopy;  Laterality: N/A;   COLONOSCOPY WITH PROPOFOL N/A 11/08/2017   Procedure: COLONOSCOPY WITH PROPOFOL;  Surgeon: EManya Silvas MD;  [facility];  Service: Endoscopy;  Laterality: N/A;   COLONOSCOPY WITH PROPOFOL N/A 06/30/2021   Procedure: COLONOSCOPY WITH PROPOFOL;   Surgeon: LLesly Rubenstein MD;  Location: [facility];  Service: Endoscopy;  Laterality: N/A;   CYSTOCELE REPAIR N/A 04/09/2016   Procedure: ANTERIOR REPAIR (CYSTOCELE);  Surgeon: RGae Dry MD;  Location: [facility];  Service: Gynecology;  Laterality: N/A;   DIAGNOSTIC LAPAROSCOPY  2008   removed both ovaries with cysts, tubes and fibroids   DILATION AND CURETTAGE OF UTERUS  1973   ESOPHAGOGASTRODUODENOSCOPY (EGD) WITH PROPOFOL N/A 08/23/2017   Procedure: ESOPHAGOGASTRODUODENOSCOPY (EGD) WITH PROPOFOL;  Surgeon: EManya Silvas MD;  [facility];  Service: Endoscopy;  Laterality: N/A;   ESOPHAGOGASTRODUODENOSCOPY (EGD) WITH PROPOFOL N/A 06/30/2021   Procedure: ESOPHAGOGASTRODUODENOSCOPY (EGD) WITH PROPOFOL;  Surgeon: LLesly Rubenstein MD;  Location: [facility];  Service: Endoscopy;  Laterality: N/A;  EYE SURGERY Bilateral 2016   HERNIA REPAIR Right 2008   Inguinal hernia Repair, ventral hernia repair   IR KYPHO THORACIC WITH BONE BIOPSY  05/12/2022   IR RADIOLOGIST EVAL & MGMT  05/19/2022   IR RADIOLOGIST EVAL & MGMT  05/26/2022   JOINT REPLACEMENT Right 2008   knee   KNEE ARTHROSCOPY Right    LUMBAR LAMINECTOMY/DECOMPRESSION MICRODISCECTOMY Right 03/21/2013   Procedure: Right Lumbar five-Sacral one Laminectomy for Synovial Cyst;  Surgeon: Faythe Ghee, MD;  Location: [place] [facility];  Service: Neurosurgery;  Laterality: Right;  right   OOPHORECTOMY     REVERSE SHOULDER ARTHROPLASTY Right 02/22/2018   Procedure: REVERSE SHOULDER ARTHROPLASTY;  Surgeon: Corky Mull, MD;  Location: [facility];  Service: Orthopedics;  Laterality: Right;   TUBAL LIGATION     UNILATERAL SALPINGECTOMY Left 04/09/2016   Procedure: UNILATERAL SALPINGECTOMY;  Surgeon: Gae Dry, MD;  Location: [facility];  Service: Gynecology;  Laterality: Left;   UPPER GI ENDOSCOPY  2010   with biopsy of gastric erosion   VAGINAL HYSTERECTOMY N/A 04/09/2016   Procedure: HYSTERECTOMY VAGINAL;   Surgeon: Gae Dry, MD;  Location: [facility];  Service: Gynecology;  Laterality: N/A;   VAGINAL HYSTERECTOMY  2017   and bladder tack    Medical History: Past Medical History:  Diagnosis Date   Arthritis    osteo   Asthma    needs rescue inhaler few times a year   Claustrophobia 04/21/2020   Depression    Dizziness    light headed spells but it has been a while   Dysrhythmia    tachycardia.(cardizem). brady during procedures   Fibromyalgia    GERD (gastroesophageal reflux disease)    gastritis   Headache(784.0)    Heart murmur 2019   aortic. dr. Nehemiah Massed not worried about this   Hyperlipidemia    Hypertension    Hypothyroidism    Macular degeneration    Migraine    Orthopnea    Pneumonia    long time ago (greater than 5 years ago)   PONV (postoperative nausea and vomiting)    very anxious during cataract surgery. brady during colonoscopy   Shortness of breath    any exertion, cannot lie flat    Family History: Family History  Problem Relation Age of Onset   Pulmonary fibrosis Mother    Melanoma Father    COPD Sister    Cardiomyopathy Sister        and arrhythmia   Atrial fibrillation Sister    COPD Brother    Pulmonary fibrosis Brother    Cancer Brother    Breast cancer Cousin        paternal 1st      Review of Systems  Constitutional:  Negative for chills, fatigue and unexpected weight change.  HENT:  Negative for congestion, rhinorrhea, sneezing and sore throat.   Eyes:  Negative for redness.  Respiratory:  Negative for cough, chest tightness and shortness of breath.   Cardiovascular:  Negative for chest pain and palpitations.  Gastrointestinal:  Negative for abdominal pain, constipation, diarrhea, nausea and vomiting.  Genitourinary:  Negative for dysuria and frequency.  Musculoskeletal:  Positive for arthralgias and back pain. Negative for joint swelling and neck pain.  Skin:  Negative for rash.  Neurological: Negative.  Negative for tremors  and numbness.  Hematological:  Negative for adenopathy. Does not bruise/bleed easily.  Psychiatric/Behavioral:  Positive for dysphoric mood. Negative for sleep disturbance and suicidal ideas. Behavioral problem: Depression.The  patient is nervous/anxious.      Vital Signs: BP 130/80   Pulse 89   Temp 98 F (36.7 C)   Resp 16   Ht 5' 1"$  (1.549 m)   Wt 144 lb (65.3 kg)   SpO2 97%   BMI 27.21 kg/m    Physical Exam Vitals and nursing note reviewed.  Constitutional:      General: She is not in acute distress.    Appearance: Normal appearance. She is well-developed. She is not diaphoretic.  HENT:     Head: Normocephalic and atraumatic.     Right Ear: Tympanic membrane normal.     Left Ear: Tympanic membrane normal.     Mouth/Throat:     Pharynx: No oropharyngeal exudate.  Eyes:     Pupils: Pupils are equal, round, and reactive to light.  Neck:     Thyroid: No thyromegaly.     Vascular: No JVD.     Trachea: No tracheal deviation.  Cardiovascular:     Rate and Rhythm: Normal rate and regular rhythm.     Heart sounds: Normal heart sounds. No murmur heard.    No friction rub. No gallop.  Pulmonary:     Effort: Pulmonary effort is normal. No respiratory distress.     Breath sounds: No wheezing or rales.  Chest:     Chest wall: No tenderness.  Abdominal:     General: Bowel sounds are normal.     Palpations: Abdomen is soft.     Tenderness: There is no abdominal tenderness.  Musculoskeletal:     Cervical back: Normal range of motion and neck supple.     Comments: Wearing right ankle brace  Lymphadenopathy:     Cervical: No cervical adenopathy.  Skin:    General: Skin is warm and dry.  Neurological:     Mental Status: She is alert and oriented to person, place, and time.     Cranial Nerves: No cranial nerve deficit.  Psychiatric:        Behavior: Behavior normal.        Thought Content: Thought content normal.        Judgment: Judgment normal.      LABS: Recent  Results (from the past 2160 hour(s))  I-STAT creatinine     Status: None   Collection Time: 08/20/22 10:30 AM  Result Value Ref Range   Creatinine, Ser 0.70 0.44 - 1.00 mg/dL  UA/M w/rflx Culture, Routine     Status: None   Collection Time: 09/14/22  3:35 PM   Specimen: Urine   Urine  Result Value Ref Range   Specific Gravity, UA 1.007 1.005 - 1.030   pH, UA 7.0 5.0 - 7.5   Color, UA Yellow Yellow   Appearance Ur Clear Clear   Leukocytes,UA Negative Negative   Protein,UA Negative Negative/Trace   Glucose, UA Negative Negative   Ketones, UA Negative Negative   RBC, UA Negative Negative   Bilirubin, UA Negative Negative   Urobilinogen, Ur 0.2 0.2 - 1.0 mg/dL   Nitrite, UA Negative Negative   Microscopic Examination Comment     Comment: Microscopic follows if indicated.   Microscopic Examination See below:     Comment: Microscopic was indicated and was performed.   Urinalysis Reflex Comment     Comment: This specimen will not reflex to a Urine Culture.  Microscopic Examination     Status: None   Collection Time: 09/14/22  3:35 PM   Urine  Result Value Ref Range  WBC, UA None seen 0 - 5 /hpf   RBC, Urine None seen 0 - 2 /hpf   Epithelial Cells (non renal) None seen 0 - 10 /hpf   Casts None seen None seen /lpf   Bacteria, UA None seen None seen/Few        Assessment/Plan: 1. Encounter for general adult medical examination with abnormal findings CPE performed, routine fasting labs ordered, UTD on PHM  2. Moderate episode of recurrent major depressive disorder (HCC) Will increase celexa to 75m - citalopram (CELEXA) 20 MG tablet; Take 1 tablet (20 mg total) by mouth daily.  Dispense: 30 tablet; Refill: 3  3. Benign essential HTN Stable, continue current medication  4. Mixed hyperlipidemia Continue crestor and will update labs - Lipid Panel With LDL/HDL Ratio  5. Prediabetes Followed by endo, but would like to have labs checked with other blood work - Hgb A1C w/o  eAG  6. Hypothyroidism, unspecified type Followed by endo, but would like to go ahead and have labs checked with other blood work - TSH + free T4  7. Candidate for statin therapy due to risk of future cardiovascular event - rosuvastatin (CRESTOR) 5 MG tablet; Take 1 tablet (5 mg total) by mouth daily.  Dispense: 90 tablet; Refill: 3  8. Chronic asthma, mild intermittent, uncomplicated - albuterol (VENTOLIN HFA) 108 (90 Base) MCG/ACT inhaler; Inhale 2 puffs into the lungs every 6 (six) hours as needed for wheezing or shortness of breath.  Dispense: 8 g; Refill: 0  9. Other fatigue - CBC w/Diff/Platelet - Comprehensive metabolic panel - TSH + free T4 - Lipid Panel With LDL/HDL Ratio - Hgb A1C w/o eAG  10. Dysuria - UA/M w/rflx Culture, Routine   General Counseling: Takyia verbalizes understanding of the findings of todays visit and agrees with plan of treatment. I have discussed any further diagnostic evaluation that may be needed or ordered today. We also reviewed her medications today. she has been encouraged to call the office with any questions or concerns that should arise related to todays visit.    Counseling:    Orders Placed This Encounter  Procedures   Microscopic Examination   UA/M w/rflx Culture, Routine   CBC w/Diff/Platelet   Comprehensive metabolic panel   TSH + free T4   Lipid Panel With LDL/HDL Ratio   Hgb A1C w/o eAG    Meds ordered this encounter  Medications   albuterol (VENTOLIN HFA) 108 (90 Base) MCG/ACT inhaler    Sig: Inhale 2 puffs into the lungs every 6 (six) hours as needed for wheezing or shortness of breath.    Dispense:  8 g    Refill:  0    Generic albuterol   rosuvastatin (CRESTOR) 5 MG tablet    Sig: Take 1 tablet (5 mg total) by mouth daily.    Dispense:  90 tablet    Refill:  3   citalopram (CELEXA) 20 MG tablet    Sig: Take 1 tablet (20 mg total) by mouth daily.    Dispense:  30 tablet    Refill:  3    This patient was seen  by LDrema Dallas PA-C in collaboration with Dr. FClayborn Bignessas a part of collaborative care agreement.  Total time spent:35 Minutes  Time spent includes review of chart, medications, test results, and follow up plan with the patient.     FLavera Guise MD  Internal Medicine

## 2022-09-15 DIAGNOSIS — K219 Gastro-esophageal reflux disease without esophagitis: Secondary | ICD-10-CM | POA: Diagnosis not present

## 2022-09-15 DIAGNOSIS — R0902 Hypoxemia: Secondary | ICD-10-CM | POA: Diagnosis not present

## 2022-09-15 DIAGNOSIS — I25119 Atherosclerotic heart disease of native coronary artery with unspecified angina pectoris: Secondary | ICD-10-CM | POA: Diagnosis not present

## 2022-09-15 DIAGNOSIS — I2510 Atherosclerotic heart disease of native coronary artery without angina pectoris: Secondary | ICD-10-CM | POA: Diagnosis not present

## 2022-09-15 DIAGNOSIS — J4520 Mild intermittent asthma, uncomplicated: Secondary | ICD-10-CM | POA: Diagnosis not present

## 2022-09-15 DIAGNOSIS — R002 Palpitations: Secondary | ICD-10-CM | POA: Diagnosis not present

## 2022-09-15 DIAGNOSIS — I471 Supraventricular tachycardia, unspecified: Secondary | ICD-10-CM | POA: Diagnosis not present

## 2022-09-15 DIAGNOSIS — Z8616 Personal history of COVID-19: Secondary | ICD-10-CM | POA: Diagnosis not present

## 2022-09-15 DIAGNOSIS — I700 Atherosclerosis of aorta: Secondary | ICD-10-CM | POA: Diagnosis not present

## 2022-09-15 DIAGNOSIS — I10 Essential (primary) hypertension: Secondary | ICD-10-CM | POA: Diagnosis not present

## 2022-09-15 LAB — UA/M W/RFLX CULTURE, ROUTINE
Bilirubin, UA: NEGATIVE
Glucose, UA: NEGATIVE
Ketones, UA: NEGATIVE
Leukocytes,UA: NEGATIVE
Nitrite, UA: NEGATIVE
Protein,UA: NEGATIVE
RBC, UA: NEGATIVE
Specific Gravity, UA: 1.007 (ref 1.005–1.030)
Urobilinogen, Ur: 0.2 mg/dL (ref 0.2–1.0)
pH, UA: 7 (ref 5.0–7.5)

## 2022-09-15 LAB — MICROSCOPIC EXAMINATION
Bacteria, UA: NONE SEEN
Casts: NONE SEEN /lpf
Epithelial Cells (non renal): NONE SEEN /hpf (ref 0–10)
RBC, Urine: NONE SEEN /hpf (ref 0–2)
WBC, UA: NONE SEEN /hpf (ref 0–5)

## 2022-09-16 DIAGNOSIS — M47816 Spondylosis without myelopathy or radiculopathy, lumbar region: Secondary | ICD-10-CM | POA: Diagnosis not present

## 2022-09-18 ENCOUNTER — Encounter: Payer: Self-pay | Admitting: Podiatry

## 2022-09-18 ENCOUNTER — Ambulatory Visit (INDEPENDENT_AMBULATORY_CARE_PROVIDER_SITE_OTHER): Payer: Medicare Other | Admitting: Podiatry

## 2022-09-18 VITALS — BP 137/59 | HR 74

## 2022-09-18 DIAGNOSIS — M7671 Peroneal tendinitis, right leg: Secondary | ICD-10-CM | POA: Diagnosis not present

## 2022-09-18 NOTE — Progress Notes (Signed)
Subjective:  Patient ID: Vanessa Evans, female    DOB: 1945/09/03,  MRN: [id number]  Chief Complaint  Patient presents with   Foot Pain    77 y.o. female presents with the above complaint.  Patient presents for follow-up of right painful peroneal tendinitis and posterior tibial tendinitis.  The injection in the posterior tibial tendon helped considerably.  She states the outside part of the foot is now starting to hurt her.  She would like an injection there.   Review of Systems: Negative except as noted in the HPI. Denies N/V/F/Ch.  Past Medical History:  Diagnosis Date   Arthritis    osteo   Asthma    needs rescue inhaler few times a year   Claustrophobia 04/21/2020   Depression    Dizziness    light headed spells but it has been a while   Dysrhythmia    tachycardia.(cardizem). brady during procedures   Fibromyalgia    GERD (gastroesophageal reflux disease)    gastritis   Headache(784.0)    Heart murmur 2019   aortic. dr. Nehemiah Massed not worried about this   Hyperlipidemia    Hypertension    Hypothyroidism    Macular degeneration    Migraine    Orthopnea    Pneumonia    long time ago (greater than 5 years ago)   PONV (postoperative nausea and vomiting)    very anxious during cataract surgery. brady during colonoscopy   Shortness of breath    any exertion, cannot lie flat    Current Outpatient Medications:    acetaminophen (TYLENOL) 500 MG tablet, Take 1,000 mg by mouth 2 (two) times daily as needed for moderate pain., Disp: , Rfl:    albuterol (PROVENTIL) (2.5 MG/3ML) 0.083% nebulizer solution, Take 3 mLs (2.5 mg total) by nebulization every 6 (six) hours as needed for wheezing or shortness of breath., Disp: 75 mL, Rfl: 3   albuterol (VENTOLIN HFA) 108 (90 Base) MCG/ACT inhaler, Inhale 2 puffs into the lungs every 4 (four) hours as needed for wheezing or shortness of breath., Disp: 18 g, Rfl: 5   aspirin EC 81 MG tablet, Take 81 mg by mouth at bedtime. , Disp: ,  Rfl:    BLACK ELDERBERRY PO, Take 1 Dose by mouth 2 (two) times a day., Disp: , Rfl:    Calcium Carbonate-Vitamin D 600-400 MG-UNIT tablet, Take 1 tablet by mouth 2 (two) times daily., Disp: , Rfl:    cholecalciferol (VITAMIN D) 1000 UNITS tablet, Take 1,000 Units by mouth at bedtime. , Disp: , Rfl:    citalopram (CELEXA) 10 MG tablet, Take 1 tablet (10 mg total) by mouth daily., Disp: 90 tablet, Rfl: 0   cyanocobalamin 500 MCG tablet, Take 500 mcg by mouth daily., Disp: , Rfl:    Dextran 70-Hypromellose (ARTIFICIAL TEARS) 0.1-0.3 % SOLN, Apply 1-2 drops to eye daily., Disp: , Rfl:    diclofenac Sodium (VOLTAREN) 1 % GEL, Apply 4 g topically 4 (four) times daily., Disp: 500 g, Rfl: 3   diltiazem (CARDIZEM CD) 120 MG 24 hr capsule, Take 120 mg by mouth at bedtime. , Disp: , Rfl: 3   diphenhydrAMINE (BENADRYL) 25 MG tablet, Take 25-50 mg by mouth daily as needed for itching., Disp: , Rfl:    docusate sodium (COLACE) 100 MG capsule, Take 200 mg by mouth at bedtime as needed for moderate constipation. , Disp: , Rfl:    EPINEPHrine (EPIPEN 2-PAK) 0.3 mg/0.3 mL IJ SOAJ injection, use as directed for severe  allergy reaction, Disp: 2 each, Rfl: 1   fenofibrate 160 MG tablet, TAKE 1 TABLET DAILY FOR CHOLESTEROL, Disp: 90 tablet, Rfl: 0   fluticasone (FLONASE) 50 MCG/ACT nasal spray, Use 2 sprays in each nostril daily,, Disp: 16 g, Rfl: 2   folic acid (FOLVITE) A999333 MCG tablet, Take 400 mcg by mouth daily., Disp: , Rfl:    guaiFENesin (MUCINEX) 600 MG 12 hr tablet, Take 600 mg by mouth at bedtime as needed (congestion)., Disp: , Rfl:    Hypromellose (ARTIFICIAL TEARS OP), Place 1-2 drops into both eyes daily as needed (dry eyes)., Disp: , Rfl:    ketotifen (ZADITOR) 0.025 % ophthalmic solution, Place 1 drop into both eyes 2 (two) times daily as needed (allergies)., Disp: , Rfl:    levocetirizine (XYZAL) 5 MG tablet, Take 1 tablet (5 mg total) by mouth every evening., Disp: 90 tablet, Rfl: 0   levothyroxine  (SYNTHROID, LEVOTHROID) 50 MCG tablet, Take 50 mcg by mouth daily before breakfast. Brand Name only, Disp: , Rfl:    losartan (COZAAR) 25 MG tablet, Take 25 mg by mouth daily., Disp: , Rfl:    Magnesium 250 MG TABS, Take 1 tablet by mouth daily., Disp: , Rfl:    Melatonin 5 MG TABS, Take 1 tablet by mouth at bedtime. For sleep, Disp: , Rfl:    metoprolol tartrate (LOPRESSOR) 100 MG tablet, Take 1 tablet (100 mg total) by mouth once for 1 dose. Please take one time dose 195m metoprolol tartrate 2 hr prior to cardiac CT for HR control IF HR >55bpm., Disp: 1 tablet, Rfl: 0   Multiple Vitamins-Minerals (MULTIVITAMIN WITH MINERALS) tablet, Take 1 tablet by mouth daily., Disp: , Rfl:    OXYGEN, Inhale into the lungs. 2 litre at night, Disp: , Rfl:    pantoprazole (PROTONIX) 40 MG tablet, Take 1 tablet (40 mg total) by mouth 2 (two) times daily., Disp: 180 tablet, Rfl: 0   pyridoxine (B-6) 100 MG tablet, Take 100 mg by mouth daily., Disp: , Rfl:    raloxifene (EVISTA) 60 MG tablet, Take 60 mg by mouth daily., Disp: , Rfl:    rosuvastatin (CRESTOR) 5 MG tablet, Take 1 tablet (5 mg total) by mouth daily., Disp: 30 tablet, Rfl: 3   sodium chloride (OCEAN) 0.65 % SOLN nasal spray, Place 1 spray into both nostrils 2 (two) times daily., Disp: , Rfl:    sucralfate (CARAFATE) 1 g tablet, Take 3 times daily with meals., Disp: 90 tablet, Rfl: 2   Vitamin A 7.5 MG (25000 UT) CAPS, Take 1 tablet by mouth 2 (two) times daily., Disp: , Rfl:    zinc gluconate 50 MG tablet, Take 50 mg by mouth daily., Disp: , Rfl:   Social History   Tobacco Use  Smoking Status Former   Types: Cigarettes   Quit date: 1967   Years since quitting: 57.0  Smokeless Tobacco Never    Allergies  Allergen Reactions   Codeine Shortness Of Breath and Itching    Bronchospasms and asthma attach   Levofloxacin     Altered mental status": drunk" feeling, confused, speech problems Other reaction(s): Unknown    Oxycodone Itching and  Shortness Of Breath    Would take benadryl to eliminate itching. Has had bronchospasm   Ultram [Tramadol] Itching and Other (See Comments)    Bronchospasm and asthma attack   Nitrofurantoin Diarrhea    Tried again and had no problems with it Severe and long lasting   Nsaids Other (See Comments)  Bloody stools, abdominal pain, ulcer History of gastritis   Sulfacetamide Rash    Fever, abdominal pain   Adhesive [Tape] Other (See Comments)    Thin skin. Causes tears. Paper tape okay   Azithromycin     Unsure. Patient does not remember but thinks that it did not work for her.   Ciprofloxacin Other (See Comments)    Severe pain in neck and down back    Ketorolac Itching   Sulfa Antibiotics Itching, Rash and Other (See Comments)    fever   Tolmetin     Other reaction(s): Other (See Comments) Bloody stools, abdominal pain History of chronic gastritis   Zofran [Ondansetron Hcl] Other (See Comments)    constipation   Objective:   Vitals:   08/13/22 1519  BP: 130/72   There is no height or weight on file to calculate BMI. Constitutional Well developed. Well nourished.  Vascular Dorsalis pedis pulses palpable bilaterally. Posterior tibial pulses palpable bilaterally. Capillary refill normal to all digits.  No cyanosis or clubbing noted. Pedal hair growth normal.  Neurologic Normal speech. Oriented to person, place, and time. Epicritic sensation to light touch grossly present bilaterally.  Dermatologic Nails well groomed and normal in appearance. No open wounds. No skin lesions.  Orthopedic: No further pain on palpation along the course of the posterior tibial tendon no pain at the insertion.  No further pain with resisted plantarflexion inversion of the foot no pain with dorsiflexion eversion of the foot.  Pain on palpation to the peroneal tendon pain with dorsiflexion eversion of the foot resisted.  No pain with plantarflexion inversion of the foot   Radiographs:  None Assessment:   1. Posterior tibial tendinitis of right leg    Plan:  Patient was evaluated and treated and all questions answered.  Right posterior tibial tendinitis -All questions and concerns were discussed with the patient in extensive detail -clinically resolved with cam boot immobilization and steroid injection.  Right peroneal tendinitis. -Given that she is still having some residual pain along peroneal tendon as she would benefit from steroid injection help decrease acute inflammatory component associate with pain.  Patient agrees with plan to proceed with steroid injection to the peroneal tendon.  No follow-ups on file.

## 2022-09-21 DIAGNOSIS — H353211 Exudative age-related macular degeneration, right eye, with active choroidal neovascularization: Secondary | ICD-10-CM | POA: Diagnosis not present

## 2022-10-06 DIAGNOSIS — R5383 Other fatigue: Secondary | ICD-10-CM | POA: Diagnosis not present

## 2022-10-06 DIAGNOSIS — E782 Mixed hyperlipidemia: Secondary | ICD-10-CM | POA: Diagnosis not present

## 2022-10-06 DIAGNOSIS — E039 Hypothyroidism, unspecified: Secondary | ICD-10-CM | POA: Diagnosis not present

## 2022-10-06 DIAGNOSIS — R7303 Prediabetes: Secondary | ICD-10-CM | POA: Diagnosis not present

## 2022-10-07 LAB — CBC WITH DIFFERENTIAL/PLATELET
Basophils Absolute: 0 10*3/uL (ref 0.0–0.2)
Basos: 1 %
EOS (ABSOLUTE): 0.2 10*3/uL (ref 0.0–0.4)
Eos: 3 %
Hematocrit: 37.2 % (ref 34.0–46.6)
Hemoglobin: 12.5 g/dL (ref 11.1–15.9)
Immature Grans (Abs): 0 10*3/uL (ref 0.0–0.1)
Immature Granulocytes: 0 %
Lymphocytes Absolute: 1.5 10*3/uL (ref 0.7–3.1)
Lymphs: 32 %
MCH: 28.6 pg (ref 26.6–33.0)
MCHC: 33.6 g/dL (ref 31.5–35.7)
MCV: 85 fL (ref 79–97)
Monocytes Absolute: 0.7 10*3/uL (ref 0.1–0.9)
Monocytes: 16 %
Neutrophils Absolute: 2.2 10*3/uL (ref 1.4–7.0)
Neutrophils: 48 %
Platelets: 287 10*3/uL (ref 150–450)
RBC: 4.37 x10E6/uL (ref 3.77–5.28)
RDW: 12 % (ref 11.7–15.4)
WBC: 4.6 10*3/uL (ref 3.4–10.8)

## 2022-10-07 LAB — COMPREHENSIVE METABOLIC PANEL
ALT: 20 IU/L (ref 0–32)
AST: 20 IU/L (ref 0–40)
Albumin/Globulin Ratio: 2.3 — ABNORMAL HIGH (ref 1.2–2.2)
Albumin: 4.6 g/dL (ref 3.8–4.8)
Alkaline Phosphatase: 62 IU/L (ref 44–121)
BUN/Creatinine Ratio: 19 (ref 12–28)
BUN: 15 mg/dL (ref 8–27)
Bilirubin Total: 0.3 mg/dL (ref 0.0–1.2)
CO2: 23 mmol/L (ref 20–29)
Calcium: 9.7 mg/dL (ref 8.7–10.3)
Chloride: 98 mmol/L (ref 96–106)
Creatinine, Ser: 0.77 mg/dL (ref 0.57–1.00)
Globulin, Total: 2 g/dL (ref 1.5–4.5)
Glucose: 96 mg/dL (ref 70–99)
Potassium: 4.9 mmol/L (ref 3.5–5.2)
Sodium: 136 mmol/L (ref 134–144)
Total Protein: 6.6 g/dL (ref 6.0–8.5)
eGFR: 79 mL/min/{1.73_m2} (ref >59)

## 2022-10-07 LAB — LIPID PANEL WITH LDL/HDL RATIO
Cholesterol, Total: 151 mg/dL (ref 100–199)
HDL: 55 mg/dL
LDL Chol Calc (NIH): 76 mg/dL (ref 0–99)
LDL/HDL Ratio: 1.4 ratio (ref 0.0–3.2)
Triglycerides: 113 mg/dL (ref 0–149)
VLDL Cholesterol Cal: 20 mg/dL (ref 5–40)

## 2022-10-07 LAB — TSH+FREE T4
Free T4: 1.39 ng/dL (ref 0.82–1.77)
TSH: 1.67 u[IU]/mL (ref 0.450–4.500)

## 2022-10-07 LAB — HGB A1C W/O EAG: Hgb A1c MFr Bld: 6 % — ABNORMAL HIGH (ref 4.8–5.6)

## 2022-10-12 DIAGNOSIS — M19012 Primary osteoarthritis, left shoulder: Secondary | ICD-10-CM | POA: Diagnosis not present

## 2022-10-12 DIAGNOSIS — M48062 Spinal stenosis, lumbar region with neurogenic claudication: Secondary | ICD-10-CM | POA: Diagnosis not present

## 2022-10-12 DIAGNOSIS — M5136 Other intervertebral disc degeneration, lumbar region: Secondary | ICD-10-CM | POA: Diagnosis not present

## 2022-10-12 DIAGNOSIS — M5416 Radiculopathy, lumbar region: Secondary | ICD-10-CM | POA: Diagnosis not present

## 2022-10-12 DIAGNOSIS — M545 Low back pain, unspecified: Secondary | ICD-10-CM | POA: Diagnosis not present

## 2022-10-15 ENCOUNTER — Encounter: Payer: Self-pay | Admitting: Physician Assistant

## 2022-10-15 ENCOUNTER — Ambulatory Visit (INDEPENDENT_AMBULATORY_CARE_PROVIDER_SITE_OTHER): Payer: Medicare Other | Admitting: Physician Assistant

## 2022-10-15 ENCOUNTER — Telehealth: Payer: Self-pay | Admitting: Physician Assistant

## 2022-10-15 VITALS — BP 140/68 | HR 73 | Temp 98.2°F | Resp 16 | Ht 61.0 in | Wt 142.0 lb

## 2022-10-15 DIAGNOSIS — E2839 Other primary ovarian failure: Secondary | ICD-10-CM | POA: Diagnosis not present

## 2022-10-15 DIAGNOSIS — E782 Mixed hyperlipidemia: Secondary | ICD-10-CM | POA: Diagnosis not present

## 2022-10-15 DIAGNOSIS — J301 Allergic rhinitis due to pollen: Secondary | ICD-10-CM

## 2022-10-15 DIAGNOSIS — I10 Essential (primary) hypertension: Secondary | ICD-10-CM

## 2022-10-15 DIAGNOSIS — F331 Major depressive disorder, recurrent, moderate: Secondary | ICD-10-CM | POA: Diagnosis not present

## 2022-10-15 MED ORDER — FLUTICASONE PROPIONATE 50 MCG/ACT NA SUSP: NASAL | 2 refills | Status: DC

## 2022-10-15 NOTE — Telephone Encounter (Signed)
Dexa order faxed to [facility]

## 2022-10-15 NOTE — Progress Notes (Signed)
[facility] [place], [place] [address]  Internal MEDICINE  Office Visit Note  Patient Name: Vanessa Evans  [id number]  [id number]  Date of Service: 10/15/2022  Chief Complaint  Patient presents with   Follow-up   Depression   Gastroesophageal Reflux   Hypertension   Hyperlipidemia    HPI Pt is here for routine follow up -Dr. Sharlet Salina is planning to do epidural to help her back pain -Labs reviewed and overall look good, A1c stable -Has been taking increased celexa ('20mg'$ ), has not seen a big difference and does not want to adjsut further today. She continues to grieve loss of her son. Denies SI -Cardiology added 1/2 tab of '25mg'$  metoprolol now -She is due for another bone density in April as will have been 2 years and since she did have a fracture last year, should go ahead and update this.  Current Medication: Outpatient Encounter Medications as of 10/15/2022  Medication Sig   acetaminophen (TYLENOL) 500 MG tablet Take 1,000 mg by mouth 2 (two) times daily as needed for moderate pain.   albuterol (PROVENTIL) (2.5 MG/3ML) 0.083% nebulizer solution Take 3 mLs (2.5 mg total) by nebulization every 6 (six) hours as needed for wheezing or shortness of breath.   albuterol (VENTOLIN HFA) 108 (90 Base) MCG/ACT inhaler Inhale 2 puffs into the lungs every 6 (six) hours as needed for wheezing or shortness of breath.   aspirin EC 81 MG tablet Take 81 mg by mouth at bedtime.    BLACK ELDERBERRY PO Take 1 Dose by mouth 2 (two) times a day.   Calcium Carbonate-Vitamin D 600-400 MG-UNIT tablet Take 1 tablet by mouth 2 (two) times daily.   cholecalciferol (VITAMIN D) 1000 UNITS tablet Take 1,000 Units by mouth at bedtime.    citalopram (CELEXA) 20 MG tablet Take 1 tablet (20 mg total) by mouth daily.   cyanocobalamin 500 MCG tablet Take 500 mcg by mouth daily.   Dextran 70-Hypromellose (ARTIFICIAL TEARS) 0.1-0.3 % SOLN Apply 1-2 drops to eye daily.   diclofenac Sodium  (VOLTAREN) 1 % GEL Apply 4 g topically 4 (four) times daily.   diltiazem (CARDIZEM CD) 120 MG 24 hr capsule Take 180 mg by mouth at bedtime.   diphenhydrAMINE (BENADRYL) 25 MG tablet Take 25-50 mg by mouth daily as needed for itching.   docusate sodium (COLACE) 100 MG capsule Take 200 mg by mouth at bedtime as needed for moderate constipation.    EPINEPHrine (EPIPEN 2-PAK) 0.3 mg/0.3 mL IJ SOAJ injection use as directed for severe allergy reaction   fenofibrate 160 MG tablet TAKE 1 TABLET DAILY FOR CHOLESTEROL   folic acid (FOLVITE) A999333 MCG tablet Take 400 mcg by mouth daily.   guaiFENesin (MUCINEX) 600 MG 12 hr tablet Take 600 mg by mouth at bedtime as needed (congestion).   Hypromellose (ARTIFICIAL TEARS OP) Place 1-2 drops into both eyes daily as needed (dry eyes).   ketotifen (ZADITOR) 0.025 % ophthalmic solution Place 1 drop into both eyes 2 (two) times daily as needed (allergies).   levocetirizine (XYZAL) 5 MG tablet Take 1 tablet (5 mg total) by mouth every evening.   levothyroxine (SYNTHROID, LEVOTHROID) 50 MCG tablet Take 50 mcg by mouth daily before breakfast. Brand Name only   losartan (COZAAR) 25 MG tablet Take 25 mg by mouth daily.   Magnesium 250 MG TABS Take 1 tablet by mouth daily.   Melatonin 5 MG TABS Take 1 tablet by mouth at bedtime. For sleep  metoprolol tartrate (LOPRESSOR) 25 MG tablet Take 12.5 mg by mouth daily.   Multiple Vitamins-Minerals (MULTIVITAMIN WITH MINERALS) tablet Take 1 tablet by mouth daily.   OXYGEN Inhale into the lungs. 2 litre at night   pantoprazole (PROTONIX) 40 MG tablet Take 1 tablet (40 mg total) by mouth 2 (two) times daily.   pyridoxine (B-6) 100 MG tablet Take 100 mg by mouth daily.   raloxifene (EVISTA) 60 MG tablet Take 60 mg by mouth daily.   rosuvastatin (CRESTOR) 5 MG tablet Take 1 tablet (5 mg total) by mouth daily.   sodium chloride (OCEAN) 0.65 % SOLN nasal spray Place 1 spray into both nostrils 2 (two) times daily.   sucralfate  (CARAFATE) 1 g tablet Take 3 times daily with meals.   Vitamin A 7.5 MG (25000 UT) CAPS Take 1 tablet by mouth 2 (two) times daily.   zinc gluconate 50 MG tablet Take 50 mg by mouth daily.   [DISCONTINUED] fluticasone (FLONASE) 50 MCG/ACT nasal spray Use 2 sprays in each nostril daily,   fluticasone (FLONASE) 50 MCG/ACT nasal spray Use 2 sprays in each nostril daily,   [DISCONTINUED] metoprolol tartrate (LOPRESSOR) 100 MG tablet Take 1 tablet (100 mg total) by mouth once for 1 dose. Please take one time dose '100mg'$  metoprolol tartrate 2 hr prior to cardiac CT for HR control IF HR >55bpm.   No facility-administered encounter medications on file as of 10/15/2022.    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 cope.  all benign.  inflammatory process going on in bladder   BREAST BIOPSY Right    benign   BREAST SURGERY Right    lumpectomy   CATARACT EXTRACTION W/PHACO Left 12/25/2014   Procedure: CATARACT EXTRACTION PHACO AND INTRAOCULAR LENS PLACEMENT ([place]);  Surgeon: Birder Robson, MD;  Location: [facility] ORS;  Service: Ophthalmology;  Laterality: Left;  [place] 00:32 AP% 20.0 CDE 6.49   COLONOSCOPY W/ BIOPSIES     COLONOSCOPY W/ POLYPECTOMY  2002, 2004, 2005   adenomatous polyps removed   COLONOSCOPY WITH PROPOFOL N/A 08/23/2017   Procedure: COLONOSCOPY WITH PROPOFOL;  Surgeon: Manya Silvas, MD;  Location: [facility] ENDOSCOPY;  Service: Endoscopy;  Laterality: N/A;   COLONOSCOPY WITH PROPOFOL N/A 11/08/2017   Procedure: COLONOSCOPY WITH PROPOFOL;  Surgeon: Manya Silvas, MD;  Location: [facility] ENDOSCOPY;  Service: Endoscopy;  Laterality: N/A;   COLONOSCOPY WITH PROPOFOL N/A 06/30/2021   Procedure: COLONOSCOPY WITH PROPOFOL;  Surgeon: Lesly Rubenstein, MD;  Location: [facility] ENDOSCOPY;  Service: Endoscopy;   Laterality: N/A;   CYSTOCELE REPAIR N/A 04/09/2016   Procedure: ANTERIOR REPAIR (CYSTOCELE);  Surgeon: Gae Dry, MD;  Location: [facility] ORS;  Service: Gynecology;  Laterality: N/A;   DIAGNOSTIC LAPAROSCOPY  2008   removed both ovaries with cysts, tubes and fibroids   DILATION AND CURETTAGE OF UTERUS  1973   ESOPHAGOGASTRODUODENOSCOPY (EGD) WITH PROPOFOL N/A 08/23/2017   Procedure: ESOPHAGOGASTRODUODENOSCOPY (EGD) WITH PROPOFOL;  Surgeon: Manya Silvas, MD;  Location: [facility] ENDOSCOPY;  Service: Endoscopy;  Laterality: N/A;   ESOPHAGOGASTRODUODENOSCOPY (EGD) WITH PROPOFOL N/A 06/30/2021   Procedure: ESOPHAGOGASTRODUODENOSCOPY (EGD) WITH PROPOFOL;  Surgeon: Lesly Rubenstein, MD;  Location: [facility] ENDOSCOPY;  Service: Endoscopy;  Laterality: N/A;   EYE SURGERY Bilateral 2016  HERNIA REPAIR Right 2008   Inguinal hernia Repair, ventral hernia repair   IR KYPHO THORACIC WITH BONE BIOPSY  05/12/2022   IR RADIOLOGIST EVAL & MGMT  05/19/2022   IR RADIOLOGIST EVAL & MGMT  05/26/2022   JOINT REPLACEMENT Right 2008   knee   KNEE ARTHROSCOPY Right    LUMBAR LAMINECTOMY/DECOMPRESSION MICRODISCECTOMY Right 03/21/2013   Procedure: Right Lumbar five-Sacral one Laminectomy for Synovial Cyst;  Surgeon: Faythe Ghee, MD;  Location: [place] NEURO ORS;  Service: Neurosurgery;  Laterality: Right;  right   OOPHORECTOMY     REVERSE SHOULDER ARTHROPLASTY Right 02/22/2018   Procedure: REVERSE SHOULDER ARTHROPLASTY;  Surgeon: Corky Mull, MD;  Location: [facility] ORS;  Service: Orthopedics;  Laterality: Right;   TUBAL LIGATION     UNILATERAL SALPINGECTOMY Left 04/09/2016   Procedure: UNILATERAL SALPINGECTOMY;  Surgeon: Gae Dry, MD;  Location: [facility] ORS;  Service: Gynecology;  Laterality: Left;   UPPER GI ENDOSCOPY  2010   with biopsy of gastric erosion   VAGINAL HYSTERECTOMY N/A 04/09/2016   Procedure: HYSTERECTOMY VAGINAL;  Surgeon: Gae Dry, MD;  Location: [facility] ORS;  Service: Gynecology;  Laterality:  N/A;   VAGINAL HYSTERECTOMY  2017   and bladder tack    Medical History: Past Medical History:  Diagnosis Date   Arthritis    osteo   Asthma    needs rescue inhaler few times a year   Claustrophobia 04/21/2020   Depression    Dizziness    light headed spells but it has been a while   Dysrhythmia    tachycardia.(cardizem). brady during procedures   Fibromyalgia    GERD (gastroesophageal reflux disease)    gastritis   Headache(784.0)    Heart murmur 2019   aortic. dr. Nehemiah Massed not worried about this   Hyperlipidemia    Hypertension    Hypothyroidism    Macular degeneration    Migraine    Orthopnea    Pneumonia    long time ago (greater than 5 years ago)   PONV (postoperative nausea and vomiting)    very anxious during cataract surgery. brady during colonoscopy   Shortness of breath    any exertion, cannot lie flat    Family History: Family History  Problem Relation Age of Onset   Pulmonary fibrosis Mother    Melanoma Father    COPD Sister    Cardiomyopathy Sister        and arrhythmia   Atrial fibrillation Sister    COPD Brother    Pulmonary fibrosis Brother    Cancer Brother    Breast cancer Cousin        paternal 1st    Social History   Socioeconomic History   Marital status: Married    Spouse name: Not on file   Number of children: Not on file   Years of education: Not on file   Highest education level: Not on file  Occupational History   Not on file  Tobacco Use   Smoking status: Former    Types: Cigarettes    Quit date: 1967    Years since quitting: 57.2   Smokeless tobacco: Never  Vaping Use   Vaping Use: Never used  Substance and Sexual Activity   Alcohol use: Yes    Comment: occasionally   Drug use: No   Sexual activity: Not Currently  Other Topics Concern   Not on file  Social History Narrative   Not on file   Social Determinants of Health  Financial Resource Strain: Not on file  Food Insecurity: Not on file  Transportation  Needs: Not on file  Physical Activity: Not on file  Stress: Not on file  Social Connections: Not on file  Intimate Partner Violence: Not on file      Review of Systems  Constitutional:  Negative for chills, fatigue and unexpected weight change.  HENT:  Negative for congestion, rhinorrhea, sneezing and sore throat.   Eyes:  Negative for redness.  Respiratory:  Negative for cough, chest tightness and shortness of breath.   Cardiovascular:  Negative for chest pain and palpitations.  Gastrointestinal:  Negative for abdominal pain, constipation, diarrhea, nausea and vomiting.  Genitourinary:  Negative for dysuria and frequency.  Musculoskeletal:  Positive for arthralgias and back pain. Negative for joint swelling and neck pain.  Skin:  Negative for rash.  Neurological: Negative.  Negative for tremors and numbness.  Hematological:  Negative for adenopathy. Does not bruise/bleed easily.  Psychiatric/Behavioral:  Positive for dysphoric mood. Negative for sleep disturbance and suicidal ideas. Behavioral problem: Depression.The patient is nervous/anxious.     Vital Signs: BP (!) 140/68 Comment: 139/94  Pulse 73   Temp 98.2 F (36.8 C)   Resp 16   Ht '5\' 1"'$  (1.549 m)   Wt 142 lb (64.4 kg)   SpO2 98%   BMI 26.83 kg/m    Physical Exam Vitals and nursing note reviewed.  Constitutional:      General: She is not in acute distress.    Appearance: She is well-developed. She is not diaphoretic.  HENT:     Head: Normocephalic and atraumatic.     Mouth/Throat:     Pharynx: No oropharyngeal exudate.  Eyes:     Pupils: Pupils are equal, round, and reactive to light.  Neck:     Thyroid: No thyromegaly.     Vascular: No JVD.     Trachea: No tracheal deviation.  Cardiovascular:     Rate and Rhythm: Normal rate and regular rhythm.     Heart sounds: Normal heart sounds. No murmur heard.    No friction rub. No gallop.  Pulmonary:     Effort: Pulmonary effort is normal. No respiratory  distress.     Breath sounds: No wheezing or rales.  Chest:     Chest wall: No tenderness.  Abdominal:     General: Bowel sounds are normal.     Palpations: Abdomen is soft.  Musculoskeletal:        General: Normal range of motion.     Cervical back: Normal range of motion and neck supple.  Lymphadenopathy:     Cervical: No cervical adenopathy.  Skin:    General: Skin is warm and dry.  Neurological:     Mental Status: She is alert and oriented to person, place, and time.     Cranial Nerves: No cranial nerve deficit.  Psychiatric:        Behavior: Behavior normal.        Thought Content: Thought content normal.        Judgment: Judgment normal.        Assessment/Plan: 1. Benign essential HTN Stable, continue current medication  2. Moderate episode of recurrent major depressive disorder (HCC) Continue celexa as before  3. Mixed hyperlipidemia Improving, continue current medications  4. Primary ovarian failure - DG Bone Density; Future  5. Allergic rhinitis due to pollen, unspecified seasonality - fluticasone (FLONASE) 50 MCG/ACT nasal spray; Use 2 sprays in each nostril daily,  Dispense: 16  g; Refill: 2   General Counseling: Chailyn verbalizes understanding of the findings of todays visit and agrees with plan of treatment. I have discussed any further diagnostic evaluation that may be needed or ordered today. We also reviewed her medications today. she has been encouraged to call the office with any questions or concerns that should arise related to todays visit.    Orders Placed This Encounter  Procedures   DG Bone Density    Meds ordered this encounter  Medications   fluticasone (FLONASE) 50 MCG/ACT nasal spray    Sig: Use 2 sprays in each nostril daily,    Dispense:  16 g    Refill:  2    This patient was seen by Drema Dallas, PA-C in collaboration with Dr. Clayborn Bigness as a part of collaborative care agreement.   Total time spent:30 Minutes Time  spent includes review of chart, medications, test results, and follow up plan with the patient.      Dr Lavera Guise Internal medicine

## 2022-10-16 ENCOUNTER — Other Ambulatory Visit: Payer: Self-pay | Admitting: Physician Assistant

## 2022-10-16 DIAGNOSIS — E782 Mixed hyperlipidemia: Secondary | ICD-10-CM

## 2022-10-21 DIAGNOSIS — M5416 Radiculopathy, lumbar region: Secondary | ICD-10-CM | POA: Diagnosis not present

## 2022-10-21 DIAGNOSIS — M48062 Spinal stenosis, lumbar region with neurogenic claudication: Secondary | ICD-10-CM | POA: Diagnosis not present

## 2022-11-16 DIAGNOSIS — M8588 Other specified disorders of bone density and structure, other site: Secondary | ICD-10-CM | POA: Diagnosis not present

## 2022-11-16 DIAGNOSIS — E2839 Other primary ovarian failure: Secondary | ICD-10-CM | POA: Diagnosis not present

## 2022-11-17 ENCOUNTER — Other Ambulatory Visit: Payer: Self-pay | Admitting: Family Medicine

## 2022-11-17 DIAGNOSIS — M48062 Spinal stenosis, lumbar region with neurogenic claudication: Secondary | ICD-10-CM | POA: Diagnosis not present

## 2022-11-17 DIAGNOSIS — M5136 Other intervertebral disc degeneration, lumbar region: Secondary | ICD-10-CM | POA: Diagnosis not present

## 2022-11-17 DIAGNOSIS — M5416 Radiculopathy, lumbar region: Secondary | ICD-10-CM | POA: Diagnosis not present

## 2022-11-17 DIAGNOSIS — M4807 Spinal stenosis, lumbosacral region: Secondary | ICD-10-CM | POA: Diagnosis not present

## 2022-11-30 ENCOUNTER — Encounter: Payer: Self-pay | Admitting: Physician Assistant

## 2022-11-30 ENCOUNTER — Ambulatory Visit (INDEPENDENT_AMBULATORY_CARE_PROVIDER_SITE_OTHER): Payer: Medicare Other | Admitting: Physician Assistant

## 2022-11-30 VITALS — BP 130/64 | HR 95 | Temp 98.0°F | Resp 16 | Ht 61.0 in | Wt 145.0 lb

## 2022-11-30 DIAGNOSIS — M8580 Other specified disorders of bone density and structure, unspecified site: Secondary | ICD-10-CM

## 2022-11-30 DIAGNOSIS — K219 Gastro-esophageal reflux disease without esophagitis: Secondary | ICD-10-CM

## 2022-11-30 DIAGNOSIS — F331 Major depressive disorder, recurrent, moderate: Secondary | ICD-10-CM | POA: Diagnosis not present

## 2022-11-30 DIAGNOSIS — J4520 Mild intermittent asthma, uncomplicated: Secondary | ICD-10-CM

## 2022-11-30 DIAGNOSIS — I10 Essential (primary) hypertension: Secondary | ICD-10-CM

## 2022-11-30 MED ORDER — CITALOPRAM HYDROBROMIDE 20 MG PO TABS: 20.0000 mg | ORAL_TABLET | Freq: Every day | ORAL | 1 refills | Status: DC

## 2022-11-30 MED ORDER — ALBUTEROL SULFATE HFA 108 (90 BASE) MCG/ACT IN AERS: 2.0000 | INHALATION_SPRAY | Freq: Four times a day (QID) | RESPIRATORY_TRACT | 0 refills | Status: DC | PRN

## 2022-11-30 MED ORDER — PANTOPRAZOLE SODIUM 40 MG PO TBEC: 40.0000 mg | DELAYED_RELEASE_TABLET | Freq: Every day | ORAL | 1 refills | Status: DC

## 2022-11-30 NOTE — Progress Notes (Signed)
[facility] [address] [place], [place] [address]  Internal MEDICINE  Office Visit Note  Patient Name: Vanessa Evans  [id number]  [id number]  Date of Service: 11/30/2022  Chief Complaint  Patient presents with   Follow-up    BMD   Hyperlipidemia   Hypertension    HPI Pt is here for routine follow up -Already on Evista through Endo due to previous fracture -BMD reviewed showing low bone mass -Supposed to have MRI of lumbar spine on Wed, they prescribed valium for this due to claustrophobia -Continues to have incontinence, feels like vagina is dropped some, told previously it was prolapsed. Had hysterectomy with bladder tack in 2017. She is considering scheduling follow up with GYN to re-evaluate -Has noticed a little more swelling in left leg than right, but minimal overall. May be due to back pain, will monitor and elevate legs as able -Some GI symptoms again, hx of diverticulitis, Going to adjust diet to soft/liquids and will contact her GI provider if not improving  Current Medication: Outpatient Encounter Medications as of 11/30/2022  Medication Sig   acetaminophen (TYLENOL) 500 MG tablet Take 1,000 mg by mouth 2 (two) times daily as needed for moderate pain.   aspirin EC 81 MG tablet Take 81 mg by mouth at bedtime.    BLACK ELDERBERRY PO Take 1 Dose by mouth 2 (two) times a day.   Calcium Carbonate-Vitamin D 600-400 MG-UNIT tablet Take 1 tablet by mouth 2 (two) times daily.   cholecalciferol (VITAMIN D) 1000 UNITS tablet Take 1,000 Units by mouth at bedtime.    cyanocobalamin 500 MCG tablet Take 500 mcg by mouth daily.   Dextran 70-Hypromellose (ARTIFICIAL TEARS) 0.1-0.3 % SOLN Apply 1-2 drops to eye daily.   diclofenac Sodium (VOLTAREN) 1 % GEL Apply 4 g topically 4 (four) times daily.   diltiazem (CARDIZEM CD) 120 MG 24 hr capsule Take 180 mg by mouth at bedtime.   diphenhydrAMINE (BENADRYL) 25 MG tablet Take 25-50 mg by mouth daily as needed for itching.    docusate sodium (COLACE) 100 MG capsule Take 200 mg by mouth at bedtime as needed for moderate constipation.    EPINEPHrine (EPIPEN 2-PAK) 0.3 mg/0.3 mL IJ SOAJ injection use as directed for severe allergy reaction   fenofibrate 160 MG tablet TAKE 1 TABLET DAILY FOR CHOLESTEROL   fluticasone (FLONASE) 50 MCG/ACT nasal spray Use 2 sprays in each nostril daily,   folic acid (FOLVITE) 400 MCG tablet Take 400 mcg by mouth daily.   guaiFENesin (MUCINEX) 600 MG 12 hr tablet Take 600 mg by mouth at bedtime as needed (congestion).   Hypromellose (ARTIFICIAL TEARS OP) Place 1-2 drops into both eyes daily as needed (dry eyes).   ketotifen (ZADITOR) 0.025 % ophthalmic solution Place 1 drop into both eyes 2 (two) times daily as needed (allergies).   levocetirizine (XYZAL) 5 MG tablet Take 1 tablet (5 mg total) by mouth every evening.   levothyroxine (SYNTHROID, LEVOTHROID) 50 MCG tablet Take 50 mcg by mouth daily before breakfast. Brand Name only   losartan (COZAAR) 25 MG tablet Take 25 mg by mouth daily.   Magnesium 250 MG TABS Take 1 tablet by mouth daily.   Melatonin 5 MG TABS Take 1 tablet by mouth at bedtime. For sleep   metoprolol tartrate (LOPRESSOR) 25 MG tablet Take 12.5 mg by mouth daily.   Multiple Vitamins-Minerals (MULTIVITAMIN WITH MINERALS) tablet Take 1 tablet by mouth daily.   OXYGEN Inhale into the lungs. 2 litre  at night   pyridoxine (B-6) 100 MG tablet Take 100 mg by mouth daily.   raloxifene (EVISTA) 60 MG tablet Take 60 mg by mouth daily.   rosuvastatin (CRESTOR) 5 MG tablet Take 1 tablet (5 mg total) by mouth daily.   sodium chloride (OCEAN) 0.65 % SOLN nasal spray Place 1 spray into both nostrils 2 (two) times daily.   sucralfate (CARAFATE) 1 g tablet Take 3 times daily with meals.   Vitamin A 7.5 MG (25000 UT) CAPS Take 1 tablet by mouth 2 (two) times daily.   zinc gluconate 50 MG tablet Take 50 mg by mouth daily.   [DISCONTINUED] albuterol (PROVENTIL) (2.5 MG/3ML) 0.083%  nebulizer solution Take 3 mLs (2.5 mg total) by nebulization every 6 (six) hours as needed for wheezing or shortness of breath.   [DISCONTINUED] albuterol (VENTOLIN HFA) 108 (90 Base) MCG/ACT inhaler Inhale 2 puffs into the lungs every 6 (six) hours as needed for wheezing or shortness of breath.   [DISCONTINUED] citalopram (CELEXA) 20 MG tablet Take 1 tablet (20 mg total) by mouth daily.   [DISCONTINUED] pantoprazole (PROTONIX) 40 MG tablet Take 1 tablet (40 mg total) by mouth 2 (two) times daily.   albuterol (VENTOLIN HFA) 108 (90 Base) MCG/ACT inhaler Inhale 2 puffs into the lungs every 6 (six) hours as needed for wheezing or shortness of breath.   citalopram (CELEXA) 20 MG tablet Take 1 tablet (20 mg total) by mouth daily.   pantoprazole (PROTONIX) 40 MG tablet Take 1 tablet (40 mg total) by mouth daily.   No facility-administered encounter medications on file as of 11/30/2022.    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 cope.  all benign.  inflammatory process going on in bladder   BREAST BIOPSY Right    benign   BREAST SURGERY Right    lumpectomy   CATARACT EXTRACTION W/PHACO Left 12/25/2014   Procedure: CATARACT EXTRACTION PHACO AND INTRAOCULAR LENS PLACEMENT (IOC);  Surgeon: Galen Manila, MD;  Location: [facility] ORS;  Service: Ophthalmology;  Laterality: Left;  [place] 00:32 AP% 20.0 CDE 6.49   COLONOSCOPY W/ BIOPSIES     COLONOSCOPY W/ POLYPECTOMY  2002, 2004, 2005   adenomatous polyps removed   COLONOSCOPY WITH PROPOFOL N/A 08/23/2017   Procedure: COLONOSCOPY WITH PROPOFOL;  Surgeon: Scot Jun, MD;  Location: [facility] ENDOSCOPY;  Service: Endoscopy;  Laterality: N/A;   COLONOSCOPY WITH PROPOFOL N/A 11/08/2017   Procedure: COLONOSCOPY WITH PROPOFOL;  Surgeon: Scot Jun, MD;   Location: [facility] ENDOSCOPY;  Service: Endoscopy;  Laterality: N/A;   COLONOSCOPY WITH PROPOFOL N/A 06/30/2021   Procedure: COLONOSCOPY WITH PROPOFOL;  Surgeon: Regis Bill, MD;  Location: [facility] ENDOSCOPY;  Service: Endoscopy;  Laterality: N/A;   CYSTOCELE REPAIR N/A 04/09/2016   Procedure: ANTERIOR REPAIR (CYSTOCELE);  Surgeon: Nadara Mustard, MD;  Location: [facility] ORS;  Service: Gynecology;  Laterality: N/A;   DIAGNOSTIC LAPAROSCOPY  2008   removed both ovaries with cysts, tubes and fibroids   DILATION AND CURETTAGE OF UTERUS  1973   ESOPHAGOGASTRODUODENOSCOPY (EGD) WITH PROPOFOL N/A 08/23/2017   Procedure: ESOPHAGOGASTRODUODENOSCOPY (EGD) WITH PROPOFOL;  Surgeon: Scot Jun, MD;  Location: [facility] ENDOSCOPY;  Service: Endoscopy;  Laterality: N/A;   ESOPHAGOGASTRODUODENOSCOPY (EGD) WITH PROPOFOL N/A 06/30/2021  Procedure: ESOPHAGOGASTRODUODENOSCOPY (EGD) WITH PROPOFOL;  Surgeon: Regis Bill, MD;  Location: [facility] ENDOSCOPY;  Service: Endoscopy;  Laterality: N/A;   EYE SURGERY Bilateral 2016   HERNIA REPAIR Right 2008   Inguinal hernia Repair, ventral hernia repair   IR KYPHO THORACIC WITH BONE BIOPSY  05/12/2022   IR RADIOLOGIST EVAL & MGMT  05/19/2022   IR RADIOLOGIST EVAL & MGMT  05/26/2022   JOINT REPLACEMENT Right 2008   knee   KNEE ARTHROSCOPY Right    LUMBAR LAMINECTOMY/DECOMPRESSION MICRODISCECTOMY Right 03/21/2013   Procedure: Right Lumbar five-Sacral one Laminectomy for Synovial Cyst;  Surgeon: Reinaldo Meeker, MD;  Location: [facility];  Service: Neurosurgery;  Laterality: Right;  right   OOPHORECTOMY     REVERSE SHOULDER ARTHROPLASTY Right 02/22/2018   Procedure: REVERSE SHOULDER ARTHROPLASTY;  Surgeon: Christena Flake, MD;  Location: [facility] ORS;  Service: Orthopedics;  Laterality: Right;   TUBAL LIGATION     UNILATERAL SALPINGECTOMY Left 04/09/2016   Procedure: UNILATERAL SALPINGECTOMY;  Surgeon: Nadara Mustard, MD;  Location: [facility] ORS;  Service: Gynecology;   Laterality: Left;   UPPER GI ENDOSCOPY  2010   with biopsy of gastric erosion   VAGINAL HYSTERECTOMY N/A 04/09/2016   Procedure: HYSTERECTOMY VAGINAL;  Surgeon: Nadara Mustard, MD;  Location: [facility] ORS;  Service: Gynecology;  Laterality: N/A;   VAGINAL HYSTERECTOMY  2017   and bladder tack    Medical History: Past Medical History:  Diagnosis Date   Arthritis    osteo   Asthma    needs rescue inhaler few times a year   Claustrophobia 04/21/2020   Depression    Dizziness    light headed spells but it has been a while   Dysrhythmia    tachycardia.(cardizem). brady during procedures   Fibromyalgia    GERD (gastroesophageal reflux disease)    gastritis   Headache(784.0)    Heart murmur 2019   aortic. dr. Gwen Pounds not worried about this   Hyperlipidemia    Hypertension    Hypothyroidism    Macular degeneration    Migraine    Orthopnea    Pneumonia    long time ago (greater than 5 years ago)   PONV (postoperative nausea and vomiting)    very anxious during cataract surgery. brady during colonoscopy   Shortness of breath    any exertion, cannot lie flat    Family History: Family History  Problem Relation Age of Onset   Pulmonary fibrosis Mother    Melanoma Father    COPD Sister    Cardiomyopathy Sister        and arrhythmia   Atrial fibrillation Sister    COPD Brother    Pulmonary fibrosis Brother    Cancer Brother    Breast cancer Cousin        paternal 1st    Social History   Socioeconomic History   Marital status: Married    Spouse name: Not on file   Number of children: Not on file   Years of education: Not on file   Highest education level: Not on file  Occupational History   Not on file  Tobacco Use   Smoking status: Former    Types: Cigarettes    Quit date: 1967    Years since quitting: 57.3   Smokeless tobacco: Never  Vaping Use   Vaping Use: Never used  Substance and Sexual Activity   Alcohol use: Yes    Comment: occasionally   Drug use:  No  Sexual activity: Not Currently  Other Topics Concern   Not on file  Social History Narrative   Not on file   Social Determinants of Health   Financial Resource Strain: Not on file  Food Insecurity: Not on file  Transportation Needs: Not on file  Physical Activity: Not on file  Stress: Not on file  Social Connections: Not on file  Intimate Partner Violence: Not on file      Review of Systems  Constitutional:  Negative for chills, fatigue and unexpected weight change.  HENT:  Negative for congestion, rhinorrhea, sneezing and sore throat.   Eyes:  Negative for redness.  Respiratory:  Negative for cough, chest tightness and shortness of breath.   Cardiovascular:  Negative for chest pain and palpitations.  Gastrointestinal:  Positive for abdominal pain and nausea.  Genitourinary:  Negative for dysuria and frequency.  Musculoskeletal:  Positive for arthralgias and back pain. Negative for joint swelling and neck pain.  Skin:  Negative for rash.  Neurological: Negative.  Negative for tremors and numbness.  Hematological:  Negative for adenopathy. Does not bruise/bleed easily.  Psychiatric/Behavioral:  Positive for dysphoric mood. Negative for sleep disturbance and suicidal ideas. Behavioral problem: Depression.The patient is nervous/anxious.     Vital Signs: BP 130/64 Comment: 140/70  Pulse 95   Temp 98 F (36.7 C)   Resp 16   Ht 5\' 1"  (1.549 m)   Wt 145 lb (65.8 kg)   SpO2 95%   BMI 27.40 kg/m    Physical Exam Vitals and nursing note reviewed.  Constitutional:      General: She is not in acute distress.    Appearance: She is well-developed. She is not diaphoretic.  HENT:     Head: Normocephalic and atraumatic.     Mouth/Throat:     Pharynx: No oropharyngeal exudate.  Eyes:     Pupils: Pupils are equal, round, and reactive to light.  Neck:     Thyroid: No thyromegaly.     Vascular: No JVD.     Trachea: No tracheal deviation.  Cardiovascular:     Rate and  Rhythm: Normal rate and regular rhythm.     Heart sounds: Normal heart sounds. No murmur heard.    No friction rub. No gallop.  Pulmonary:     Effort: Pulmonary effort is normal. No respiratory distress.     Breath sounds: No wheezing or rales.  Chest:     Chest wall: No tenderness.  Abdominal:     General: Bowel sounds are normal.     Palpations: Abdomen is soft.  Musculoskeletal:     Cervical back: Normal range of motion and neck supple.  Lymphadenopathy:     Cervical: No cervical adenopathy.  Skin:    General: Skin is warm and dry.  Neurological:     Mental Status: She is alert and oriented to person, place, and time.     Cranial Nerves: No cranial nerve deficit.  Psychiatric:        Behavior: Behavior normal.        Thought Content: Thought content normal.        Judgment: Judgment normal.        Assessment/Plan: 1. Moderate episode of recurrent major depressive disorder Continue celexa as before - citalopram (CELEXA) 20 MG tablet; Take 1 tablet (20 mg total) by mouth daily.  Dispense: 90 tablet; Refill: 1  2. Benign essential HTN Stable, continue current medications  3. Gastroesophageal reflux disease without esophagitis - pantoprazole (PROTONIX) 40  MG tablet; Take 1 tablet (40 mg total) by mouth daily.  Dispense: 90 tablet; Refill: 1  4. Chronic asthma, mild intermittent, uncomplicated - albuterol (VENTOLIN HFA) 108 (90 Base) MCG/ACT inhaler; Inhale 2 puffs into the lungs every 6 (six) hours as needed for wheezing or shortness of breath.  Dispense: 8 g; Refill: 0  5. Low bone mass Continue Evista as before   General Counseling: Story verbalizes understanding of the findings of todays visit and agrees with plan of treatment. I have discussed any further diagnostic evaluation that may be needed or ordered today. We also reviewed her medications today. she has been encouraged to call the office with any questions or concerns that should arise related to todays  visit.    No orders of the defined types were placed in this encounter.   Meds ordered this encounter  Medications   citalopram (CELEXA) 20 MG tablet    Sig: Take 1 tablet (20 mg total) by mouth daily.    Dispense:  90 tablet    Refill:  1   pantoprazole (PROTONIX) 40 MG tablet    Sig: Take 1 tablet (40 mg total) by mouth daily.    Dispense:  90 tablet    Refill:  1   albuterol (VENTOLIN HFA) 108 (90 Base) MCG/ACT inhaler    Sig: Inhale 2 puffs into the lungs every 6 (six) hours as needed for wheezing or shortness of breath.    Dispense:  8 g    Refill:  0    This patient was seen by Lynn Ito, PA-C in collaboration with Dr. Beverely Risen as a part of collaborative care agreement.   Total time spent:30 Minutes Time spent includes review of chart, medications, test results, and follow up plan with the patient.      Dr Lyndon Code Internal medicine

## 2022-12-02 ENCOUNTER — Ambulatory Visit
Admission: RE | Admit: 2022-12-02 | Discharge: 2022-12-02 | Disposition: A | Discharge status: Home / Self Care | Payer: Medicare Other | Source: Ambulatory Visit | Attending: Family Medicine | Admitting: Family Medicine

## 2022-12-02 DIAGNOSIS — M5416 Radiculopathy, lumbar region: Secondary | ICD-10-CM

## 2022-12-02 DIAGNOSIS — M545 Low back pain, unspecified: Secondary | ICD-10-CM | POA: Diagnosis not present

## 2022-12-09 DIAGNOSIS — M5416 Radiculopathy, lumbar region: Secondary | ICD-10-CM | POA: Diagnosis not present

## 2022-12-09 DIAGNOSIS — M5136 Other intervertebral disc degeneration, lumbar region: Secondary | ICD-10-CM | POA: Diagnosis not present

## 2022-12-10 ENCOUNTER — Other Ambulatory Visit: Payer: Self-pay | Admitting: Physician Assistant

## 2022-12-10 DIAGNOSIS — J301 Allergic rhinitis due to pollen: Secondary | ICD-10-CM

## 2022-12-16 DIAGNOSIS — M5416 Radiculopathy, lumbar region: Secondary | ICD-10-CM | POA: Diagnosis not present

## 2022-12-16 DIAGNOSIS — M48062 Spinal stenosis, lumbar region with neurogenic claudication: Secondary | ICD-10-CM | POA: Diagnosis not present

## 2022-12-23 ENCOUNTER — Encounter: Payer: Self-pay | Admitting: Nurse Practitioner

## 2022-12-23 ENCOUNTER — Ambulatory Visit (INDEPENDENT_AMBULATORY_CARE_PROVIDER_SITE_OTHER): Payer: Medicare Other | Admitting: Nurse Practitioner

## 2022-12-23 VITALS — BP 130/68 | HR 93 | Temp 97.6°F | Resp 16 | Ht 61.0 in | Wt 143.6 lb

## 2022-12-23 DIAGNOSIS — J0100 Acute maxillary sinusitis, unspecified: Secondary | ICD-10-CM | POA: Diagnosis not present

## 2022-12-23 MED ORDER — AMOXICILLIN-POT CLAVULANATE 875-125 MG PO TABS: 1.0000 | ORAL_TABLET | Freq: Two times a day (BID) | ORAL | 0 refills | Status: AC

## 2022-12-23 NOTE — Progress Notes (Signed)
[facility] [address] [place], [place] [address]  Internal MEDICINE  Office Visit Note  Patient Name: Vanessa Evans  [id number]  [id number]  Date of Service: 12/23/2022  Chief Complaint  Patient presents with   Acute Visit    Ear ache     HPI [name] presents for an acute sick visit for symptoms of sinus infection --reports sinus pressure, light headed, left ear pain and tenderness, headache, postnasal drip and ear ache Denies sore throat, runny nose or nasal congestion, cough  Negative for covid    Current Medication:  Outpatient Encounter Medications as of 12/23/2022  Medication Sig   acetaminophen (TYLENOL) 500 MG tablet Take 1,000 mg by mouth 2 (two) times daily as needed for moderate pain.   albuterol (VENTOLIN HFA) 108 (90 Base) MCG/ACT inhaler Inhale 2 puffs into the lungs every 6 (six) hours as needed for wheezing or shortness of breath.   amoxicillin-clavulanate (AUGMENTIN) 875-125 MG tablet Take 1 tablet by mouth 2 (two) times daily for 10 days. Take with food   aspirin EC 81 MG tablet Take 81 mg by mouth at bedtime.    BLACK ELDERBERRY PO Take 1 Dose by mouth 2 (two) times a day.   Calcium Carbonate-Vitamin D 600-400 MG-UNIT tablet Take 1 tablet by mouth 2 (two) times daily.   cholecalciferol (VITAMIN D) 1000 UNITS tablet Take 1,000 Units by mouth at bedtime.    citalopram (CELEXA) 20 MG tablet Take 1 tablet (20 mg total) by mouth daily.   cyanocobalamin 500 MCG tablet Take 500 mcg by mouth daily.   Dextran 70-Hypromellose (ARTIFICIAL TEARS) 0.1-0.3 % SOLN Apply 1-2 drops to eye daily.   diclofenac Sodium (VOLTAREN) 1 % GEL Apply 4 g topically 4 (four) times daily.   diltiazem (CARDIZEM CD) 120 MG 24 hr capsule Take 180 mg by mouth at bedtime.   diphenhydrAMINE (BENADRYL) 25 MG tablet Take 25-50 mg by mouth daily as needed for itching.   docusate sodium (COLACE) 100 MG capsule Take 200 mg by mouth at bedtime as needed for moderate constipation.     EPINEPHrine (EPIPEN 2-PAK) 0.3 mg/0.3 mL IJ SOAJ injection use as directed for severe allergy reaction   fenofibrate 160 MG tablet TAKE 1 TABLET DAILY FOR CHOLESTEROL   fluticasone (FLONASE) 50 MCG/ACT nasal spray Use 2 sprays in each nostril daily,   folic acid (FOLVITE) 400 MCG tablet Take 400 mcg by mouth daily.   guaiFENesin (MUCINEX) 600 MG 12 hr tablet Take 600 mg by mouth at bedtime as needed (congestion).   Hypromellose (ARTIFICIAL TEARS OP) Place 1-2 drops into both eyes daily as needed (dry eyes).   ketotifen (ZADITOR) 0.025 % ophthalmic solution Place 1 drop into both eyes 2 (two) times daily as needed (allergies).   levocetirizine (XYZAL) 5 MG tablet Take 1 tablet (5 mg total) by mouth every evening.   levothyroxine (SYNTHROID, LEVOTHROID) 50 MCG tablet Take 50 mcg by mouth daily before breakfast. Brand Name only   losartan (COZAAR) 25 MG tablet Take 25 mg by mouth daily.   Magnesium 250 MG TABS Take 1 tablet by mouth daily.   Melatonin 5 MG TABS Take 1 tablet by mouth at bedtime. For sleep   metoprolol tartrate (LOPRESSOR) 25 MG tablet Take 12.5 mg by mouth daily.   Multiple Vitamins-Minerals (MULTIVITAMIN WITH MINERALS) tablet Take 1 tablet by mouth daily.   OXYGEN Inhale into the lungs. 2 litre at night   pantoprazole (PROTONIX) 40 MG tablet Take 1 tablet (40  mg total) by mouth daily.   pyridoxine (B-6) 100 MG tablet Take 100 mg by mouth daily.   raloxifene (EVISTA) 60 MG tablet Take 60 mg by mouth daily.   rosuvastatin (CRESTOR) 5 MG tablet Take 1 tablet (5 mg total) by mouth daily.   sodium chloride (OCEAN) 0.65 % SOLN nasal spray Place 1 spray into both nostrils 2 (two) times daily.   sucralfate (CARAFATE) 1 g tablet Take 3 times daily with meals.   Vitamin A 7.5 MG (25000 UT) CAPS Take 1 tablet by mouth 2 (two) times daily.   zinc gluconate 50 MG tablet Take 50 mg by mouth daily.   No facility-administered encounter medications on file as of 12/23/2022.      Medical  History: Past Medical History:  Diagnosis Date   Arthritis    osteo   Asthma    needs rescue inhaler few times a year   Claustrophobia 04/21/2020   Depression    Dizziness    light headed spells but it has been a while   Dysrhythmia    tachycardia.(cardizem). brady during procedures   Fibromyalgia    GERD (gastroesophageal reflux disease)    gastritis   Headache(784.0)    Heart murmur 2019   aortic. dr. Gwen Pounds not worried about this   Hyperlipidemia    Hypertension    Hypothyroidism    Macular degeneration    Migraine    Orthopnea    Pneumonia    long time ago (greater than 5 years ago)   PONV (postoperative nausea and vomiting)    very anxious during cataract surgery. brady during colonoscopy   Shortness of breath    any exertion, cannot lie flat     Vital Signs: BP 130/68   Pulse 93   Temp 97.6 F (36.4 C)   Resp 16   Ht 5\' 1"  (1.549 m)   Wt 143 lb 9.6 oz (65.1 kg)   SpO2 95%   BMI 27.13 kg/m    Review of Systems  Constitutional:  Positive for fatigue. Negative for chills and fever.  HENT:  Positive for ear pain, postnasal drip, sinus pressure and sinus pain. Negative for congestion, rhinorrhea, sneezing and sore throat.   Respiratory:  Negative for cough, chest tightness, shortness of breath and wheezing.   Cardiovascular: Negative.  Negative for chest pain and palpitations.  Musculoskeletal:  Negative for myalgias.  Neurological:  Positive for headaches.    Physical Exam Vitals reviewed.  Constitutional:      General: She is not in acute distress.    Appearance: Normal appearance. She is not ill-appearing.  HENT:     Head: Normocephalic and atraumatic.     Right Ear: Tympanic membrane, ear canal and external ear normal.     Left Ear: Tympanic membrane, ear canal and external ear normal.     Nose: Mucosal edema present. No congestion or rhinorrhea.     Right Turbinates: Swollen and pale.     Left Turbinates: Swollen and pale.     Right Sinus:  Maxillary sinus tenderness and frontal sinus tenderness present.     Left Sinus: Maxillary sinus tenderness and frontal sinus tenderness present.     Mouth/Throat:     Mouth: Mucous membranes are moist.     Pharynx: Posterior oropharyngeal erythema present.  Eyes:     Pupils: Pupils are equal, round, and reactive to light.  Cardiovascular:     Rate and Rhythm: Normal rate and regular rhythm.     Heart  sounds: Normal heart sounds. No murmur heard. Pulmonary:     Effort: Pulmonary effort is normal. No respiratory distress.     Breath sounds: Normal breath sounds. No wheezing.  Lymphadenopathy:     Cervical: Cervical adenopathy present.     Left cervical: Superficial cervical adenopathy present.  Neurological:     Mental Status: She is alert and oriented to person, place, and time.  Psychiatric:        Mood and Affect: Mood normal.        Behavior: Behavior normal.       Assessment/Plan: 1. Acute non-recurrent maxillary sinusitis Empiric antibiotic treatment with augmentin  - amoxicillin-clavulanate (AUGMENTIN) 875-125 MG tablet; Take 1 tablet by mouth 2 (two) times daily for 10 days. Take with food  Dispense: 20 tablet; Refill: 0   General Counseling: [place] verbalizes understanding of the findings of todays visit and agrees with plan of treatment. I have discussed any further diagnostic evaluation that may be needed or ordered today. We also reviewed her medications today. she has been encouraged to call the office with any questions or concerns that should arise related to todays visit.    Counseling:    No orders of the defined types were placed in this encounter.   Meds ordered this encounter  Medications   amoxicillin-clavulanate (AUGMENTIN) 875-125 MG tablet    Sig: Take 1 tablet by mouth 2 (two) times daily for 10 days. Take with food    Dispense:  20 tablet    Refill:  0    Return if symptoms worsen or fail to improve.  [place] Controlled Substance Database was  reviewed by me for overdose risk score (ORS)  Time spent:20 Minutes Time spent with patient included reviewing progress notes, labs, imaging studies, and discussing plan for follow up.   This patient was seen by Sallyanne Kuster, FNP-C in collaboration with Dr. Beverely Risen as a part of collaborative care agreement.  Marzetta Lanza R. Tedd Sias, MSN, FNP-C Internal Medicine

## 2023-01-07 DIAGNOSIS — M1612 Unilateral primary osteoarthritis, left hip: Secondary | ICD-10-CM | POA: Diagnosis not present

## 2023-01-07 DIAGNOSIS — M546 Pain in thoracic spine: Secondary | ICD-10-CM | POA: Diagnosis not present

## 2023-01-07 DIAGNOSIS — M5134 Other intervertebral disc degeneration, thoracic region: Secondary | ICD-10-CM | POA: Diagnosis not present

## 2023-01-08 DIAGNOSIS — M1612 Unilateral primary osteoarthritis, left hip: Secondary | ICD-10-CM | POA: Diagnosis not present

## 2023-01-11 DIAGNOSIS — H353211 Exudative age-related macular degeneration, right eye, with active choroidal neovascularization: Secondary | ICD-10-CM | POA: Diagnosis not present

## 2023-01-27 ENCOUNTER — Other Ambulatory Visit: Payer: Self-pay | Admitting: Physician Assistant

## 2023-01-27 DIAGNOSIS — E782 Mixed hyperlipidemia: Secondary | ICD-10-CM

## 2023-01-29 DIAGNOSIS — M1612 Unilateral primary osteoarthritis, left hip: Secondary | ICD-10-CM | POA: Diagnosis not present

## 2023-01-29 DIAGNOSIS — M5416 Radiculopathy, lumbar region: Secondary | ICD-10-CM | POA: Diagnosis not present

## 2023-01-29 DIAGNOSIS — M5136 Other intervertebral disc degeneration, lumbar region: Secondary | ICD-10-CM | POA: Diagnosis not present

## 2023-02-04 ENCOUNTER — Other Ambulatory Visit: Payer: Self-pay | Admitting: Physician Assistant

## 2023-02-04 DIAGNOSIS — J301 Allergic rhinitis due to pollen: Secondary | ICD-10-CM

## 2023-03-06 ENCOUNTER — Other Ambulatory Visit: Payer: Self-pay

## 2023-03-06 ENCOUNTER — Emergency Department
Admission: EM | Admit: 2023-03-06 | Discharge: 2023-03-06 | Disposition: A | Discharge status: Home / Self Care | Payer: Medicare Other | Source: Home / Self Care | Attending: Emergency Medicine | Admitting: Emergency Medicine

## 2023-03-06 ENCOUNTER — Emergency Department: Payer: Medicare Other

## 2023-03-06 DIAGNOSIS — M48061 Spinal stenosis, lumbar region without neurogenic claudication: Secondary | ICD-10-CM | POA: Diagnosis not present

## 2023-03-06 DIAGNOSIS — M4317 Spondylolisthesis, lumbosacral region: Secondary | ICD-10-CM | POA: Diagnosis not present

## 2023-03-06 DIAGNOSIS — M545 Low back pain, unspecified: Secondary | ICD-10-CM | POA: Diagnosis present

## 2023-03-06 DIAGNOSIS — M47816 Spondylosis without myelopathy or radiculopathy, lumbar region: Secondary | ICD-10-CM | POA: Diagnosis not present

## 2023-03-06 DIAGNOSIS — M4856XD Collapsed vertebra, not elsewhere classified, lumbar region, subsequent encounter for fracture with routine healing: Secondary | ICD-10-CM | POA: Diagnosis not present

## 2023-03-06 MED ORDER — PREDNISONE 10 MG (21) PO TBPK: ORAL_TABLET | ORAL | 0 refills | Status: DC

## 2023-03-06 MED ORDER — HYDROCODONE-ACETAMINOPHEN 5-325 MG PO TABS: 1.0000 | ORAL_TABLET | Freq: Four times a day (QID) | ORAL | 0 refills | Status: DC | PRN

## 2023-03-06 MED ORDER — HYDROCODONE-ACETAMINOPHEN 5-325 MG PO TABS
1.0000 | ORAL_TABLET | Freq: Once | ORAL | Status: AC
  Administered 2023-03-06: 1 via ORAL
  Filled 2023-03-06: qty 1

## 2023-03-06 NOTE — Discharge Instructions (Signed)
Please seek medical attention for any difficulty with urination or defecation, worsening pain, fevers, or any other new or concerning symptoms.

## 2023-03-06 NOTE — ED Triage Notes (Signed)
Pt reports woke up this am and had trouble walking to the bathroom due to pain. Pt reports took a half a pain pill this am. Pt reports hx of the same and ended up with a compression fracture. Pt would like a CT scan verus an x-ray. Denies falls or other injuries.

## 2023-03-06 NOTE — ED Provider Notes (Signed)
[facility] Provider Note    Event Date/Time   First MD Initiated Contact with Patient 03/06/23 1509     (approximate)   History   Back Pain   HPI  Vanessa Evans is a 77 y.o. female  who presents to the emergency department today because of concern for back pain. Patient has history of compression fracture in her spine and is worried that she might have another one. She says she woke up this morning with severe pain. Located in the lower back it does radiate down her right leg. She says it has been a long time since she has had pain radiate down her leg. The patient denies any trauma or unusual activity yesterday. She denies any difficulty with urination or defecation.       Physical Exam   Triage Vital Signs: ED Triage Vitals  Encounter Vitals Group     BP 03/06/23 1321 (!) 143/76     Systolic BP Percentile --      Diastolic BP Percentile --      Pulse Rate 03/06/23 1321 99     Resp 03/06/23 1321 18     Temp 03/06/23 1321 97.9 F (36.6 C)     Temp Source 03/06/23 1321 Oral     SpO2 03/06/23 1321 100 %     Weight 03/06/23 1319 144 lb (65.3 kg)     Height 03/06/23 1319 5\' 1"  (1.549 m)     Head Circumference --      Peak Flow --      Pain Score 03/06/23 1319 10     Pain Loc --      Pain Education --      Exclude from Growth Chart --     Most recent vital signs: Vitals:   03/06/23 1321  BP: (!) 143/76  Pulse: 99  Resp: 18  Temp: 97.9 F (36.6 C)  SpO2: 100%   General: Awake, alert, oriented. CV:  Good peripheral perfusion.  Resp:  Normal effort.  Abd:  No distention.  Other:  No midline lumbar spine tenderness.    ED Results / Procedures / Treatments   Labs (all labs ordered are listed, but only abnormal results are displayed) Labs Reviewed - No data to display   EKG  None   RADIOLOGY I independently interpreted and visualized the CT lumbar. My interpretation: no acute fracture Radiology interpretation:   IMPRESSION:  1. Chronic T12 compression fracture and prior vertebral  augmentation. No acute fractures.  2. Stable multilevel lumbar spondylosis greatest at L3-4 L4-5.  3. Stable L5 spondylolysis with grade 1 anterolisthesis of L5 on S1.      PROCEDURES:  Critical Care performed: No    MEDICATIONS ORDERED IN ED: Medications - No data to display   IMPRESSION / MDM / ASSESSMENT AND PLAN / ED COURSE  I reviewed the triage vital signs and the nursing notes.                              Differential diagnosis includes, but is not limited to, compression fracture, disc disease, cauda equina  Patient's presentation is most consistent with acute presentation with potential threat to life or bodily function.  Patient presented to the emergency department today because of concerns for low back pain.  Patient does have a history of compression fracture.  CT scan today does not show any acute fracture.  Patient also describes some pain  going down her right leg.  At this time I think patient likely suffering from sciatic likely disc disease.  Discussed this with the patient.  At this time I have low concern for cauda equina.  Will plan on giving patient prescription for steroids and pain medication.  Will have patient follow-up with her neurosurgeon.     FINAL CLINICAL IMPRESSION(S) / ED DIAGNOSES   Final diagnoses:  Low back pain, unspecified back pain laterality, unspecified chronicity, unspecified whether sciatica present      Note:  This document was prepared using Dragon voice recognition software and may include unintentional dictation errors.    Phineas Semen, MD 03/06/23 [phone]

## 2023-03-09 ENCOUNTER — Other Ambulatory Visit: Payer: Self-pay | Admitting: Family Medicine

## 2023-03-09 ENCOUNTER — Telehealth: Payer: Self-pay | Admitting: Physician Assistant

## 2023-03-09 DIAGNOSIS — M5136 Other intervertebral disc degeneration, lumbar region: Secondary | ICD-10-CM | POA: Diagnosis not present

## 2023-03-09 DIAGNOSIS — M5416 Radiculopathy, lumbar region: Secondary | ICD-10-CM

## 2023-03-09 NOTE — Telephone Encounter (Signed)
Patient declined ED follow up-Vanessa Evans

## 2023-03-15 ENCOUNTER — Telehealth: Payer: Self-pay

## 2023-03-15 ENCOUNTER — Telehealth: Payer: Self-pay | Admitting: Nurse Practitioner

## 2023-03-15 NOTE — Telephone Encounter (Signed)
Transition Care Management Unsuccessful Follow-up Telephone Call  Date of discharge and from where:  [facility] 7/27  Attempts:  1st Attempt  Reason for unsuccessful TCM follow-up call:  No answer/busy   Lenard Forth [facility] Guide, [facility] [phone] [address] [place], [place], [place] [address] Phone: [phone] Email: [email or web address]

## 2023-03-15 NOTE — Progress Notes (Unsigned)
Referring Physician:  Carlean Jews, PA-C [address] [place],  [place] [address]  Primary Physician:  Carlean Jews, PA-C  History of Present Illness: 03/15/2023 [name] is here today with a chief complaint of ***  Low back pain that radiates into the right leg?   Duration: *** 2 years  Location: *** Quality: *** dull, throbbing, aching Severity: *** 10/10  Precipitating: aggravated by ***bending and lying in bed.  Modifying factors: made better by ***nothing? Weakness: none Timing: ***intermittent  Bowel/Bladder Dysfunction: none  Conservative measures:  Physical therapy: *** she has participated in physical therapy at [facility] clinic in 2020 with mild benefit.  Multimodal medical therapy including regular antiinflammatories: ***tylenol, robaxin, hydrocodone, prednisone, voltaren gel  Injections: *** has received epidural steroid injections 01/08/2023: Left hip joint injection (50% relief) 12/16/2022: Bilateral L5-S1 transforaminal ESI (good relief x 1 week, then some return of discomfort, dexamethasone 14 mg) 10/21/2022: Bilateral S1 transforaminal ESI (no relief, dexamethasone 12 mg) 09/16/2022: RFA to the bilateral L4-5 and L5-S1 facet joints (no relief) 08/26/2022: MBB to the bilateral L4-5 and L5-S1 facet joints (10/10 to 2/10) 08/12/2022: MBB to the bilateral L4-5 and L5-S1 facet joints (10/10 to 2/10) 02/09/2021: Left subacromial injection by Micah Noel 06/15/2022: Bilateral L5-S1 transforaminal ESI (no relief) 05/12/2022: T12 kyphoplasty 03/11/2021: Right L5-S1 transforaminal ESI (70% relief) 02/21/2021: Left trochanteric bursa injection  01/14/2021: Right L5-S1 and right L4-5 transforaminal ESI (mild relief of lower extremity pain in the lower extremity) 05/10/2020: MBB to the bilateral L4-5 and L5-S1 facet joints (9/10 to 2/10, patient states that she had continued moderate to severe pain while at home with activity) 04/22/2020: MBB to the  bilateral L4-5 and L5-S1 facet joints (7/10 to 2/10) 10/03/2018: Left trochanteric bursa injection (moderate relief) 05/10/2018: Left trochanteric bursa injection performed by Hamilton Capri (good relief)   Past Surgery: *** 03/21/2013:  Right L5-S1 Laminectomy for Synovial Cyst by Dr. Derryl Harbor has ***no symptoms of cervical myelopathy.  The symptoms are causing a significant impact on the patient's life.   I have utilized the care everywhere function in epic to review the outside records available from external health systems.  Review of Systems:  A 10 point review of systems is negative, except for the pertinent positives and negatives detailed in the HPI.  Past Medical History: Past Medical History:  Diagnosis Date   Arthritis    osteo   Asthma    needs rescue inhaler few times a year   Claustrophobia 04/21/2020   Depression    Dizziness    light headed spells but it has been a while   Dysrhythmia    tachycardia.(cardizem). brady during procedures   Fibromyalgia    GERD (gastroesophageal reflux disease)    gastritis   Headache(784.0)    Heart murmur 2019   aortic. dr. Gwen Pounds not worried about this   Hyperlipidemia    Hypertension    Hypothyroidism    Macular degeneration    Migraine    Orthopnea    Pneumonia    long time ago (greater than 5 years ago)   PONV (postoperative nausea and vomiting)    very anxious during cataract surgery. brady during colonoscopy   Shortness of breath    any exertion, cannot lie flat    Past Surgical History: Past Surgical History:  Procedure Laterality Date   BACK SURGERY  2014   removed bone to get to a benign tumor that was pushing on spinal column  BIOPSY THYROID  2018   bladder biopsies  2003   9 biopsies done by dr. cope.  all benign.  inflammatory process going on in bladder   BREAST BIOPSY Right    benign   BREAST SURGERY Right    lumpectomy   CATARACT EXTRACTION W/PHACO Left 12/25/2014   Procedure:  CATARACT EXTRACTION PHACO AND INTRAOCULAR LENS PLACEMENT (IOC);  Surgeon: Galen Manila, MD;  Location: [facility] ORS;  Service: Ophthalmology;  Laterality: Left;  [place] 00:32 AP% 20.0 CDE 6.49   COLONOSCOPY W/ BIOPSIES     COLONOSCOPY W/ POLYPECTOMY  2002, 2004, 2005   adenomatous polyps removed   COLONOSCOPY WITH PROPOFOL N/A 08/23/2017   Procedure: COLONOSCOPY WITH PROPOFOL;  Surgeon: Scot Jun, MD;  Location: [facility] ENDOSCOPY;  Service: Endoscopy;  Laterality: N/A;   COLONOSCOPY WITH PROPOFOL N/A 11/08/2017   Procedure: COLONOSCOPY WITH PROPOFOL;  Surgeon: Scot Jun, MD;  Location: [facility] ENDOSCOPY;  Service: Endoscopy;  Laterality: N/A;   COLONOSCOPY WITH PROPOFOL N/A 06/30/2021   Procedure: COLONOSCOPY WITH PROPOFOL;  Surgeon: Regis Bill, MD;  Location: [facility] ENDOSCOPY;  Service: Endoscopy;  Laterality: N/A;   CYSTOCELE REPAIR N/A 04/09/2016   Procedure: ANTERIOR REPAIR (CYSTOCELE);  Surgeon: Nadara Mustard, MD;  Location: [facility] ORS;  Service: Gynecology;  Laterality: N/A;   DIAGNOSTIC LAPAROSCOPY  2008   removed both ovaries with cysts, tubes and fibroids   DILATION AND CURETTAGE OF UTERUS  1973   ESOPHAGOGASTRODUODENOSCOPY (EGD) WITH PROPOFOL N/A 08/23/2017   Procedure: ESOPHAGOGASTRODUODENOSCOPY (EGD) WITH PROPOFOL;  Surgeon: Scot Jun, MD;  Location: [facility] ENDOSCOPY;  Service: Endoscopy;  Laterality: N/A;   ESOPHAGOGASTRODUODENOSCOPY (EGD) WITH PROPOFOL N/A 06/30/2021   Procedure: ESOPHAGOGASTRODUODENOSCOPY (EGD) WITH PROPOFOL;  Surgeon: Regis Bill, MD;  Location: [facility] ENDOSCOPY;  Service: Endoscopy;  Laterality: N/A;   EYE SURGERY Bilateral 2016   HERNIA REPAIR Right 2008   Inguinal hernia Repair, ventral hernia repair   IR KYPHO THORACIC WITH BONE BIOPSY  05/12/2022   IR RADIOLOGIST EVAL & MGMT  05/19/2022   IR RADIOLOGIST EVAL & MGMT  05/26/2022   JOINT REPLACEMENT Right 2008   knee   KNEE ARTHROSCOPY Right    LUMBAR LAMINECTOMY/DECOMPRESSION  MICRODISCECTOMY Right 03/21/2013   Procedure: Right Lumbar five-Sacral one Laminectomy for Synovial Cyst;  Surgeon: Reinaldo Meeker, MD;  Location: [facility];  Service: Neurosurgery;  Laterality: Right;  right   OOPHORECTOMY     REVERSE SHOULDER ARTHROPLASTY Right 02/22/2018   Procedure: REVERSE SHOULDER ARTHROPLASTY;  Surgeon: Christena Flake, MD;  Location: [facility] ORS;  Service: Orthopedics;  Laterality: Right;   TUBAL LIGATION     UNILATERAL SALPINGECTOMY Left 04/09/2016   Procedure: UNILATERAL SALPINGECTOMY;  Surgeon: Nadara Mustard, MD;  Location: [facility] ORS;  Service: Gynecology;  Laterality: Left;   UPPER GI ENDOSCOPY  2010   with biopsy of gastric erosion   VAGINAL HYSTERECTOMY N/A 04/09/2016   Procedure: HYSTERECTOMY VAGINAL;  Surgeon: Nadara Mustard, MD;  Location: [facility] ORS;  Service: Gynecology;  Laterality: N/A;   VAGINAL HYSTERECTOMY  2017   and bladder tack    Allergies: Allergies as of 03/23/2023 - Review Complete 03/06/2023  Allergen Reaction Noted   Codeine Shortness Of Breath and Itching 03/07/2013   Levofloxacin  11/08/2013   Oxycodone Itching and Shortness Of Breath 02/24/2016   Ultram [tramadol] Itching and Other (See Comments) 03/07/2013   Nitrofurantoin Diarrhea 03/07/2013   Nsaids Other (See Comments) 03/07/2013   Sulfacetamide Rash 08/20/2017   Adhesive [tape]  Other (See Comments) 02/11/2018   Azithromycin  02/05/2014   Ciprofloxacin Other (See Comments) 03/07/2013   Ketorolac Itching 12/14/2013   Sulfa antibiotics Itching, Rash, and Other (See Comments) 03/07/2013   Tolmetin  05/29/2014   Zofran [ondansetron hcl] Other (See Comments) 02/11/2018    Medications:  Current Outpatient Medications:    acetaminophen (TYLENOL) 500 MG tablet, Take 1,000 mg by mouth 2 (two) times daily as needed for moderate pain., Disp: , Rfl:    albuterol (VENTOLIN HFA) 108 (90 Base) MCG/ACT inhaler, Inhale 2 puffs into the lungs every 6 (six) hours as needed for wheezing or  shortness of breath., Disp: 8 g, Rfl: 0   aspirin EC 81 MG tablet, Take 81 mg by mouth at bedtime. , Disp: , Rfl:    BLACK ELDERBERRY PO, Take 1 Dose by mouth 2 (two) times a day., Disp: , Rfl:    Calcium Carbonate-Vitamin D 600-400 MG-UNIT tablet, Take 1 tablet by mouth 2 (two) times daily., Disp: , Rfl:    cholecalciferol (VITAMIN D) 1000 UNITS tablet, Take 1,000 Units by mouth at bedtime. , Disp: , Rfl:    citalopram (CELEXA) 20 MG tablet, Take 1 tablet (20 mg total) by mouth daily., Disp: 90 tablet, Rfl: 1   cyanocobalamin 500 MCG tablet, Take 500 mcg by mouth daily., Disp: , Rfl:    Dextran 70-Hypromellose (ARTIFICIAL TEARS) 0.1-0.3 % SOLN, Apply 1-2 drops to eye daily., Disp: , Rfl:    diclofenac Sodium (VOLTAREN) 1 % GEL, Apply 4 g topically 4 (four) times daily., Disp: 500 g, Rfl: 3   diltiazem (CARDIZEM CD) 120 MG 24 hr capsule, Take 180 mg by mouth at bedtime., Disp: , Rfl: 3   diphenhydrAMINE (BENADRYL) 25 MG tablet, Take 25-50 mg by mouth daily as needed for itching., Disp: , Rfl:    docusate sodium (COLACE) 100 MG capsule, Take 200 mg by mouth at bedtime as needed for moderate constipation. , Disp: , Rfl:    EPINEPHrine (EPIPEN 2-PAK) 0.3 mg/0.3 mL IJ SOAJ injection, use as directed for severe allergy reaction, Disp: 2 each, Rfl: 1   fenofibrate 160 MG tablet, TAKE 1 TABLET DAILY FOR CHOLESTEROL, Disp: 90 tablet, Rfl: 0   fluticasone (FLONASE) 50 MCG/ACT nasal spray, Use 2 sprays in each nostril daily,, Disp: 16 g, Rfl: 2   folic acid (FOLVITE) 400 MCG tablet, Take 400 mcg by mouth daily., Disp: , Rfl:    guaiFENesin (MUCINEX) 600 MG 12 hr tablet, Take 600 mg by mouth at bedtime as needed (congestion)., Disp: , Rfl:    HYDROcodone-acetaminophen (NORCO/VICODIN) 5-325 MG tablet, Take 1 tablet by mouth every 6 (six) hours as needed for severe pain., Disp: 10 tablet, Rfl: 0   Hypromellose (ARTIFICIAL TEARS OP), Place 1-2 drops into both eyes daily as needed (dry eyes)., Disp: , Rfl:     ketotifen (ZADITOR) 0.025 % ophthalmic solution, Place 1 drop into both eyes 2 (two) times daily as needed (allergies)., Disp: , Rfl:    levocetirizine (XYZAL) 5 MG tablet, Take 1 tablet (5 mg total) by mouth every evening., Disp: 90 tablet, Rfl: 0   levothyroxine (SYNTHROID, LEVOTHROID) 50 MCG tablet, Take 50 mcg by mouth daily before breakfast. Brand Name only, Disp: , Rfl:    losartan (COZAAR) 25 MG tablet, Take 25 mg by mouth daily., Disp: , Rfl:    Magnesium 250 MG TABS, Take 1 tablet by mouth daily., Disp: , Rfl:    Melatonin 5 MG TABS, Take 1 tablet by mouth  at bedtime. For sleep, Disp: , Rfl:    metoprolol tartrate (LOPRESSOR) 25 MG tablet, Take 12.5 mg by mouth daily., Disp: , Rfl:    Multiple Vitamins-Minerals (MULTIVITAMIN WITH MINERALS) tablet, Take 1 tablet by mouth daily., Disp: , Rfl:    OXYGEN, Inhale into the lungs. 2 litre at night, Disp: , Rfl:    pantoprazole (PROTONIX) 40 MG tablet, Take 1 tablet (40 mg total) by mouth daily., Disp: 90 tablet, Rfl: 1   predniSONE (STERAPRED UNI-PAK 21 TAB) 10 MG (21) TBPK tablet, Per packaging instructions, Disp: 21 tablet, Rfl: 0   pyridoxine (B-6) 100 MG tablet, Take 100 mg by mouth daily., Disp: , Rfl:    raloxifene (EVISTA) 60 MG tablet, Take 60 mg by mouth daily., Disp: , Rfl:    rosuvastatin (CRESTOR) 5 MG tablet, Take 1 tablet (5 mg total) by mouth daily., Disp: 90 tablet, Rfl: 3   sodium chloride (OCEAN) 0.65 % SOLN nasal spray, Place 1 spray into both nostrils 2 (two) times daily., Disp: , Rfl:    sucralfate (CARAFATE) 1 g tablet, Take 3 times daily with meals., Disp: 90 tablet, Rfl: 2   Vitamin A 7.5 MG (25000 UT) CAPS, Take 1 tablet by mouth 2 (two) times daily., Disp: , Rfl:    zinc gluconate 50 MG tablet, Take 50 mg by mouth daily., Disp: , Rfl:   Social History: Social History   Tobacco Use   Smoking status: Former    Current packs/day: 0.00    Types: Cigarettes    Quit date: 1967    Years since quitting: 57.6    Smokeless tobacco: Never  Vaping Use   Vaping status: Never Used  Substance Use Topics   Alcohol use: Yes    Comment: occasionally   Drug use: No    Family Medical History: Family History  Problem Relation Age of Onset   Pulmonary fibrosis Mother    Melanoma Father    COPD Sister    Cardiomyopathy Sister        and arrhythmia   Atrial fibrillation Sister    COPD Brother    Pulmonary fibrosis Brother    Cancer Brother    Breast cancer Cousin        paternal 1st    Physical Examination: There were no vitals filed for this visit.  General: Patient is in no apparent distress. Attention to examination is appropriate.  Neck:   Supple.  Full range of motion.  Respiratory: Patient is breathing without any difficulty.   NEUROLOGICAL:     Awake, alert, oriented to person, place, and time.  Speech is clear and fluent.   Cranial Nerves: Pupils equal round and reactive to light.  Facial tone is symmetric.  Facial sensation is symmetric. Shoulder shrug is symmetric. Tongue protrusion is midline.  There is no pronator drift.  Strength: Side Biceps Triceps Deltoid Interossei Grip Wrist Ext. Wrist Flex.  R 5 5 5 5 5 5 5   L 5 5 5 5 5 5 5    Side Iliopsoas Quads Hamstring PF DF EHL  R 5 5 5 5 5 5   L 5 5 5 5 5 5    Reflexes are ***2+ and symmetric at the biceps, triceps, brachioradialis, patella and achilles.   Hoffman's is absent.   Bilateral upper and lower extremity sensation is intact to light touch.    No evidence of dysmetria noted.  Gait is normal.     Medical Decision Making  Imaging: ***  I have  personally reviewed the images and agree with the above interpretation.  Assessment and Plan: Ms. Mckibbin is a pleasant 77 y.o. female with ***    Thank you for involving me in the care of this patient.      Valincia Touch K. Myer Haff MD, [facility]

## 2023-03-15 NOTE — Telephone Encounter (Signed)
Transition Care Management Unsuccessful Follow-up Telephone Call  Date of discharge and from where:  [facility] 7/27  Attempts:  2nd Attempt  Reason for unsuccessful TCM follow-up call:  No answer/busy   Lenard Forth [facility] Guide, [facility] [phone] [address] [place], [place], [place] [address] Phone: [phone] Email: [email or web address]

## 2023-03-16 ENCOUNTER — Ambulatory Visit
Admission: RE | Admit: 2023-03-16 | Discharge: 2023-03-16 | Disposition: A | Discharge status: Home / Self Care | Payer: Medicare Other | Source: Ambulatory Visit | Attending: Family Medicine | Admitting: Family Medicine

## 2023-03-16 ENCOUNTER — Telehealth: Payer: Self-pay | Admitting: Nurse Practitioner

## 2023-03-16 ENCOUNTER — Other Ambulatory Visit: Payer: Self-pay | Admitting: Nurse Practitioner

## 2023-03-16 DIAGNOSIS — M5416 Radiculopathy, lumbar region: Secondary | ICD-10-CM

## 2023-03-16 MED ORDER — DIAZEPAM 5 MG PO TABS: 5.0000 mg | ORAL_TABLET | Freq: Once | ORAL | 0 refills | Status: DC | PRN

## 2023-03-16 NOTE — Telephone Encounter (Signed)
Patient called requesting valium for MRI. Stated she called yesterday. I s/w AA, she will put order in to be sent to [facility] - [place] Ditto -Sheralyn Boatman

## 2023-03-16 NOTE — Telephone Encounter (Signed)
S/w pt yesterday regarding needing medication prescribed to help her with claustrophobic

## 2023-03-18 ENCOUNTER — Other Ambulatory Visit: Payer: Self-pay | Admitting: Physician Assistant

## 2023-03-18 DIAGNOSIS — Z1231 Encounter for screening mammogram for malignant neoplasm of breast: Secondary | ICD-10-CM

## 2023-03-23 ENCOUNTER — Encounter: Payer: Self-pay | Admitting: Neurosurgery

## 2023-03-23 ENCOUNTER — Ambulatory Visit (INDEPENDENT_AMBULATORY_CARE_PROVIDER_SITE_OTHER): Payer: Medicare Other | Admitting: Neurosurgery

## 2023-03-23 VITALS — BP 130/78 | Ht 61.0 in | Wt 145.0 lb

## 2023-03-23 DIAGNOSIS — M5186 Other intervertebral disc disorders, lumbar region: Secondary | ICD-10-CM

## 2023-03-23 DIAGNOSIS — M4317 Spondylolisthesis, lumbosacral region: Secondary | ICD-10-CM | POA: Diagnosis not present

## 2023-03-23 DIAGNOSIS — G8929 Other chronic pain: Secondary | ICD-10-CM | POA: Diagnosis not present

## 2023-03-23 DIAGNOSIS — M545 Low back pain, unspecified: Secondary | ICD-10-CM

## 2023-03-24 NOTE — Telephone Encounter (Signed)
Done

## 2023-04-01 ENCOUNTER — Ambulatory Visit: Payer: Medicare Other | Admitting: Physician Assistant

## 2023-04-02 ENCOUNTER — Ambulatory Visit
Admission: RE | Admit: 2023-04-02 | Discharge: 2023-04-02 | Disposition: A | Discharge status: Home / Self Care | Payer: Medicare Other | Source: Ambulatory Visit | Attending: Physician Assistant | Admitting: Physician Assistant

## 2023-04-02 DIAGNOSIS — Z1231 Encounter for screening mammogram for malignant neoplasm of breast: Secondary | ICD-10-CM

## 2023-04-06 DIAGNOSIS — M5416 Radiculopathy, lumbar region: Secondary | ICD-10-CM | POA: Diagnosis not present

## 2023-04-06 DIAGNOSIS — M4317 Spondylolisthesis, lumbosacral region: Secondary | ICD-10-CM | POA: Diagnosis not present

## 2023-04-06 DIAGNOSIS — M5136 Other intervertebral disc degeneration, lumbar region: Secondary | ICD-10-CM | POA: Diagnosis not present

## 2023-04-06 DIAGNOSIS — M4807 Spinal stenosis, lumbosacral region: Secondary | ICD-10-CM | POA: Diagnosis not present

## 2023-04-06 DIAGNOSIS — M47816 Spondylosis without myelopathy or radiculopathy, lumbar region: Secondary | ICD-10-CM | POA: Diagnosis not present

## 2023-04-06 DIAGNOSIS — M1612 Unilateral primary osteoarthritis, left hip: Secondary | ICD-10-CM | POA: Diagnosis not present

## 2023-04-06 DIAGNOSIS — M19012 Primary osteoarthritis, left shoulder: Secondary | ICD-10-CM | POA: Diagnosis not present

## 2023-04-06 DIAGNOSIS — M48062 Spinal stenosis, lumbar region with neurogenic claudication: Secondary | ICD-10-CM | POA: Diagnosis not present

## 2023-04-06 DIAGNOSIS — M5030 Other cervical disc degeneration, unspecified cervical region: Secondary | ICD-10-CM | POA: Diagnosis not present

## 2023-04-06 DIAGNOSIS — M545 Low back pain, unspecified: Secondary | ICD-10-CM | POA: Diagnosis not present

## 2023-04-06 DIAGNOSIS — M461 Sacroiliitis, not elsewhere classified: Secondary | ICD-10-CM | POA: Diagnosis not present

## 2023-04-06 DIAGNOSIS — S22000A Wedge compression fracture of unspecified thoracic vertebra, initial encounter for closed fracture: Secondary | ICD-10-CM | POA: Diagnosis not present

## 2023-04-06 DIAGNOSIS — M5134 Other intervertebral disc degeneration, thoracic region: Secondary | ICD-10-CM | POA: Diagnosis not present

## 2023-04-07 DIAGNOSIS — M461 Sacroiliitis, not elsewhere classified: Secondary | ICD-10-CM | POA: Diagnosis not present

## 2023-04-08 ENCOUNTER — Ambulatory Visit (INDEPENDENT_AMBULATORY_CARE_PROVIDER_SITE_OTHER): Payer: Medicare Other | Admitting: Physician Assistant

## 2023-04-08 ENCOUNTER — Encounter: Payer: Self-pay | Admitting: Physician Assistant

## 2023-04-08 VITALS — BP 130/65 | HR 95 | Temp 98.3°F | Resp 16 | Ht 61.0 in | Wt 149.4 lb

## 2023-04-08 DIAGNOSIS — R195 Other fecal abnormalities: Secondary | ICD-10-CM

## 2023-04-08 DIAGNOSIS — F331 Major depressive disorder, recurrent, moderate: Secondary | ICD-10-CM | POA: Diagnosis not present

## 2023-04-08 DIAGNOSIS — E611 Iron deficiency: Secondary | ICD-10-CM | POA: Diagnosis not present

## 2023-04-08 DIAGNOSIS — R5383 Other fatigue: Secondary | ICD-10-CM | POA: Diagnosis not present

## 2023-04-08 DIAGNOSIS — M064 Inflammatory polyarthropathy: Secondary | ICD-10-CM | POA: Diagnosis not present

## 2023-04-08 DIAGNOSIS — J4520 Mild intermittent asthma, uncomplicated: Secondary | ICD-10-CM | POA: Diagnosis not present

## 2023-04-08 MED ORDER — ALBUTEROL SULFATE HFA 108 (90 BASE) MCG/ACT IN AERS: 2.0000 | INHALATION_SPRAY | Freq: Four times a day (QID) | RESPIRATORY_TRACT | 3 refills | Status: DC | PRN

## 2023-04-08 MED ORDER — DICLOFENAC SODIUM 1 % EX GEL: 4.0000 g | Freq: Four times a day (QID) | CUTANEOUS | 3 refills | Status: AC

## 2023-04-08 NOTE — Progress Notes (Signed)
[facility] [address] [place], [place] [address]  Internal MEDICINE  Office Visit Note  Patient Name: Vanessa Evans  [id number]  [id number]  Date of Service: 04/08/2023  Chief Complaint  Patient presents with   Follow-up   Depression   Gastroesophageal Reflux   Hypertension   Hyperlipidemia    HPI Pt is here for routine follow up -Since last visit has had a visit to ED for severe pain in back, since then has had MRI and is following with her surgeon who does not think surgery is best course at this time. Did get an injection with physiatry yesterday -Pain and limited activity does get her down, as does loss of her son 1year ago -still wants to stay on celexa 20mg , has some of the 10mg  left over and if she feels this is worse could go up to 30mg  if so and will let us know -Seeing cardiology next week for follow up and does plan to discuss some lower extremity swelling -does have fatigue, hx of low iron requiring iron infusions as she cannot take oral iron pills. Also has some dark tarry stools and will contact GI and monitor. Will check labs  Current Medication: Outpatient Encounter Medications as of 04/08/2023  Medication Sig   acetaminophen (TYLENOL) 500 MG tablet Take 1,000 mg by mouth 2 (two) times daily as needed for moderate pain.   aspirin EC 81 MG tablet Take 81 mg by mouth at bedtime.    BLACK ELDERBERRY PO Take 1 Dose by mouth 2 (two) times a day.   Calcium Carbonate-Vitamin D 600-400 MG-UNIT tablet Take 1 tablet by mouth 2 (two) times daily.   cholecalciferol (VITAMIN D) 1000 UNITS tablet Take 1,000 Units by mouth at bedtime.    citalopram (CELEXA) 20 MG tablet Take 1 tablet (20 mg total) by mouth daily.   cyanocobalamin 500 MCG tablet Take 500 mcg by mouth daily.   Dextran 70-Hypromellose (ARTIFICIAL TEARS) 0.1-0.3 % SOLN Apply 1-2 drops to eye daily.   diazepam (VALIUM) 5 MG tablet Take 1-2 tablets (5-10 mg total) by mouth once as needed for up to 1  dose (anxiety due to medical procedure).   diltiazem (CARDIZEM CD) 120 MG 24 hr capsule Take 180 mg by mouth at bedtime.   diphenhydrAMINE (BENADRYL) 25 MG tablet Take 25-50 mg by mouth daily as needed for itching.   docusate sodium (COLACE) 100 MG capsule Take 200 mg by mouth at bedtime as needed for moderate constipation.    EPINEPHrine (EPIPEN 2-PAK) 0.3 mg/0.3 mL IJ SOAJ injection use as directed for severe allergy reaction   fenofibrate 160 MG tablet TAKE 1 TABLET DAILY FOR CHOLESTEROL   fluticasone (FLONASE) 50 MCG/ACT nasal spray Use 2 sprays in each nostril daily,   folic acid (FOLVITE) 400 MCG tablet Take 400 mcg by mouth daily.   guaiFENesin (MUCINEX) 600 MG 12 hr tablet Take 600 mg by mouth at bedtime as needed (congestion).   Hypromellose (ARTIFICIAL TEARS OP) Place 1-2 drops into both eyes daily as needed (dry eyes).   ketotifen (ZADITOR) 0.025 % ophthalmic solution Place 1 drop into both eyes 2 (two) times daily as needed (allergies).   levocetirizine (XYZAL) 5 MG tablet Take 1 tablet (5 mg total) by mouth every evening.   levothyroxine (SYNTHROID, LEVOTHROID) 50 MCG tablet Take 50 mcg by mouth daily before breakfast. Brand Name only   losartan (COZAAR) 25 MG tablet Take 25 mg by mouth daily.   Magnesium 250 MG TABS  Take 1 tablet by mouth daily.   Melatonin 5 MG TABS Take 1 tablet by mouth at bedtime. For sleep   metoprolol tartrate (LOPRESSOR) 25 MG tablet Take 12.5 mg by mouth daily.   Multiple Vitamins-Minerals (MULTIVITAMIN WITH MINERALS) tablet Take 1 tablet by mouth daily.   OXYGEN Inhale into the lungs. 2 litre at night   pantoprazole (PROTONIX) 40 MG tablet Take 1 tablet (40 mg total) by mouth daily.   pyridoxine (B-6) 100 MG tablet Take 100 mg by mouth daily.   raloxifene (EVISTA) 60 MG tablet Take 60 mg by mouth daily.   rosuvastatin (CRESTOR) 5 MG tablet Take 1 tablet (5 mg total) by mouth daily.   sodium chloride (OCEAN) 0.65 % SOLN nasal spray Place 1 spray into both  nostrils 2 (two) times daily.   sucralfate (CARAFATE) 1 g tablet Take 3 times daily with meals.   Vitamin A 7.5 MG (25000 UT) CAPS Take 1 tablet by mouth 2 (two) times daily.   zinc gluconate 50 MG tablet Take 50 mg by mouth daily.   [DISCONTINUED] albuterol (VENTOLIN HFA) 108 (90 Base) MCG/ACT inhaler Inhale 2 puffs into the lungs every 6 (six) hours as needed for wheezing or shortness of breath.   [DISCONTINUED] diclofenac Sodium (VOLTAREN) 1 % GEL Apply 4 g topically 4 (four) times daily.   albuterol (VENTOLIN HFA) 108 (90 Base) MCG/ACT inhaler Inhale 2 puffs into the lungs every 6 (six) hours as needed for wheezing or shortness of breath.   diclofenac Sodium (VOLTAREN) 1 % GEL Apply 4 g topically 4 (four) times daily.   No facility-administered encounter medications on file as of 04/08/2023.    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 cope.  all benign.  inflammatory process going on in bladder   BREAST BIOPSY Right    benign   BREAST SURGERY Right    lumpectomy   CATARACT EXTRACTION W/PHACO Left 12/25/2014   Procedure: CATARACT EXTRACTION PHACO AND INTRAOCULAR LENS PLACEMENT (IOC);  Surgeon: Galen Manila, MD;  Location: [facility] ORS;  Service: Ophthalmology;  Laterality: Left;  [place] 00:32 AP% 20.0 CDE 6.49   COLONOSCOPY W/ BIOPSIES     COLONOSCOPY W/ POLYPECTOMY  2002, 2004, 2005   adenomatous polyps removed   COLONOSCOPY WITH PROPOFOL N/A 08/23/2017   Procedure: COLONOSCOPY WITH PROPOFOL;  Surgeon: Scot Jun, MD;  Location: [facility] ENDOSCOPY;  Service: Endoscopy;  Laterality: N/A;   COLONOSCOPY WITH PROPOFOL N/A 11/08/2017   Procedure: COLONOSCOPY WITH PROPOFOL;  Surgeon: Scot Jun, MD;  Location: [facility] ENDOSCOPY;  Service: Endoscopy;  Laterality: N/A;   COLONOSCOPY WITH PROPOFOL  N/A 06/30/2021   Procedure: COLONOSCOPY WITH PROPOFOL;  Surgeon: Regis Bill, MD;  Location: [facility] ENDOSCOPY;  Service: Endoscopy;  Laterality: N/A;   CYSTOCELE REPAIR N/A 04/09/2016   Procedure: ANTERIOR REPAIR (CYSTOCELE);  Surgeon: Nadara Mustard, MD;  Location: [facility] ORS;  Service: Gynecology;  Laterality: N/A;   DIAGNOSTIC LAPAROSCOPY  2008   removed both ovaries with cysts, tubes and fibroids   DILATION AND CURETTAGE OF UTERUS  1973   ESOPHAGOGASTRODUODENOSCOPY (EGD) WITH PROPOFOL N/A 08/23/2017   Procedure: ESOPHAGOGASTRODUODENOSCOPY (EGD) WITH PROPOFOL;  Surgeon: Scot Jun, MD;  Location: [facility] ENDOSCOPY;  Service: Endoscopy;  Laterality: N/A;  ESOPHAGOGASTRODUODENOSCOPY (EGD) WITH PROPOFOL N/A 06/30/2021   Procedure: ESOPHAGOGASTRODUODENOSCOPY (EGD) WITH PROPOFOL;  Surgeon: Regis Bill, MD;  Location: [facility] ENDOSCOPY;  Service: Endoscopy;  Laterality: N/A;   EYE SURGERY Bilateral 2016   HERNIA REPAIR Right 2008   Inguinal hernia Repair, ventral hernia repair   IR KYPHO THORACIC WITH BONE BIOPSY  05/12/2022   IR RADIOLOGIST EVAL & MGMT  05/19/2022   IR RADIOLOGIST EVAL & MGMT  05/26/2022   JOINT REPLACEMENT Right 2008   knee   KNEE ARTHROSCOPY Right    LUMBAR LAMINECTOMY/DECOMPRESSION MICRODISCECTOMY Right 03/21/2013   Procedure: Right Lumbar five-Sacral one Laminectomy for Synovial Cyst;  Surgeon: Reinaldo Meeker, MD;  Location: [facility] OR[facility];  Service: Neurosurgery;  Laterality: Right;  right   OOPHORECTOMY     REVERSE SHOULDER ARTHROPLASTY Right 02/22/2018   Procedure: REVERSE SHOULDER ARTHROPLASTY;  Surgeon: Christena Flake, MD;  Location: [facility] ORS;  Service: Orthopedics;  Laterality: Right;   TUBAL LIGATION     UNILATERAL SALPINGECTOMY Left 04/09/2016   Procedure: UNILATERAL SALPINGECTOMY;  Surgeon: Nadara Mustard, MD;  Location: [facility] ORS;  Service: Gynecology;  Laterality: Left;   UPPER GI ENDOSCOPY  2010   with biopsy of gastric erosion   VAGINAL  HYSTERECTOMY N/A 04/09/2016   Procedure: HYSTERECTOMY VAGINAL;  Surgeon: Nadara Mustard, MD;  Location: [facility] ORS;  Service: Gynecology;  Laterality: N/A;   VAGINAL HYSTERECTOMY  2017   and bladder tack    Medical History: Past Medical History:  Diagnosis Date   Arthritis    osteo   Asthma    needs rescue inhaler few times a year   Claustrophobia 04/21/2020   Depression    Dizziness    light headed spells but it has been a while   Dysrhythmia    tachycardia.(cardizem). brady during procedures   Fibromyalgia    GERD (gastroesophageal reflux disease)    gastritis   Headache(784.0)    Heart murmur 2019   aortic. dr. Gwen Pounds not worried about this   Hyperlipidemia    Hypertension    Hypothyroidism    Macular degeneration    Migraine    Orthopnea    Pneumonia    long time ago (greater than 5 years ago)   PONV (postoperative nausea and vomiting)    very anxious during cataract surgery. brady during colonoscopy   Shortness of breath    any exertion, cannot lie flat    Family History: Family History  Problem Relation Age of Onset   Pulmonary fibrosis Mother    Melanoma Father    COPD Sister    Cardiomyopathy Sister        and arrhythmia   Atrial fibrillation Sister    COPD Brother    Pulmonary fibrosis Brother    Cancer Brother    Breast cancer Cousin        paternal 1st    Social History   Socioeconomic History   Marital status: Married    Spouse name: Not on file   Number of children: Not on file   Years of education: Not on file   Highest education level: Not on file  Occupational History   Not on file  Tobacco Use   Smoking status: Former    Current packs/day: 0.00    Types: Cigarettes    Quit date: 1967    Years since quitting: 57.6   Smokeless tobacco: Never  Vaping Use   Vaping status: Never Used  Substance and Sexual Activity  Alcohol use: Yes    Comment: occasionally   Drug use: No   Sexual activity: Not Currently  Other Topics Concern    Not on file  Social History Narrative   Not on file   Social Determinants of Health   Financial Resource Strain: Not on file  Food Insecurity: Not on file  Transportation Needs: Not on file  Physical Activity: Not on file  Stress: Not on file  Social Connections: Not on file  Intimate Partner Violence: Not on file      Review of Systems  Constitutional:  Positive for fatigue. Negative for chills and unexpected weight change.  HENT:  Negative for congestion, rhinorrhea, sneezing and sore throat.   Eyes:  Negative for redness.  Respiratory:  Negative for cough, chest tightness and shortness of breath.   Cardiovascular:  Negative for chest pain and palpitations.  Gastrointestinal:  Negative for abdominal pain, constipation, diarrhea, nausea and vomiting.  Genitourinary:  Negative for dysuria and frequency.  Musculoskeletal:  Positive for arthralgias and back pain. Negative for joint swelling and neck pain.  Skin:  Negative for rash.  Neurological: Negative.  Negative for tremors and numbness.  Hematological:  Negative for adenopathy. Does not bruise/bleed easily.  Psychiatric/Behavioral:  Positive for dysphoric mood. Negative for sleep disturbance and suicidal ideas. Behavioral problem: Depression.The patient is nervous/anxious.     Vital Signs: BP 130/65   Pulse 95   Temp 98.3 F (36.8 C)   Resp 16   Ht 5\' 1"  (1.549 m)   Wt 149 lb 6.4 oz (67.8 kg)   SpO2 95%   BMI 28.23 kg/m    Physical Exam Vitals and nursing note reviewed.  Constitutional:      General: She is not in acute distress.    Appearance: She is well-developed. She is not diaphoretic.  HENT:     Head: Normocephalic and atraumatic.     Mouth/Throat:     Pharynx: No oropharyngeal exudate.  Eyes:     Pupils: Pupils are equal, round, and reactive to light.  Neck:     Thyroid: No thyromegaly.     Vascular: No JVD.     Trachea: No tracheal deviation.  Cardiovascular:     Rate and Rhythm: Normal rate  and regular rhythm.     Heart sounds: Normal heart sounds. No murmur heard.    No friction rub. No gallop.  Pulmonary:     Effort: Pulmonary effort is normal. No respiratory distress.     Breath sounds: No wheezing or rales.  Chest:     Chest wall: No tenderness.  Abdominal:     General: Bowel sounds are normal.     Palpations: Abdomen is soft.  Musculoskeletal:     Cervical back: Normal range of motion and neck supple.  Lymphadenopathy:     Cervical: No cervical adenopathy.  Skin:    General: Skin is warm and dry.  Neurological:     Mental Status: She is alert and oriented to person, place, and time.     Cranial Nerves: No cranial nerve deficit.  Psychiatric:        Thought Content: Thought content normal.        Judgment: Judgment normal.     Comments: On verge of tears reminiscing about son after anniversary of his death        Assessment/Plan: 1. Moderate episode of recurrent major depressive disorder (HCC) Continue Celexa, may increase to 30mg  if needed in future  2. Inflammatory polyarthritis (HCC) -  diclofenac Sodium (VOLTAREN) 1 % GEL; Apply 4 g topically 4 (four) times daily.  Dispense: 500 g; Refill: 3  3. Other fatigue - Fe+TIBC+Fer - CBC w/Diff/Platelet  4. Iron deficiency - Fe+TIBC+Fer - CBC w/Diff/Platelet  5. Dark stools Will monitor and follow up with GI, will check labs  6. Chronic asthma, mild intermittent, uncomplicated - albuterol (VENTOLIN HFA) 108 (90 Base) MCG/ACT inhaler; Inhale 2 puffs into the lungs every 6 (six) hours as needed for wheezing or shortness of breath.  Dispense: 8 g; Refill: 3   General Counseling: Breanda verbalizes understanding of the findings of todays visit and agrees with plan of treatment. I have discussed any further diagnostic evaluation that may be needed or ordered today. We also reviewed her medications today. she has been encouraged to call the office with any questions or concerns that should arise related to  todays visit.    Orders Placed This Encounter  Procedures   Fe+TIBC+Fer   CBC w/Diff/Platelet    Meds ordered this encounter  Medications   albuterol (VENTOLIN HFA) 108 (90 Base) MCG/ACT inhaler    Sig: Inhale 2 puffs into the lungs every 6 (six) hours as needed for wheezing or shortness of breath.    Dispense:  8 g    Refill:  3   diclofenac Sodium (VOLTAREN) 1 % GEL    Sig: Apply 4 g topically 4 (four) times daily.    Dispense:  500 g    Refill:  3    This patient was seen by Lynn Ito, PA-C in collaboration with Dr. Beverely Risen as a part of collaborative care agreement.   Total time spent:30 Minutes Time spent includes review of chart, medications, test results, and follow up plan with the patient.      Dr Lyndon Code Internal medicine

## 2023-04-09 LAB — CBC WITH DIFFERENTIAL/PLATELET
Basophils Absolute: 0 10*3/uL (ref 0.0–0.2)
Basos: 0 %
EOS (ABSOLUTE): 0 10*3/uL (ref 0.0–0.4)
Eos: 0 %
Hematocrit: 35.2 % (ref 34.0–46.6)
Hemoglobin: 11.2 g/dL (ref 11.1–15.9)
Immature Grans (Abs): 0.1 10*3/uL (ref 0.0–0.1)
Immature Granulocytes: 1 %
Lymphocytes Absolute: 1.4 10*3/uL (ref 0.7–3.1)
Lymphs: 10 %
MCH: 28 pg (ref 26.6–33.0)
MCHC: 31.8 g/dL (ref 31.5–35.7)
MCV: 88 fL (ref 79–97)
Monocytes Absolute: 1 10*3/uL — ABNORMAL HIGH (ref 0.1–0.9)
Monocytes: 7 %
Neutrophils Absolute: 11.8 10*3/uL — ABNORMAL HIGH (ref 1.4–7.0)
Neutrophils: 82 %
Platelets: 413 10*3/uL (ref 150–450)
RBC: 4 x10E6/uL (ref 3.77–5.28)
RDW: 12.2 % (ref 11.7–15.4)
WBC: 14.3 10*3/uL — ABNORMAL HIGH (ref 3.4–10.8)

## 2023-04-09 LAB — IRON,TIBC AND FERRITIN PANEL
Ferritin: 56 ng/mL (ref 15–150)
Iron Saturation: 12 % — ABNORMAL LOW (ref 15–55)
Iron: 61 ug/dL (ref 27–139)
Total Iron Binding Capacity: 497 ug/dL — ABNORMAL HIGH (ref 250–450)
UIBC: 436 ug/dL — ABNORMAL HIGH (ref 118–369)

## 2023-04-13 ENCOUNTER — Telehealth: Payer: Self-pay

## 2023-04-13 DIAGNOSIS — K219 Gastro-esophageal reflux disease without esophagitis: Secondary | ICD-10-CM | POA: Diagnosis not present

## 2023-04-13 DIAGNOSIS — J4520 Mild intermittent asthma, uncomplicated: Secondary | ICD-10-CM | POA: Diagnosis not present

## 2023-04-13 DIAGNOSIS — I25119 Atherosclerotic heart disease of native coronary artery with unspecified angina pectoris: Secondary | ICD-10-CM | POA: Diagnosis not present

## 2023-04-13 DIAGNOSIS — R0902 Hypoxemia: Secondary | ICD-10-CM | POA: Diagnosis not present

## 2023-04-13 DIAGNOSIS — R001 Bradycardia, unspecified: Secondary | ICD-10-CM | POA: Diagnosis not present

## 2023-04-13 DIAGNOSIS — R600 Localized edema: Secondary | ICD-10-CM | POA: Diagnosis not present

## 2023-04-13 DIAGNOSIS — I471 Supraventricular tachycardia, unspecified: Secondary | ICD-10-CM | POA: Diagnosis not present

## 2023-04-13 DIAGNOSIS — R002 Palpitations: Secondary | ICD-10-CM | POA: Diagnosis not present

## 2023-04-13 DIAGNOSIS — I700 Atherosclerosis of aorta: Secondary | ICD-10-CM | POA: Diagnosis not present

## 2023-04-13 DIAGNOSIS — I10 Essential (primary) hypertension: Secondary | ICD-10-CM | POA: Diagnosis not present

## 2023-04-13 DIAGNOSIS — I2510 Atherosclerotic heart disease of native coronary artery without angina pectoris: Secondary | ICD-10-CM | POA: Diagnosis not present

## 2023-04-13 DIAGNOSIS — R079 Chest pain, unspecified: Secondary | ICD-10-CM | POA: Diagnosis not present

## 2023-04-13 NOTE — Telephone Encounter (Signed)
Sent patient MyChart message regarding lab results

## 2023-04-13 NOTE — Telephone Encounter (Signed)
-----   Message from Carlean Jews sent at 04/13/2023 12:03 PM EDT ----- Please call to check on her and see how she is feeling? let her know that her WBC were elevated which can happen for many reasons especially if any infection or sometimes from stress/inflammation. Her iron saturation was a little low, but overall iron count normal. Given this I am not sure that she would meet criteria for iron infusions. I can try sending a referral to hematology if she would like to discuss further

## 2023-04-15 ENCOUNTER — Encounter: Payer: Self-pay | Admitting: Physician Assistant

## 2023-04-15 ENCOUNTER — Ambulatory Visit (INDEPENDENT_AMBULATORY_CARE_PROVIDER_SITE_OTHER): Payer: Medicare Other | Admitting: Physician Assistant

## 2023-04-15 VITALS — BP 138/70 | HR 82 | Temp 97.8°F | Resp 16 | Ht 61.0 in | Wt 148.2 lb

## 2023-04-15 DIAGNOSIS — D72829 Elevated white blood cell count, unspecified: Secondary | ICD-10-CM

## 2023-04-15 DIAGNOSIS — E611 Iron deficiency: Secondary | ICD-10-CM

## 2023-04-15 DIAGNOSIS — R5383 Other fatigue: Secondary | ICD-10-CM

## 2023-04-15 NOTE — Progress Notes (Signed)
[facility] [address] [place], [place] [address]  Internal MEDICINE  Office Visit Note  Patient Name: Vanessa Evans  [id number]  [id number]  Date of Service: 04/15/2023  Chief Complaint  Patient presents with   Follow-up    Review labs   Depression   Gastroesophageal Reflux   Hypertension   Hyperlipidemia    HPI Pt is here for routine follow up to review labs -iron sat levels a little low, but not as low as last time she had infusion. She does have fatigue and would be interested in infusions if able. Discussed level may not be low enough for infusion, but will send referral to discuss further. She cannot take any oral iron supplement.  -does state no dark stools since last visit and will keep monitoring -WBC elevated, did have injection the day prior and may have affected labs as she has no symptoms of infection  -Will recheck CBC in a week to see if improving -BP borderline, stable in cardiology office the other day and swelling resolved  Current Medication: Outpatient Encounter Medications as of 04/15/2023  Medication Sig   acetaminophen (TYLENOL) 500 MG tablet Take 1,000 mg by mouth 2 (two) times daily as needed for moderate pain.   albuterol (VENTOLIN HFA) 108 (90 Base) MCG/ACT inhaler Inhale 2 puffs into the lungs every 6 (six) hours as needed for wheezing or shortness of breath.   aspirin EC 81 MG tablet Take 81 mg by mouth at bedtime.    BLACK ELDERBERRY PO Take 1 Dose by mouth 2 (two) times a day.   Calcium Carbonate-Vitamin D 600-400 MG-UNIT tablet Take 1 tablet by mouth 2 (two) times daily.   cholecalciferol (VITAMIN D) 1000 UNITS tablet Take 1,000 Units by mouth at bedtime.    citalopram (CELEXA) 20 MG tablet Take 1 tablet (20 mg total) by mouth daily.   cyanocobalamin 500 MCG tablet Take 500 mcg by mouth daily.   Dextran 70-Hypromellose (ARTIFICIAL TEARS) 0.1-0.3 % SOLN Apply 1-2 drops to eye daily.   diazepam (VALIUM) 5 MG tablet Take 1-2 tablets  (5-10 mg total) by mouth once as needed for up to 1 dose (anxiety due to medical procedure).   diclofenac Sodium (VOLTAREN) 1 % GEL Apply 4 g topically 4 (four) times daily.   diltiazem (CARDIZEM CD) 120 MG 24 hr capsule Take 180 mg by mouth at bedtime.   diphenhydrAMINE (BENADRYL) 25 MG tablet Take 25-50 mg by mouth daily as needed for itching.   docusate sodium (COLACE) 100 MG capsule Take 200 mg by mouth at bedtime as needed for moderate constipation.    EPINEPHrine (EPIPEN 2-PAK) 0.3 mg/0.3 mL IJ SOAJ injection use as directed for severe allergy reaction   fenofibrate 160 MG tablet TAKE 1 TABLET DAILY FOR CHOLESTEROL   fluticasone (FLONASE) 50 MCG/ACT nasal spray Use 2 sprays in each nostril daily,   folic acid (FOLVITE) 400 MCG tablet Take 400 mcg by mouth daily.   guaiFENesin (MUCINEX) 600 MG 12 hr tablet Take 600 mg by mouth at bedtime as needed (congestion).   Hypromellose (ARTIFICIAL TEARS OP) Place 1-2 drops into both eyes daily as needed (dry eyes).   ketotifen (ZADITOR) 0.025 % ophthalmic solution Place 1 drop into both eyes 2 (two) times daily as needed (allergies).   levocetirizine (XYZAL) 5 MG tablet Take 1 tablet (5 mg total) by mouth every evening.   levothyroxine (SYNTHROID, LEVOTHROID) 50 MCG tablet Take 50 mcg by mouth daily before breakfast. Brand Name only  losartan (COZAAR) 25 MG tablet Take 25 mg by mouth daily.   Magnesium 250 MG TABS Take 1 tablet by mouth daily.   Melatonin 5 MG TABS Take 1 tablet by mouth at bedtime. For sleep   metoprolol tartrate (LOPRESSOR) 25 MG tablet Take 12.5 mg by mouth daily.   Multiple Vitamins-Minerals (MULTIVITAMIN WITH MINERALS) tablet Take 1 tablet by mouth daily.   OXYGEN Inhale into the lungs. 2 litre at night   pantoprazole (PROTONIX) 40 MG tablet Take 1 tablet (40 mg total) by mouth daily.   pyridoxine (B-6) 100 MG tablet Take 100 mg by mouth daily.   raloxifene (EVISTA) 60 MG tablet Take 60 mg by mouth daily.   rosuvastatin  (CRESTOR) 5 MG tablet Take 1 tablet (5 mg total) by mouth daily.   sodium chloride (OCEAN) 0.65 % SOLN nasal spray Place 1 spray into both nostrils 2 (two) times daily.   sucralfate (CARAFATE) 1 g tablet Take 3 times daily with meals.   Vitamin A 7.5 MG (25000 UT) CAPS Take 1 tablet by mouth 2 (two) times daily.   zinc gluconate 50 MG tablet Take 50 mg by mouth daily.   No facility-administered encounter medications on file as of 04/15/2023.    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 cope.  all benign.  inflammatory process going on in bladder   BREAST BIOPSY Right    benign   BREAST SURGERY Right    lumpectomy   CATARACT EXTRACTION W/PHACO Left 12/25/2014   Procedure: CATARACT EXTRACTION PHACO AND INTRAOCULAR LENS PLACEMENT (IOC);  Surgeon: Galen Manila, MD;  Location: [facility] ORS;  Service: Ophthalmology;  Laterality: Left;  [place] 00:32 AP% 20.0 CDE 6.49   COLONOSCOPY W/ BIOPSIES     COLONOSCOPY W/ POLYPECTOMY  2002, 2004, 2005   adenomatous polyps removed   COLONOSCOPY WITH PROPOFOL N/A 08/23/2017   Procedure: COLONOSCOPY WITH PROPOFOL;  Surgeon: Scot Jun, MD;  Location: [facility] ENDOSCOPY;  Service: Endoscopy;  Laterality: N/A;   COLONOSCOPY WITH PROPOFOL N/A 11/08/2017   Procedure: COLONOSCOPY WITH PROPOFOL;  Surgeon: Scot Jun, MD;  Location: [facility] ENDOSCOPY;  Service: Endoscopy;  Laterality: N/A;   COLONOSCOPY WITH PROPOFOL N/A 06/30/2021   Procedure: COLONOSCOPY WITH PROPOFOL;  Surgeon: Regis Bill, MD;  Location: [facility] ENDOSCOPY;  Service: Endoscopy;  Laterality: N/A;   CYSTOCELE REPAIR N/A 04/09/2016   Procedure: ANTERIOR REPAIR (CYSTOCELE);  Surgeon: Nadara Mustard, MD;  Location: [facility] ORS;  Service: Gynecology;  Laterality: N/A;   DIAGNOSTIC LAPAROSCOPY  2008    removed both ovaries with cysts, tubes and fibroids   DILATION AND CURETTAGE OF UTERUS  1973   ESOPHAGOGASTRODUODENOSCOPY (EGD) WITH PROPOFOL N/A 08/23/2017   Procedure: ESOPHAGOGASTRODUODENOSCOPY (EGD) WITH PROPOFOL;  Surgeon: Scot Jun, MD;  Location: [facility] ENDOSCOPY;  Service: Endoscopy;  Laterality: N/A;   ESOPHAGOGASTRODUODENOSCOPY (EGD) WITH PROPOFOL N/A 06/30/2021   Procedure: ESOPHAGOGASTRODUODENOSCOPY (EGD) WITH PROPOFOL;  Surgeon: Regis Bill, MD;  Location: [facility] ENDOSCOPY;  Service: Endoscopy;  Laterality: N/A;   EYE SURGERY Bilateral 2016   HERNIA REPAIR Right 2008   Inguinal hernia Repair, ventral hernia repair   IR KYPHO THORACIC WITH BONE BIOPSY  05/12/2022   IR RADIOLOGIST EVAL & MGMT  05/19/2022   IR RADIOLOGIST  EVAL & MGMT  05/26/2022   JOINT REPLACEMENT Right 2008   knee   KNEE ARTHROSCOPY Right    LUMBAR LAMINECTOMY/DECOMPRESSION MICRODISCECTOMY Right 03/21/2013   Procedure: Right Lumbar five-Sacral one Laminectomy for Synovial Cyst;  Surgeon: Reinaldo Meeker, MD;  Location: [facility]UR[facility] OR[facility];  Service: Neurosurgery;  Laterality: Right;  right   OOPHORECTOMY     REVERSE SHOULDER ARTHROPLASTY Right 02/22/2018   Procedure: REVERSE SHOULDER ARTHROPLASTY;  Surgeon: Christena Flake, MD;  Location: [facility] ORS;  Service: Orthopedics;  Laterality: Right;   TUBAL LIGATION     UNILATERAL SALPINGECTOMY Left 04/09/2016   Procedure: UNILATERAL SALPINGECTOMY;  Surgeon: Nadara Mustard, MD;  Location: [facility] ORS;  Service: Gynecology;  Laterality: Left;   UPPER GI ENDOSCOPY  2010   with biopsy of gastric erosion   VAGINAL HYSTERECTOMY N/A 04/09/2016   Procedure: HYSTERECTOMY VAGINAL;  Surgeon: Nadara Mustard, MD;  Location: [facility] ORS;  Service: Gynecology;  Laterality: N/A;   VAGINAL HYSTERECTOMY  2017   and bladder tack    Medical History: Past Medical History:  Diagnosis Date   Arthritis    osteo   Asthma    needs rescue inhaler few times a year   Claustrophobia 04/21/2020    Depression    Dizziness    light headed spells but it has been a while   Dysrhythmia    tachycardia.(cardizem). brady during procedures   Fibromyalgia    GERD (gastroesophageal reflux disease)    gastritis   Headache(784.0)    Heart murmur 2019   aortic. dr. Gwen Pounds not worried about this   Hyperlipidemia    Hypertension    Hypothyroidism    Macular degeneration    Migraine    Orthopnea    Pneumonia    long time ago (greater than 5 years ago)   PONV (postoperative nausea and vomiting)    very anxious during cataract surgery. brady during colonoscopy   Shortness of breath    any exertion, cannot lie flat    Family History: Family History  Problem Relation Age of Onset   Pulmonary fibrosis Mother    Melanoma Father    COPD Sister    Cardiomyopathy Sister        and arrhythmia   Atrial fibrillation Sister    COPD Brother    Pulmonary fibrosis Brother    Cancer Brother    Breast cancer Cousin        paternal 1st    Social History   Socioeconomic History   Marital status: Married    Spouse name: Not on file   Number of children: Not on file   Years of education: Not on file   Highest education level: Not on file  Occupational History   Not on file  Tobacco Use   Smoking status: Former    Current packs/day: 0.00    Types: Cigarettes    Quit date: 1967    Years since quitting: 57.7   Smokeless tobacco: Never  Vaping Use   Vaping status: Never Used  Substance and Sexual Activity   Alcohol use: Yes    Comment: occasionally   Drug use: No   Sexual activity: Not Currently  Other Topics Concern   Not on file  Social History Narrative   Not on file   Social Determinants of Health   Financial Resource Strain: Not on file  Food Insecurity: Not on file  Transportation Needs: Not on file  Physical Activity: Not on file  Stress: Not  on file  Social Connections: Not on file  Intimate Partner Violence: Not on file      Review of Systems   Constitutional:  Positive for fatigue. Negative for chills and unexpected weight change.  HENT:  Negative for congestion, rhinorrhea, sneezing and sore throat.   Eyes:  Negative for redness.  Respiratory:  Negative for cough, chest tightness and shortness of breath.   Cardiovascular:  Negative for chest pain and palpitations.  Gastrointestinal:  Negative for abdominal pain, constipation, diarrhea, nausea and vomiting.  Genitourinary:  Negative for dysuria and frequency.  Musculoskeletal:  Positive for arthralgias and back pain. Negative for joint swelling and neck pain.  Skin:  Negative for rash.  Neurological: Negative.  Negative for tremors and numbness.  Hematological:  Negative for adenopathy. Does not bruise/bleed easily.  Psychiatric/Behavioral:  Positive for dysphoric mood. Negative for sleep disturbance and suicidal ideas. Behavioral problem: Depression.The patient is nervous/anxious.     Vital Signs: BP 138/70 Comment: 140/70  Pulse 82   Temp 97.8 F (36.6 C)   Resp 16   Ht 5\' 1"  (1.549 m)   Wt 148 lb 3.2 oz (67.2 kg)   SpO2 95%   BMI 28.00 kg/m    Physical Exam Constitutional:      General: She is not in acute distress.    Appearance: She is well-developed. She is not diaphoretic.  HENT:     Head: Normocephalic and atraumatic.     Mouth/Throat:     Pharynx: No oropharyngeal exudate.  Eyes:     Pupils: Pupils are equal, round, and reactive to light.  Neck:     Thyroid: No thyromegaly.     Vascular: No JVD.     Trachea: No tracheal deviation.  Cardiovascular:     Rate and Rhythm: Normal rate and regular rhythm.     Heart sounds: Normal heart sounds. No murmur heard.    No friction rub. No gallop.  Pulmonary:     Effort: Pulmonary effort is normal. No respiratory distress.     Breath sounds: No wheezing or rales.  Chest:     Chest wall: No tenderness.  Abdominal:     General: Bowel sounds are normal.     Palpations: Abdomen is soft.  Musculoskeletal:         General: Normal range of motion.     Cervical back: Normal range of motion and neck supple.  Lymphadenopathy:     Cervical: No cervical adenopathy.  Skin:    General: Skin is warm and dry.  Neurological:     Mental Status: She is alert and oriented to person, place, and time.     Cranial Nerves: No cranial nerve deficit.  Psychiatric:        Behavior: Behavior normal.        Thought Content: Thought content normal.        Judgment: Judgment normal.        Assessment/Plan: 1. Leukocytosis, unspecified type May be due to recent back injection, will recheck labs in 1 week for monitoring. Pt feels well. - CBC w/Diff/Platelet  2. Other fatigue - Ambulatory referral to Hematology / Oncology  3. Iron deficiency Slightly low iron saturation with fatigue and inability to take PO iron, will refer to discuss restarting infusions if candidate - Ambulatory referral to Hematology / Oncology   General Counseling: Deriana verbalizes understanding of the findings of todays visit and agrees with plan of treatment. I have discussed any further diagnostic evaluation that may be  needed or ordered today. We also reviewed her medications today. she has been encouraged to call the office with any questions or concerns that should arise related to todays visit.    Orders Placed This Encounter  Procedures   CBC w/Diff/Platelet   Ambulatory referral to Hematology / Oncology    No orders of the defined types were placed in this encounter.   This patient was seen by Lynn Ito, PA-C in collaboration with Dr. Beverely Risen as a part of collaborative care agreement.   Total time spent:30 Minutes Time spent includes review of chart, medications, test results, and follow up plan with the patient.      Dr Lyndon Code Internal medicine

## 2023-04-19 DIAGNOSIS — H353211 Exudative age-related macular degeneration, right eye, with active choroidal neovascularization: Secondary | ICD-10-CM | POA: Diagnosis not present

## 2023-04-21 ENCOUNTER — Encounter: Payer: Self-pay | Admitting: Oncology

## 2023-04-21 ENCOUNTER — Inpatient Hospital Stay: Payer: Medicare Other | Attending: Oncology | Admitting: Oncology

## 2023-04-21 ENCOUNTER — Inpatient Hospital Stay: Payer: Medicare Other

## 2023-04-21 VITALS — BP 140/72 | HR 77 | Temp 98.1°F | Resp 18 | Wt 149.8 lb

## 2023-04-21 DIAGNOSIS — D509 Iron deficiency anemia, unspecified: Secondary | ICD-10-CM | POA: Insufficient documentation

## 2023-04-21 DIAGNOSIS — I10 Essential (primary) hypertension: Secondary | ICD-10-CM | POA: Insufficient documentation

## 2023-04-21 DIAGNOSIS — Z87891 Personal history of nicotine dependence: Secondary | ICD-10-CM | POA: Diagnosis not present

## 2023-04-22 ENCOUNTER — Encounter: Payer: Self-pay | Admitting: Oncology

## 2023-04-22 NOTE — Progress Notes (Signed)
Hematology/Oncology Consult note [facility] Telephone:([phone] [phone]  Patient Care Team: Alan Ripper as PCP - General (Physician Assistant) Creig Hines, MD as Consulting Physician (Oncology)   Name of the patient: Vanessa Evans  [id number]  1946/07/04    Reason for referral-iron deficiency anemia   Referring physician-Lauren Mcdonough  Date of visit: 04/22/23   History of presenting illness- Patient is a 77 year old female who has seen me back in 2020 for symptoms of iron deficiency and has received IV iron in the past.  Her recent CBC from August 2024 showed H&H of 11.2/35.2.  Ferritin levels were normal at 56 but iron saturation was low at 12% with elevated TIBC of 497.  Patient had an upper endoscopy and colonoscopy in 2022 and she was not deemed to require any further colonoscopies due to her age.  Presently patient reports feeling fatigued.  Denies any blood loss in her stool or urine.  Denies any dark melanotic stools.  ECOG PS- 1  Pain scale- 0   Review of systems- Review of Systems  Constitutional:  Positive for malaise/fatigue. Negative for chills, fever and weight loss.  HENT:  Negative for congestion, ear discharge and nosebleeds.   Eyes:  Negative for blurred vision.  Respiratory:  Negative for cough, hemoptysis, sputum production, shortness of breath and wheezing.   Cardiovascular:  Negative for chest pain, palpitations, orthopnea and claudication.  Gastrointestinal:  Negative for abdominal pain, blood in stool, constipation, diarrhea, heartburn, melena, nausea and vomiting.  Genitourinary:  Negative for dysuria, flank pain, frequency, hematuria and urgency.  Musculoskeletal:  Negative for back pain, joint pain and myalgias.  Skin:  Negative for rash.  Neurological:  Negative for dizziness, tingling, focal weakness, seizures, weakness and headaches.  Endo/Heme/Allergies:  Does not bruise/bleed easily.   Psychiatric/Behavioral:  Negative for depression and suicidal ideas. The patient does not have insomnia.     Allergies  Allergen Reactions   Codeine Shortness Of Breath and Itching    Bronchospasms and asthma attach   Levofloxacin     Altered mental status": drunk" feeling, confused, speech problems Other reaction(s): Unknown    Oxycodone Itching and Shortness Of Breath    Would take benadryl to eliminate itching. Has had bronchospasm   Ultram [Tramadol] Itching and Other (See Comments)    Bronchospasm and asthma attack   Nitrofurantoin Diarrhea    Tried again and had no problems with it Severe and long lasting   Nsaids Other (See Comments)    Bloody stools, abdominal pain, ulcer History of gastritis   Sulfacetamide Rash    Fever, abdominal pain   Adhesive [Tape] Other (See Comments)    Thin skin. Causes tears. Paper tape okay   Azithromycin     Unsure. Patient does not remember but thinks that it did not work for her.   Ciprofloxacin Other (See Comments)    Severe pain in neck and down back    Ketorolac Itching   Sulfa Antibiotics Itching, Rash and Other (See Comments)    fever   Tolmetin     Other reaction(s): Other (See Comments) Bloody stools, abdominal pain History of chronic gastritis   Zofran [Ondansetron Hcl] Other (See Comments)    constipation    Patient Active Problem List   Diagnosis Date Noted   Lymphedema 01/18/2022   Coronary artery disease involving native coronary artery of native heart with angina pectoris (HCC) 10/28/2020   Peptic ulcer disease 04/21/2020   Claustrophobia 04/21/2020  Urinary tract infection without hematuria 02/04/2020   Pain in both lower extremities 12/18/2019   Inflammatory polyarthritis (HCC) 07/26/2019   Excessive daytime sleepiness 07/26/2019   Encounter for screening mammogram for malignant neoplasm of breast 07/26/2019   Intercostal muscle pain 06/25/2019   Positive ANA (antinuclear antibody) 05/01/2019   Primary  osteoarthritis involving multiple joints 04/26/2019   Mild intermittent asthma without complication 08/22/2018   Seasonal allergic rhinitis due to pollen 08/22/2018   Chills with fever 08/11/2018   Sore throat 08/11/2018   Acute upper respiratory infection 08/11/2018   Candidiasis 08/11/2018   Iron deficiency anemia 05/05/2018   Right hip pain 04/27/2018   Accidental fall from furniture 04/27/2018   Vitamin D deficiency 04/27/2018   Cough 04/18/2018   Dysuria 04/18/2018   Status post reverse total shoulder replacement, right 02/22/2018   Abnormal ECG 02/18/2018   Pain in joint of right shoulder 02/13/2018   Abdominal aortic atherosclerosis (HCC) 02/13/2018   Episode of recurrent major depressive disorder (HCC) 02/13/2018   Encounter for general adult medical examination with abnormal findings 02/13/2018   Lipoma of right shoulder 01/31/2018   Rotator cuff tendinitis, right 01/31/2018   Chronic superficial gastritis without bleeding 12/12/2017   Schatzki's ring 12/12/2017   Acute gastritis 11/08/2017   Nausea 10/18/2017   Other fatigue 10/18/2017   Allergic rhinitis 09/20/2017   Eczema 09/20/2017   Hematuria 09/20/2017   Mixed hyperlipidemia 09/20/2017   Osteoarthritis 09/20/2017   Osteoporosis, post-menopausal 09/20/2017   Palpitations 07/05/2017   Epigastric pain 05/24/2017   Gastroesophageal reflux disease without esophagitis 05/24/2017   Impingement syndrome of left shoulder 10/02/2016   Trochanteric bursitis of left hip 10/02/2016   SOB (shortness of breath) 04/16/2016   Uterine prolapse 04/09/2016   Cystocele 04/09/2016   Hyponatremia 04/09/2016   Benign essential HTN 11/18/2015   Paroxysmal supraventricular tachycardia 11/18/2015   Premature ventricular contraction 07/20/2014   History of colonic polyps 05/29/2014   Asthma 02/05/2014   Chest pain 02/05/2014   Increased frequency of urination 02/05/2014   Tachycardia 02/05/2014   Female stress incontinence  12/14/2013   Gross hematuria 12/14/2013   Incomplete emptying of bladder 12/14/2013   Other chronic cystitis without hematuria 12/14/2013     Past Medical History:  Diagnosis Date   Arthritis    osteo   Asthma    needs rescue inhaler few times a year   Claustrophobia 04/21/2020   Depression    Dizziness    light headed spells but it has been a while   Dysrhythmia    tachycardia.(cardizem). brady during procedures   Fibromyalgia    GERD (gastroesophageal reflux disease)    gastritis   Headache(784.0)    Heart murmur 2019   aortic. dr. Gwen Pounds not worried about this   Hyperlipidemia    Hypertension    Hypothyroidism    Macular degeneration    Migraine    Orthopnea    Pneumonia    long time ago (greater than 5 years ago)   PONV (postoperative nausea and vomiting)    very anxious during cataract surgery. 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 cope.  all benign.  inflammatory process going on in bladder   BREAST BIOPSY Right    benign   BREAST SURGERY Right    lumpectomy   CATARACT EXTRACTION W/PHACO Left 12/25/2014   Procedure: CATARACT EXTRACTION PHACO AND INTRAOCULAR LENS PLACEMENT (IOC);  Surgeon: Galen Manila, MD;  Location: [facility] ORS;  Service: Ophthalmology;  Laterality: Left;  [place] 00:32 AP% 20.0 CDE 6.49   COLONOSCOPY W/ BIOPSIES     COLONOSCOPY W/ POLYPECTOMY  2002, 2004, 2005   adenomatous polyps removed   COLONOSCOPY WITH PROPOFOL N/A 08/23/2017   Procedure: COLONOSCOPY WITH PROPOFOL;  Surgeon: Scot Jun, MD;  Location: [facility] ENDOSCOPY;  Service: Endoscopy;  Laterality: N/A;   COLONOSCOPY WITH PROPOFOL N/A 11/08/2017   Procedure: COLONOSCOPY WITH PROPOFOL;  Surgeon: Scot Jun,  MD;  Location: [facility] ENDOSCOPY;  Service: Endoscopy;  Laterality: N/A;   COLONOSCOPY WITH PROPOFOL N/A 06/30/2021   Procedure: COLONOSCOPY WITH PROPOFOL;  Surgeon: Regis Bill, MD;  Location: [facility] ENDOSCOPY;  Service: Endoscopy;  Laterality: N/A;   CYSTOCELE REPAIR N/A 04/09/2016   Procedure: ANTERIOR REPAIR (CYSTOCELE);  Surgeon: Nadara Mustard, MD;  Location: [facility] ORS;  Service: Gynecology;  Laterality: N/A;   DIAGNOSTIC LAPAROSCOPY  2008   removed both ovaries with cysts, tubes and fibroids   DILATION AND CURETTAGE OF UTERUS  1973   ESOPHAGOGASTRODUODENOSCOPY (EGD) WITH PROPOFOL N/A 08/23/2017   Procedure: ESOPHAGOGASTRODUODENOSCOPY (EGD) WITH PROPOFOL;  Surgeon: Scot Jun, MD;  Location: [facility] ENDOSCOPY;  Service: Endoscopy;  Laterality: N/A;   ESOPHAGOGASTRODUODENOSCOPY (EGD) WITH PROPOFOL N/A 06/30/2021   Procedure: ESOPHAGOGASTRODUODENOSCOPY (EGD) WITH PROPOFOL;  Surgeon: Regis Bill, MD;  Location: [facility] ENDOSCOPY;  Service: Endoscopy;  Laterality: N/A;   EYE SURGERY Bilateral 2016   HERNIA REPAIR Right 2008   Inguinal hernia Repair, ventral hernia repair   IR KYPHO THORACIC WITH BONE BIOPSY  05/12/2022   IR RADIOLOGIST EVAL & MGMT  05/19/2022   IR RADIOLOGIST EVAL & MGMT  05/26/2022   JOINT REPLACEMENT Right 2008   knee   KNEE ARTHROSCOPY Right    LUMBAR LAMINECTOMY/DECOMPRESSION MICRODISCECTOMY Right 03/21/2013   Procedure: Right Lumbar five-Sacral one Laminectomy for Synovial Cyst;  Surgeon: Reinaldo Meeker, MD;  Location: [facility]O [facility];  Service: Neurosurgery;  Laterality: Right;  right   OOPHORECTOMY     REVERSE SHOULDER ARTHROPLASTY Right 02/22/2018   Procedure: REVERSE SHOULDER ARTHROPLASTY;  Surgeon: Christena Flake, MD;  Location: [facility] OR[facility];  Service: Orthopedics;  Laterality: Right;   TUBAL LIGATION     UNILATERAL SALPINGECTOMY Left 04/09/2016   Procedure: UNILATERAL SALPINGECTOMY;  Surgeon: Nadara Mustard, MD;  Location: [facility] ORS;  Service: Gynecology;   Laterality: Left;   UPPER GI ENDOSCOPY  2010   with biopsy of gastric erosion   VAGINAL HYSTERECTOMY N/A 04/09/2016   Procedure: HYSTERECTOMY VAGINAL;  Surgeon: Nadara Mustard, MD;  Location: [facility] ORS;  Service: Gynecology;  Laterality: N/A;   VAGINAL HYSTERECTOMY  2017   and bladder tack    Social History   Socioeconomic History   Marital status: Married    Spouse name: Not on file   Number of children: Not on file   Years of education: Not on file   Highest education level: Not on file  Occupational History   Not on file  Tobacco Use   Smoking status: Former    Current packs/day: 0.00    Types: Cigarettes    Quit date: 50    Years since quitting: 57.7   Smokeless tobacco: Never  Vaping Use   Vaping status: Never Used  Substance and Sexual Activity   Alcohol use: Yes    Comment: occasionally   Drug use: No   Sexual activity: Not Currently  Other Topics Concern   Not on file  Social History Narrative   Not on file   Social Determinants of Health   Financial Resource Strain: Not on file  Food Insecurity: No Food Insecurity (04/21/2023)   Hunger Vital Sign    Worried About Running Out of Food in the Last Year: Never true    Ran Out of Food in the Last Year: Never true  Transportation Needs: No Transportation Needs (04/21/2023)   PRAPARE - Administrator, Civil Service (Medical): No    Lack of Transportation (Non-Medical): No  Physical Activity: Not on file  Stress: Not on file  Social Connections: Not on file  Intimate Partner Violence: Not At Risk (04/21/2023)   Humiliation, Afraid, Rape, and Kick questionnaire    Fear of Current or Ex-Partner: No    Emotionally Abused: No    Physically Abused: No    Sexually Abused: No     Family History  Problem Relation Age of Onset   Pulmonary fibrosis Mother    Melanoma Father    COPD Sister    Cardiomyopathy Sister        and arrhythmia   Atrial fibrillation Sister    COPD Brother    Pulmonary  fibrosis Brother    Cancer Brother    Breast cancer Cousin        paternal 1st     Current Outpatient Medications:    acetaminophen (TYLENOL) 500 MG tablet, Take 1,000 mg by mouth 2 (two) times daily as needed for moderate pain., Disp: , Rfl:    albuterol (VENTOLIN HFA) 108 (90 Base) MCG/ACT inhaler, Inhale 2 puffs into the lungs every 6 (six) hours as needed for wheezing or shortness of breath., Disp: 8 g, Rfl: 3   aspirin EC 81 MG tablet, Take 81 mg by mouth at bedtime. , Disp: , Rfl:    BLACK ELDERBERRY PO, Take 1 Dose by mouth 2 (two) times a day., Disp: , Rfl:    Calcium Carbonate-Vitamin D 600-400 MG-UNIT tablet, Take 1 tablet by mouth 2 (two) times daily., Disp: , Rfl:    cholecalciferol (VITAMIN D) 1000 UNITS tablet, Take 1,000 Units by mouth at bedtime. , Disp: , Rfl:    citalopram (CELEXA) 20 MG tablet, Take 1 tablet (20 mg total) by mouth daily., Disp: 90 tablet, Rfl: 1   cyanocobalamin 500 MCG tablet, Take 500 mcg by mouth daily., Disp: , Rfl:    Dextran 70-Hypromellose (ARTIFICIAL TEARS) 0.1-0.3 % SOLN, Apply 1-2 drops to eye daily., Disp: , Rfl:    diazepam (VALIUM) 5 MG tablet, Take 1-2 tablets (5-10 mg total) by mouth once as needed for up to 1 dose (anxiety due to medical procedure)., Disp: 2 tablet, Rfl: 0   diclofenac Sodium (VOLTAREN) 1 % GEL, Apply 4 g topically 4 (four) times daily., Disp: 500 g, Rfl: 3   diltiazem (CARDIZEM CD) 120 MG 24 hr capsule, Take 180 mg by mouth at bedtime., Disp: , Rfl: 3   diphenhydrAMINE (BENADRYL) 25 MG tablet, Take 25-50 mg by mouth daily as needed for itching., Disp: , Rfl:    docusate sodium (COLACE) 100 MG capsule, Take 200 mg by mouth at bedtime as needed for moderate constipation. , Disp: , Rfl:    EPINEPHrine (EPIPEN 2-PAK) 0.3  mg/0.3 mL IJ SOAJ injection, use as directed for severe allergy reaction, Disp: 2 each, Rfl: 1   fenofibrate 160 MG tablet, TAKE 1 TABLET DAILY FOR CHOLESTEROL, Disp: 90 tablet, Rfl: 0   fluticasone (FLONASE)  50 MCG/ACT nasal spray, Use 2 sprays in each nostril daily,, Disp: 16 g, Rfl: 2   folic acid (FOLVITE) 400 MCG tablet, Take 400 mcg by mouth daily., Disp: , Rfl:    guaiFENesin (MUCINEX) 600 MG 12 hr tablet, Take 600 mg by mouth at bedtime as needed (congestion)., Disp: , Rfl:    Hypromellose (ARTIFICIAL TEARS OP), Place 1-2 drops into both eyes daily as needed (dry eyes)., Disp: , Rfl:    ketotifen (ZADITOR) 0.025 % ophthalmic solution, Place 1 drop into both eyes 2 (two) times daily as needed (allergies)., Disp: , Rfl:    levocetirizine (XYZAL) 5 MG tablet, Take 1 tablet (5 mg total) by mouth every evening., Disp: 90 tablet, Rfl: 0   levothyroxine (SYNTHROID, LEVOTHROID) 50 MCG tablet, Take 50 mcg by mouth daily before breakfast. Brand Name only, Disp: , Rfl:    losartan (COZAAR) 25 MG tablet, Take 25 mg by mouth daily., Disp: , Rfl:    Magnesium 250 MG TABS, Take 1 tablet by mouth daily., Disp: , Rfl:    Melatonin 5 MG TABS, Take 1 tablet by mouth at bedtime. For sleep, Disp: , Rfl:    metoprolol tartrate (LOPRESSOR) 25 MG tablet, Take 12.5 mg by mouth daily., Disp: , Rfl:    Multiple Vitamins-Minerals (MULTIVITAMIN WITH MINERALS) tablet, Take 1 tablet by mouth daily., Disp: , Rfl:    OXYGEN, Inhale into the lungs. 2 litre at night, Disp: , Rfl:    pantoprazole (PROTONIX) 40 MG tablet, Take 1 tablet (40 mg total) by mouth daily., Disp: 90 tablet, Rfl: 1   pyridoxine (B-6) 100 MG tablet, Take 100 mg by mouth daily., Disp: , Rfl:    raloxifene (EVISTA) 60 MG tablet, Take 60 mg by mouth daily., Disp: , Rfl:    rosuvastatin (CRESTOR) 5 MG tablet, Take 1 tablet (5 mg total) by mouth daily., Disp: 90 tablet, Rfl: 3   sodium chloride (OCEAN) 0.65 % SOLN nasal spray, Place 1 spray into both nostrils 2 (two) times daily., Disp: , Rfl:    sucralfate (CARAFATE) 1 g tablet, Take 3 times daily with meals., Disp: 90 tablet, Rfl: 2   Vitamin A 7.5 MG (25000 UT) CAPS, Take 1 tablet by mouth 2 (two) times  daily., Disp: , Rfl:    zinc gluconate 50 MG tablet, Take 50 mg by mouth daily., Disp: , Rfl:    Physical exam:  Vitals:   04/21/23 1425  BP: (!) 140/72  Pulse: 77  Resp: 18  Temp: 98.1 F (36.7 C)  TempSrc: Tympanic  SpO2: 97%  Weight: 149 lb 12.8 oz (67.9 kg)   Physical Exam Cardiovascular:     Rate and Rhythm: Normal rate and regular rhythm.     Heart sounds: Normal heart sounds.  Pulmonary:     Effort: Pulmonary effort is normal.     Breath sounds: Normal breath sounds.  Abdominal:     General: Bowel sounds are normal.     Palpations: Abdomen is soft.  Skin:    General: Skin is warm and dry.  Neurological:     Mental Status: She is alert and oriented to person, place, and time.           Latest Ref Rng & Units 10/06/2022  9:08 AM  CMP  Glucose 70 - 99 mg/dL 96   BUN 8 - 27 mg/dL 15   Creatinine 7.82 - 1.00 mg/dL 9.56   Sodium 213 - 086 mmol/L 136   Potassium 3.5 - 5.2 mmol/L 4.9   Chloride 96 - 106 mmol/L 98   CO2 20 - 29 mmol/L 23   Calcium 8.7 - 10.3 mg/dL 9.7   Total Protein 6.0 - 8.5 g/dL 6.6   Total Bilirubin 0.0 - 1.2 mg/dL 0.3   Alkaline Phos 44 - 121 IU/L 62   AST 0 - 40 IU/L 20   ALT 0 - 32 IU/L 20       Latest Ref Rng & Units 04/08/2023    3:22 PM  CBC  WBC 3.4 - 10.8 x10E3/uL 14.3   Hemoglobin 11.1 - 15.9 g/dL 57.8   Hematocrit 46.9 - 46.6 % 35.2   Platelets 150 - 450 x10E3/uL 413     No images are attached to the encounter.  MM 3D SCREENING MAMMOGRAM BILATERAL BREAST  Result Date: 04/05/2023 CLINICAL DATA:  Screening. EXAM: DIGITAL SCREENING BILATERAL MAMMOGRAM WITH TOMOSYNTHESIS AND CAD TECHNIQUE: Bilateral screening digital craniocaudal and mediolateral oblique mammograms were obtained. Bilateral screening digital breast tomosynthesis was performed. The images were evaluated with computer-aided detection. COMPARISON:  Previous exam(s). ACR Breast Density Category b: There are scattered areas of fibroglandular density. FINDINGS:  There are no findings suspicious for malignancy. IMPRESSION: No mammographic evidence of malignancy. A result letter of this screening mammogram will be mailed directly to the patient. RECOMMENDATION: Screening mammogram in one year. (Code:SM-B-01Y) BI-RADS CATEGORY  1: Negative. Electronically Signed   By: Ted Mcalpine M.D.   On: 04/05/2023 17:23    Assessment and plan- Patient is a 77 y.o. female furred for iron deficiency anemia  Discussed with the patient that she is mildly anemic with a hemoglobin of 11.2.  Although her ferritin levels were normal at 56 she had an elevated TIBC and low iron saturation of 12% indicative of iron deficiency.  Would be reasonable to offer her IV iron at this time.  We will give her 1 dose of Monoferric.  Discussed risks and benefits of IV iron including all but not related to possible risk of infusion reaction.  Patient understands and agrees to proceed as planned.  CBC ferritin and iron studies B12 and folate in 2 months and I will see her thereafter   Thank you for this kind referral and the opportunity to participate in the care of this  Patient   Visit Diagnosis 1. Iron deficiency anemia, unspecified iron deficiency anemia type     Dr. Owens Shark, MD, MPH [facility] [phone] 04/22/2023

## 2023-04-27 DIAGNOSIS — D72829 Elevated white blood cell count, unspecified: Secondary | ICD-10-CM | POA: Diagnosis not present

## 2023-04-27 DIAGNOSIS — M5416 Radiculopathy, lumbar region: Secondary | ICD-10-CM | POA: Diagnosis not present

## 2023-04-27 DIAGNOSIS — M48062 Spinal stenosis, lumbar region with neurogenic claudication: Secondary | ICD-10-CM | POA: Diagnosis not present

## 2023-04-27 DIAGNOSIS — M5136 Other intervertebral disc degeneration, lumbar region: Secondary | ICD-10-CM | POA: Diagnosis not present

## 2023-04-28 ENCOUNTER — Telehealth: Payer: Self-pay

## 2023-04-28 NOTE — Telephone Encounter (Signed)
-----   Message from Vanessa Evans sent at 04/28/2023  9:16 AM EDT ----- Please let her know that her WBC is back to normal

## 2023-04-28 NOTE — Telephone Encounter (Signed)
Patient notified

## 2023-04-28 NOTE — Telephone Encounter (Signed)
-----   Message from Carlean Jews sent at 04/28/2023  9:16 AM EDT ----- Please let her know that her WBC is back to normal

## 2023-04-29 ENCOUNTER — Inpatient Hospital Stay: Payer: Medicare Other

## 2023-04-29 VITALS — BP 128/60 | HR 72 | Temp 96.8°F | Resp 18

## 2023-04-29 DIAGNOSIS — D509 Iron deficiency anemia, unspecified: Secondary | ICD-10-CM | POA: Diagnosis not present

## 2023-04-29 DIAGNOSIS — I10 Essential (primary) hypertension: Secondary | ICD-10-CM | POA: Diagnosis not present

## 2023-04-29 MED ORDER — SODIUM CHLORIDE 0.9 % IV SOLN
Freq: Once | INTRAVENOUS | Status: AC
  Filled 2023-04-29: qty 250

## 2023-04-29 MED ORDER — SODIUM CHLORIDE 0.9 % IV SOLN
1000.0000 mg | Freq: Once | INTRAVENOUS | Status: AC
  Administered 2023-04-29: 1000 mg via INTRAVENOUS
  Filled 2023-04-29: qty 10

## 2023-04-29 NOTE — Patient Instructions (Signed)

## 2023-05-04 ENCOUNTER — Ambulatory Visit (INDEPENDENT_AMBULATORY_CARE_PROVIDER_SITE_OTHER): Payer: Medicare Other | Admitting: Podiatry

## 2023-05-04 DIAGNOSIS — Z91199 Patient's noncompliance with other medical treatment and regimen due to unspecified reason: Secondary | ICD-10-CM

## 2023-05-07 DIAGNOSIS — M5416 Radiculopathy, lumbar region: Secondary | ICD-10-CM | POA: Diagnosis not present

## 2023-05-07 DIAGNOSIS — M48062 Spinal stenosis, lumbar region with neurogenic claudication: Secondary | ICD-10-CM | POA: Diagnosis not present

## 2023-05-10 ENCOUNTER — Ambulatory Visit: Payer: Medicare Other | Admitting: Internal Medicine

## 2023-05-10 ENCOUNTER — Encounter: Payer: Self-pay | Admitting: Internal Medicine

## 2023-05-10 VITALS — BP 140/70 | HR 86 | Temp 98.1°F | Resp 16 | Ht 61.0 in | Wt 150.4 lb

## 2023-05-10 DIAGNOSIS — R0602 Shortness of breath: Secondary | ICD-10-CM

## 2023-05-10 DIAGNOSIS — K219 Gastro-esophageal reflux disease without esophagitis: Secondary | ICD-10-CM | POA: Diagnosis not present

## 2023-05-10 DIAGNOSIS — J4520 Mild intermittent asthma, uncomplicated: Secondary | ICD-10-CM | POA: Diagnosis not present

## 2023-05-10 NOTE — Patient Instructions (Signed)
Asthma, Adult  Asthma is a condition that causes swelling and narrowing of the airways. These are the passages that lead from the nose and mouth down into the lungs. When asthma symptoms get worse it is called an asthma attack or flare. This can make it hard to breathe. Asthma flares can range from minor to life-threatening. There is no cure for asthma, but medicines and lifestyle changes can help to control it. What are the causes? It is not known exactly what causes asthma, but certain things can cause asthma symptoms to get worse (triggers). What can trigger an asthma attack? Cigarette smoke. Mold. Dust. Your pet's skin flakes (dander). Cockroaches. Pollen. Air pollution (like household cleaners, wood smoke, smog, or Therapist, occupational). What are the signs or symptoms? Trouble breathing (shortness of breath). Coughing. Making high-pitched whistling sounds when you breathe, most often when you breathe out (wheezing). Chest tightness. Tiredness with little activity. Poor exercise tolerance. How is this treated? Controller medicines that help prevent asthma symptoms. Fast-acting reliever or rescue medicines. These give short-term relief of asthma symptoms. Allergy medicines if your attacks are brought on by allergens. Medicines to help control the body's defense (immune) system. Staying away from the things that cause asthma attacks. Follow these instructions at home: Avoiding triggers in your home Do not allow anyone to smoke in your home. Limit use of fireplaces and wood stoves. Get rid of pests (such as roaches and mice) and their droppings. Keep your home clean. Clean your floors. Dust regularly. Use cleaning products that do not smell. Wash bed sheets and blankets every week in hot water. Dry them in a dryer. Have someone vacuum when you are not home. Change your heating and air conditioning filters often. Use blankets that are made of polyester or cotton. General  instructions Take over-the-counter and prescription medicines only as told by your doctor. Do not smoke or use any products that contain nicotine or tobacco. If you need help quitting, ask your doctor. Stay away from secondhand smoke. Avoid doing things outdoors when allergen counts are high and when air quality is low. Warm up before you exercise. Take time to cool down after exercise. Use a peak flow meter as told by your doctor. A peak flow meter is a tool that measures how well your lungs are working. Keep track of the peak flow meter's readings. Write them down. Follow your asthma action plan. This is a written plan for taking care of your asthma and treating your attacks. Make sure you get all the shots (vaccines) that your doctor recommends. Ask your doctor about a flu shot and a pneumonia shot. Keep all follow-up visits. Contact a doctor if: You have wheezing, shortness of breath, or a cough even while taking medicine to prevent attacks. The mucus you cough up (sputum) is thicker than usual. The mucus you cough up changes from clear or white to yellow, green, gray, or is bloody. You have problems from the medicine you are taking, such as: A rash. Itching. Swelling. Trouble breathing. You need reliever medicines more than 2-3 times a week. Your peak flow reading is still at 50-79% of your personal best after following the action plan for 1 hour. You have a fever. Get help right away if: You seem to be worse and are not responding to medicine during an asthma attack. You are short of breath even at rest. You get short of breath when doing very little activity. You have trouble eating, drinking, or talking. You have chest  pain or tightness. You have a fast heartbeat. Your lips or fingernails start to turn blue. You are light-headed or dizzy, or you faint. Your peak flow is less than 50% of your personal best. You feel too tired to breathe normally. These symptoms may be an  emergency. Get help right away. Call 911. Do not wait to see if the symptoms will go away. Do not drive yourself to the hospital. Summary Asthma is a long-term (chronic) condition in which the airways get tight and narrow. An asthma attack can make it hard to breathe. Asthma cannot be cured, but medicines and lifestyle changes can help control it. Make sure you understand how to avoid triggers and how and when to use your medicines. Avoid things that can cause allergy symptoms (allergens). These include animal skin flakes (dander) and pollen from trees or grass. Avoid things that pollute the air. These may include household cleaners, wood smoke, smog, or chemical odors. This information is not intended to replace advice given to you by your health care provider. Make sure you discuss any questions you have with your health care provider. Document Revised: 05/05/2021 Document Reviewed: 05/05/2021 Elsevier Patient Education  2024 [employer].

## 2023-05-10 NOTE — Progress Notes (Signed)
[facility] [address] [place], [place] [address]  Pulmonary Sleep Medicine   Office Visit Note  Patient Name: Vanessa Evans DOB: 1946-03-17 MRN [id number]  Date of Service: 05/10/2023  Complaints/HPI: She has noted a little more shortness of breath from baseline and has noted that she has some cough. She has noted sputum every so often. Some wheeze is noted. She is using her inhalers 2-3x per day. She states her symptoms are present in the daytime. She is using her oxygen at night time seems to be helping. She only has albuterol and may benefit from a LABA combo inhaler  Office Spirometry Results: Peak Flow: (!) 5 L/min FEV1: 1.45 liters FVC: 1.78 liters FEV1/FVC: 81.5 % FVC  % Predicted: 76 % FeF 25-75: 1.68 liters FeF 25-75 % Predicted: 125   ROS  General: (-) fever, (-) chills, (-) night sweats, (-) weakness Skin: (-) rashes, (-) itching,. Eyes: (-) visual changes, (-) redness, (-) itching. Nose and Sinuses: (-) nasal stuffiness or itchiness, (-) postnasal drip, (-) nosebleeds, (-) sinus trouble. Mouth and Throat: (-) sore throat, (-) hoarseness. Neck: (-) swollen glands, (-) enlarged thyroid, (-) neck pain. Respiratory: + cough, (-) bloody sputum, + shortness of breath, + wheezing. Cardiovascular: - ankle swelling, (-) chest pain. Lymphatic: (-) lymph node enlargement. Neurologic: (-) numbness, (-) tingling. Psychiatric: (-) anxiety, (-) depression   Current Medication: Outpatient Encounter Medications as of 05/10/2023  Medication Sig   acetaminophen (TYLENOL) 500 MG tablet Take 1,000 mg by mouth 2 (two) times daily as needed for moderate pain.   albuterol (VENTOLIN HFA) 108 (90 Base) MCG/ACT inhaler Inhale 2 puffs into the lungs every 6 (six) hours as needed for wheezing or shortness of breath.   aspirin EC 81 MG tablet Take 81 mg by mouth at bedtime.    BLACK ELDERBERRY PO Take 1 Dose by mouth 2 (two) times a day.   Calcium Carbonate-Vitamin D  600-400 MG-UNIT tablet Take 1 tablet by mouth 2 (two) times daily.   cholecalciferol (VITAMIN D) 1000 UNITS tablet Take 1,000 Units by mouth at bedtime.    citalopram (CELEXA) 20 MG tablet Take 1 tablet (20 mg total) by mouth daily.   cyanocobalamin 500 MCG tablet Take 500 mcg by mouth daily.   Dextran 70-Hypromellose (ARTIFICIAL TEARS) 0.1-0.3 % SOLN Apply 1-2 drops to eye daily.   diazepam (VALIUM) 5 MG tablet Take 1-2 tablets (5-10 mg total) by mouth once as needed for up to 1 dose (anxiety due to medical procedure).   diclofenac Sodium (VOLTAREN) 1 % GEL Apply 4 g topically 4 (four) times daily.   diltiazem (CARDIZEM CD) 120 MG 24 hr capsule Take 180 mg by mouth at bedtime.   diphenhydrAMINE (BENADRYL) 25 MG tablet Take 25-50 mg by mouth daily as needed for itching.   docusate sodium (COLACE) 100 MG capsule Take 200 mg by mouth at bedtime as needed for moderate constipation.    EPINEPHrine (EPIPEN 2-PAK) 0.3 mg/0.3 mL IJ SOAJ injection use as directed for severe allergy reaction   fenofibrate 160 MG tablet TAKE 1 TABLET DAILY FOR CHOLESTEROL   fluticasone (FLONASE) 50 MCG/ACT nasal spray Use 2 sprays in each nostril daily,   folic acid (FOLVITE) 400 MCG tablet Take 400 mcg by mouth daily.   guaiFENesin (MUCINEX) 600 MG 12 hr tablet Take 600 mg by mouth at bedtime as needed (congestion).   Hypromellose (ARTIFICIAL TEARS OP) Place 1-2 drops into both eyes daily as needed (dry eyes).  ketotifen (ZADITOR) 0.025 % ophthalmic solution Place 1 drop into both eyes 2 (two) times daily as needed (allergies).   levocetirizine (XYZAL) 5 MG tablet Take 1 tablet (5 mg total) by mouth every evening.   levothyroxine (SYNTHROID, LEVOTHROID) 50 MCG tablet Take 50 mcg by mouth daily before breakfast. Brand Name only   losartan (COZAAR) 25 MG tablet Take 25 mg by mouth daily.   Magnesium 250 MG TABS Take 1 tablet by mouth daily.   Melatonin 5 MG TABS Take 1 tablet by mouth at bedtime. For sleep   metoprolol  tartrate (LOPRESSOR) 25 MG tablet Take 12.5 mg by mouth daily.   Multiple Vitamins-Minerals (MULTIVITAMIN WITH MINERALS) tablet Take 1 tablet by mouth daily.   OXYGEN Inhale into the lungs. 2 litre at night   pantoprazole (PROTONIX) 40 MG tablet Take 1 tablet (40 mg total) by mouth daily.   pyridoxine (B-6) 100 MG tablet Take 100 mg by mouth daily.   raloxifene (EVISTA) 60 MG tablet Take 60 mg by mouth daily.   rosuvastatin (CRESTOR) 5 MG tablet Take 1 tablet (5 mg total) by mouth daily.   sodium chloride (OCEAN) 0.65 % SOLN nasal spray Place 1 spray into both nostrils 2 (two) times daily.   sucralfate (CARAFATE) 1 g tablet Take 3 times daily with meals.   Vitamin A 7.5 MG (25000 UT) CAPS Take 1 tablet by mouth 2 (two) times daily.   zinc gluconate 50 MG tablet Take 50 mg by mouth daily.   No facility-administered encounter medications on file as of 05/10/2023.    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 cope.  all benign.  inflammatory process going on in bladder   BREAST BIOPSY Right    benign   BREAST SURGERY Right    lumpectomy   CATARACT EXTRACTION W/PHACO Left 12/25/2014   Procedure: CATARACT EXTRACTION PHACO AND INTRAOCULAR LENS PLACEMENT (IOC);  Surgeon: Galen Manila, MD;  Location: [facility] ORS;  Service: Ophthalmology;  Laterality: Left;  [place] 00:32 AP% 20.0 CDE 6.49   COLONOSCOPY W/ BIOPSIES     COLONOSCOPY W/ POLYPECTOMY  2002, 2004, 2005   adenomatous polyps removed   COLONOSCOPY WITH PROPOFOL N/A 08/23/2017   Procedure: COLONOSCOPY WITH PROPOFOL;  Surgeon: Scot Jun, MD;  Location: [facility] ENDOSCOPY;  Service: Endoscopy;  Laterality: N/A;   COLONOSCOPY WITH PROPOFOL N/A 11/08/2017   Procedure: COLONOSCOPY WITH PROPOFOL;  Surgeon: Scot Jun, MD;  Location: [facility]  ENDOSCOPY;  Service: Endoscopy;  Laterality: N/A;   COLONOSCOPY WITH PROPOFOL N/A 06/30/2021   Procedure: COLONOSCOPY WITH PROPOFOL;  Surgeon: Regis Bill, MD;  Location: [facility] ENDOSCOPY;  Service: Endoscopy;  Laterality: N/A;   CYSTOCELE REPAIR N/A 04/09/2016   Procedure: ANTERIOR REPAIR (CYSTOCELE);  Surgeon: Nadara Mustard, MD;  Location: [facility] ORS;  Service: Gynecology;  Laterality: N/A;   DIAGNOSTIC LAPAROSCOPY  2008   removed both ovaries with cysts, tubes and fibroids   DILATION AND CURETTAGE OF UTERUS  1973   ESOPHAGOGASTRODUODENOSCOPY (EGD) WITH PROPOFOL N/A 08/23/2017   Procedure: ESOPHAGOGASTRODUODENOSCOPY (EGD) WITH PROPOFOL;  Surgeon: Scot Jun, MD;  Location: [facility] ENDOSCOPY;  Service: Endoscopy;  Laterality: N/A;   ESOPHAGOGASTRODUODENOSCOPY (EGD) WITH PROPOFOL N/A 06/30/2021   Procedure: ESOPHAGOGASTRODUODENOSCOPY (EGD) WITH PROPOFOL;  Surgeon:  Regis Bill, MD;  Location: [facility] ENDOSCOPY;  Service: Endoscopy;  Laterality: N/A;   EYE SURGERY Bilateral 2016   HERNIA REPAIR Right 2008   Inguinal hernia Repair, ventral hernia repair   IR KYPHO THORACIC WITH BONE BIOPSY  05/12/2022   IR RADIOLOGIST EVAL & MGMT  05/19/2022   IR RADIOLOGIST EVAL & MGMT  05/26/2022   JOINT REPLACEMENT Right 2008   knee   KNEE ARTHROSCOPY Right    LUMBAR LAMINECTOMY/DECOMPRESSION MICRODISCECTOMY Right 03/21/2013   Procedure: Right Lumbar five-Sacral one Laminectomy for Synovial Cyst;  Surgeon: Reinaldo Meeker, MD;  Location: [facility] OR[facility];  Service: Neurosurgery;  Laterality: Right;  right   OOPHORECTOMY     REVERSE SHOULDER ARTHROPLASTY Right 02/22/2018   Procedure: REVERSE SHOULDER ARTHROPLASTY;  Surgeon: Christena Flake, MD;  Location: [facility] ORS;  Service: Orthopedics;  Laterality: Right;   TUBAL LIGATION     UNILATERAL SALPINGECTOMY Left 04/09/2016   Procedure: UNILATERAL SALPINGECTOMY;  Surgeon: Nadara Mustard, MD;  Location: [facility] ORS;  Service: Gynecology;  Laterality: Left;    UPPER GI ENDOSCOPY  2010   with biopsy of gastric erosion   VAGINAL HYSTERECTOMY N/A 04/09/2016   Procedure: HYSTERECTOMY VAGINAL;  Surgeon: Nadara Mustard, MD;  Location: [facility] ORS;  Service: Gynecology;  Laterality: N/A;   VAGINAL HYSTERECTOMY  2017   and bladder tack    Medical History: Past Medical History:  Diagnosis Date   Arthritis    osteo   Asthma    needs rescue inhaler few times a year   Claustrophobia 04/21/2020   Depression    Dizziness    light headed spells but it has been a while   Dysrhythmia    tachycardia.(cardizem). brady during procedures   Fibromyalgia    GERD (gastroesophageal reflux disease)    gastritis   Headache(784.0)    Heart murmur 2019   aortic. dr. Gwen Pounds not worried about this   Hyperlipidemia    Hypertension    Hypothyroidism    Macular degeneration    Migraine    Orthopnea    Pneumonia    long time ago (greater than 5 years ago)   PONV (postoperative nausea and vomiting)    very anxious during cataract surgery. brady during colonoscopy   Shortness of breath    any exertion, cannot lie flat    Family History: Family History  Problem Relation Age of Onset   Pulmonary fibrosis Mother    Melanoma Father    COPD Sister    Cardiomyopathy Sister        and arrhythmia   Atrial fibrillation Sister    COPD Brother    Pulmonary fibrosis Brother    Cancer Brother    Breast cancer Cousin        paternal 1st    Social History: Social History   Socioeconomic History   Marital status: Married    Spouse name: Not on file   Number of children: Not on file   Years of education: Not on file   Highest education level: Not on file  Occupational History   Not on file  Tobacco Use   Smoking status: Former    Current packs/day: 0.00    Types: Cigarettes    Quit date: 1967    Years since quitting: 57.7   Smokeless tobacco: Never  Vaping Use   Vaping status: Never Used  Substance and Sexual Activity   Alcohol use: Yes    Comment:  occasionally   Drug use:  No   Sexual activity: Not Currently  Other Topics Concern   Not on file  Social History Narrative   Not on file   Social Determinants of Health   Financial Resource Strain: Not on file  Food Insecurity: No Food Insecurity (04/21/2023)   Hunger Vital Sign    Worried About Running Out of Food in the Last Year: Never true    Ran Out of Food in the Last Year: Never true  Transportation Needs: No Transportation Needs (04/21/2023)   PRAPARE - Administrator, Civil Service (Medical): No    Lack of Transportation (Non-Medical): No  Physical Activity: Not on file  Stress: Not on file  Social Connections: Not on file  Intimate Partner Violence: Not At Risk (04/21/2023)   Humiliation, Afraid, Rape, and Kick questionnaire    Fear of Current or Ex-Partner: No    Emotionally Abused: No    Physically Abused: No    Sexually Abused: No    Vital Signs: Blood pressure (!) 140/70, pulse 86, temperature 98.1 F (36.7 C), resp. rate 16, height 5\' 1"  (1.549 m), weight 150 lb 6.4 oz (68.2 kg), SpO2 94%, peak flow (!) 5 L/min.  Examination: General Appearance: The patient is well-developed, well-nourished, and in no distress. Skin: Gross inspection of skin unremarkable. Head: normocephalic, no gross deformities. Eyes: no gross deformities noted. ENT: ears appear grossly normal no exudates. Neck: Supple. No thyromegaly. No LAD. Respiratory: no rhonchi noted. Cardiovascular: Normal S1 and S2 without murmur or rub. Extremities: No cyanosis. pulses are equal. Neurologic: Alert and oriented. No involuntary movements.  LABS: Recent Results (from the past 2160 hour(s))  Fe+TIBC+Fer     Status: Abnormal   Collection Time: 04/08/23  3:22 PM  Result Value Ref Range   Total Iron Binding Capacity 497 (H) 250 - 450 ug/dL   UIBC 409 (H) 811 - 914 ug/dL   Iron 61 27 - 782 ug/dL   Iron Saturation 12 (L) 15 - 55 %   Ferritin 56 15 - 150 ng/mL  CBC w/Diff/Platelet      Status: Abnormal   Collection Time: 04/08/23  3:22 PM  Result Value Ref Range   WBC 14.3 (H) 3.4 - 10.8 x10E3/uL   RBC 4.00 3.77 - 5.28 x10E6/uL   Hemoglobin 11.2 11.1 - 15.9 g/dL   Hematocrit 95.6 21.3 - 46.6 %   MCV 88 79 - 97 fL   MCH 28.0 26.6 - 33.0 pg   MCHC 31.8 31.5 - 35.7 g/dL   RDW 08.6 57.8 - 46.9 %   Platelets 413 150 - 450 x10E3/uL   Neutrophils 82 Not Estab. %   Lymphs 10 Not Estab. %   Monocytes 7 Not Estab. %   Eos 0 Not Estab. %   Basos 0 Not Estab. %   Neutrophils Absolute 11.8 (H) 1.4 - 7.0 x10E3/uL   Lymphocytes Absolute 1.4 0.7 - 3.1 x10E3/uL   Monocytes Absolute 1.0 (H) 0.1 - 0.9 x10E3/uL   EOS (ABSOLUTE) 0.0 0.0 - 0.4 x10E3/uL   Basophils Absolute 0.0 0.0 - 0.2 x10E3/uL   Immature Granulocytes 1 Not Estab. %   Immature Grans (Abs) 0.1 0.0 - 0.1 x10E3/uL  CBC w/Diff/Platelet     Status: None   Collection Time: 04/27/23 11:21 AM  Result Value Ref Range   WBC 5.1 3.4 - 10.8 x10E3/uL   RBC 4.02 3.77 - 5.28 x10E6/uL   Hemoglobin 11.9 11.1 - 15.9 g/dL   Hematocrit 62.9 52.8 - 46.6 %  MCV 91 79 - 97 fL   MCH 29.6 26.6 - 33.0 pg   MCHC 32.7 31.5 - 35.7 g/dL   RDW 36.6 44.0 - 34.7 %   Platelets 274 150 - 450 x10E3/uL   Neutrophils 53 Not Estab. %   Lymphs 26 Not Estab. %   Monocytes 16 Not Estab. %   Eos 4 Not Estab. %   Basos 1 Not Estab. %   Neutrophils Absolute 2.7 1.4 - 7.0 x10E3/uL   Lymphocytes Absolute 1.3 0.7 - 3.1 x10E3/uL   Monocytes Absolute 0.8 0.1 - 0.9 x10E3/uL   EOS (ABSOLUTE) 0.2 0.0 - 0.4 x10E3/uL   Basophils Absolute 0.1 0.0 - 0.2 x10E3/uL   Immature Granulocytes 0 Not Estab. %   Immature Grans (Abs) 0.0 0.0 - 0.1 x10E3/uL    Radiology: MM 3D SCREENING MAMMOGRAM BILATERAL BREAST  Result Date: 04/05/2023 CLINICAL DATA:  Screening. EXAM: DIGITAL SCREENING BILATERAL MAMMOGRAM WITH TOMOSYNTHESIS AND CAD TECHNIQUE: Bilateral screening digital craniocaudal and mediolateral oblique mammograms were obtained. Bilateral screening digital  breast tomosynthesis was performed. The images were evaluated with computer-aided detection. COMPARISON:  Previous exam(s). ACR Breast Density Category b: There are scattered areas of fibroglandular density. FINDINGS: There are no findings suspicious for malignancy. IMPRESSION: No mammographic evidence of malignancy. A result letter of this screening mammogram will be mailed directly to the patient. RECOMMENDATION: Screening mammogram in one year. (Code:SM-B-01Y) BI-RADS CATEGORY  1: Negative. Electronically Signed   By: Ted Mcalpine M.D.   On: 04/05/2023 17:23    No results found.  No results found.  Assessment and Plan: Patient Active Problem List   Diagnosis Date Noted   Lymphedema 01/18/2022   Coronary artery disease involving native coronary artery of native heart with angina pectoris (HCC) 10/28/2020   Peptic ulcer disease 04/21/2020   Claustrophobia 04/21/2020   Urinary tract infection without hematuria 02/04/2020   Pain in both lower extremities 12/18/2019   Inflammatory polyarthritis (HCC) 07/26/2019   Excessive daytime sleepiness 07/26/2019   Encounter for screening mammogram for malignant neoplasm of breast 07/26/2019   Intercostal muscle pain 06/25/2019   Positive ANA (antinuclear antibody) 05/01/2019   Primary osteoarthritis involving multiple joints 04/26/2019   Mild intermittent asthma without complication 08/22/2018   Seasonal allergic rhinitis due to pollen 08/22/2018   Chills with fever 08/11/2018   Sore throat 08/11/2018   Acute upper respiratory infection 08/11/2018   Candidiasis 08/11/2018   Iron deficiency anemia 05/05/2018   Right hip pain 04/27/2018   Accidental fall from furniture 04/27/2018   Vitamin D deficiency 04/27/2018   Cough 04/18/2018   Dysuria 04/18/2018   Status post reverse total shoulder replacement, right 02/22/2018   Abnormal ECG 02/18/2018   Pain in joint of right shoulder 02/13/2018   Abdominal aortic atherosclerosis (HCC)  02/13/2018   Episode of recurrent major depressive disorder (HCC) 02/13/2018   Encounter for general adult medical examination with abnormal findings 02/13/2018   Lipoma of right shoulder 01/31/2018   Rotator cuff tendinitis, right 01/31/2018   Chronic superficial gastritis without bleeding 12/12/2017   Schatzki's ring 12/12/2017   Acute gastritis 11/08/2017   Nausea 10/18/2017   Other fatigue 10/18/2017   Allergic rhinitis 09/20/2017   Eczema 09/20/2017   Hematuria 09/20/2017   Mixed hyperlipidemia 09/20/2017   Osteoarthritis 09/20/2017   Osteoporosis, post-menopausal 09/20/2017   Palpitations 07/05/2017   Epigastric pain 05/24/2017   Gastroesophageal reflux disease without esophagitis 05/24/2017   Impingement syndrome of left shoulder 10/02/2016   Trochanteric bursitis of left hip  10/02/2016   SOB (shortness of breath) 04/16/2016   Uterine prolapse 04/09/2016   Cystocele 04/09/2016   Hyponatremia 04/09/2016   Benign essential HTN 11/18/2015   Paroxysmal supraventricular tachycardia 11/18/2015   Premature ventricular contraction 07/20/2014   History of colonic polyps 05/29/2014   Asthma 02/05/2014   Chest pain 02/05/2014   Increased frequency of urination 02/05/2014   Tachycardia 02/05/2014   Female stress incontinence 12/14/2013   Gross hematuria 12/14/2013   Incomplete emptying of bladder 12/14/2013   Other chronic cystitis without hematuria 12/14/2013    1. SOB (shortness of breath) She is a little bit more short of breath than normal.  I suggested we do a follow-up spirometry.  Will review the results with her once it is done. - Spirometry with graph  2. Chronic asthma, mild intermittent, uncomplicated Samples of breztri was given hopefully this will improve her symptoms that are breaking through.  3. Gastroesophageal reflux disease without esophagitis Also discussed with her the importance of controlling the reflux symptoms this may be worsening the asthma at this  time.  General Counseling: I have discussed the findings of the evaluation and examination with Nalah.  I have also discussed any further diagnostic evaluation thatmay be needed or ordered today. Imaya verbalizes understanding of the findings of todays visit. We also reviewed her medications today and discussed drug interactions and side effects including but not limited excessive drowsiness and altered mental states. We also discussed that there is always a risk not just to her but also people around her. she has been encouraged to call the office with any questions or concerns that should arise related to todays visit.  Orders Placed This Encounter  Procedures   Spirometry with graph    Order Specific Question:   Where should this test be performed?    Answer:   [facility]     Time spent: 98  I have personally obtained a history, examined the patient, evaluated laboratory and imaging results, formulated the assessment and plan and placed orders.    Yevonne Pax, MD [facility] Pulmonary and Critical Care Sleep medicine

## 2023-05-12 ENCOUNTER — Other Ambulatory Visit: Payer: Self-pay | Admitting: Physician Assistant

## 2023-05-12 DIAGNOSIS — E782 Mixed hyperlipidemia: Secondary | ICD-10-CM

## 2023-05-17 ENCOUNTER — Ambulatory Visit: Payer: Medicare Other | Admitting: Internal Medicine

## 2023-05-17 NOTE — Progress Notes (Signed)
   Complete physical exam  Patient: Vanessa Evans   DOB: 05/30/2000   77 y.o. Female  MRN: [id number]  Subjective:    No chief complaint on file.   Vanessa Evans is a 77 y.o. female who presents today for a complete physical exam. She reports consuming a {diet types:17450} diet. {types:19826} She generally feels {DESC; WELL/FAIRLY WELL/POORLY:18703}. She reports sleeping {DESC; WELL/FAIRLY WELL/POORLY:18703}. She {does/does not:200015} have additional problems to discuss today.    Most recent fall risk assessment:    02/04/2022   10:42 AM  Fall Risk   Falls in the past year? 0  Number falls in past yr: 0  Injury with Fall? 0  Risk for fall due to : No Fall Risks  Follow up Falls evaluation completed     Most recent depression screenings:    02/04/2022   10:42 AM 12/26/2020   10:46 AM  PHQ 2/9 Scores  PHQ - 2 Score 0 0  PHQ- 9 Score 5     {VISON DENTAL STD PSA (Optional):27386}  {History (Optional):23778}  Patient Care Team: Jessup, Joy, NP as PCP - General (Nurse Practitioner)   Outpatient Medications Prior to Visit  Medication Sig   fluticasone (FLONASE) 50 MCG/ACT nasal spray Place 2 sprays into both nostrils in the morning and at bedtime. After 7 days, reduce to once daily.   norgestimate-ethinyl estradiol (SPRINTEC 28) 0.25-35 MG-MCG tablet Take 1 tablet by mouth daily.   Nystatin POWD Apply liberally to affected area 2 times per day   spironolactone (ALDACTONE) 100 MG tablet Take 1 tablet (100 mg total) by mouth daily.   No facility-administered medications prior to visit.    ROS        Objective:     There were no vitals taken for this visit. {Vitals History (Optional):23777}  Physical Exam   No results found for any visits on 03/12/22. {Show previous labs (optional):23779}    Assessment & Plan:    Routine Health Maintenance and Physical Exam  Immunization History  Administered Date(s) Administered   DTaP 08/13/1999, 10/09/1999,  12/18/1999, 09/02/2000, 03/18/2004   Hepatitis A 01/13/2008, 01/18/2009   Hepatitis B 05/31/1999, 07/08/1999, 12/18/1999   HiB (PRP-OMP) 08/13/1999, 10/09/1999, 12/18/1999, 09/02/2000   IPV 08/13/1999, 10/09/1999, 06/07/2000, 03/18/2004   Influenza,inj,Quad PF,6+ Mos 04/20/2014   Influenza-Unspecified 07/20/2012   MMR 06/07/2001, 03/18/2004   Meningococcal Polysaccharide 01/18/2012   Pneumococcal Conjugate-13 09/02/2000   Pneumococcal-Unspecified 12/18/1999, 03/02/2000   Tdap 01/18/2012   Varicella 06/07/2000, 01/13/2008    Health Maintenance  Topic Date Due   HIV Screening  Never done   Hepatitis C Screening  Never done   INFLUENZA VACCINE  03/10/2022   PAP-Cervical Cytology Screening  03/12/2022 (Originally 05/29/2020)   PAP SMEAR-Modifier  03/12/2022 (Originally 05/29/2020)   TETANUS/TDAP  03/12/2022 (Originally 01/17/2022)   HPV VACCINES  Discontinued   COVID-19 Vaccine  Discontinued    Discussed health benefits of physical activity, and encouraged her to engage in regular exercise appropriate for her age and condition.  Problem List Items Addressed This Visit   None Visit Diagnoses     Annual physical exam    -  Primary   Cervical cancer screening       Need for Tdap vaccination          No follow-ups on file.     Joy Jessup, NP   

## 2023-05-18 DIAGNOSIS — Z23 Encounter for immunization: Secondary | ICD-10-CM | POA: Diagnosis not present

## 2023-05-18 DIAGNOSIS — E039 Hypothyroidism, unspecified: Secondary | ICD-10-CM | POA: Diagnosis not present

## 2023-05-18 DIAGNOSIS — R739 Hyperglycemia, unspecified: Secondary | ICD-10-CM | POA: Diagnosis not present

## 2023-05-25 DIAGNOSIS — E039 Hypothyroidism, unspecified: Secondary | ICD-10-CM | POA: Diagnosis not present

## 2023-05-25 DIAGNOSIS — E042 Nontoxic multinodular goiter: Secondary | ICD-10-CM | POA: Diagnosis not present

## 2023-05-25 DIAGNOSIS — R739 Hyperglycemia, unspecified: Secondary | ICD-10-CM | POA: Diagnosis not present

## 2023-05-27 ENCOUNTER — Telehealth: Payer: Self-pay

## 2023-05-27 NOTE — Telephone Encounter (Signed)
Pt called that she was bilateral swelling her legs and ankle as per lauren advised to call her cardiology first and if cannot get touch with then call us back

## 2023-05-28 DIAGNOSIS — R001 Bradycardia, unspecified: Secondary | ICD-10-CM | POA: Diagnosis not present

## 2023-05-28 DIAGNOSIS — R0902 Hypoxemia: Secondary | ICD-10-CM | POA: Diagnosis not present

## 2023-05-28 DIAGNOSIS — R079 Chest pain, unspecified: Secondary | ICD-10-CM | POA: Diagnosis not present

## 2023-05-28 DIAGNOSIS — I25119 Atherosclerotic heart disease of native coronary artery with unspecified angina pectoris: Secondary | ICD-10-CM | POA: Diagnosis not present

## 2023-05-28 DIAGNOSIS — I471 Supraventricular tachycardia, unspecified: Secondary | ICD-10-CM | POA: Diagnosis not present

## 2023-05-28 DIAGNOSIS — R0602 Shortness of breath: Secondary | ICD-10-CM | POA: Diagnosis not present

## 2023-05-28 DIAGNOSIS — R531 Weakness: Secondary | ICD-10-CM | POA: Diagnosis not present

## 2023-05-28 DIAGNOSIS — K219 Gastro-esophageal reflux disease without esophagitis: Secondary | ICD-10-CM | POA: Diagnosis not present

## 2023-05-28 DIAGNOSIS — R600 Localized edema: Secondary | ICD-10-CM | POA: Diagnosis not present

## 2023-05-28 DIAGNOSIS — R002 Palpitations: Secondary | ICD-10-CM | POA: Diagnosis not present

## 2023-05-28 DIAGNOSIS — I10 Essential (primary) hypertension: Secondary | ICD-10-CM | POA: Diagnosis not present

## 2023-05-28 DIAGNOSIS — I2510 Atherosclerotic heart disease of native coronary artery without angina pectoris: Secondary | ICD-10-CM | POA: Diagnosis not present

## 2023-06-03 DIAGNOSIS — M461 Sacroiliitis, not elsewhere classified: Secondary | ICD-10-CM | POA: Diagnosis not present

## 2023-06-03 DIAGNOSIS — M48062 Spinal stenosis, lumbar region with neurogenic claudication: Secondary | ICD-10-CM | POA: Diagnosis not present

## 2023-06-03 DIAGNOSIS — M1612 Unilateral primary osteoarthritis, left hip: Secondary | ICD-10-CM | POA: Diagnosis not present

## 2023-06-03 DIAGNOSIS — M5416 Radiculopathy, lumbar region: Secondary | ICD-10-CM | POA: Diagnosis not present

## 2023-06-03 DIAGNOSIS — M7918 Myalgia, other site: Secondary | ICD-10-CM | POA: Diagnosis not present

## 2023-06-03 DIAGNOSIS — M4807 Spinal stenosis, lumbosacral region: Secondary | ICD-10-CM | POA: Diagnosis not present

## 2023-06-10 ENCOUNTER — Other Ambulatory Visit: Payer: Self-pay | Admitting: Physician Assistant

## 2023-06-10 DIAGNOSIS — F331 Major depressive disorder, recurrent, moderate: Secondary | ICD-10-CM

## 2023-06-21 ENCOUNTER — Ambulatory Visit: Payer: Medicare Other | Admitting: Oncology

## 2023-06-21 ENCOUNTER — Other Ambulatory Visit: Payer: Medicare Other

## 2023-06-22 ENCOUNTER — Encounter: Payer: Self-pay | Admitting: Oncology

## 2023-06-22 ENCOUNTER — Inpatient Hospital Stay: Payer: Medicare Other | Attending: Oncology

## 2023-06-22 ENCOUNTER — Inpatient Hospital Stay (HOSPITAL_BASED_OUTPATIENT_CLINIC_OR_DEPARTMENT_OTHER): Payer: Medicare Other | Admitting: Oncology

## 2023-06-22 VITALS — BP 138/82 | HR 97 | Temp 97.0°F | Resp 18 | Wt 149.1 lb

## 2023-06-22 DIAGNOSIS — E039 Hypothyroidism, unspecified: Secondary | ICD-10-CM | POA: Insufficient documentation

## 2023-06-22 DIAGNOSIS — D509 Iron deficiency anemia, unspecified: Secondary | ICD-10-CM

## 2023-06-22 DIAGNOSIS — M5136 Other intervertebral disc degeneration, lumbar region with discogenic back pain only: Secondary | ICD-10-CM | POA: Diagnosis not present

## 2023-06-22 DIAGNOSIS — R768 Other specified abnormal immunological findings in serum: Secondary | ICD-10-CM | POA: Diagnosis not present

## 2023-06-22 DIAGNOSIS — M150 Primary generalized (osteo)arthritis: Secondary | ICD-10-CM | POA: Diagnosis not present

## 2023-06-22 LAB — IRON AND TIBC
Iron: 107 ug/dL (ref 28–170)
Saturation Ratios: 22 % (ref 10.4–31.8)
TIBC: 477 ug/dL — ABNORMAL HIGH (ref 250–450)
UIBC: 370 ug/dL

## 2023-06-22 LAB — CBC
HCT: 38 % (ref 36.0–46.0)
Hemoglobin: 12.4 g/dL (ref 12.0–15.0)
MCH: 29.7 pg (ref 26.0–34.0)
MCHC: 32.6 g/dL (ref 30.0–36.0)
MCV: 91.1 fL (ref 80.0–100.0)
Platelets: 256 10*3/uL (ref 150–400)
RBC: 4.17 MIL/uL (ref 3.87–5.11)
RDW: 13.6 % (ref 11.5–15.5)
WBC: 6.8 10*3/uL (ref 4.0–10.5)
nRBC: 0 % (ref 0.0–0.2)

## 2023-06-22 LAB — FOLATE: Folate: 25 ng/mL (ref >5.9)

## 2023-06-22 LAB — FERRITIN: Ferritin: 291 ng/mL (ref 11–307)

## 2023-06-22 LAB — VITAMIN B12: Vitamin B-12: 429 pg/mL (ref 180–914)

## 2023-06-22 NOTE — Progress Notes (Signed)
Hematology/Oncology Consult note [facility]  [phone] [phone]  Patient Care Team: Alan Ripper as PCP - General (Physician Assistant) Creig Hines, MD as Consulting Physician (Oncology)   Name of the patient: Vanessa Evans  [id number]  12-11-1945   Date of visit: 06/22/23  Diagnosis-iron deficiency anemia  Chief complaint/ Reason for visit-routine follow-up of iron deficiency anemia  Heme/Onc history:  Patient is a 77 year old female who has seen me back in 2020 for symptoms of iron deficiency and has received IV iron in the past.  Her recent CBC from August 2024 showed H&H of 11.2/35.2.  Ferritin levels were normal at 56 but iron saturation was low at 12% with elevated TIBC of 497.  Patient had an upper endoscopy and colonoscopy in 2022 and she was not deemed to require any further colonoscopies due to her age.  Presently patient reports feeling fatigued.  Denies any blood loss in her stool or urine.  Denies any dark melanotic stools.   Patient received Monoferric x 1 dose in September 2024  Interval history-patient does not report significant improvement in her energy levels despite receiving Monoferric.  She is also undergoing workup for her shortness of breath and fatigue with cardiology  ECOG PS- 1 Pain scale- 0   Review of systems- Review of Systems  Constitutional:  Positive for malaise/fatigue. Negative for chills, fever and weight loss.  HENT:  Negative for congestion, ear discharge and nosebleeds.   Eyes:  Negative for blurred vision.  Respiratory:  Negative for cough, hemoptysis, sputum production, shortness of breath and wheezing.   Cardiovascular:  Negative for chest pain, palpitations, orthopnea and claudication.  Gastrointestinal:  Negative for abdominal pain, blood in stool, constipation, diarrhea, heartburn, melena, nausea and vomiting.  Genitourinary:  Negative for dysuria, flank pain,  frequency, hematuria and urgency.  Musculoskeletal:  Negative for back pain, joint pain and myalgias.  Skin:  Negative for rash.  Neurological:  Negative for dizziness, tingling, focal weakness, seizures, weakness and headaches.  Endo/Heme/Allergies:  Does not bruise/bleed easily.  Psychiatric/Behavioral:  Negative for depression and suicidal ideas. The patient does not have insomnia.       Allergies  Allergen Reactions   Codeine Shortness Of Breath and Itching    Bronchospasms and asthma attach   Levofloxacin     Altered mental status": drunk" feeling, confused, speech problems Other reaction(s): Unknown    Oxycodone Itching and Shortness Of Breath    Would take benadryl to eliminate itching. Has had bronchospasm   Ultram [Tramadol] Itching and Other (See Comments)    Bronchospasm and asthma attack   Nitrofurantoin Diarrhea    Tried again and had no problems with it Severe and long lasting   Nsaids Other (See Comments)    Bloody stools, abdominal pain, ulcer History of gastritis   Sulfacetamide Rash    Fever, abdominal pain   Adhesive [Tape] Other (See Comments)    Thin skin. Causes tears. Paper tape okay   Azithromycin     Unsure. Patient does not remember but thinks that it did not work for her.   Ciprofloxacin Other (See Comments)    Severe pain in neck and down back    Ketorolac Itching   Sulfa Antibiotics Itching, Rash and Other (See Comments)    fever   Tolmetin     Other reaction(s): Other (See Comments) Bloody stools, abdominal pain History of chronic gastritis   Zofran [Ondansetron Hcl] Other (See Comments)  constipation     Past Medical History:  Diagnosis Date   Arthritis    osteo   Asthma    needs rescue inhaler few times a year   Claustrophobia 04/21/2020   Depression    Dizziness    light headed spells but it has been a while   Dysrhythmia    tachycardia.(cardizem). brady during procedures   Fibromyalgia    GERD (gastroesophageal reflux  disease)    gastritis   Headache(784.0)    Heart murmur 2019   aortic. dr. Gwen Pounds not worried about this   Hyperlipidemia    Hypertension    Hypothyroidism    Macular degeneration    Migraine    Orthopnea    Pneumonia    long time ago (greater than 5 years ago)   PONV (postoperative nausea and vomiting)    very anxious during cataract surgery. 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 cope.  all benign.  inflammatory process going on in bladder   BREAST BIOPSY Right    benign   BREAST SURGERY Right    lumpectomy   CATARACT EXTRACTION W/PHACO Left 12/25/2014   Procedure: CATARACT EXTRACTION PHACO AND INTRAOCULAR LENS PLACEMENT (IOC);  Surgeon: Galen Manila, MD;  Location: [facility] ORS;  Service: Ophthalmology;  Laterality: Left;  [place] 00:32 AP% 20.0 CDE 6.49   COLONOSCOPY W/ BIOPSIES     COLONOSCOPY W/ POLYPECTOMY  2002, 2004, 2005   adenomatous polyps removed   COLONOSCOPY WITH PROPOFOL N/A 08/23/2017   Procedure: COLONOSCOPY WITH PROPOFOL;  Surgeon: Scot Jun, MD;  Location: [facility] ENDOSCOPY;  Service: Endoscopy;  Laterality: N/A;   COLONOSCOPY WITH PROPOFOL N/A 11/08/2017   Procedure: COLONOSCOPY WITH PROPOFOL;  Surgeon: Scot Jun, MD;  Location: [facility]COPY;  Service: Endoscopy;  Laterality: N/A;   COLONOSCOPY WITH PROPOFOL N/A 06/30/2021   Procedure: COLONOSCOPY WITH PROPOFOL;  Surgeon: Regis Bill, MD;  Location: [facility] ENDOSCOPY;  Service: Endoscopy;  Laterality: N/A;   CYSTOCELE REPAIR N/A 04/09/2016   Procedure: ANTERIOR REPAIR (CYSTOCELE);  Surgeon: Nadara Mustard, MD;  Location: [facility] ORS;  Service: Gynecology;  Laterality: N/A;   DIAGNOSTIC LAPAROSCOPY  2008   removed both ovaries with  cysts, tubes and fibroids   DILATION AND CURETTAGE OF UTERUS  1973   ESOPHAGOGASTRODUODENOSCOPY (EGD) WITH PROPOFOL N/A 08/23/2017   Procedure: ESOPHAGOGASTRODUODENOSCOPY (EGD) WITH PROPOFOL;  Surgeon: Scot Jun, MD;  Location: [facility] ENDOSCOPY;  Service: Endoscopy;  Laterality: N/A;   ESOPHAGOGASTRODUODENOSCOPY (EGD) WITH PROPOFOL N/A 06/30/2021   Procedure: ESOPHAGOGASTRODUODENOSCOPY (EGD) WITH PROPOFOL;  Surgeon: Regis Bill, MD;  Location: [facility] ENDOSCOPY;  Service: Endoscopy;  Laterality: N/A;   EYE SURGERY Bilateral 2016   HERNIA REPAIR Right 2008   Inguinal hernia Repair, ventral hernia repair   IR KYPHO THORACIC WITH BONE BIOPSY  05/12/2022   IR RADIOLOGIST EVAL & MGMT  05/19/2022   IR RADIOLOGIST EVAL & MGMT  05/26/2022   JOINT REPLACEMENT Right 2008   knee   KNEE ARTHROSCOPY Right    LUMBAR LAMINECTOMY/DECOMPRESSION MICRODISCECTOMY Right 03/21/2013   Procedure: Right Lumbar five-Sacral one Laminectomy for Synovial Cyst;  Surgeon: Reinaldo Meeker, MD;  Location: [facility]URO ORS;  Service: Neurosurgery;  Laterality: Right;  right   OOPHORECTOMY     REVERSE SHOULDER ARTHROPLASTY Right 02/22/2018   Procedure: REVERSE SHOULDER ARTHROPLASTY;  Surgeon: Christena Flake, MD;  Location: [facility] ORS;  Service: Orthopedics;  Laterality: Right;   TUBAL LIGATION     UNILATERAL SALPINGECTOMY Left 04/09/2016   Procedure: UNILATERAL SALPINGECTOMY;  Surgeon: Nadara Mustard, MD;  Location: [facility] ORS;  Service: Gynecology;  Laterality: Left;   UPPER GI ENDOSCOPY  2010   with biopsy of gastric erosion   VAGINAL HYSTERECTOMY N/A 04/09/2016   Procedure: HYSTERECTOMY VAGINAL;  Surgeon: Nadara Mustard, MD;  Location: [facility] ORS;  Service: Gynecology;  Laterality: N/A;   VAGINAL HYSTERECTOMY  2017   and bladder tack    Social History   Socioeconomic History   Marital status: Married    Spouse name: Not on file   Number of children: Not on file   Years of education: Not on file   Highest education  level: Not on file  Occupational History   Not on file  Tobacco Use   Smoking status: Former    Current packs/day: 0.00    Types: Cigarettes    Quit date: 93    Years since quitting: 57.9   Smokeless tobacco: Never  Vaping Use   Vaping status: Never Used  Substance and Sexual Activity   Alcohol use: Yes    Comment: occasionally   Drug use: No   Sexual activity: Not Currently  Other Topics Concern   Not on file  Social History Narrative   Not on file   Social Determinants of Health   Financial Resource Strain: Not on file  Food Insecurity: No Food Insecurity (04/21/2023)   Hunger Vital Sign    Worried About Running Out of Food in the Last Year: Never true    Ran Out of Food in the Last Year: Never true  Transportation Needs: No Transportation Needs (04/21/2023)   PRAPARE - Administrator, Civil Service (Medical): No    Lack of Transportation (Non-Medical): No  Physical Activity: Not on file  Stress: Not on file  Social Connections: Not on file  Intimate Partner Violence: Not At Risk (04/21/2023)   Humiliation, Afraid, Rape, and Kick questionnaire    Fear of Current or Ex-Partner: No    Emotionally Abused: No    Physically Abused: No    Sexually Abused: No    Family History  Problem Relation Age of Onset   Pulmonary fibrosis Mother    Melanoma Father    COPD Sister    Cardiomyopathy Sister        and arrhythmia   Atrial fibrillation Sister    COPD Brother    Pulmonary fibrosis Brother    Cancer Brother    Breast cancer Cousin        paternal 1st     Current Outpatient Medications:    acetaminophen (TYLENOL) 500 MG tablet, Take 1,000 mg by mouth 2 (two) times daily as needed for moderate pain., Disp: , Rfl:    albuterol (VENTOLIN HFA) 108 (90 Base) MCG/ACT inhaler, Inhale 2 puffs into the lungs every 6 (six) hours as needed for wheezing or shortness of breath., Disp: 8 g, Rfl: 3   aspirin EC 81 MG tablet, Take 81 mg by mouth at bedtime. , Disp: ,  Rfl:    BLACK ELDERBERRY PO, Take 1 Dose by mouth 2 (two) times a day., Disp: , Rfl:  Calcium Carbonate-Vitamin D 600-400 MG-UNIT tablet, Take 1 tablet by mouth 2 (two) times daily., Disp: , Rfl:    cholecalciferol (VITAMIN D) 1000 UNITS tablet, Take 1,000 Units by mouth at bedtime. , Disp: , Rfl:    citalopram (CELEXA) 20 MG tablet, Take 1 tablet (20 mg total) by mouth daily., Disp: 90 tablet, Rfl: 0   cyanocobalamin 500 MCG tablet, Take 500 mcg by mouth daily., Disp: , Rfl:    Dextran 70-Hypromellose (ARTIFICIAL TEARS) 0.1-0.3 % SOLN, Apply 1-2 drops to eye daily., Disp: , Rfl:    diazepam (VALIUM) 5 MG tablet, Take 1-2 tablets (5-10 mg total) by mouth once as needed for up to 1 dose (anxiety due to medical procedure)., Disp: 2 tablet, Rfl: 0   diclofenac Sodium (VOLTAREN) 1 % GEL, Apply 4 g topically 4 (four) times daily., Disp: 500 g, Rfl: 3   diltiazem (CARDIZEM CD) 120 MG 24 hr capsule, Take 180 mg by mouth at bedtime., Disp: , Rfl: 3   diphenhydrAMINE (BENADRYL) 25 MG tablet, Take 25-50 mg by mouth daily as needed for itching., Disp: , Rfl:    docusate sodium (COLACE) 100 MG capsule, Take 200 mg by mouth at bedtime as needed for moderate constipation. , Disp: , Rfl:    EPINEPHrine (EPIPEN 2-PAK) 0.3 mg/0.3 mL IJ SOAJ injection, use as directed for severe allergy reaction, Disp: 2 each, Rfl: 1   fenofibrate 160 MG tablet, TAKE 1 TABLET DAILY FOR CHOLESTEROL, Disp: 90 tablet, Rfl: 0   fluticasone (FLONASE) 50 MCG/ACT nasal spray, Use 2 sprays in each nostril daily,, Disp: 16 g, Rfl: 2   folic acid (FOLVITE) 400 MCG tablet, Take 400 mcg by mouth daily., Disp: , Rfl:    guaiFENesin (MUCINEX) 600 MG 12 hr tablet, Take 600 mg by mouth at bedtime as needed (congestion)., Disp: , Rfl:    Hypromellose (ARTIFICIAL TEARS OP), Place 1-2 drops into both eyes daily as needed (dry eyes)., Disp: , Rfl:    ketotifen (ZADITOR) 0.025 % ophthalmic solution, Place 1 drop into both eyes 2 (two) times daily as  needed (allergies)., Disp: , Rfl:    levocetirizine (XYZAL) 5 MG tablet, Take 1 tablet (5 mg total) by mouth every evening., Disp: 90 tablet, Rfl: 0   levothyroxine (SYNTHROID, LEVOTHROID) 50 MCG tablet, Take 50 mcg by mouth daily before breakfast. Brand Name only, Disp: , Rfl:    losartan (COZAAR) 25 MG tablet, Take 25 mg by mouth daily., Disp: , Rfl:    Magnesium 250 MG TABS, Take 1 tablet by mouth daily., Disp: , Rfl:    Melatonin 5 MG TABS, Take 1 tablet by mouth at bedtime. For sleep, Disp: , Rfl:    metoprolol tartrate (LOPRESSOR) 25 MG tablet, Take 12.5 mg by mouth daily., Disp: , Rfl:    Multiple Vitamins-Minerals (MULTIVITAMIN WITH MINERALS) tablet, Take 1 tablet by mouth daily., Disp: , Rfl:    OXYGEN, Inhale into the lungs. 2 litre at night, Disp: , Rfl:    pantoprazole (PROTONIX) 40 MG tablet, Take 1 tablet (40 mg total) by mouth daily., Disp: 90 tablet, Rfl: 1   pyridoxine (B-6) 100 MG tablet, Take 100 mg by mouth daily., Disp: , Rfl:    raloxifene (EVISTA) 60 MG tablet, Take 60 mg by mouth daily., Disp: , Rfl:    rosuvastatin (CRESTOR) 5 MG tablet, Take 1 tablet (5 mg total) by mouth daily., Disp: 90 tablet, Rfl: 3   sodium chloride (OCEAN) 0.65 % SOLN nasal spray, Place 1  spray into both nostrils 2 (two) times daily., Disp: , Rfl:    sucralfate (CARAFATE) 1 g tablet, Take 3 times daily with meals., Disp: 90 tablet, Rfl: 2   Vitamin A 7.5 MG (25000 UT) CAPS, Take 1 tablet by mouth 2 (two) times daily., Disp: , Rfl:    zinc gluconate 50 MG tablet, Take 50 mg by mouth daily., Disp: , Rfl:   Physical exam:  Vitals:   06/22/23 1411  BP: 138/82  Pulse: 97  Resp: 18  Temp: (!) 97 F (36.1 C)  TempSrc: Tympanic  SpO2: 95%  Weight: 149 lb 1.6 oz (67.6 kg)   Physical Exam Cardiovascular:     Rate and Rhythm: Normal rate and regular rhythm.     Heart sounds: Normal heart sounds.  Pulmonary:     Effort: Pulmonary effort is normal.     Breath sounds: Normal breath sounds.   Skin:    General: Skin is warm and dry.  Neurological:     Mental Status: She is alert and oriented to person, place, and time.         Latest Ref Rng & Units 10/06/2022    9:08 AM  CMP  Glucose 70 - 99 mg/dL 96   BUN 8 - 27 mg/dL 15   Creatinine 1.30 - 1.00 mg/dL 8.65   Sodium 784 - 696 mmol/L 136   Potassium 3.5 - 5.2 mmol/L 4.9   Chloride 96 - 106 mmol/L 98   CO2 20 - 29 mmol/L 23   Calcium 8.7 - 10.3 mg/dL 9.7   Total Protein 6.0 - 8.5 g/dL 6.6   Total Bilirubin 0.0 - 1.2 mg/dL 0.3   Alkaline Phos 44 - 121 IU/L 62   AST 0 - 40 IU/L 20   ALT 0 - 32 IU/L 20       Latest Ref Rng & Units 06/22/2023    1:53 PM  CBC  WBC 4.0 - 10.5 K/uL 6.8   Hemoglobin 12.0 - 15.0 g/dL 29.5   Hematocrit 28.4 - 46.0 % 38.0   Platelets 150 - 400 K/uL 256     Assessment and plan- Patient is a 77 y.o. female here for routine follow-up of iron deficiency anemia  Patient is not presently anemic with a hemoglobin of 12.4 which has improved by 1 point as compared to prior .  Iron studies are still pending.  It is unlikely that she will need IV iron at this time.  CBC ferritin and iron studies in 4 and 8 months and I will see her back in 8 months   Visit Diagnosis 1. Iron deficiency anemia, unspecified iron deficiency anemia type      Dr. Owens Shark, MD, MPH [facility] at [facility] [phone] 06/22/2023 3:04 PM

## 2023-06-29 DIAGNOSIS — R0602 Shortness of breath: Secondary | ICD-10-CM | POA: Diagnosis not present

## 2023-06-29 DIAGNOSIS — I10 Essential (primary) hypertension: Secondary | ICD-10-CM | POA: Diagnosis not present

## 2023-06-29 DIAGNOSIS — R600 Localized edema: Secondary | ICD-10-CM | POA: Diagnosis not present

## 2023-06-29 DIAGNOSIS — I25119 Atherosclerotic heart disease of native coronary artery with unspecified angina pectoris: Secondary | ICD-10-CM | POA: Diagnosis not present

## 2023-06-29 DIAGNOSIS — R531 Weakness: Secondary | ICD-10-CM | POA: Diagnosis not present

## 2023-06-29 DIAGNOSIS — I2510 Atherosclerotic heart disease of native coronary artery without angina pectoris: Secondary | ICD-10-CM | POA: Diagnosis not present

## 2023-06-29 DIAGNOSIS — R001 Bradycardia, unspecified: Secondary | ICD-10-CM | POA: Diagnosis not present

## 2023-06-29 DIAGNOSIS — I471 Supraventricular tachycardia, unspecified: Secondary | ICD-10-CM | POA: Diagnosis not present

## 2023-06-29 DIAGNOSIS — K219 Gastro-esophageal reflux disease without esophagitis: Secondary | ICD-10-CM | POA: Diagnosis not present

## 2023-06-29 DIAGNOSIS — R002 Palpitations: Secondary | ICD-10-CM | POA: Diagnosis not present

## 2023-07-12 ENCOUNTER — Ambulatory Visit (INDEPENDENT_AMBULATORY_CARE_PROVIDER_SITE_OTHER): Payer: Medicare Other | Admitting: Physician Assistant

## 2023-07-12 ENCOUNTER — Encounter: Payer: Self-pay | Admitting: Physician Assistant

## 2023-07-12 VITALS — BP 130/75 | HR 82 | Temp 98.1°F | Resp 16 | Ht 61.0 in | Wt 148.6 lb

## 2023-07-12 DIAGNOSIS — F331 Major depressive disorder, recurrent, moderate: Secondary | ICD-10-CM | POA: Diagnosis not present

## 2023-07-12 DIAGNOSIS — R7303 Prediabetes: Secondary | ICD-10-CM

## 2023-07-12 DIAGNOSIS — E039 Hypothyroidism, unspecified: Secondary | ICD-10-CM | POA: Diagnosis not present

## 2023-07-12 DIAGNOSIS — R5383 Other fatigue: Secondary | ICD-10-CM | POA: Diagnosis not present

## 2023-07-12 DIAGNOSIS — J301 Allergic rhinitis due to pollen: Secondary | ICD-10-CM | POA: Diagnosis not present

## 2023-07-12 DIAGNOSIS — E782 Mixed hyperlipidemia: Secondary | ICD-10-CM

## 2023-07-12 MED ORDER — FLUTICASONE PROPIONATE 50 MCG/ACT NA SUSP
NASAL | 2 refills | Status: AC
Start: 2023-07-12 — End: ?

## 2023-07-12 NOTE — Progress Notes (Signed)
[facility] [address] [place], [place] [address]  Internal MEDICINE  Office Visit Note  Patient Name: Vanessa Evans  [id number]  [id number]  Date of Service: 07/12/2023  Chief Complaint  Patient presents with   Follow-up   Depression   Gastroesophageal Reflux   Hypertension   Hyperlipidemia    HPI Pt is here for routine follow up -Has done one iron infusion and has follow up with hematology in a few months -was having leg swelling in Oct with SOB and congestion and went to cardiology who was worried about CHF--she was given torsemide, and metoprolol and losartan now full tab instead of 1/2. Not taking torsemide now since swelling resolved, but has it PRN along with potassium PRN, but has not needed it again -cardiology also suggested she hold rosuvastatin due to leg cramps for a few months, still has them, but not quite as much. -Goes back to cardiology in March -Has been getting back injections for awhile and not lasting. Still having daily pain. May consider second opinion after new year if not better. Current provider is avoiding surgery currently, which she wants to avoid as well if not recommended, but may consider spinal cord stimulator -still has her sad moments due to loss of of her son combined with chronic pain and inability to do things she once did. She states it is not all the time and has improved, but can come in spurts and she speaks with family as needed. Declines counseling still at this time. -Will have labs prior to CPE, only sees endo once per year therefore requests to check A1c and thyroid as well  Current Medication: Outpatient Encounter Medications as of 07/12/2023  Medication Sig   acetaminophen (TYLENOL) 500 MG tablet Take 1,000 mg by mouth 2 (two) times daily as needed for moderate pain.   albuterol (VENTOLIN HFA) 108 (90 Base) MCG/ACT inhaler Inhale 2 puffs into the lungs every 6 (six) hours as needed for wheezing or shortness of breath.    aspirin EC 81 MG tablet Take 81 mg by mouth at bedtime.    BLACK ELDERBERRY PO Take 1 Dose by mouth 2 (two) times a day.   Calcium Carbonate-Vitamin D 600-400 MG-UNIT tablet Take 1 tablet by mouth 2 (two) times daily.   cholecalciferol (VITAMIN D) 1000 UNITS tablet Take 1,000 Units by mouth at bedtime.    citalopram (CELEXA) 20 MG tablet Take 1 tablet (20 mg total) by mouth daily.   cyanocobalamin 500 MCG tablet Take 500 mcg by mouth daily.   Dextran 70-Hypromellose (ARTIFICIAL TEARS) 0.1-0.3 % SOLN Apply 1-2 drops to eye daily.   diazepam (VALIUM) 5 MG tablet Take 1-2 tablets (5-10 mg total) by mouth once as needed for up to 1 dose (anxiety due to medical procedure).   diclofenac Sodium (VOLTAREN) 1 % GEL Apply 4 g topically 4 (four) times daily.   diltiazem (CARDIZEM CD) 120 MG 24 hr capsule Take 180 mg by mouth at bedtime.   diphenhydrAMINE (BENADRYL) 25 MG tablet Take 25-50 mg by mouth daily as needed for itching.   docusate sodium (COLACE) 100 MG capsule Take 200 mg by mouth at bedtime as needed for moderate constipation.    EPINEPHrine (EPIPEN 2-PAK) 0.3 mg/0.3 mL IJ SOAJ injection use as directed for severe allergy reaction   fenofibrate 160 MG tablet TAKE 1 TABLET DAILY FOR CHOLESTEROL   folic acid (FOLVITE) 400 MCG tablet Take 400 mcg by mouth daily.   guaiFENesin (MUCINEX) 600 MG 12  hr tablet Take 600 mg by mouth at bedtime as needed (congestion).   Hypromellose (ARTIFICIAL TEARS OP) Place 1-2 drops into both eyes daily as needed (dry eyes).   ketotifen (ZADITOR) 0.025 % ophthalmic solution Place 1 drop into both eyes 2 (two) times daily as needed (allergies).   levocetirizine (XYZAL) 5 MG tablet Take 1 tablet (5 mg total) by mouth every evening.   levothyroxine (SYNTHROID, LEVOTHROID) 50 MCG tablet Take 50 mcg by mouth daily before breakfast. Brand Name only   losartan (COZAAR) 25 MG tablet Take 25 mg by mouth daily.   Magnesium 250 MG TABS Take 1 tablet by mouth daily.    Melatonin 5 MG TABS Take 1 tablet by mouth at bedtime. For sleep   metoprolol tartrate (LOPRESSOR) 25 MG tablet Take 12.5 mg by mouth daily.   Multiple Vitamins-Minerals (MULTIVITAMIN WITH MINERALS) tablet Take 1 tablet by mouth daily.   OXYGEN Inhale into the lungs. 2 litre at night   pantoprazole (PROTONIX) 40 MG tablet Take 1 tablet (40 mg total) by mouth daily.   pyridoxine (B-6) 100 MG tablet Take 100 mg by mouth daily.   raloxifene (EVISTA) 60 MG tablet Take 60 mg by mouth daily.   rosuvastatin (CRESTOR) 5 MG tablet Take 1 tablet (5 mg total) by mouth daily.   sodium chloride (OCEAN) 0.65 % SOLN nasal spray Place 1 spray into both nostrils 2 (two) times daily.   sucralfate (CARAFATE) 1 g tablet Take 3 times daily with meals.   Vitamin A 7.5 MG (25000 UT) CAPS Take 1 tablet by mouth 2 (two) times daily.   zinc gluconate 50 MG tablet Take 50 mg by mouth daily.   [DISCONTINUED] fluticasone (FLONASE) 50 MCG/ACT nasal spray Use 2 sprays in each nostril daily,   fluticasone (FLONASE) 50 MCG/ACT nasal spray Use 2 sprays in each nostril daily,   No facility-administered encounter medications on file as of 07/12/2023.    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 cope.  all benign.  inflammatory process going on in bladder   BREAST BIOPSY Right    benign   BREAST SURGERY Right    lumpectomy   CATARACT EXTRACTION W/PHACO Left 12/25/2014   Procedure: CATARACT EXTRACTION PHACO AND INTRAOCULAR LENS PLACEMENT (IOC);  Surgeon: Galen Manila, MD;  Location: [facility] ORS;  Service: Ophthalmology;  Laterality: Left;  [place] 00:32 AP% 20.0 CDE 6.49   COLONOSCOPY W/ BIOPSIES     COLONOSCOPY W/ POLYPECTOMY  2002, 2004, 2005   adenomatous polyps removed   COLONOSCOPY WITH PROPOFOL N/A 08/23/2017   Procedure:  COLONOSCOPY WITH PROPOFOL;  Surgeon: Scot Jun, MD;  Location[facility] ENDOSCOPY;  Service: Endoscopy;  Laterality: N/A;   COLONOSCOPY WITH PROPOFOL N/A 11/08/2017   Procedure: COLONOSCOPY WITH PROPOFOL;  Surgeon: Scot Jun, MD;  Location: [facility] ENDOSCOPY;  Service: Endoscopy;  Laterality: N/A;   COLONOSCOPY WITH PROPOFOL N/A 06/30/2021   Procedure: COLONOSCOPY WITH PROPOFOL;  Surgeon: Regis Bill, MD;  Location: [facility] ENDOSCOPY;  Service: Endoscopy;  Laterality: N/A;   CYSTOCELE REPAIR N/A 04/09/2016   Procedure: ANTERIOR REPAIR (CYSTOCELE);  Surgeon: Nadara Mustard, MD;  Location: [facility] ORS;  Service: Gynecology;  Laterality: N/A;   DIAGNOSTIC LAPAROSCOPY  2008   removed both ovaries  with cysts, tubes and fibroids   DILATION AND CURETTAGE OF UTERUS  1973   ESOPHAGOGASTRODUODENOSCOPY (EGD) WITH PROPOFOL N/A 08/23/2017   Procedure: ESOPHAGOGASTRODUODENOSCOPY (EGD) WITH PROPOFOL;  Surgeon: Scot Jun, MD;  Location: [facility] ENDOSCOPY;  Service: Endoscopy;  Laterality: N/A;   ESOPHAGOGASTRODUODENOSCOPY (EGD) WITH PROPOFOL N/A 06/30/2021   Procedure: ESOPHAGOGASTRODUODENOSCOPY (EGD) WITH PROPOFOL;  Surgeon: Regis Bill, MD;  Location: [facility] ENDOSCOPY;  Service: Endoscopy;  Laterality: N/A;   EYE SURGERY Bilateral 2016   HERNIA REPAIR Right 2008   Inguinal hernia Repair, ventral hernia repair   IR KYPHO THORACIC WITH BONE BIOPSY  05/12/2022   IR RADIOLOGIST EVAL & MGMT  05/19/2022   IR RADIOLOGIST EVAL & MGMT  05/26/2022   JOINT REPLACEMENT Right 2008   knee   KNEE ARTHROSCOPY Right    LUMBAR LAMINECTOMY/DECOMPRESSION MICRODISCECTOMY Right 03/21/2013   Procedure: Right Lumbar five-Sacral one Laminectomy for Synovial Cyst;  Surgeon: Reinaldo Meeker, MD;  Location: [facility] NEURO ORS;  Service: Neurosurgery;  Laterality: Right;  right   OOPHORECTOMY     REVERSE SHOULDER ARTHROPLASTY Right 02/22/2018   Procedure: REVERSE SHOULDER ARTHROPLASTY;  Surgeon: Christena Flake, MD;   Location: [facility] ORS;  Service: Orthopedics;  Laterality: Right;   TUBAL LIGATION     UNILATERAL SALPINGECTOMY Left 04/09/2016   Procedure: UNILATERAL SALPINGECTOMY;  Surgeon: Nadara Mustard, MD;  Location: [facility] ORS;  Service: Gynecology;  Laterality: Left;   UPPER GI ENDOSCOPY  2010   with biopsy of gastric erosion   VAGINAL HYSTERECTOMY N/A 04/09/2016   Procedure: HYSTERECTOMY VAGINAL;  Surgeon: Nadara Mustard, MD;  Location: [facility] ORS;  Service: Gynecology;  Laterality: N/A;   VAGINAL HYSTERECTOMY  2017   and bladder tack    Medical History: Past Medical History:  Diagnosis Date   Arthritis    osteo   Asthma    needs rescue inhaler few times a year   Claustrophobia 04/21/2020   Depression    Dizziness    light headed spells but it has been a while   Dysrhythmia    tachycardia.(cardizem). brady during procedures   Fibromyalgia    GERD (gastroesophageal reflux disease)    gastritis   Headache(784.0)    Heart murmur 2019   aortic. dr. Gwen Pounds not worried about this   Hyperlipidemia    Hypertension    Hypothyroidism    Macular degeneration    Migraine    Orthopnea    Pneumonia    long time ago (greater than 5 years ago)   PONV (postoperative nausea and vomiting)    very anxious during cataract surgery. brady during colonoscopy   Shortness of breath    any exertion, cannot lie flat    Family History: Family History  Problem Relation Age of Onset   Pulmonary fibrosis Mother    Melanoma Father    COPD Sister    Cardiomyopathy Sister        and arrhythmia   Atrial fibrillation Sister    COPD Brother    Pulmonary fibrosis Brother    Cancer Brother    Breast cancer Cousin        paternal 1st    Social History   Socioeconomic History   Marital status: Married    Spouse name: Not on file   Number of children: Not on file   Years of education: Not on file   Highest education level: Not on file  Occupational History   Not on file  Tobacco Use   Smoking  status: Former    Current packs/day: 0.00    Types: Cigarettes    Quit date: 78    Years since quitting: 57.9   Smokeless tobacco: Never  Vaping Use   Vaping status: Never Used  Substance and Sexual Activity   Alcohol use: Yes    Comment: occasionally   Drug use: No   Sexual activity: Not Currently  Other Topics Concern   Not on file  Social History Narrative   Not on file   Social Determinants of Health   Financial Resource Strain: Not on file  Food Insecurity: No Food Insecurity (04/21/2023)   Hunger Vital Sign    Worried About Running Out of Food in the Last Year: Never true    Ran Out of Food in the Last Year: Never true  Transportation Needs: No Transportation Needs (04/21/2023)   PRAPARE - Administrator, Civil Service (Medical): No    Lack of Transportation (Non-Medical): No  Physical Activity: Not on file  Stress: Not on file  Social Connections: Not on file  Intimate Partner Violence: Not At Risk (04/21/2023)   Humiliation, Afraid, Rape, and Kick questionnaire    Fear of Current or Ex-Partner: No    Emotionally Abused: No    Physically Abused: No    Sexually Abused: No      Review of Systems  Constitutional:  Positive for fatigue. Negative for chills and unexpected weight change.  HENT:  Negative for congestion, rhinorrhea, sneezing and sore throat.   Eyes:  Negative for redness.  Respiratory:  Negative for cough, chest tightness and shortness of breath.   Cardiovascular:  Negative for chest pain and palpitations.  Gastrointestinal:  Negative for abdominal pain, constipation, diarrhea, nausea and vomiting.  Genitourinary:  Negative for dysuria and frequency.  Musculoskeletal:  Positive for arthralgias and back pain. Negative for joint swelling and neck pain.  Skin:  Negative for rash.  Neurological: Negative.  Negative for tremors and numbness.  Hematological:  Negative for adenopathy. Does not bruise/bleed easily.  Psychiatric/Behavioral:   Positive for dysphoric mood. Negative for self-injury, sleep disturbance and suicidal ideas. Behavioral problem: Depression.The patient is nervous/anxious.     Vital Signs: BP 130/75   Pulse 82   Temp 98.1 F (36.7 C)   Resp 16   Ht 5\' 1"  (1.549 m)   Wt 148 lb 9.6 oz (67.4 kg)   SpO2 96%   BMI 28.08 kg/m    Physical Exam Constitutional:      General: She is not in acute distress.    Appearance: She is well-developed. She is not diaphoretic.  HENT:     Head: Normocephalic and atraumatic.     Mouth/Throat:     Pharynx: No oropharyngeal exudate.  Eyes:     Pupils: Pupils are equal, round, and reactive to light.  Neck:     Thyroid: No thyromegaly.     Vascular: No JVD.     Trachea: No tracheal deviation.  Cardiovascular:     Rate and Rhythm: Normal rate and regular rhythm.     Heart sounds: Normal heart sounds. No murmur heard.    No friction rub. No gallop.  Pulmonary:     Effort: Pulmonary effort is normal. No respiratory distress.     Breath sounds: No wheezing or rales.  Chest:     Chest wall: No tenderness.  Abdominal:     General: Bowel sounds are normal.     Palpations: Abdomen is soft.  Musculoskeletal:  General: Normal range of motion.     Cervical back: Normal range of motion and neck supple.  Lymphadenopathy:     Cervical: No cervical adenopathy.  Skin:    General: Skin is warm and dry.  Neurological:     Mental Status: She is alert and oriented to person, place, and time.     Cranial Nerves: No cranial nerve deficit.  Psychiatric:        Behavior: Behavior normal.        Thought Content: Thought content normal.        Judgment: Judgment normal.        Assessment/Plan: 1. Moderate episode of recurrent major depressive disorder (HCC) Continue current medication  2. Allergic rhinitis due to pollen, unspecified seasonality - fluticasone (FLONASE) 50 MCG/ACT nasal spray; Use 2 sprays in each nostril daily,  Dispense: 16 g; Refill: 2  3.  Mixed hyperlipidemia - Lipid Panel With LDL/HDL Ratio  4. Hypothyroidism, unspecified type - TSH + free T4  5. Prediabetes - Hgb A1C w/o eAG  6. Other fatigue - CBC w/Diff/Platelet - Comprehensive metabolic panel - TSH + free T4 - Hgb A1C w/o eAG - Lipid Panel With LDL/HDL Ratio   General Counseling: Lisaanne verbalizes understanding of the findings of todays visit and agrees with plan of treatment. I have discussed any further diagnostic evaluation that may be needed or ordered today. We also reviewed her medications today. she has been encouraged to call the office with any questions or concerns that should arise related to todays visit.    Orders Placed This Encounter  Procedures   CBC w/Diff/Platelet   Comprehensive metabolic panel   TSH + free T4   Hgb A1C w/o eAG   Lipid Panel With LDL/HDL Ratio    Meds ordered this encounter  Medications   fluticasone (FLONASE) 50 MCG/ACT nasal spray    Sig: Use 2 sprays in each nostril daily,    Dispense:  16 g    Refill:  2    This patient was seen by Lynn Ito, PA-C in collaboration with Dr. Beverely Risen as a part of collaborative care agreement.   Total time spent:30 Minutes Time spent includes review of chart, medications, test results, and follow up plan with the patient.      Dr Lyndon Code Internal medicine

## 2023-07-13 DIAGNOSIS — H353211 Exudative age-related macular degeneration, right eye, with active choroidal neovascularization: Secondary | ICD-10-CM | POA: Diagnosis not present

## 2023-07-19 ENCOUNTER — Other Ambulatory Visit: Payer: Self-pay | Admitting: Physician Assistant

## 2023-07-19 DIAGNOSIS — J301 Allergic rhinitis due to pollen: Secondary | ICD-10-CM

## 2023-08-10 ENCOUNTER — Other Ambulatory Visit: Payer: Self-pay | Admitting: Physician Assistant

## 2023-08-10 DIAGNOSIS — E782 Mixed hyperlipidemia: Secondary | ICD-10-CM

## 2023-08-26 LAB — COMPREHENSIVE METABOLIC PANEL
ALT: 24 [IU]/L (ref 0–32)
AST: 21 [IU]/L (ref 0–40)
Albumin: 4.4 g/dL (ref 3.8–4.8)
Alkaline Phosphatase: 55 [IU]/L (ref 44–121)
BUN/Creatinine Ratio: 24 (ref 12–28)
BUN: 18 mg/dL (ref 8–27)
Bilirubin Total: 0.2 mg/dL (ref 0.0–1.2)
CO2: 26 mmol/L (ref 20–29)
Calcium: 9.8 mg/dL (ref 8.7–10.3)
Chloride: 100 mmol/L (ref 96–106)
Creatinine, Ser: 0.76 mg/dL (ref 0.57–1.00)
Globulin, Total: 2.2 g/dL (ref 1.5–4.5)
Glucose: 105 mg/dL — ABNORMAL HIGH (ref 70–99)
Potassium: 5.2 mmol/L (ref 3.5–5.2)
Sodium: 138 mmol/L (ref 134–144)
Total Protein: 6.6 g/dL (ref 6.0–8.5)
eGFR: 80 mL/min/{1.73_m2} (ref >59)

## 2023-08-26 LAB — CBC WITH DIFFERENTIAL/PLATELET
Basophils Absolute: 0.1 10*3/uL (ref 0.0–0.2)
Basos: 1 %
EOS (ABSOLUTE): 0.2 10*3/uL (ref 0.0–0.4)
Eos: 4 %
Hematocrit: 39.4 % (ref 34.0–46.6)
Hemoglobin: 12.7 g/dL (ref 11.1–15.9)
Immature Grans (Abs): 0.1 10*3/uL (ref 0.0–0.1)
Immature Granulocytes: 1 %
Lymphocytes Absolute: 1.5 10*3/uL (ref 0.7–3.1)
Lymphs: 26 %
MCH: 30 pg (ref 26.6–33.0)
MCHC: 32.2 g/dL (ref 31.5–35.7)
MCV: 93 fL (ref 79–97)
Monocytes Absolute: 0.7 10*3/uL (ref 0.1–0.9)
Monocytes: 13 %
Neutrophils Absolute: 3.1 10*3/uL (ref 1.4–7.0)
Neutrophils: 55 %
Platelets: 317 10*3/uL (ref 150–450)
RBC: 4.24 x10E6/uL (ref 3.77–5.28)
RDW: 12.1 % (ref 11.7–15.4)
WBC: 5.7 10*3/uL (ref 3.4–10.8)

## 2023-08-26 LAB — LIPID PANEL WITH LDL/HDL RATIO
Cholesterol, Total: 138 mg/dL (ref 100–199)
HDL: 51 mg/dL (ref >39)
LDL Chol Calc (NIH): 66 mg/dL (ref 0–99)
LDL/HDL Ratio: 1.3 ratio (ref 0.0–3.2)
Triglycerides: 116 mg/dL (ref 0–149)
VLDL Cholesterol Cal: 21 mg/dL (ref 5–40)

## 2023-08-26 LAB — HGB A1C W/O EAG: Hgb A1c MFr Bld: 6.3 % — ABNORMAL HIGH (ref 4.8–5.6)

## 2023-08-26 LAB — TSH+FREE T4
Free T4: 1.18 ng/dL (ref 0.82–1.77)
TSH: 1.35 u[IU]/mL (ref 0.450–4.500)

## 2023-08-31 ENCOUNTER — Ambulatory Visit (INDEPENDENT_AMBULATORY_CARE_PROVIDER_SITE_OTHER): Payer: Medicare Other | Admitting: Podiatry

## 2023-08-31 ENCOUNTER — Encounter: Payer: Self-pay | Admitting: Podiatry

## 2023-08-31 VITALS — Ht 61.0 in | Wt 148.6 lb

## 2023-08-31 DIAGNOSIS — M7671 Peroneal tendinitis, right leg: Secondary | ICD-10-CM | POA: Diagnosis not present

## 2023-08-31 DIAGNOSIS — Q666 Other congenital valgus deformities of feet: Secondary | ICD-10-CM

## 2023-08-31 NOTE — Progress Notes (Signed)
Subjective:  Patient ID: Vanessa Evans, female    DOB: 11-Dec-1945,  MRN: [id number]  Chief Complaint  Patient presents with   Foot Pain    Pt is here due to right foot pain.    78 y.o. female presents with the above complaint.  Patient presents for follow-up of right peroneal tendinitis which she states started acting up the injection helped considerably she would like to do another 1 she also has underlying flatfoot deformity for which she would like to discuss shoe gear modification  Review of Systems: Negative except as noted in the HPI. Denies N/V/F/Ch.  Past Medical History:  Diagnosis Date   Arthritis    osteo   Asthma    needs rescue inhaler few times a year   Claustrophobia 04/21/2020   Depression    Dizziness    light headed spells but it has been a while   Dysrhythmia    tachycardia.(cardizem). brady during procedures   Fibromyalgia    GERD (gastroesophageal reflux disease)    gastritis   Headache(784.0)    Heart murmur 2019   aortic. dr. Gwen Pounds not worried about this   Hyperlipidemia    Hypertension    Hypothyroidism    Macular degeneration    Migraine    Orthopnea    Pneumonia    long time ago (greater than 5 years ago)   PONV (postoperative nausea and vomiting)    very anxious during cataract surgery. brady during colonoscopy   Shortness of breath    any exertion, cannot lie flat    Current Outpatient Medications:    acetaminophen (TYLENOL) 500 MG tablet, Take 1,000 mg by mouth 2 (two) times daily as needed for moderate pain., Disp: , Rfl:    albuterol (VENTOLIN HFA) 108 (90 Base) MCG/ACT inhaler, Inhale 2 puffs into the lungs every 6 (six) hours as needed for wheezing or shortness of breath., Disp: 8 g, Rfl: 3   aspirin EC 81 MG tablet, Take 81 mg by mouth at bedtime. , Disp: , Rfl:    BLACK ELDERBERRY PO, Take 1 Dose by mouth 2 (two) times a day., Disp: , Rfl:    Calcium Carbonate-Vitamin D 600-400 MG-UNIT tablet, Take 1 tablet by mouth 2 (two)  times daily., Disp: , Rfl:    cholecalciferol (VITAMIN D) 1000 UNITS tablet, Take 1,000 Units by mouth at bedtime. , Disp: , Rfl:    citalopram (CELEXA) 20 MG tablet, Take 1 tablet (20 mg total) by mouth daily., Disp: 90 tablet, Rfl: 0   cyanocobalamin 500 MCG tablet, Take 500 mcg by mouth daily., Disp: , Rfl:    Dextran 70-Hypromellose (ARTIFICIAL TEARS) 0.1-0.3 % SOLN, Apply 1-2 drops to eye daily., Disp: , Rfl:    diazepam (VALIUM) 5 MG tablet, Take 1-2 tablets (5-10 mg total) by mouth once as needed for up to 1 dose (anxiety due to medical procedure)., Disp: 2 tablet, Rfl: 0   diclofenac Sodium (VOLTAREN) 1 % GEL, Apply 4 g topically 4 (four) times daily., Disp: 500 g, Rfl: 3   diltiazem (CARDIZEM CD) 120 MG 24 hr capsule, Take 180 mg by mouth at bedtime., Disp: , Rfl: 3   diphenhydrAMINE (BENADRYL) 25 MG tablet, Take 25-50 mg by mouth daily as needed for itching., Disp: , Rfl:    docusate sodium (COLACE) 100 MG capsule, Take 200 mg by mouth at bedtime as needed for moderate constipation. , Disp: , Rfl:    EPINEPHrine (EPIPEN 2-PAK) 0.3 mg/0.3 mL IJ SOAJ injection, use  as directed for severe allergy reaction, Disp: 2 each, Rfl: 1   fenofibrate 160 MG tablet, TAKE 1 TABLET DAILY FOR CHOLESTEROL, Disp: 90 tablet, Rfl: 0   fluticasone (FLONASE) 50 MCG/ACT nasal spray, Use 2 sprays in each nostril daily,, Disp: 16 g, Rfl: 2   folic acid (FOLVITE) 400 MCG tablet, Take 400 mcg by mouth daily., Disp: , Rfl:    guaiFENesin (MUCINEX) 600 MG 12 hr tablet, Take 600 mg by mouth at bedtime as needed (congestion)., Disp: , Rfl:    Hypromellose (ARTIFICIAL TEARS OP), Place 1-2 drops into both eyes daily as needed (dry eyes)., Disp: , Rfl:    ketotifen (ZADITOR) 0.025 % ophthalmic solution, Place 1 drop into both eyes 2 (two) times daily as needed (allergies)., Disp: , Rfl:    levocetirizine (XYZAL) 5 MG tablet, Take 1 tablet (5 mg total) by mouth every evening., Disp: 90 tablet, Rfl: 0   levothyroxine  (SYNTHROID, LEVOTHROID) 50 MCG tablet, Take 50 mcg by mouth daily before breakfast. Brand Name only, Disp: , Rfl:    losartan (COZAAR) 25 MG tablet, Take 25 mg by mouth daily., Disp: , Rfl:    Magnesium 250 MG TABS, Take 1 tablet by mouth daily., Disp: , Rfl:    Melatonin 5 MG TABS, Take 1 tablet by mouth at bedtime. For sleep, Disp: , Rfl:    metoprolol tartrate (LOPRESSOR) 25 MG tablet, Take 12.5 mg by mouth daily., Disp: , Rfl:    Multiple Vitamins-Minerals (MULTIVITAMIN WITH MINERALS) tablet, Take 1 tablet by mouth daily., Disp: , Rfl:    OXYGEN, Inhale into the lungs. 2 litre at night, Disp: , Rfl:    pantoprazole (PROTONIX) 40 MG tablet, Take 1 tablet (40 mg total) by mouth daily., Disp: 90 tablet, Rfl: 1   pyridoxine (B-6) 100 MG tablet, Take 100 mg by mouth daily., Disp: , Rfl:    raloxifene (EVISTA) 60 MG tablet, Take 60 mg by mouth daily., Disp: , Rfl:    rosuvastatin (CRESTOR) 5 MG tablet, Take 1 tablet (5 mg total) by mouth daily., Disp: 90 tablet, Rfl: 3   sodium chloride (OCEAN) 0.65 % SOLN nasal spray, Place 1 spray into both nostrils 2 (two) times daily., Disp: , Rfl:    sucralfate (CARAFATE) 1 g tablet, Take 3 times daily with meals., Disp: 90 tablet, Rfl: 2   Vitamin A 7.5 MG (25000 UT) CAPS, Take 1 tablet by mouth 2 (two) times daily., Disp: , Rfl:    zinc gluconate 50 MG tablet, Take 50 mg by mouth daily., Disp: , Rfl:   Social History   Tobacco Use  Smoking Status Former   Current packs/day: 0.00   Types: Cigarettes   Quit date: 1967   Years since quitting: 58.0  Smokeless Tobacco Never    Allergies  Allergen Reactions   Codeine Shortness Of Breath and Itching    Bronchospasms and asthma attach   Levofloxacin     Altered mental status": drunk" feeling, confused, speech problems Other reaction(s): Unknown    Oxycodone Itching and Shortness Of Breath    Would take benadryl to eliminate itching. Has had bronchospasm   Ultram [Tramadol] Itching and Other (See  Comments)    Bronchospasm and asthma attack   Nitrofurantoin Diarrhea    Tried again and had no problems with it Severe and long lasting   Nsaids Other (See Comments)    Bloody stools, abdominal pain, ulcer History of gastritis   Sulfacetamide Rash    Fever, abdominal pain  Adhesive [Tape] Other (See Comments)    Thin skin. Causes tears. Paper tape okay   Azithromycin     Unsure. Patient does not remember but thinks that it did not work for her.   Ciprofloxacin Other (See Comments)    Severe pain in neck and down back    Ketorolac Itching   Sulfa Antibiotics Itching, Rash and Other (See Comments)    fever   Tolmetin     Other reaction(s): Other (See Comments) Bloody stools, abdominal pain History of chronic gastritis   Zofran [Ondansetron Hcl] Other (See Comments)    constipation   Objective:   There were no vitals filed for this visit.  Body mass index is 28.08 kg/m. Constitutional Well developed. Well nourished.  Vascular Dorsalis pedis pulses palpable bilaterally. Posterior tibial pulses palpable bilaterally. Capillary refill normal to all digits.  No cyanosis or clubbing noted. Pedal hair growth normal.  Neurologic Normal speech. Oriented to person, place, and time. Epicritic sensation to light touch grossly present bilaterally.  Dermatologic Nails well groomed and normal in appearance. No open wounds. No skin lesions.  Orthopedic: No further pain on palpation along the course of the posterior tibial tendon no pain at the insertion.  No further pain with resisted plantarflexion inversion of the foot no pain with dorsiflexion eversion of the foot.  Pain on palpation to the peroneal tendon pain with dorsiflexion eversion of the foot resisted.  No pain with plantarflexion inversion of the foot   Radiographs: None Assessment:   1. Peroneal tendinitis, right   2. Pes planovalgus     Plan:  Patient was evaluated and treated and all questions answered.  Right  posterior tibial tendinitis -All questions and concerns were discussed with the patient in extensive detail -clinically resolved with cam boot immobilization and steroid injection.  Right peroneal tendinitis with underlying pes planovalgus -Given that she is still having some residual pain along peroneal tendon as she would benefit from steroid injection help decrease acute inflammatory component associate with pain.  Patient agrees with plan to proceed with steroid injection to the peroneal tendon. -A steroid injection was performed at right lateral foot a point of maximal tenderness using 1% plain Lidocaine and 10 mg of Kenalog. This was well tolerated. -Shoe gear modification discussed   No follow-ups on file.

## 2023-09-03 ENCOUNTER — Ambulatory Visit: Payer: Medicare Other | Admitting: Physician Assistant

## 2023-09-06 ENCOUNTER — Telehealth: Payer: Self-pay

## 2023-09-06 ENCOUNTER — Other Ambulatory Visit: Payer: Self-pay

## 2023-09-06 MED ORDER — IPRATROPIUM-ALBUTEROL 0.5-2.5 (3) MG/3ML IN SOLN: 3.0000 mL | Freq: Four times a day (QID) | RESPIRATORY_TRACT | 0 refills | Status: AC | PRN

## 2023-09-06 MED ORDER — OSELTAMIVIR PHOSPHATE 75 MG PO CAPS: 75.0000 mg | ORAL_CAPSULE | Freq: Two times a day (BID) | ORAL | 0 refills | Status: DC

## 2023-09-06 NOTE — Telephone Encounter (Signed)
Pt called that she exposure to flu and she had flu like symptoms ,congestion and unable to go to urgent care as per lauren send Tamiflu  and Duoneb and advised  her she getting worse go to urgent care and take OTC Mucinex

## 2023-09-08 ENCOUNTER — Ambulatory Visit
Admission: RE | Admit: 2023-09-08 | Discharge: 2023-09-08 | Disposition: A | Discharge status: Home / Self Care | Payer: Medicare Other | Source: Ambulatory Visit | Attending: Emergency Medicine | Admitting: Emergency Medicine

## 2023-09-08 ENCOUNTER — Telehealth: Payer: Medicare Other | Admitting: Nurse Practitioner

## 2023-09-08 ENCOUNTER — Ambulatory Visit (INDEPENDENT_AMBULATORY_CARE_PROVIDER_SITE_OTHER): Payer: Medicare Other

## 2023-09-08 ENCOUNTER — Encounter: Payer: Self-pay | Admitting: Oncology

## 2023-09-08 VITALS — BP 146/81 | HR 101 | Temp 98.7°F | Resp 18

## 2023-09-08 DIAGNOSIS — R051 Acute cough: Secondary | ICD-10-CM

## 2023-09-08 DIAGNOSIS — J111 Influenza due to unidentified influenza virus with other respiratory manifestations: Secondary | ICD-10-CM

## 2023-09-08 MED ORDER — IPRATROPIUM BROMIDE 0.06 % NA SOLN: 2.0000 | Freq: Four times a day (QID) | NASAL | 12 refills | Status: DC

## 2023-09-08 MED ORDER — PROMETHAZINE-DM 6.25-15 MG/5ML PO SYRP: 5.0000 mL | ORAL_SOLUTION | Freq: Four times a day (QID) | ORAL | 0 refills | Status: DC | PRN

## 2023-09-08 MED ORDER — BENZONATATE 100 MG PO CAPS: 200.0000 mg | ORAL_CAPSULE | Freq: Three times a day (TID) | ORAL | 0 refills | Status: DC

## 2023-09-08 NOTE — ED Triage Notes (Signed)
Pt c/o cough, chills, fever, bodyaches, vomiting and SOB x 3 days. On 09/06/23 her PCP sent her in Tamiflu and Duo-neb. Pt states she is getting worse.

## 2023-09-08 NOTE — ED Provider Notes (Addendum)
[facility]    CSN: [id number] Arrival date & time: 09/08/23  1456      History   Chief Complaint Chief Complaint  Patient presents with   Chills   Fever   Cough   Emesis   Shortness of Breath    HPI Vanessa Evans is a 78 y.o. female.   HPI  78 year old female with past medical history significant for heart murmur, GERD, hypertension, hyperlipidemia, pneumonia, migraine headaches, hypothyroidism, and asthma presents for evaluation of worsening respiratory symptoms over the last 3 days.  The patient reports that her cough is getting worse and she starting to produce green and yellow sputum.  She was started empirically on Tamiflu by her PCP due to the fact that her grandson exhibited the classic signs of flu.  However, she was not tested for influenza.  She did take 2 home COVID test that were both negative.  Past Medical History:  Diagnosis Date   Arthritis    osteo   Asthma    needs rescue inhaler few times a year   Claustrophobia 04/21/2020   Depression    Dizziness    light headed spells but it has been a while   Dysrhythmia    tachycardia.(cardizem). brady during procedures   Fibromyalgia    GERD (gastroesophageal reflux disease)    gastritis   Headache(784.0)    Heart murmur 2019   aortic. dr. Gwen Pounds not worried about this   Hyperlipidemia    Hypertension    Hypothyroidism    Macular degeneration    Migraine    Orthopnea    Pneumonia    long time ago (greater than 5 years ago)   PONV (postoperative nausea and vomiting)    very anxious during cataract surgery. brady during colonoscopy   Shortness of breath    any exertion, cannot lie flat    Patient Active Problem List   Diagnosis Date Noted   Lymphedema 01/18/2022   Coronary artery disease involving native coronary artery of native heart with angina pectoris (HCC) 10/28/2020   Peptic ulcer disease 04/21/2020   Claustrophobia 04/21/2020   Urinary tract infection without hematuria  02/04/2020   Pain in both lower extremities 12/18/2019   Inflammatory polyarthritis (HCC) 07/26/2019   Excessive daytime sleepiness 07/26/2019   Encounter for screening mammogram for malignant neoplasm of breast 07/26/2019   Intercostal muscle pain 06/25/2019   Positive ANA (antinuclear antibody) 05/01/2019   Primary osteoarthritis involving multiple joints 04/26/2019   Mild intermittent asthma without complication 08/22/2018   Seasonal allergic rhinitis due to pollen 08/22/2018   Chills with fever 08/11/2018   Sore throat 08/11/2018   Acute upper respiratory infection 08/11/2018   Candidiasis 08/11/2018   Iron deficiency anemia 05/05/2018   Right hip pain 04/27/2018   Accidental fall from furniture 04/27/2018   Vitamin D deficiency 04/27/2018   Cough 04/18/2018   Dysuria 04/18/2018   Status post reverse total shoulder replacement, right 02/22/2018   Abnormal ECG 02/18/2018   Pain in joint of right shoulder 02/13/2018   Abdominal aortic atherosclerosis (HCC) 02/13/2018   Episode of recurrent major depressive disorder (HCC) 02/13/2018   Encounter for general adult medical examination with abnormal findings 02/13/2018   Lipoma of right shoulder 01/31/2018   Rotator cuff tendinitis, right 01/31/2018   Chronic superficial gastritis without bleeding 12/12/2017   Schatzki's ring 12/12/2017   Acute gastritis 11/08/2017   Nausea 10/18/2017   Other fatigue 10/18/2017   Allergic rhinitis 09/20/2017   Eczema 09/20/2017  Hematuria 09/20/2017   Mixed hyperlipidemia 09/20/2017   Osteoarthritis 09/20/2017   Osteoporosis, post-menopausal 09/20/2017   Palpitations 07/05/2017   Epigastric pain 05/24/2017   Gastroesophageal reflux disease without esophagitis 05/24/2017   Impingement syndrome of left shoulder 10/02/2016   Trochanteric bursitis of left hip 10/02/2016   SOB (shortness of breath) 04/16/2016   Uterine prolapse 04/09/2016   Cystocele 04/09/2016   Hyponatremia 04/09/2016    Benign essential HTN 11/18/2015   Paroxysmal supraventricular tachycardia (HCC) 11/18/2015   Premature ventricular contraction 07/20/2014   History of colonic polyps 05/29/2014   Asthma 02/05/2014   Chest pain 02/05/2014   Increased frequency of urination 02/05/2014   Tachycardia 02/05/2014   Female stress incontinence 12/14/2013   Gross hematuria 12/14/2013   Incomplete emptying of bladder 12/14/2013   Other chronic cystitis without hematuria 12/14/2013    Past Surgical History:  Procedure Laterality Date   BACK SURGERY  2014   removed bone to get to a benign tumor that was pushing on spinal column   BIOPSY THYROID  2018   bladder biopsies  2003   9 biopsies done by dr. cope.  all benign.  inflammatory process going on in bladder   BREAST BIOPSY Right    benign   BREAST SURGERY Right    lumpectomy   CATARACT EXTRACTION W/PHACO Left 12/25/2014   Procedure: CATARACT EXTRACTION PHACO AND INTRAOCULAR LENS PLACEMENT (IOC);  Surgeon: Galen Manila, MD;  Location: [facility] ORS;  Service: Ophthalmology;  Laterality: Left;  [place] 00:32 AP% 20.0 CDE 6.49   COLONOSCOPY W/ BIOPSIES     COLONOSCOPY W/ POLYPECTOMY  2002, 2004, 2005   adenomatous polyps removed   COLONOSCOPY WITH PROPOFOL N/A 08/23/2017   Procedure: COLONOSCOPY WITH PROPOFOL;  Surgeon: Scot Jun, MD;  Location: [facility] ENDOSCOPY;  Service: Endoscopy;  Laterality: N/A;   COLONOSCOPY WITH PROPOFOL N/A 11/08/2017   Procedure: COLONOSCOPY WITH PROPOFOL;  Surgeon: Scot Jun, MD;  Location: [facility] ENDOSCOPY;  Service: Endoscopy;  Laterality: N/A;   COLONOSCOPY WITH PROPOFOL N/A 06/30/2021   Procedure: COLONOSCOPY WITH PROPOFOL;  Surgeon: Regis Bill, MD;  Location: [facility] ENDOSCOPY;  Service: Endoscopy;  Laterality: N/A;   CYSTOCELE REPAIR N/A 04/09/2016   Procedure: ANTERIOR REPAIR (CYSTOCELE);  Surgeon: Nadara Mustard, MD;  Location: [facility] ORS;  Service: Gynecology;  Laterality: N/A;   DIAGNOSTIC LAPAROSCOPY  2008    removed both ovaries with cysts, tubes and fibroids   DILATION AND CURETTAGE OF UTERUS  1973   ESOPHAGOGASTRODUODENOSCOPY (EGD) WITH PROPOFOL N/A 08/23/2017   Procedure: ESOPHAGOGASTRODUODENOSCOPY (EGD) WITH PROPOFOL;  Surgeon: Scot Jun, MD;  Location: [facility] ENDOSCOPY;  Service: Endoscopy;  Laterality: N/A;   ESOPHAGOGASTRODUODENOSCOPY (EGD) WITH PROPOFOL N/A 06/30/2021   Procedure: ESOPHAGOGASTRODUODENOSCOPY (EGD) WITH PROPOFOL;  Surgeon: Regis Bill, MD;  Location: [facility] ENDOSCOPY;  Service: Endoscopy;  Laterality: N/A;   EYE SURGERY Bilateral 2016   HERNIA REPAIR Right 2008   Inguinal hernia Repair, ventral hernia repair   IR KYPHO THORACIC WITH BONE BIOPSY  05/12/2022   IR RADIOLOGIST EVAL & MGMT  05/19/2022   IR RADIOLOGIST EVAL & MGMT  05/26/2022   JOINT REPLACEMENT Right 2008   knee   KNEE ARTHROSCOPY Right    LUMBAR LAMINECTOMY/DECOMPRESSION MICRODISCECTOMY Right 03/21/2013   Procedure: Right Lumbar five-Sacral one Laminectomy for Synovial Cyst;  Surgeon: Reinaldo Meeker, MD;  Location: [facility];  Service: Neurosurgery;  Laterality: Right;  right   OOPHORECTOMY     REVERSE SHOULDER ARTHROPLASTY Right 02/22/2018  Procedure: REVERSE SHOULDER ARTHROPLASTY;  Surgeon: Christena Flake, MD;  Location: [facility] OR[facility];  Service: Orthopedics;  Laterality: Right;   TUBAL LIGATION     UNILATERAL SALPINGECTOMY Left 04/09/2016   Procedure: UNILATERAL SALPINGECTOMY;  Surgeon: Nadara Mustard, MD;  Location: [facility] OR[facility];  Service: Gynecology;  Laterality: Left;   UPPER GI ENDOSCOPY  2010   with biopsy of gastric erosion   VAGINAL HYSTERECTOMY N/A 04/09/2016   Procedure: HYSTERECTOMY VAGINAL;  Surgeon: Nadara Mustard, MD;  Location: [facility] ORS;  Service: Gynecology;  Laterality: N/A;   VAGINAL HYSTERECTOMY  2017   and bladder tack    OB History   No obstetric history on file.      Home Medications    Prior to Admission medications   Medication Sig Start Date End Date Taking?  Authorizing Provider  benzonatate (TESSALON) 100 MG capsule Take 2 capsules (200 mg total) by mouth every 8 (eight) hours. 09/08/23  Yes Becky Augusta, NP  ipratropium (ATROVENT) 0.06 % nasal spray Place 2 sprays into both nostrils 4 (four) times daily. 09/08/23  Yes Becky Augusta, NP  promethazine-dextromethorphan (PROMETHAZINE-DM) 6.25-15 MG/5ML syrup Take 5 mLs by mouth 4 (four) times daily as needed. 09/08/23  Yes Becky Augusta, NP  acetaminophen (TYLENOL) 500 MG tablet Take 1,000 mg by mouth 2 (two) times daily as needed for moderate pain.    [provider]  albuterol (VENTOLIN HFA) 108 (90 Base) MCG/ACT inhaler Inhale 2 puffs into the lungs every 6 (six) hours as needed for wheezing or shortness of breath. 04/08/23   McDonough, Salomon Fick, PA-C  aspirin EC 81 MG tablet Take 81 mg by mouth at bedtime.     [provider]  BLACK ELDERBERRY PO Take 1 Dose by mouth 2 (two) times a day.    [provider]  Calcium Carbonate-Vitamin D 600-400 MG-UNIT tablet Take 1 tablet by mouth 2 (two) times daily.    [provider]  cholecalciferol (VITAMIN D) 1000 UNITS tablet Take 1,000 Units by mouth at bedtime.     [provider]  citalopram (CELEXA) 20 MG tablet Take 1 tablet (20 mg total) by mouth daily. 06/10/23   McDonough, Salomon Fick, PA-C  cyanocobalamin 500 MCG tablet Take 500 mcg by mouth daily.    [provider]  Dextran 70-Hypromellose (ARTIFICIAL TEARS) 0.1-0.3 % SOLN Apply 1-2 drops to eye daily.    [provider]  diazepam (VALIUM) 5 MG tablet Take 1-2 tablets (5-10 mg total) by mouth once as needed for up to 1 dose (anxiety due to medical procedure). 03/16/23   Sallyanne Kuster, NP  diclofenac Sodium (VOLTAREN) 1 % GEL Apply 4 g topically 4 (four) times daily. 04/08/23   McDonough, Salomon Fick, PA-C  diltiazem (CARDIZEM CD) 120 MG 24 hr capsule Take 180 mg by mouth at bedtime. 05/18/16   [provider]  diphenhydrAMINE (BENADRYL) 25 MG  tablet Take 25-50 mg by mouth daily as needed for itching.    [provider]  docusate sodium (COLACE) 100 MG capsule Take 200 mg by mouth at bedtime as needed for moderate constipation.     [provider]  EPINEPHrine (EPIPEN 2-PAK) 0.3 mg/0.3 mL IJ SOAJ injection use as directed for severe allergy reaction 03/12/20   Yevonne Pax, MD  fenofibrate 160 MG tablet TAKE 1 TABLET DAILY FOR CHOLESTEROL 08/10/23   McDonough, Salomon Fick, PA-C  fluticasone (FLONASE) 50 MCG/ACT nasal spray Use 2 sprays in each nostril daily, 07/12/23  McDonough, Lauren K, PA-C  folic acid (FOLVITE) 400 MCG tablet Take 400 mcg by mouth daily.    [provider]  guaiFENesin (MUCINEX) 600 MG 12 hr tablet Take 600 mg by mouth at bedtime as needed (congestion).    [provider]  Hypromellose (ARTIFICIAL TEARS OP) Place 1-2 drops into both eyes daily as needed (dry eyes).    [provider]  ipratropium-albuterol (DUONEB) 0.5-2.5 (3) MG/3ML SOLN Take 3 mLs by nebulization every 6 (six) hours as needed. 09/06/23   McDonough, Salomon Fick, PA-C  ketotifen (ZADITOR) 0.025 % ophthalmic solution Place 1 drop into both eyes 2 (two) times daily as needed (allergies).    [provider]  levocetirizine (XYZAL) 5 MG tablet Take 1 tablet (5 mg total) by mouth every evening. 07/19/23   McDonough, Salomon Fick, PA-C  levothyroxine (SYNTHROID, LEVOTHROID) 50 MCG tablet Take 50 mcg by mouth daily before breakfast. Brand Name only    [provider]  losartan (COZAAR) 25 MG tablet Take 25 mg by mouth daily. 07/22/21   [provider]  Magnesium 250 MG TABS Take 1 tablet by mouth daily.    [provider]  Melatonin 5 MG TABS Take 1 tablet by mouth at bedtime. For sleep    [provider]  metoprolol tartrate (LOPRESSOR) 25 MG tablet Take 12.5 mg by mouth daily.    [provider]  Multiple Vitamins-Minerals (MULTIVITAMIN WITH MINERALS) tablet Take 1 tablet  by mouth daily.    [provider]  oseltamivir (TAMIFLU) 75 MG capsule Take 1 capsule (75 mg total) by mouth 2 (two) times daily. 09/06/23   McDonough, Salomon Fick, PA-C  OXYGEN Inhale into the lungs. 2 litre at night    [provider]  pantoprazole (PROTONIX) 40 MG tablet Take 1 tablet (40 mg total) by mouth daily. 11/30/22   McDonough, Salomon Fick, PA-C  pyridoxine (B-6) 100 MG tablet Take 100 mg by mouth daily.    [provider]  raloxifene (EVISTA) 60 MG tablet Take 60 mg by mouth daily.    [provider]  rosuvastatin (CRESTOR) 5 MG tablet Take 1 tablet (5 mg total) by mouth daily. 09/14/22   McDonough, Salomon Fick, PA-C  sodium chloride (OCEAN) 0.65 % SOLN nasal spray Place 1 spray into both nostrils 2 (two) times daily.    [provider]  sucralfate (CARAFATE) 1 g tablet Take 3 times daily with meals. 09/09/20   McDonough, Salomon Fick, PA-C  Vitamin A 7.5 MG (25000 UT) CAPS Take 1 tablet by mouth 2 (two) times daily.    [provider]  zinc gluconate 50 MG tablet Take 50 mg by mouth daily.    [provider]    Family History Family History  Problem Relation Age of Onset   Pulmonary fibrosis Mother    Melanoma Father    COPD Sister    Cardiomyopathy Sister        and arrhythmia   Atrial fibrillation Sister    COPD Brother    Pulmonary fibrosis Brother    Cancer Brother    Breast cancer Cousin        paternal 1st    Social History Social History   Tobacco Use   Smoking status: Former    Current packs/day: 0.00    Types: Cigarettes    Quit date: 1967    Years since quitting: 58.1   Smokeless tobacco: Never  Vaping Use   Vaping status: Never Used  Substance Use Topics   Alcohol use: Yes    Comment: occasionally   Drug use: No     Allergies   Codeine, Levofloxacin, Oxycodone, Ultram [tramadol], Nitrofurantoin, Nsaids, Sulfacetamide, Adhesive [tape], Azithromycin, Ciprofloxacin, Ketorolac, Sulfa antibiotics,  Tolmetin, and Zofran [ondansetron hcl]   Review of Systems Review of Systems  Constitutional:  Positive for fever.  HENT:  Positive for congestion, ear pain and rhinorrhea.   Respiratory:  Positive for cough, shortness of breath and wheezing.   Gastrointestinal:  Positive for nausea and vomiting.     Physical Exam Triage Vital Signs ED Triage Vitals  Encounter Vitals Group     BP 09/08/23 1509 (!) 146/81     Systolic BP Percentile --      Diastolic BP Percentile --      Pulse Rate 09/08/23 1509 (!) 101     Resp 09/08/23 1509 18     Temp 09/08/23 1509 98.7 F (37.1 C)     Temp Source 09/08/23 1509 Oral     SpO2 09/08/23 1509 91 %     Weight --      Height --      Head Circumference --      Peak Flow --      Pain Score 09/08/23 1508 8     Pain Loc --      Pain Education --      Exclude from Growth Chart --    No data found.  Updated Vital Signs BP (!) 146/81 (BP Location: Left Arm)   Pulse (!) 101   Temp 98.7 F (37.1 C) (Oral)   Resp 18   SpO2 91%   Visual Acuity Right Eye Distance:   Left Eye Distance:   Bilateral Distance:    Right Eye Near:   Left Eye Near:    Bilateral Near:     Physical Exam Vitals and nursing note reviewed.  Constitutional:      Appearance: Normal appearance. She is ill-appearing.  HENT:     Head: Normocephalic and atraumatic.     Right Ear: Tympanic membrane, ear canal and external ear normal. There is no impacted cerumen.     Left Ear: Tympanic membrane, ear canal and external ear normal. There is no impacted cerumen.     Nose: Congestion and rhinorrhea present.     Comments: Nasal mucosa is erythematous and edematous with scant clear discharge in both nares.    Mouth/Throat:     Mouth: Mucous membranes are moist.     Pharynx: Oropharynx is clear. No oropharyngeal exudate or posterior oropharyngeal erythema.  Cardiovascular:     Rate and Rhythm: Normal rate and regular rhythm.     Pulses: Normal pulses.     Heart sounds:  Normal heart sounds. No murmur heard.    No friction rub. No gallop.  Pulmonary:     Effort: Pulmonary effort is normal.     Breath sounds: Normal breath sounds. No wheezing, rhonchi or rales.  Musculoskeletal:     Cervical back: Normal range of motion and neck supple. No tenderness.  Lymphadenopathy:     Cervical: No cervical adenopathy.  Skin:    General: Skin is warm and dry.     Capillary Refill: Capillary refill takes less than 2 seconds.     Findings: No erythema or rash.  Neurological:     General: No focal deficit present.     Mental Status: She is alert and oriented to person, place, and time.  UC Treatments / Results  Labs (all labs ordered are listed, but only abnormal results are displayed) Labs Reviewed - No data to display  EKG   Radiology No results found.  Procedures Procedures (including critical care time)  Medications Ordered in UC Medications - No data to display  Initial Impression / Assessment and Plan / UC Course  I have reviewed the triage vital signs and the nursing notes.  Pertinent labs & imaging results that were available during my care of the patient were reviewed by me and considered in my medical decision making (see chart for details).   Patient is a nontoxic, though mildly ill-appearing, 78 year old female presenting evaluation of worsening respiratory symptoms over the last 3 days as outlined HPI above.  She states she is short of breath and wheezing though she is able to speak in full sentence without dyspnea or tachypnea.  Room air oxygen saturation is 91%.  Respiratory rate is 18.  She is afebrile with an oral temp of 98.7.  Physical exam does reveal inflammation of her upper respiratory tract though her nasal discharge is minimal and clear.  Oropharyngeal exam is benign.  No cervical lymphadenopathy present on exam.  Cardiopulmonary exam reveals clear lung sounds in all fields.  I will obtain a radiograph of the patient's chest to  evaluate for any acute cardiopulmonary pathology.  Chest x-ray independently reviewed and evaluated by me.  Impression: There is a blunting of bilateral costophrenic angles.  No definitive infiltrate or effusion noted.  Cardiomediastinal silhouette appears normal.  There is a right shoulder prosthesis present.  Radiology overread is pending. Radiology impression states no acute cardiopulmonary process.  I will discharge patient home with a diagnosis of flulike illness and have her continue taking her Tamiflu.  I will also prescribe Atrovent nasal spray to help with the nasal congestion and postnasal drip.  Tessalon Perles and Promethazine DM cough syrup for cough congestion.  Patient should continue use over-the-counter Tylenol as needed for fever or aches.  She is prescribed an albuterol inhaler and I will have her continue to use this every 4-6 hours as needed for any shortness of breath or wheezing.   Final Clinical Impressions(s) / UC Diagnoses   Final diagnoses:  Acute cough  Influenza-like illness     Discharge Instructions      Your chest x-ray did not show any evidence of pneumonia.  I believe your symptoms are being caused by influenza.  Continue to take the Tamiflu as previously prescribed by your primary care provider.  Use your albuterol inhaler, 1 to 2 puffs every 4-6 hours, as needed for any shortness of breath or wheezing.  Use the Atrovent nasal spray, 2 squirts in each nostril every 6 hours, as needed for runny nose and postnasal drip.  Use the Tessalon Perles every 8 hours during the day.  Take them with a small sip of water.  They may give you some numbness to the base of your tongue or a metallic taste in your mouth, this is normal.  Use the Promethazine DM cough syrup at bedtime for cough and congestion.  It will make you drowsy so do not take it during the day.  Return for reevaluation or see your primary care provider for any new or worsening symptoms.       ED Prescriptions     Medication Sig Dispense Auth. Provider   benzonatate (TESSALON) 100 MG capsule Take 2 capsules (200 mg total) by mouth every 8 (eight) hours. 21  capsule Becky Augusta, NP   ipratropium (ATROVENT) 0.06 % nasal spray Place 2 sprays into both nostrils 4 (four) times daily. 15 mL Becky Augusta, NP   promethazine-dextromethorphan (PROMETHAZINE-DM) 6.25-15 MG/5ML syrup Take 5 mLs by mouth 4 (four) times daily as needed. 118 mL Becky Augusta, NP      PDMP not reviewed this encounter.   Becky Augusta, NP 09/08/23 1649    Becky Augusta, NP 09/08/23 [id number]

## 2023-09-08 NOTE — Discharge Instructions (Addendum)
Your chest x-ray did not show any evidence of pneumonia.  I believe your symptoms are being caused by influenza.  Continue to take the Tamiflu as previously prescribed by your primary care provider.  Use your albuterol inhaler, 1 to 2 puffs every 4-6 hours, as needed for any shortness of breath or wheezing.  Use the Atrovent nasal spray, 2 squirts in each nostril every 6 hours, as needed for runny nose and postnasal drip.  Use the Tessalon Perles every 8 hours during the day.  Take them with a small sip of water.  They may give you some numbness to the base of your tongue or a metallic taste in your mouth, this is normal.  Use the Promethazine DM cough syrup at bedtime for cough and congestion.  It will make you drowsy so do not take it during the day.  Return for reevaluation or see your primary care provider for any new or worsening symptoms.

## 2023-09-10 ENCOUNTER — Other Ambulatory Visit: Payer: Self-pay

## 2023-09-10 ENCOUNTER — Inpatient Hospital Stay
Admission: EM | Admit: 2023-09-10 | Discharge: 2023-09-17 | DRG: 193 | Disposition: A | Discharge status: Home Health | Payer: Medicare Other | Attending: Student | Admitting: Student

## 2023-09-10 ENCOUNTER — Emergency Department: Payer: Medicare Other

## 2023-09-10 ENCOUNTER — Inpatient Hospital Stay: Payer: Medicare Other

## 2023-09-10 DIAGNOSIS — E785 Hyperlipidemia, unspecified: Secondary | ICD-10-CM | POA: Diagnosis present

## 2023-09-10 DIAGNOSIS — E039 Hypothyroidism, unspecified: Secondary | ICD-10-CM | POA: Diagnosis present

## 2023-09-10 DIAGNOSIS — Z888 Allergy status to other drugs, medicaments and biological substances status: Secondary | ICD-10-CM

## 2023-09-10 DIAGNOSIS — Z79890 Hormone replacement therapy (postmenopausal): Secondary | ICD-10-CM | POA: Diagnosis not present

## 2023-09-10 DIAGNOSIS — Z886 Allergy status to analgesic agent status: Secondary | ICD-10-CM | POA: Diagnosis not present

## 2023-09-10 DIAGNOSIS — E861 Hypovolemia: Secondary | ICD-10-CM | POA: Diagnosis present

## 2023-09-10 DIAGNOSIS — Z7951 Long term (current) use of inhaled steroids: Secondary | ICD-10-CM | POA: Diagnosis not present

## 2023-09-10 DIAGNOSIS — Z882 Allergy status to sulfonamides status: Secondary | ICD-10-CM

## 2023-09-10 DIAGNOSIS — J9621 Acute and chronic respiratory failure with hypoxia: Secondary | ICD-10-CM | POA: Diagnosis present

## 2023-09-10 DIAGNOSIS — Z79899 Other long term (current) drug therapy: Secondary | ICD-10-CM

## 2023-09-10 DIAGNOSIS — I110 Hypertensive heart disease with heart failure: Secondary | ICD-10-CM | POA: Diagnosis present

## 2023-09-10 DIAGNOSIS — Z7982 Long term (current) use of aspirin: Secondary | ICD-10-CM

## 2023-09-10 DIAGNOSIS — Z87891 Personal history of nicotine dependence: Secondary | ICD-10-CM | POA: Diagnosis not present

## 2023-09-10 DIAGNOSIS — I10 Essential (primary) hypertension: Secondary | ICD-10-CM | POA: Diagnosis present

## 2023-09-10 DIAGNOSIS — Z803 Family history of malignant neoplasm of breast: Secondary | ICD-10-CM

## 2023-09-10 DIAGNOSIS — J9601 Acute respiratory failure with hypoxia: Secondary | ICD-10-CM | POA: Diagnosis not present

## 2023-09-10 DIAGNOSIS — I25119 Atherosclerotic heart disease of native coronary artery with unspecified angina pectoris: Secondary | ICD-10-CM | POA: Diagnosis present

## 2023-09-10 DIAGNOSIS — J101 Influenza due to other identified influenza virus with other respiratory manifestations: Secondary | ICD-10-CM | POA: Diagnosis present

## 2023-09-10 DIAGNOSIS — Z90710 Acquired absence of both cervix and uterus: Secondary | ICD-10-CM

## 2023-09-10 DIAGNOSIS — J1000 Influenza due to other identified influenza virus with unspecified type of pneumonia: Secondary | ICD-10-CM | POA: Diagnosis present

## 2023-09-10 DIAGNOSIS — J4541 Moderate persistent asthma with (acute) exacerbation: Secondary | ICD-10-CM | POA: Diagnosis present

## 2023-09-10 DIAGNOSIS — K5900 Constipation, unspecified: Secondary | ICD-10-CM | POA: Diagnosis present

## 2023-09-10 DIAGNOSIS — Z7901 Long term (current) use of anticoagulants: Secondary | ICD-10-CM | POA: Diagnosis not present

## 2023-09-10 DIAGNOSIS — Z825 Family history of asthma and other chronic lower respiratory diseases: Secondary | ICD-10-CM

## 2023-09-10 DIAGNOSIS — K219 Gastro-esophageal reflux disease without esophagitis: Secondary | ICD-10-CM | POA: Diagnosis present

## 2023-09-10 DIAGNOSIS — J09X1 Influenza due to identified novel influenza A virus with pneumonia: Secondary | ICD-10-CM | POA: Diagnosis not present

## 2023-09-10 DIAGNOSIS — E871 Hypo-osmolality and hyponatremia: Secondary | ICD-10-CM | POA: Diagnosis present

## 2023-09-10 DIAGNOSIS — R0902 Hypoxemia: Principal | ICD-10-CM

## 2023-09-10 DIAGNOSIS — Z881 Allergy status to other antibiotic agents status: Secondary | ICD-10-CM

## 2023-09-10 DIAGNOSIS — J45901 Unspecified asthma with (acute) exacerbation: Secondary | ICD-10-CM | POA: Diagnosis not present

## 2023-09-10 DIAGNOSIS — I5030 Unspecified diastolic (congestive) heart failure: Secondary | ICD-10-CM

## 2023-09-10 DIAGNOSIS — Z885 Allergy status to narcotic agent status: Secondary | ICD-10-CM

## 2023-09-10 DIAGNOSIS — Z8249 Family history of ischemic heart disease and other diseases of the circulatory system: Secondary | ICD-10-CM

## 2023-09-10 DIAGNOSIS — I5032 Chronic diastolic (congestive) heart failure: Secondary | ICD-10-CM | POA: Diagnosis present

## 2023-09-10 DIAGNOSIS — Z96611 Presence of right artificial shoulder joint: Secondary | ICD-10-CM | POA: Diagnosis present

## 2023-09-10 DIAGNOSIS — I4891 Unspecified atrial fibrillation: Secondary | ICD-10-CM | POA: Diagnosis present

## 2023-09-10 DIAGNOSIS — J470 Bronchiectasis with acute lower respiratory infection: Secondary | ICD-10-CM | POA: Diagnosis present

## 2023-09-10 DIAGNOSIS — M797 Fibromyalgia: Secondary | ICD-10-CM | POA: Diagnosis present

## 2023-09-10 DIAGNOSIS — Z808 Family history of malignant neoplasm of other organs or systems: Secondary | ICD-10-CM

## 2023-09-10 DIAGNOSIS — R0602 Shortness of breath: Secondary | ICD-10-CM | POA: Diagnosis present

## 2023-09-10 LAB — TSH: TSH: 1.447 u[IU]/mL (ref 0.350–4.500)

## 2023-09-10 LAB — CBC
HCT: 38.4 % (ref 36.0–46.0)
Hemoglobin: 12.4 g/dL (ref 12.0–15.0)
MCH: 30.3 pg (ref 26.0–34.0)
MCHC: 32.3 g/dL (ref 30.0–36.0)
MCV: 93.9 fL (ref 80.0–100.0)
Platelets: 223 10*3/uL (ref 150–400)
RBC: 4.09 MIL/uL (ref 3.87–5.11)
RDW: 12.8 % (ref 11.5–15.5)
WBC: 5.8 10*3/uL (ref 4.0–10.5)
nRBC: 0 % (ref 0.0–0.2)

## 2023-09-10 LAB — BASIC METABOLIC PANEL
Anion gap: 12 (ref 5–15)
BUN: 13 mg/dL (ref 8–23)
CO2: 26 mmol/L (ref 22–32)
Calcium: 8.8 mg/dL — ABNORMAL LOW (ref 8.9–10.3)
Chloride: 96 mmol/L — ABNORMAL LOW (ref 98–111)
Creatinine, Ser: 0.67 mg/dL (ref 0.44–1.00)
GFR, Estimated: >60 mL/min (ref >60)
Glucose, Bld: 101 mg/dL — ABNORMAL HIGH (ref 70–99)
Potassium: 3.9 mmol/L (ref 3.5–5.1)
Sodium: 134 mmol/L — ABNORMAL LOW (ref 135–145)

## 2023-09-10 LAB — RESP PANEL BY RT-PCR (RSV, FLU A&B, COVID)  RVPGX2
Influenza A by PCR: POSITIVE — AB
Influenza B by PCR: NEGATIVE
Resp Syncytial Virus by PCR: NEGATIVE
SARS Coronavirus 2 by RT PCR: NEGATIVE

## 2023-09-10 LAB — TROPONIN I (HIGH SENSITIVITY)
Troponin I (High Sensitivity): 8 ng/L (ref <18)
Troponin I (High Sensitivity): 38 ng/L — ABNORMAL HIGH (ref <18)

## 2023-09-10 LAB — LACTIC ACID, PLASMA: Lactic Acid, Venous: 0.8 mmol/L (ref 0.5–1.9)

## 2023-09-10 LAB — PROCALCITONIN: Procalcitonin: <0.1 ng/mL

## 2023-09-10 MED ORDER — OYSTER SHELL CALCIUM/D3 500-5 MG-MCG PO TABS
1.0000 | ORAL_TABLET | Freq: Two times a day (BID) | ORAL | Status: DC
  Administered 2023-09-10 – 2023-09-17 (×13): 1 via ORAL
  Filled 2023-09-10 (×15): qty 1

## 2023-09-10 MED ORDER — FAMOTIDINE 20 MG PO TABS
20.0000 mg | ORAL_TABLET | Freq: Two times a day (BID) | ORAL | Status: DC
  Administered 2023-09-10 – 2023-09-12 (×4): 20 mg via ORAL
  Filled 2023-09-10 (×4): qty 1

## 2023-09-10 MED ORDER — ASPIRIN 81 MG PO TBEC
81.0000 mg | DELAYED_RELEASE_TABLET | Freq: Every day | ORAL | Status: DC
  Administered 2023-09-10 – 2023-09-16 (×7): 81 mg via ORAL
  Filled 2023-09-10 (×7): qty 1

## 2023-09-10 MED ORDER — FENOFIBRATE 160 MG PO TABS
160.0000 mg | ORAL_TABLET | Freq: Every day | ORAL | Status: DC
  Administered 2023-09-11 – 2023-09-17 (×6): 160 mg via ORAL
  Filled 2023-09-10 (×7): qty 1

## 2023-09-10 MED ORDER — LACTATED RINGERS IV SOLN: INTRAVENOUS | Status: AC

## 2023-09-10 MED ORDER — ENOXAPARIN SODIUM 40 MG/0.4ML IJ SOSY
40.0000 mg | PREFILLED_SYRINGE | INTRAMUSCULAR | Status: DC
  Administered 2023-09-10: 40 mg via SUBCUTANEOUS
  Filled 2023-09-10: qty 0.4

## 2023-09-10 MED ORDER — OSELTAMIVIR PHOSPHATE 30 MG PO CAPS
30.0000 mg | ORAL_CAPSULE | Freq: Two times a day (BID) | ORAL | Status: AC
  Administered 2023-09-10 – 2023-09-15 (×9): 30 mg via ORAL
  Filled 2023-09-10 (×10): qty 1

## 2023-09-10 MED ORDER — LACTATED RINGERS IV SOLN: INTRAVENOUS | Status: DC

## 2023-09-10 MED ORDER — VITAMIN D 25 MCG (1000 UNIT) PO TABS
1000.0000 [IU] | ORAL_TABLET | Freq: Every day | ORAL | Status: DC
  Administered 2023-09-10 – 2023-09-16 (×6): 1000 [IU] via ORAL
  Filled 2023-09-10 (×7): qty 1

## 2023-09-10 MED ORDER — SODIUM CHLORIDE 0.9% FLUSH
3.0000 mL | Freq: Two times a day (BID) | INTRAVENOUS | Status: DC
  Administered 2023-09-10 – 2023-09-17 (×13): 3 mL via INTRAVENOUS

## 2023-09-10 MED ORDER — SODIUM CHLORIDE 0.9 % IV SOLN: 12.5000 mg | Freq: Four times a day (QID) | INTRAVENOUS | Status: DC | PRN

## 2023-09-10 MED ORDER — LOSARTAN POTASSIUM 25 MG PO TABS
25.0000 mg | ORAL_TABLET | Freq: Every day | ORAL | Status: DC
  Administered 2023-09-11 – 2023-09-17 (×7): 25 mg via ORAL
  Filled 2023-09-10 (×7): qty 1

## 2023-09-10 MED ORDER — METHYLPREDNISOLONE SODIUM SUCC 40 MG IJ SOLR
40.0000 mg | Freq: Every day | INTRAMUSCULAR | Status: DC
  Administered 2023-09-11 – 2023-09-15 (×5): 40 mg via INTRAVENOUS
  Filled 2023-09-10 (×5): qty 1

## 2023-09-10 MED ORDER — ROSUVASTATIN CALCIUM 5 MG PO TABS
5.0000 mg | ORAL_TABLET | Freq: Every day | ORAL | Status: DC
  Administered 2023-09-11 – 2023-09-17 (×7): 5 mg via ORAL
  Filled 2023-09-10 (×8): qty 1

## 2023-09-10 MED ORDER — IPRATROPIUM-ALBUTEROL 0.5-2.5 (3) MG/3ML IN SOLN
3.0000 mL | Freq: Four times a day (QID) | RESPIRATORY_TRACT | Status: DC
  Administered 2023-09-10 – 2023-09-16 (×22): 3 mL via RESPIRATORY_TRACT
  Filled 2023-09-10 (×23): qty 3

## 2023-09-10 MED ORDER — METHYLPREDNISOLONE SODIUM SUCC 125 MG IJ SOLR
125.0000 mg | Freq: Once | INTRAMUSCULAR | Status: AC
  Administered 2023-09-10: 125 mg via INTRAVENOUS
  Filled 2023-09-10: qty 2

## 2023-09-10 MED ORDER — BUDESONIDE 0.5 MG/2ML IN SUSP
0.5000 mg | Freq: Two times a day (BID) | RESPIRATORY_TRACT | Status: DC
  Administered 2023-09-10 – 2023-09-17 (×13): 0.5 mg via RESPIRATORY_TRACT
  Filled 2023-09-10 (×14): qty 2

## 2023-09-10 MED ORDER — CITALOPRAM HYDROBROMIDE 10 MG PO TABS
20.0000 mg | ORAL_TABLET | Freq: Every day | ORAL | Status: DC
  Administered 2023-09-11 – 2023-09-17 (×7): 20 mg via ORAL
  Filled 2023-09-10 (×2): qty 2
  Filled 2023-09-10 (×2): qty 1
  Filled 2023-09-10 (×3): qty 2

## 2023-09-10 MED ORDER — ACETAMINOPHEN 325 MG PO TABS
650.0000 mg | ORAL_TABLET | Freq: Four times a day (QID) | ORAL | Status: DC | PRN
  Administered 2023-09-11: 650 mg via ORAL
  Filled 2023-09-10: qty 2

## 2023-09-10 MED ORDER — OSELTAMIVIR PHOSPHATE 75 MG PO CAPS: 75.0000 mg | ORAL_CAPSULE | Freq: Two times a day (BID) | ORAL | Status: DC

## 2023-09-10 MED ORDER — VITAMIN B-12 1000 MCG PO TABS
500.0000 ug | ORAL_TABLET | Freq: Every day | ORAL | Status: DC
Start: 2023-09-11 — End: 2023-09-17
  Administered 2023-09-11 – 2023-09-17 (×7): 500 ug via ORAL
  Filled 2023-09-10 (×7): qty 1

## 2023-09-10 MED ORDER — SODIUM CHLORIDE 0.9 % IV BOLUS
1000.0000 mL | Freq: Once | INTRAVENOUS | Status: AC
  Administered 2023-09-10: 1000 mL via INTRAVENOUS

## 2023-09-10 MED ORDER — GUAIFENESIN ER 600 MG PO TB12
600.0000 mg | ORAL_TABLET | Freq: Two times a day (BID) | ORAL | Status: DC
  Administered 2023-09-10 – 2023-09-17 (×13): 600 mg via ORAL
  Filled 2023-09-10 (×14): qty 1

## 2023-09-10 MED ORDER — HYDROCOD POLI-CHLORPHE POLI ER 10-8 MG/5ML PO SUER: 5.0000 mL | Freq: Two times a day (BID) | ORAL | Status: DC | PRN

## 2023-09-10 MED ORDER — DILTIAZEM HCL-DEXTROSE 125-5 MG/125ML-% IV SOLN (PREMIX)
5.0000 mg/h | INTRAVENOUS | Status: DC
  Administered 2023-09-10: 7.5 mg/h via INTRAVENOUS
  Administered 2023-09-10: 5 mg/h via INTRAVENOUS
  Administered 2023-09-11: 7.5 mg/h via INTRAVENOUS
  Filled 2023-09-10 (×2): qty 125

## 2023-09-10 MED ORDER — ALBUTEROL SULFATE (2.5 MG/3ML) 0.083% IN NEBU
2.5000 mg | INHALATION_SOLUTION | RESPIRATORY_TRACT | Status: DC | PRN
  Administered 2023-09-10 – 2023-09-12 (×3): 2.5 mg via RESPIRATORY_TRACT
  Filled 2023-09-10 (×3): qty 3

## 2023-09-10 MED ORDER — RALOXIFENE HCL 60 MG PO TABS
60.0000 mg | ORAL_TABLET | Freq: Every day | ORAL | Status: DC
  Administered 2023-09-11 – 2023-09-17 (×6): 60 mg via ORAL
  Filled 2023-09-10 (×7): qty 1

## 2023-09-10 MED ORDER — FOLIC ACID 1 MG PO TABS
500.0000 ug | ORAL_TABLET | Freq: Every day | ORAL | Status: DC
  Administered 2023-09-11 – 2023-09-15 (×2): 0.5 mg via ORAL
  Filled 2023-09-10 (×6): qty 1

## 2023-09-10 MED ORDER — KETOTIFEN FUMARATE 0.035 % OP SOLN: 1.0000 [drp] | Freq: Two times a day (BID) | OPHTHALMIC | Status: DC | PRN

## 2023-09-10 MED ORDER — IPRATROPIUM-ALBUTEROL 0.5-2.5 (3) MG/3ML IN SOLN
6.0000 mL | Freq: Once | RESPIRATORY_TRACT | Status: AC
  Administered 2023-09-10: 6 mL via RESPIRATORY_TRACT
  Filled 2023-09-10: qty 3

## 2023-09-10 MED ORDER — LEVOTHYROXINE SODIUM 50 MCG PO TABS
50.0000 ug | ORAL_TABLET | Freq: Every day | ORAL | Status: DC
  Administered 2023-09-11 – 2023-09-17 (×7): 50 ug via ORAL
  Filled 2023-09-10 (×7): qty 1

## 2023-09-10 MED ORDER — METOPROLOL SUCCINATE ER 25 MG PO TB24
25.0000 mg | ORAL_TABLET | Freq: Every day | ORAL | Status: DC
  Administered 2023-09-11 – 2023-09-16 (×6): 25 mg via ORAL
  Filled 2023-09-10 (×6): qty 1

## 2023-09-10 MED ORDER — DOCUSATE SODIUM 100 MG PO CAPS
200.0000 mg | ORAL_CAPSULE | Freq: Every evening | ORAL | Status: DC | PRN
  Administered 2023-09-11 (×2): 200 mg via ORAL
  Filled 2023-09-10 (×2): qty 2

## 2023-09-10 NOTE — Assessment & Plan Note (Signed)
Patient presenting with acute hypoxic respiratory failure in the setting of influenza A and asthma exacerbation.  Further exacerbated after patient developed atrial fibrillation with RVR.  At baseline, patient wears 2 L only at bedtime, she has never required oxygen during the daytime.  Patient also notes a strong family history of pulmonary fibrosis, wonders if this may be contributing.  - Continue supplemental oxygen to maintain oxygen saturation above 88% - Wean as tolerated to home 2 L at bedtime - CT chest without contrast

## 2023-09-10 NOTE — Assessment & Plan Note (Addendum)
Patient reporting chest tightness, however likely demand in the setting of severe RVR.  Low suspicion for ACS.  Initial troponin negative, however will recheck.  Initial EKG with no ischemic changes, with nonspecific ischemic changes on repeat EKG, again likely due to demand.  - Continue home aspirin, statin - Repeat troponin pending

## 2023-09-10 NOTE — Sepsis Progress Note (Signed)
 Sepsis protocol monitored by eLink ?

## 2023-09-10 NOTE — Assessment & Plan Note (Signed)
History of HFpEF with EF above 55% and G1DD.  - Hold home as needed torsemide given hypovolemia - Daily weights - Strict in and out

## 2023-09-10 NOTE — H&P (Signed)
History and Physical    Patient: Vanessa Evans WGN:[id number] DOB: 05-11-1946 DOA: 09/10/2023 DOS: the patient was seen and examined on 09/10/2023 PCP: Carlean Jews, PA-C  Patient coming from: Home  Chief Complaint:  Chief Complaint  Patient presents with   Shortness of Breath   HPI: Vanessa Evans is a 78 y.o. female with medical history significant of Mild intermittent asthma, hypothyroidism, iron deficiency anemia, nonobstructive CAD, hypertension, hyperlipidemia, paroxysmal SVT, HFpEF with G1 DD, who presents to the ED due to shortness of breath.  Mrs. Heinle states that everyone in her household had recently contracted influenza.  Approximately 4 days ago, she started to feel generalized malaise with bodyaches, nausea, vomiting, fever.  She was started on Tamiflu by her PCP when she tested positive for influenza at that time.  Her initial symptoms began to improve over the next few days, however for the last 1 to 2 days, she has been experiencing gradually worsening shortness of breath and intermittently productive cough.  Due to this, she came to the ED today.  ED course: On arrival to the ED, patient was normotensive at 138/74 with heart rate of 90 respiratory rate of 20.  She was saturating at 95% on 6 L.  She was afebrile at 98.4.  Initial workup notable for normal CBC and unremarkable BMP.  Troponin, lactic acid and procalcitonin negative.  Influenza A PCR positive.  Chest x-ray was obtained with streaky bibasilar opacities.  Patient started on DuoNebs, Solu-Medrol, and IV fluids.  TRH contacted for admission.   Review of Systems: As mentioned in the history of present illness. All other systems reviewed and are negative.  Past Medical History:  Diagnosis Date   Arthritis    osteo   Asthma    needs rescue inhaler few times a year   Claustrophobia 04/21/2020   Depression    Dizziness    light headed spells but it has been a while   Dysrhythmia     tachycardia.(cardizem). brady during procedures   Fibromyalgia    GERD (gastroesophageal reflux disease)    gastritis   Headache(784.0)    Heart murmur 2019   aortic. dr. Gwen Pounds not worried about this   Hyperlipidemia    Hypertension    Hypothyroidism    Macular degeneration    Migraine    Orthopnea    Pneumonia    long time ago (greater than 5 years ago)   PONV (postoperative nausea and vomiting)    very anxious during cataract surgery. brady during colonoscopy   Shortness of breath    any exertion, cannot lie flat   Past Surgical History:  Procedure Laterality Date   BACK SURGERY  2014   removed bone to get to a benign tumor that was pushing on spinal column   BIOPSY THYROID  2018   bladder biopsies  2003   9 biopsies done by dr. cope.  all benign.  inflammatory process going on in bladder   BREAST BIOPSY Right    benign   BREAST SURGERY Right    lumpectomy   CATARACT EXTRACTION W/PHACO Left 12/25/2014   Procedure: CATARACT EXTRACTION PHACO AND INTRAOCULAR LENS PLACEMENT (IOC);  Surgeon: Galen Manila, MD;  Location: [facility] ORS;  Service: Ophthalmology;  Laterality: Left;  [place] 00:32 AP% 20.0 CDE 6.49   COLONOSCOPY W/ BIOPSIES     COLONOSCOPY W/ POLYPECTOMY  2002, 2004, 2005   adenomatous polyps removed   COLONOSCOPY WITH PROPOFOL N/A 08/23/2017   Procedure: COLONOSCOPY WITH PROPOFOL;  Surgeon: Scot Jun, MD;  Location: [facility] ENDOSCOPY;  Service: Endoscopy;  Laterality: N/A;   COLONOSCOPY WITH PROPOFOL N/A 11/08/2017   Procedure: COLONOSCOPY WITH PROPOFOL;  Surgeon: Scot Jun, MD;  Location: [facility] ENDOSCOPY;  Service: Endoscopy;  Laterality: N/A;   COLONOSCOPY WITH PROPOFOL N/A 06/30/2021   Procedure: COLONOSCOPY WITH PROPOFOL;  Surgeon: Regis Bill, MD;  Location: [facility] ENDOSCOPY;  Service: Endoscopy;  Laterality: N/A;   CYSTOCELE REPAIR N/A 04/09/2016   Procedure: ANTERIOR REPAIR (CYSTOCELE);  Surgeon: Nadara Mustard, MD;  Location: [facility] ORS;   Service: Gynecology;  Laterality: N/A;   DIAGNOSTIC LAPAROSCOPY  2008   removed both ovaries with cysts, tubes and fibroids   DILATION AND CURETTAGE OF UTERUS  1973   ESOPHAGOGASTRODUODENOSCOPY (EGD) WITH PROPOFOL N/A 08/23/2017   Procedure: ESOPHAGOGASTRODUODENOSCOPY (EGD) WITH PROPOFOL;  Surgeon: Scot Jun, MD;  Location: [facility] ENDOSCOPY;  Service: Endoscopy;  Laterality: N/A;   ESOPHAGOGASTRODUODENOSCOPY (EGD) WITH PROPOFOL N/A 06/30/2021   Procedure: ESOPHAGOGASTRODUODENOSCOPY (EGD) WITH PROPOFOL;  Surgeon: Regis Bill, MD;  Location: [facility] ENDOSCOPY;  Service: Endoscopy;  Laterality: N/A;   EYE SURGERY Bilateral 2016   HERNIA REPAIR Right 2008   Inguinal hernia Repair, ventral hernia repair   IR KYPHO THORACIC WITH BONE BIOPSY  05/12/2022   IR RADIOLOGIST EVAL & MGMT  05/19/2022   IR RADIOLOGIST EVAL & MGMT  05/26/2022   JOINT REPLACEMENT Right 2008   knee   KNEE ARTHROSCOPY Right    LUMBAR LAMINECTOMY/DECOMPRESSION MICRODISCECTOMY Right 03/21/2013   Procedure: Right Lumbar five-Sacral one Laminectomy for Synovial Cyst;  Surgeon: Reinaldo Meeker, MD;  Location: [facility];  Service: Neurosurgery;  Laterality: Right;  right   OOPHORECTOMY     REVERSE SHOULDER ARTHROPLASTY Right 02/22/2018   Procedure: REVERSE SHOULDER ARTHROPLASTY;  Surgeon: Christena Flake, MD;  Location: [facility] ORS;  Service: Orthopedics;  Laterality: Right;   TUBAL LIGATION     UNILATERAL SALPINGECTOMY Left 04/09/2016   Procedure: UNILATERAL SALPINGECTOMY;  Surgeon: Nadara Mustard, MD;  Location: [facility] ORS;  Service: Gynecology;  Laterality: Left;   UPPER GI ENDOSCOPY  2010   with biopsy of gastric erosion   VAGINAL HYSTERECTOMY N/A 04/09/2016   Procedure: HYSTERECTOMY VAGINAL;  Surgeon: Nadara Mustard, MD;  Location: [facility] ORS;  Service: Gynecology;  Laterality: N/A;   VAGINAL HYSTERECTOMY  2017   and bladder tack   Social History:  reports that she quit smoking about 58 years ago. Her smoking use  included cigarettes. She has never used smokeless tobacco. She reports current alcohol use. She reports that she does not use drugs.  Allergies  Allergen Reactions   Codeine Shortness Of Breath and Itching    Bronchospasms and asthma attach   Levofloxacin     Altered mental status": drunk" feeling, confused, speech problems Other reaction(s): Unknown    Oxycodone Itching and Shortness Of Breath    Would take benadryl to eliminate itching. Has had bronchospasm   Ultram [Tramadol] Itching and Other (See Comments)    Bronchospasm and asthma attack   Nitrofurantoin Diarrhea    Tried again and had no problems with it Severe and long lasting   Nsaids Other (See Comments)    Bloody stools, abdominal pain, ulcer History of gastritis   Sulfacetamide Rash    Fever, abdominal pain   Adhesive [Tape] Other (See Comments)    Thin skin. Causes tears. Paper tape okay   Azithromycin     Unsure. Patient does not remember but thinks that  it did not work for her.   Ciprofloxacin Other (See Comments)    Severe pain in neck and down back    Ketorolac Itching   Sulfa Antibiotics Itching, Rash and Other (See Comments)    fever   Tolmetin     Other reaction(s): Other (See Comments) Bloody stools, abdominal pain History of chronic gastritis   Zofran [Ondansetron Hcl] Other (See Comments)    constipation    Family History  Problem Relation Age of Onset   Pulmonary fibrosis Mother    Melanoma Father    COPD Sister    Cardiomyopathy Sister        and arrhythmia   Atrial fibrillation Sister    COPD Brother    Pulmonary fibrosis Brother    Cancer Brother    Breast cancer Cousin        paternal 1st    Prior to Admission medications   Medication Sig Start Date End Date Taking? Authorizing Provider  acetaminophen (TYLENOL) 500 MG tablet Take 1,000 mg by mouth 2 (two) times daily as needed for moderate pain.    [provider]  albuterol (VENTOLIN HFA) 108 (90 Base) MCG/ACT inhaler  Inhale 2 puffs into the lungs every 6 (six) hours as needed for wheezing or shortness of breath. 04/08/23   McDonough, Salomon Fick, PA-C  aspirin EC 81 MG tablet Take 81 mg by mouth at bedtime.     [provider]  benzonatate (TESSALON) 100 MG capsule Take 2 capsules (200 mg total) by mouth every 8 (eight) hours. 09/08/23   Becky Augusta, NP  BLACK ELDERBERRY PO Take 1 Dose by mouth 2 (two) times a day.    [provider]  Calcium Carbonate-Vitamin D 600-400 MG-UNIT tablet Take 1 tablet by mouth 2 (two) times daily.    [provider]  cholecalciferol (VITAMIN D) 1000 UNITS tablet Take 1,000 Units by mouth at bedtime.     [provider]  citalopram (CELEXA) 20 MG tablet Take 1 tablet (20 mg total) by mouth daily. 06/10/23   McDonough, Salomon Fick, PA-C  cyanocobalamin 500 MCG tablet Take 500 mcg by mouth daily.    [provider]  Dextran 70-Hypromellose (ARTIFICIAL TEARS) 0.1-0.3 % SOLN Apply 1-2 drops to eye daily.    [provider]  diazepam (VALIUM) 5 MG tablet Take 1-2 tablets (5-10 mg total) by mouth once as needed for up to 1 dose (anxiety due to medical procedure). 03/16/23   Sallyanne Kuster, NP  diclofenac Sodium (VOLTAREN) 1 % GEL Apply 4 g topically 4 (four) times daily. 04/08/23   McDonough, Salomon Fick, PA-C  diltiazem (CARDIZEM CD) 120 MG 24 hr capsule Take 180 mg by mouth at bedtime. 05/18/16   [provider]  diphenhydrAMINE (BENADRYL) 25 MG tablet Take 25-50 mg by mouth daily as needed for itching.    [provider]  docusate sodium (COLACE) 100 MG capsule Take 200 mg by mouth at bedtime as needed for moderate constipation.     [provider]  EPINEPHrine (EPIPEN 2-PAK) 0.3 mg/0.3 mL IJ SOAJ injection use as directed for severe allergy reaction 03/12/20   Yevonne Pax, MD  fenofibrate 160 MG tablet TAKE 1 TABLET DAILY FOR CHOLESTEROL 08/10/23   McDonough, Salomon Fick, PA-C  fluticasone (FLONASE) 50 MCG/ACT nasal  spray Use 2 sprays in each nostril daily, 07/12/23   McDonough, Salomon Fick, PA-C  folic acid (FOLVITE) 400 MCG tablet Take 400 mcg by mouth daily.    [provider]  guaiFENesin (MUCINEX) 600 MG 12 hr tablet Take 600 mg by mouth at bedtime as needed (congestion).    [provider]  Hypromellose (ARTIFICIAL TEARS OP) Place 1-2 drops into both eyes daily as needed (dry eyes).    [provider]  ipratropium (ATROVENT) 0.06 % nasal spray Place 2 sprays into both nostrils 4 (four) times daily. 09/08/23   Becky Augusta, NP  ipratropium-albuterol (DUONEB) 0.5-2.5 (3) MG/3ML SOLN Take 3 mLs by nebulization every 6 (six) hours as needed. 09/06/23   McDonough, Salomon Fick, PA-C  ketotifen (ZADITOR) 0.025 % ophthalmic solution Place 1 drop into both eyes 2 (two) times daily as needed (allergies).    [provider]  levocetirizine (XYZAL) 5 MG tablet Take 1 tablet (5 mg total) by mouth every evening. 07/19/23   McDonough, Salomon Fick, PA-C  levothyroxine (SYNTHROID, LEVOTHROID) 50 MCG tablet Take 50 mcg by mouth daily before breakfast. Brand Name only    [provider]  losartan (COZAAR) 25 MG tablet Take 25 mg by mouth daily. 07/22/21   [provider]  Magnesium 250 MG TABS Take 1 tablet by mouth daily.    [provider]  Melatonin 5 MG TABS Take 1 tablet by mouth at bedtime. For sleep    [provider]  metoprolol tartrate (LOPRESSOR) 25 MG tablet Take 12.5 mg by mouth daily.    [provider]  Multiple Vitamins-Minerals (MULTIVITAMIN WITH MINERALS) tablet Take 1 tablet by mouth daily.    [provider]  oseltamivir (TAMIFLU) 75 MG capsule Take 1 capsule (75 mg total) by mouth 2 (two) times daily. 09/06/23   McDonough, Salomon Fick, PA-C  OXYGEN Inhale into the lungs. 2 litre at night    [provider]  pantoprazole (PROTONIX) 40 MG tablet Take 1 tablet (40 mg total) by mouth daily. 11/30/22   McDonough, Salomon Fick, PA-C   promethazine-dextromethorphan (PROMETHAZINE-DM) 6.25-15 MG/5ML syrup Take 5 mLs by mouth 4 (four) times daily as needed. 09/08/23   Becky Augusta, NP  pyridoxine (B-6) 100 MG tablet Take 100 mg by mouth daily.    [provider]  raloxifene (EVISTA) 60 MG tablet Take 60 mg by mouth daily.    [provider]  rosuvastatin (CRESTOR) 5 MG tablet Take 1 tablet (5 mg total) by mouth daily. 09/14/22   McDonough, Salomon Fick, PA-C  sodium chloride (OCEAN) 0.65 % SOLN nasal spray Place 1 spray into both nostrils 2 (two) times daily.    [provider]  sucralfate (CARAFATE) 1 g tablet Take 3 times daily with meals. 09/09/20   McDonough, Salomon Fick, PA-C  Vitamin A 7.5 MG (25000 UT) CAPS Take 1 tablet by mouth 2 (two) times daily.    [provider]  zinc gluconate 50 MG tablet Take 50 mg by mouth daily.    [provider]    Physical Exam: Vitals:   09/10/23 1830 09/10/23 1845 09/10/23 1900 09/10/23 1915  BP: 100/79  113/76 122/64  Pulse: (!) 117 (!) 155 (!) 137 86  Resp: (!) 22 (!) 38 (!) 23 (!) 31  Temp:      TempSrc:      SpO2: 95% 93% 93% 95%  Weight:      Height:       Physical Exam Vitals and nursing note reviewed.  Constitutional:      Appearance: She is normal weight.  Cardiovascular:     Rate and Rhythm: Normal rate and regular rhythm.  Heart sounds: No murmur heard.    Comments: On repeat examination, patient noted to be tachycardic with irregularly irregular rhythm. Pulmonary:     Effort: Tachypnea, accessory muscle usage and respiratory distress present.     Breath sounds: Wheezing (Diffusely) present. No decreased breath sounds, rhonchi or rales.  Abdominal:     Palpations: Abdomen is soft.     Tenderness: There is no abdominal tenderness.  Musculoskeletal:     Right lower leg: No edema.     Left lower leg: No edema.  Skin:    General: Skin is warm and dry.  Neurological:     General: No focal deficit present.     Mental Status:  She is alert and oriented to person, place, and time.  Psychiatric:        Mood and Affect: Mood normal.        Behavior: Behavior normal.    Data Reviewed: CBC with WBC of 5.8, hemoglobin of 12.4, MCV 93.9, platelets of 223 BMP sodium 134, potassium 3.9, bicarb 26, anion gap 12, glucose 101, BUN 13, creatinine 0.67 and GFR above 60 Troponin 8 Lactic acid 0.8 Procalcitonin less than 0.10 TSH 1.47 Influenza A PCR positive.  Initial EKG personally reviewed.  Sinus rhythm with rate of 88. Repeat EKG personally reviewed.  Narrow complex irregular rhythm consistent with A-fib with RVR, rates 169.  Minimal ST depression in V4 through V6, not meeting criteria.  Although new from before.  DG Chest 2 View Result Date: 09/10/2023 CLINICAL DATA:  Shortness of breath. EXAM: CHEST - 2 VIEW COMPARISON:  Chest radiograph dated September 08, 2023. FINDINGS: The heart size and mediastinal contours are unchanged. Streaky right-greater-than-left bibasilar opacities, favored to represent atelectasis. No focal consolidation, pleural effusion, or pneumothorax. Prior right reverse total shoulder arthroplasty and vertebroplasty of the lower thoracic spine. No acute osseous abnormality. IMPRESSION: IMPRESSION Streaky bibasilar opacities, more pronounced on the right, favored to represent atelectasis. No focal consolidation. Electronically Signed   By: Hart Robinsons M.D.   On: 09/10/2023 14:36   Results are pending, will review when available.  Assessment and Plan:  * Acute hypoxic respiratory failure (HCC) Patient presenting with acute hypoxic respiratory failure in the setting of influenza A and asthma exacerbation.  Further exacerbated after patient developed atrial fibrillation with RVR.  At baseline, patient wears 2 L only at bedtime, she has never required oxygen during the daytime.  Patient also notes a strong family history of pulmonary fibrosis, wonders if this may be contributing.  - Continue  supplemental oxygen to maintain oxygen saturation above 88% - Wean as tolerated to home 2 L at bedtime - CT chest without contrast  Asthma exacerbation Patient reports a history of intermittent asthma that has been generally well controlled with as needed albuterol.  She notes previous exacerbations in the setting of viral infections, but this has been the most severe exacerbation as of yet.  - S/p Solu-Medrol 125 mg once - Continue Solu-Medrol 40 mg IV daily pending clinical improvement - DuoNebs every 6 hours - Pulmicort twice daily  Atrial fibrillation with RVR (HCC) After initial examination, patient subsequently developed chest tightness.  EKG with atrial fibrillation with RVR.  This is a new diagnosis, with history of only paroxysmal SVT.  Likely triggered by respiratory failure, influenza A, steroid use.  CHA2DS2-VASc of 5, however will wait for echocardiogram results and further discussions prior to initiating full anticoagulation.  - Telemetry monitoring - Hold home diltiazem - Continue home metoprolol -  Diltiazem infusion with goal heart rate below 100 - Echocardiogram ordered  Influenza A with pneumonia - Continue outpatient Tamiflu - Supportive management with guaifenesin and Tussionex  (HFpEF) heart failure with preserved ejection fraction (HCC) History of HFpEF with EF above 55% and G1DD.  - Hold home as needed torsemide given hypovolemia - Daily weights - Strict in and out  Coronary artery disease involving native coronary artery of native heart with angina pectoris [facility]) Patient reporting chest tightness, however likely demand in the setting of severe RVR.  Low suspicion for ACS.  Initial troponin negative, however will recheck.  Initial EKG with no ischemic changes, with nonspecific ischemic changes on repeat EKG, again likely due to demand.  - Continue home aspirin, statin - Repeat troponin pending  Benign essential HTN Blood pressure is stabilized once  ventricular rates improved.  - Restart home antihypertensives tomorrow if blood pressure remains stable  Advance Care Planning:   Code Status: Full Code   Consults: None  Family Communication: Patient's husband updated at bedside  Severity of Illness: The appropriate patient status for this patient is INPATIENT. Inpatient status is judged to be reasonable and necessary in order to provide the required intensity of service to ensure the patient's safety. The patient's presenting symptoms, physical exam findings, and initial radiographic and laboratory data in the context of their chronic comorbidities is felt to place them at high risk for further clinical deterioration. Furthermore, it is not anticipated that the patient will be medically stable for discharge from the hospital within 2 midnights of admission.   * I certify that at the point of admission it is my clinical judgment that the patient will require inpatient hospital care spanning beyond 2 midnights from the point of admission due to high intensity of service, high risk for further deterioration and high frequency of surveillance required.*  Author: Verdene Lennert, MD 09/10/2023 8:40 PM  For on call review [email or web address].

## 2023-09-10 NOTE — Assessment & Plan Note (Signed)
After initial examination, patient subsequently developed chest tightness.  EKG with atrial fibrillation with RVR.  This is a new diagnosis, with history of only paroxysmal SVT.  Likely triggered by respiratory failure, influenza A, steroid use.  CHA2DS2-VASc of 5, however will wait for echocardiogram results and further discussions prior to initiating full anticoagulation.  - Telemetry monitoring - Hold home diltiazem - Continue home metoprolol - Diltiazem infusion with goal heart rate below 100 - Echocardiogram ordered

## 2023-09-10 NOTE — ED Provider Triage Note (Signed)
Emergency Medicine Provider Triage Evaluation Note  Vanessa Evans , a 78 y.o. female  was evaluated in triage.  Pt complains of shortness of breath.  Was seen 2 days ago at [facility] but getting worse today.  Rx with tessalon, promethazine DM and inhaler.  Patient used neb tx without much improvement.    Review of Systems  Positive: + cough, + sob, + family members sick Negative: No vomiting or diarrhea.  No prior use of O2  Physical Exam  There were no vitals taken for this visit. Gen:   Awake, no distress    Short of breath Resp:  Normal effort   Wheezing noted, + cough MSK:   Moves extremities without difficulty  Other:    Medical Decision Making  Medically screening exam initiated at 1:34 PM.  Appropriate orders placed.  Vanessa Evans was informed that the remainder of the evaluation will be completed by another provider, this initial triage assessment does not replace that evaluation, and the importance of remaining in the ED until their evaluation is complete.     Tommi Rumps, PA-C 09/10/23 1345

## 2023-09-10 NOTE — Assessment & Plan Note (Signed)
Blood pressure is stabilized once ventricular rates improved.  - Restart home antihypertensives tomorrow if blood pressure remains stable

## 2023-09-10 NOTE — Assessment & Plan Note (Signed)
Patient reports a history of intermittent asthma that has been generally well controlled with as needed albuterol.  She notes previous exacerbations in the setting of viral infections, but this has been the most severe exacerbation as of yet.  - S/p Solu-Medrol 125 mg once - Continue Solu-Medrol 40 mg IV daily pending clinical improvement - DuoNebs every 6 hours - Pulmicort twice daily

## 2023-09-10 NOTE — ED Notes (Signed)
Dr. Basaraba at bedside 

## 2023-09-10 NOTE — Assessment & Plan Note (Signed)
-   Continue outpatient Tamiflu - Supportive management with guaifenesin and Tussionex

## 2023-09-10 NOTE — ED Triage Notes (Signed)
Pt sts that she does not wear o2 except at night. Pt sts that she wears 2l/min at home at night only. Pt sts that her grandson and family was dx with the Flu the other day and since then pt sts that she has gotten worse. Pt sts that she is on tamiflu at this time. Pt presented to the ED with at 86% o2 sat on 2l/min via [place]. RN reached pt o2 sat of 95% on 6l/min via [place].

## 2023-09-10 NOTE — ED Provider Notes (Signed)
[facility] Provider Note    Event Date/Time   First MD Initiated Contact with Patient 09/10/23 1638     (approximate)   History   Shortness of Breath   HPI  Vanessa Evans is a 78 y.o. female past medical history significant for heart murmur, GERD, hypertension, hyperlipidemia, migraine headaches, hypothyroidism, asthma, who presents to the emergency department with shortness of breath.  Patient states that she has not been feeling well since Monday.  Tested positive for influenza and is on day 4 of Tamiflu.  States that she is having progressively worsening shortness of breath.  Normally wears 2 L of oxygen only at nighttime but has been needing to wear it during the daytime.  Significant shortness of breath with minimal exertion.  Feels like she cannot catch her breath and is complaining of some mild chest tightness.  Does endorse nausea and vomiting on day 1 and diarrhea yesterday.  Denies any blood in her stool.  Denies any history of DVT or PE.  Does have a history of asthma and states that she been using albuterol treatments at home.  No recent hospitalizations.  No recent antibiotic use.     Physical Exam   Triage Vital Signs: ED Triage Vitals [09/10/23 1341]  Encounter Vitals Group     BP 138/74     Systolic BP Percentile      Diastolic BP Percentile      Pulse Rate 90     Resp 20     Temp 98.4 F (36.9 C)     Temp Source Oral     SpO2 95 %     Weight 148 lb 9.6 oz (67.4 kg)     Height 5\' 1"  (1.549 m)     Head Circumference      Peak Flow      Pain Score 0     Pain Loc      Pain Education      Exclude from Growth Chart     Most recent vital signs: Vitals:   09/10/23 1341  BP: 138/74  Pulse: 90  Resp: 20  Temp: 98.4 F (36.9 C)  SpO2: 95%    Physical Exam Constitutional:      Appearance: She is well-developed.  HENT:     Head: Atraumatic.  Eyes:     Conjunctiva/sclera: Conjunctivae normal.  Cardiovascular:     Rate and  Rhythm: Regular rhythm.  Pulmonary:     Effort: Tachypnea and respiratory distress present.     Breath sounds: Wheezing and rhonchi present.     Comments: 6 L nasal cannula at 95%.  Coarse breath sounds throughout all lung fields Abdominal:     General: There is no distension.  Musculoskeletal:        General: Normal range of motion.     Cervical back: Normal range of motion.     Right lower leg: No edema.     Left lower leg: No edema.  Skin:    General: Skin is warm.     Capillary Refill: Capillary refill takes 2 to 3 seconds.  Neurological:     Mental Status: She is alert. Mental status is at baseline.     IMPRESSION / MDM / ASSESSMENT AND PLAN / ED COURSE  I reviewed the triage vital signs and the nursing notes.  Differential diagnosis including asthma exacerbation, pneumonia, viral illness including COVID/influenza, anemia, dehydration, sepsis  EKG  I, Corena Herter, the attending physician, personally viewed  and interpreted this ECG.  Normal sinus rhythm.  Normal intervals.  No chamber enlargement.  T wave that is inverted to the lead III.  Some flattening in aVF.  No significant ST elevation.  Mild ST depression to the inferior leads.  No tachycardic or bradycardic dysrhythmias while on cardiac telemetry.  RADIOLOGY I independently reviewed imaging, my interpretation of imaging: Patchy infiltrates to bilateral lower lung fields.  Read as streaky bibasilar opacities more pronounced on the right to favor atelectasis.  No focal consolidation.  LABS (all labs ordered are listed, but only abnormal results are displayed) Labs interpreted as -    Labs Reviewed  RESP PANEL BY RT-PCR (RSV, FLU A&B, COVID)  RVPGX2 - Abnormal; Notable for the following components:      Result Value   Influenza A by PCR POSITIVE (*)    All other components within normal limits  BASIC METABOLIC PANEL - Abnormal; Notable for the following components:   Sodium 134 (*)    Chloride 96 (*)     Glucose, Bld 101 (*)    Calcium 8.8 (*)    All other components within normal limits  CULTURE, BLOOD (ROUTINE X 2)  CULTURE, BLOOD (ROUTINE X 2)  CBC  LACTIC ACID, PLASMA  LACTIC ACID, PLASMA  TROPONIN I (HIGH SENSITIVITY)  TROPONIN I (HIGH SENSITIVITY)     MDM    Influenza A is positive.  No significant leukocytosis.  Troponin is negative have a low suspicion for ACS or myocarditis.  Creatinine appears to be at baseline with no significant electrolyte abnormality.  No significant anemia.  Patient with acute on chronic hypoxic respiratory failure.  Patient with wheezing on exam and has a history of asthma so given DuoNeb treatments and IV Solu-Medrol.  Blood cultures and lactic acid obtained.  Patient is currently on Tamiflu and took a dose today so will not give a repeat dose of Tamiflu.  Consulted hospitalist for admission for acute on chronic hypoxic respiratory failure in the setting of influenza A.   PROCEDURES Critical Care performed: yes  .Critical Care  Performed by: Corena Herter, MD Authorized by: Corena Herter, MD   Critical care provider statement:    Critical care time (minutes):  30   Critical care time was exclusive of:  Separately billable procedures and treating other patients   Critical care was necessary to treat or prevent imminent or life-threatening deterioration of the following conditions:  Respiratory failure   Critical care was time spent personally by me on the following activities:  Development of treatment plan with patient or surrogate, discussions with consultants, evaluation of patient's response to treatment, examination of patient, ordering and review of laboratory studies, ordering and review of radiographic studies, ordering and performing treatments and interventions, pulse oximetry, re-evaluation of patient's condition and review of old charts   Care discussed with: admitting provider     Patient's presentation is most consistent with acute  presentation with potential threat to life or bodily function.   MEDICATIONS ORDERED IN ED: Medications  lactated ringers infusion (has no administration in time range)  methylPREDNISolone sodium succinate (SOLU-MEDROL) 125 mg/2 mL injection 125 mg (125 mg Intravenous Given 09/10/23 1711)  ipratropium-albuterol (DUONEB) 0.5-2.5 (3) MG/3ML nebulizer solution 6 mL (6 mLs Nebulization Given 09/10/23 1711)  sodium chloride 0.9 % bolus 1,000 mL (1,000 mLs Intravenous New Bag/Given 09/10/23 1711)    FINAL CLINICAL IMPRESSION(S) / ED DIAGNOSES   Final diagnoses:  Hypoxia  Moderate persistent asthma with exacerbation  Influenza A  Rx / DC Orders   ED Discharge Orders     None        Note:  This document was prepared using Dragon voice recognition software and may include unintentional dictation errors.   Corena Herter, MD 09/10/23 [phone]

## 2023-09-11 DIAGNOSIS — J9601 Acute respiratory failure with hypoxia: Secondary | ICD-10-CM | POA: Diagnosis not present

## 2023-09-11 LAB — CBC WITH DIFFERENTIAL/PLATELET
Abs Immature Granulocytes: 0.01 10*3/uL (ref 0.00–0.07)
Basophils Absolute: 0 10*3/uL (ref 0.0–0.1)
Basophils Relative: 0 %
Eosinophils Absolute: 0 10*3/uL (ref 0.0–0.5)
Eosinophils Relative: 0 %
HCT: 32.6 % — ABNORMAL LOW (ref 36.0–46.0)
Hemoglobin: 10.7 g/dL — ABNORMAL LOW (ref 12.0–15.0)
Immature Granulocytes: 0 %
Lymphocytes Relative: 13 %
Lymphs Abs: 0.4 10*3/uL — ABNORMAL LOW (ref 0.7–4.0)
MCH: 30.5 pg (ref 26.0–34.0)
MCHC: 32.8 g/dL (ref 30.0–36.0)
MCV: 92.9 fL (ref 80.0–100.0)
Monocytes Absolute: 0.2 10*3/uL (ref 0.1–1.0)
Monocytes Relative: 5 %
Neutro Abs: 2.3 10*3/uL (ref 1.7–7.7)
Neutrophils Relative %: 82 %
Platelets: 191 10*3/uL (ref 150–400)
RBC: 3.51 MIL/uL — ABNORMAL LOW (ref 3.87–5.11)
RDW: 12.9 % (ref 11.5–15.5)
WBC: 2.9 10*3/uL — ABNORMAL LOW (ref 4.0–10.5)
nRBC: 0 % (ref 0.0–0.2)

## 2023-09-11 LAB — BASIC METABOLIC PANEL
Anion gap: 10 (ref 5–15)
BUN: 11 mg/dL (ref 8–23)
CO2: 25 mmol/L (ref 22–32)
Calcium: 8.3 mg/dL — ABNORMAL LOW (ref 8.9–10.3)
Chloride: 103 mmol/L (ref 98–111)
Creatinine, Ser: 0.54 mg/dL (ref 0.44–1.00)
GFR, Estimated: >60 mL/min (ref >60)
Glucose, Bld: 179 mg/dL — ABNORMAL HIGH (ref 70–99)
Potassium: 3.5 mmol/L (ref 3.5–5.1)
Sodium: 138 mmol/L (ref 135–145)

## 2023-09-11 LAB — MAGNESIUM: Magnesium: 2.1 mg/dL (ref 1.7–2.4)

## 2023-09-11 LAB — HIV ANTIBODY (ROUTINE TESTING W REFLEX): HIV Screen 4th Generation wRfx: NONREACTIVE

## 2023-09-11 LAB — PHOSPHORUS: Phosphorus: 2.8 mg/dL (ref 2.5–4.6)

## 2023-09-11 MED ORDER — GUAIFENESIN-DM 100-10 MG/5ML PO SYRP: 5.0000 mL | ORAL_SOLUTION | ORAL | Status: DC | PRN

## 2023-09-11 MED ORDER — ACETAMINOPHEN 325 MG PO TABS
650.0000 mg | ORAL_TABLET | Freq: Four times a day (QID) | ORAL | Status: DC | PRN
  Administered 2023-09-11 – 2023-09-16 (×14): 650 mg via ORAL
  Filled 2023-09-11 (×14): qty 2

## 2023-09-11 MED ORDER — DILTIAZEM HCL 30 MG PO TABS
60.0000 mg | ORAL_TABLET | Freq: Three times a day (TID) | ORAL | Status: DC
  Administered 2023-09-11 – 2023-09-14 (×9): 60 mg via ORAL
  Filled 2023-09-11: qty 2
  Filled 2023-09-11 (×2): qty 1
  Filled 2023-09-11 (×3): qty 2
  Filled 2023-09-11: qty 1
  Filled 2023-09-11 (×3): qty 2

## 2023-09-11 MED ORDER — BENZONATATE 100 MG PO CAPS
100.0000 mg | ORAL_CAPSULE | Freq: Three times a day (TID) | ORAL | Status: DC | PRN
  Administered 2023-09-11 – 2023-09-12 (×2): 100 mg via ORAL
  Filled 2023-09-11 (×2): qty 1

## 2023-09-11 MED ORDER — HYDROCOD POLI-CHLORPHE POLI ER 10-8 MG/5ML PO SUER: 5.0000 mL | Freq: Two times a day (BID) | ORAL | Status: DC | PRN

## 2023-09-11 MED ORDER — APIXABAN 5 MG PO TABS
5.0000 mg | ORAL_TABLET | Freq: Two times a day (BID) | ORAL | Status: DC
Start: 2023-09-11 — End: 2023-09-17
  Administered 2023-09-11 – 2023-09-17 (×13): 5 mg via ORAL
  Filled 2023-09-11 (×13): qty 1

## 2023-09-11 MED ORDER — BENZONATATE 100 MG PO CAPS
100.0000 mg | ORAL_CAPSULE | Freq: Three times a day (TID) | ORAL | Status: DC | PRN
  Administered 2023-09-11: 100 mg via ORAL
  Filled 2023-09-11: qty 1

## 2023-09-11 NOTE — Progress Notes (Signed)
[facility] Progress Note  Patient: Vanessa Evans    OZH:[id number]  DOA: 09/10/2023     Date of Service: the patient was seen and examined on 09/11/2023  Chief Complaint  Patient presents with   Shortness of Breath   Brief hospital course:  Vanessa Evans is a 78 y.o. female with medical history significant of Mild intermittent asthma, hypothyroidism, iron deficiency anemia, nonobstructive CAD, hypertension, hyperlipidemia, paroxysmal SVT, HFpEF with G1 DD, who presents to the ED due to shortness of breath.   Vanessa Evans states that everyone in her household had recently contracted influenza.  Approximately 4 days ago, she started to feel generalized malaise with bodyaches, nausea, vomiting, fever.  She was started on Tamiflu by her PCP when she tested positive for influenza at that time.  Her initial symptoms began to improve over the next few days, however for the last 1 to 2 days, she has been experiencing gradually worsening shortness of breath and intermittently productive cough.  Due to this, she came to the ED today.   ED course: On arrival to the ED, patient was normotensive at 138/74 with heart rate of 90 respiratory rate of 20.  She was saturating at 95% on 6 L.  She was afebrile at 98.4.  Initial workup notable for normal CBC and unremarkable BMP.  Troponin, lactic acid and procalcitonin negative.  Influenza A PCR positive.  Chest x-ray was obtained with streaky bibasilar opacities.  Patient started on DuoNebs, Solu-Medrol, and IV fluids.  [facility] contacted for admission.   Assessment and Plan:   # Acute hypoxic respiratory failure due to influenza A viral infection and asthma exacerbation Shortness of breath got worse after developing A-fib with RVR Continue supplemental oxygen to maintain oxygen saturation above 88% Wean as tolerated to home 2 L at bedtime CT chest without contrast   # Acute Asthma exacerbation S/p Solu-Medrol 125 mg once Continue Solu-Medrol 40 mg  IV daily pending clinical improvement DuoNebs every 6 hours Pulmicort twice daily   # Atrial fibrillation with RVR  Most likely due to hypoxia induced by influenza A and asthma exacerbation EKG: atrial fibrillation with RVR.  This is a new diagnosis, with history of only paroxysmal SVT.  Likely triggered by respiratory failure, influenza A, steroid use.   CHA2DS2-VASc of 5, started Eliquis 5 mg p.o. twice daily TSH 1.4 within normal range S/p Cardizem IV infusion, started Cardizem 60 every 8 hourly, Toprol-XL 25 mg p.o. 3 times daily Continue to monitor on telemetry Cardiology consult appreciated TTE pending   # Influenza A with pneumonia - Continue outpatient Tamiflu - Supportive management with guaifenesin and Tussionex   # (HFpEF) heart failure with preserved ejection fraction History of HFpEF with EF above 55% and G1DD. - Hold home as needed torsemide given hypovolemia - Daily weights - Strict in and out   # Coronary artery disease involving native coronary artery of native heart with angina pectoris [facility]) Patient reporting chest tightness, however likely demand in the setting of severe RVR.  Low suspicion for ACS.  Initial troponin negative, however will recheck.  Initial EKG with no ischemic changes, with nonspecific ischemic changes on repeat EKG, again likely due to demand. - Continue home aspirin, statin - Repeat troponin 38   # Benign essential HTN Blood pressure is stabilized once ventricular rates improved. Started Cardizem, metoprolol and losartan 25 mg p.o. daily as above BP improved, continue to monitor and titrate medications accordingly    Body mass index is 28.08  kg/m.  Interventions:  Diet: Regular diet DVT Prophylaxis: Therapeutic Anticoagulation with Eliquis    Advance goals of care discussion: Full code  Family Communication: family was not present at bedside, at the time of interview.  The pt provided permission to discuss medical plan with the  family. Opportunity was given to ask question and all questions were answered satisfactorily.   Disposition:  Pt is from Home, admitted with Resp Failure, A. Fib RVR still has SOB and irregular rhythm, which precludes a safe discharge. Discharge to SNF, when stable.  Subjective: No significant overnight events, patient still has significant shortness of breath, still requiring 4 L oxygen via nasal cannula.  Patient is having some wheezing and crackles, improved after breathing treatment.  Patient denied any chest pain or palpitations during morning rounds  Physical Exam: General: NAD, lying comfortably Appear in no distress, affect appropriate Eyes: PERRLA ENT: Oral Mucosa Clear, moist  Neck: no JVD,  Cardiovascular: S1 and S2 Present, no Murmur, irregular rhythm Respiratory: Equal air entry bilaterally, bilateral crackles and mild wheezes Abdomen: Bowel Sound present, Soft and no tenderness,  Skin: no rashes Extremities: no Pedal edema, no calf tenderness Neurologic: without any new focal findings Gait not checked due to patient safety concerns  Vitals:   09/11/23 1230 09/11/23 1300 09/11/23 1330 09/11/23 1400  BP: (!) 126/58 (!) 124/58 122/62 127/61  Pulse:      Resp: (!) 27 (!) 23 (!) 25 (!) 30  Temp:      TempSrc:      SpO2:      Weight:      Height:        Intake/Output Summary (Last 24 hours) at 09/11/2023 1423 Last data filed at 09/11/2023 8657 Gross per 24 hour  Intake --  Output 1500 ml  Net -1500 ml   Filed Weights   09/10/23 1341  Weight: 67.4 kg    Data Reviewed: I have personally reviewed and interpreted daily labs, tele strips, imagings as discussed above. I reviewed all nursing notes, pharmacy notes, vitals, pertinent old records I have discussed plan of care as described above with RN and patient/family.  CBC: Recent Labs  Lab 09/10/23 1343 09/11/23 0307  WBC 5.8 2.9*  NEUTROABS  --  2.3  HGB 12.4 10.7*  HCT 38.4 32.6*  MCV 93.9 92.9  PLT 223  191   Basic Metabolic Panel: Recent Labs  Lab 09/10/23 1343 09/11/23 0306 09/11/23 0307  NA 134*  --  138  K 3.9  --  3.5  CL 96*  --  103  CO2 26  --  25  GLUCOSE 101*  --  179*  BUN 13  --  11  CREATININE 0.67  --  0.54  CALCIUM 8.8*  --  8.3*  MG  --  2.1  --   PHOS  --  2.8  --     Studies: CT CHEST WO CONTRAST Result Date: 09/10/2023 CLINICAL DATA:  Respiratory illness with nondiagnostic x-ray. Asthma, hypothyroidism, iron-deficiency anemia, hypertension, coronary artery disease. EXAM: CT CHEST WITHOUT CONTRAST TECHNIQUE: Multidetector CT imaging of the chest was performed following the standard protocol without IV contrast. RADIATION DOSE REDUCTION: This exam was performed according to the departmental dose-optimization program which includes automated exposure control, adjustment of the mA and/or kV according to patient size and/or use of iterative reconstruction technique. COMPARISON:  Chest radiograph 09/10/2023. CT chest 10/15/2020. Cardiac CT 08/20/2022 FINDINGS: Cardiovascular: Normal heart size. No pericardial effusions. Normal caliber thoracic aorta. Scattered aortic  calcification. Mediastinum/Nodes: Esophagus is decompressed. No significant lymphadenopathy. Thyroid gland is unremarkable. Lungs/Pleura: Scarring in the lung apices and bases. Atelectasis or consolidation in the lung bases. Patchy airspace disease demonstrated in the upper lungs, greater on the right. This likely represents multifocal pneumonia. No pleural effusion or pneumothorax. Central bronchiectasis. Upper Abdomen: Subcentimeter low-attenuation lesions in the liver are too small to characterize but appear to have been present previously, likely cysts or hemangiomas. Musculoskeletal: Degenerative changes in the spine. Compression of the lower thoracic vertebra post kyphoplasty. Postoperative change in the right shoulder. No acute bony abnormalities. IMPRESSION: 1. Patchy airspace disease in the upper lungs,  greater on the right, with consolidation or atelectasis in the lung bases. This is likely multifocal pneumonia. 2. Fibrosis in the lung apices and bases.  Central bronchiectasis. 3. Aortic atherosclerosis. Electronically Signed   By: Burman Nieves M.D.   On: 09/10/2023 21:34    Scheduled Meds:  aspirin EC  81 mg Oral QHS   budesonide (PULMICORT) nebulizer solution  0.5 mg Nebulization Q12H   calcium-vitamin D  1 tablet Oral BID   cholecalciferol  1,000 Units Oral QHS   citalopram  20 mg Oral Daily   cyanocobalamin  500 mcg Oral Daily   enoxaparin (LOVENOX) injection  40 mg Subcutaneous Q24H   famotidine  20 mg Oral BID   fenofibrate  160 mg Oral Daily   folic acid  500 mcg Oral Daily   guaiFENesin  600 mg Oral BID   ipratropium-albuterol  3 mL Nebulization Q6H   levothyroxine  50 mcg Oral Q0600   losartan  25 mg Oral Daily   methylPREDNISolone (SOLU-MEDROL) injection  40 mg Intravenous Daily   metoprolol succinate  25 mg Oral Daily   oseltamivir  30 mg Oral BID   raloxifene  60 mg Oral Daily   rosuvastatin  5 mg Oral Daily   sodium chloride flush  3 mL Intravenous Q12H   Continuous Infusions:  diltiazem (CARDIZEM) infusion 7.5 mg/hr (09/11/23 1023)   promethazine (PHENERGAN) injection (IM or IVPB)     PRN Meds: acetaminophen, albuterol, benzonatate, chlorpheniramine-HYDROcodone, docusate sodium, ketotifen, promethazine (PHENERGAN) injection (IM or IVPB)  Time spent: 55 minutes  Author: Gillis Santa. MD [facility] 09/11/2023 2:23 PM  To reach On-call, see care teams to locate the attending and reach out to them via [email or web address]. If 7PM-7AM, please contact night-coverage If you still have difficulty reaching the attending provider, please page the [facility] (Director on Call) for [facility] on amion for assistance.

## 2023-09-11 NOTE — Consult Note (Signed)
[facility] Cardiology  CARDIOLOGY CONSULT NOTE  Patient ID: Vanessa Evans MRN: [id number] DOB/AGE: 1946/01/04 78 y.o.  Admit date: 09/10/2023 Referring Physician Lucianne Muss Primary Physician Lynn Ito PA-C Primary Cardiologist [facility] Reason for Consultation new onset atrial fibrillation with rapid ventricular rate  HPI: 78 year old female referred for evaluation of new onset atrial fibrillation with rapid ventricular rate.  She has a history of HFpEF, paroxysmal SVT, nonobstructive coronary artery disease presents to [facility] ED with lysed malaise, myalgias, nausea and vomiting, and fever.  Prior to admission, the patient tested positive for influenza A and had begun treatment with Tamiflu however symptoms persisted and she presented to [facility] ED.  While in the emergency room, the patient developed new onset atrial fibrillation with rapid ventricular rate worsened her hypoxia.  Patient was treated with DuoNebs and IV Solu-Medrol, as well as, IV diltiazem overall clinical improvement.  Patient currently is in sinus rhythm at 75 bpm on Cardizem infusion, and home dose of metoprolol succinate.  Of note, recent coronary CT angiography 08/20/2022 revealed minimal less than 25% stenosis RCA with low coronary calcium score 5.08.  Review of systems complete and found to be negative unless listed above     Past Medical History:  Diagnosis Date   Arthritis    osteo   Asthma    needs rescue inhaler few times a year   Claustrophobia 04/21/2020   Depression    Dizziness    light headed spells but it has been a while   Dysrhythmia    tachycardia.(cardizem). brady during procedures   Fibromyalgia    GERD (gastroesophageal reflux disease)    gastritis   Headache(784.0)    Heart murmur 2019   aortic. dr. Gwen Pounds not worried about this   Hyperlipidemia    Hypertension    Hypothyroidism    Macular degeneration    Migraine    Orthopnea    Pneumonia    long time ago (greater than 5 years ago)   PONV  (postoperative nausea and vomiting)    very anxious during cataract surgery. 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 cope.  all benign.  inflammatory process going on in bladder   BREAST BIOPSY Right    benign   BREAST SURGERY Right    lumpectomy   CATARACT EXTRACTION W/PHACO Left 12/25/2014   Procedure: CATARACT EXTRACTION PHACO AND INTRAOCULAR LENS PLACEMENT (IOC);  Surgeon: Galen Manila, MD;  Location: [facility] ORS;  Service: Ophthalmology;  Laterality: Left;  [place] 00:32 AP% 20.0 CDE 6.49   COLONOSCOPY W/ BIOPSIES     COLONOSCOPY W/ POLYPECTOMY  2002, 2004, 2005   adenomatous polyps removed   COLONOSCOPY WITH PROPOFOL N/A 08/23/2017   Procedure: COLONOSCOPY WITH PROPOFOL;  Surgeon: Scot Jun, MD;  Location: [facility] ENDOSCOPY;  Service: Endoscopy;  Laterality: N/A;   COLONOSCOPY WITH PROPOFOL N/A 11/08/2017   Procedure: COLONOSCOPY WITH PROPOFOL;  Surgeon: Scot Jun, MD;  Location: [facility] ENDOSCOPY;  Service: Endoscopy;  Laterality: N/A;   COLONOSCOPY WITH PROPOFOL N/A 06/30/2021   Procedure: COLONOSCOPY WITH PROPOFOL;  Surgeon: Regis Bill, MD;  Location: [facility] ENDOSCOPY;  Service: Endoscopy;  Laterality: N/A;   CYSTOCELE REPAIR  N/A 04/09/2016   Procedure: ANTERIOR REPAIR (CYSTOCELE);  Surgeon: Nadara Mustard, MD;  Location: [facility] ORS;  Service: Gynecology;  Laterality: N/A;   DIAGNOSTIC LAPAROSCOPY  2008   removed both ovaries with cysts, tubes and fibroids   DILATION AND CURETTAGE OF UTERUS  1973   ESOPHAGOGASTRODUODENOSCOPY (EGD) WITH PROPOFOL N/A 08/23/2017   Procedure: ESOPHAGOGASTRODUODENOSCOPY (EGD) WITH PROPOFOL;  Surgeon: Scot Jun, MD;  Location: [facility] ENDOSCOPY;  Service: Endoscopy;  Laterality:  N/A;   ESOPHAGOGASTRODUODENOSCOPY (EGD) WITH PROPOFOL N/A 06/30/2021   Procedure: ESOPHAGOGASTRODUODENOSCOPY (EGD) WITH PROPOFOL;  Surgeon: Regis Bill, MD;  Location: [facility] ENDOSCOPY;  Service: Endoscopy;  Laterality: N/A;   EYE SURGERY Bilateral 2016   HERNIA REPAIR Right 2008   Inguinal hernia Repair, ventral hernia repair   IR KYPHO THORACIC WITH BONE BIOPSY  05/12/2022   IR RADIOLOGIST EVAL & MGMT  05/19/2022   IR RADIOLOGIST EVAL & MGMT  05/26/2022   JOINT REPLACEMENT Right 2008   knee   KNEE ARTHROSCOPY Right    LUMBAR LAMINECTOMY/DECOMPRESSION MICRODISCECTOMY Right 03/21/2013   Procedure: Right Lumbar five-Sacral one Laminectomy for Synovial Cyst;  Surgeon: Reinaldo Meeker, MD;  Location: [facility]URO ORS;  Service: Neurosurgery;  Laterality: Right;  right   OOPHORECTOMY     REVERSE SHOULDER ARTHROPLASTY Right 02/22/2018   Procedure: REVERSE SHOULDER ARTHROPLASTY;  Surgeon: Christena Flake, MD;  Location: [facility] ORS;  Service: Orthopedics;  Laterality: Right;   TUBAL LIGATION     UNILATERAL SALPINGECTOMY Left 04/09/2016   Procedure: UNILATERAL SALPINGECTOMY;  Surgeon: Nadara Mustard, MD;  Location: [facility] ORS;  Service: Gynecology;  Laterality: Left;   UPPER GI ENDOSCOPY  2010   with biopsy of gastric erosion   VAGINAL HYSTERECTOMY N/A 04/09/2016   Procedure: HYSTERECTOMY VAGINAL;  Surgeon: Nadara Mustard, MD;  Location: [facility] ORS;  Service: Gynecology;  Laterality: N/A;   VAGINAL HYSTERECTOMY  2017   and bladder tack    (Not in a hospital admission)  Social History   Socioeconomic History   Marital status: Married    Spouse name: Not on file   Number of children: Not on file   Years of education: Not on file   Highest education level: Not on file  Occupational History   Not on file  Tobacco Use   Smoking status: Former    Current packs/day: 0.00    Types: Cigarettes    Quit date: 40    Years since quitting: 58.1   Smokeless tobacco: Never  Vaping Use   Vaping  status: Never Used  Substance and Sexual Activity   Alcohol use: Yes    Comment: occasionally   Drug use: No   Sexual activity: Not Currently  Other Topics Concern   Not on file  Social History Narrative   Not on file   Social Drivers of Health   Financial Resource Strain: Not on file  Food Insecurity: No Food Insecurity (04/21/2023)   Hunger Vital Sign    Worried About Running Out of Food in the Last Year: Never true    Ran Out of Food in the Last Year: Never true  Transportation Needs: No Transportation Needs (04/21/2023)   PRAPARE - Administrator, Civil Service (Medical): No    Lack of Transportation (Non-Medical): No  Physical Activity: Not on file  Stress: Not on file  Social Connections: Not on file  Intimate Partner Violence: Not At Risk (04/21/2023)   Humiliation, Afraid, Rape, and Kick questionnaire  Fear of Current or Ex-Partner: No    Emotionally Abused: No    Physically Abused: No    Sexually Abused: No    Family History  Problem Relation Age of Onset   Pulmonary fibrosis Mother    Melanoma Father    COPD Sister    Cardiomyopathy Sister        and arrhythmia   Atrial fibrillation Sister    COPD Brother    Pulmonary fibrosis Brother    Cancer Brother    Breast cancer Cousin        paternal 1st      Review of systems complete and found to be negative unless listed above      PHYSICAL EXAM  General: Well developed, well nourished, in no acute distress HEENT:  Normocephalic and atramatic Neck:  No JVD.  Lungs: Clear bilaterally to auscultation and percussion. Heart: HRRR . Normal S1 and S2 without gallops or murmurs.  Abdomen: Bowel sounds are positive, abdomen soft and non-tender  Msk:  Back normal, normal gait. Normal strength and tone for age. Extremities: No clubbing, cyanosis or edema.   Neuro: Alert and oriented X 3. Psych:  Good affect, responds appropriately  Labs:   Lab Results  Component Value Date   WBC 2.9 (L)  09/11/2023   HGB 10.7 (L) 09/11/2023   HCT 32.6 (L) 09/11/2023   MCV 92.9 09/11/2023   PLT 191 09/11/2023    Recent Labs  Lab 09/11/23 0307  NA 138  K 3.5  CL 103  CO2 25  BUN 11  CREATININE 0.54  CALCIUM 8.3*  GLUCOSE 179*   Lab Results  Component Value Date   TROPONINI <0.03 06/07/2017    Lab Results  Component Value Date   CHOL 138 08/25/2023   CHOL 151 10/06/2022   CHOL 166 09/30/2021   Lab Results  Component Value Date   HDL 51 08/25/2023   HDL 55 10/06/2022   HDL 48 09/30/2021   Lab Results  Component Value Date   LDLCALC 66 08/25/2023   LDLCALC 76 10/06/2022   LDLCALC 88 09/30/2021   Lab Results  Component Value Date   TRIG 116 08/25/2023   TRIG 113 10/06/2022   TRIG 174 (H) 09/30/2021   No results found for: "CHOLHDL" No results found for: "LDLDIRECT"    Radiology: CT CHEST WO CONTRAST Result Date: 09/10/2023 CLINICAL DATA:  Respiratory illness with nondiagnostic x-ray. Asthma, hypothyroidism, iron-deficiency anemia, hypertension, coronary artery disease. EXAM: CT CHEST WITHOUT CONTRAST TECHNIQUE: Multidetector CT imaging of the chest was performed following the standard protocol without IV contrast. RADIATION DOSE REDUCTION: This exam was performed according to the departmental dose-optimization program which includes automated exposure control, adjustment of the mA and/or kV according to patient size and/or use of iterative reconstruction technique. COMPARISON:  Chest radiograph 09/10/2023. CT chest 10/15/2020. Cardiac CT 08/20/2022 FINDINGS: Cardiovascular: Normal heart size. No pericardial effusions. Normal caliber thoracic aorta. Scattered aortic calcification. Mediastinum/Nodes: Esophagus is decompressed. No significant lymphadenopathy. Thyroid gland is unremarkable. Lungs/Pleura: Scarring in the lung apices and bases. Atelectasis or consolidation in the lung bases. Patchy airspace disease demonstrated in the upper lungs, greater on the right. This  likely represents multifocal pneumonia. No pleural effusion or pneumothorax. Central bronchiectasis. Upper Abdomen: Subcentimeter low-attenuation lesions in the liver are too small to characterize but appear to have been present previously, likely cysts or hemangiomas. Musculoskeletal: Degenerative changes in the spine. Compression of the lower thoracic vertebra post kyphoplasty. Postoperative change in the right shoulder. No  acute bony abnormalities. IMPRESSION: 1. Patchy airspace disease in the upper lungs, greater on the right, with consolidation or atelectasis in the lung bases. This is likely multifocal pneumonia. 2. Fibrosis in the lung apices and bases.  Central bronchiectasis. 3. Aortic atherosclerosis. Electronically Signed   By: Burman Nieves M.D.   On: 09/10/2023 21:34   DG Chest 2 View Result Date: 09/10/2023 CLINICAL DATA:  Shortness of breath. EXAM: CHEST - 2 VIEW COMPARISON:  Chest radiograph dated September 08, 2023. FINDINGS: The heart size and mediastinal contours are unchanged. Streaky right-greater-than-left bibasilar opacities, favored to represent atelectasis. No focal consolidation, pleural effusion, or pneumothorax. Prior right reverse total shoulder arthroplasty and vertebroplasty of the lower thoracic spine. No acute osseous abnormality. IMPRESSION: IMPRESSION Streaky bibasilar opacities, more pronounced on the right, favored to represent atelectasis. No focal consolidation. Electronically Signed   By: Hart Robinsons M.D.   On: 09/10/2023 14:36   DG Chest 2 View Result Date: 09/08/2023 CLINICAL DATA:  Productive cough EXAM: CHEST - 2 VIEW COMPARISON:  None Available. FINDINGS: Normal mediastinum and cardiac silhouette. Normal pulmonary vasculature. No evidence of effusion, infiltrate, or pneumothorax. No acute bony abnormality. Prior lower thoracic vertebroplasty.  Post shoulder arthroplasty. IMPRESSION: No acute cardiopulmonary process. Electronically Signed   By: Genevive Bi  M.D.   On: 09/08/2023 17:09    EKG: Atrial fibrillation with rapid ventricular rate at 169 bpm  ASSESSMENT AND PLAN:   1.  New onset atrial fibrillation with rapid ventricular rate, in the setting of respiratory failure secondary to asthma exacerbation and influenza A, converted to sinus rhythm on diltiazem drip.  The patient has CHA2DS2-VASc score of 5, and would benefit being on chronic anticoagulation, at least temporarily during asthma exacerbation and influenza A infection. 2.  Chronic HFpEF, LVEF > 55% 09/17/2021, on good medical management (metoprolol to tartrate, losartan), currently appears euvolemic 3.  Respiratory failure, multifactorial, secondary to asthma exacerbation, influenza A infection, and chronic HFpEF, oxygen saturation 95%, currently on 4 L by nasal cannula, on multiple nebulizer therapy, and IV steroids 4.  CAD, minimal less than 25% stenosis RCA by coronary CT angiography 08/20/2022  Recommendations  1.  Agree with current therapy 2.  Start Eliquis 5 mg twice daily.  Even though the patient experienced atrial fibrillation during asthma exacerbation and influenza A infection, I still think the patient would benefit from being on chronic anticoagulation with a CHA2DS2-VASc score of 5.  Eliquis could be potentially discontinued after a lengthy quiescence without atrial fibrillation.  Discussed in the detail of the risks, benefits alternatives of adding Eliquis, and the patient is in agreement. 3.  Transition diltiazem IV to diltiazem p.o. 60 mg every 8 hours, then changed to Cardizem CD 180 mg prior to discharge 4.  Review 2D echocardiogram 5.  Further recommendations pending 2D echocardiogram results   Signed: Marcina Millard MD,PhD, [facility] 09/11/2023, 2:15 PM

## 2023-09-11 NOTE — Evaluation (Signed)
Physical Therapy Evaluation Patient Details Name: Vanessa Evans MRN: [id number] DOB: [birth year] Today's Date: 09/11/2023  History of Present Illness  Pt admitted to [facility] on 09/10/23 for c/o worsening SOB with hypoxia that improved with supplemental O2. Dx with acute hypoxic respiratory failure secondary to the Flu. Significant PMH includes:  heart murmur, GERD, hypertension, hyperlipidemia, migraine headaches, hypothyroidism, asthma.   Clinical Impression  Pt is a 78 year old F admitted to hospital on 09/10/23 for acute hypoxic respiratory failure. At baseline, pt is mod I for ADL's, splitting IADL's, medication management, driving, household ambulation without AD (uses walls/furniture for steadying PRN), and limited community ambulation with SPC.   Pt presents with functional weakness, decreased activity tolerance, increased O2 dependence from baseline (4L), decreased gross balance, impaired skin integrity, increased pain levels, and gait deficits, resulting in impaired functional mobility from baseline. Due to deficits, pt required CGA-min assist for bed mobility, min assist for transfers with [facility] in R hand, and min assist to ambulate 73ft with SPC and L HHA. Further mobility limited today secondary to c/o DOE, decreased balance, and tachypnea in high 20's with minimal mobility.   Deficits limit the pt's ability to safely and independently perform ADL's, transfer, and ambulate. Pt will benefit from acute skilled PT services to address deficits for return to baseline function. Recommending post acute therapy services to address deficits for return to baseline function.         If plan is discharge home, recommend the following: A little help with walking and/or transfers;A little help with bathing/dressing/bathroom;Assistance with cooking/housework;Assist for transportation;Help with stairs or ramp for entrance   Can travel by private vehicle   Yes    Equipment Recommendations None  recommended by PT     Functional Status Assessment Patient has had a recent decline in their functional status and demonstrates the ability to make significant improvements in function in a reasonable and predictable amount of time.     Precautions / Restrictions Precautions Precautions: Fall Restrictions Weight Bearing Restrictions Per Provider Order: No Other Position/Activity Restrictions: SpO2 >/= 92%      Mobility  Bed Mobility Overal bed mobility: Needs Assistance Bed Mobility: Supine to Sit, Sit to Supine     Supine to sit: Contact guard Sit to supine: Min assist   General bed mobility comments: CGA to sit EOB, HOB elevated, use of BUE for support. Min assist for BLE facilitation with sit>supine. Increased time/effort and labored breathing.    Transfers Overall transfer level: Needs assistance Equipment used: Straight cane Transfers: Sit to/from Stand Sit to Stand: Min assist           General transfer comment: Min assist for power to stand from EOB with SPC in R hand. Increased reliance on momentum to facilitate transfer. Poor eccentric control with lowering. Verbal cues for safety and sequencing.    Ambulation/Gait Ambulation/Gait assistance: Min assist Gait Distance (Feet): 5 Feet Assistive device: Straight cane, 1 person hand held assist         General Gait Details: Min assist for balance to take multiple small steps at bedside with [facility] in R hand. Unable to safely facilitate stepping without additional HHA to LUE for steadying. Demonstrates decreased foot clearance/step length bilaterally, slowed cadence, and increased lateral trunk sway with weight shift. Further mobility limited secondary to fatigue and elevated RR in high 20's with minimal mobility.     Balance Overall balance assessment: Needs assistance Sitting-balance support: Bilateral upper extremity supported, Feet unsupported Sitting balance-Leahy Scale:  Fair     Standing balance support:  During functional activity, Reliant on assistive device for balance, Bilateral upper extremity supported Standing balance-Leahy Scale: Poor Standing balance comment: min assist for standing balance with BUE support                             Pertinent Vitals/Pain Pain Assessment Pain Assessment: 0-10 Pain Score: 7  Pain Location: back (7/10) and HA (4/10) Pain Intervention(s): Monitored during session, Repositioned    Home Living Family/patient expects to be discharged to:: Private residence Living Arrangements: Spouse/significant other (grandson rents room) Available Help at Discharge: Available 24 hours/day (spouse available 24/7 care (limited physical assist); grandson PRN care) Type of Home: House Home Access: Stairs to enter Entrance Stairs-Rails: Right Entrance Stairs-Number of Steps: 2   Home Layout: One level Home Equipment: Grab bars - toilet;Grab bars - tub/shower;Shower seat - built in;BSC/3in1;Rollator (4 wheels);Cane - single point;Adaptive equipment Additional Comments: wears 2L O2 at night    Prior Function Prior Level of Function : Driving;Independent/Modified Independent             Mobility Comments: Mod I for household ambulation with UE support on furniture for steadying; intermittent use of SPC in morning due to back pain. Limited community ambulation with SPC. Endorses "stumbling" but catching self before falling. ADLs Comments: Mod I for ADL's, splits IADL's, medication management.     Extremity/Trunk Assessment   Upper Extremity Assessment Upper Extremity Assessment: Overall WFL for tasks assessed (not formally assessed due to SOB with conversation; observed to be at least 3+/5, full AROM, sensation intact)    Lower Extremity Assessment Lower Extremity Assessment: Overall WFL for tasks assessed (not formally assessed due to SOB with conversation; observed to be at least 3+/5, full AROM, sensation intact, coordination intact)     Cervical / Trunk Assessment Cervical / Trunk Assessment: Kyphotic  Communication   Communication Communication: No apparent difficulties Cueing Techniques: Verbal cues;Tactile cues  Cognition Arousal: Alert Behavior During Therapy: WFL for tasks assessed/performed Overall Cognitive Status: Within Functional Limits for tasks assessed                                          General Comments General comments (skin integrity, edema, etc.): skin discoloration to BLE (Chronic), mild edema of LLE at calve which she notes is baseline. On 4L O2 via [place] with SpO2 >/= 92%. Elevated RR in high 20's with minimal mobility, increased time/effort for mobility with rest breaks.    Exercises Other Exercises Other Exercises: Pt and spouse educated re: PT role/POC, DC recommendations, safety with functional mobility, benefits of RW, pursed lip breathing, talk with nursing about medication for calf cramps. They verbalized understanding.   Assessment/Plan    PT Assessment Patient needs continued PT services  PT Problem List Decreased strength;Decreased activity tolerance;Decreased balance;Decreased mobility;Cardiopulmonary status limiting activity;Pain       PT Treatment Interventions DME instruction;Gait training;Stair training;Functional mobility training;Therapeutic activities;Therapeutic exercise;Balance training;Neuromuscular re-education    PT Goals (Current goals can be found in the Care Plan section)  Acute Rehab PT Goals Patient Stated Goal: "go home" PT Goal Formulation: With patient/family Time For Goal Achievement: 09/25/23 Potential to Achieve Goals: Good    Frequency Min 1X/week        AM-PAC PT "6 Clicks" Mobility  Outcome Measure Help needed turning from  your back to your side while in a flat bed without using bedrails?: A Little Help needed moving from lying on your back to sitting on the side of a flat bed without using bedrails?: A Little Help needed  moving to and from a bed to a chair (including a wheelchair)?: A Little Help needed standing up from a chair using your arms (e.g., wheelchair or bedside chair)?: A Little Help needed to walk in hospital room?: A Little Help needed climbing 3-5 steps with a railing? : A Lot 6 Click Score: 17    End of Session Equipment Utilized During Treatment: Gait belt;Oxygen Activity Tolerance: Patient limited by fatigue Patient left: in bed;with call bell/phone within reach;with family/visitor present Nurse Communication: Mobility status PT Visit Diagnosis: Unsteadiness on feet (R26.81);Muscle weakness (generalized) (M62.81);Pain Pain - Right/Left:  (back)    Time: 1610-9604 PT Time Calculation (min) (ACUTE ONLY): 20 min   Charges:   PT Evaluation $PT Eval Low Complexity: 1 Low PT Treatments $Therapeutic Activity: 8-22 mins PT General Charges $$ ACUTE PT VISIT: 1 Visit        Vira Blanco, PT, DPT 9:48 AM,09/11/23 Physical Therapist - [facility]

## 2023-09-11 NOTE — Evaluation (Signed)
Occupational Therapy Evaluation Patient Details Name: Vanessa Evans MRN: [id number] DOB: 10/27/1945 Today's Date: 09/11/2023   History of Present Illness Pt admitted to [facility] on 09/10/23 for c/o worsening SOB with hypoxia that improved with supplemental O2. Dx with acute hypoxic respiratory failure secondary to the Flu. Significant PMH includes:  heart murmur, GERD, hypertension, hyperlipidemia, migraine headaches, hypothyroidism, asthma.   Clinical Impression   PTA, pt mod independent with ADLs and IADLs,  including driving. Occasional use of SPC for mobility in home due to chronic LBP, and limited community ambulation. Pt with decreased activity tolerance secondary to DOE, and fatigues quickly. Increased O2 dependence from baseline 2L as pt is currently on 4L via [place]. Pt performing grooming and oral care tasks bed level with setup, minA for functional transfers / lateral sidesteps. Anticipate pt requiring minA for LB ADLs at this time due to fatigue and limited tolerance to activity. Pt would benefit from skilled OT services to address noted impairments and functional limitations (see below for any additional details) in order to maximize safety and independence while minimizing falls risk and caregiver burden. Patient will benefit from continued inpatient follow up therapy, <3 hours/day       If plan is discharge home, recommend the following: A little help with walking and/or transfers;A little help with bathing/dressing/bathroom;Assist for transportation    Functional Status Assessment  Patient has had a recent decline in their functional status and demonstrates the ability to make significant improvements in function in a reasonable and predictable amount of time.  Equipment Recommendations  None recommended by OT (defer)       Precautions / Restrictions Precautions Precautions: Fall Restrictions Weight Bearing Restrictions Per Provider Order: No Other Position/Activity Restrictions:  SpO2 >/= 92%      Mobility Bed Mobility Overal bed mobility: Needs Assistance Bed Mobility: Supine to Sit, Sit to Supine     Supine to sit: Min assist Sit to supine: Min assist   General bed mobility comments: increased time and effort, OT providing cues for  breathing techniques and transitoinal recovery breaks for mobility    Transfers Overall transfer level: Needs assistance Equipment used: None Transfers: Sit to/from Stand Sit to Stand: Min assist                  Balance Overall balance assessment: Needs assistance Sitting-balance support: Bilateral upper extremity supported Sitting balance-Leahy Scale: Fair     Standing balance support: During functional activity Standing balance-Leahy Scale: Poor Standing balance comment: stands and unable to tolerate progression due to environment/DOE (IV hooked up on bed). lateral steps ~4x                           ADL either performed or assessed with clinical judgement   ADL Overall ADL's : Needs assistance/impaired     Grooming: Wash/dry hands;Wash/dry face;Oral care;Bed level Grooming Details (indicate cue type and reason): performing bed level grooming tasks secondary to DOE per pt preference at this time.                 Toilet Transfer: Minimal Surveyor, quantity Details (indicate cue type and reason): clinical judgement, anticipate pt needing recovery breaks due to respiratory status         Functional mobility during ADLs: Contact guard assist General ADL Comments: Pt limited by DOE, currently on 4L O2 with SpO2 95%> throughout tx.     Vision Baseline Vision/History: 1 Wears glasses Ability to  See in Adequate Light: 0 Adequate Patient Visual Report: No change from baseline (mac deg R eye by pt report) Vision Assessment?: Wears glasses for reading;Wears glasses for driving            Pertinent Vitals/Pain Pain Assessment Pain Assessment: 0-10 Pain Score: 7  Pain  Location: back (7/10) and HA (2/10), chronic back pain Pain Descriptors / Indicators: Dull, Discomfort, Burning, Squeezing Pain Intervention(s): Limited activity within patient's tolerance, Monitored during session     Extremity/Trunk Assessment Upper Extremity Assessment Upper Extremity Assessment: Overall WFL for tasks assessed (not formally assessed due to SOB with conversation; observed to be at least 3+/5, full AROM, sensation intact)   Lower Extremity Assessment Lower Extremity Assessment: Overall WFL for tasks assessed (not formally assessed due to SOB with conversation; observed to be at least 3+/5, full AROM, sensation intact, coordination intact)   Cervical / Trunk Assessment Cervical / Trunk Assessment: Kyphotic   Communication Communication Communication: No apparent difficulties Cueing Techniques: Verbal cues;Tactile cues   Cognition Arousal: Alert Behavior During Therapy: WFL for tasks assessed/performed Overall Cognitive Status: Within Functional Limits for tasks assessed                                       General Comments  skin discoloration to BLE (Chronic), mild edema of LLE at calve which she notes is baseline. On 4L O2 via [place] with SpO2 >/= 92%. Elevated RR in high 20's with minimal mobility, increased time/effort for mobility with rest breaks. RN present for meds during OT session.             Home Living Family/patient expects to be discharged to:: Private residence Living Arrangements: Spouse/significant other (grandson rents room) Available Help at Discharge: Available 24 hours/day (spouse available 24/7 care (limited physical assist); grandson PRN care) Type of Home: House Home Access: Stairs to enter Secretary/administrator of Steps: 2 Entrance Stairs-Rails: Right Home Layout: One level     Bathroom Shower/Tub: Walk-in shower (threshold step)   Bathroom Toilet: Handicapped height Bathroom Accessibility: Yes   Home Equipment:  Grab bars - toilet;Grab bars - tub/shower;Shower seat - built in;BSC/3in1;Rollator (4 wheels);Cane - single point;Adaptive equipment Adaptive Equipment: Reacher;Long-handled shoe horn Additional Comments: wears 2L O2 at night      Prior Functioning/Environment Prior Level of Function : Driving;Independent/Modified Independent             Mobility Comments: Mod I for household ambulation with UE support on furniture for steadying; intermittent use of SPC in morning due to back pain. Limited community ambulation with SPC. Endorses "stumbling" but catching self before falling. ADLs Comments: Mod I for ADL's, splits IADL's, medication management.        OT Problem List: Cardiopulmonary status limiting activity;Decreased strength;Decreased activity tolerance;Impaired balance (sitting and/or standing)      OT Treatment/Interventions: Self-care/ADL training;Neuromuscular education;Energy conservation;DME and/or AE instruction;Therapeutic activities;Patient/family education;Balance training    OT Goals(Current goals can be found in the care plan section) Acute Rehab OT Goals OT Goal Formulation: With patient/family Time For Goal Achievement: 09/25/23 Potential to Achieve Goals: Good  OT Frequency: Min 1X/week       AM-PAC OT "6 Clicks" Daily Activity     Outcome Measure Help from another person eating meals?: None Help from another person taking care of personal grooming?: A Little Help from another person toileting, which includes using toliet, bedpan, or urinal?: A Little Help  from another person bathing (including washing, rinsing, drying)?: A Little Help from another person to put on and taking off regular upper body clothing?: A Little Help from another person to put on and taking off regular lower body clothing?: A Little 6 Click Score: 19   End of Session Equipment Utilized During Treatment: Oxygen Nurse Communication: Mobility status  Activity Tolerance: Patient tolerated  treatment well Patient left: in bed;with call bell/phone within reach;with nursing/sitter in room;with family/visitor present  OT Visit Diagnosis: Unsteadiness on feet (R26.81);Other abnormalities of gait and mobility (R26.89);Muscle weakness (generalized) (M62.81)                Time: 4782-9562 OT Time Calculation (min): 27 min Charges:  OT General Charges $OT Visit: 1 Visit OT Evaluation $OT Eval Low Complexity: 1 Low OT Treatments $Self Care/Home Management : 8-22 mins  Braedan Meuth L. Queenie Aufiero, OTR/L  09/11/23, 10:29 AM

## 2023-09-12 ENCOUNTER — Inpatient Hospital Stay
Admit: 2023-09-12 | Discharge: 2023-09-12 | Disposition: A | Discharge status: Home / Self Care | Payer: Medicare Other | Attending: Internal Medicine | Admitting: Internal Medicine

## 2023-09-12 DIAGNOSIS — J9601 Acute respiratory failure with hypoxia: Secondary | ICD-10-CM | POA: Diagnosis not present

## 2023-09-12 LAB — BASIC METABOLIC PANEL
Anion gap: 12 (ref 5–15)
BUN: 12 mg/dL (ref 8–23)
CO2: 23 mmol/L (ref 22–32)
Calcium: 8.6 mg/dL — ABNORMAL LOW (ref 8.9–10.3)
Chloride: 102 mmol/L (ref 98–111)
Creatinine, Ser: 0.59 mg/dL (ref 0.44–1.00)
GFR, Estimated: >60 mL/min (ref >60)
Glucose, Bld: 138 mg/dL — ABNORMAL HIGH (ref 70–99)
Potassium: 3.5 mmol/L (ref 3.5–5.1)
Sodium: 137 mmol/L (ref 135–145)

## 2023-09-12 LAB — CBC
HCT: 32.7 % — ABNORMAL LOW (ref 36.0–46.0)
Hemoglobin: 10.5 g/dL — ABNORMAL LOW (ref 12.0–15.0)
MCH: 30.2 pg (ref 26.0–34.0)
MCHC: 32.1 g/dL (ref 30.0–36.0)
MCV: 94 fL (ref 80.0–100.0)
Platelets: 197 10*3/uL (ref 150–400)
RBC: 3.48 MIL/uL — ABNORMAL LOW (ref 3.87–5.11)
RDW: 12.9 % (ref 11.5–15.5)
WBC: 7.1 10*3/uL (ref 4.0–10.5)
nRBC: 0 % (ref 0.0–0.2)

## 2023-09-12 LAB — ECHOCARDIOGRAM COMPLETE
AR max vel: 2.3 cm2
AV Peak grad: 7.5 mm[Hg]
Ao pk vel: 1.37 m/s
Area-P 1/2: 3.53 cm2
Height: 61 in
S' Lateral: 2.6 cm
Weight: 2377.62 [oz_av]

## 2023-09-12 LAB — PHOSPHORUS: Phosphorus: 2.7 mg/dL (ref 2.5–4.6)

## 2023-09-12 LAB — MAGNESIUM: Magnesium: 2.2 mg/dL (ref 1.7–2.4)

## 2023-09-12 MED ORDER — LORATADINE 10 MG PO TABS
10.0000 mg | ORAL_TABLET | Freq: Every day | ORAL | Status: DC
Start: 2023-09-12 — End: 2023-09-17
  Administered 2023-09-12 – 2023-09-17 (×6): 10 mg via ORAL
  Filled 2023-09-12 (×6): qty 1

## 2023-09-12 MED ORDER — BENZONATATE 100 MG PO CAPS
100.0000 mg | ORAL_CAPSULE | Freq: Three times a day (TID) | ORAL | Status: DC | PRN
  Administered 2023-09-12: 100 mg via ORAL
  Administered 2023-09-13 (×2): 200 mg via ORAL
  Administered 2023-09-14: 100 mg via ORAL
  Administered 2023-09-14 – 2023-09-16 (×3): 200 mg via ORAL
  Filled 2023-09-12 (×3): qty 2
  Filled 2023-09-12: qty 1
  Filled 2023-09-12: qty 2
  Filled 2023-09-12: qty 1
  Filled 2023-09-12: qty 2

## 2023-09-12 MED ORDER — ARFORMOTEROL TARTRATE 15 MCG/2ML IN NEBU
15.0000 ug | INHALATION_SOLUTION | Freq: Two times a day (BID) | RESPIRATORY_TRACT | Status: DC
  Administered 2023-09-12 – 2023-09-17 (×8): 15 ug via RESPIRATORY_TRACT
  Filled 2023-09-12 (×13): qty 2

## 2023-09-12 MED ORDER — DM-GUAIFENESIN ER 30-600 MG PO TB12: 1.0000 | ORAL_TABLET | Freq: Two times a day (BID) | ORAL | Status: DC | PRN

## 2023-09-12 MED ORDER — DM-GUAIFENESIN ER 30-600 MG PO TB12
1.0000 | ORAL_TABLET | Freq: Once | ORAL | Status: AC
  Administered 2023-09-12: 1 via ORAL
  Filled 2023-09-12: qty 1

## 2023-09-12 MED ORDER — BISACODYL 5 MG PO TBEC
10.0000 mg | DELAYED_RELEASE_TABLET | Freq: Every day | ORAL | Status: DC
Start: 2023-09-12 — End: 2023-09-13
  Administered 2023-09-12: 10 mg via ORAL
  Filled 2023-09-12: qty 2

## 2023-09-12 MED ORDER — BISACODYL 5 MG PO TBEC
10.0000 mg | DELAYED_RELEASE_TABLET | Freq: Once | ORAL | Status: AC
  Administered 2023-09-12: 10 mg via ORAL
  Filled 2023-09-12: qty 2

## 2023-09-12 MED ORDER — BISACODYL 10 MG RE SUPP: 10.0000 mg | Freq: Every day | RECTAL | Status: DC | PRN

## 2023-09-12 MED ORDER — PANTOPRAZOLE SODIUM 40 MG PO TBEC
40.0000 mg | DELAYED_RELEASE_TABLET | Freq: Every day | ORAL | Status: DC
  Administered 2023-09-12 – 2023-09-17 (×6): 40 mg via ORAL
  Filled 2023-09-12 (×6): qty 1

## 2023-09-12 MED ORDER — POLYETHYLENE GLYCOL 3350 17 G PO PACK
17.0000 g | PACK | Freq: Two times a day (BID) | ORAL | Status: DC
  Administered 2023-09-12 – 2023-09-13 (×2): 17 g via ORAL
  Filled 2023-09-12 (×2): qty 1

## 2023-09-12 NOTE — Progress Notes (Signed)
Patient ID: Vanessa Evans, female   DOB: 1946/03/08, 78 y.o.   MRN: [id number] [facility] Card[facility]    SUBJECTIVE: Patient resting comfortably in bed but shortness of breath presented with flu infection dyspnea respiratory failure hypoxemia improving slowly patient developed atrial fibrillation now on Eliquis and rate control denies any chest pain   Vitals:   09/12/23 0745 09/12/23 0800 09/12/23 0827 09/12/23 1039  BP:  (!) 117/58 (!) 117/58 135/76  Pulse: 69 89 89 78  Resp:  17  15  Temp:  98 F (36.7 C)  97.8 F (36.6 C)  TempSrc:      SpO2: 94% 95%  91%  Weight:      Height:         Intake/Output Summary (Last 24 hours) at 09/12/2023 1226 Last data filed at 09/12/2023 0844 Gross per 24 hour  Intake --  Output 1800 ml  Net -1800 ml      PHYSICAL EXAM  General: Well developed, well nourished, in no acute distress HEENT:  Normocephalic and atramatic Neck:  No JVD.  Lungs: Clear bilaterally to auscultation and percussion. Heart: HRRR . Normal S1 and S2 without gallops or murmurs.  Abdomen: Bowel sounds are positive, abdomen soft and non-tender  Msk:  Back normal, normal gait. Normal strength and tone for age. Extremities: No clubbing, cyanosis or edema.   Neuro: Alert and oriented X 3. Psych:  Good affect, responds appropriately   LABS: Basic Metabolic Panel: Recent Labs    09/11/23 0306 09/11/23 0307 09/12/23 0508  NA  --  138 137  K  --  3.5 3.5  CL  --  103 102  CO2  --  25 23  GLUCOSE  --  179* 138*  BUN  --  11 12  CREATININE  --  0.54 0.59  CALCIUM  --  8.3* 8.6*  MG 2.1  --  2.2  PHOS 2.8  --  2.7   Liver Function Tests: No results for input(s): "AST", "ALT", "ALKPHOS", "BILITOT", "PROT", "ALBUMIN" in the last 72 hours. No results for input(s): "LIPASE", "AMYLASE" in the last 72 hours. CBC: Recent Labs    09/11/23 0307 09/12/23 0508  WBC 2.9* 7.1  NEUTROABS 2.3  --   HGB 10.7* 10.5*  HCT 32.6* 32.7*  MCV 92.9 94.0  PLT 191 197   Cardiac  Enzymes: No results for input(s): "CKTOTAL", "CKMB", "CKMBINDEX", "TROPONINI" in the last 72 hours. BNP: Invalid input(s): "POCBNP" D-Dimer: No results for input(s): "DDIMER" in the last 72 hours. Hemoglobin A1C: No results for input(s): "HGBA1C" in the last 72 hours. Fasting Lipid Panel: No results for input(s): "CHOL", "HDL", "LDLCALC", "TRIG", "CHOLHDL", "LDLDIRECT" in the last 72 hours. Thyroid Function Tests: Recent Labs    09/10/23 1343  TSH 1.447   Anemia Panel: No results for input(s): "VITAMINB12", "FOLATE", "FERRITIN", "TIBC", "IRON", "RETICCTPCT" in the last 72 hours.  CT CHEST WO CONTRAST Result Date: 09/10/2023 CLINICAL DATA:  Respiratory illness with nondiagnostic x-ray. Asthma, hypothyroidism, iron-deficiency anemia, hypertension, coronary artery disease. EXAM: CT CHEST WITHOUT CONTRAST TECHNIQUE: Multidetector CT imaging of the chest was performed following the standard protocol without IV contrast. RADIATION DOSE REDUCTION: This exam was performed according to the departmental dose-optimization program which includes automated exposure control, adjustment of the mA and/or kV according to patient size and/or use of iterative reconstruction technique. COMPARISON:  Chest radiograph 09/10/2023. CT chest 10/15/2020. Cardiac CT 08/20/2022 FINDINGS: Cardiovascular: Normal heart size. No pericardial effusions. Normal caliber thoracic aorta. Scattered aortic calcification. Mediastinum/Nodes:  Esophagus is decompressed. No significant lymphadenopathy. Thyroid gland is unremarkable. Lungs/Pleura: Scarring in the lung apices and bases. Atelectasis or consolidation in the lung bases. Patchy airspace disease demonstrated in the upper lungs, greater on the right. This likely represents multifocal pneumonia. No pleural effusion or pneumothorax. Central bronchiectasis. Upper Abdomen: Subcentimeter low-attenuation lesions in the liver are too small to characterize but appear to have been present  previously, likely cysts or hemangiomas. Musculoskeletal: Degenerative changes in the spine. Compression of the lower thoracic vertebra post kyphoplasty. Postoperative change in the right shoulder. No acute bony abnormalities. IMPRESSION: 1. Patchy airspace disease in the upper lungs, greater on the right, with consolidation or atelectasis in the lung bases. This is likely multifocal pneumonia. 2. Fibrosis in the lung apices and bases.  Central bronchiectasis. 3. Aortic atherosclerosis. Electronically Signed   By: Burman Nieves M.D.   On: 09/10/2023 21:34   DG Chest 2 View Result Date: 09/10/2023 CLINICAL DATA:  Shortness of breath. EXAM: CHEST - 2 VIEW COMPARISON:  Chest radiograph dated September 08, 2023. FINDINGS: The heart size and mediastinal contours are unchanged. Streaky right-greater-than-left bibasilar opacities, favored to represent atelectasis. No focal consolidation, pleural effusion, or pneumothorax. Prior right reverse total shoulder arthroplasty and vertebroplasty of the lower thoracic spine. No acute osseous abnormality. IMPRESSION: IMPRESSION Streaky bibasilar opacities, more pronounced on the right, favored to represent atelectasis. No focal consolidation. Electronically Signed   By: Hart Robinsons M.D.   On: 09/10/2023 14:36     Echo history of left ventricular function EF of 55%  TELEMETRY: :  ASSESSMENT AND PLAN:  Principal Problem:   Acute hypoxic respiratory failure (HCC) Active Problems:   Benign essential HTN   Coronary artery disease involving native coronary artery of native heart with angina pectoris (HCC)   Atrial fibrillation with RVR (HCC)   Influenza A with pneumonia   Asthma exacerbation   (HFpEF) heart failure with preserved ejection fraction (HCC)    Plan Acute new onset atrial fibrillation rate control with Cardizem or metoprolol agree with Eliquis at least for the short-term she has a high [place] vas score but this may all be related to viral  respiratory illness Acute asthma exacerbation continue steroid therapy inhalers as necessary follow-up with pulmonary Acute hypoxic respiratory failure related to influenza A bronchitis inflammation asthma exacerbation supplemental oxygen as necessary inhalers necessary follow-up with pulmonary Chronic diastolic congestive heart failure EF around 55% continue GDMT beta-blockers consider spironolactone Multivessel coronary disease i continue aspirin statin include beta-blockade therapy Generalized weakness recommend physical therapy ambulate with assistance Have the patient follow-up with cardiology upon discharge in 1 to 2 weeks   Alwyn Pea, MD 09/12/2023 12:26 PM

## 2023-09-12 NOTE — ED Notes (Signed)
Inpatient RN messaged regarding status of pt coming to floor.

## 2023-09-12 NOTE — ED Notes (Signed)
Pt given breakfast tray at this time. 

## 2023-09-12 NOTE — Progress Notes (Signed)
[facility] Progress Note  Patient: Vanessa Evans    RUE:[id number]  DOA: 09/10/2023     Date of Service: the patient was seen and examined on 09/12/2023  Chief Complaint  Patient presents with   Shortness of Breath   Brief hospital course:  Vanessa Evans is a 78 y.o. female with medical history significant of Mild intermittent asthma, hypothyroidism, iron deficiency anemia, nonobstructive CAD, hypertension, hyperlipidemia, paroxysmal SVT, HFpEF with G1 DD, who presents to the ED due to shortness of breath.   Vanessa Evans states that everyone in her household had recently contracted influenza.  Approximately 4 days ago, she started to feel generalized malaise with bodyaches, nausea, vomiting, fever.  She was started on Tamiflu by her PCP when she tested positive for influenza at that time.  Her initial symptoms began to improve over the next few days, however for the last 1 to 2 days, she has been experiencing gradually worsening shortness of breath and intermittently productive cough.  Due to this, she came to the ED today.   ED course: On arrival to the ED, patient was normotensive at 138/74 with heart rate of 90 respiratory rate of 20.  She was saturating at 95% on 6 L.  She was afebrile at 98.4.  Initial workup notable for normal CBC and unremarkable BMP.  Troponin, lactic acid and procalcitonin negative.  Influenza A PCR positive.  Chest x-ray was obtained with streaky bibasilar opacities.  Patient started on DuoNebs, Solu-Medrol, and IV fluids.  TRH contacted for admission.   Assessment and Plan:   # Acute hypoxic respiratory failure due to influenza A viral infection and asthma exacerbation Shortness of breath got worse after developing A-fib with RVR Continue supplemental oxygen to maintain oxygen saturation above 88% Wean as tolerated to home 2 L at bedtime CT chest without contrast   # Acute Asthma exacerbation S/p Solu-Medrol 125 mg once Continue Solu-Medrol 40 mg  IV daily pending clinical improvement DuoNebs every 6 hours Pulmicort twice daily Started Brovana nebulizer twice daily Started Zyrtec and Mucinex DM as needed for cough Continue PPI for GERD  # Atrial fibrillation with RVR  Most likely due to hypoxia induced by influenza A and asthma exacerbation EKG: atrial fibrillation with RVR.  This is a new diagnosis, with history of only paroxysmal SVT.  Likely triggered by respiratory failure, influenza A, steroid use.   CHA2DS2-VASc of 5, started Eliquis 5 mg p.o. twice daily TSH 1.4 within normal range S/p Cardizem IV infusion, started Cardizem 60 every 8 hourly, Toprol-XL 25 mg p.o. 3 times daily Continue to monitor on telemetry Cardiology consult appreciated TTE pending   # Influenza A with pneumonia - Continue outpatient Tamiflu - Supportive management with Mucinex 600 twice daily and DM as needed for cough   # (HFpEF) heart failure with preserved ejection fraction History of HFpEF with EF above 55% and G1DD. - Hold home as needed torsemide given hypovolemia - Daily weights - Strict in and out    # Coronary artery disease involving native coronary artery of native heart with angina pectoris Patient reporting chest tightness, however likely demand in the setting of severe RVR.  Low suspicion for ACS.  Initial troponin negative, however will recheck.  Initial EKG with no ischemic changes, with nonspecific ischemic changes on repeat EKG, again likely due to demand. - Continue home aspirin, statin - Repeat troponin 38   # Benign essential HTN Started Cardizem, metoprolol and losartan 25 mg p.o. daily as above BP  improved, continue to monitor and titrate medications accordingly  # Constipation, started laxatives.  Body mass index is 28.08 kg/m.  Interventions:  Diet: Regular diet DVT Prophylaxis: Therapeutic Anticoagulation with Eliquis    Advance goals of care discussion: Full code  Family Communication: family was not  present at bedside, at the time of interview.  The pt provided permission to discuss medical plan with the family. Opportunity was given to ask question and all questions were answered satisfactorily.   Disposition:  Pt is from Home, admitted with Resp Failure, A. Fib RVR still has SOB and irregular rhythm, which precludes a safe discharge. Discharge to SNF, when stable.  Subjective: No significant overnight events, patient complaining of significant cough with congestion.  Denied any chest pain or palpitation, no nasal active issues. Patient was advised to try Mucinex DM as she cannot take Tussionex due to codeine   Physical Exam: General: NAD, lying comfortably Appear in no distress, affect appropriate Eyes: PERRLA ENT: Oral Mucosa Clear, moist  Neck: no JVD,  Cardiovascular: S1 and S2 Present, no Murmur, irregular rhythm Respiratory: Equal air entry bilaterally, bilateral crackles and significant wheezes Abdomen: Bowel Sound present, Soft and no tenderness,  Skin: no rashes Extremities: no Pedal edema, no calf tenderness Neurologic: without any new focal findings Gait not checked due to patient safety concerns  Vitals:   09/12/23 0745 09/12/23 0800 09/12/23 0827 09/12/23 1039  BP:  (!) 117/58 (!) 117/58 135/76  Pulse: 69 89 89 78  Resp:  17  15  Temp:  98 F (36.7 C)  97.8 F (36.6 C)  TempSrc:      SpO2: 94% 95%  91%  Weight:      Height:        Intake/Output Summary (Last 24 hours) at 09/12/2023 1322 Last data filed at 09/12/2023 0844 Gross per 24 hour  Intake --  Output 1800 ml  Net -1800 ml   Filed Weights   09/10/23 1341  Weight: 67.4 kg    Data Reviewed: I have personally reviewed and interpreted daily labs, tele strips, imagings as discussed above. I reviewed all nursing notes, pharmacy notes, vitals, pertinent old records I have discussed plan of care as described above with RN and patient/family.  CBC: Recent Labs  Lab 09/10/23 1343 09/11/23 0307  09/12/23 0508  WBC 5.8 2.9* 7.1  NEUTROABS  --  2.3  --   HGB 12.4 10.7* 10.5*  HCT 38.4 32.6* 32.7*  MCV 93.9 92.9 94.0  PLT 223 191 197   Basic Metabolic Panel: Recent Labs  Lab 09/10/23 1343 09/11/23 0306 09/11/23 0307 09/12/23 0508  NA 134*  --  138 137  K 3.9  --  3.5 3.5  CL 96*  --  103 102  CO2 26  --  25 23  GLUCOSE 101*  --  179* 138*  BUN 13  --  11 12  CREATININE 0.67  --  0.54 0.59  CALCIUM 8.8*  --  8.3* 8.6*  MG  --  2.1  --  2.2  PHOS  --  2.8  --  2.7    Studies: No results found.   Scheduled Meds:  apixaban  5 mg Oral BID   arformoterol  15 mcg Nebulization BID   aspirin EC  81 mg Oral QHS   bisacodyl  10 mg Oral QHS   bisacodyl  10 mg Oral Once   budesonide (PULMICORT) nebulizer solution  0.5 mg Nebulization Q12H   calcium-vitamin D  1  tablet Oral BID   cholecalciferol  1,000 Units Oral QHS   citalopram  20 mg Oral Daily   cyanocobalamin  500 mcg Oral Daily   diltiazem  60 mg Oral Q8H   fenofibrate  160 mg Oral Daily   folic acid  500 mcg Oral Daily   guaiFENesin  600 mg Oral BID   ipratropium-albuterol  3 mL Nebulization Q6H   levothyroxine  50 mcg Oral Q0600   loratadine  10 mg Oral Daily   losartan  25 mg Oral Daily   methylPREDNISolone (SOLU-MEDROL) injection  40 mg Intravenous Daily   metoprolol succinate  25 mg Oral Daily   oseltamivir  30 mg Oral BID   pantoprazole  40 mg Oral Daily   polyethylene glycol  17 g Oral BID   raloxifene  60 mg Oral Daily   rosuvastatin  5 mg Oral Daily   sodium chloride flush  3 mL Intravenous Q12H   Continuous Infusions:  promethazine (PHENERGAN) injection (IM or IVPB)     PRN Meds: acetaminophen, albuterol, benzonatate, bisacodyl, dextromethorphan-guaiFENesin **AND** [COMPLETED] dextromethorphan-guaiFENesin, ketotifen, promethazine (PHENERGAN) injection (IM or IVPB)  Time spent: 55 minutes  Author: Gillis Santa. MD [facility] 09/12/2023 1:22 PM  To reach On-call, see care teams to  locate the attending and reach out to them via [email or web address]. If 7PM-7AM, please contact night-coverage If you still have difficulty reaching the attending provider, please page the [facility] (Director on Call) for [facility] on amion for assistance.

## 2023-09-12 NOTE — ED Notes (Signed)
Transport put in for pt  °

## 2023-09-12 NOTE — Progress Notes (Signed)
  Echocardiogram 2D Echocardiogram has been performed.  Vanessa Evans 09/12/2023, 4:07 PM

## 2023-09-12 NOTE — ED Notes (Signed)
Pt transferred onto hospital bed. Walks to the bathroom w/ x1 assist. Incontinence care provided.

## 2023-09-12 NOTE — ED Notes (Signed)
Admit MD messaged regarding pt request for more cough medicine.

## 2023-09-12 NOTE — ED Notes (Signed)
Admit Md at bedside. Transport her for pt

## 2023-09-13 DIAGNOSIS — J9601 Acute respiratory failure with hypoxia: Secondary | ICD-10-CM | POA: Diagnosis not present

## 2023-09-13 LAB — MAGNESIUM: Magnesium: 2.1 mg/dL (ref 1.7–2.4)

## 2023-09-13 LAB — CBC
HCT: 34.1 % — ABNORMAL LOW (ref 36.0–46.0)
Hemoglobin: 11.4 g/dL — ABNORMAL LOW (ref 12.0–15.0)
MCH: 30.5 pg (ref 26.0–34.0)
MCHC: 33.4 g/dL (ref 30.0–36.0)
MCV: 91.2 fL (ref 80.0–100.0)
Platelets: 246 10*3/uL (ref 150–400)
RBC: 3.74 MIL/uL — ABNORMAL LOW (ref 3.87–5.11)
RDW: 12.9 % (ref 11.5–15.5)
WBC: 9 10*3/uL (ref 4.0–10.5)
nRBC: 0 % (ref 0.0–0.2)

## 2023-09-13 LAB — BASIC METABOLIC PANEL
Anion gap: 10 (ref 5–15)
BUN: 13 mg/dL (ref 8–23)
CO2: 26 mmol/L (ref 22–32)
Calcium: 8.6 mg/dL — ABNORMAL LOW (ref 8.9–10.3)
Chloride: 104 mmol/L (ref 98–111)
Creatinine, Ser: 0.55 mg/dL (ref 0.44–1.00)
GFR, Estimated: >60 mL/min (ref >60)
Glucose, Bld: 120 mg/dL — ABNORMAL HIGH (ref 70–99)
Potassium: 3.4 mmol/L — ABNORMAL LOW (ref 3.5–5.1)
Sodium: 140 mmol/L (ref 135–145)

## 2023-09-13 LAB — PHOSPHORUS: Phosphorus: 2.6 mg/dL (ref 2.5–4.6)

## 2023-09-13 MED ORDER — BISACODYL 5 MG PO TBEC
5.0000 mg | DELAYED_RELEASE_TABLET | Freq: Every day | ORAL | Status: DC
  Administered 2023-09-15 – 2023-09-16 (×2): 5 mg via ORAL
  Filled 2023-09-13 (×2): qty 1

## 2023-09-13 MED ORDER — BISACODYL 10 MG RE SUPP
10.0000 mg | Freq: Once | RECTAL | Status: DC
Start: 2023-09-13 — End: 2023-09-13

## 2023-09-13 MED ORDER — POTASSIUM CHLORIDE CRYS ER 20 MEQ PO TBCR
40.0000 meq | EXTENDED_RELEASE_TABLET | Freq: Once | ORAL | Status: AC
  Administered 2023-09-13: 40 meq via ORAL
  Filled 2023-09-13: qty 2

## 2023-09-13 MED ORDER — POLYETHYLENE GLYCOL 3350 17 G PO PACK
17.0000 g | PACK | Freq: Every day | ORAL | Status: DC
  Administered 2023-09-15: 17 g via ORAL
  Filled 2023-09-13 (×2): qty 1

## 2023-09-13 NOTE — TOC Initial Note (Signed)
Transition of Care 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 She has had [facility] inb the past and would like to use them again for H, [facility] has accepted the patient She has oxygen at home thru [place] to use at night but may need portable, TOC to keep a check to see if they will need portable tanks Her husband will provide transportation   Expected Discharge Plan: Home w Home Health Services Barriers to Discharge: Continued Medical Work up   Patient Goals and CMS Choice            Expected Discharge Plan and Services   Discharge Planning Services: CM Consult   Living arrangements for the past 2 months: Single Family Home                           HH Arranged: PT HH Agency: [facility] Date [facility] Agency Contacted: 09/13/23 Time HH Agency Contacted: 1500 Representative spoke with at [facility] Agency: [place]  Prior Living Arrangements/Services Living arrangements for the past 2 months: Single Family Home Lives with:: Spouse Patient language and need for interpreter reviewed:: Yes Do you feel safe going back to the place where you live?: Yes      Need for Family Participation in Patient Care: Yes (Comment) Care giver support system in place?: Yes (comment) Current home services: DME (cane, rolling walker, oxygen) Criminal Activity/Legal Involvement Pertinent to Current Situation/Hospitalization: No - Comment as needed  Activities of Daily Living   ADL Screening (condition at time of admission) Independently performs ADLs?: Yes (appropriate for developmental age) Is the  patient deaf or have difficulty hearing?: No Does the patient have difficulty seeing, even when wearing glasses/contacts?: No Does the patient have difficulty concentrating, remembering, or making decisions?: No  Permission Sought/Granted   Permission granted to share information with : Yes, Verbal Permission Granted              Emotional Assessment Appearance:: Appears stated age Attitude/Demeanor/Rapport: Engaged Affect (typically observed): Pleasant Orientation: : Oriented to Self, Oriented to Place, Oriented to  Time, Oriented to Situation Alcohol / Substance Use: Not Applicable Psych Involvement: No (comment)  Admission diagnosis:  Influenza A [J10.1] Hypoxia [R09.02] Moderate persistent asthma with exacerbation [J45.41] Acute hypoxic respiratory failure (HCC) [J96.01] Patient Active Problem List   Diagnosis Date Noted   Acute hypoxic respiratory failure (HCC) 09/10/2023   Atrial fibrillation with RVR (HCC) 09/10/2023   Influenza A with pneumonia 09/10/2023   Asthma exacerbation 09/10/2023   (HFpEF) heart failure with preserved ejection fraction (HCC) 09/10/2023   Lymphedema 01/18/2022   Coronary artery disease involving native coronary artery of native heart with angina pectoris (HCC) 10/28/2020   Peptic ulcer disease 04/21/2020   Claustrophobia 04/21/2020   Urinary tract infection without hematuria 02/04/2020   Pain in both lower extremities 12/18/2019   Inflammatory polyarthritis (HCC) 07/26/2019   Excessive daytime sleepiness 07/26/2019   Encounter for screening mammogram for malignant neoplasm of breast 07/26/2019  Intercostal muscle pain 06/25/2019   Positive ANA (antinuclear antibody) 05/01/2019   Primary osteoarthritis involving multiple joints 04/26/2019   Mild intermittent asthma without complication 08/22/2018   Seasonal allergic rhinitis due to pollen 08/22/2018   Chills with fever 08/11/2018   Sore throat 08/11/2018   Acute upper respiratory  infection 08/11/2018   Candidiasis 08/11/2018   Iron deficiency anemia 05/05/2018   Right hip pain 04/27/2018   Accidental fall from furniture 04/27/2018   Vitamin D deficiency 04/27/2018   Cough 04/18/2018   Dysuria 04/18/2018   Status post reverse total shoulder replacement, right 02/22/2018   Abnormal ECG 02/18/2018   Pain in joint of right shoulder 02/13/2018   Abdominal aortic atherosclerosis (HCC) 02/13/2018   Episode of recurrent major depressive disorder (HCC) 02/13/2018   Encounter for general adult medical examination with abnormal findings 02/13/2018   Lipoma of right shoulder 01/31/2018   Rotator cuff tendinitis, right 01/31/2018   Chronic superficial gastritis without bleeding 12/12/2017   Schatzki's ring 12/12/2017   Acute gastritis 11/08/2017   Nausea 10/18/2017   Other fatigue 10/18/2017   Allergic rhinitis 09/20/2017   Eczema 09/20/2017   Hematuria 09/20/2017   Mixed hyperlipidemia 09/20/2017   Osteoarthritis 09/20/2017   Osteoporosis, post-menopausal 09/20/2017   Palpitations 07/05/2017   Epigastric pain 05/24/2017   Gastroesophageal reflux disease without esophagitis 05/24/2017   Impingement syndrome of left shoulder 10/02/2016   Trochanteric bursitis of left hip 10/02/2016   SOB (shortness of breath) 04/16/2016   Uterine prolapse 04/09/2016   Cystocele 04/09/2016   Hyponatremia 04/09/2016   Benign essential HTN 11/18/2015   Paroxysmal supraventricular tachycardia (HCC) 11/18/2015   Premature ventricular contraction 07/20/2014   History of colonic polyps 05/29/2014   Asthma 02/05/2014   Chest pain 02/05/2014   Increased frequency of urination 02/05/2014   Tachycardia 02/05/2014   Female stress incontinence 12/14/2013   Gross hematuria 12/14/2013   Incomplete emptying of bladder 12/14/2013   Other chronic cystitis without hematuria 12/14/2013   PCP:  Carlean Jews, PA-C Pharmacy:   [facility] - [place], [place] - [address] [place] [address] Phone: [phone] Fax: [phone]  [id number] [place], [place] - [address] AT [facility] [address] [place] [address] [place] [address] Phone: [phone] Fax: [phone]     Social Drivers of Health (SDOH) Social History: SDOH Screenings   Food Insecurity: No Food Insecurity (09/12/2023)  Housing: Low Risk  (09/12/2023)  Transportation Needs: No Transportation Needs (09/12/2023)  Utilities: Not At Risk (09/12/2023)  Alcohol Screen: Low Risk  (07/12/2023)  Depression (PHQ2-9): Low Risk  (07/12/2023)  Social Connections: Unknown (09/12/2023)  Tobacco Use: Medium Risk (09/10/2023)   SDOH Interventions:     Readmission Risk Interventions     No data to display

## 2023-09-13 NOTE — Progress Notes (Signed)
Patient ID: Vanessa Evans, female   DOB: 03-04-1946, 78 y.o.   MRN: [id number] [facility]    SUBJECTIVE: Patient still complains of shortness of breath dyspnea no wheezing mild cough minimal sputum production but feels like she is improving she still has generalized weakness has not ambulated yet much and may need physical therapy complains of a headache sore mild cough no fever chills or sweats denies any palpitations or tachycardia no chest pain   Vitals:   09/12/23 2120 09/13/23 0500 09/13/23 0548 09/13/23 0548  BP: (!) 127/55  (!) 125/55 (!) 140/65  Pulse: 82   75  Resp: 20   18  Temp: 98.2 F (36.8 C)   97.8 F (36.6 C)  TempSrc:      SpO2: 99%   95%  Weight:  68 kg    Height:         Intake/Output Summary (Last 24 hours) at 09/13/2023 0710 Last data filed at 09/13/2023 0600 Gross per 24 hour  Intake 157.19 ml  Output 2750 ml  Net -2592.81 ml      PHYSICAL EXAM  General: Well developed, well nourished, in no acute distress HEENT:  Normocephalic and atramatic Neck:  No JVD.  Lungs: Clear bilaterally to auscultation and percussion. Heart: HRRR . Normal S1 and S2 without gallops or murmurs.  Abdomen: Bowel sounds are positive, abdomen soft and non-tender  Msk:  Back normal, normal gait. Normal strength and tone for age. Extremities: No clubbing, cyanosis or edema.   Neuro: Alert and oriented X 3. Psych:  Good affect, responds appropriately   LABS: Basic Metabolic Panel: Recent Labs    09/12/23 0508 09/13/23 0452  NA 137 140  K 3.5 3.4*  CL 102 104  CO2 23 26  GLUCOSE 138* 120*  BUN 12 13  CREATININE 0.59 0.55  CALCIUM 8.6* 8.6*  MG 2.2 2.1  PHOS 2.7 2.6   Liver Function Tests: No results for input(s): "AST", "ALT", "ALKPHOS", "BILITOT", "PROT", "ALBUMIN" in the last 72 hours. No results for input(s): "LIPASE", "AMYLASE" in the last 72 hours. CBC: Recent Labs    09/11/23 0307 09/12/23 0508 09/13/23 0452  WBC 2.9* 7.1 9.0  NEUTROABS 2.3  --    --   HGB 10.7* 10.5* 11.4*  HCT 32.6* 32.7* 34.1*  MCV 92.9 94.0 91.2  PLT 191 197 246   Cardiac Enzymes: No results for input(s): "CKTOTAL", "CKMB", "CKMBINDEX", "TROPONINI" in the last 72 hours. BNP: Invalid input(s): "POCBNP" D-Dimer: No results for input(s): "DDIMER" in the last 72 hours. Hemoglobin A1C: No results for input(s): "HGBA1C" in the last 72 hours. Fasting Lipid Panel: No results for input(s): "CHOL", "HDL", "LDLCALC", "TRIG", "CHOLHDL", "LDLDIRECT" in the last 72 hours. Thyroid Function Tests: Recent Labs    09/10/23 1343  TSH 1.447   Anemia Panel: No results for input(s): "VITAMINB12", "FOLATE", "FERRITIN", "TIBC", "IRON", "RETICCTPCT" in the last 72 hours.  ECHOCARDIOGRAM COMPLETE Result Date: 09/12/2023    ECHOCARDIOGRAM REPORT   Patient Name:   Vanessa Evans Date of Exam: 09/12/2023 Medical Rec #:  [id number]         Height:       61.0 in Accession #:    [id number]        Weight:       148.6 lb Date of Birth:  [birth year]         BSA:          1.665 m Patient Age:    26 years  BP:           135/76 mmHg Patient Gender: F                 HR:           76 bpm. Exam Location:  [facility] Procedure: 2D Echo Indications:     Atrial Fibrillation I48.91  History:         Patient has prior history of Echocardiogram examinations, most                  recent 04/16/2016.  Sonographer:     Overton Mam RDCS, FASE Referring Phys:  [id number] Verdene Lennert Diagnosing Phys: Alwyn Pea MD  Sonographer Comments: Technically difficult study due to poor echo windows. IMPRESSIONS  1. Left ventricular ejection fraction, by estimation, is 55 to 60%. The left ventricle has normal function. The left ventricle has no regional wall motion abnormalities. There is mild concentric left ventricular hypertrophy. Left ventricular diastolic parameters are consistent with Grade I diastolic dysfunction (impaired relaxation).  2. Right ventricular systolic function is normal. The right ventricular  size is normal.  3. The mitral valve is normal in structure. Trivial mitral valve regurgitation.  4. The aortic valve is normal in structure. Aortic valve regurgitation is not visualized. FINDINGS  Left Ventricle: Left ventricular ejection fraction, by estimation, is 55 to 60%. The left ventricle has normal function. The left ventricle has no regional wall motion abnormalities. The left ventricular internal cavity size was normal in size. There is  mild concentric left ventricular hypertrophy. Left ventricular diastolic parameters are consistent with Grade I diastolic dysfunction (impaired relaxation). Right Ventricle: The right ventricular size is normal. No increase in right ventricular wall thickness. Right ventricular systolic function is normal. Left Atrium: Left atrial size was normal in size. Right Atrium: Right atrial size was normal in size. Pericardium: There is no evidence of pericardial effusion. Mitral Valve: The mitral valve is normal in structure. Trivial mitral valve regurgitation. Tricuspid Valve: The tricuspid valve is normal in structure. Tricuspid valve regurgitation is mild. Aortic Valve: The aortic valve is normal in structure. Aortic valve regurgitation is not visualized. Aortic valve peak gradient measures 7.5 mmHg. Pulmonic Valve: The pulmonic valve was normal in structure. Pulmonic valve regurgitation is not visualized. Aorta: The ascending aorta was not well visualized. IAS/Shunts: No atrial level shunt detected by color flow Doppler.  LEFT VENTRICLE PLAX 2D LVIDd:         3.70 cm   Diastology LVIDs:         2.60 cm   LV e' medial:    6.96 cm/s LV PW:         1.30 cm   LV E/e' medial:  12.9 LV IVS:        1.30 cm   LV e' lateral:   10.70 cm/s LVOT diam:     1.80 cm   LV E/e' lateral: 8.4 LV SV:         70 LV SV Index:   42 LVOT Area:     2.54 cm  RIGHT VENTRICLE RV Basal diam:  3.10 cm RV S prime:     14.00 cm/s TAPSE (M-mode): 1.7 cm LEFT ATRIUM           Index        RIGHT ATRIUM            Index LA diam:      3.60 cm 2.16 cm/m   RA Area:  11.00 cm LA Vol (A2C): 18.8 ml 11.29 ml/m  RA Volume:   25.10 ml  15.08 ml/m LA Vol (A4C): 28.2 ml 16.94 ml/m  AORTIC VALVE                 PULMONIC VALVE AV Area (Vmax): 2.30 cm     PV Vmax:        0.96 m/s AV Vmax:        137.00 cm/s  PV Peak grad:   3.7 mmHg AV Peak Grad:   7.5 mmHg     RVOT Peak grad: 4 mmHg LVOT Vmax:      124.00 cm/s LVOT Vmean:     81.000 cm/s LVOT VTI:       0.277 m  AORTA Ao Root diam: 2.90 cm Ao Asc diam:  2.80 cm MITRAL VALVE               TRICUSPID VALVE MV Area (PHT): 3.53 cm    TR Peak grad:   18.1 mmHg MV Decel Time: 215 msec    TR Vmax:        213.00 cm/s MV E velocity: 90.10 cm/s MV A velocity: 98.00 cm/s  SHUNTS MV E/A ratio:  0.92        Systemic VTI:  0.28 m                            Systemic Diam: 1.80 cm Cora Stetson D Glenette Bookwalter MD Electronically signed by Alwyn Pea MD Signature Date/Time: 09/12/2023/10:15:09 PM    Final      Echo of left ventricular function EF around 55%  TELEMETRY: : Normal sinus rhythm rate of 75 nonspecific ST-T wave changes  ASSESSMENT AND PLAN:  Principal Problem:   Acute hypoxic respiratory failure (HCC) Active Problems:   Benign essential HTN   Coronary artery disease involving native coronary artery of native heart with angina pectoris (HCC)   Atrial fibrillation with RVR (HCC)   Influenza A with pneumonia   Asthma exacerbation   (HFpEF) heart failure with preserved ejection fraction (HCC)    Plan Acute hypoxic respiratory failure secondary to influenza A infection continue inhalers supplemental oxygen supportive care including Tamiflu Acute asthma exacerbation agree with steroid therapy DuoNebs Pulmicort inhalers Mucinex for cough Atrial fibrillation RVR recommend rate control Cardizem metoprolol with Eliquis for anticoagulation significant CHA2DS2-VASc score Influenza pneumonia treated with Tamiflu Mucinex supportive care inhalers Coronary artery disease chest  tightness flat troponins probably related to underlying condition resulting in demand Hypertension continue Cardizem losartan metoprolol Consider physical therapy to help with ambulation and strength training balance   Alwyn Pea, MD, 09/13/2023 7:10 AM

## 2023-09-13 NOTE — Progress Notes (Signed)
Physical Therapy Treatment Patient Details Name: Vanessa Evans MRN: [id number] DOB: 1946-03-05 Today's Date: 09/13/2023   History of Present Illness Pt admitted to [facility] on 09/10/23 for c/o worsening SOB with hypoxia that improved with supplemental O2. Dx with acute hypoxic respiratory failure secondary to the Flu. Significant PMH includes:  heart murmur, GERD, hypertension, hyperlipidemia, migraine headaches, hypothyroidism, asthma.    PT Comments  Pt alert, agreeable to PT. spO2 >90% on 3L throughout, HR WFLs. Pt noted for tremors but had recently had a breathing treatment. Overall the pt demonstrated good progress towards goals. Supine to sit with supervision, and able to sit with fair balance. Sit <> stand with RW and CGA, cues for hand placement. Two bouts of ambulation with extended seated rest break between, and pt needed several standing rest breaks during session as well. Pt up in chair with needs in reach at end of session. The patient would benefit from further skilled PT intervention to continue to progress towards goals.     If plan is discharge home, recommend the following: A little help with walking and/or transfers;A little help with bathing/dressing/bathroom;Assistance with cooking/housework;Assist for transportation;Help with stairs or ramp for entrance   Can travel by private vehicle     Yes  Equipment Recommendations  None recommended by PT    Recommendations for Other Services       Precautions / Restrictions Precautions Precautions: Fall Restrictions Weight Bearing Restrictions Per Provider Order: No Other Position/Activity Restrictions: SpO2 >/= 92%     Mobility  Bed Mobility Overal bed mobility: Needs Assistance Bed Mobility: Supine to Sit     Supine to sit: Supervision          Transfers Overall transfer level: Needs assistance Equipment used: Rolling walker (2 wheels) Transfers: Sit to/from Stand Sit to Stand: Contact guard assist            General transfer comment: cued for hand placement    Ambulation/Gait Ambulation/Gait assistance: Contact guard assist Gait Distance (Feet):  (93ft, seated rest break additional 56ft) Assistive device: Rolling walker (2 wheels)             Stairs             Wheelchair Mobility     Tilt Bed    Modified Rankin (Stroke Patients Only)       Balance Overall balance assessment: Needs assistance Sitting-balance support: Bilateral upper extremity supported, Feet unsupported Sitting balance-Leahy Scale: Fair     Standing balance support: During functional activity, Reliant on assistive device for balance, Bilateral upper extremity supported Standing balance-Leahy Scale: Fair                              Cognition Arousal: Alert Behavior During Therapy: WFL for tasks assessed/performed Overall Cognitive Status: Within Functional Limits for tasks assessed                                          Exercises      General Comments        Pertinent Vitals/Pain Pain Assessment Pain Assessment: No/denies pain    Home Living                          Prior Function  PT Goals (current goals can now be found in the care plan section) Progress towards PT goals: Progressing toward goals    Frequency    Min 1X/week      PT Plan      Co-evaluation              AM-PAC PT "6 Clicks" Mobility   Outcome Measure  Help needed turning from your back to your side while in a flat bed without using bedrails?: A Little Help needed moving from lying on your back to sitting on the side of a flat bed without using bedrails?: A Little Help needed moving to and from a bed to a chair (including a wheelchair)?: A Little Help needed standing up from a chair using your arms (e.g., wheelchair or bedside chair)?: A Little Help needed to walk in hospital room?: A Little Help needed climbing 3-5 steps with a railing? : A  Lot 6 Click Score: 17    End of Session Equipment Utilized During Treatment: Oxygen (3L) Activity Tolerance: Patient tolerated treatment well Patient left: in chair;with call bell/phone within reach Nurse Communication: Mobility status PT Visit Diagnosis: Unsteadiness on feet (R26.81);Muscle weakness (generalized) (M62.81);Pain Pain - Right/Left:  (back)     Time: 1610-9604 PT Time Calculation (min) (ACUTE ONLY): 24 min  Charges:    $Therapeutic Activity: 23-37 mins PT General Charges $$ ACUTE PT VISIT: 1 Visit                    Olga Coaster PT, DPT 2:51 PM,09/13/23

## 2023-09-13 NOTE — Progress Notes (Signed)
[facility] Progress Note  Patient: Vanessa Evans    NGE:[id number]  DOA: 09/10/2023     Date of Service: the patient was seen and examined on 09/13/2023  Chief Complaint  Patient presents with   Shortness of Breath   Brief hospital course:  Vanessa Evans is a 78 y.o. female with medical history significant of Mild intermittent asthma, hypothyroidism, iron deficiency anemia, nonobstructive CAD, hypertension, hyperlipidemia, paroxysmal SVT, HFpEF with G1 DD, who presents to the ED due to shortness of breath.   Vanessa Evans states that everyone in her household had recently contracted influenza.  Approximately 4 days ago, she started to feel generalized malaise with bodyaches, nausea, vomiting, fever.  She was started on Tamiflu by her PCP when she tested positive for influenza at that time.  Her initial symptoms began to improve over the next few days, however for the last 1 to 2 days, she has been experiencing gradually worsening shortness of breath and intermittently productive cough.  Due to this, she came to the ED today.   ED course: On arrival to the ED, patient was normotensive at 138/74 with heart rate of 90 respiratory rate of 20.  She was saturating at 95% on 6 L.  She was afebrile at 98.4.  Initial workup notable for normal CBC and unremarkable BMP.  Troponin, lactic acid and procalcitonin negative.  Influenza A PCR positive.  Chest x-ray was obtained with streaky bibasilar opacities.  Patient started on DuoNebs, Solu-Medrol, and IV fluids.  [facility] contacted for admission.   Assessment and Plan:   # Acute hypoxic respiratory failure due to influenza A viral infection and asthma exacerbation Shortness of breath got worse after developing A-fib with RVR Continue supplemental oxygen to maintain oxygen saturation above 88% Wean as tolerated to home 2 L at bedtime CT chest without contrast   # Acute Asthma exacerbation S/p Solu-Medrol 125 mg once Continue Solu-Medrol 40 mg  IV daily pending clinical improvement DuoNebs every 6 hours Pulmicort twice daily Started Brovana nebulizer twice daily Started Zyrtec and Mucinex DM as needed for cough Continue PPI for GERD  # Atrial fibrillation with RVR  Most likely due to hypoxia induced by influenza A and asthma exacerbation EKG: atrial fibrillation with RVR.  This is a new diagnosis, with history of only paroxysmal SVT.  Likely triggered by respiratory failure, influenza A, steroid use.   CHA2DS2-VASc of 5, started Eliquis 5 mg p.o. twice daily TSH 1.4 within normal range S/p Cardizem IV infusion, started Cardizem 60 every 8 hourly, Toprol-XL 25 mg p.o. 3 times daily Continue to monitor on telemetry Cardiology consult appreciated TTE LVEF 55 to 60%, grade 1 diastolic dysfunction, no any other significant findings.   # Influenza A with pneumonia - Continue outpatient Tamiflu - Supportive management with Mucinex 600 twice daily and DM as needed for cough   # (HFpEF) heart failure with preserved ejection fraction History of HFpEF with EF above 55% and G1DD. - Hold home as needed torsemide given hypovolemia - Daily weights - Strict in and out    # Coronary artery disease involving native coronary artery of native heart with angina pectoris Patient reporting chest tightness, however likely demand in the setting of severe RVR.  Low suspicion for ACS.  Initial troponin negative, however will recheck.  Initial EKG with no ischemic changes, with nonspecific ischemic changes on repeat EKG, again likely due to demand. - Continue home aspirin, statin - Repeat troponin 38   # Benign essential HTN  Started Cardizem, metoprolol and losartan 25 mg p.o. daily as above BP improved, continue to monitor and titrate medications accordingly  # Constipation, resolved, continue laxatives.   # Hypothyroid, continue Synthroid 50 mcg po  Daily    Body mass index is 28.34 kg/m.  Interventions:  Diet: Regular diet DVT  Prophylaxis: Therapeutic Anticoagulation with Eliquis    Advance goals of care discussion: Full code  Family Communication: family was not present at bedside, at the time of interview.  The pt provided permission to discuss medical plan with the family. Opportunity was given to ask question and all questions were answered satisfactorily.   Disposition:  Pt is from Home, admitted with Resp Failure, A. Fib RVR still has SOB, which precludes a safe discharge. Discharge to SNF, when stable.  Subjective: No significant overnight events, patient still has significant productive cough and shortness of breath and wheezes.  Overall feels significant improvement.  Patient did move bowels x 2 episodes today. Denied any chest pain or repressing, no new abdominal pain, no nausea vomiting.   Physical Exam: General: NAD, lying comfortably Appear in no distress, affect appropriate Eyes: PERRLA ENT: Oral Mucosa Clear, moist  Neck: no JVD,  Cardiovascular: S1 and S2 Present, no Murmur, irregular rhythm Respiratory: Equal air entry bilaterally, bilateral crackles and wheezes, slight improvement as compared to yesterday Abdomen: Bowel Sound present, Soft and no tenderness,  Skin: no rashes Extremities: no Pedal edema, no calf tenderness Neurologic: without any new focal findings Gait not checked due to patient safety concerns  Vitals:   09/13/23 0500 09/13/23 0548 09/13/23 0548 09/13/23 0950  BP:  (!) 125/55 (!) 140/65 136/67  Pulse:   75 70  Resp:   18 18  Temp:   97.8 F (36.6 C) 97.9 F (36.6 C)  TempSrc:    Oral  SpO2:   95% 98%  Weight: 68 kg     Height:        Intake/Output Summary (Last 24 hours) at 09/13/2023 1418 Last data filed at 09/13/2023 1300 Gross per 24 hour  Intake 377.19 ml  Output 2050 ml  Net -1672.81 ml   Filed Weights   09/10/23 1341 09/13/23 0500  Weight: 67.4 kg 68 kg    Data Reviewed: I have personally reviewed and interpreted daily labs, tele strips, imagings  as discussed above. I reviewed all nursing notes, pharmacy notes, vitals, pertinent old records I have discussed plan of care as described above with RN and patient/family.  CBC: Recent Labs  Lab 09/10/23 1343 09/11/23 0307 09/12/23 0508 09/13/23 0452  WBC 5.8 2.9* 7.1 9.0  NEUTROABS  --  2.3  --   --   HGB 12.4 10.7* 10.5* 11.4*  HCT 38.4 32.6* 32.7* 34.1*  MCV 93.9 92.9 94.0 91.2  PLT 223 191 197 246   Basic Metabolic Panel: Recent Labs  Lab 09/10/23 1343 09/11/23 0306 09/11/23 0307 09/12/23 0508 09/13/23 0452  NA 134*  --  138 137 140  K 3.9  --  3.5 3.5 3.4*  CL 96*  --  103 102 104  CO2 26  --  25 23 26   GLUCOSE 101*  --  179* 138* 120*  BUN 13  --  11 12 13   CREATININE 0.67  --  0.54 0.59 0.55  CALCIUM 8.8*  --  8.3* 8.6* 8.6*  MG  --  2.1  --  2.2 2.1  PHOS  --  2.8  --  2.7 2.6    Studies: ECHOCARDIOGRAM COMPLETE  Result Date: 09/12/2023    ECHOCARDIOGRAM REPORT   Patient Name:   GENAVIVE KUBICKI Degroff Date of Exam: 09/12/2023 Medical Rec #:  [id number]         Height:       61.0 in Accession #:    [id number]        Weight:       148.6 lb Date of Birth:  10/24/1945         BSA:          1.665 m Patient Age:    63 years          BP:           135/76 mmHg Patient Gender: F                 HR:           76 bpm. Exam Location:  [facility] Procedure: 2D Echo Indications:     Atrial Fibrillation I48.91  History:         Patient has prior history of Echocardiogram examinations, most                  recent 04/16/2016.  Sonographer:     Overton Mam RDCS, FASE Referring Phys:  [id number] Verdene Lennert Diagnosing Phys: Alwyn Pea MD  Sonographer Comments: Technically difficult study due to poor echo windows. IMPRESSIONS  1. Left ventricular ejection fraction, by estimation, is 55 to 60%. The left ventricle has normal function. The left ventricle has no regional wall motion abnormalities. There is mild concentric left ventricular hypertrophy. Left ventricular diastolic parameters are  consistent with Grade I diastolic dysfunction (impaired relaxation).  2. Right ventricular systolic function is normal. The right ventricular size is normal.  3. The mitral valve is normal in structure. Trivial mitral valve regurgitation.  4. The aortic valve is normal in structure. Aortic valve regurgitation is not visualized. FINDINGS  Left Ventricle: Left ventricular ejection fraction, by estimation, is 55 to 60%. The left ventricle has normal function. The left ventricle has no regional wall motion abnormalities. The left ventricular internal cavity size was normal in size. There is  mild concentric left ventricular hypertrophy. Left ventricular diastolic parameters are consistent with Grade I diastolic dysfunction (impaired relaxation). Right Ventricle: The right ventricular size is normal. No increase in right ventricular wall thickness. Right ventricular systolic function is normal. Left Atrium: Left atrial size was normal in size. Right Atrium: Right atrial size was normal in size. Pericardium: There is no evidence of pericardial effusion. Mitral Valve: The mitral valve is normal in structure. Trivial mitral valve regurgitation. Tricuspid Valve: The tricuspid valve is normal in structure. Tricuspid valve regurgitation is mild. Aortic Valve: The aortic valve is normal in structure. Aortic valve regurgitation is not visualized. Aortic valve peak gradient measures 7.5 mmHg. Pulmonic Valve: The pulmonic valve was normal in structure. Pulmonic valve regurgitation is not visualized. Aorta: The ascending aorta was not well visualized. IAS/Shunts: No atrial level shunt detected by color flow Doppler.  LEFT VENTRICLE PLAX 2D LVIDd:         3.70 cm   Diastology LVIDs:         2.60 cm   LV e' medial:    6.96 cm/s LV PW:         1.30 cm   LV E/e' medial:  12.9 LV IVS:        1.30 cm   LV e' lateral:   10.70 cm/s LVOT diam:  1.80 cm   LV E/e' lateral: 8.4 LV SV:         70 LV SV Index:   42 LVOT Area:     2.54 cm   RIGHT VENTRICLE RV Basal diam:  3.10 cm RV S prime:     14.00 cm/s TAPSE (M-mode): 1.7 cm LEFT ATRIUM           Index        RIGHT ATRIUM           Index LA diam:      3.60 cm 2.16 cm/m   RA Area:     11.00 cm LA Vol (A2C): 18.8 ml 11.29 ml/m  RA Volume:   25.10 ml  15.08 ml/m LA Vol (A4C): 28.2 ml 16.94 ml/m  AORTIC VALVE                 PULMONIC VALVE AV Area (Vmax): 2.30 cm     PV Vmax:        0.96 m/s AV Vmax:        137.00 cm/s  PV Peak grad:   3.7 mmHg AV Peak Grad:   7.5 mmHg     RVOT Peak grad: 4 mmHg LVOT Vmax:      124.00 cm/s LVOT Vmean:     81.000 cm/s LVOT VTI:       0.277 m  AORTA Ao Root diam: 2.90 cm Ao Asc diam:  2.80 cm MITRAL VALVE               TRICUSPID VALVE MV Area (PHT): 3.53 cm    TR Peak grad:   18.1 mmHg MV Decel Time: 215 msec    TR Vmax:        213.00 cm/s MV E velocity: 90.10 cm/s MV A velocity: 98.00 cm/s  SHUNTS MV E/A ratio:  0.92        Systemic VTI:  0.28 m                            Systemic Diam: 1.80 cm Dwayne D Callwood MD Electronically signed by Alwyn Pea MD Signature Date/Time: 09/12/2023/10:15:09 PM    Final      Scheduled Meds:  apixaban  5 mg Oral BID   arformoterol  15 mcg Nebulization BID   aspirin EC  81 mg Oral QHS   bisacodyl  10 mg Oral QHS   bisacodyl  10 mg Rectal Once   budesonide (PULMICORT) nebulizer solution  0.5 mg Nebulization Q12H   calcium-vitamin D  1 tablet Oral BID   cholecalciferol  1,000 Units Oral QHS   citalopram  20 mg Oral Daily   cyanocobalamin  500 mcg Oral Daily   diltiazem  60 mg Oral Q8H   fenofibrate  160 mg Oral Daily   folic acid  500 mcg Oral Daily   guaiFENesin  600 mg Oral BID   ipratropium-albuterol  3 mL Nebulization Q6H   levothyroxine  50 mcg Oral Q0600   loratadine  10 mg Oral Daily   losartan  25 mg Oral Daily   methylPREDNISolone (SOLU-MEDROL) injection  40 mg Intravenous Daily   metoprolol succinate  25 mg Oral Daily   oseltamivir  30 mg Oral BID   pantoprazole  40 mg Oral Daily    polyethylene glycol  17 g Oral BID   raloxifene  60 mg Oral Daily   rosuvastatin  5 mg Oral Daily  sodium chloride flush  3 mL Intravenous Q12H   Continuous Infusions:  promethazine (PHENERGAN) injection (IM or IVPB)     PRN Meds: acetaminophen, albuterol, benzonatate, bisacodyl, dextromethorphan-guaiFENesin **AND** [COMPLETED] dextromethorphan-guaiFENesin, ketotifen, promethazine (PHENERGAN) injection (IM or IVPB)  Time spent: 55 minutes  Author: Gillis Santa. MD [facility] 09/13/2023 2:18 PM  To reach On-call, see care teams to locate the attending and reach out to them via [email or web address]. If 7PM-7AM, please contact night-coverage If you still have difficulty reaching the attending provider, please page the [facility] (Director on Call) for [facility] on amion for assistance.

## 2023-09-13 NOTE — Care Management Important Message (Signed)
Important Message  Patient Details  Name: Vanessa Evans MRN: [id number] Date of Birth: [birth year]   Important Message Given:  Yes - Medicare IM     Cristela Blue, CMA 09/13/2023, 10:07 AM

## 2023-09-13 NOTE — Plan of Care (Signed)
  Problem: Clinical Measurements: Goal: Ability to maintain clinical measurements within normal limits will improve Outcome: Progressing Goal: Diagnostic test results will improve Outcome: Progressing Goal: Respiratory complications will improve Outcome: Progressing   Problem: Activity: Goal: Risk for activity intolerance will decrease Outcome: Progressing   Problem: Coping: Goal: Level of anxiety will decrease Outcome: Progressing   Problem: Safety: Goal: Ability to remain free from injury will improve Outcome: Progressing   Problem: Respiratory: Goal: Levels of oxygenation will improve Outcome: Progressing

## 2023-09-13 NOTE — Discharge Instructions (Signed)
 [facility].  [address] , [place], [place], [address]. [phone] They will call you to arrange when they can come to see you

## 2023-09-14 DIAGNOSIS — J9601 Acute respiratory failure with hypoxia: Secondary | ICD-10-CM | POA: Diagnosis not present

## 2023-09-14 LAB — CBC
HCT: 37.4 % (ref 36.0–46.0)
Hemoglobin: 12.3 g/dL (ref 12.0–15.0)
MCH: 29.8 pg (ref 26.0–34.0)
MCHC: 32.9 g/dL (ref 30.0–36.0)
MCV: 90.6 fL (ref 80.0–100.0)
Platelets: 312 10*3/uL (ref 150–400)
RBC: 4.13 MIL/uL (ref 3.87–5.11)
RDW: 12.7 % (ref 11.5–15.5)
WBC: 8.3 10*3/uL (ref 4.0–10.5)
nRBC: 0 % (ref 0.0–0.2)

## 2023-09-14 LAB — BASIC METABOLIC PANEL
Anion gap: 9 (ref 5–15)
BUN: 12 mg/dL (ref 8–23)
CO2: 26 mmol/L (ref 22–32)
Calcium: 8.9 mg/dL (ref 8.9–10.3)
Chloride: 101 mmol/L (ref 98–111)
Creatinine, Ser: 0.52 mg/dL (ref 0.44–1.00)
GFR, Estimated: >60 mL/min (ref >60)
Glucose, Bld: 121 mg/dL — ABNORMAL HIGH (ref 70–99)
Potassium: 4.1 mmol/L (ref 3.5–5.1)
Sodium: 136 mmol/L (ref 135–145)

## 2023-09-14 LAB — MAGNESIUM: Magnesium: 2.4 mg/dL (ref 1.7–2.4)

## 2023-09-14 LAB — PHOSPHORUS: Phosphorus: 3.2 mg/dL (ref 2.5–4.6)

## 2023-09-14 MED ORDER — FUROSEMIDE 10 MG/ML IJ SOLN
40.0000 mg | Freq: Once | INTRAMUSCULAR | Status: AC
  Administered 2023-09-14: 40 mg via INTRAVENOUS
  Filled 2023-09-14: qty 4

## 2023-09-14 MED ORDER — ORAL CARE MOUTH RINSE
15.0000 mL | OROMUCOSAL | Status: DC | PRN
  Administered 2023-09-14: 15 mL via OROMUCOSAL

## 2023-09-14 MED ORDER — AMOXICILLIN-POT CLAVULANATE 875-125 MG PO TABS
1.0000 | ORAL_TABLET | Freq: Two times a day (BID) | ORAL | Status: DC
  Administered 2023-09-14 – 2023-09-17 (×7): 1 via ORAL
  Filled 2023-09-14 (×7): qty 1

## 2023-09-14 MED ORDER — DILTIAZEM HCL ER COATED BEADS 180 MG PO CP24
180.0000 mg | ORAL_CAPSULE | Freq: Every day | ORAL | Status: DC
  Administered 2023-09-14 – 2023-09-17 (×4): 180 mg via ORAL
  Filled 2023-09-14 (×4): qty 1

## 2023-09-14 NOTE — Progress Notes (Signed)
 [facility] Cardiology    SUBJECTIVE: Patient improving slowly still using inhalers still somewhat short of breath with some mild cough but improving no significant sputum production no fever chills or sweats no wheezing no chest pain   Vitals:   09/14/23 0630 09/14/23 0826 09/14/23 1335 09/14/23 1637  BP: (!) 141/67 124/65  122/72  Pulse:  87  94  Resp:  17  17  Temp:  (!) 97.5 F (36.4 C)  98.8 F (37.1 C)  TempSrc:      SpO2:  97% 96% 94%  Weight:      Height:         Intake/Output Summary (Last 24 hours) at 09/14/2023 1846 Last data filed at 09/14/2023 1559 Gross per 24 hour  Intake 370 ml  Output 3825 ml  Net -3455 ml      PHYSICAL EXAM  General: Well developed, well nourished, in no acute distress HEENT:  Normocephalic and atramatic Neck:  No JVD.  Lungs: Clear bilaterally to auscultation and percussion. Heart: HRRR . Normal S1 and S2 without gallops or murmurs.  Abdomen: Bowel sounds are positive, abdomen soft and non-tender  Msk:  Back normal, normal gait. Normal strength and tone for age. Extremities: No clubbing, cyanosis or edema.   Neuro: Alert and oriented X 3. Psych:  Good affect, responds appropriately   LABS: Basic Metabolic Panel: Recent Labs    09/13/23 0452 09/14/23 0524  NA 140 136  K 3.4* 4.1  CL 104 101  CO2 26 26  GLUCOSE 120* 121*  BUN 13 12  CREATININE 0.55 0.52  CALCIUM  8.6* 8.9  MG 2.1 2.4  PHOS 2.6 3.2   Liver Function Tests: No results for input(s): AST, ALT, ALKPHOS, BILITOT, PROT, ALBUMIN in the last 72 hours. No results for input(s): LIPASE, AMYLASE in the last 72 hours. CBC: Recent Labs    09/13/23 0452 09/14/23 0524  WBC 9.0 8.3  HGB 11.4* 12.3  HCT 34.1* 37.4  MCV 91.2 90.6  PLT 246 312   Cardiac Enzymes: No results for input(s): CKTOTAL, CKMB, CKMBINDEX, TROPONINI in the last 72 hours. BNP: Invalid input(s): POCBNP D-Dimer: No results for input(s): DDIMER in the last 72  hours. Hemoglobin A1C: No results for input(s): HGBA1C in the last 72 hours. Fasting Lipid Panel: No results for input(s): CHOL, HDL, LDLCALC, TRIG, CHOLHDL, LDLDIRECT in the last 72 hours. Thyroid  Function Tests: No results for input(s): TSH, T4TOTAL, T3FREE, THYROIDAB in the last 72 hours.  Invalid input(s): FREET3 Anemia Panel: No results for input(s): VITAMINB12, FOLATE, FERRITIN, TIBC, IRON, RETICCTPCT in the last 72 hours.  No results found.   Echo preserved left ventricular function EF around 55%  TELEMETRY: Normal sinus rhythm rate of about 80 nonspecific ST-T changes:  ASSESSMENT AND PLAN:  Principal Problem:   Acute hypoxic respiratory failure (HCC) Active Problems:   Benign essential HTN   Coronary artery disease involving native coronary artery of native heart with angina pectoris (HCC)   Atrial fibrillation with RVR (HCC)   Influenza A with pneumonia   Asthma exacerbation   (HFpEF) heart failure with preserved ejection fraction (HCC)    Plan Shortness of breath persistent improving slowly with respiratory management Tamiflu  inhalers supplemental oxygen  as per pulmonary Acute hypoxic respiratory failure secondary to influenza A infection continue inhalers supplemental oxygen  supportive care including Tamiflu  Acute asthma exacerbation agree with steroid therapy DuoNebs Pulmicort  inhalers Mucinex  for cough Atrial fibrillation RVR recommend rate control Cardizem  metoprolol  with Eliquis  for anticoagulation significant CHA2DS2-VASc score Influenza  pneumonia treated with Tamiflu  Mucinex  supportive care inhalers Coronary artery disease chest tightness flat troponins probably related to underlying condition resulting in demand Hypertension continue Cardizem  losartan  metoprolol  Recommend increase activity including physical therapy to help with strength training balance Recommended the patient follow-up with cardiology as an  outpatient   Vanessa JONETTA Lovelace, MD,   09/14/2023 6:46 PM

## 2023-09-14 NOTE — Progress Notes (Signed)
 [facility] Progress Note  Patient: Vanessa Evans    FMW:[id number]  DOA: 09/10/2023     Date of Service: the patient was seen and examined on 09/14/2023  Chief Complaint  Patient presents with   Shortness of Breath   Brief hospital course:  Vanessa Evans is a 78 y.o. female with medical history significant of Mild intermittent asthma, hypothyroidism, iron deficiency anemia, nonobstructive CAD, hypertension, hyperlipidemia, paroxysmal SVT, HFpEF with G1 DD, who presents to the ED due to shortness of breath.   Vanessa Evans states that everyone in her household had recently contracted influenza.  Approximately 4 days ago, she started to feel generalized malaise with bodyaches, nausea, vomiting, fever.  She was started on Tamiflu  by her PCP when she tested positive for influenza at that time.  Her initial symptoms began to improve over the next few days, however for the last 1 to 2 days, she has been experiencing gradually worsening shortness of breath and intermittently productive cough.  Due to this, she came to the ED today.   ED course: On arrival to the ED, patient was normotensive at 138/74 with heart rate of 90 respiratory rate of 20.  She was saturating at 95% on 6 L.  She was afebrile at 98.4.  Initial workup notable for normal CBC and unremarkable BMP.  Troponin, lactic acid and procalcitonin negative.  Influenza A PCR positive.  Chest x-ray was obtained with streaky bibasilar opacities.  Patient started on DuoNebs, Solu-Medrol , and IV fluids.  [facility] contacted for admission.   Assessment and Plan:   # Acute hypoxic respiratory failure due to influenza A viral infection and asthma exacerbation Shortness of breath got worse after developing A-fib with RVR Continue supplemental oxygen  to maintain oxygen  saturation above 88% Wean as tolerated to home 2 L at bedtime CT chest without contrast   # Acute Asthma exacerbation S/p Solu-Medrol  125 mg once Continue Solu-Medrol  40 mg  IV daily pending clinical improvement DuoNebs every 6 hours Pulmicort  twice daily Started Brovana  nebulizer twice daily Started Zyrtec  and Mucinex  DM as needed for cough Continue PPI for GERD 2/4 started Augmentin  twice daily for possible sinus infection as per patient's request   # Atrial fibrillation with RVR  Most likely due to hypoxia induced by influenza A and asthma exacerbation EKG: atrial fibrillation with RVR.  This is a new diagnosis, with history of only paroxysmal SVT.  Likely triggered by respiratory failure, influenza A, steroid use.   CHA2DS2-VASc of 5, started Eliquis  5 mg p.o. twice daily TSH 1.4 within normal range S/p Cardizem  IV infusion, started Cardizem  60 every 8 hourly, Toprol -XL 25 mg p.o. 3 times daily Continue to monitor on telemetry Cardiology consult appreciated TTE LVEF 55 to 60%, grade 1 diastolic dysfunction, no any other significant findings.   # Influenza A with pneumonia - Continue outpatient Tamiflu  - Supportive management with Mucinex  600 twice daily and DM as needed for cough   # (HFpEF) heart failure with preserved ejection fraction History of HFpEF with EF above 55% and G1DD. - Hold home as needed torsemide given hypovolemia - Daily weights - Strict in and out 2/4 Lasix  40 mg IV one-time dose given due to pulmonary congestion   # Coronary artery disease involving native coronary artery of native heart with angina pectoris Patient reporting chest tightness, however likely demand in the setting of severe RVR.  Low suspicion for ACS.  Initial troponin negative, however will recheck.  Initial EKG with no ischemic changes, with nonspecific  ischemic changes on repeat EKG, again likely due to demand. - Continue home aspirin , statin - Repeat troponin 38   # Benign essential HTN Started Cardizem , metoprolol  and losartan  25 mg p.o. daily as above BP improved, continue to monitor and titrate medications accordingly  # Constipation, resolved,  continue laxatives.   # Hypothyroid, continue Synthroid  50 mcg po  Daily    Body mass index is 28.08 kg/m.  Interventions:  Diet: Regular diet DVT Prophylaxis: Therapeutic Anticoagulation with Eliquis     Advance goals of care discussion: Full code  Family Communication: family was not present at bedside, at the time of interview.  The pt provided permission to discuss medical plan with the family. Opportunity was given to ask question and all questions were answered satisfactorily.   Disposition:  Pt is from Home, admitted with Resp Failure, A. Fib RVR still has SOB, which precludes a safe discharge. Discharge to SNF, when stable.  Subjective: No significant overnight events, cough and shortness of breath is improving, patient was complaining of some yellowish phlegm and pain in the sinuses, she is prone to develop sinus infection.  Dry tongue and possible oral thrush, which is negative.   Physical Exam: General: NAD, lying comfortably Appear in no distress, affect appropriate Eyes: PERRLA ENT: Oral Mucosa Clear, moist  Neck: no JVD,  Cardiovascular: S1 and S2 Present, no Murmur, irregular rhythm Respiratory: Equal air entry bilaterally, bilateral crackles and wheezes, slight improvement as compared to yesterday Abdomen: Bowel Sound present, Soft and no tenderness,  Skin: no rashes Extremities: no Pedal edema, no calf tenderness Neurologic: without any new focal findings Gait not checked due to patient safety concerns  Vitals:   09/14/23 0500 09/14/23 0630 09/14/23 0826 09/14/23 1335  BP:  (!) 141/67 124/65   Pulse:   87   Resp:   17   Temp:   (!) 97.5 F (36.4 C)   TempSrc:      SpO2:   97% 96%  Weight: 67.4 kg     Height:        Intake/Output Summary (Last 24 hours) at 09/14/2023 1547 Last data filed at 09/14/2023 1512 Gross per 24 hour  Intake 250 ml  Output 3825 ml  Net -3575 ml   Filed Weights   09/10/23 1341 09/13/23 0500 09/14/23 0500  Weight: 67.4 kg 68  kg 67.4 kg    Data Reviewed: I have personally reviewed and interpreted daily labs, tele strips, imagings as discussed above. I reviewed all nursing notes, pharmacy notes, vitals, pertinent old records I have discussed plan of care as described above with RN and patient/family.  CBC: Recent Labs  Lab 09/10/23 1343 09/11/23 0307 09/12/23 0508 09/13/23 0452 09/14/23 0524  WBC 5.8 2.9* 7.1 9.0 8.3  NEUTROABS  --  2.3  --   --   --   HGB 12.4 10.7* 10.5* 11.4* 12.3  HCT 38.4 32.6* 32.7* 34.1* 37.4  MCV 93.9 92.9 94.0 91.2 90.6  PLT 223 191 197 246 312   Basic Metabolic Panel: Recent Labs  Lab 09/10/23 1343 09/11/23 0306 09/11/23 0307 09/12/23 0508 09/13/23 0452 09/14/23 0524  NA 134*  --  138 137 140 136  K 3.9  --  3.5 3.5 3.4* 4.1  CL 96*  --  103 102 104 101  CO2 26  --  25 23 26 26   GLUCOSE 101*  --  179* 138* 120* 121*  BUN 13  --  11 12 13 12   CREATININE 0.67  --  0.54 0.59 0.55 0.52  CALCIUM  8.8*  --  8.3* 8.6* 8.6* 8.9  MG  --  2.1  --  2.2 2.1 2.4  PHOS  --  2.8  --  2.7 2.6 3.2    Studies: No results found.    Scheduled Meds:  amoxicillin -clavulanate  1 tablet Oral Q12H   apixaban   5 mg Oral BID   arformoterol   15 mcg Nebulization BID   aspirin  EC  81 mg Oral QHS   bisacodyl   5 mg Oral QHS   budesonide  (PULMICORT ) nebulizer solution  0.5 mg Nebulization Q12H   calcium -vitamin D   1 tablet Oral BID   cholecalciferol   1,000 Units Oral QHS   citalopram   20 mg Oral Daily   cyanocobalamin   500 mcg Oral Daily   diltiazem   180 mg Oral Daily   fenofibrate   160 mg Oral Daily   folic acid   500 mcg Oral Daily   guaiFENesin   600 mg Oral BID   ipratropium-albuterol   3 mL Nebulization Q6H   levothyroxine   50 mcg Oral Q0600   loratadine   10 mg Oral Daily   losartan   25 mg Oral Daily   methylPREDNISolone  (SOLU-MEDROL ) injection  40 mg Intravenous Daily   metoprolol  succinate  25 mg Oral Daily   oseltamivir   30 mg Oral BID   pantoprazole   40 mg Oral Daily    polyethylene glycol  17 g Oral Daily   raloxifene   60 mg Oral Daily   rosuvastatin   5 mg Oral Daily   sodium chloride  flush  3 mL Intravenous Q12H   Continuous Infusions:  promethazine  (PHENERGAN ) injection (IM or IVPB)     PRN Meds: acetaminophen , albuterol , benzonatate , bisacodyl , dextromethorphan -guaiFENesin  **AND** [COMPLETED] dextromethorphan -guaiFENesin , ketotifen , mouth rinse, promethazine  (PHENERGAN ) injection (IM or IVPB)  Time spent: 40 minutes  Author: ELVAN SOR. MD [facility] 09/14/2023 3:47 PM  To reach On-call, see care teams to locate the attending and reach out to them via [email or web address]. If 7PM-7AM, please contact night-coverage If you still have difficulty reaching the attending provider, please page the [facility] (Director on Call) for [facility] on amion for assistance.

## 2023-09-14 NOTE — Progress Notes (Signed)
 Occupational Therapy Treatment Patient Details Name: Vanessa Evans MRN: [id number] DOB: [birth year] Today's Date: 09/14/2023   History of present illness Pt admitted to [facility] on [facility]/10/23 for c/o worsening SOB with hypoxia that improved with supplemental O2. Dx with acute hypoxic respiratory failure secondary to the Flu. Significant PMH includes:  heart murmur, GERD, hypertension, hyperlipidemia, migraine headaches, hypothyroidism, asthma.   OT comments  Ms. Gavel was seen for OT treatment on this date. Upon arrival to room pt semi-fowlers in bed finishing up lunch, agreeable to OT Tx session. OT facilitated ADL management with education and assistance as described below. See ADL section for additional details regarding occupational performance. Pt continues to be functionally limited by cardiopulmonary status, decreased activity tolerance, and decreased balance. Pt return verbalizes understanding of education provided t/o session. Pt is progressing toward OT goals and continues to benefit from skilled OT services to maximize return to PLOF and minimize risk of future falls, injury, caregiver burden, and readmission. Will continue to follow POC as written. Discharge recommendation remains appropriate.        If plan is discharge home, recommend the following:  A little help with walking and/or transfers;A little help with bathing/dressing/bathroom;Assist for transportation   Equipment Recommendations  Other (comment) (defer to next venue of care)    Recommendations for Other Services      Precautions / Restrictions Precautions Precautions: Fall Restrictions Weight Bearing Restrictions Per Provider Order: No Other Position/Activity Restrictions: SpO2 >/= 92%       Mobility Bed Mobility Overal bed mobility: Needs Assistance Bed Mobility: Supine to Sit     Supine to sit: HOB elevated, Supervision          Transfers Overall transfer level: Needs assistance Equipment used:  Rolling walker (2 wheels) Transfers: Sit to/from Stand Sit to Stand: Contact guard assist, Supervision           General transfer comment: Able to progress to supervision for STS and to amb HH distance in room during session. Initial CGA for safety.     Balance Overall balance assessment: Needs assistance Sitting-balance support: Bilateral upper extremity supported, Feet supported Sitting balance-Leahy Scale: Good     Standing balance support: Reliant on assistive device for balance, During functional activity, Bilateral upper extremity supported, Single extremity supported Standing balance-Leahy Scale: Fair Standing balance comment: Pt able to briefly stand with 1 UE support. States she feels less steady than normal would not be comfortable standing without at least single UE support on RW.                           ADL either performed or assessed with clinical judgement   ADL Overall ADL's : Needs assistance/impaired Eating/Feeding: Set up;Sitting                   Lower Body Dressing: Sit to/from stand;Moderate assistance   Toilet Transfer: Contact guard assist;BSC/3in1;Rolling walker (2 wheels) Toilet Transfer Details (indicate cue type and reason): Simulated to recliner, pt declines need for commode during session. Benefits from energy conservation edu and therapeutic rest breaks t/o to maximize safety/independence. Toileting- Architect and Hygiene: Sit to/from stand;Set up;Supervision/safety Toileting - Clothing Manipulation Details (indicate cue type and reason): Anticipate based on functional need for toilet transfer.     Functional mobility during ADLs: Supervision/safety;Set up;Rolling walker (2 wheels) General ADL Comments: Increased time/effort to perform all functional activity during session. Benefits from frequent therapeutic rest breaks and cues for  PLB t/o session. SpO2 remains WFL >/=93% t/o with pt on 3L [place]. Pt eager to wean from  [place] as she does not use home O2 during the day. Educated on general recovery process for respiratory illness and importance of maintaining functional O2 sats to maximzie safety and independence during ADL management.    Extremity/Trunk Assessment              Vision Baseline Vision/History: 1 Wears glasses Ability to See in Adequate Light: 0 Adequate Patient Visual Report: No change from baseline     Perception     Praxis      Cognition Arousal: Alert Behavior During Therapy: WFL for tasks assessed/performed Overall Cognitive Status: Within Functional Limits for tasks assessed                                          Exercises Other Exercises Other Exercises: OT facilitated ADL management with edu and assist as described above. Pt return verbalizes understanding and continues to benefit from reinforcement. See ADL section for details.    Shoulder Instructions       General Comments      Pertinent Vitals/ Pain       Pain Assessment Pain Assessment: No/denies pain  Home Living                                          Prior Functioning/Environment              Frequency  Min 1X/week        Progress Toward Goals  OT Goals(current goals can now be found in the care plan section)  Progress towards OT goals: Progressing toward goals  Acute Rehab OT Goals OT Goal Formulation: With patient/family Time For Goal Achievement: 09/25/23 Potential to Achieve Goals: Good  Plan      Co-evaluation                 AM-PAC OT 6 Clicks Daily Activity     Outcome Measure   Help from another person eating meals?: None Help from another person taking care of personal grooming?: A Little Help from another person toileting, which includes using toliet, bedpan, or urinal?: A Little Help from another person bathing (including washing, rinsing, drying)?: A Little Help from another person to put on and taking off regular upper  body clothing?: A Little Help from another person to put on and taking off regular lower body clothing?: A Little 6 Click Score: 19    End of Session Equipment Utilized During Treatment: Gait belt;Rolling walker (2 wheels);Oxygen   OT Visit Diagnosis: Unsteadiness on feet (R26.81);Other abnormalities of gait and mobility (R26.89);Muscle weakness (generalized) (M62.81)   Activity Tolerance Patient tolerated treatment well   Patient Left in chair;with call bell/phone within reach;with chair alarm set   Nurse Communication Mobility status        Time: 8694-8666 OT Time Calculation (min): 28 min  Charges: OT General Charges $OT Visit: 1 Visit OT Treatments $Self Care/Home Management : 23-37 mins  Jhonny Pelton, M.S., OTR/L 09/14/23, 2:53 PM

## 2023-09-15 ENCOUNTER — Inpatient Hospital Stay: Payer: Medicare Other

## 2023-09-15 DIAGNOSIS — J9601 Acute respiratory failure with hypoxia: Secondary | ICD-10-CM | POA: Diagnosis not present

## 2023-09-15 LAB — CBC
HCT: 38 % (ref 36.0–46.0)
Hemoglobin: 12.9 g/dL (ref 12.0–15.0)
MCH: 30.3 pg (ref 26.0–34.0)
MCHC: 33.9 g/dL (ref 30.0–36.0)
MCV: 89.2 fL (ref 80.0–100.0)
Platelets: 399 10*3/uL (ref 150–400)
RBC: 4.26 MIL/uL (ref 3.87–5.11)
RDW: 12.7 % (ref 11.5–15.5)
WBC: 10.1 10*3/uL (ref 4.0–10.5)
nRBC: 0 % (ref 0.0–0.2)

## 2023-09-15 LAB — BASIC METABOLIC PANEL
Anion gap: 10 (ref 5–15)
BUN: 18 mg/dL (ref 8–23)
CO2: 27 mmol/L (ref 22–32)
Calcium: 9.2 mg/dL (ref 8.9–10.3)
Chloride: 96 mmol/L — ABNORMAL LOW (ref 98–111)
Creatinine, Ser: 0.63 mg/dL (ref 0.44–1.00)
GFR, Estimated: >60 mL/min (ref >60)
Glucose, Bld: 126 mg/dL — ABNORMAL HIGH (ref 70–99)
Potassium: 3.6 mmol/L (ref 3.5–5.1)
Sodium: 133 mmol/L — ABNORMAL LOW (ref 135–145)

## 2023-09-15 LAB — CULTURE, BLOOD (ROUTINE X 2)
Culture: NO GROWTH
Culture: NO GROWTH
Special Requests: ADEQUATE
Special Requests: ADEQUATE

## 2023-09-15 MED ORDER — NYSTATIN 100000 UNIT/ML MT SUSP
5.0000 mL | Freq: Four times a day (QID) | OROMUCOSAL | Status: DC
  Administered 2023-09-15 – 2023-09-17 (×7): 500000 [IU] via ORAL
  Filled 2023-09-15 (×9): qty 5

## 2023-09-15 MED ORDER — FUROSEMIDE 10 MG/ML IJ SOLN
40.0000 mg | Freq: Two times a day (BID) | INTRAMUSCULAR | Status: DC
  Administered 2023-09-15: 40 mg via INTRAVENOUS
  Filled 2023-09-15: qty 4

## 2023-09-15 NOTE — Progress Notes (Signed)
 Physical Therapy Treatment Patient Details Name: Vanessa Evans MRN: [id number] DOB: 11-Feb-1946 Today's Date: 09/15/2023   History of Present Illness Pt admitted to [facility] on 09/10/23 for c/o worsening SOB with hypoxia that improved with supplemental O2. Dx with acute hypoxic respiratory failure secondary to the Flu. Significant PMH includes:  heart murmur, GERD, hypertension, hyperlipidemia, migraine headaches, hypothyroidism, asthma.    PT Comments  Pt is making good progress increasing ability to maintain SPO2 >/=92% on 1LO2. Attempted RA, SPO2 dropped to 90% but rose back quickly to 92%. Put pt on 1 L O2 and SPO2 remained >/= 92% with activity, RN notified.  Pt is progressing with mobility demonstrating overall supervision level with bed mobility, transfers (with RW), and gait (with RW 40').  Continued PT will assist pt towards greater activity tolerance and safety with functional mobility.   If plan is discharge home, recommend the following: A little help with walking and/or transfers;A little help with bathing/dressing/bathroom;Assistance with cooking/housework;Assist for transportation;Help with stairs or ramp for entrance   Can travel by private vehicle        Equipment Recommendations  None recommended by PT    Recommendations for Other Services       Precautions / Restrictions Precautions Precautions: Fall Restrictions Weight Bearing Restrictions Per Provider Order: No Other Position/Activity Restrictions: SpO2 >/= 92%     Mobility  Bed Mobility Overal bed mobility: Needs Assistance Bed Mobility: Supine to Sit     Supine to sit: HOB elevated, Supervision     General bed mobility comments: less effort required today.  Attempted RA, SPO2 dropped to 90% but rose back quickly to 92%. Put pt on 1 L O2 and SPO2 remained >/= 92% with activity, RN notified.    Transfers Overall transfer level: Needs assistance Equipment used: Rolling walker (2 wheels) Transfers: Sit  to/from Stand, Bed to chair/wheelchair/BSC Sit to Stand: Supervision   Step pivot transfers: Supervision            Ambulation/Gait Ambulation/Gait assistance: Supervision Gait Distance (Feet): 40 Feet Assistive device: Rolling walker (2 wheels) Gait Pattern/deviations: WFL(Within Functional Limits), Decreased stride length           Stairs             Wheelchair Mobility     Tilt Bed    Modified Rankin (Stroke Patients Only)       Balance Overall balance assessment: Needs assistance Sitting-balance support: Bilateral upper extremity supported, Feet supported Sitting balance-Leahy Scale: Good     Standing balance support: Reliant on assistive device for balance, During functional activity, Bilateral upper extremity supported, Single extremity supported Standing balance-Leahy Scale: Fair                              Cognition Arousal: Alert Behavior During Therapy: WFL for tasks assessed/performed Overall Cognitive Status: Within Functional Limits for tasks assessed                                          Exercises      General Comments        Pertinent Vitals/Pain Pain Assessment Pain Assessment: No/denies pain    Home Living                          Prior Function  PT Goals (current goals can now be found in the care plan section) Acute Rehab PT Goals Patient Stated Goal: go home PT Goal Formulation: With patient/family Potential to Achieve Goals: Good Progress towards PT goals: Progressing toward goals    Frequency    Min 1X/week      PT Plan      Co-evaluation              AM-PAC PT 6 Clicks Mobility   Outcome Measure  Help needed turning from your back to your side while in a flat bed without using bedrails?: A Little Help needed moving from lying on your back to sitting on the side of a flat bed without using bedrails?: A Little Help needed moving to and  from a bed to a chair (including a wheelchair)?: A Little Help needed standing up from a chair using your arms (e.g., wheelchair or bedside chair)?: A Little Help needed to walk in hospital room?: A Little Help needed climbing 3-5 steps with a railing? : A Little 6 Click Score: 18    End of Session Equipment Utilized During Treatment: Gait belt;Oxygen  (1L) Activity Tolerance: Patient tolerated treatment well Patient left: in chair;with call bell/phone within reach;with family/visitor present Nurse Communication: Mobility status PT Visit Diagnosis: Unsteadiness on feet (R26.81);Muscle weakness (generalized) (M62.81);Pain Pain - Right/Left:  (no pain reported during PT session.)     Time: 1119-1140 PT Time Calculation (min) (ACUTE ONLY): 21 min  Charges:    $Therapeutic Activity: 8-22 mins PT General Charges $$ ACUTE PT VISIT: 1 Visit                     Harland Irving, PTA  09/15/23, 11:58 AM

## 2023-09-15 NOTE — TOC Progression Note (Signed)
 Transition of Care [facility]) - Progression Note    Patient Details  Name: Vanessa Evans MRN: [id number] Date of Birth: 02/21/1946  Transition of [facility]) CM/SW Contact  Royanne JINNY Bernheim, RN Phone Number: 09/15/2023, 3:32 PM  Clinical Narrative:      Met with the patient and the husband in the room, she reports that she will NOT go to rehab She has had bad experiences in the past with other family at rehab She has [employer] at home for night time and understands we will do the walk test to see if she qualifies for continuous, She wants a rollator and a 3 in1 also asked about a shower seat, I called to inquire about the shower seat and was told that Insurance usually does not cover it,  I notified  the patient I requested Rollator and 3 in 1 from [employer] to be delivered to the bedside  Expected Discharge Plan: Home w Home Health Services Barriers to Discharge: Continued Medical Work up  Expected Discharge Plan and Services   Discharge Planning Services: CM Consult   Living arrangements for the past 2 months: Single Family Home                 DME Arranged: Walker rolling with seat, Shower stool DME Agency: [employer] Date DME Agency Contacted: 09/15/23 Time DME Agency Contacted: 1530 Representative spoke with at DME Agency: [facility] HH Arranged: PT HH Agency: [facility] Date [facility] Agency Contacted: 09/13/23 Time HH Agency Contacted: 1500 Representative spoke with at [facility] Agency: [place]    Social Determinants of Health (SDOH) Interventions SDOH Screenings   Food Insecurity: No Food Insecurity (09/12/2023)  Housing: Low Risk  (09/12/2023)  Transportation Needs: No Transportation Needs (09/12/2023)  Utilities: Not At Risk (09/12/2023)  Alcohol  Screen: Low Risk  (07/12/2023)  Depression (PHQ2-9): Low Risk  (07/12/2023)  Social Connections: Unknown (09/12/2023)  Tobacco Use: Medium Risk (09/10/2023)    Readmission Risk Interventions    09/13/2023    3:14 PM  Readmission Risk  Prevention Plan  Transportation Screening Complete  PCP or Specialist Appt within 3-5 Days Complete  HRI or Home Care Consult Complete  Social Work Consult for Recovery Care Planning/Counseling Complete  Palliative Care Screening Not Applicable  Medication Review Oceanographer) Referral to Pharmacy

## 2023-09-15 NOTE — Progress Notes (Signed)
 [facility] Cardiology    SUBJECTIVE: Patient states she is gradually improved less shortness of breath no wheezing denies any palpitations or tachycardia slowly gaining her strength and appetite.  Patient feels like she is close to being well enough to go home   Vitals:   09/14/23 2300 09/15/23 0318 09/15/23 0452 09/15/23 0824  BP: 135/70   127/60  Pulse: 64   88  Resp: 18   18  Temp: 97.9 F (36.6 C)   98.2 F (36.8 C)  TempSrc: Oral   Axillary  SpO2: 91% 94%  100%  Weight:   67.6 kg   Height:         Intake/Output Summary (Last 24 hours) at 09/15/2023 1348 Last data filed at 09/15/2023 1123 Gross per 24 hour  Intake 240 ml  Output 3075 ml  Net -2835 ml      PHYSICAL EXAM  General: Well developed, well nourished, in no acute distress HEENT:  Normocephalic and atramatic Neck:  No JVD.  Lungs: Clear bilaterally to auscultation and percussion. Heart: HRRR . Normal S1 and S2 without gallops or murmurs.  Abdomen: Bowel sounds are positive, abdomen soft and non-tender  Msk:  Back normal, normal gait. Normal strength and tone for age. Extremities: No clubbing, cyanosis or edema.   Neuro: Alert and oriented X 3. Psych:  Good affect, responds appropriately   LABS: Basic Metabolic Panel: Recent Labs    09/13/23 0452 09/14/23 0524 09/15/23 0441  NA 140 136 133*  K 3.4* 4.1 3.6  CL 104 101 96*  CO2 26 26 27   GLUCOSE 120* 121* 126*  BUN 13 12 18   CREATININE 0.55 0.52 0.63  CALCIUM  8.6* 8.9 9.2  MG 2.1 2.4  --   PHOS 2.6 3.2  --    Liver Function Tests: No results for input(s): AST, ALT, ALKPHOS, BILITOT, PROT, ALBUMIN in the last 72 hours. No results for input(s): LIPASE, AMYLASE in the last 72 hours. CBC: Recent Labs    09/14/23 0524 09/15/23 0441  WBC 8.3 10.1  HGB 12.3 12.9  HCT 37.4 38.0  MCV 90.6 89.2  PLT 312 399   Cardiac Enzymes: No results for input(s): CKTOTAL, CKMB, CKMBINDEX, TROPONINI in the last 72 hours. BNP: Invalid  input(s): POCBNP D-Dimer: No results for input(s): DDIMER in the last 72 hours. Hemoglobin A1C: No results for input(s): HGBA1C in the last 72 hours. Fasting Lipid Panel: No results for input(s): CHOL, HDL, LDLCALC, TRIG, CHOLHDL, LDLDIRECT in the last 72 hours. Thyroid  Function Tests: No results for input(s): TSH, T4TOTAL, T3FREE, THYROIDAB in the last 72 hours.  Invalid input(s): FREET3 Anemia Panel: No results for input(s): VITAMINB12, FOLATE, FERRITIN, TIBC, IRON, RETICCTPCT in the last 72 hours.  DG Chest Port 1 View Result Date: 09/15/2023 CLINICAL DATA:  78 year old female with shortness of breath, body aches, fever and nausea vomiting. EXAM: PORTABLE CHEST 1 VIEW COMPARISON:  Chest CT 09/10/2023 and earlier. FINDINGS: Portable AP upright view at 0847 hours. Lung volumes have not significantly changed but bilateral ventilation appears significantly improved since 09/10/2023. There is mild residual left lung base curvilinear opacity. Normal cardiac size and mediastinal contours. Visualized tracheal air column is within normal limits. No pneumothorax, pulmonary edema, consolidation, pleural effusion. Right shoulder arthroplasty again noted. Paucity of bowel gas. No acute osseous abnormality identified. IMPRESSION: Improved bilateral lung ventilation since 09/10/2023. Mild residual left lung base probable atelectasis. No other cardiopulmonary abnormality. Electronically Signed   By: VEAR Hurst M.D.   On: 09/15/2023 09:26  Echo preserved left ventricular function EF of at least 55%  TELEMETRY: Sinus rhythm rate of 90 nonspecific ST-T wave changes:  ASSESSMENT AND PLAN:  Principal Problem:   Acute hypoxic respiratory failure (HCC) Active Problems:   Benign essential HTN   Coronary artery disease involving native coronary artery of native heart with angina pectoris (HCC)   Atrial fibrillation with RVR (HCC)   Influenza A with pneumonia   Asthma  exacerbation   (HFpEF) heart failure with preserved ejection fraction (HCC)    Plan Acute hypoxic respiratory failure secondary to influenza A with asthma exacerbation continue inhalers had been given Tamiflu  supplemental oxygen  point-of-care Acute asthma exacerbation status post steroid use nebulizers Pulmicort  allergy medications antibiotic therapy Atrial fibrillation rapid ventricular response probably related to hypoxemia and inflammation from influenza A patient on Eliquis  therapy significant CHADS2 score as well as rate control with Cardizem  metoprolol  will make long-term decisions as an outpatient about need for chronic anticoagulation Influenza A pneumonia received Tamiflu  supportive care with Mucinex  now with supplemental oxygen  inhalers seem to be improving Chronic diastolic congestive heart failure receiving low doses of Lasix  had been on torsemide at home seems to be doing somewhat better Coronary artery disease has had some chest tightness possibly related to breathing or respiratory problems negative troponins continue conservative management Patient seems close to being able to be discharged in improved have patient follow-up with cardiology as an outpatient   Cara JONETTA Lovelace, MD 09/15/2023 1:48 PM

## 2023-09-15 NOTE — Progress Notes (Signed)
 Patient is not able to walk the distance required to go the bathroom, or he/she is unable to safely negotiate stairs required to access the bathroom.  A 3in1 BSC will alleviate this problem

## 2023-09-15 NOTE — Progress Notes (Addendum)
 [facility] Progress Note  Patient: Vanessa Evans    FMW:[id number]  DOA: 09/10/2023     Date of Service: the patient was seen and examined on 09/15/2023  Chief Complaint  Patient presents with   Shortness of Breath   Brief hospital course:  Vanessa Evans is a 78 y.o. female with medical history significant of Mild intermittent asthma, hypothyroidism, iron deficiency anemia, nonobstructive CAD, hypertension, hyperlipidemia, paroxysmal SVT, HFpEF with G1 DD, who presents to the ED due to shortness of breath.   Vanessa Evans states that everyone in her household had recently contracted influenza.  Approximately 4 days ago, she started to feel generalized malaise with bodyaches, nausea, vomiting, fever.  She was started on Tamiflu  by her PCP when she tested positive for influenza at that time.  Her initial symptoms began to improve over the next few days, however for the last 1 to 2 days, she has been experiencing gradually worsening shortness of breath and intermittently productive cough.  Due to this, she came to the ED today.   ED course: On arrival to the ED, patient was normotensive at 138/74 with heart rate of 90 respiratory rate of 20.  She was saturating at 95% on 6 L.  She was afebrile at 98.4.  Initial workup notable for normal CBC and unremarkable BMP.  Troponin, lactic acid and procalcitonin negative.  Influenza A PCR positive.  Chest x-ray was obtained with streaky bibasilar opacities.  Patient started on DuoNebs, Solu-Medrol , and IV fluids.  TR[facility] contacted for admission.   Assessment and Plan:   # Acute hypoxic respiratory failure due to influenza A viral infection and asthma exacerbation Shortness of breath got worse after developing A-fib with RVR Continue supplemental oxygen  to maintain oxygen  saturation above 88% Wean as tolerated to home 2 L at bedtime CT chest w/o contrast: . Patchy airspace disease in the upper lungs, greater on the right, with consolidation or  atelectasis in the lung bases. This is likely multifocal pneumonia. 2. Fibrosis in the lung apices and bases.  Central bronchiectasis. 2/5 CXR: Improved bilateral lung ventilation since 09/10/2023. Mild residual left lung base probable atelectasis. No other cardiopulmonary abnormality.    # Acute Asthma exacerbation S/p Solu-Medrol  125 mg once Continue Solu-Medrol  40 mg IV daily pending clinical improvement DuoNebs every 6 hours Pulmicort  twice daily Started Brovana  nebulizer twice daily Started Zyrtec  and Mucinex  DM as needed for cough Continue PPI for GERD 2/4 started Augmentin  twice daily for possible sinus infection as per patient's request   # Atrial fibrillation with RVR  Most likely due to hypoxia induced by influenza A and asthma exacerbation EKG: atrial fibrillation with RVR.  This is a new diagnosis, with history of only paroxysmal SVT.  Likely triggered by respiratory failure, influenza A, steroid use.   CHA2DS2-VASc of 5, started Eliquis  5 mg p.o. twice daily TSH 1.4 within normal range S/p Cardizem  IV gtt, s/p Cardizem  60 TID-->180 po daily, Toprol -XL 25 mg p.o. daily Continue to monitor on telemetry Cardiology consult appreciated TTE LVEF 55 to 60%, grade 1 diastolic dysfunction, no any other significant findings.   # Influenza A with pneumonia - Continue outpatient Tamiflu  - Supportive management with Mucinex  600 twice daily and DM as needed for cough   # (HFpEF) heart failure with preserved ejection fraction History of HFpEF with EF above 55% and G1DD. - Hold home as needed torsemide given hypovolemia - Daily weights - Strict in and out 2/4 Lasix  40 mg IV one-time dose given  due to pulmonary congestion 2/5 Lasix  40 mg IV one-time dose given, x-ray looks fine, no need of more diuretics  # Coronary artery disease involving native coronary artery of native heart with angina pectoris Patient reporting chest tightness, however likely demand in the setting of severe  RVR.  Low suspicion for ACS.  Initial troponin negative, however will recheck.  Initial EKG with no ischemic changes, with nonspecific ischemic changes on repeat EKG, again likely due to demand. - Continue home aspirin , statin - Repeat troponin 38   # Benign essential HTN Started Cardizem , metoprolol  and losartan  25 mg p.o. daily as above BP improved, continue to monitor and titrate medications accordingly  # Constipation, resolved, continue laxatives.   # Hypothyroid, continue Synthroid  50 mcg po  Daily    Body mass index is 28.16 kg/m.  Interventions:  Diet: Regular diet DVT Prophylaxis: Therapeutic Anticoagulation with Eliquis     Advance goals of care discussion: Full code  Family Communication: family was not present at bedside, at the time of interview.  The pt provided permission to discuss medical plan with the family. Opportunity was given to ask question and all questions were answered satisfactorily.   Disposition:  Pt is from Home, admitted with Resp Failure, A. Fib RVR still has SOB, which precludes a safe discharge. Discharge to SNF vs Home w/HH, when stable.  Most likely in 1 to 2 days  Subjective: No significant overnight events, patient still has shortness of breath and cough, feels slight improvement.  Denies any chest pain or palpitation, no any other complaints.  Physical Exam: General: NAD, lying comfortably Appear in no distress, affect appropriate Eyes: PERRLA ENT: Oral Mucosa Clear, moist  Neck: no JVD,  Cardiovascular: S1 and S2 Present, no Murmur, irregular rhythm Respiratory: Equal air entry bilaterally, bilateral crackles and mild wheezes, slight improvement as compared to yesterday Abdomen: Bowel Sound present, Soft and no tenderness,  Skin: no rashes Extremities: no Pedal edema, no calf tenderness Neurologic: without any new focal findings Gait not checked due to patient safety concerns  Vitals:   09/14/23 2300 09/15/23 0318 09/15/23 0452  09/15/23 0824  BP: 135/70   127/60  Pulse: 64   88  Resp: 18   18  Temp: 97.9 F (36.6 C)   98.2 F (36.8 C)  TempSrc: Oral   Axillary  SpO2: 91% 94%  100%  Weight:   67.6 kg   Height:        Intake/Output Summary (Last 24 hours) at 09/15/2023 1356 Last data filed at 09/15/2023 1123 Gross per 24 hour  Intake 240 ml  Output 3075 ml  Net -2835 ml   Filed Weights   09/13/23 0500 09/14/23 0500 09/15/23 0452  Weight: 68 kg 67.4 kg 67.6 kg    Data Reviewed: I have personally reviewed and interpreted daily labs, tele strips, imagings as discussed above. I reviewed all nursing notes, pharmacy notes, vitals, pertinent old records I have discussed plan of care as described above with RN and patient/family.  CBC: Recent Labs  Lab 09/11/23 0307 09/12/23 0508 09/13/23 0452 09/14/23 0524 09/15/23 0441  WBC 2.9* 7.1 9.0 8.3 10.1  NEUTROABS 2.3  --   --   --   --   HGB 10.7* 10.5* 11.4* 12.3 12.9  HCT 32.6* 32.7* 34.1* 37.4 38.0  MCV 92.9 94.0 91.2 90.6 89.2  PLT 191 197 246 312 399   Basic Metabolic Panel: Recent Labs  Lab 09/11/23 0306 09/11/23 0307 09/12/23 0508 09/13/23 0452 09/14/23 0524  09/15/23 0441  NA  --  138 137 140 136 133*  K  --  3.5 3.5 3.4* 4.1 3.6  CL  --  103 102 104 101 96*  CO2  --  25 23 26 26 27   GLUCOSE  --  179* 138* 120* 121* 126*  BUN  --  11 12 13 12 18   CREATININE  --  0.54 0.59 0.55 0.52 0.63  CALCIUM   --  8.3* 8.6* 8.6* 8.9 9.2  MG 2.1  --  2.2 2.1 2.4  --   PHOS 2.8  --  2.7 2.6 3.2  --     Studies: DG Chest Port 1 View Result Date: 09/15/2023 CLINICAL DATA:  78 year old female with shortness of breath, body aches, fever and nausea vomiting. EXAM: PORTABLE CHEST 1 VIEW COMPARISON:  Chest CT 09/10/2023 and earlier. FINDINGS: Portable AP upright view at 0847 hours. Lung volumes have not significantly changed but bilateral ventilation appears significantly improved since 09/10/2023. There is mild residual left lung base curvilinear opacity.  Normal cardiac size and mediastinal contours. Visualized tracheal air column is within normal limits. No pneumothorax, pulmonary edema, consolidation, pleural effusion. Right shoulder arthroplasty again noted. Paucity of bowel gas. No acute osseous abnormality identified. IMPRESSION: Improved bilateral lung ventilation since 09/10/2023. Mild residual left lung base probable atelectasis. No other cardiopulmonary abnormality. Electronically Signed   By: VEAR Hurst M.D.   On: 09/15/2023 09:26      Scheduled Meds:  amoxicillin -clavulanate  1 tablet Oral Q12H   apixaban   5 mg Oral BID   arformoterol   15 mcg Nebulization BID   aspirin  EC  81 mg Oral QHS   bisacodyl   5 mg Oral QHS   budesonide  (PULMICORT ) nebulizer solution  0.5 mg Nebulization Q12H   calcium -vitamin D   1 tablet Oral BID   cholecalciferol   1,000 Units Oral QHS   citalopram   20 mg Oral Daily   cyanocobalamin   500 mcg Oral Daily   diltiazem   180 mg Oral Daily   fenofibrate   160 mg Oral Daily   folic acid   500 mcg Oral Daily   furosemide   40 mg Intravenous BID   guaiFENesin   600 mg Oral BID   ipratropium-albuterol   3 mL Nebulization Q6H   levothyroxine   50 mcg Oral Q0600   loratadine   10 mg Oral Daily   losartan   25 mg Oral Daily   methylPREDNISolone  (SOLU-MEDROL ) injection  40 mg Intravenous Daily   metoprolol  succinate  25 mg Oral Daily   oseltamivir   30 mg Oral BID   pantoprazole   40 mg Oral Daily   polyethylene glycol  17 g Oral Daily   raloxifene   60 mg Oral Daily   rosuvastatin   5 mg Oral Daily   sodium chloride  flush  3 mL Intravenous Q12H   Continuous Infusions:  promethazine  (PHENERGAN ) injection (IM or IVPB)     PRN Meds: acetaminophen , albuterol , benzonatate , bisacodyl , dextromethorphan -guaiFENesin  **AND** [COMPLETED] dextromethorphan -guaiFENesin , ketotifen , mouth rinse, promethazine  (PHENERGAN ) injection (IM or IVPB)  Time spent: 55 minutes  Author: ELVAN SOR. MD [facility] 09/15/2023 1:56 PM  To  reach On-call, see care teams to locate the attending and reach out to them via [email or web address]. If 7PM-7AM, please contact night-coverage If you still have difficulty reaching the attending provider, please page the [facility] (Director on Call) for [facility] on amion for assistance.

## 2023-09-16 ENCOUNTER — Ambulatory Visit: Payer: Medicare Other | Admitting: Physician Assistant

## 2023-09-16 ENCOUNTER — Other Ambulatory Visit: Payer: Self-pay | Admitting: *Deleted

## 2023-09-16 DIAGNOSIS — J9601 Acute respiratory failure with hypoxia: Secondary | ICD-10-CM | POA: Diagnosis not present

## 2023-09-16 LAB — CBC
HCT: 39.1 % (ref 36.0–46.0)
Hemoglobin: 13.3 g/dL (ref 12.0–15.0)
MCH: 29.9 pg (ref 26.0–34.0)
MCHC: 34 g/dL (ref 30.0–36.0)
MCV: 87.9 fL (ref 80.0–100.0)
Platelets: 470 K/uL — ABNORMAL HIGH (ref 150–400)
RBC: 4.45 MIL/uL (ref 3.87–5.11)
RDW: 12.3 % (ref 11.5–15.5)
WBC: 11.9 K/uL — ABNORMAL HIGH (ref 4.0–10.5)
nRBC: 0.2 % (ref 0.0–0.2)

## 2023-09-16 LAB — BASIC METABOLIC PANEL
Anion gap: 11 (ref 5–15)
BUN: 20 mg/dL (ref 8–23)
CO2: 25 mmol/L (ref 22–32)
Calcium: 8.9 mg/dL (ref 8.9–10.3)
Chloride: 95 mmol/L — ABNORMAL LOW (ref 98–111)
Creatinine, Ser: 0.59 mg/dL (ref 0.44–1.00)
GFR, Estimated: >60 mL/min (ref >60)
Glucose, Bld: 138 mg/dL — ABNORMAL HIGH (ref 70–99)
Potassium: 3.5 mmol/L (ref 3.5–5.1)
Sodium: 131 mmol/L — ABNORMAL LOW (ref 135–145)

## 2023-09-16 MED ORDER — IPRATROPIUM-ALBUTEROL 0.5-2.5 (3) MG/3ML IN SOLN: 3.0000 mL | Freq: Four times a day (QID) | RESPIRATORY_TRACT | Status: DC | PRN

## 2023-09-16 MED ORDER — PREDNISONE 10 MG PO TABS: 10.0000 mg | ORAL_TABLET | Freq: Every day | ORAL | Status: DC

## 2023-09-16 MED ORDER — PREDNISONE 20 MG PO TABS
30.0000 mg | ORAL_TABLET | Freq: Every day | ORAL | Status: DC
  Administered 2023-09-16 – 2023-09-17 (×2): 30 mg via ORAL
  Filled 2023-09-16 (×2): qty 1

## 2023-09-16 MED ORDER — METOPROLOL SUCCINATE ER 25 MG PO TB24
25.0000 mg | ORAL_TABLET | Freq: Two times a day (BID) | ORAL | Status: DC
  Administered 2023-09-16 – 2023-09-17 (×2): 25 mg via ORAL
  Filled 2023-09-16 (×2): qty 1

## 2023-09-16 MED ORDER — PREDNISONE 20 MG PO TABS: 20.0000 mg | ORAL_TABLET | Freq: Every day | ORAL | Status: DC

## 2023-09-16 MED ORDER — BUTALBITAL-APAP-CAFFEINE 50-325-40 MG PO TABS
1.0000 | ORAL_TABLET | Freq: Four times a day (QID) | ORAL | Status: DC | PRN
  Administered 2023-09-16 – 2023-09-17 (×2): 1 via ORAL
  Filled 2023-09-16 (×2): qty 1

## 2023-09-16 NOTE — Plan of Care (Signed)
  Problem: Clinical Measurements: Goal: Will remain free from infection Outcome: Progressing Goal: Diagnostic test results will improve Outcome: Progressing Goal: Respiratory complications will improve Outcome: Progressing   Problem: Activity: Goal: Risk for activity intolerance will decrease Outcome: Progressing   

## 2023-09-16 NOTE — Progress Notes (Addendum)
 Physical Therapy Treatment Patient Details Name: Vanessa Evans MRN: [id number] DOB: Jul 20, 1946 Today's Date: 09/16/2023   History of Present Illness Pt admitted to [facility] on 09/10/23 for c/o worsening SOB with hypoxia that improved with supplemental O2. Dx with acute hypoxic respiratory failure secondary to the Flu. Significant PMH includes:  heart murmur, GERD, hypertension, hyperlipidemia, migraine headaches, hypothyroidism, asthma.    PT Comments  Pt was long sitting in bed with supportive spouse at bedside. She was on 2 L o2 however placed on rm air throughout entire session with sao2 > 90%.  I usually only wear O2 at night. Pt tolerated getting OOB (HOB was elevated) and ambulating in room several times to door and back prior to needed seated rest. HR elevated from 101 bpm at rest up to 133 bpm during ambulation. I've always had a fast HR. Pt was repositioned in recliner post session with BLE elevated and call bell in reach. Pt is hopeful to DC directly home from acute hospital. Endorses needing new BSC due to hers being unsafe/old/her mother's. Recommend HHPT at DC.     If plan is discharge home, recommend the following: A little help with walking and/or transfers;A little help with bathing/dressing/bathroom;Assistance with cooking/housework;Assist for transportation;Help with stairs or ramp for entrance     Equipment Recommendations  BSC/3in1 (pt states her BSC at home was her moms and is really old)       Precautions / Restrictions Precautions Precautions: Fall Restrictions Weight Bearing Restrictions Per Provider Order: No     Mobility  Bed Mobility Overal bed mobility: Needs Assistance Bed Mobility: Supine to Sit  Supine to sit: HOB elevated, Supervision  General bed mobility comments: no physical assistance to exit R side of bed. increased time to perfrom. pt was placed on rm air throughout session with sao2 > 90%. I usually only wear oxygen  at night.     Transfers Overall transfer level: Needs assistance Equipment used: Rolling walker (2 wheels) Transfers: Sit to/from Stand Sit to Stand: Supervision  General transfer comment: no physical assistance to stand or sit with use of RW. pt wants rollator at DC.    Ambulation/Gait Ambulation/Gait assistance: Supervision Gait Distance (Feet): 80 Feet Assistive device: Rolling walker (2 wheels) Gait Pattern/deviations: Step-through pattern Gait velocity: decreased  General Gait Details: pt was able to ambulate several times in room on rm air without desaturation  below 90%. HR did quickly increase from 101bpm to 133bpm with activity. HR resolved after seated rest for ~ 2 minutes. Overall pt tolerated well but is limited by fatigue.    Balance Overall balance assessment: Needs assistance Sitting-balance support: Bilateral upper extremity supported, Feet supported Sitting balance-Leahy Scale: Good     Standing balance support: Reliant on assistive device for balance, During functional activity, Bilateral upper extremity supported, Single extremity supported Standing balance-Leahy Scale: Fair Standing balance comment: pt is reliant on BUE support for dynamic balance. No LOB but pt is highly encouraged to use at DC     Cognition Arousal: Alert Behavior During Therapy: [facility] for tasks assessed/performed Overall Cognitive Status: Within Functional Limits for tasks assessed    General Comments: pt is A and O x 4. supportive spouse present throughout session. Pt unwilling to go to rehab and states she is hopeful to go home tomorrow.               Pertinent Vitals/Pain Pain Assessment Pain Assessment: No/denies pain Pain Score: 0-No pain Pain Location: Does endorse having chronic LBP but did  not affect pt's abilities this date Pain Descriptors / Indicators: Dull, Discomfort, Burning, Squeezing Pain Intervention(s): Limited activity within patient's tolerance, Monitored during session,  Premedicated before session, Repositioned     PT Goals (current goals can now be found in the care plan section) Acute Rehab PT Goals Patient Stated Goal: go home tomorrow or saturday. Progress towards PT goals: Progressing toward goals    Frequency    Min 1X/week       AM-PAC PT 6 Clicks Mobility   Outcome Measure  Help needed turning from your back to your side while in a flat bed without using bedrails?: A Little Help needed moving from lying on your back to sitting on the side of a flat bed without using bedrails?: A Little Help needed moving to and from a bed to a chair (including a wheelchair)?: A Little Help needed standing up from a chair using your arms (e.g., wheelchair or bedside chair)?: A Little Help needed to walk in hospital room?: A Little Help needed climbing 3-5 steps with a railing? : A Little 6 Click Score: 18    End of Session   Activity Tolerance: Patient tolerated treatment well;Patient limited by fatigue Patient left: with call bell/phone within reach;in chair;with family/visitor present Nurse Communication: Mobility status PT Visit Diagnosis: Unsteadiness on feet (R26.81);Muscle weakness (generalized) (M62.81);Pain     Time: 8982-8962 PT Time Calculation (min) (ACUTE ONLY): 20 min  Charges:    $Gait Training: 8-22 mins PT General Charges $$ ACUTE PT VISIT: 1 Visit                    Rankin Essex PTA 09/16/23, 12:08 PM

## 2023-09-16 NOTE — Progress Notes (Signed)
 [facility] Progress Note  Patient: Vanessa Evans    FMW:[id number]  DOA: 09/10/2023     Date of Service: the patient was seen and examined on 09/16/2023  Chief Complaint  Patient presents with   Shortness of Breath   Brief hospital course:  Vanessa Evans is a 78 y.o. female with medical history significant of Mild intermittent asthma, hypothyroidism, iron deficiency anemia, nonobstructive CAD, hypertension, hyperlipidemia, paroxysmal SVT, HFpEF with G1 DD, who presents to the ED due to shortness of breath.   Vanessa Evans states that everyone in her household had recently contracted influenza.  Approximately 4 days ago, she started to feel generalized malaise with bodyaches, nausea, vomiting, fever.  She was started on Tamiflu  by her PCP when she tested positive for influenza at that time.  Her initial symptoms began to improve over the next few days, however for the last 1 to 2 days, she has been experiencing gradually worsening shortness of breath and intermittently productive cough.  Due to this, she came to the ED today.   ED course: On arrival to the ED, patient was normotensive at 138/74 with heart rate of 90 respiratory rate of 20.  She was saturating at 95% on 6 L.  She was afebrile at 98.4.  Initial workup notable for normal CBC and unremarkable BMP.  Troponin, lactic acid and procalcitonin negative.  Influenza A PCR positive.  Chest x-ray was obtained with streaky bibasilar opacities.  Patient started on DuoNebs, Solu-Medrol , and IV fluids.  [facility] contacted for admission.   Assessment and Plan:   # Acute hypoxic respiratory failure due to influenza A viral infection and asthma exacerbation Shortness of breath got worse after developing A-fib with RVR Continue supplemental oxygen  to maintain oxygen  saturation above 88% Wean as tolerated to home 2 L at bedtime CT chest w/o contrast: . Patchy airspace disease in the upper lungs, greater on the right, with consolidation or  atelectasis in the lung bases. This is likely multifocal pneumonia. 2. Fibrosis in the lung apices and bases.  Central bronchiectasis. 2/5 CXR: Improved bilateral lung ventilation since 09/10/2023. Mild residual left lung base probable atelectasis. No other cardiopulmonary abnormality.    # Acute Asthma exacerbation S/p Solu-Medrol  125 mg once S/p Solu-Medrol  40 mg IV daily, 2/6 started prednisone  30 mg p.o. daily x 3 days, 20 mg daily for 3 days and 10 mg daily for 3 days DuoNebs every 6 hours Pulmicort  twice daily Continue Brovana  nebulizer twice daily Continue Zyrtec  and Mucinex  DM as needed for cough Continue PPI for GERD 2/4 started Augmentin  twice daily for possible sinus infection as per patient's request 2/6 started prednisone  taper as above  # Atrial fibrillation with RVR  Most likely due to hypoxia induced by influenza A and asthma exacerbation EKG: atrial fibrillation with RVR.  This is a new diagnosis, with history of only paroxysmal SVT.  Likely triggered by respiratory failure, influenza A, steroid use.   CHA2DS2-VASc of 5, started Eliquis  5 mg p.o. twice daily TSH 1.4 within normal range S/p Cardizem  IV gtt, s/p Cardizem  60 TID-->180 po daily, Toprol -XL 25 mg p.o. daily 2/6 increased Toprol -XL 25 mg p.o. twice daily due to persistent tachycardia Continue to monitor on telemetry Cardiology consult appreciated TTE LVEF 55 to 60%, grade 1 diastolic dysfunction, no any other significant findings.   # Influenza A with pneumonia - Continue outpatient Tamiflu  - Supportive management with Mucinex  600 twice daily and DM as needed for cough   # (HFpEF) heart failure  with preserved ejection fraction History of HFpEF with EF above 55% and G1DD. - Hold home as needed torsemide given hypovolemia - Daily weights - Strict in and out 2/4 Lasix  40 mg IV one-time dose given due to pulmonary congestion 2/5 Lasix  40 mg IV one-time dose given, x-ray looks fine, no need of more  diuretics  # Coronary artery disease involving native coronary artery of native heart with angina pectoris Patient reporting chest tightness, however likely demand in the setting of severe RVR.  Low suspicion for ACS.  Initial troponin negative, however will recheck.  Initial EKG with no ischemic changes, with nonspecific ischemic changes on repeat EKG, again likely due to demand. - Continue home aspirin , statin - Repeat troponin 38   # Benign essential HTN Started Cardizem , metoprolol  and losartan  25 mg p.o. daily as above BP improved, continue to monitor and titrate medications accordingly  # Hyponatremia, monitor sodium level daily Na 131 # Constipation, resolved, continue laxatives.   # Hypothyroid, continue Synthroid  50 mcg po  Daily    Body mass index is 27.62 kg/m.  Interventions:  Diet: Regular diet DVT Prophylaxis: Therapeutic Anticoagulation with Eliquis     Advance goals of care discussion: Full code  Family Communication: family was not present at bedside, at the time of interview.  The pt provided permission to discuss medical plan with the family. Opportunity was given to ask question and all questions were answered satisfactorily.   Disposition:  Pt is from Home, admitted with Resp Failure, A. Fib RVR still has SOB, which precludes a safe discharge. Discharge to SNF vs Home w/HH, when stable.  Most likely in 1 to 2 days  Subjective: No significant overnight events, patient still has shortness of breath, coughing feels congestion, overall she feels improvement, still on 2 L oxygen  via [place]. Agreed to start tapering prednisone .   Physical Exam: General: NAD, lying comfortably Appear in no distress, affect appropriate Eyes: PERRLA ENT: Oral Mucosa Clear, moist  Neck: no JVD,  Cardiovascular: S1 and S2 Present, no Murmur, irregular rhythm Respiratory: Equal air entry bilaterally, mild bilateral crackles and no wheezes, gradually improving Abdomen: Bowel Sound  present, Soft and no tenderness,  Skin: no rashes Extremities: no Pedal edema, no calf tenderness Neurologic: without any new focal findings Gait not checked due to patient safety concerns  Vitals:   09/16/23 0300 09/16/23 0500 09/16/23 0841 09/16/23 0845  BP:    107/65  Pulse:    95  Resp:    16  Temp:    97.6 F (36.4 C)  TempSrc:      SpO2: 94%  97% 100%  Weight:  66.3 kg    Height:        Intake/Output Summary (Last 24 hours) at 09/16/2023 1055 Last data filed at 09/16/2023 0600 Gross per 24 hour  Intake 360 ml  Output 2700 ml  Net -2340 ml   Filed Weights   09/14/23 0500 09/15/23 0452 09/16/23 0500  Weight: 67.4 kg 67.6 kg 66.3 kg    Data Reviewed: I have personally reviewed and interpreted daily labs, tele strips, imagings as discussed above. I reviewed all nursing notes, pharmacy notes, vitals, pertinent old records I have discussed plan of care as described above with RN and patient/family.  CBC: Recent Labs  Lab 09/11/23 0307 09/12/23 0508 09/13/23 0452 09/14/23 0524 09/15/23 0441 09/16/23 0418  WBC 2.9* 7.1 9.0 8.3 10.1 11.9*  NEUTROABS 2.3  --   --   --   --   --  HGB 10.7* 10.5* 11.4* 12.3 12.9 13.3  HCT 32.6* 32.7* 34.1* 37.4 38.0 39.1  MCV 92.9 94.0 91.2 90.6 89.2 87.9  PLT 191 197 246 312 399 470*   Basic Metabolic Panel: Recent Labs  Lab 09/11/23 0306 09/11/23 0307 09/12/23 0508 09/13/23 0452 09/14/23 0524 09/15/23 0441 09/16/23 0418  NA  --    < > 137 140 136 133* 131*  K  --    < > 3.5 3.4* 4.1 3.6 3.5  CL  --    < > 102 104 101 96* 95*  CO2  --    < > 23 26 26 27 25   GLUCOSE  --    < > 138* 120* 121* 126* 138*  BUN  --    < > 12 13 12 18 20   CREATININE  --    < > 0.59 0.55 0.52 0.63 0.59  CALCIUM   --    < > 8.6* 8.6* 8.9 9.2 8.9  MG 2.1  --  2.2 2.1 2.4  --   --   PHOS 2.8  --  2.7 2.6 3.2  --   --    < > = values in this interval not displayed.    Studies: No results found.     Scheduled Meds:  amoxicillin -clavulanate   1 tablet Oral Q12H   apixaban   5 mg Oral BID   arformoterol   15 mcg Nebulization BID   aspirin  EC  81 mg Oral QHS   bisacodyl   5 mg Oral QHS   budesonide  (PULMICORT ) nebulizer solution  0.5 mg Nebulization Q12H   calcium -vitamin D   1 tablet Oral BID   cholecalciferol   1,000 Units Oral QHS   citalopram   20 mg Oral Daily   cyanocobalamin   500 mcg Oral Daily   diltiazem   180 mg Oral Daily   fenofibrate   160 mg Oral Daily   folic acid   500 mcg Oral Daily   guaiFENesin   600 mg Oral BID   levothyroxine   50 mcg Oral Q0600   loratadine   10 mg Oral Daily   losartan   25 mg Oral Daily   metoprolol  succinate  25 mg Oral BID   nystatin   5 mL Oral QID   pantoprazole   40 mg Oral Daily   polyethylene glycol  17 g Oral Daily   predniSONE   30 mg Oral Q breakfast   Followed by   NOREEN ON 09/19/2023] predniSONE   20 mg Oral Q breakfast   Followed by   NOREEN ON 09/22/2023] predniSONE   10 mg Oral Q breakfast   raloxifene   60 mg Oral Daily   rosuvastatin   5 mg Oral Daily   sodium chloride  flush  3 mL Intravenous Q12H   Continuous Infusions:  promethazine  (PHENERGAN ) injection (IM or IVPB)     PRN Meds: acetaminophen , benzonatate , bisacodyl , dextromethorphan -guaiFENesin  **AND** [COMPLETED] dextromethorphan -guaiFENesin , ipratropium-albuterol , ketotifen , mouth rinse, promethazine  (PHENERGAN ) injection (IM or IVPB)  Time spent: 55 minutes  Author: ELVAN SOR. MD [facility] 09/16/2023 10:55 AM  To reach On-call, see care teams to locate the attending and reach out to them via [email or web address]. If 7PM-7AM, please contact night-coverage If you still have difficulty reaching the attending provider, please page the [facility] (Director on Call) for [facility] on amion for assistance.

## 2023-09-16 NOTE — Progress Notes (Signed)
 [facility] CLINIC CARDIOLOGY PROGRESS NOTE   Patient ID: Vanessa Evans MRN: [id number] DOB/AGE: November 11, 1945 78 y.o.  Admit date: 09/10/2023 Referring Physician Dr. Elvan Sor Primary Physician McDonough, Tinnie POUR, PA-C  Primary Cardiologist Dr. Florencio Reason for Consultation AF RVR  HPI: Vanessa Evans is a 78 y.o. female with a past medical history of HFpEF, paroxysmal SVT, nonobstructive coronary artery disease who presented to the ED on 09/10/2023 for shortness of breath. Flu A positive. Found to be in AF RVR. Cardiology was consulted for further evaluation.   Interval History:  -Patient seen and examined this AM, reports she is feeling better overall.  -SOB has improved, denies any CP.  -BP and HR remain stable. She is in NSR on tele.   Review of systems complete and found to be negative unless listed above    Vitals:   09/16/23 0300 09/16/23 0500 09/16/23 0841 09/16/23 0845  BP:    107/65  Pulse:    95  Resp:    16  Temp:    97.6 F (36.4 C)  TempSrc:      SpO2: 94%  97% 100%  Weight:  66.3 kg    Height:         Intake/Output Summary (Last 24 hours) at 09/16/2023 1303 Last data filed at 09/16/2023 0600 Gross per 24 hour  Intake --  Output 2300 ml  Net -2300 ml     PHYSICAL EXAM General: Chronically ill appearing female, well nourished, in no acute distress. HEENT: Normocephalic and atraumatic. Neck: No JVD.  Lungs: Normal respiratory effort on 1L [place]. Clear bilaterally to auscultation. No wheezes, crackles, rhonchi.  Heart: HRRR. Normal S1 and S2 without gallops or murmurs. Radial & DP pulses 2+ bilaterally. Abdomen: Non-distended appearing.  Msk: Normal strength and tone for age. Extremities: No clubbing, cyanosis or edema.   Neuro: Alert and oriented X 3. Psych: Mood appropriate, affect congruent.    LABS: Basic Metabolic Panel: Recent Labs    09/14/23 0524 09/15/23 0441 09/16/23 0418  NA 136 133* 131*  K 4.1 3.6 3.5  CL 101 96* 95*  CO2 26  27 25   GLUCOSE 121* 126* 138*  BUN 12 18 20   CREATININE 0.52 0.63 0.59  CALCIUM  8.9 9.2 8.9  MG 2.4  --   --   PHOS 3.2  --   --    Liver Function Tests: No results for input(s): AST, ALT, ALKPHOS, BILITOT, PROT, ALBUMIN in the last 72 hours. No results for input(s): LIPASE, AMYLASE in the last 72 hours. CBC: Recent Labs    09/15/23 0441 09/16/23 0418  WBC 10.1 11.9*  HGB 12.9 13.3  HCT 38.0 39.1  MCV 89.2 87.9  PLT 399 470*   Cardiac Enzymes: No results for input(s): CKTOTAL, CKMB, CKMBINDEX, TROPONINIHS in the last 72 hours. BNP: No results for input(s): BNP in the last 72 hours. D-Dimer: No results for input(s): DDIMER in the last 72 hours. Hemoglobin A1C: No results for input(s): HGBA1C in the last 72 hours. Fasting Lipid Panel: No results for input(s): CHOL, HDL, LDLCALC, TRIG, CHOLHDL, LDLDIRECT in the last 72 hours. Thyroid  Function Tests: No results for input(s): TSH, T4TOTAL, T3FREE, THYROIDAB in the last 72 hours.  Invalid input(s): FREET3 Anemia Panel: No results for input(s): VITAMINB12, FOLATE, FERRITIN, TIBC, IRON, RETICCTPCT in the last 72 hours.  DG Chest Port 1 View Result Date: 09/15/2023 CLINICAL DATA:  78 year old female with shortness of breath, body aches, fever and nausea vomiting. EXAM: PORTABLE CHEST 1  VIEW COMPARISON:  Chest CT 09/10/2023 and earlier. FINDINGS: Portable AP upright view at 0847 hours. Lung volumes have not significantly changed but bilateral ventilation appears significantly improved since 09/10/2023. There is mild residual left lung base curvilinear opacity. Normal cardiac size and mediastinal contours. Visualized tracheal air column is within normal limits. No pneumothorax, pulmonary edema, consolidation, pleural effusion. Right shoulder arthroplasty again noted. Paucity of bowel gas. No acute osseous abnormality identified. IMPRESSION: Improved bilateral lung ventilation  since 09/10/2023. Mild residual left lung base probable atelectasis. No other cardiopulmonary abnormality. Electronically Signed   By: VEAR Hurst M.D.   On: 09/15/2023 09:26     ECHO 09/12/2023: 1. Left ventricular ejection fraction, by estimation, is 55 to 60%. The left ventricle has normal function. The left ventricle has no regional wall motion abnormalities. There is mild concentric left ventricular hypertrophy. Left ventricular diastolic parameters are consistent with Grade I diastolic dysfunction (impaired relaxation).   2. Right ventricular systolic function is normal. The right ventricular  size is normal.   3. The mitral valve is normal in structure. Trivial mitral valve regurgitation.   4. The aortic valve is normal in structure. Aortic valve regurgitation is  not visualized.   TELEMETRY reviewed by me 09/16/23: sinus rhythm rate 80s  EKG reviewed by me 09/16/23: AF RVR 169 bpm  DATA reviewed by me 09/16/23: last 24h vitals tele labs imaging I/O, hospitalist progress note  Principal Problem:   Acute hypoxic respiratory failure (HCC) Active Problems:   Benign essential HTN   Coronary artery disease involving native coronary artery of native heart with angina pectoris (HCC)   Atrial fibrillation with RVR (HCC)   Influenza A with pneumonia   Asthma exacerbation   (HFpEF) heart failure with preserved ejection fraction (HCC)    ASSESSMENT AND PLAN: Vanessa Evans is a 78 y.o. female with a past medical history of HFpEF, paroxysmal SVT, nonobstructive coronary artery disease who presented to the ED on 09/10/2023 for shortness of breath. Flu A positive. Found to be in AF RVR. Cardiology was consulted for further evaluation.   # Atrial fibrillation RVR # New onset atrial fibrillation Patient with recent flu A infection presenting with SOB and hypoxic respiratory failure found to be in new onset AF RVR. Initially on IV diltiazem  now transitioned to PO. Converted to NSR. Echo this  admission with EF 55-60%, grade I diastolic dysfunction.  -Continue eliquis  5 mg twice daily.  -Continue diltiazem  180 mg daily and metoprolol  succinate 25 mg twice daily for rate control and maintenance of sinus rhythm.  # Acute hypoxic respiratory failure # Influenza A infection Patient recently diagnosed with flu A and presenting with worsening SOB requiring supplemental O2.  -Further management per primary.  Cardiology will sign off. Please haiku with questions or re-engage if needed.    This patient's case was discussed and created with Dr. Florencio and he is in agreement.  Signed:  Danita Bloch, PA-C  09/16/2023, 1:03 PM [facility] Cardiology

## 2023-09-16 NOTE — Consult Note (Signed)
 [facility] Liaison Note  09/16/2023  [name] 04-Feb-1946 [id number]  Location: RN Hospital Liaison met patient at bedside at [facility].   Insurance: Medicare   Vanessa Evans is a 78 y.o. female who is a Primary Care Patient of McDonough, Tinnie POUR, PA-C [employer]. The patient was screened for readmission hospitalization with noted High risk score for unplanned readmission risk with 1 IP in 6 months.  The patient was assessed for potential Care Management service needs for post hospital transition for care coordination. Review of patient's electronic medical record reveals patient was admitted with Acute Hypoxia respiratory failure. Recommendation noted for [facility]. No family at bedside. Liaison reached out to the spouse and educated on VBCI services and offered a post hospital prevention readmission follow up calls (receptive and agreed). Liaison will make a referral for nurse care coordinator to follow up accordingly. Note this provider provides the transition of care follow up calls and appointments.   Plan: [facility] Liaison will continue to follow progress and disposition to asess for post hospital community care coordination/management needs.  Referral request for community care coordination: Liaison  will make a referral for a nurse care coordinator to follow up.    VBCI Care Management/Population Health does not replace or interfere with any arrangements made by the Inpatient Transition of Care team.   For questions contact:   Olam Ku, RN, [facility] Liaison [place]   [facility], Population Health Office Hours MTWF  8:00 am-6:00 pm Direct Dial: [phone] mobile [phone] [Office toll free line] Office Hours are M-F 8:30 - 5 pm [email or web address]

## 2023-09-17 ENCOUNTER — Inpatient Hospital Stay: Payer: Medicare Other

## 2023-09-17 ENCOUNTER — Other Ambulatory Visit: Payer: Self-pay

## 2023-09-17 ENCOUNTER — Encounter: Payer: Self-pay | Admitting: Oncology

## 2023-09-17 DIAGNOSIS — J9601 Acute respiratory failure with hypoxia: Secondary | ICD-10-CM | POA: Diagnosis not present

## 2023-09-17 LAB — BASIC METABOLIC PANEL
Anion gap: 10 (ref 5–15)
BUN: 19 mg/dL (ref 8–23)
CO2: 26 mmol/L (ref 22–32)
Calcium: 8.6 mg/dL — ABNORMAL LOW (ref 8.9–10.3)
Chloride: 98 mmol/L (ref 98–111)
Creatinine, Ser: 0.61 mg/dL (ref 0.44–1.00)
GFR, Estimated: >60 mL/min (ref >60)
Glucose, Bld: 95 mg/dL (ref 70–99)
Potassium: 3.7 mmol/L (ref 3.5–5.1)
Sodium: 134 mmol/L — ABNORMAL LOW (ref 135–145)

## 2023-09-17 LAB — CBC
HCT: 39 % (ref 36.0–46.0)
Hemoglobin: 13.2 g/dL (ref 12.0–15.0)
MCH: 30.4 pg (ref 26.0–34.0)
MCHC: 33.8 g/dL (ref 30.0–36.0)
MCV: 89.9 fL (ref 80.0–100.0)
Platelets: 484 10*3/uL — ABNORMAL HIGH (ref 150–400)
RBC: 4.34 MIL/uL (ref 3.87–5.11)
RDW: 12.2 % (ref 11.5–15.5)
WBC: 9.4 10*3/uL (ref 4.0–10.5)
nRBC: 0 % (ref 0.0–0.2)

## 2023-09-17 MED ORDER — POLYETHYLENE GLYCOL 3350 17 GM/SCOOP PO POWD
17.0000 g | Freq: Every day | ORAL | 0 refills | Status: AC
  Filled 2023-09-17: qty 238, 14d supply, fill #0

## 2023-09-17 MED ORDER — NYSTATIN 100000 UNIT/ML MT SUSP
5.0000 mL | Freq: Four times a day (QID) | OROMUCOSAL | 0 refills | Status: DC
  Filled 2023-09-17: qty 60, 3d supply, fill #0

## 2023-09-17 MED ORDER — BISACODYL 5 MG PO TBEC
5.0000 mg | DELAYED_RELEASE_TABLET | Freq: Every evening | ORAL | 0 refills | Status: AC | PRN
  Filled 2023-09-17: qty 30, 30d supply, fill #0

## 2023-09-17 MED ORDER — METOPROLOL SUCCINATE ER 25 MG PO TB24
25.0000 mg | ORAL_TABLET | Freq: Two times a day (BID) | ORAL | 11 refills | Status: AC
  Filled 2023-09-17: qty 60, 30d supply, fill #0

## 2023-09-17 MED ORDER — DILTIAZEM HCL ER COATED BEADS 180 MG PO CP24
180.0000 mg | ORAL_CAPSULE | Freq: Every day | ORAL | 11 refills | Status: DC
  Filled 2023-09-17: qty 30, 30d supply, fill #0

## 2023-09-17 MED ORDER — PREDNISONE 10 MG PO TABS
ORAL_TABLET | ORAL | 0 refills | Status: DC
  Filled 2023-09-17: qty 12, 7d supply, fill #0

## 2023-09-17 MED ORDER — AMOXICILLIN-POT CLAVULANATE 875-125 MG PO TABS
1.0000 | ORAL_TABLET | Freq: Two times a day (BID) | ORAL | 0 refills | Status: AC
  Filled 2023-09-17: qty 8, 4d supply, fill #0

## 2023-09-17 MED ORDER — FLUTICASONE FUROATE-VILANTEROL 100-25 MCG/ACT IN AEPB
1.0000 | INHALATION_SPRAY | Freq: Every day | RESPIRATORY_TRACT | 1 refills | Status: DC
  Filled 2023-09-17: qty 60, 30d supply, fill #0

## 2023-09-17 MED ORDER — APIXABAN 5 MG PO TABS
5.0000 mg | ORAL_TABLET | Freq: Two times a day (BID) | ORAL | 11 refills | Status: DC
  Filled 2023-09-17: qty 60, 30d supply, fill #0

## 2023-09-17 NOTE — Plan of Care (Signed)
  Problem: Education: Goal: Knowledge of General Education information will improve Description: Including pain rating scale, medication(s)/side effects and non-pharmacologic comfort measures Outcome: Adequate for Discharge   Problem: Health Behavior/Discharge Planning: Goal: Ability to manage health-related needs will improve Outcome: Adequate for Discharge   Problem: Clinical Measurements: Goal: Ability to maintain clinical measurements within normal limits will improve Outcome: Adequate for Discharge Goal: Will remain free from infection Outcome: Adequate for Discharge Goal: Diagnostic test results will improve Outcome: Adequate for Discharge Goal: Respiratory complications will improve Outcome: Adequate for Discharge Goal: Cardiovascular complication will be avoided Outcome: Adequate for Discharge   Problem: Activity: Goal: Risk for activity intolerance will decrease Outcome: Adequate for Discharge   Problem: Nutrition: Goal: Adequate nutrition will be maintained Outcome: Adequate for Discharge   Problem: Coping: Goal: Level of anxiety will decrease Outcome: Adequate for Discharge   Problem: Elimination: Goal: Will not experience complications related to bowel motility Outcome: Adequate for Discharge Goal: Will not experience complications related to urinary retention Outcome: Adequate for Discharge   Problem: Pain Managment: Goal: General experience of comfort will improve and/or be controlled Outcome: Adequate for Discharge   Problem: Safety: Goal: Ability to remain free from injury will improve Outcome: Adequate for Discharge   Problem: Skin Integrity: Goal: Risk for impaired skin integrity will decrease Outcome: Adequate for Discharge   Problem: Education: Goal: Knowledge of disease or condition will improve Outcome: Adequate for Discharge Goal: Knowledge of the prescribed therapeutic regimen will improve Outcome: Adequate for Discharge Goal:  Individualized Educational Video(s) Outcome: Adequate for Discharge   Problem: Activity: Goal: Ability to tolerate increased activity will improve Outcome: Adequate for Discharge Goal: Will verbalize the importance of balancing activity with adequate rest periods Outcome: Adequate for Discharge   Problem: Respiratory: Goal: Ability to maintain a clear airway will improve Outcome: Adequate for Discharge Goal: Levels of oxygenation will improve Outcome: Adequate for Discharge Goal: Ability to maintain adequate ventilation will improve Outcome: Adequate for Discharge   Problem: Education: Goal: Knowledge of disease or condition will improve Outcome: Adequate for Discharge Goal: Understanding of medication regimen will improve Outcome: Adequate for Discharge Goal: Individualized Educational Video(s) Outcome: Adequate for Discharge   Problem: Activity: Goal: Ability to tolerate increased activity will improve Outcome: Adequate for Discharge   Problem: Cardiac: Goal: Ability to achieve and maintain adequate cardiopulmonary perfusion will improve Outcome: Adequate for Discharge   Problem: Health Behavior/Discharge Planning: Goal: Ability to safely manage health-related needs after discharge will improve Outcome: Adequate for Discharge

## 2023-09-17 NOTE — Care Management Important Message (Signed)
 Important Message  Patient Details  Name: Vanessa Evans MRN: [id number] Date of Birth: 14-Sep-1945   Important Message Given:  Yes - Medicare IM     Felix Host 09/17/2023, 11:55 AM

## 2023-09-17 NOTE — Discharge Summary (Signed)
 [facility] Discharge Summary   Patient: Vanessa Evans FMW:[id number]  PCP: Kristina Tinnie POUR, PA-C  Date of admission: 09/10/2023   Date of discharge:  09/17/2023     Discharge Diagnoses:  Principal Problem:   Acute hypoxic respiratory failure (HCC) Active Problems:   Atrial fibrillation with RVR (HCC)   Asthma exacerbation   Influenza A with pneumonia   (HFpEF) heart failure with preserved ejection fraction (HCC)   Coronary artery disease involving native coronary artery of native heart with angina pectoris (HCC)   Benign essential HTN   Admitted From: Home Disposition:  Home with [facility]  Recommendations for Outpatient Follow-up:  Follow-up with PCP in 1 week, monitor BP and heart rate at home, follow with PCP to titrate medication accordingly Follow with cardiology in 1 to 2 weeks Follow up LABS/TEST:     Follow-up Information     Callwood, Cara D, MD. Go in 1 week(s).   Specialties: Cardiology, Internal Medicine Contact information: [address] [place] [address] [phone]         Kristina Tinnie POUR, PA-C Follow up in 1 week(s).   Specialty: Physician Assistant Contact information: [address] [place] [address] [phone]                Diet recommendation: Cardiac diet  Activity: The patient is advised to gradually reintroduce usual activities, as tolerated  Discharge Condition: stable  Code Status: Full code   History of present illness: As per the H and P dictated on admission. Hospital Course:  Vanessa Evans is a 78 y.o. female with medical history significant of Mild intermittent asthma, hypothyroidism, iron deficiency anemia, nonobstructive CAD, hypertension, hyperlipidemia, paroxysmal SVT, HFpEF with G1 DD, who presents to the ED due to shortness of breath.   [name] states that everyone in her household had recently contracted influenza.  Approximately 4 days ago, she started to feel generalized  malaise with bodyaches, nausea, vomiting, fever.  She was started on Tamiflu  by her PCP when she tested positive for influenza at that time.  Her initial symptoms began to improve over the next few days, however for the last 1 to 2 days, she has been experiencing gradually worsening shortness of breath and intermittently productive cough.  Due to this, she came to the ED today.   ED course: On arrival to the ED, patient was normotensive at 138/74 with heart rate of 90 respiratory rate of 20.  She was saturating at 95% on 6 L.  She was afebrile at 98.4.  Initial workup notable for normal CBC and unremarkable BMP.  Troponin, lactic acid and procalcitonin negative.  Influenza A PCR positive.  Chest x-ray was obtained with streaky bibasilar opacities.  Patient started on DuoNebs, Solu-Medrol , and IV fluids.  [facility] contacted for admission.     Assessment and Plan:   # Acute hypoxic respiratory failure due to influenza A viral infection and asthma exacerbation Shortness of breath got worse after developing A-fib with RVR Patient is on 2 L oxygen  at bedtime at baseline.  Supplemental O2 admission gradually weaned off currently patient is saturating well on room air.  Patient was advised to continue oxygen  at home at night. CT chest w/o contrast: . Patchy airspace disease in the upper lungs, greater on the right, with consolidation or atelectasis in the lung bases. This is likely multifocal pneumonia. 2. Fibrosis in the lung apices and bases.  Central bronchiectasis. 2/5 CXR: Improved bilateral lung ventilation since 09/10/2023. Mild residual  left lung base probable atelectasis. No other cardiopulmonary abnormality. 2/7 CXR repeated as patient was complaining of aspiration due to excessive cough which shows improving LLL opacities. Clinically patient's condition improved, medically optimized and cleared for discharge.  She agreed with the discharge planning.   # Acute Asthma exacerbation: S/p Solu-Medrol  125  mg once S/p Solu-Medrol  40 mg IV daily, 2/6 started prednisone  30 mg p.o. daily x 3 days, 20 mg daily for 3 days and 10 mg daily for 3 days. S/p DuoNebs every 6 hours, Pulmicort  twice daily, Brovana  nebulizer twice daily, Zyrtec  and Mucinex  DM as needed for cough. Continue PPI for GERD 2/4 started Augmentin  twice daily for possible sinus infection as per patient's request.  Discharged on Augmentin  twice daily for 4 days to complete 7-day course. 2/7 discharged on prednisone  to complete taper as above. # Atrial fibrillation with RVR  Most likely due to hypoxia induced by influenza A and asthma exacerbation EKG: atrial fibrillation with RVR.  This is a new diagnosis, with history of only paroxysmal SVT.  Likely triggered by respiratory failure, influenza A, steroid use.  CHA2DS2-VASc of 5, started Eliquis  5 mg p.o. twice daily TSH 1.4 within normal range S/p Cardizem  IV gtt, s/p Cardizem  60 TID-->180 po daily, Toprol -XL 25 mg p.o. daily 2/6 increased Toprol -XL 25 mg p.o. twice daily due to persistent tachycardia Cardiology consult appreciated TTE LVEF 55 to 60%, grade 1 diastolic dysfunction, no any other significant findings.   # Influenza A with pneumonia: s/p Tamiflu , continued which was started as an outpatient. S/p Mucinex  600 twice daily and DM as needed for cough # (HFpEF) heart failure with preserved ejection fraction History of HFpEF with EF above 55% and G1DD. Held home prn torsemide given hypovolemia. On 2/4 Lasix  40 mg IV one-time dose given due to pulmonary congestion and on 2/5 Lasix  40 mg IV one-time dose given, x-ray looks fine, no need of more diuretics Resumed torsemide prn home med on discharge. # Coronary artery disease involving native coronary artery of native heart with angina pectoris Patient reporting chest tightness, however likely demand in the setting of severe RVR.  Low suspicion for ACS.  Troponin 38 slightly elevated Initial EKG with no ischemic changes, with  nonspecific ischemic changes on repeat EKG, again likely due to demand. Continued aspirin , statin # Benign essential HTN Started Cardizem , metoprolol  and losartan  25 mg p.o. daily as above BP improved.  Patient was advised to monitor BP at home and follow with PCP and cardiology to titrate medication accordingly. # Hyponatremia, Na 134 improved. # Constipation, resolved, continue laxatives.   # Hypothyroid, continue Synthroid  50 mcg po  Daily    Body mass index is 27.16 kg/m.  Nutrition Interventions:  Patient was seen by physical therapy, who recommended Home health, which was arranged. On the day of the discharge the patient's vitals were stable, and no other acute medical condition were reported by patient. the patient was felt safe to be discharge at Home with Home health.  Consultants: Cardio Procedures: none  Discharge Exam: General: Appear in no distress, no Rash; Oral Mucosa Clear, moist. Cardiovascular: S1 and S2 Present, no Murmur, Respiratory: normal respiratory effort, Bilateral Air entry present and no Crackles, no wheezes Abdomen: Bowel Sound present, Soft and no tenderness, no hernia Extremities: no Pedal edema, no calf tenderness Neurology: alert and oriented to time, place, and person affect appropriate.  Filed Weights   09/15/23 0452 09/16/23 0500 09/17/23 0500  Weight: 67.6 kg 66.3 kg 65.2 kg  Vitals:   09/16/23 2131 09/17/23 0935  BP: 130/76 138/86  Pulse: 95 96  Resp: 18 20  Temp: 97.7 F (36.5 C) 97.9 F (36.6 C)  SpO2: 95% 98%    DISCHARGE MEDICATION: Allergies as of 09/17/2023       Reactions   Codeine Shortness Of Breath, Itching   Bronchospasms and asthma attach   Levofloxacin    Altered mental status: drunk feeling, confused, speech problems Other reaction(s): Unknown   Oxycodone  Itching, Shortness Of Breath   Would take benadryl  to eliminate itching. Has had bronchospasm   Ultram [tramadol] Itching, Other (See Comments)   Bronchospasm  and asthma attack   Nitrofurantoin Diarrhea   Tried again and had no problems with it Severe and long lasting   Nsaids Other (See Comments)   Bloody stools, abdominal pain, ulcer History of gastritis   Sulfacetamide Rash   Fever, abdominal pain   Adhesive [tape] Other (See Comments)   Thin skin. Causes tears. Paper tape okay   Azithromycin     Unsure. Patient does not remember but thinks that it did not work for her.   Ciprofloxacin  Other (See Comments)   Severe pain in neck and down back   Ketorolac Itching   Sulfa Antibiotics Itching, Rash, Other (See Comments)   fever   Tolmetin    Other reaction(s): Other (See Comments) Bloody stools, abdominal pain History of chronic gastritis   Zofran  [ondansetron  Hcl] Other (See Comments)   constipation        Medication List     STOP taking these medications    docusate sodium  100 MG capsule Commonly known as: COLACE   famotidine  20 MG tablet Commonly known as: PEPCID    ipratropium 0.06 % nasal spray Commonly known as: ATROVENT    oseltamivir  75 MG capsule Commonly known as: TAMIFLU        TAKE these medications    acetaminophen  500 MG tablet Commonly known as: TYLENOL  Take 1,000 mg by mouth 2 (two) times daily as needed for moderate pain.   albuterol  108 (90 Base) MCG/ACT inhaler Commonly known as: VENTOLIN  HFA Inhale 2 puffs into the lungs every 6 (six) hours as needed for wheezing or shortness of breath.   amoxicillin -clavulanate 875-125 MG tablet Commonly known as: AUGMENTIN  Take 1 tablet by mouth every 12 (twelve) hours for 4 days.   apixaban  5 MG Tabs tablet Commonly known as: ELIQUIS  Take 1 tablet (5 mg total) by mouth 2 (two) times daily.   ARTIFICIAL TEARS OP Place 1-2 drops into both eyes daily as needed (dry eyes).   aspirin  EC 81 MG tablet Take 81 mg by mouth at bedtime.   benzonatate  100 MG capsule Commonly known as: TESSALON  Take 2 capsules (200 mg total) by mouth every 8 (eight)  hours. What changed:  when to take this reasons to take this   bisacodyl  5 MG EC tablet Commonly known as: DULCOLAX Take 1 tablet (5 mg total) by mouth at bedtime as needed for moderate constipation.   BLACK ELDERBERRY PO Take 1 Dose by mouth 2 (two) times a day.   Calcium  Carbonate-Vitamin D  600-400 MG-UNIT tablet Take 1 tablet by mouth daily.   cholecalciferol  1000 units tablet Commonly known as: VITAMIN D  Take 1,000 Units by mouth at bedtime.   citalopram  20 MG tablet Commonly known as: CELEXA  Take 1 tablet (20 mg total) by mouth daily.   cyanocobalamin  500 MCG tablet Commonly known as: VITAMIN B12 Take 500 mcg by mouth daily.   diclofenac  Sodium 1 %  Gel Commonly known as: VOLTAREN  Apply 4 g topically 4 (four) times daily. What changed:  when to take this reasons to take this   diltiazem  180 MG 24 hr capsule Commonly known as: CARDIZEM  CD Take 1 capsule (180 mg total) by mouth daily. What changed:  medication strength when to take this   diphenhydrAMINE  25 MG tablet Commonly known as: BENADRYL  Take 25-50 mg by mouth daily as needed for itching.   EPINEPHrine  0.3 mg/0.3 mL Soaj injection Commonly known as: EpiPen  2-Pak use as directed for severe allergy reaction   fenofibrate  160 MG tablet TAKE 1 TABLET DAILY FOR CHOLESTEROL   fluticasone  50 MCG/ACT nasal spray Commonly known as: FLONASE  Use 2 sprays in each nostril daily,   fluticasone  furoate-vilanterol 100-25 MCG/ACT Aepb Commonly known as: Breo Ellipta  Inhale 1 puff into the lungs daily.   guaiFENesin  600 MG 12 hr tablet Commonly known as: MUCINEX  Take 600 mg by mouth at bedtime as needed (congestion).   ipratropium-albuterol  0.5-2.5 (3) MG/3ML Soln Commonly known as: DUONEB Take 3 mLs by nebulization every 6 (six) hours as needed.   ketotifen  0.025 % ophthalmic solution Commonly known as: ZADITOR  Place 1 drop into both eyes 2 (two) times daily as needed (allergies).   levocetirizine 5 MG  tablet Commonly known as: XYZAL  Take 1 tablet (5 mg total) by mouth every evening. What changed:  when to take this reasons to take this   levothyroxine  50 MCG tablet Commonly known as: SYNTHROID  Take 50 mcg by mouth daily before breakfast. Brand Name only   losartan  25 MG tablet Commonly known as: COZAAR  Take 25 mg by mouth daily.   Magnesium  250 MG Tabs Take 1 tablet by mouth daily.   melatonin 5 MG Tabs Take 1 tablet by mouth at bedtime. For sleep   methocarbamol  500 MG tablet Commonly known as: ROBAXIN  TAKE ONE-HALF TO 1 TABLET TWICE DAILY AS NEEDED.   metoprolol  succinate 25 MG 24 hr tablet Commonly known as: TOPROL -XL Take 1 tablet (25 mg total) by mouth 2 (two) times daily. What changed: when to take this   multivitamin with minerals tablet Take 1 tablet by mouth daily.   nystatin  100000 UNIT/ML suspension Commonly known as: MYCOSTATIN  Take 5 mLs (500,000 Units total) by mouth 4 (four) times daily.   OXYGEN  Inhale into the lungs. 2 litre at night   pantoprazole  40 MG tablet Commonly known as: PROTONIX  Take 1 tablet (40 mg total) by mouth daily.   polyethylene glycol 17 g packet Commonly known as: MIRALAX  / GLYCOLAX  Take 17 g by mouth daily. Start taking on: September 18, 2023   potassium chloride  SA 20 MEQ tablet Commonly known as: KLOR-CON  M Take 20 mEq by mouth daily as needed.   predniSONE  10 MG tablet Commonly known as: DELTASONE  Take 3 tablets (30 mg total) by mouth daily with breakfast for 1 day, THEN 2 tablets (20 mg total) daily with breakfast for 3 days, THEN 1 tablet (10 mg total) daily with breakfast for 3 days. Start taking on: September 18, 2023   raloxifene  60 MG tablet Commonly known as: EVISTA  Take 60 mg by mouth daily.   rosuvastatin  5 MG tablet Commonly known as: Crestor  Take 1 tablet (5 mg total) by mouth daily.   sodium chloride  0.65 % Soln nasal spray Commonly known as: OCEAN Place 1 spray into both nostrils as needed for  congestion.   torsemide 20 MG tablet Commonly known as: DEMADEX Take 20 mg by mouth daily as needed.  Vitamin A 7.5 MG (25000 UT) Caps Take 1 tablet by mouth 2 (two) times daily.               Durable Medical Equipment  (From admission, onward)           Start     Ordered   09/15/23 1543  For home use only DME Bedside commode  Once       Question:  Patient needs a bedside commode to treat with the following condition  Answer:  Impaired mobility   09/15/23 1542   09/15/23 1542  For home use only DME 4 wheeled rolling walker with seat  Once       Question Answer Comment  Patient needs a walker to treat with the following condition Impaired mobility   Patient needs a walker to treat with the following condition Increased need for rest      09/15/23 1542           Allergies  Allergen Reactions   Codeine Shortness Of Breath and Itching    Bronchospasms and asthma attach   Levofloxacin     Altered mental status: drunk feeling, confused, speech problems Other reaction(s): Unknown    Oxycodone  Itching and Shortness Of Breath    Would take benadryl  to eliminate itching. Has had bronchospasm   Ultram [Tramadol] Itching and Other (See Comments)    Bronchospasm and asthma attack   Nitrofurantoin Diarrhea    Tried again and had no problems with it Severe and long lasting   Nsaids Other (See Comments)    Bloody stools, abdominal pain, ulcer History of gastritis   Sulfacetamide Rash    Fever, abdominal pain   Adhesive [Tape] Other (See Comments)    Thin skin. Causes tears. Paper tape okay   Azithromycin      Unsure. Patient does not remember but thinks that it did not work for her.   Ciprofloxacin  Other (See Comments)    Severe pain in neck and down back    Ketorolac Itching   Sulfa Antibiotics Itching, Rash and Other (See Comments)    fever   Tolmetin     Other reaction(s): Other (See Comments) Bloody stools, abdominal pain History of chronic gastritis    Zofran  [Ondansetron  Hcl] Other (See Comments)    constipation   Discharge Instructions     Call MD for:  difficulty breathing, headache or visual disturbances   Complete by: As directed    Call MD for:  extreme fatigue   Complete by: As directed    Call MD for:  persistant dizziness or light-headedness   Complete by: As directed    Call MD for:  persistant nausea and vomiting   Complete by: As directed    Call MD for:  severe uncontrolled pain   Complete by: As directed    Call MD for:  temperature >100.4   Complete by: As directed    Diet - low sodium heart healthy   Complete by: As directed    Discharge instructions   Complete by: As directed    Follow-up with PCP in 1 week, monitor BP and heart rate at home, follow with PCP to titrate medication accordingly Follow with cardiology in 1 to 2 weeks   Increase activity slowly   Complete by: As directed        The results of significant diagnostics from this hospitalization (including imaging, microbiology, ancillary and laboratory) are listed below for reference.    Significant Diagnostic Studies: DG Chest  Port 1 View Result Date: 09/15/2023 CLINICAL DATA:  78 year old female with shortness of breath, body aches, fever and nausea vomiting. EXAM: PORTABLE CHEST 1 VIEW COMPARISON:  Chest CT 09/10/2023 and earlier. FINDINGS: Portable AP upright view at 0847 hours. Lung volumes have not significantly changed but bilateral ventilation appears significantly improved since 09/10/2023. There is mild residual left lung base curvilinear opacity. Normal cardiac size and mediastinal contours. Visualized tracheal air column is within normal limits. No pneumothorax, pulmonary edema, consolidation, pleural effusion. Right shoulder arthroplasty again noted. Paucity of bowel gas. No acute osseous abnormality identified. IMPRESSION: Improved bilateral lung ventilation since 09/10/2023. Mild residual left lung base probable atelectasis. No other  cardiopulmonary abnormality. Electronically Signed   By: VEAR Hurst M.D.   On: 09/15/2023 09:26   ECHOCARDIOGRAM COMPLETE Result Date: 09/12/2023    ECHOCARDIOGRAM REPORT   Patient Name:   LAQUEISHA CATALINA Baria Date of Exam: 09/12/2023 Medical Rec #:  [id number]         Height:       61.0 in Accession #:    [id number]        Weight:       148.6 lb Date of Birth:  1946/07/04         BSA:          1.665 m Patient Age:    78 years          BP:           135/76 mmHg Patient Gender: F                 HR:           76 bpm. Exam Location:  [facility] Procedure: 2D Echo Indications:     Atrial Fibrillation I48.91  History:         Patient has prior history of Echocardiogram examinations, most                  recent 04/16/2016.  Sonographer:     Thedora Louder RDCS, FASE Referring Phys:  [id number] [place] BROOM Diagnosing Phys: Cara JONETTA Lovelace MD  Sonographer Comments: Technically difficult study due to poor echo windows. IMPRESSIONS  1. Left ventricular ejection fraction, by estimation, is 55 to 60%. The left ventricle has normal function. The left ventricle has no regional wall motion abnormalities. There is mild concentric left ventricular hypertrophy. Left ventricular diastolic parameters are consistent with Grade I diastolic dysfunction (impaired relaxation).  2. Right ventricular systolic function is normal. The right ventricular size is normal.  3. The mitral valve is normal in structure. Trivial mitral valve regurgitation.  4. The aortic valve is normal in structure. Aortic valve regurgitation is not visualized. FINDINGS  Left Ventricle: Left ventricular ejection fraction, by estimation, is 55 to 60%. The left ventricle has normal function. The left ventricle has no regional wall motion abnormalities. The left ventricular internal cavity size was normal in size. There is  mild concentric left ventricular hypertrophy. Left ventricular diastolic parameters are consistent with Grade I diastolic dysfunction (impaired relaxation).  Right Ventricle: The right ventricular size is normal. No increase in right ventricular wall thickness. Right ventricular systolic function is normal. Left Atrium: Left atrial size was normal in size. Right Atrium: Right atrial size was normal in size. Pericardium: There is no evidence of pericardial effusion. Mitral Valve: The mitral valve is normal in structure. Trivial mitral valve regurgitation. Tricuspid Valve: The tricuspid valve is normal in structure. Tricuspid valve regurgitation is mild. Aortic Valve: The  aortic valve is normal in structure. Aortic valve regurgitation is not visualized. Aortic valve peak gradient measures 7.5 mmHg. Pulmonic Valve: The pulmonic valve was normal in structure. Pulmonic valve regurgitation is not visualized. Aorta: The ascending aorta was not well visualized. IAS/Shunts: No atrial level shunt detected by color flow Doppler.  LEFT VENTRICLE PLAX 2D LVIDd:         3.70 cm   Diastology LVIDs:         2.60 cm   LV e' medial:    6.96 cm/s LV PW:         1.30 cm   LV E/e' medial:  12.9 LV IVS:        1.30 cm   LV e' lateral:   10.70 cm/s LVOT diam:     1.80 cm   LV E/e' lateral: 8.4 LV SV:         70 LV SV Index:   42 LVOT Area:     2.54 cm  RIGHT VENTRICLE RV Basal diam:  3.10 cm RV S prime:     14.00 cm/s TAPSE (M-mode): 1.7 cm LEFT ATRIUM           Index        RIGHT ATRIUM           Index LA diam:      3.60 cm 2.16 cm/m   RA Area:     11.00 cm LA Vol (A2C): 18.8 ml 11.29 ml/m  RA Volume:   25.10 ml  15.08 ml/m LA Vol (A4C): 28.2 ml 16.94 ml/m  AORTIC VALVE                 PULMONIC VALVE AV Area (Vmax): 2.30 cm     PV Vmax:        0.96 m/s AV Vmax:        137.00 cm/s  PV Peak grad:   3.7 mmHg AV Peak Grad:   7.5 mmHg     RVOT Peak grad: 4 mmHg LVOT Vmax:      124.00 cm/s LVOT Vmean:     81.000 cm/s LVOT VTI:       0.277 m  AORTA Ao Root diam: 2.90 cm Ao Asc diam:  2.80 cm MITRAL VALVE               TRICUSPID VALVE MV Area (PHT): 3.53 cm    TR Peak grad:   18.1 mmHg MV  Decel Time: 215 msec    TR Vmax:        213.00 cm/s MV E velocity: 90.10 cm/s MV A velocity: 98.00 cm/s  SHUNTS MV E/A ratio:  0.92        Systemic VTI:  0.28 m                            Systemic Diam: 1.80 cm Cara JONETTA Lovelace MD Electronically signed by Cara JONETTA Lovelace MD Signature Date/Time: 09/12/2023/10:15:09 PM    Final    CT CHEST WO CONTRAST Result Date: 09/10/2023 CLINICAL DATA:  Respiratory illness with nondiagnostic x-ray. Asthma, hypothyroidism, iron-deficiency anemia, hypertension, coronary artery disease. EXAM: CT CHEST WITHOUT CONTRAST TECHNIQUE: Multidetector CT imaging of the chest was performed following the standard protocol without IV contrast. RADIATION DOSE REDUCTION: This exam was performed according to the departmental dose-optimization program which includes automated exposure control, adjustment of the mA and/or kV according to patient size and/or use of iterative reconstruction technique. COMPARISON:  Chest radiograph 09/10/2023. CT chest 10/15/2020. Cardiac CT 08/20/2022 FINDINGS: Cardiovascular: Normal heart size. No pericardial effusions. Normal caliber thoracic aorta. Scattered aortic calcification. Mediastinum/Nodes: Esophagus is decompressed. No significant lymphadenopathy. Thyroid  gland is unremarkable. Lungs/Pleura: Scarring in the lung apices and bases. Atelectasis or consolidation in the lung bases. Patchy airspace disease demonstrated in the upper lungs, greater on the right. This likely represents multifocal pneumonia. No pleural effusion or pneumothorax. Central bronchiectasis. Upper Abdomen: Subcentimeter low-attenuation lesions in the liver are too small to characterize but appear to have been present previously, likely cysts or hemangiomas. Musculoskeletal: Degenerative changes in the spine. Compression of the lower thoracic vertebra post kyphoplasty. Postoperative change in the right shoulder. No acute bony abnormalities. IMPRESSION: 1. Patchy airspace disease in the  upper lungs, greater on the right, with consolidation or atelectasis in the lung bases. This is likely multifocal pneumonia. 2. Fibrosis in the lung apices and bases.  Central bronchiectasis. 3. Aortic atherosclerosis. Electronically Signed   By: Elsie Gravely M.D.   On: 09/10/2023 21:34   DG Chest 2 View Result Date: 09/10/2023 CLINICAL DATA:  Shortness of breath. EXAM: CHEST - 2 VIEW COMPARISON:  Chest radiograph dated September 08, 2023. FINDINGS: The heart size and mediastinal contours are unchanged. Streaky right-greater-than-left bibasilar opacities, favored to represent atelectasis. No focal consolidation, pleural effusion, or pneumothorax. Prior right reverse total shoulder arthroplasty and vertebroplasty of the lower thoracic spine. No acute osseous abnormality. IMPRESSION: IMPRESSION Streaky bibasilar opacities, more pronounced on the right, favored to represent atelectasis. No focal consolidation. Electronically Signed   By: Harrietta Sherry M.D.   On: 09/10/2023 14:36   DG Chest 2 View Result Date: 09/08/2023 CLINICAL DATA:  Productive cough EXAM: CHEST - 2 VIEW COMPARISON:  None Available. FINDINGS: Normal mediastinum and cardiac silhouette. Normal pulmonary vasculature. No evidence of effusion, infiltrate, or pneumothorax. No acute bony abnormality. Prior lower thoracic vertebroplasty.  Post shoulder arthroplasty. IMPRESSION: No acute cardiopulmonary process. Electronically Signed   By: Jackquline Boxer M.D.   On: 09/08/2023 17:09    Microbiology: Recent Results (from the past 240 hours)  Resp panel by RT-PCR (RSV, Flu A&B, Covid) Anterior Nasal Swab     Status: Abnormal   Collection Time: 09/10/23  1:30 PM   Specimen: Anterior Nasal Swab  Result Value Ref Range Status   SARS Coronavirus 2 by RT PCR NEGATIVE NEGATIVE Final    Comment: (NOTE) SARS-CoV-2 target nucleic acids are NOT DETECTED.  The SARS-CoV-2 RNA is generally detectable in upper respiratory specimens during the acute  phase of infection. The lowest concentration of SARS-CoV-2 viral copies this assay can detect is 138 copies/mL. A negative result does not preclude SARS-Cov-2 infection and should not be used as the sole basis for treatment or other patient management decisions. A negative result may occur with  improper specimen collection/handling, submission of specimen other than nasopharyngeal swab, presence of viral mutation(s) within the areas targeted by this assay, and inadequate number of viral copies(<138 copies/mL). A negative result must be combined with clinical observations, patient history, and epidemiological information. The expected result is Negative.  Fact Sheet for Patients:  bloggercourse.com  Fact Sheet for Healthcare Providers:  seriousbroker.it  This test is no t yet approved or cleared by the [place]  FDA and  has been authorized for detection and/or diagnosis of SARS-CoV-2 by FDA under an Emergency Use Authorization (EUA). This EUA will remain  in effect (meaning this test can be used) for the duration of the COVID-19 declaration under Section  564(b)(1) of the Act, 21 U.S.C.section 360bbb-3(b)(1), unless the authorization is terminated  or revoked sooner.       Influenza A by PCR POSITIVE (A) NEGATIVE Final   Influenza B by PCR NEGATIVE NEGATIVE Final    Comment: (NOTE) The Xpert Xpress SARS-CoV-2/FLU/RSV plus assay is intended as an aid in the diagnosis of influenza from Nasopharyngeal swab specimens and should not be used as a sole basis for treatment. Nasal washings and aspirates are unacceptable for Xpert Xpress SARS-CoV-2/FLU/RSV testing.  Fact Sheet for Patients: bloggercourse.com  Fact Sheet for Healthcare Providers: seriousbroker.it  This test is not yet approved or cleared by the [place]  FDA and has been authorized for detection and/or diagnosis  of SARS-CoV-2 by FDA under an Emergency Use Authorization (EUA). This EUA will remain in effect (meaning this test can be used) for the duration of the COVID-19 declaration under Section 564(b)(1) of the Act, 21 U.S.C. section 360bbb-3(b)(1), unless the authorization is terminated or revoked.     Resp Syncytial Virus by PCR NEGATIVE NEGATIVE Final    Comment: (NOTE) Fact Sheet for Patients: bloggercourse.com  Fact Sheet for Healthcare Providers: seriousbroker.it  This test is not yet approved or cleared by the [place]  FDA and has been authorized for detection and/or diagnosis of SARS-CoV-2 by FDA under an Emergency Use Authorization (EUA). This EUA will remain in effect (meaning this test can be used) for the duration of the COVID-19 declaration under Section 564(b)(1) of the Act, 21 U.S.C. section 360bbb-3(b)(1), unless the authorization is terminated or revoked.  Performed at [facility], [address] [place], [place] [address]   Blood culture (routine x 2)     Status: None   Collection Time: 09/10/23  4:58 PM   Specimen: BLOOD  Result Value Ref Range Status   Specimen Description BLOOD BLOOD LEFT HAND  Final   Special Requests   Final    BOTTLES DRAWN AEROBIC AND ANAEROBIC Blood Culture adequate volume   Culture   Final    NO GROWTH 5 DAYS Performed at [facility], [address] [place], [place] [address]    Report Status 09/15/2023 FINAL  Final  Blood culture (routine x 2)     Status: None   Collection Time: 09/10/23  5:02 PM   Specimen: BLOOD  Result Value Ref Range Status   Specimen Description BLOOD BLOOD RIGHT HAND  Final   Special Requests   Final    BOTTLES DRAWN AEROBIC AND ANAEROBIC Blood Culture adequate volume   Culture   Final    NO GROWTH 5 DAYS Performed at [facility], [address], [place], [place] [address]    Report Status 09/15/2023 FINAL  Final      Labs: CBC: Recent Labs  Lab 09/11/23 0307 09/12/23 0508 09/13/23 0452 09/14/23 0524 09/15/23 0441 09/16/23 0418 09/17/23 0438  WBC 2.9*   < > 9.0 8.3 10.1 11.9* 9.4  NEUTROABS 2.3  --   --   --   --   --   --   HGB 10.7*   < > 11.4* 12.3 12.9 13.3 13.2  HCT 32.6*   < > 34.1* 37.4 38.0 39.1 39.0  MCV 92.9   < > 91.2 90.6 89.2 87.9 89.9  PLT 191   < > 246 312 399 470* 484*   < > = values in this interval not displayed.   Basic Metabolic Panel: Recent Labs  Lab 09/11/23 0306 09/11/23 0307 09/12/23 0508 09/13/23 0452 09/14/23  9475 09/15/23 0441 09/16/23 0418 09/17/23 0438  NA  --    < > 137 140 136 133* 131* 134*  K  --    < > 3.5 3.4* 4.1 3.6 3.5 3.7  CL  --    < > 102 104 101 96* 95* 98  CO2  --    < > 23 26 26 27 25 26   GLUCOSE  --    < > 138* 120* 121* 126* 138* 95  BUN  --    < > 12 13 12 18 20 19   CREATININE  --    < > 0.59 0.55 0.52 0.63 0.59 0.61  CALCIUM   --    < > 8.6* 8.6* 8.9 9.2 8.9 8.6*  MG 2.1  --  2.2 2.1 2.4  --   --   --   PHOS 2.8  --  2.7 2.6 3.2  --   --   --    < > = values in this interval not displayed.   Liver Function Tests: No results for input(s): AST, ALT, ALKPHOS, BILITOT, PROT, ALBUMIN in the last 168 hours. No results for input(s): LIPASE, AMYLASE in the last 168 hours. No results for input(s): AMMONIA in the last 168 hours. Cardiac Enzymes: No results for input(s): CKTOTAL, CKMB, CKMBINDEX, TROPONINI in the last 168 hours. BNP (last 3 results) No results for input(s): BNP in the last 8760 hours. CBG: No results for input(s): GLUCAP in the last 168 hours.  Time spent: 35 minutes  Signed:  Elvan Sor  [facility] 09/17/2023 11:19 AM

## 2023-09-17 NOTE — Progress Notes (Signed)
 2/Patient under Droplet Precaution, IMM Letter was given to RN assigned to patient to be given at patient's bedside.

## 2023-09-17 NOTE — Plan of Care (Signed)

## 2023-09-20 ENCOUNTER — Other Ambulatory Visit: Payer: Self-pay | Admitting: Physician Assistant

## 2023-09-20 ENCOUNTER — Telehealth: Payer: Self-pay | Admitting: *Deleted

## 2023-09-20 ENCOUNTER — Telehealth: Payer: Self-pay

## 2023-09-20 DIAGNOSIS — F331 Major depressive disorder, recurrent, moderate: Secondary | ICD-10-CM

## 2023-09-20 NOTE — Telephone Encounter (Signed)
 [facility]axed [phone] to [facility] for [facility] and pt appt Friday we can document  and try several times [phone] and lmom to call us  back

## 2023-09-20 NOTE — Progress Notes (Signed)
 Complex Care Management Note  Care Guide Note 09/20/2023 Name: Vanessa Evans MRN: [id number] DOB: 1946/04/17  Vanessa Evans is a 78 y.o. year old female who sees McDonough, Lillie Reining, PA-C for primary care. I reached out to Vanessa Evans by phone today to offer complex care management services.  Ms. Donham was given information about Complex Care Management services today including:   The Complex Care Management services include support from the care team which includes your Nurse Care Manager, Clinical Social Worker, or Pharmacist.  The Complex Care Management team is here to help remove barriers to the health concerns and goals most important to you. Complex Care Management services are voluntary, and the patient may decline or stop services at any time by request to their care team member.   Complex Care Management Consent Status: Patient agreed to services and verbal consent obtained.   Follow up plan:  Telephone appointment with complex care management team member scheduled for:  10/01/2023  Encounter Outcome:  Patient Scheduled  Kandis Ormond, CMA, Care Guide [facility]  [facility], [facility] Guide Direct Dial: [phone]  Fax: [phone] Website: [email or web address]

## 2023-09-21 ENCOUNTER — Telehealth: Payer: Medicare Other | Admitting: Internal Medicine

## 2023-09-21 ENCOUNTER — Telehealth: Payer: Self-pay

## 2023-09-21 NOTE — Telephone Encounter (Signed)
[facility] called that [phone] that they got referral from hospital for home health they going tomorrow for PT evaluation

## 2023-09-24 ENCOUNTER — Encounter: Payer: Self-pay | Admitting: Physician Assistant

## 2023-09-24 ENCOUNTER — Ambulatory Visit (INDEPENDENT_AMBULATORY_CARE_PROVIDER_SITE_OTHER): Payer: Medicare Other | Admitting: Physician Assistant

## 2023-09-24 ENCOUNTER — Inpatient Hospital Stay: Payer: Medicare Other | Admitting: Physician Assistant

## 2023-09-24 VITALS — BP 130/80 | HR 80 | Temp 97.8°F | Resp 16 | Ht 61.0 in | Wt 145.0 lb

## 2023-09-24 DIAGNOSIS — I10 Essential (primary) hypertension: Secondary | ICD-10-CM

## 2023-09-24 DIAGNOSIS — R531 Weakness: Secondary | ICD-10-CM

## 2023-09-24 DIAGNOSIS — Z09 Encounter for follow-up examination after completed treatment for conditions other than malignant neoplasm: Secondary | ICD-10-CM

## 2023-09-24 DIAGNOSIS — I5032 Chronic diastolic (congestive) heart failure: Secondary | ICD-10-CM

## 2023-09-24 DIAGNOSIS — J9601 Acute respiratory failure with hypoxia: Secondary | ICD-10-CM | POA: Diagnosis not present

## 2023-09-24 DIAGNOSIS — I480 Paroxysmal atrial fibrillation: Secondary | ICD-10-CM

## 2023-09-24 MED ORDER — FLUTICASONE FUROATE-VILANTEROL 100-25 MCG/ACT IN AEPB: 1.0000 | INHALATION_SPRAY | Freq: Every day | RESPIRATORY_TRACT | 1 refills | Status: DC

## 2023-09-24 MED ORDER — BENZONATATE 100 MG PO CAPS: 200.0000 mg | ORAL_CAPSULE | Freq: Two times a day (BID) | ORAL | 1 refills | Status: DC | PRN

## 2023-09-24 NOTE — Progress Notes (Signed)
[facility] [address] [place], [place] [address]  Internal MEDICINE  Office Visit Note  Patient Name: Vanessa Evans  [id number]  [id number]  Date of Service: 09/24/2023     Chief Complaint  Patient presents with   Hospitalization Follow-up   Atrial Fibrillation     HPI Pt is here for recent hospital follow up. Micah Flesher to ED on 09/10/23, was in ED for 2 nights before admission 09/12/23-09/17/23 for acute hypoxic respiratory failure with development of afib with RVR, asthma exacerbation, Influenza A with PNA and CHF.  -She had already been taking tamiflu for recent flu dx when her SOB progressed significantly leading her to go to ED. Several family members also had the flu. States this was the worst respiratory illness she has had -On first arrival to ED vitals looked ok, however she went into Afib with RVR and SOB worsened and she was put on oxygen. Fortunately she was able to wean off of this before discharge and only uses at night as she did did previously. She took her last tab of prednisone today and has completed her antibiotics now. She is taking Eliquis BID now due to afib dx. -did see Dr. Edwena Blow, a specialist for CHF on 2/11 and will see her regular cardiologist, Dr. Juliann Pares on Tues. Dr. Edwena Blow gave her 20mg  torsemide and potassium chloride to take as needed when wt increasing with fluid retention -HR and BP greatly improved now and overall feeling better though still weaker than normal and fatigued. Does get a little lightheaded at times still and is taking her time. -She is in need of a pediatric walker due to generalized weakness, balance concerns, and some lightheadedness spells. She is in need of this size to get around home and fit her well when she used a youth walker in the hospital. Gilmer Mor is not enough to feel safe during lightheaded spells and with current weakness -PT is starting today  Current Medication: Outpatient Encounter Medications as of  09/24/2023  Medication Sig   acetaminophen (TYLENOL) 500 MG tablet Take 1,000 mg by mouth 2 (two) times daily as needed for moderate pain.   albuterol (VENTOLIN HFA) 108 (90 Base) MCG/ACT inhaler Inhale 2 puffs into the lungs every 6 (six) hours as needed for wheezing or shortness of breath.   apixaban (ELIQUIS) 5 MG TABS tablet Take 1 tablet (5 mg total) by mouth 2 (two) times daily.   aspirin EC 81 MG tablet Take 81 mg by mouth at bedtime.    bisacodyl (DULCOLAX) 5 MG EC tablet Take 1 tablet (5 mg total) by mouth at bedtime as needed for moderate constipation.   BLACK ELDERBERRY PO Take 1 Dose by mouth 2 (two) times a day.   Calcium Carbonate-Vitamin D 600-400 MG-UNIT tablet Take 1 tablet by mouth daily.   cholecalciferol (VITAMIN D) 1000 UNITS tablet Take 1,000 Units by mouth at bedtime.    citalopram (CELEXA) 20 MG tablet Take 1 tablet (20 mg total) by mouth daily.   cyanocobalamin 500 MCG tablet Take 500 mcg by mouth daily.   diclofenac Sodium (VOLTAREN) 1 % GEL Apply 4 g topically 4 (four) times daily. (Patient taking differently: Apply 4 g topically 4 (four) times daily as needed.)   diltiazem (CARDIZEM CD) 180 MG 24 hr capsule Take 1 capsule (180 mg total) by mouth daily.   diphenhydrAMINE (BENADRYL) 25 MG tablet Take 25-50 mg by mouth daily as needed for itching.   EPINEPHrine (EPIPEN 2-PAK) 0.3 mg/0.3 mL  IJ SOAJ injection use as directed for severe allergy reaction   fenofibrate 160 MG tablet TAKE 1 TABLET DAILY FOR CHOLESTEROL   fluticasone (FLONASE) 50 MCG/ACT nasal spray Use 2 sprays in each nostril daily,   guaiFENesin (MUCINEX) 600 MG 12 hr tablet Take 600 mg by mouth at bedtime as needed (congestion).   Hypromellose (ARTIFICIAL TEARS OP) Place 1-2 drops into both eyes daily as needed (dry eyes).   ipratropium-albuterol (DUONEB) 0.5-2.5 (3) MG/3ML SOLN Take 3 mLs by nebulization every 6 (six) hours as needed.   ketotifen (ZADITOR) 0.025 % ophthalmic solution Place 1 drop into both  eyes 2 (two) times daily as needed (allergies).   levocetirizine (XYZAL) 5 MG tablet Take 1 tablet (5 mg total) by mouth every evening. (Patient taking differently: Take 5 mg by mouth daily as needed for allergies.)   levothyroxine (SYNTHROID, LEVOTHROID) 50 MCG tablet Take 50 mcg by mouth daily before breakfast. Brand Name only   losartan (COZAAR) 25 MG tablet Take 25 mg by mouth daily.   Magnesium 250 MG TABS Take 1 tablet by mouth daily.   Melatonin 5 MG TABS Take 1 tablet by mouth at bedtime. For sleep   methocarbamol (ROBAXIN) 500 MG tablet TAKE ONE-HALF TO 1 TABLET TWICE DAILY AS NEEDED.   metoprolol succinate (TOPROL-XL) 25 MG 24 hr tablet Take 1 tablet (25 mg total) by mouth 2 (two) times daily.   Multiple Vitamins-Minerals (MULTIVITAMIN WITH MINERALS) tablet Take 1 tablet by mouth daily.   nystatin (MYCOSTATIN) 100000 UNIT/ML suspension Take 5 mLs (500,000 Units total) by mouth 4 (four) times daily.   OXYGEN Inhale into the lungs. 2 litre at night   pantoprazole (PROTONIX) 40 MG tablet Take 1 tablet (40 mg total) by mouth daily.   polyethylene glycol powder (GLYCOLAX/MIRALAX) 17 GM/SCOOP powder Take 17 g by mouth daily.   potassium chloride SA (KLOR-CON M) 20 MEQ tablet Take 20 mEq by mouth daily as needed.   raloxifene (EVISTA) 60 MG tablet Take 60 mg by mouth daily.   rosuvastatin (CRESTOR) 5 MG tablet Take 1 tablet (5 mg total) by mouth daily.   sodium chloride (OCEAN) 0.65 % SOLN nasal spray Place 1 spray into both nostrils as needed for congestion.   torsemide (DEMADEX) 20 MG tablet Take 20 mg by mouth daily as needed.   Vitamin A 7.5 MG (25000 UT) CAPS Take 1 tablet by mouth 2 (two) times daily.   [DISCONTINUED] benzonatate (TESSALON) 100 MG capsule Take 2 capsules (200 mg total) by mouth every 8 (eight) hours. (Patient taking differently: Take 200 mg by mouth 2 (two) times daily as needed for cough.)   [DISCONTINUED] fluticasone furoate-vilanterol (BREO ELLIPTA) 100-25 MCG/ACT  AEPB Inhale 1 puff into the lungs daily.   [DISCONTINUED] predniSONE (DELTASONE) 10 MG tablet Take 3 tablets (30 mg total) by mouth daily with breakfast for 1 day, THEN 2 tablets (20 mg total) daily with breakfast for 3 days, THEN 1 tablet (10 mg total) daily with breakfast for 3 days.   benzonatate (TESSALON) 100 MG capsule Take 2 capsules (200 mg total) by mouth 2 (two) times daily as needed for cough.   fluticasone furoate-vilanterol (BREO ELLIPTA) 100-25 MCG/ACT AEPB Inhale 1 puff into the lungs daily.   No facility-administered encounter medications on file as of 09/24/2023.    Surgical History: Past Surgical History:  Procedure Laterality Date   BACK SURGERY  2014   removed bone to get to a benign tumor that was pushing on spinal column  BIOPSY THYROID  2018   bladder biopsies  2003   9 biopsies done by dr. cope.  all benign.  inflammatory process going on in bladder   BREAST BIOPSY Right    benign   BREAST SURGERY Right    lumpectomy   CATARACT EXTRACTION W/PHACO Left 12/25/2014   Procedure: CATARACT EXTRACTION PHACO AND INTRAOCULAR LENS PLACEMENT (IOC);  Surgeon: Galen Manila, MD;  Location: [facility] ORS;  Service: Ophthalmology;  Laterality: Left;  [place] 00:32 AP% 20.0 CDE 6.49   COLONOSCOPY W/ BIOPSIES     COLONOSCOPY W/ POLYPECTOMY  2002, 2004, 2005   adenomatous polyps removed   COLONOSCOPY WITH PROPOFOL N/A 08/23/2017   Procedure: COLONOSCOPY WITH PROPOFOL;  Surgeon: Scot Jun, MD;  Location: [facility] ENDOSCOPY;  Service: Endoscopy;  Laterality: N/A;   COLONOSCOPY WITH PROPOFOL N/A 11/08/2017   Procedure: COLONOSCOPY WITH PROPOFOL;  Surgeon: Scot Jun, MD;  Location: [facility] ENDOSCOPY;  Service: Endoscopy;  Laterality: N/A;   COLONOSCOPY WITH PROPOFOL N/A 06/30/2021   Procedure: COLONOSCOPY WITH PROPOFOL;  Surgeon: Regis Bill, MD;  Location: [facility] ENDOSCOPY;  Service: Endoscopy;  Laterality: N/A;   CYSTOCELE REPAIR N/A 04/09/2016   Procedure: ANTERIOR REPAIR  (CYSTOCELE);  Surgeon: Nadara Mustard, MD;  Location: [facility] ORS;  Service: Gynecology;  Laterality: N/A;   DIAGNOSTIC LAPAROSCOPY  2008   removed both ovaries with cysts, tubes and fibroids   DILATION AND CURETTAGE OF UTERUS  1973   ESOPHAGOGASTRODUODENOSCOPY (EGD) WITH PROPOFOL N/A 08/23/2017   Procedure: ESOPHAGOGASTRODUODENOSCOPY (EGD) WITH PROPOFOL;  Surgeon: Scot Jun, MD;  Location: [facility] ENDOSCOPY;  Service: Endoscopy;  Laterality: N/A;   ESOPHAGOGASTRODUODENOSCOPY (EGD) WITH PROPOFOL N/A 06/30/2021   Procedure: ESOPHAGOGASTRODUODENOSCOPY (EGD) WITH PROPOFOL;  Surgeon: Regis Bill, MD;  Location: [facility] ENDOSCOPY;  Service: Endoscopy;  Laterality: N/A;   EYE SURGERY Bilateral 2016   HERNIA REPAIR Right 2008   Inguinal hernia Repair, ventral hernia repair   IR KYPHO THORACIC WITH BONE BIOPSY  05/12/2022   IR RADIOLOGIST EVAL & MGMT  05/19/2022   IR RADIOLOGIST EVAL & MGMT  05/26/2022   JOINT REPLACEMENT Right 2008   knee   KNEE ARTHROSCOPY Right    LUMBAR LAMINECTOMY/DECOMPRESSION MICRODISCECTOMY Right 03/21/2013   Procedure: Right Lumbar five-Sacral one Laminectomy for Synovial Cyst;  Surgeon: Reinaldo Meeker, MD;  Location: [facility] OR[facility];  Service: Neurosurgery;  Laterality: Right;  right   OOPHORECTOMY     REVERSE SHOULDER ARTHROPLASTY Right 02/22/2018   Procedure: REVERSE SHOULDER ARTHROPLASTY;  Surgeon: Christena Flake, MD;  Location: [facility] ORS;  Service: Orthopedics;  Laterality: Right;   TUBAL LIGATION     UNILATERAL SALPINGECTOMY Left 04/09/2016   Procedure: UNILATERAL SALPINGECTOMY;  Surgeon: Nadara Mustard, MD;  Location: [facility] ORS;  Service: Gynecology;  Laterality: Left;   UPPER GI ENDOSCOPY  2010   with biopsy of gastric erosion   VAGINAL HYSTERECTOMY N/A 04/09/2016   Procedure: HYSTERECTOMY VAGINAL;  Surgeon: Nadara Mustard, MD;  Location: [facility] ORS;  Service: Gynecology;  Laterality: N/A;   VAGINAL HYSTERECTOMY  2017   and bladder tack    Medical  History: Past Medical History:  Diagnosis Date   Arthritis    osteo   Asthma    needs rescue inhaler few times a year   Claustrophobia 04/21/2020   Depression    Dizziness    light headed spells but it has been a while   Dysrhythmia    tachycardia.(cardizem). brady during procedures   Fibromyalgia  GERD (gastroesophageal reflux disease)    gastritis   Headache(784.0)    Heart murmur 2019   aortic. dr. Gwen Pounds not worried about this   Hyperlipidemia    Hypertension    Hypothyroidism    Macular degeneration    Migraine    Orthopnea    Pneumonia    long time ago (greater than 5 years ago)   PONV (postoperative nausea and vomiting)    very anxious during cataract surgery. brady during colonoscopy   Shortness of breath    any exertion, cannot lie flat    Family History: Family History  Problem Relation Age of Onset   Pulmonary fibrosis Mother    Melanoma Father    COPD Sister    Cardiomyopathy Sister        and arrhythmia   Atrial fibrillation Sister    COPD Brother    Pulmonary fibrosis Brother    Cancer Brother    Breast cancer Cousin        paternal 1st    Social History   Socioeconomic History   Marital status: Married    Spouse name: Not on file   Number of children: Not on file   Years of education: Not on file   Highest education level: Not on file  Occupational History   Not on file  Tobacco Use   Smoking status: Former    Current packs/day: 0.00    Types: Cigarettes    Quit date: 1967    Years since quitting: 58.1   Smokeless tobacco: Never  Vaping Use   Vaping status: Never Used  Substance and Sexual Activity   Alcohol use: Yes    Comment: occasionally   Drug use: No   Sexual activity: Not Currently  Other Topics Concern   Not on file  Social History Narrative   Not on file   Social Drivers of Health   Financial Resource Strain: Not on file  Food Insecurity: No Food Insecurity (09/12/2023)   Hunger Vital Sign    Worried About  Running Out of Food in the Last Year: Never true    Ran Out of Food in the Last Year: Never true  Transportation Needs: No Transportation Needs (09/12/2023)   PRAPARE - Administrator, Civil Service (Medical): No    Lack of Transportation (Non-Medical): No  Physical Activity: Not on file  Stress: Not on file  Social Connections: Unknown (09/12/2023)   Social Connection and Isolation Panel [NHANES]    Frequency of Communication with Friends and Family: More than three times a week    Frequency of Social Gatherings with Friends and Family: More than three times a week    Attends Religious Services: More than 4 times per year    Active Member of [employer] or Organizations: Patient unable to answer    Attends Banker Meetings: Patient unable to answer    Marital Status: Married  Catering manager Violence: Not At Risk (09/12/2023)   Humiliation, Afraid, Rape, and Kick questionnaire    Fear of Current or Ex-Partner: No    Emotionally Abused: No    Physically Abused: No    Sexually Abused: No      Review of Systems  Constitutional:  Positive for fatigue. Negative for chills and unexpected weight change.  HENT:  Positive for postnasal drip. Negative for congestion, rhinorrhea, sneezing and sore throat.   Eyes:  Negative for redness.  Respiratory:  Negative for chest tightness and shortness of breath.  Cardiovascular:  Negative for chest pain and palpitations.  Gastrointestinal:  Negative for abdominal pain, constipation, diarrhea, nausea and vomiting.  Genitourinary:  Negative for dysuria and frequency.  Musculoskeletal:  Positive for arthralgias and back pain. Negative for joint swelling and neck pain.  Skin:  Negative for rash.  Neurological:  Positive for weakness and light-headedness. Negative for tremors and numbness.  Hematological:  Negative for adenopathy. Does not bruise/bleed easily.  Psychiatric/Behavioral:  Positive for dysphoric mood. Negative for sleep  disturbance and suicidal ideas. Behavioral problem: Depression.The patient is nervous/anxious.     Vital Signs: BP 130/80   Pulse 80   Temp 97.8 F (36.6 C)   Resp 16   Ht 5\' 1"  (1.549 m)   Wt 145 lb (65.8 kg)   SpO2 96%   BMI 27.40 kg/m    Physical Exam Vitals and nursing note reviewed.  Constitutional:      General: She is not in acute distress.    Appearance: Normal appearance. She is well-developed. She is not diaphoretic.  HENT:     Head: Normocephalic and atraumatic.     Mouth/Throat:     Pharynx: No oropharyngeal exudate.  Eyes:     Pupils: Pupils are equal, round, and reactive to light.  Neck:     Thyroid: No thyromegaly.     Vascular: No JVD.     Trachea: No tracheal deviation.  Cardiovascular:     Rate and Rhythm: Normal rate and regular rhythm.     Heart sounds: Normal heart sounds. No murmur heard.    No friction rub. No gallop.  Pulmonary:     Effort: Pulmonary effort is normal. No respiratory distress.     Breath sounds: No wheezing.  Musculoskeletal:     Cervical back: Normal range of motion and neck supple.  Lymphadenopathy:     Cervical: No cervical adenopathy.  Skin:    General: Skin is warm and dry.  Neurological:     Mental Status: She is alert and oriented to person, place, and time.  Psychiatric:        Behavior: Behavior normal.        Thought Content: Thought content normal.        Judgment: Judgment normal.       Assessment/Plan: 1. Hospital discharge follow-up (Primary) Reviewed hospital course and medication  changes  2. Acute hypoxic respiratory failure (HCC) Likely due to flu and asthma exacerbation, but has now resolved and vitals stable off of oxygen during daytime.  3. Paroxysmal atrial fibrillation (HCC) HR stable in office, continue with current medications and follow up with cardiology as planned  4. Benign essential HTN Stable, continue current medications  5. Chronic heart failure with preserved ejection fraction  (HCC) Follow up with Cardiology as planned  6. Weakness generalized  Generalized weakness and balance concerns, will order walker to help and ensure safety. Pt needs pediatric walker as this is what she used during hospitalization and fit her well. Will order today and pt will also begin PT today as planned   General Counseling: Oris verbalizes understanding of the findings of todays visit and agrees with plan of treatment. I have discussed any further diagnostic evaluation that may be needed or ordered today. We also reviewed her medications today. she has been encouraged to call the office with any questions or concerns that should arise related to todays visit.    Counseling:    No orders of the defined types were placed in this encounter.  This patient was seen by Lynn Ito, PA-C in collaboration with Dr. Beverely Risen as a part of collaborative care agreement.   I have reviewed all medical records from hospital follow up including radiology reports and consults from other physicians. Appropriate follow up diagnostics will be scheduled as needed. Patient/ Family understands the plan of treatment. Time spent 35 minutes.   Dr Lyndon Code, MD Internal Medicine

## 2023-09-28 ENCOUNTER — Ambulatory Visit (INDEPENDENT_AMBULATORY_CARE_PROVIDER_SITE_OTHER): Payer: Medicare Other | Admitting: Podiatry

## 2023-09-28 ENCOUNTER — Telehealth: Payer: Self-pay | Admitting: Podiatry

## 2023-09-28 DIAGNOSIS — L603 Nail dystrophy: Secondary | ICD-10-CM

## 2023-09-28 NOTE — Telephone Encounter (Signed)
Patient is considering taking Betadine and would like instructions on how much to take. Contact telephone number, [phone]

## 2023-09-28 NOTE — Patient Instructions (Signed)

## 2023-09-28 NOTE — Progress Notes (Signed)
Subjective:  Patient ID: Vanessa Evans, female    DOB: June 28, 1946,  MRN: [id number]  Chief Complaint  Patient presents with   Toe Pain    Right foot 3rd toe pt stated that she is still having pain     78 y.o. female presents with the above complaint.  Patient presents with right thickened elongated dystrophic nail to the third toe.  She states is painful to touch and would like to have removed she would like to be department she has not seen and was prior to seeing me denies any other acute complaints pain scale 7 out of 10 dull aching nature   Review of Systems: Negative except as noted in the HPI. Denies N/V/F/Ch.  Past Medical History:  Diagnosis Date   Arthritis    osteo   Asthma    needs rescue inhaler few times a year   Claustrophobia 04/21/2020   Depression    Dizziness    light headed spells but it has been a while   Dysrhythmia    tachycardia.(cardizem). brady during procedures   Fibromyalgia    GERD (gastroesophageal reflux disease)    gastritis   Headache(784.0)    Heart murmur 2019   aortic. dr. Gwen Pounds not worried about this   Hyperlipidemia    Hypertension    Hypothyroidism    Macular degeneration    Migraine    Orthopnea    Pneumonia    long time ago (greater than 5 years ago)   PONV (postoperative nausea and vomiting)    very anxious during cataract surgery. brady during colonoscopy   Shortness of breath    any exertion, cannot lie flat    Current Outpatient Medications:    acetaminophen (TYLENOL) 500 MG tablet, Take 1,000 mg by mouth 2 (two) times daily as needed for moderate pain., Disp: , Rfl:    albuterol (VENTOLIN HFA) 108 (90 Base) MCG/ACT inhaler, Inhale 2 puffs into the lungs every 6 (six) hours as needed for wheezing or shortness of breath., Disp: 8 g, Rfl: 3   apixaban (ELIQUIS) 5 MG TABS tablet, Take 1 tablet (5 mg total) by mouth 2 (two) times daily., Disp: 60 tablet, Rfl: 11   aspirin EC 81 MG tablet, Take 81 mg by mouth at bedtime.  , Disp: , Rfl:    benzonatate (TESSALON) 100 MG capsule, Take 2 capsules (200 mg total) by mouth 2 (two) times daily as needed for cough., Disp: 20 capsule, Rfl: 1   bisacodyl (DULCOLAX) 5 MG EC tablet, Take 1 tablet (5 mg total) by mouth at bedtime as needed for moderate constipation., Disp: 30 tablet, Rfl: 0   BLACK ELDERBERRY PO, Take 1 Dose by mouth 2 (two) times a day., Disp: , Rfl:    Calcium Carbonate-Vitamin D 600-400 MG-UNIT tablet, Take 1 tablet by mouth daily., Disp: , Rfl:    cholecalciferol (VITAMIN D) 1000 UNITS tablet, Take 1,000 Units by mouth at bedtime. , Disp: , Rfl:    citalopram (CELEXA) 20 MG tablet, Take 1 tablet (20 mg total) by mouth daily., Disp: 90 tablet, Rfl: 0   cyanocobalamin 500 MCG tablet, Take 500 mcg by mouth daily., Disp: , Rfl:    diclofenac Sodium (VOLTAREN) 1 % GEL, Apply 4 g topically 4 (four) times daily. (Patient taking differently: Apply 4 g topically 4 (four) times daily as needed.), Disp: 500 g, Rfl: 3   diltiazem (CARDIZEM CD) 180 MG 24 hr capsule, Take 1 capsule (180 mg total) by mouth daily., Disp:  30 capsule, Rfl: 11   diphenhydrAMINE (BENADRYL) 25 MG tablet, Take 25-50 mg by mouth daily as needed for itching., Disp: , Rfl:    EPINEPHrine (EPIPEN 2-PAK) 0.3 mg/0.3 mL IJ SOAJ injection, use as directed for severe allergy reaction, Disp: 2 each, Rfl: 1   fenofibrate 160 MG tablet, TAKE 1 TABLET DAILY FOR CHOLESTEROL, Disp: 90 tablet, Rfl: 0   fluticasone (FLONASE) 50 MCG/ACT nasal spray, Use 2 sprays in each nostril daily,, Disp: 16 g, Rfl: 2   fluticasone furoate-vilanterol (BREO ELLIPTA) 100-25 MCG/ACT AEPB, Inhale 1 puff into the lungs daily., Disp: 60 each, Rfl: 1   guaiFENesin (MUCINEX) 600 MG 12 hr tablet, Take 600 mg by mouth at bedtime as needed (congestion)., Disp: , Rfl:    Hypromellose (ARTIFICIAL TEARS OP), Place 1-2 drops into both eyes daily as needed (dry eyes)., Disp: , Rfl:    ipratropium-albuterol (DUONEB) 0.5-2.5 (3) MG/3ML SOLN, Take  3 mLs by nebulization every 6 (six) hours as needed., Disp: 360 mL, Rfl: 0   ketotifen (ZADITOR) 0.025 % ophthalmic solution, Place 1 drop into both eyes 2 (two) times daily as needed (allergies)., Disp: , Rfl:    levocetirizine (XYZAL) 5 MG tablet, Take 1 tablet (5 mg total) by mouth every evening. (Patient taking differently: Take 5 mg by mouth daily as needed for allergies.), Disp: 90 tablet, Rfl: 0   levothyroxine (SYNTHROID, LEVOTHROID) 50 MCG tablet, Take 50 mcg by mouth daily before breakfast. Brand Name only, Disp: , Rfl:    losartan (COZAAR) 25 MG tablet, Take 25 mg by mouth daily., Disp: , Rfl:    Magnesium 250 MG TABS, Take 1 tablet by mouth daily., Disp: , Rfl:    Melatonin 5 MG TABS, Take 1 tablet by mouth at bedtime. For sleep, Disp: , Rfl:    methocarbamol (ROBAXIN) 500 MG tablet, TAKE ONE-HALF TO 1 TABLET TWICE DAILY AS NEEDED., Disp: , Rfl:    metoprolol succinate (TOPROL-XL) 25 MG 24 hr tablet, Take 1 tablet (25 mg total) by mouth 2 (two) times daily., Disp: 60 tablet, Rfl: 11   Multiple Vitamins-Minerals (MULTIVITAMIN WITH MINERALS) tablet, Take 1 tablet by mouth daily., Disp: , Rfl:    nystatin (MYCOSTATIN) 100000 UNIT/ML suspension, Take 5 mLs (500,000 Units total) by mouth 4 (four) times daily., Disp: 60 mL, Rfl: 0   OXYGEN, Inhale into the lungs. 2 litre at night, Disp: , Rfl:    pantoprazole (PROTONIX) 40 MG tablet, Take 1 tablet (40 mg total) by mouth daily., Disp: 90 tablet, Rfl: 1   polyethylene glycol powder (GLYCOLAX/MIRALAX) 17 GM/SCOOP powder, Take 17 g by mouth daily., Disp: 238 g, Rfl: 0   potassium chloride SA (KLOR-CON M) 20 MEQ tablet, Take 20 mEq by mouth daily as needed., Disp: , Rfl:    raloxifene (EVISTA) 60 MG tablet, Take 60 mg by mouth daily., Disp: , Rfl:    rosuvastatin (CRESTOR) 5 MG tablet, Take 1 tablet (5 mg total) by mouth daily., Disp: 90 tablet, Rfl: 3   sodium chloride (OCEAN) 0.65 % SOLN nasal spray, Place 1 spray into both nostrils as needed for  congestion., Disp: , Rfl:    torsemide (DEMADEX) 20 MG tablet, Take 20 mg by mouth daily as needed., Disp: , Rfl:    Vitamin A 7.5 MG (25000 UT) CAPS, Take 1 tablet by mouth 2 (two) times daily., Disp: , Rfl:   Social History   Tobacco Use  Smoking Status Former   Current packs/day: 0.00   Types:  Cigarettes   Quit date: 17   Years since quitting: 58.1  Smokeless Tobacco Never    Allergies  Allergen Reactions   Codeine Shortness Of Breath and Itching    Bronchospasms and asthma attach   Levofloxacin     Altered mental status": drunk" feeling, confused, speech problems Other reaction(s): Unknown    Oxycodone Itching and Shortness Of Breath    Would take benadryl to eliminate itching. Has had bronchospasm   Ultram [Tramadol] Itching and Other (See Comments)    Bronchospasm and asthma attack   Nitrofurantoin Diarrhea    Tried again and had no problems with it Severe and long lasting   Nsaids Other (See Comments)    Bloody stools, abdominal pain, ulcer History of gastritis   Sulfacetamide Rash    Fever, abdominal pain   Adhesive [Tape] Other (See Comments)    Thin skin. Causes tears. Paper tape okay   Azithromycin     Unsure. Patient does not remember but thinks that it did not work for her.   Ciprofloxacin Other (See Comments)    Severe pain in neck and down back    Ketorolac Itching   Sulfa Antibiotics Itching, Rash and Other (See Comments)    fever   Tolmetin     Other reaction(s): Other (See Comments) Bloody stools, abdominal pain History of chronic gastritis   Zofran [Ondansetron Hcl] Other (See Comments)    constipation   Objective:  There were no vitals filed for this visit. There is no height or weight on file to calculate BMI. Constitutional Well developed. Well nourished.  Vascular Dorsalis pedis pulses palpable bilaterally. Posterior tibial pulses palpable bilaterally. Capillary refill normal to all digits.  No cyanosis or clubbing noted. Pedal  hair growth normal.  Neurologic Normal speech. Oriented to person, place, and time. Epicritic sensation to light touch grossly present bilaterally.  Dermatologic Pain on palpation of the entire/total nail on 1st and 3rd digit of the right No other open wounds. No skin lesions.  Orthopedic: Normal joint ROM without pain or crepitus bilaterally. No visible deformities. No bony tenderness.   Radiographs: None Assessment:   1. Nail dystrophy    Plan:  Patient was evaluated and treated and all questions answered.  Nail contusion/dystrophy third digit, right -Patient elects to proceed with minor surgery to remove entire toenail today. Consent reviewed and signed by patient. -Entire/total nail excised. See procedure note. -Educated on post-procedure care including soaking. Written instructions provided and reviewed. -Patient to follow up in 2 weeks for nail check.  Procedure: Excision of entire/total nail  Location: Right 3rd toe digit Anesthesia: Lidocaine 1% plain; 1.5 mL and Marcaine 0.5% plain; 1.5 mL, digital block. Skin Prep: Betadine. Dressing: Silvadene; telfa; dry, sterile, compression dressing. Technique: Following skin prep, the toe was exsanguinated and a tourniquet was secured at the base of the toe. The affected nail border was freed and excised.  Phenol was applied in standard technique.  No complication noted the tourniquet was then removed and sterile dressing applied. Disposition: Patient tolerated procedure well. Patient to return in 2 weeks for follow-up.   No follow-ups on file.

## 2023-09-29 ENCOUNTER — Ambulatory Visit: Payer: Self-pay

## 2023-09-29 NOTE — Patient Outreach (Signed)
  Care Coordination   09/29/2023 Name: Vanessa Evans MRN: [id number] DOB: 1945-09-03   Care Coordination Outreach Attempts:  Successful contact made with patient.  Patient request to reschedule call.    Follow Up Plan:  Additional outreach attempts will be made to offer the patient complex care management information and services.   Encounter Outcome: Rescheduled appointment to 10/01/23 at 2 pm   Care Coordination Interventions:  No, not indicated    George Ina RN, BSN, CCM [place]  [facility], Population Health Case Manager Phone: [phone]

## 2023-09-30 ENCOUNTER — Telehealth: Payer: Self-pay | Admitting: Physician Assistant

## 2023-09-30 ENCOUNTER — Other Ambulatory Visit: Payer: Self-pay

## 2023-09-30 ENCOUNTER — Ambulatory Visit: Payer: Medicare Other | Admitting: Podiatry

## 2023-09-30 MED ORDER — NYSTATIN 100000 UNIT/ML MT SUSP: 5.0000 mL | Freq: Four times a day (QID) | OROMUCOSAL | 0 refills | Status: DC

## 2023-09-30 NOTE — Telephone Encounter (Signed)
Spoke with pt we sent med

## 2023-10-01 ENCOUNTER — Ambulatory Visit: Payer: Self-pay

## 2023-10-01 NOTE — Patient Outreach (Signed)
Care Coordination   Initial Visit Note   10/01/2023 Name: Vanessa Evans MRN: [id number] DOB: 09-10-1945  Vanessa Evans is a 78 y.o. year old female who sees McDonough, Salomon Fick, PA-C for primary care. I spoke with  Vanessa Evans by phone today.  What matters to the patients health and wellness today? Per chart review patient admitted to the hospital from 09/12/23 to 09/17/23 for acute respiratory failure/ atrial fibrillation, flu with pneumonia.   Patient states she is doing better since discharged from the hospital. She states she is still not 100 %.  Patient states she is dealing with thrush again. She reports having it while in the hospital.  Patient states she follow up with her doctor about this and received a prescription for nystatin.  Patient states she is using her nebulizer as needed and uses her emergency inhaler approximately 3 times per day. Patient states she continues to have SOB with activity and sometimes when talking. Patient states the SOB has gotten somewhat better.   Patient states she experienced an increase in heart failure symptoms a few days after discharge from the hospital. She reports calling her cardiologist who provider her an action plan and prescribed spironolactone.  Patient reports monitoring her weight, oxygen level and blood pressures daily.  She reports today's weight is 141.2, her oxygen ranges from 90-96. She states she wears oxygen at 2L only at hs.  She reports recent blood pressures of 116/59, 90/57, 92/61. Patient states she is receiving home health PT/ SN services.    Goals Addressed             This Visit's Progress    Continued improvement post hospitalization and management of health conditions.       Interventions Today    Flowsheet Row Most Recent Value  Chronic Disease   Chronic disease during today's visit Atrial Fibrillation (AFib), Congestive Heart Failure (CHF), Other  [status post flu/ pneumonia]  General Interventions   General  Interventions Discussed/Reviewed Doctor Visits, General Interventions Discussed  [evaluation of current treatment plan for health conditions and patients adherence to plan as established by provider. Assessed for HF/ atrial fib symptoms and for ongoing flu/ pneumonia symptoms. Education articles mailed on Atrial fibrllation and HF.]  Doctor Visits Discussed/Reviewed Doctor Visits Discussed  [reviewed upcoming provider visits. Advised to keep follow up visits with providers as recommended.]  Exercise Interventions   Exercise Discussed/Reviewed Physical Activity  [Assessed patients current activity level.  Confirmed patient receiving Home health PT services. Advised to follow PT instructed exercises daily]  Education Interventions   Education Provided Provided Education, Provided Printed Education  [Reviewed heart failure/ atrial fibrillation symptoms. Reviewed specified HF action plan.  Advised to notify provider for HF/ atrial fibrillation symptoms .Call 911 for severe symptoms. Advised to elevated HOB at night in order to breath.]  Provided Verbal Education On Other  [Assessed weight, O2 saturation, BP.  Advised ongoing home monitoring of weight, oxygen level, and BP. call doctor for 2 lb or more weight gain overnight or 5 lbs in a week. Continue to wear O2 at 2L at HS as recommended by provider.]  Nutrition Interventions   Nutrition Discussed/Reviewed Nutrition Discussed, Decreasing salt  [Advised to follow a low salt diet.]  Pharmacy Interventions   Pharmacy Dicussed/Reviewed Pharmacy Topics Discussed  [medication reconciliation completed.  Advised to take medications as prescribed. Advised to complete medication assistance application for Eliquis. Advised to rinse mouth thoroughly after taking Breo Ellipta. Use nebulizer as recommended.]  SDOH assessments and interventions completed:  Yes  SDOH Interventions Today    Flowsheet Row Most Recent Value  SDOH Interventions   Food  Insecurity Interventions Intervention Not Indicated  Housing Interventions Intervention Not Indicated  Transportation Interventions Intervention Not Indicated  Utilities Interventions Intervention Not Indicated        Care Coordination Interventions:  Yes, provided   Follow up plan: Follow up call scheduled for 10/18/23    Encounter Outcome:  Patient Visit Completed   Vanessa Ina RN, BSN, CCM [place]  [facility], Population Health Case Manager Phone: [phone]

## 2023-10-01 NOTE — Patient Instructions (Signed)
Visit Information  Thank you for taking time to visit with me today. Please don't hesitate to contact me if I can be of assistance to you.   Following are the goals we discussed today:   Goals Addressed             This Visit's Progress    Continued improvement post hospitalization and management of health conditions.       Interventions Today    Flowsheet Row Most Recent Value  Chronic Disease   Chronic disease during today's visit Atrial Fibrillation (AFib), Congestive Heart Failure (CHF), Other  [status post flu/ pneumonia]  General Interventions   General Interventions Discussed/Reviewed Doctor Visits, General Interventions Discussed  [evaluation of current treatment plan for health conditions and patients adherence to plan as established by provider. Assessed for HF/ atrial fib symptoms and for ongoing flu/ pneumonia symptoms. Education articles mailed on Atrial fibrllation and HF.]  Doctor Visits Discussed/Reviewed Doctor Visits Discussed  [reviewed upcoming provider visits. Advised to keep follow up visits with providers as recommended.]  Exercise Interventions   Exercise Discussed/Reviewed Physical Activity  [Assessed patients current activity level.  Confirmed patient receiving Home health PT services. Advised to follow PT instructed exercises daily]  Education Interventions   Education Provided Provided Education, Provided Printed Education  [Reviewed heart failure/ atrial fibrillation symptoms. Reviewed specified HF action plan.  Advised to notify provider for HF/ atrial fibrillation symptoms .Call 911 for severe symptoms. Advised to elevated HOB at night in order to breath.]  Provided Verbal Education On Other  [Assessed weight, O2 saturation, BP.  Advised ongoing home monitoring of weight, oxygen level, and BP. call doctor for 2 lb or more weight gain overnight or 5 lbs in a week. Continue to wear O2 at 2L at HS as recommended by provider.]  Nutrition Interventions   Nutrition  Discussed/Reviewed Nutrition Discussed, Decreasing salt  [Advised to follow a low salt diet.]  Pharmacy Interventions   Pharmacy Dicussed/Reviewed Pharmacy Topics Discussed  [medication reconciliation completed.  Advised to take medications as prescribed. Advised to complete medication assistance application for Eliquis. Advised to rinse mouth thoroughly after taking Breo Ellipta. Use nebulizer as recommended.]              Our next appointment is by telephone on 10/18/23 at 2:45 pm  Please call the care guide team at [phone] if you need to cancel or reschedule your appointment.   If you are experiencing a Mental Health or Behavioral Health Crisis or need someone to talk to, please call the Suicide and Crisis Lifeline: 988 call 1-800-273-TALK (toll free, 24 hour hotline)  Patient verbalizes understanding of instructions and care plan provided today and agrees to view in MyChart. Active MyChart status and patient understanding of how to access instructions and care plan via MyChart confirmed with patient.     George Ina RN, BSN, CCM [employer], Population Health Case Manager Phone: [phone]

## 2023-10-07 ENCOUNTER — Ambulatory Visit (INDEPENDENT_AMBULATORY_CARE_PROVIDER_SITE_OTHER): Payer: Medicare Other | Admitting: Podiatry

## 2023-10-07 ENCOUNTER — Encounter: Payer: Self-pay | Admitting: Podiatry

## 2023-10-07 DIAGNOSIS — L603 Nail dystrophy: Secondary | ICD-10-CM

## 2023-10-07 MED ORDER — LIDOCAINE 5 % EX OINT
1.0000 | TOPICAL_OINTMENT | CUTANEOUS | 0 refills | Status: AC | PRN
Start: 2023-10-07 — End: ?

## 2023-10-07 NOTE — Progress Notes (Signed)
 Subjective: Vanessa Evans is a 78 y.o.  female returns to office today for follow up evaluation after having right 3rd total nail avulsion performed. Patient has been soaking using epsom salt and applying topical antibiotic covered with bandaid daily. Patient denies fevers, chills, nausea, vomiting. Denies any calf pain, chest pain, SOB.   Objective:  Vitals: Reviewed  General: Well developed, nourished, in no acute distress, alert and oriented x3   Dermatology: Skin is warm, dry and supple bilateral. right 3rd nail bed appears to be clean, dry, with mild granular tissue and surrounding scab. There is no surrounding erythema, edema, drainage/purulence. The remaining nails appear unremarkable at this time. There are no other lesions or other signs of infection present.  Neurovascular status: Intact. No lower extremity swelling; No pain with calf compression bilateral.  Musculoskeletal: Decreased tenderness to palpation of the right 3rd nail bed. Muscular strength within normal limits bilateral.   Assesement and Plan: S/p total nail avulsion, doing well.   -disContinue soaking in epsom salts twice a day followed by antibiotic ointment and a band-aid. Can leave uncovered at night. Continue this until completely healed.  -If the area has not healed in 2 weeks, call the office for follow-up appointment, or sooner if any problems arise.  -Monitor for any signs/symptoms of infection. Call the office immediately if any occur or go directly to the emergency room. Call with any questions/concerns.  Nicholes Rough, DPM

## 2023-10-11 ENCOUNTER — Telehealth: Payer: Self-pay

## 2023-10-11 NOTE — Telephone Encounter (Unsigned)
 PA request received from [facility] for Lidocaine 5% ointment. PA submitted through covermymeds.

## 2023-10-14 ENCOUNTER — Ambulatory Visit: Payer: Medicare Other | Admitting: Podiatry

## 2023-10-14 NOTE — Telephone Encounter (Signed)
 PA was denied

## 2023-10-18 ENCOUNTER — Ambulatory Visit (INDEPENDENT_AMBULATORY_CARE_PROVIDER_SITE_OTHER): Admitting: Nurse Practitioner

## 2023-10-18 ENCOUNTER — Ambulatory Visit
Admission: RE | Admit: 2023-10-18 | Discharge: 2023-10-18 | Disposition: A | Discharge status: Home / Self Care | Source: Ambulatory Visit | Attending: Nurse Practitioner

## 2023-10-18 ENCOUNTER — Encounter: Payer: Self-pay | Admitting: Oncology

## 2023-10-18 ENCOUNTER — Encounter: Payer: Self-pay | Admitting: Nurse Practitioner

## 2023-10-18 ENCOUNTER — Ambulatory Visit
Admission: RE | Admit: 2023-10-18 | Discharge: 2023-10-18 | Disposition: A | Discharge status: Home / Self Care | Attending: Nurse Practitioner | Admitting: Nurse Practitioner

## 2023-10-18 VITALS — BP 131/70 | HR 84 | Temp 97.4°F | Resp 16 | Ht 61.0 in | Wt 146.0 lb

## 2023-10-18 DIAGNOSIS — R062 Wheezing: Secondary | ICD-10-CM | POA: Insufficient documentation

## 2023-10-18 DIAGNOSIS — J189 Pneumonia, unspecified organism: Secondary | ICD-10-CM

## 2023-10-18 DIAGNOSIS — R531 Weakness: Secondary | ICD-10-CM | POA: Diagnosis not present

## 2023-10-18 MED ORDER — BENZONATATE 200 MG PO CAPS: 200.0000 mg | ORAL_CAPSULE | Freq: Three times a day (TID) | ORAL | 0 refills | Status: DC | PRN

## 2023-10-18 MED ORDER — AMOXICILLIN-POT CLAVULANATE 875-125 MG PO TABS
1.0000 | ORAL_TABLET | Freq: Two times a day (BID) | ORAL | 0 refills | Status: AC
Start: 2023-10-18 — End: 2023-10-28

## 2023-10-18 MED ORDER — PREDNISONE 10 MG (48) PO TBPK: ORAL_TABLET | ORAL | 0 refills | Status: DC

## 2023-10-18 NOTE — Progress Notes (Signed)
 No pneumonia seen on xray. May be residual from infection in February or could be bronchitis. Please continue to take antibiotic until gone.

## 2023-10-18 NOTE — Progress Notes (Signed)
 [facility] [address] [place], [place] [address]  Internal MEDICINE  Office Visit Note  Patient Name: Vanessa Evans  [id number]  [id number]  Date of Service: 10/18/2023  Chief Complaint  Patient presents with   Acute Visit    Congestion, cough, negative covid     HPI Vanessa Evans presents for an acute sick visit for continued cough, wheezing and chest congestion --onset of symptoms was about a week ago --reports sinus drainage, cough, SOB, wheezing, chest tightness, coughing fits,  History of asthma --recent flu A in early February and pneumonia, spent a week in the hospital, diagnosed with Afib as well.       Current Medication:  Outpatient Encounter Medications as of 10/18/2023  Medication Sig Note   acetaminophen (TYLENOL) 500 MG tablet Take 1,000 mg by mouth 2 (two) times daily as needed for moderate pain.    acidophilus (RISAQUAD) CAPS capsule Take 1 capsule by mouth daily.    albuterol (VENTOLIN HFA) 108 (90 Base) MCG/ACT inhaler Inhale 2 puffs into the lungs every 6 (six) hours as needed for wheezing or shortness of breath.    amoxicillin-clavulanate (AUGMENTIN) 875-125 MG tablet Take 1 tablet by mouth 2 (two) times daily for 10 days. Take with food    apixaban (ELIQUIS) 5 MG TABS tablet Take 1 tablet by mouth 2 (two) times daily.    aspirin EC 81 MG tablet Take 81 mg by mouth at bedtime.     benzonatate (TESSALON) 200 MG capsule Take 1 capsule (200 mg total) by mouth 3 (three) times daily as needed for cough.    bisacodyl (DULCOLAX) 5 MG EC tablet Take 1 tablet (5 mg total) by mouth at bedtime as needed for moderate constipation.    BLACK ELDERBERRY PO Take 1 Dose by mouth 2 (two) times a day.    Calcium Carbonate-Vitamin D 600-400 MG-UNIT tablet Take 1 tablet by mouth daily.    cholecalciferol (VITAMIN D) 1000 UNITS tablet Take 1,000 Units by mouth at bedtime.     citalopram (CELEXA) 20 MG tablet Take 1 tablet (20 mg total) by mouth daily.     cyanocobalamin 500 MCG tablet Take 500 mcg by mouth daily.    diclofenac Sodium (VOLTAREN) 1 % GEL Apply 4 g topically 4 (four) times daily. (Patient taking differently: Apply 4 g topically 4 (four) times daily as needed.)    diltiazem (CARDIZEM CD) 180 MG 24 hr capsule Take 1 capsule (180 mg total) by mouth daily. 10/01/2023: Patient states taking 120 mg 1 x day   diphenhydrAMINE (BENADRYL) 25 MG tablet Take 25-50 mg by mouth daily as needed for itching.    EPINEPHrine (EPIPEN 2-PAK) 0.3 mg/0.3 mL IJ SOAJ injection use as directed for severe allergy reaction    famotidine (PEPCID) 20 MG tablet Take 20 mg by mouth 2 (two) times daily.    fenofibrate 160 MG tablet TAKE 1 TABLET DAILY FOR CHOLESTEROL    fluticasone (FLONASE) 50 MCG/ACT nasal spray Use 2 sprays in each nostril daily,    fluticasone furoate-vilanterol (BREO ELLIPTA) 100-25 MCG/ACT AEPB Inhale 1 puff into the lungs daily.    guaiFENesin (MUCINEX) 600 MG 12 hr tablet Take 600 mg by mouth at bedtime as needed (congestion).    Hypromellose (ARTIFICIAL TEARS OP) Place 1-2 drops into both eyes daily as needed (dry eyes).    ipratropium-albuterol (DUONEB) 0.5-2.5 (3) MG/3ML SOLN Take 3 mLs by nebulization every 6 (six) hours as needed.    ketotifen (ZADITOR) 0.025 % ophthalmic  solution Place 1 drop into both eyes 2 (two) times daily as needed (allergies).    levocetirizine (XYZAL) 5 MG tablet Take 1 tablet (5 mg total) by mouth every evening. (Patient taking differently: Take 5 mg by mouth daily as needed for allergies.)    levothyroxine (SYNTHROID, LEVOTHROID) 50 MCG tablet Take 50 mcg by mouth daily before breakfast. Brand Name only    lidocaine (XYLOCAINE) 5 % ointment Apply 1 Application topically as needed.    losartan (COZAAR) 25 MG tablet Take 25 mg by mouth daily.    Magnesium 250 MG TABS Take 1 tablet by mouth daily.    Melatonin 5 MG TABS Take 1 tablet by mouth at bedtime. For sleep    methocarbamol (ROBAXIN) 500 MG tablet TAKE  ONE-HALF TO 1 TABLET TWICE DAILY AS NEEDED.    metoprolol succinate (TOPROL-XL) 25 MG 24 hr tablet Take 1 tablet (25 mg total) by mouth 2 (two) times daily. 10/01/2023: Patient states taking 1/2 tablet in the morning 1/2 tablet in the evening.    Multiple Vitamins-Minerals (MULTIVITAMIN WITH MINERALS) tablet Take 1 tablet by mouth daily.    nystatin (MYCOSTATIN) 100000 UNIT/ML suspension Take 5 mLs (500,000 Units total) by mouth 4 (four) times daily.    OXYGEN Inhale into the lungs. 2 litre at night    pantoprazole (PROTONIX) 40 MG tablet Take 1 tablet (40 mg total) by mouth daily. 10/01/2023: 1 tablet 2 x per day   polyethylene glycol powder (GLYCOLAX/MIRALAX) 17 GM/SCOOP powder Take 17 g by mouth daily.    potassium chloride SA (KLOR-CON M) 20 MEQ tablet Take 20 mEq by mouth daily as needed.    predniSONE (STERAPRED UNI-PAK 48 TAB) 10 MG (48) TBPK tablet Take as directed    raloxifene (EVISTA) 60 MG tablet Take 60 mg by mouth daily.    rosuvastatin (CRESTOR) 5 MG tablet Take 1 tablet (5 mg total) by mouth daily.    sodium chloride (OCEAN) 0.65 % SOLN nasal spray Place 1 spray into both nostrils as needed for congestion.    spironolactone (ALDACTONE) 25 MG tablet Take 25 mg by mouth once.    torsemide (DEMADEX) 20 MG tablet Take 20 mg by mouth daily as needed. 10/01/2023: Patient states takes differently. Takes if weight gain of 2 lbs overnight, 3-4 pound in 1-2 days   Vitamin A 7.5 MG (25000 UT) CAPS Take 1 tablet by mouth 2 (two) times daily.    [DISCONTINUED] benzonatate (TESSALON) 100 MG capsule Take 2 capsules (200 mg total) by mouth 2 (two) times daily as needed for cough.    No facility-administered encounter medications on file as of 10/18/2023.      Medical History: Past Medical History:  Diagnosis Date   Arthritis    osteo   Asthma    needs rescue inhaler few times a year   Claustrophobia 04/21/2020   Depression    Dizziness    light headed spells but it has been a while    Dysrhythmia    tachycardia.(cardizem). brady during procedures   Fibromyalgia    GERD (gastroesophageal reflux disease)    gastritis   Headache(784.0)    Heart murmur 2019   aortic. dr. Gwen Pounds not worried about this   Hyperlipidemia    Hypertension    Hypothyroidism    Macular degeneration    Migraine    Orthopnea    Pneumonia    long time ago (greater than 5 years ago)   PONV (postoperative nausea and vomiting)  very anxious during cataract surgery. brady during colonoscopy   Shortness of breath    any exertion, cannot lie flat     Vital Signs: BP 131/70   Pulse 84   Temp (!) 97.4 F (36.3 C)   Resp 16   Ht 5\' 1"  (1.549 m)   Wt 146 lb (66.2 kg)   SpO2 96%   BMI 27.59 kg/m    Review of Systems  Constitutional:  Positive for fatigue. Negative for chills and fever.  HENT:  Positive for congestion, postnasal drip and rhinorrhea. Negative for ear pain, sinus pressure, sinus pain and sore throat.   Respiratory:  Positive for cough, chest tightness, shortness of breath and wheezing.   Cardiovascular: Negative.  Negative for chest pain and palpitations.  Gastrointestinal: Negative.  Negative for diarrhea, nausea and vomiting.  Musculoskeletal:  Negative for myalgias.  Neurological:  Positive for headaches.    Physical Exam Vitals reviewed.  Constitutional:      General: She is not in acute distress.    Appearance: Normal appearance. She is ill-appearing. She is not diaphoretic.  HENT:     Head: Normocephalic and atraumatic.     Right Ear: Tympanic membrane, ear canal and external ear normal.     Left Ear: Tympanic membrane, ear canal and external ear normal.     Nose: Congestion and rhinorrhea present.     Mouth/Throat:     Mouth: Mucous membranes are moist.     Pharynx: Posterior oropharyngeal erythema present.  Eyes:     Pupils: Pupils are equal, round, and reactive to light.  Cardiovascular:     Rate and Rhythm: Normal rate and regular rhythm.     Heart  sounds: Normal heart sounds. No murmur heard.    No friction rub. No gallop.  Pulmonary:     Effort: No respiratory distress.     Breath sounds: Examination of the right-upper field reveals wheezing and rales. Examination of the left-upper field reveals wheezing and rales. Examination of the right-middle field reveals wheezing and rales. Examination of the left-middle field reveals wheezing and rales. Examination of the right-lower field reveals decreased breath sounds and rales. Examination of the left-lower field reveals decreased breath sounds and rales. Decreased breath sounds, wheezing and rales present.  Skin:    Capillary Refill: Capillary refill takes less than 2 seconds.  Neurological:     Mental Status: She is alert and oriented to person, place, and time.  Psychiatric:        Mood and Affect: Mood normal.        Behavior: Behavior normal.       Assessment/Plan: 1. Community acquired pneumonia, bilateral (Primary) Stat chest xray ordered. Take augmentin and prednisone as prescribed until gone. Benzonatate prescribed for relief of cough - DG Chest 2 View; Future - amoxicillin-clavulanate (AUGMENTIN) 875-125 MG tablet; Take 1 tablet by mouth 2 (two) times daily for 10 days. Take with food  Dispense: 20 tablet; Refill: 0 - predniSONE (STERAPRED UNI-PAK 48 TAB) 10 MG (48) TBPK tablet; Take as directed  Dispense: 48 each; Refill: 0 - benzonatate (TESSALON) 200 MG capsule; Take 1 capsule (200 mg total) by mouth 3 (three) times daily as needed for cough.  Dispense: 30 capsule; Refill: 0  2. Wheezing Stat chest xray ordered and prednisone taper prescribed.  - DG Chest 2 View; Future - predniSONE (STERAPRED UNI-PAK 48 TAB) 10 MG (48) TBPK tablet; Take as directed  Dispense: 48 each; Refill: 0  3. Weakness generalized Stat chest  xray ordered  - DG Chest 2 View; Future   General Counseling: Vanessa Evans verbalizes understanding of the findings of todays visit and agrees with plan of  treatment. I have discussed any further diagnostic evaluation that may be needed or ordered today. We also reviewed her medications today. she has been encouraged to call the office with any questions or concerns that should arise related to todays visit.    Counseling:    Orders Placed This Encounter  Procedures   DG Chest 2 View    Meds ordered this encounter  Medications   amoxicillin-clavulanate (AUGMENTIN) 875-125 MG tablet    Sig: Take 1 tablet by mouth 2 (two) times daily for 10 days. Take with food    Dispense:  20 tablet    Refill:  0   predniSONE (STERAPRED UNI-PAK 48 TAB) 10 MG (48) TBPK tablet    Sig: Take as directed    Dispense:  48 each    Refill:  0    Fill new script today   benzonatate (TESSALON) 200 MG capsule    Sig: Take 1 capsule (200 mg total) by mouth 3 (three) times daily as needed for cough.    Dispense:  30 capsule    Refill:  0    Fill new script today    Return if symptoms worsen or fail to improve.  [place] Controlled Substance Database was reviewed by me for overdose risk score (ORS)  Time spent:30 Minutes Time spent with patient included reviewing progress notes, labs, imaging studies, and discussing plan for follow up.   This patient was seen by Sallyanne Kuster, FNP-C in collaboration with Dr. Beverely Risen as a part of collaborative care agreement.  Donae Kueker R. Tedd Sias, MSN, FNP-C Internal Medicine

## 2023-10-19 ENCOUNTER — Telehealth: Payer: Self-pay

## 2023-10-19 ENCOUNTER — Telehealth: Payer: Self-pay | Admitting: Oncology

## 2023-10-19 NOTE — Telephone Encounter (Signed)
 Patient called to reschedule lab only appointment from 3/12. She is sick and on steroids.   Lab appointment rescheduled as requested

## 2023-10-19 NOTE — Telephone Encounter (Signed)
-----   Message from [place] sent at 10/18/2023  3:47 PM EDT ----- No pneumonia seen on xray. May be residual from infection in February or could be bronchitis. Please continue to take antibiotic until gone.

## 2023-10-19 NOTE — Telephone Encounter (Signed)
 Patient notified

## 2023-10-20 ENCOUNTER — Inpatient Hospital Stay: Payer: Medicare Other

## 2023-10-20 ENCOUNTER — Other Ambulatory Visit: Payer: Medicare Other

## 2023-10-26 ENCOUNTER — Ambulatory Visit: Payer: Self-pay

## 2023-10-26 NOTE — Patient Outreach (Signed)
 Care Coordination   Follow Up Visit Note   10/26/2023 Name: Vanessa Evans MRN: [id number] DOB: 02-13-1946  Vanessa Evans is a 78 y.o. year old female who sees McDonough, Salomon Fick, PA-C for primary care. I spoke with  Vanessa Evans by phone today.  What matters to the patients health and wellness today?  Patient reports recent follow up visit on 10/18/23 with pulmonology due to ongoing cold/ congestion symptoms.  She reports chest x-ray was done and she was prescribed a prednisone taper and antibiotic. Patient states she is still coughing up white colored phlegm however reports coughing not as bad as it was. Patient states she has some hoarseness. Patient reports purchasing a new blood pressure monitor last week. Reports blood pressure readings: 126/79, 139/85 and pulse in the 90's. Patient states she has experienced off and on lightheadedness. Patient denies HF symptoms. She states she is scheduled to see her primary care provider on 10/28/23 and cardiologist on 12/28/23.   Goals Addressed             This Visit's Progress    Continued improvement post hospitalization and management of health conditions.       Interventions Today    Flowsheet Row Most Recent Value  Chronic Disease   Chronic disease during today's visit Congestive Heart Failure (CHF), Atrial Fibrillation (AFib), Other  [bilateral CAP.]  General Interventions   General Interventions Discussed/Reviewed General Interventions Reviewed, Doctor Visits  [evaluation of current treatment plan for listed health conditions and adherence to plan as established by provider. Assessed for HF/ atrial fibrillation symptoms. Assessed for CAP symptoms.]  Doctor Visits Discussed/Reviewed Doctor Visits Reviewed  Annabell Sabal upcoming provider visits.]  Education Interventions   Education Provided Provided Education, Provided Printed Education  [Discussed HF/ Atrial fibrillation signs and action plan/ zones. Advised to weigh daily and  record. Advised to take new BP monitor to provider office for calibration. Advised daily monitoring of BP and pulse.]  Provided Verbal Education On When to see the doctor, Other  [HF/ Atrial fibrillation education articles sent to patient in MyChart. Advised to stay hydrated to help with loosening secretions. Advised to notify provider of mild/moderate atrial fibrillation/ HF or call 911 for severe symptoms.]  Nutrition Interventions   Nutrition Discussed/Reviewed Decreasing salt, Nutrition Reviewed  [Advised to adhere to low sodium diet.]  Pharmacy Interventions   Pharmacy Dicussed/Reviewed Pharmacy Topics Reviewed  Algis Downs to take presribed augmentin and prednisone until completed.]              SDOH assessments and interventions completed:  No     Care Coordination Interventions:  Yes, provided   Follow up plan: Follow up call scheduled for 11/16/23 at 11 am    Encounter Outcome:  Patient Visit Completed   George Ina RN, BSN, CCM [place]  [facility], Population Health Case Manager Phone: [phone]

## 2023-10-26 NOTE — Patient Instructions (Signed)
 Visit Information  Thank you for taking time to visit with me today. Please don't hesitate to contact me if I can be of assistance to you.   Following are the goals we discussed today:   Goals Addressed             This Visit's Progress    Continued improvement post hospitalization and management of health conditions.       Interventions Today    Flowsheet Row Most Recent Value  Chronic Disease   Chronic disease during today's visit Congestive Heart Failure (CHF), Atrial Fibrillation (AFib), Other  [bilateral CAP.]  General Interventions   General Interventions Discussed/Reviewed General Interventions Reviewed, Doctor Visits  [evaluation of current treatment plan for listed health conditions and adherence to plan as established by provider. Assessed for HF/ atrial fibrillation symptoms. Assessed for CAP symptoms.]  Doctor Visits Discussed/Reviewed Doctor Visits Reviewed  Annabell Sabal upcoming provider visits.]  Education Interventions   Education Provided Provided Education, Provided Printed Education  [Discussed HF/ Atrial fibrillation signs and action plan/ zones. Advised to weigh daily and record. Advised to take new BP monitor to provider office for calibration. Advised daily monitoring of BP and pulse.]  Provided Verbal Education On When to see the doctor, Other  [HF/ Atrial fibrillation education articles sent to patient in MyChart. Advised to stay hydrated to help with loosening secretions. Advised to notify provider of mild/moderate atrial fibrillation/ HF or call 911 for severe symptoms.]  Nutrition Interventions   Nutrition Discussed/Reviewed Decreasing salt, Nutrition Reviewed  [Advised to adhere to low sodium diet.]  Pharmacy Interventions   Pharmacy Dicussed/Reviewed Pharmacy Topics Reviewed  Algis Downs to take presribed augmentin and prednisone until completed.]              Our next appointment is by telephone on 11/16/23 at 11 am  Please call the care guide team at  [phone] if you need to cancel or reschedule your appointment.   If you are experiencing a Mental Health or Behavioral Health Crisis or need someone to talk to, please call the Suicide and Crisis Lifeline: 988 call 1-800-273-TALK (toll free, 24 hour hotline)  Patient verbalizes understanding of instructions and care plan provided today and agrees to view in MyChart. Active MyChart status and patient understanding of how to access instructions and care plan via MyChart confirmed with patient.     George Ina RN, BSN, CCM [employer], Population Health Case Manager Phone: [phone]

## 2023-10-28 ENCOUNTER — Encounter: Payer: Self-pay | Admitting: Physician Assistant

## 2023-10-28 ENCOUNTER — Ambulatory Visit (INDEPENDENT_AMBULATORY_CARE_PROVIDER_SITE_OTHER): Payer: Medicare Other | Admitting: Physician Assistant

## 2023-10-28 VITALS — BP 119/70 | HR 96 | Temp 97.8°F | Resp 16 | Ht 61.0 in | Wt 146.6 lb

## 2023-10-28 DIAGNOSIS — R300 Dysuria: Secondary | ICD-10-CM

## 2023-10-28 DIAGNOSIS — I480 Paroxysmal atrial fibrillation: Secondary | ICD-10-CM

## 2023-10-28 DIAGNOSIS — E782 Mixed hyperlipidemia: Secondary | ICD-10-CM | POA: Diagnosis not present

## 2023-10-28 DIAGNOSIS — Z0000 Encounter for general adult medical examination without abnormal findings: Secondary | ICD-10-CM

## 2023-10-28 MED ORDER — NYSTATIN 100000 UNIT/ML MT SUSP: 5.0000 mL | Freq: Four times a day (QID) | OROMUCOSAL | 0 refills | Status: DC

## 2023-10-28 MED ORDER — FENOFIBRATE 160 MG PO TABS: 160.0000 mg | ORAL_TABLET | Freq: Every day | ORAL | 0 refills | Status: DC

## 2023-10-28 MED ORDER — NYSTATIN 100000 UNIT/GM EX POWD: 1.0000 | Freq: Three times a day (TID) | CUTANEOUS | 0 refills | Status: DC

## 2023-10-28 NOTE — Progress Notes (Signed)
 [facility] [address] [place], [place] [address]  Internal MEDICINE  Office Visit Note  Patient Name: Vanessa Evans  [id number]  [id number]  Date of Service: 11/09/2023  Chief Complaint  Patient presents with   Medicare Wellness   Depression   Gastroesophageal Reflux   Hypertension   Hyperlipidemia    HPI Vanessa Evans presents for an annual well visit Well-appearing 78 y.o. female Routine mammogram: Would like to continue with mammogram--due in Aug Labs: previously reviewed Other concerns: Having colored productive drainage still. Finishing ABX today. Lungs sound improved. Advised to call if not continuing to improve. -Seeing cardiology and on Eliquis now since development of afib at last hospitalization for acute respiratory failure and pneumonia -needs nystatin--both oral rinse and topical and fenofibrate refills     10/28/2023   11:12 AM 09/14/2022   11:13 AM 09/11/2021   10:11 AM  MMSE - Mini Mental State Exam  Orientation to time 5 5 5   Orientation to Place 5 5 5   Registration 3 3 3   Attention/ Calculation 5 5 5   Recall 3 3 3   Language- name 2 objects 2 2 2   Language- repeat 1 1 1   Language- follow 3 step command 3 3 3   Language- read & follow direction 1 1 1   Write a sentence 1 1 1   Copy design 1 1 1   Total score 30 30 30     Functional Status Survey: Is the patient deaf or have difficulty hearing?: Yes Does the patient have difficulty seeing, even when wearing glasses/contacts?: Yes Does the patient have difficulty concentrating, remembering, or making decisions?: No Does the patient have difficulty walking or climbing stairs?: No Does the patient have difficulty dressing or bathing?: No Does the patient have difficulty doing errands alone such as visiting a doctor's office or shopping?: No     06/18/2022    1:42 PM 09/14/2022   11:12 AM 04/08/2023   10:24 AM 07/12/2023   11:15 AM 10/28/2023   11:12 AM  Fall Risk  Falls in the past year? 0 0 0 0  0  Patient at Risk for Falls Due to  No Fall Risks     Fall risk Follow up  Falls evaluation completed          07/12/2023   11:15 AM  Depression screen PHQ 2/9  Decreased Interest 0  Down, Depressed, Hopeless 0  PHQ - 2 Score 0        No data to display            Current Medication: Outpatient Encounter Medications as of 10/28/2023  Medication Sig Note   acetaminophen (TYLENOL) 500 MG tablet Take 1,000 mg by mouth 2 (two) times daily as needed for moderate pain.    acidophilus (RISAQUAD) CAPS capsule Take 1 capsule by mouth daily.    albuterol (VENTOLIN HFA) 108 (90 Base) MCG/ACT inhaler Inhale 2 puffs into the lungs every 6 (six) hours as needed for wheezing or shortness of breath.    [EXPIRED] amoxicillin-clavulanate (AUGMENTIN) 875-125 MG tablet Take 1 tablet by mouth 2 (two) times daily for 10 days. Take with food    apixaban (ELIQUIS) 5 MG TABS tablet Take 1 tablet by mouth 2 (two) times daily.    aspirin EC 81 MG tablet Take 81 mg by mouth at bedtime.     benzonatate (TESSALON) 200 MG capsule Take 1 capsule (200 mg total) by mouth 3 (three) times daily as needed for cough.    bisacodyl (DULCOLAX)  5 MG EC tablet Take 1 tablet (5 mg total) by mouth at bedtime as needed for moderate constipation.    BLACK ELDERBERRY PO Take 1 Dose by mouth 2 (two) times a day.    Calcium Carbonate-Vitamin D 600-400 MG-UNIT tablet Take 1 tablet by mouth daily.    cholecalciferol (VITAMIN D) 1000 UNITS tablet Take 1,000 Units by mouth at bedtime.     citalopram (CELEXA) 20 MG tablet Take 1 tablet (20 mg total) by mouth daily.    cyanocobalamin 500 MCG tablet Take 500 mcg by mouth daily.    diclofenac Sodium (VOLTAREN) 1 % GEL Apply 4 g topically 4 (four) times daily. (Patient taking differently: Apply 4 g topically 4 (four) times daily as needed.)    diltiazem (CARDIZEM CD) 180 MG 24 hr capsule Take 1 capsule (180 mg total) by mouth daily. 10/01/2023: Patient states taking 120 mg 1 x day    diphenhydrAMINE (BENADRYL) 25 MG tablet Take 25-50 mg by mouth daily as needed for itching.    EPINEPHrine (EPIPEN 2-PAK) 0.3 mg/0.3 mL IJ SOAJ injection use as directed for severe allergy reaction    famotidine (PEPCID) 20 MG tablet Take 20 mg by mouth 2 (two) times daily.    fluticasone (FLONASE) 50 MCG/ACT nasal spray Use 2 sprays in each nostril daily,    fluticasone furoate-vilanterol (BREO ELLIPTA) 100-25 MCG/ACT AEPB Inhale 1 puff into the lungs daily.    guaiFENesin (MUCINEX) 600 MG 12 hr tablet Take 600 mg by mouth at bedtime as needed (congestion).    Hypromellose (ARTIFICIAL TEARS OP) Place 1-2 drops into both eyes daily as needed (dry eyes).    ipratropium-albuterol (DUONEB) 0.5-2.5 (3) MG/3ML SOLN Take 3 mLs by nebulization every 6 (six) hours as needed.    ketotifen (ZADITOR) 0.025 % ophthalmic solution Place 1 drop into both eyes 2 (two) times daily as needed (allergies).    levocetirizine (XYZAL) 5 MG tablet Take 1 tablet (5 mg total) by mouth every evening. (Patient taking differently: Take 5 mg by mouth daily as needed for allergies.)    levothyroxine (SYNTHROID, LEVOTHROID) 50 MCG tablet Take 50 mcg by mouth daily before breakfast. Brand Name only    lidocaine (XYLOCAINE) 5 % ointment Apply 1 Application topically as needed.    losartan (COZAAR) 25 MG tablet Take 25 mg by mouth daily.    Magnesium 250 MG TABS Take 1 tablet by mouth daily.    Melatonin 5 MG TABS Take 1 tablet by mouth at bedtime. For sleep    methocarbamol (ROBAXIN) 500 MG tablet TAKE ONE-HALF TO 1 TABLET TWICE DAILY AS NEEDED.    metoprolol succinate (TOPROL-XL) 25 MG 24 hr tablet Take 1 tablet (25 mg total) by mouth 2 (two) times daily. 10/01/2023: Patient states taking 1/2 tablet in the morning 1/2 tablet in the evening.    Multiple Vitamins-Minerals (MULTIVITAMIN WITH MINERALS) tablet Take 1 tablet by mouth daily.    nystatin (MYCOSTATIN/NYSTOP) powder Apply 1 Application topically 3 (three) times daily.     OXYGEN Inhale into the lungs. 2 litre at night    pantoprazole (PROTONIX) 40 MG tablet Take 1 tablet (40 mg total) by mouth daily. 10/01/2023: 1 tablet 2 x per day   polyethylene glycol powder (GLYCOLAX/MIRALAX) 17 GM/SCOOP powder Take 17 g by mouth daily.    potassium chloride SA (KLOR-CON M) 20 MEQ tablet Take 20 mEq by mouth daily as needed.    predniSONE (STERAPRED UNI-PAK 48 TAB) 10 MG (48) TBPK tablet Take as  directed    raloxifene (EVISTA) 60 MG tablet Take 60 mg by mouth daily.    rosuvastatin (CRESTOR) 5 MG tablet Take 1 tablet (5 mg total) by mouth daily.    sodium chloride (OCEAN) 0.65 % SOLN nasal spray Place 1 spray into both nostrils as needed for congestion.    spironolactone (ALDACTONE) 25 MG tablet Take 25 mg by mouth once.    torsemide (DEMADEX) 20 MG tablet Take 20 mg by mouth daily as needed. 10/01/2023: Patient states takes differently. Takes if weight gain of 2 lbs overnight, 3-4 pound in 1-2 days   Vitamin A 7.5 MG (25000 UT) CAPS Take 1 tablet by mouth 2 (two) times daily.    [DISCONTINUED] fenofibrate 160 MG tablet TAKE 1 TABLET DAILY FOR CHOLESTEROL    [DISCONTINUED] nystatin (MYCOSTATIN) 100000 UNIT/ML suspension Take 5 mLs (500,000 Units total) by mouth 4 (four) times daily.    fenofibrate 160 MG tablet Take 1 tablet (160 mg total) by mouth daily. for cholesterol    nystatin (MYCOSTATIN) 100000 UNIT/ML suspension Take 5 mLs (500,000 Units total) by mouth 4 (four) times daily.    No facility-administered encounter medications on file as of 10/28/2023.    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 cope.  all benign.  inflammatory process going on in bladder   BREAST BIOPSY Right    benign   BREAST SURGERY Right    lumpectomy   CATARACT EXTRACTION W/PHACO Left 12/25/2014    Procedure: CATARACT EXTRACTION PHACO AND INTRAOCULAR LENS PLACEMENT (IOC);  Surgeon: Galen Manila, MD;  Location: [facility] ORS;  Service: Ophthalmology;  Laterality: Left;  [place] 00:32 AP% 20.0 CDE 6.49   COLONOSCOPY W/ BIOPSIES     COLONOSCOPY W/ POLYPECTOMY  2002, 2004, 2005   adenomatous polyps removed   COLONOSCOPY WITH PROPOFOL N/A 08/23/2017   Procedure: COLONOSCOPY WITH PROPOFOL;  Surgeon: Scot Jun, MD;  Location: [facility] ENDOSCOPY;  Service: Endoscopy;  Laterality: N/A;   COLONOSCOPY WITH PROPOFOL N/A 11/08/2017   Procedure: COLONOSCOPY WITH PROPOFOL;  Surgeon: Scot Jun, MD;  Location: [facility] ENDOSCOPY;  Service: Endoscopy;  Laterality: N/A;   COLONOSCOPY WITH PROPOFOL N/A 06/30/2021   Procedure: COLONOSCOPY WITH PROPOFOL;  Surgeon: Regis Bill, MD;  Location: [facility] ENDOSCOPY;  Service: Endoscopy;  Laterality: N/A;   CYSTOCELE REPAIR N/A 04/09/2016   Procedure: ANTERIOR REPAIR (CYSTOCELE);  Surgeon: Nadara Mustard, MD;  Location: [facility] ORS;  Service: Gynecology;  Laterality: N/A;   DIAGNOSTIC LAPAROSCOPY  2008   removed both ovaries with cysts, tubes and fibroids   DILATION AND CURETTAGE OF UTERUS  1973   ESOPHAGOGASTRODUODENOSCOPY (EGD) WITH PROPOFOL N/A 08/23/2017   Procedure: ESOPHAGOGASTRODUODENOSCOPY (EGD) WITH PROPOFOL;  Surgeon: Scot Jun, MD;  Location: [facility] ENDOSCOPY;  Service: Endoscopy;  Laterality: N/A;   ESOPHAGOGASTRODUODENOSCOPY (EGD) WITH PROPOFOL N/A 06/30/2021   Procedure: ESOPHAGOGASTRODUODENOSCOPY (EGD) WITH PROPOFOL;  Surgeon: Regis Bill, MD;  Location: [facility] ENDOSCOPY;  Service: Endoscopy;  Laterality: N/A;   EYE SURGERY Bilateral 2016   HERNIA REPAIR Right 2008   Inguinal hernia Repair, ventral hernia repair   IR KYPHO THORACIC WITH BONE BIOPSY  05/12/2022   IR RADIOLOGIST EVAL & MGMT  05/19/2022   IR RADIOLOGIST EVAL & MGMT  05/26/2022  JOINT REPLACEMENT Right 2008   knee   KNEE ARTHROSCOPY Right    LUMBAR  LAMINECTOMY/DECOMPRESSION MICRODISCECTOMY Right 03/21/2013   Procedure: Right Lumbar five-Sacral one Laminectomy for Synovial Cyst;  Surgeon: Reinaldo Meeker, MD;  Location: [facility];  Service: Neurosurgery;  Laterality: Right;  right   OOPHORECTOMY     REVERSE SHOULDER ARTHROPLASTY Right 02/22/2018   Procedure: REVERSE SHOULDER ARTHROPLASTY;  Surgeon: Christena Flake, MD;  Location: [facility] ORS;  Service: Orthopedics;  Laterality: Right;   TUBAL LIGATION     UNILATERAL SALPINGECTOMY Left 04/09/2016   Procedure: UNILATERAL SALPINGECTOMY;  Surgeon: Nadara Mustard, MD;  Location: [facility] ORS;  Service: Gynecology;  Laterality: Left;   UPPER GI ENDOSCOPY  2010   with biopsy of gastric erosion   VAGINAL HYSTERECTOMY N/A 04/09/2016   Procedure: HYSTERECTOMY VAGINAL;  Surgeon: Nadara Mustard, MD;  Location: [facility] ORS;  Service: Gynecology;  Laterality: N/A;   VAGINAL HYSTERECTOMY  2017   and bladder tack    Medical History: Past Medical History:  Diagnosis Date   Arthritis    osteo   Asthma    needs rescue inhaler few times a year   Claustrophobia 04/21/2020   Depression    Dizziness    light headed spells but it has been a while   Dysrhythmia    tachycardia.(cardizem). brady during procedures   Fibromyalgia    GERD (gastroesophageal reflux disease)    gastritis   Headache(784.0)    Heart murmur 2019   aortic. dr. Gwen Pounds not worried about this   Hyperlipidemia    Hypertension    Hypothyroidism    Macular degeneration    Migraine    Orthopnea    Pneumonia    long time ago (greater than 5 years ago)   PONV (postoperative nausea and vomiting)    very anxious during cataract surgery. brady during colonoscopy   Shortness of breath    any exertion, cannot lie flat    Family History: Family History  Problem Relation Age of Onset   Pulmonary fibrosis Mother    Melanoma Father    COPD Sister    Cardiomyopathy Sister        and arrhythmia   Atrial fibrillation Sister    COPD  Brother    Pulmonary fibrosis Brother    Cancer Brother    Breast cancer Cousin        paternal 1st    Social History   Socioeconomic History   Marital status: Married    Spouse name: Not on file   Number of children: Not on file   Years of education: Not on file   Highest education level: Not on file  Occupational History   Not on file  Tobacco Use   Smoking status: Former    Current packs/day: 0.00    Types: Cigarettes    Quit date: 1967    Years since quitting: 58.2   Smokeless tobacco: Never  Vaping Use   Vaping status: Never Used  Substance and Sexual Activity   Alcohol use: Yes    Alcohol/week: 1.0 standard drink of alcohol    Types: 1 Glasses of wine per week    Comment: occasionally   Drug use: No   Sexual activity: Not Currently  Other Topics Concern   Not on file  Social History Narrative   Not on file   Social Drivers of Health   Financial Resource Strain: Not on file  Food Insecurity: No Food Insecurity (10/01/2023)  Hunger Vital Sign    Worried About Running Out of Food in the Last Year: Never true    Ran Out of Food in the Last Year: Never true  Transportation Needs: No Transportation Needs (10/01/2023)   PRAPARE - Administrator, Civil Service (Medical): No    Lack of Transportation (Non-Medical): No  Physical Activity: Not on file  Stress: Not on file  Social Connections: Unknown (09/12/2023)   Social Connection and Isolation Panel [NHANES]    Frequency of Communication with Friends and Family: More than three times a week    Frequency of Social Gatherings with Friends and Family: More than three times a week    Attends Religious Services: More than 4 times per year    Active Member of [employer] or Organizations: Patient unable to answer    Attends Banker Meetings: Patient unable to answer    Marital Status: Married  Catering manager Violence: Not At Risk (10/01/2023)   Humiliation, Afraid, Rape, and Kick questionnaire     Fear of Current or Ex-Partner: No    Emotionally Abused: No    Physically Abused: No    Sexually Abused: No      Review of Systems  Constitutional:  Positive for fatigue. Negative for chills and unexpected weight change.  HENT:  Positive for congestion and postnasal drip. Negative for rhinorrhea, sneezing and sore throat.   Eyes:  Negative for redness.  Respiratory:  Negative for cough, chest tightness and shortness of breath.   Cardiovascular:  Negative for chest pain and palpitations.  Gastrointestinal:  Negative for abdominal pain, constipation, diarrhea, nausea and vomiting.  Genitourinary:  Negative for dysuria and frequency.  Musculoskeletal:  Positive for arthralgias and back pain. Negative for joint swelling and neck pain.  Skin:  Negative for wound.  Neurological: Negative.  Negative for tremors and numbness.  Hematological:  Negative for adenopathy. Does not bruise/bleed easily.  Psychiatric/Behavioral:  Positive for dysphoric mood. Negative for self-injury, sleep disturbance and suicidal ideas. Behavioral problem: Depression.The patient is nervous/anxious.     Vital Signs: BP 119/70   Pulse 96   Temp 97.8 F (36.6 C)   Resp 16   Ht 5\' 1"  (1.549 m)   Wt 146 lb 9.6 oz (66.5 kg)   SpO2 97%   BMI 27.70 kg/m    Physical Exam Vitals and nursing note reviewed.  Constitutional:      General: She is not in acute distress.    Appearance: Normal appearance. She is well-developed. She is not diaphoretic.  HENT:     Head: Normocephalic and atraumatic.  Eyes:     Pupils: Pupils are equal, round, and reactive to light.  Neck:     Thyroid: No thyromegaly.     Vascular: No JVD.     Trachea: No tracheal deviation.  Cardiovascular:     Rate and Rhythm: Normal rate and regular rhythm.     Heart sounds: Normal heart sounds. No murmur heard.    No friction rub. No gallop.  Pulmonary:     Effort: Pulmonary effort is normal. No respiratory distress.     Breath sounds: No  wheezing.  Skin:    General: Skin is warm and dry.  Neurological:     Mental Status: She is alert and oriented to person, place, and time.  Psychiatric:        Behavior: Behavior normal.        Thought Content: Thought content normal.  Judgment: Judgment normal.        Assessment/Plan: 1. Encounter for Medicare annual wellness exam (Primary) AWV performed, labs previously reviewed, UTD on PHM  2. Paroxysmal atrial fibrillation (HCC) Followed by cardiology, on Eliquis  3. Mixed hyperlipidemia - fenofibrate 160 MG tablet; Take 1 tablet (160 mg total) by mouth daily. for cholesterol  Dispense: 90 tablet; Refill: 0  4. Dysuria - UA/M w/rflx Culture, Routine     General Counseling: Alease verbalizes understanding of the findings of todays visit and agrees with plan of treatment. I have discussed any further diagnostic evaluation that may be needed or ordered today. We also reviewed her medications today. she has been encouraged to call the office with any questions or concerns that should arise related to todays visit.    Orders Placed This Encounter  Procedures   Microscopic Examination   UA/M w/rflx Culture, Routine    Meds ordered this encounter  Medications   nystatin (MYCOSTATIN) 100000 UNIT/ML suspension    Sig: Take 5 mLs (500,000 Units total) by mouth 4 (four) times daily.    Dispense:  60 mL    Refill:  0   nystatin (MYCOSTATIN/NYSTOP) powder    Sig: Apply 1 Application topically 3 (three) times daily.    Dispense:  15 g    Refill:  0   fenofibrate 160 MG tablet    Sig: Take 1 tablet (160 mg total) by mouth daily. for cholesterol    Dispense:  90 tablet    Refill:  0    Return in about 4 months (around 02/27/2024) for general follow up.   Total time spent:35 Minutes Time spent includes review of chart, medications, test results, and follow up plan with the patient.   [place] Controlled Substance Database was reviewed by me.  This patient was seen  by Lynn Ito, PA-C in collaboration with Dr. Beverely Risen as a part of collaborative care agreement.  Lynn Ito, PA-C Internal medicine

## 2023-10-29 LAB — UA/M W/RFLX CULTURE, ROUTINE
Bilirubin, UA: NEGATIVE
Glucose, UA: NEGATIVE
Ketones, UA: NEGATIVE
Leukocytes,UA: NEGATIVE
Nitrite, UA: NEGATIVE
Protein,UA: NEGATIVE
RBC, UA: NEGATIVE
Specific Gravity, UA: 1.007 (ref 1.005–1.030)
Urobilinogen, Ur: 0.2 mg/dL (ref 0.2–1.0)
pH, UA: 6.5 (ref 5.0–7.5)

## 2023-10-29 LAB — MICROSCOPIC EXAMINATION
Bacteria, UA: NONE SEEN
Casts: NONE SEEN /LPF
Epithelial Cells (non renal): NONE SEEN /HPF (ref 0–10)
WBC, UA: NONE SEEN /HPF (ref 0–5)

## 2023-11-04 ENCOUNTER — Ambulatory Visit: Payer: Medicare Other | Admitting: Nurse Practitioner

## 2023-11-08 ENCOUNTER — Ambulatory Visit: Payer: Medicare Other | Admitting: Nurse Practitioner

## 2023-11-10 ENCOUNTER — Inpatient Hospital Stay: Attending: Oncology

## 2023-11-10 DIAGNOSIS — D509 Iron deficiency anemia, unspecified: Secondary | ICD-10-CM | POA: Diagnosis present

## 2023-11-10 LAB — CBC
HCT: 39 % (ref 36.0–46.0)
Hemoglobin: 12.2 g/dL (ref 12.0–15.0)
MCH: 29.8 pg (ref 26.0–34.0)
MCHC: 31.3 g/dL (ref 30.0–36.0)
MCV: 95.4 fL (ref 80.0–100.0)
Platelets: 315 10*3/uL (ref 150–400)
RBC: 4.09 MIL/uL (ref 3.87–5.11)
RDW: 13.4 % (ref 11.5–15.5)
WBC: 6 10*3/uL (ref 4.0–10.5)
nRBC: 0 % (ref 0.0–0.2)

## 2023-11-10 LAB — IRON AND TIBC
Iron: 92 ug/dL (ref 28–170)
Saturation Ratios: 19 % (ref 10.4–31.8)
TIBC: 473 ug/dL — ABNORMAL HIGH (ref 250–450)
UIBC: 381 ug/dL

## 2023-11-10 LAB — FERRITIN: Ferritin: 397 ng/mL — ABNORMAL HIGH (ref 11–307)

## 2023-11-16 ENCOUNTER — Ambulatory Visit: Payer: Self-pay

## 2023-11-16 NOTE — Patient Instructions (Signed)
 Visit Information  Thank you for taking time to visit with me today. Please don't hesitate to contact me if I can be of assistance to you before our next scheduled appointment.  Your case management/ care coordination goals have been me. Please contact your primary care provider office if case management services are needed in the future.    Please call the care guide team at [phone] if you need to cancel, schedule, or reschedule an appointment.   Please call the Suicide and Crisis Lifeline: 988 call 1-800-273-TALK (toll free, 24 hour hotline) if you are experiencing a Mental Health or Behavioral Health Crisis or need someone to talk to.  George Ina RN, BSN, CCM [employer], Population Health Case Manager Phone: [phone]

## 2023-11-16 NOTE — Patient Outreach (Signed)
 Complex Care Management   Visit Note  11/16/2023  Name:  Vanessa Evans MRN: [id number] DOB: September 04, 1945  Situation: Referral received for Complex Care Management related to Heart Failure and Atrial Fibrillation I obtained verbal consent from patient.  Visit completed with patient  on the phone   Patient reports recently having bronchitis.  She states this is better after being treated with antibiotic and prednisone. Patient reports having swelling in her feet and ankles over the last few days. She reports taking extra fluid pill as instructed by provider resulting in decrease in swelling.  Patient denies having overnight weight increase. Denies increase in atrial fibrillation symptoms. Report pulse rate ranges in the 90's.  Patient states she experiences shortness of breath fairly easily however states this is baseline for her.  Reports ongoing use of 2L oxygen at hs.  Patient states she is being followed closely by her providers and has no further needs or concerns at this time. Agreeable that case management/ care coordination goals have been met.    Background:   Past Medical History:  Diagnosis Date   Arthritis    osteo   Asthma    needs rescue inhaler few times a year   Claustrophobia 04/21/2020   Depression    Dizziness    light headed spells but it has been a while   Dysrhythmia    tachycardia.(cardizem). brady during procedures   Fibromyalgia    GERD (gastroesophageal reflux disease)    gastritis   Headache(784.0)    Heart murmur 2019   aortic. dr. Gwen Pounds not worried about this   Hyperlipidemia    Hypertension    Hypothyroidism    Macular degeneration    Migraine    Orthopnea    Pneumonia    long time ago (greater than 5 years ago)   PONV (postoperative nausea and vomiting)    very anxious during cataract surgery. brady during colonoscopy   Shortness of breath    any exertion, cannot lie flat    Assessment: Patient Reported Symptoms:  Cognitive     Neurological Not assessed    HEENT Not assessed    Cardiovascular Swelling in legs or feet (patient reports having increase swelling in LE over the last couple of days. She reports taking an extra fluid pill as per her provider recommendation and states swelling has decreased. Denies increase weight gain overnight.)    Respiratory Shortness of breath patient states she gets SOB fairly easily however states this is baseline for her. She states she wears oxygen at 1 1/2 L at HS as recommended by provider.  Endocrine No symptoms reported    Gastrointestinal No symptoms reported    Genitourinary No symptoms reported    Integumentary No symptoms reported    Musculoskeletal No symptoms reported    Psychosocial No symptoms reported     Vitals:   11/16/23 1137  BP: 119/70  Pulse: 96    Medications Reviewed Today     Reviewed by Otho Ket, RN (Registered Nurse) on 11/16/23 at 1137  Med List Status: <None>   Medication Order Taking? Sig Documenting Provider Last Dose Status Informant  acetaminophen (TYLENOL) 500 MG tablet [id number] Yes Take 1,000 mg by mouth 2 (two) times daily as needed for moderate pain. [provider] Taking Active Self, Pharmacy Records  acidophilus (RISAQUAD) CAPS capsule [id number] Yes Take 1 capsule by mouth daily. [provider] Taking Active Self  albuterol (VENTOLIN HFA) 108 (90 Base) MCG/ACT inhaler [id number] Yes Inhale 2  puffs into the lungs every 6 (six) hours as needed for wheezing or shortness of breath. Carlean Jews, PA-C Taking Active Self, Pharmacy Records  apixaban (ELIQUIS) 5 MG TABS tablet [id number] Yes Take 1 tablet by mouth 2 (two) times daily. [provider] Taking Active   aspirin EC 81 MG tablet [id number] Yes Take 81 mg by mouth at bedtime.  [provider] Taking Active Self, Pharmacy Records  benzonatate (TESSALON) 200 MG capsule [id number] Yes Take 1 capsule (200 mg total) by mouth 3 (three)  times daily as needed for cough. Sallyanne Kuster, NP Taking Active   bisacodyl (DULCOLAX) 5 MG EC tablet [id number] Yes Take 1 tablet (5 mg total) by mouth at bedtime as needed for moderate constipation. Gillis Santa, MD Taking Active   BLACK ELDERBERRY PO [id number] Yes Take 1 Dose by mouth 2 (two) times a day. [provider] Taking Active Self, Pharmacy Records  Calcium Carbonate-Vitamin D 600-400 MG-UNIT tablet [id number] Yes Take 1 tablet by mouth daily. [provider] Taking Active Self, Pharmacy Records  cholecalciferol (VITAMIN D) 1000 UNITS tablet [id number] Yes Take 1,000 Units by mouth at bedtime.  [provider] Taking Active Self, Pharmacy Records  citalopram (CELEXA) 20 MG tablet [id number] Yes Take 1 tablet (20 mg total) by mouth daily. Carlean Jews, PA-C Taking Active   cyanocobalamin 500 MCG tablet [id number] Yes Take 500 mcg by mouth daily. [provider] Taking Active Self, Pharmacy Records  diclofenac Sodium (VOLTAREN) 1 % GEL [id number] Yes Apply 4 g topically 4 (four) times daily.  Patient taking differently: Apply 4 g topically 4 (four) times daily as needed.   McDonough, Salomon Fick, PA-C Taking Active Self, Pharmacy Records  diltiazem (CARDIZEM CD) 180 MG 24 hr capsule [id number] Yes Take 1 capsule (180 mg total) by mouth daily. Gillis Santa, MD Taking Active            Med Note Chilton Si, Anik Wesch E   Fri Oct 01, 2023  2:22 PM) Patient states taking 120 mg 1 x day  diphenhydrAMINE (BENADRYL) 25 MG tablet [id number] Yes Take 25-50 mg by mouth daily as needed for itching. [provider] Taking Active Self, Pharmacy Records  EPINEPHrine (EPIPEN 2-PAK) 0.3 mg/0.3 mL IJ SOAJ injection [id number] Yes use as directed for severe allergy reaction Yevonne Pax, MD Taking Active Self, Pharmacy Records  famotidine (PEPCID) 20 MG tablet [id number] Yes Take 20 mg by mouth 2 (two) times daily. [provider] Taking Active Self   fenofibrate 160 MG tablet [id number] Yes Take 1 tablet (160 mg total) by mouth daily. for cholesterol McDonough, Salomon Fick, PA-C Taking Active   fluticasone (FLONASE) 50 MCG/ACT nasal spray [id number] Yes Use 2 sprays in each nostril daily, McDonough, Salomon Fick, PA-C Taking Active Self, Pharmacy Records  fluticasone furoate-vilanterol (BREO ELLIPTA) 100-25 MCG/ACT AEPB [id number] Yes Inhale 1 puff into the lungs daily. McDonough, Salomon Fick, PA-C Taking Active   guaiFENesin (MUCINEX) 600 MG 12 hr tablet [id number] Yes Take 600 mg by mouth at bedtime as needed (congestion). [provider] Taking Active Self, Pharmacy Records  Hypromellose (ARTIFICIAL TEARS OP) [id number] Yes Place 1-2 drops into both eyes daily as needed (dry eyes). [provider] Taking Active Self, Pharmacy Records  ipratropium-albuterol (DUONEB) 0.5-2.5 (3) MG/3ML SOLN [id number] Yes Take 3 mLs by nebulization every 6 (six) hours as needed. Carlean Jews, PA-C Taking Active Self, Pharmacy Records  ketotifen (ZADITOR) 0.025 % ophthalmic solution [id number] Yes Place 1 drop into both  eyes 2 (two) times daily as needed (allergies). [provider] Taking Active Self, Pharmacy Records  levocetirizine (XYZAL) 5 MG tablet [id number] Yes Take 1 tablet (5 mg total) by mouth every evening.  Patient taking differently: Take 5 mg by mouth daily as needed for allergies.   McDonough, Salomon Fick, PA-C Taking Active Self, Pharmacy Records  levothyroxine (SYNTHROID, LEVOTHROID) 50 MCG tablet [id number] Yes Take 50 mcg by mouth daily before breakfast. Brand Name only [provider] Taking Active Self, Pharmacy Records  lidocaine (XYLOCAINE) 5 % ointment [id number] Yes Apply 1 Application topically as needed. Candelaria Stagers, DPM Taking Active   losartan (COZAAR) 25 MG tablet [id number] Yes Take 25 mg by mouth daily. [provider] Taking Active Self, Pharmacy Records  Magnesium 250 MG TABS [id number] Yes Take 1  tablet by mouth daily. [provider] Taking Active Self, Pharmacy Records  Melatonin 5 MG TABS [id number] Yes Take 1 tablet by mouth at bedtime. For sleep [provider] Taking Active Self, Pharmacy Records  methocarbamol (ROBAXIN) 500 MG tablet [id number] Yes TAKE ONE-HALF TO 1 TABLET TWICE DAILY AS NEEDED. [provider] Taking Active Self, Pharmacy Records  metoprolol succinate (TOPROL-XL) 25 MG 24 hr tablet [id number] Yes Take 1 tablet (25 mg total) by mouth 2 (two) times daily. Gillis Santa, MD Taking Active            Med Note Chilton Si, Deshondra Worst E   Fri Oct 01, 2023  2:21 PM) Patient states taking 1/2 tablet in the morning 1/2 tablet in the evening.   Multiple Vitamins-Minerals (MULTIVITAMIN WITH MINERALS) tablet [id number] Yes Take 1 tablet by mouth daily. [provider] Taking Active Self, Pharmacy Records  nystatin (MYCOSTATIN) 100000 UNIT/ML suspension [id number] Yes Take 5 mLs (500,000 Units total) by mouth 4 (four) times daily. McDonough, Salomon Fick, PA-C Taking Active   nystatin (MYCOSTATIN/NYSTOP) powder [id number] Yes Apply 1 Application topically 3 (three) times daily. Carlean Jews, PA-C Taking Active   OXYGEN [id number]  Inhale into the lungs. 2 litre at night [provider]  Active Self, Pharmacy Records  pantoprazole (PROTONIX) 40 MG tablet [id number] Yes Take 1 tablet (40 mg total) by mouth daily. McDonough, Salomon Fick, PA-C Taking Active Self, Pharmacy Records           Med Note Chilton Si, Syeda Prickett E   Fri Oct 01, 2023  2:25 PM) 1 tablet 2 x per day  polyethylene glycol powder (GLYCOLAX/MIRALAX) 17 GM/SCOOP powder [id number] Yes Take 17 g by mouth daily. Gillis Santa, MD Taking Active   potassium chloride SA (KLOR-CON M) 20 MEQ tablet [id number] Yes Take 20 mEq by mouth daily as needed. [provider] Taking Active Self, Pharmacy Records  predniSONE (STERAPRED UNI-PAK 48 TAB) 10 MG (48) TBPK tablet [id number] No Take as directed   Patient not taking: Reported on 11/16/2023   Sallyanne Kuster, NP Not Taking Active   raloxifene (EVISTA) 60 MG tablet [id number] Yes Take 60 mg by mouth daily. [provider] Taking Active Self, Pharmacy Records  rosuvastatin (CRESTOR) 5 MG tablet [id number] Yes Take 1 tablet (5 mg total) by mouth daily. Carlean Jews, PA-C Taking Active Self, Pharmacy Records  sodium chloride (OCEAN) 0.65 % SOLN nasal spray [id number] Yes Place 1 spray into both nostrils as needed for congestion. [provider] Taking Active Self, Pharmacy Records  spironolactone (ALDACTONE) 25 MG tablet [id number] Yes Take 25 mg by mouth once. [provider] Taking Active Self  torsemide (DEMADEX) 20 MG tablet  [id number] Yes Take 20 mg by mouth daily as needed. [provider] Taking Active Self, Pharmacy Records           Med Note Chilton Si, Sharryn Belding E   Fri Oct 01, 2023  2:27 PM) Patient states takes differently. Takes if weight gain of 2 lbs overnight, 3-4 pound in 1-2 days  Vitamin A 7.5 MG (25000 UT) CAPS [id number] Yes Take 1 tablet by mouth 2 (two) times daily. [provider] Taking Active Self, Pharmacy Records            Recommendation:   Closure to case management / care coordination services at patient request.    Advised patient to contact primary care provider office if case management services are needed in the future.   Follow Up Plan:   No further follow up needed at this time.   George Ina RN, BSN, CCM [employer], Population Health Case Manager Phone: [phone]

## 2023-11-19 ENCOUNTER — Other Ambulatory Visit: Payer: Self-pay | Admitting: Physician Assistant

## 2023-11-19 DIAGNOSIS — Z9189 Other specified personal risk factors, not elsewhere classified: Secondary | ICD-10-CM

## 2023-11-26 ENCOUNTER — Other Ambulatory Visit: Payer: Self-pay | Admitting: Physician Assistant

## 2023-11-26 DIAGNOSIS — Z9189 Other specified personal risk factors, not elsewhere classified: Secondary | ICD-10-CM

## 2023-11-26 DIAGNOSIS — J301 Allergic rhinitis due to pollen: Secondary | ICD-10-CM

## 2023-12-01 ENCOUNTER — Ambulatory Visit (INDEPENDENT_AMBULATORY_CARE_PROVIDER_SITE_OTHER)

## 2023-12-01 ENCOUNTER — Ambulatory Visit (INDEPENDENT_AMBULATORY_CARE_PROVIDER_SITE_OTHER): Admitting: Podiatry

## 2023-12-01 ENCOUNTER — Encounter: Payer: Self-pay | Admitting: Podiatry

## 2023-12-01 DIAGNOSIS — M2041 Other hammer toe(s) (acquired), right foot: Secondary | ICD-10-CM | POA: Diagnosis not present

## 2023-12-01 DIAGNOSIS — D2371 Other benign neoplasm of skin of right lower limb, including hip: Secondary | ICD-10-CM

## 2023-12-01 DIAGNOSIS — L603 Nail dystrophy: Secondary | ICD-10-CM

## 2023-12-01 NOTE — Progress Notes (Signed)
 Vanessa Evans presents with her husband today states that is the same problem as it was when she saw Dr. Lydia Sams third digit right foot he removed her nail and the nail is growing back and is still tender.  She states that she has some tenderness on the tip of the toe.  She states that Dr. Lydia Sams trimmed the callus and made it really sore and it has not felt good since.  Objective: Vital signs are stable alert and oriented x 3.  Pulses are palpable.  Third digit right foot demonstrates a reactive nail plate with some thickening.  She also has a callus to the end of the toe secondary to rigid hammertoe deformity.  No erythema cellulitis drainage or odor no open lesions or wounds.  Assessment: Nail dystrophy third digit right foot and hammertoe deformity with distal clavus preulcerative lesion.  Plan: Debrided the nail today.  Did not debride the ulcerative lesion but did place silicone padding and a buttress in the sulcus.

## 2023-12-02 ENCOUNTER — Ambulatory Visit (INDEPENDENT_AMBULATORY_CARE_PROVIDER_SITE_OTHER): Payer: Medicare Other | Admitting: Podiatry

## 2023-12-02 ENCOUNTER — Encounter: Payer: Self-pay | Admitting: Podiatry

## 2023-12-02 DIAGNOSIS — M79674 Pain in right toe(s): Secondary | ICD-10-CM

## 2023-12-02 DIAGNOSIS — M79675 Pain in left toe(s): Secondary | ICD-10-CM | POA: Diagnosis not present

## 2023-12-02 DIAGNOSIS — B351 Tinea unguium: Secondary | ICD-10-CM | POA: Diagnosis not present

## 2023-12-02 NOTE — Addendum Note (Signed)
 Addended by: Sanda Crome on: 12/02/2023 08:33 AM   Modules accepted: Level of Service

## 2023-12-06 ENCOUNTER — Ambulatory Visit (INDEPENDENT_AMBULATORY_CARE_PROVIDER_SITE_OTHER): Admitting: Internal Medicine

## 2023-12-06 ENCOUNTER — Encounter: Payer: Self-pay | Admitting: Internal Medicine

## 2023-12-06 VITALS — BP 110/70 | HR 87 | Temp 98.3°F | Resp 16 | Ht 61.0 in | Wt 143.4 lb

## 2023-12-06 DIAGNOSIS — I5032 Chronic diastolic (congestive) heart failure: Secondary | ICD-10-CM | POA: Diagnosis not present

## 2023-12-06 DIAGNOSIS — R0602 Shortness of breath: Secondary | ICD-10-CM

## 2023-12-06 DIAGNOSIS — J4520 Mild intermittent asthma, uncomplicated: Secondary | ICD-10-CM

## 2023-12-06 NOTE — Patient Instructions (Signed)
 Asthma, Adult  Asthma is a condition that causes swelling and narrowing of the airways. These are the passages that lead from the nose and mouth down into the lungs. When asthma symptoms get worse it is called an asthma attack or flare. This can make it hard to breathe. Asthma flares can range from minor to life-threatening. There is no cure for asthma, but medicines and lifestyle changes can help to control it. What are the causes? It is not known exactly what causes asthma, but certain things can cause asthma symptoms to get worse (triggers). What can trigger an asthma attack? Cigarette smoke. Mold. Dust. Your pet's skin flakes (dander). Cockroaches. Pollen. Air pollution (like household cleaners, wood smoke, smog, or Therapist, occupational). What are the signs or symptoms? Trouble breathing (shortness of breath). Coughing. Making high-pitched whistling sounds when you breathe, most often when you breathe out (wheezing). Chest tightness. Tiredness with little activity. Poor exercise tolerance. How is this treated? Controller medicines that help prevent asthma symptoms. Fast-acting reliever or rescue medicines. These give short-term relief of asthma symptoms. Allergy medicines if your attacks are brought on by allergens. Medicines to help control the body's defense (immune) system. Staying away from the things that cause asthma attacks. Follow these instructions at home: Avoiding triggers in your home Do not allow anyone to smoke in your home. Limit use of fireplaces and wood stoves. Get rid of pests (such as roaches and mice) and their droppings. Keep your home clean. Clean your floors. Dust regularly. Use cleaning products that do not smell. Wash bed sheets and blankets every week in hot water. Dry them in a dryer. Have someone vacuum when you are not home. Change your heating and air conditioning filters often. Use blankets that are made of polyester or cotton. General  instructions Take over-the-counter and prescription medicines only as told by your doctor. Do not smoke or use any products that contain nicotine or tobacco. If you need help quitting, ask your doctor. Stay away from secondhand smoke. Avoid doing things outdoors when allergen counts are high and when air quality is low. Warm up before you exercise. Take time to cool down after exercise. Use a peak flow meter as told by your doctor. A peak flow meter is a tool that measures how well your lungs are working. Keep track of the peak flow meter's readings. Write them down. Follow your asthma action plan. This is a written plan for taking care of your asthma and treating your attacks. Make sure you get all the shots (vaccines) that your doctor recommends. Ask your doctor about a flu shot and a pneumonia shot. Keep all follow-up visits. Contact a doctor if: You have wheezing, shortness of breath, or a cough even while taking medicine to prevent attacks. The mucus you cough up (sputum) is thicker than usual. The mucus you cough up changes from clear or white to yellow, green, gray, or is bloody. You have problems from the medicine you are taking, such as: A rash. Itching. Swelling. Trouble breathing. You need reliever medicines more than 2-3 times a week. Your peak flow reading is still at 50-79% of your personal best after following the action plan for 1 hour. You have a fever. Get help right away if: You seem to be worse and are not responding to medicine during an asthma attack. You are short of breath even at rest. You get short of breath when doing very little activity. You have trouble eating, drinking, or talking. You have chest  pain or tightness. You have a fast heartbeat. Your lips or fingernails start to turn blue. You are light-headed or dizzy, or you faint. Your peak flow is less than 50% of your personal best. You feel too tired to breathe normally. These symptoms may be an  emergency. Get help right away. Call 911. Do not wait to see if the symptoms will go away. Do not drive yourself to the hospital. Summary Asthma is a long-term (chronic) condition in which the airways get tight and narrow. An asthma attack can make it hard to breathe. Asthma cannot be cured, but medicines and lifestyle changes can help control it. Make sure you understand how to avoid triggers and how and when to use your medicines. Avoid things that can cause allergy symptoms (allergens). These include animal skin flakes (dander) and pollen from trees or grass. Avoid things that pollute the air. These may include household cleaners, wood smoke, smog, or chemical odors. This information is not intended to replace advice given to you by your health care provider. Make sure you discuss any questions you have with your health care provider. Document Revised: 05/05/2021 Document Reviewed: 05/05/2021 Elsevier Patient Education  2024 [employer].

## 2023-12-06 NOTE — Progress Notes (Signed)
 [facility] [address] [place], [place] [address]  Pulmonary Sleep Medicine   Office Visit Note  Patient Name: Vanessa Evans DOB: June 26, 1946 MRN [id number]  Date of Service: 12/06/2023  Complaints/HPI: She was recently in the hospital in Feb for pneumonia. She also had A fib and now is supposed to see cardiology. She is on medical therapy for the A fib. She has better breathing now. She also is on diuretics.  As far as her breathing is concerned she is at baseline.  She does not have any cough she does get occasionally get some shortness breath with exertion.  A may actually be mediated by her underlying cardiac issues.  Spirometry was done showing an FEV1 is 1.28 L at this time  Office Spirometry Results: Peak Flow: (!) 3 L/min FEV1: 1.28 liters FVC: 1.58 liters FEV1/FVC: 81 % FVC  % Predicted: 69 % FEV % Predicted: 75 % FeF 25-75: 1.46 liters FeF 25-75 % Predicted: 113   ROS  General: (-) fever, (-) chills, (-) night sweats, (-) weakness Skin: (-) rashes, (-) itching,. Eyes: (-) visual changes, (-) redness, (-) itching. Nose and Sinuses: (-) nasal stuffiness or itchiness, (-) postnasal drip, (-) nosebleeds, (-) sinus trouble. Mouth and Throat: (-) sore throat, (-) hoarseness. Neck: (-) swollen glands, (-) enlarged thyroid , (-) neck pain. Respiratory: + cough, (-) bloody sputum, + shortness of breath, - wheezing. Cardiovascular: - ankle swelling, (-) chest pain. Lymphatic: (-) lymph node enlargement. Neurologic: (-) numbness, (-) tingling. Psychiatric: (-) anxiety, (-) depression   Current Medication: Outpatient Encounter Medications as of 12/06/2023  Medication Sig Note   acetaminophen  (TYLENOL ) 500 MG tablet Take 1,000 mg by mouth 2 (two) times daily as needed for moderate pain.    acidophilus (RISAQUAD) CAPS capsule Take 1 capsule by mouth daily.    albuterol  (VENTOLIN  HFA) 108 (90 Base) MCG/ACT inhaler Inhale 2 puffs into the lungs every 6 (six)  hours as needed for wheezing or shortness of breath.    apixaban  (ELIQUIS ) 5 MG TABS tablet Take 1 tablet by mouth 2 (two) times daily.    aspirin  EC 81 MG tablet Take 81 mg by mouth at bedtime.     benzonatate  (TESSALON ) 200 MG capsule Take 1 capsule (200 mg total) by mouth 3 (three) times daily as needed for cough.    bisacodyl  (DULCOLAX) 5 MG EC tablet Take 1 tablet (5 mg total) by mouth at bedtime as needed for moderate constipation.    BLACK ELDERBERRY PO Take 1 Dose by mouth 2 (two) times a day.    Calcium  Carbonate-Vitamin D  600-400 MG-UNIT tablet Take 1 tablet by mouth daily.    cholecalciferol  (VITAMIN D ) 1000 UNITS tablet Take 1,000 Units by mouth at bedtime.     citalopram  (CELEXA ) 20 MG tablet Take 1 tablet (20 mg total) by mouth daily.    cyanocobalamin  500 MCG tablet Take 500 mcg by mouth daily.    diclofenac  Sodium (VOLTAREN ) 1 % GEL Apply 4 g topically 4 (four) times daily. (Patient taking differently: Apply 4 g topically 4 (four) times daily as needed.)    diltiazem  (CARDIZEM  CD) 180 MG 24 hr capsule Take 1 capsule (180 mg total) by mouth daily. 10/01/2023: Patient states taking 120 mg 1 x day   diphenhydrAMINE  (BENADRYL ) 25 MG tablet Take 25-50 mg by mouth daily as needed for itching.    EPINEPHrine  (EPIPEN  2-PAK) 0.3 mg/0.3 mL IJ SOAJ injection use as directed for severe allergy reaction    famotidine  (  PEPCID ) 20 MG tablet Take 20 mg by mouth 2 (two) times daily.    fenofibrate  160 MG tablet Take 1 tablet (160 mg total) by mouth daily. for cholesterol    fluticasone  (FLONASE ) 50 MCG/ACT nasal spray Use 2 sprays in each nostril daily,    fluticasone  furoate-vilanterol (BREO ELLIPTA ) 100-25 MCG/ACT AEPB Inhale 1 puff into the lungs daily.    guaiFENesin  (MUCINEX ) 600 MG 12 hr tablet Take 600 mg by mouth at bedtime as needed (congestion).    Hypromellose (ARTIFICIAL TEARS OP) Place 1-2 drops into both eyes daily as needed (dry eyes).    ipratropium-albuterol  (DUONEB) 0.5-2.5 (3)  MG/3ML SOLN Take 3 mLs by nebulization every 6 (six) hours as needed.    ketotifen  (ZADITOR ) 0.025 % ophthalmic solution Place 1 drop into both eyes 2 (two) times daily as needed (allergies).    levocetirizine (XYZAL ) 5 MG tablet Take 1 tablet (5 mg total) by mouth every evening.    levothyroxine  (SYNTHROID , LEVOTHROID) 50 MCG tablet Take 50 mcg by mouth daily before breakfast. Brand Name only    lidocaine  (XYLOCAINE ) 5 % ointment Apply 1 Application topically as needed.    losartan  (COZAAR ) 25 MG tablet Take 25 mg by mouth daily.    Magnesium  250 MG TABS Take 1 tablet by mouth daily.    Melatonin 5 MG TABS Take 1 tablet by mouth at bedtime. For sleep    methocarbamol  (ROBAXIN ) 500 MG tablet TAKE ONE-HALF TO 1 TABLET TWICE DAILY AS NEEDED.    metoprolol  succinate (TOPROL -XL) 25 MG 24 hr tablet Take 1 tablet (25 mg total) by mouth 2 (two) times daily. 10/01/2023: Patient states taking 1/2 tablet in the morning 1/2 tablet in the evening.    Multiple Vitamins-Minerals (MULTIVITAMIN WITH MINERALS) tablet Take 1 tablet by mouth daily.    nystatin  (MYCOSTATIN ) 100000 UNIT/ML suspension Take 5 mLs (500,000 Units total) by mouth 4 (four) times daily.    nystatin  (MYCOSTATIN /NYSTOP ) powder Apply 1 Application topically 3 (three) times daily.    OXYGEN  Inhale into the lungs. 2 litre at night    pantoprazole  (PROTONIX ) 40 MG tablet Take 1 tablet (40 mg total) by mouth daily. 10/01/2023: 1 tablet 2 x per day   polyethylene glycol powder (GLYCOLAX /MIRALAX ) 17 GM/SCOOP powder Take 17 g by mouth daily.    potassium chloride  SA (KLOR-CON  M) 20 MEQ tablet Take 20 mEq by mouth daily as needed.    predniSONE  (STERAPRED UNI-PAK 48 TAB) 10 MG (48) TBPK tablet Take as directed    raloxifene  (EVISTA ) 60 MG tablet Take 60 mg by mouth daily.    rosuvastatin  (CRESTOR ) 5 MG tablet Take 1 tablet (5 mg total) by mouth daily.    sodium chloride  (OCEAN) 0.65 % SOLN nasal spray Place 1 spray into both nostrils as needed for  congestion.    spironolactone (ALDACTONE) 25 MG tablet Take 25 mg by mouth once.    torsemide (DEMADEX) 20 MG tablet Take 20 mg by mouth daily as needed. 10/01/2023: Patient states takes differently. Takes if weight gain of 2 lbs overnight, 3-4 pound in 1-2 days   Vitamin A 7.5 MG (25000 UT) CAPS Take 1 tablet by mouth 2 (two) times daily.    No facility-administered encounter medications on file as of 12/06/2023.    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 cope.  all benign.  inflammatory process going on in bladder   BREAST BIOPSY Right    benign   BREAST SURGERY Right    lumpectomy   CATARACT EXTRACTION W/PHACO Left 12/25/2014   Procedure: CATARACT EXTRACTION PHACO AND INTRAOCULAR LENS PLACEMENT (IOC);  Surgeon: Clair Crews, MD;  Location: [facility] ORS;  Service: Ophthalmology;  Laterality: Left;  US  00:32 AP% 20.0 CDE 6.49   COLONOSCOPY W/ BIOPSIES     COLONOSCOPY W/ POLYPECTOMY  2002, 2004, 2005   adenomatous polyps removed   COLONOSCOPY WITH PROPOFOL  N/A 08/23/2017   Procedure: COLONOSCOPY WITH PROPOFOL ;  Surgeon: Cassie Click, MD;  Location: [facility] ENDOSCOPY;  Service: Endoscopy;  Laterality: N/A;   COLONOSCOPY WITH PROPOFOL  N/A 11/08/2017   Procedure: COLONOSCOPY WITH PROPOFOL ;  Surgeon: Cassie Click, MD;  Location: [facility] ENDOSCOPY;  Service: Endoscopy;  Laterality: N/A;   COLONOSCOPY WITH PROPOFOL  N/A 06/30/2021   Procedure: COLONOSCOPY WITH PROPOFOL ;  Surgeon: Shane Darling, MD;  Location: [facility] ENDOSCOPY;  Service: Endoscopy;  Laterality: N/A;   CYSTOCELE REPAIR N/A 04/09/2016   Procedure: ANTERIOR REPAIR (CYSTOCELE);  Surgeon: Alben Alma, MD;  Location: [facility] ORS;  Service: Gynecology;  Laterality: N/A;   DIAGNOSTIC LAPAROSCOPY  2008   removed both ovaries with cysts,  tubes and fibroids   DILATION AND CURETTAGE OF UTERUS  1973   ESOPHAGOGASTRODUODENOSCOPY (EGD) WITH PROPOFOL  N/A 08/23/2017   Procedure: ESOPHAGOGASTRODUODENOSCOPY (EGD) WITH PROPOFOL ;  Surgeon: Cassie Click, MD;  Location: [facility] ENDOSCOPY;  Service: Endoscopy;  Laterality: N/A;   ESOPHAGOGASTRODUODENOSCOPY (EGD) WITH PROPOFOL  N/A 06/30/2021   Procedure: ESOPHAGOGASTRODUODENOSCOPY (EGD) WITH PROPOFOL ;  Surgeon: Shane Darling, MD;  Location: [facility] ENDOSCOPY;  Service: Endoscopy;  Laterality: N/A;   EYE SURGERY Bilateral 2016   HERNIA REPAIR Right 2008   Inguinal hernia Repair, ventral hernia repair   IR KYPHO THORACIC WITH BONE BIOPSY  05/12/2022   IR RADIOLOGIST EVAL & MGMT  05/19/2022   IR RADIOLOGIST EVAL & MGMT  05/26/2022   JOINT REPLACEMENT Right 2008   knee   KNEE ARTHROSCOPY Right    LUMBAR LAMINECTOMY/DECOMPRESSION MICRODISCECTOMY Right 03/21/2013   Procedure: Right Lumbar five-Sacral one Laminectomy for Synovial Cyst;  Surgeon: Augustine Blocker, MD;  Location: [facility] NEURO ORS;  Service: Neurosurgery;  Laterality: Right;  right   OOPHORECTOMY     REVERSE SHOULDER ARTHROPLASTY Right 02/22/2018   Procedure: REVERSE SHOULDER ARTHROPLASTY;  Surgeon: Elner Hahn, MD;  Location: [facility] ORS;  Service: Orthopedics;  Laterality: Right;   TUBAL LIGATION     UNILATERAL SALPINGECTOMY Left 04/09/2016   Procedure: UNILATERAL SALPINGECTOMY;  Surgeon: Alben Alma, MD;  Location: [facility] ORS;  Service: Gynecology;  Laterality: Left;   UPPER GI ENDOSCOPY  2010   with biopsy of gastric erosion   VAGINAL HYSTERECTOMY N/A 04/09/2016   Procedure: HYSTERECTOMY VAGINAL;  Surgeon: Alben Alma, MD;  Location: [facility] ORS;  Service: Gynecology;  Laterality: N/A;   VAGINAL HYSTERECTOMY  2017   and bladder tack    Medical History: Past Medical History:  Diagnosis Date   Arthritis    osteo   Asthma    needs rescue inhaler few times a year   Claustrophobia 04/21/2020   Depression    Dizziness     light headed spells but it has been a while   Dysrhythmia    tachycardia.(cardizem ). brady during procedures   Fibromyalgia    GERD (gastroesophageal reflux disease)  gastritis   Headache(784.0)    Heart murmur 2019   aortic. dr. Bary Likes not worried about this   Hyperlipidemia    Hypertension    Hypothyroidism    Macular degeneration    Migraine    Orthopnea    Pneumonia    long time ago (greater than 5 years ago)   PONV (postoperative nausea and vomiting)    very anxious during cataract surgery. brady during colonoscopy   Shortness of breath    any exertion, cannot lie flat    Family History: Family History  Problem Relation Age of Onset   Pulmonary fibrosis Mother    Melanoma Father    COPD Sister    Cardiomyopathy Sister        and arrhythmia   Atrial fibrillation Sister    COPD Brother    Pulmonary fibrosis Brother    Cancer Brother    Breast cancer Cousin        paternal 1st    Social History: Social History   Socioeconomic History   Marital status: Married    Spouse name: Not on file   Number of children: Not on file   Years of education: Not on file   Highest education level: Not on file  Occupational History   Not on file  Tobacco Use   Smoking status: Former    Current packs/day: 0.00    Types: Cigarettes    Quit date: 1967    Years since quitting: 58.3   Smokeless tobacco: Never  Vaping Use   Vaping status: Never Used  Substance and Sexual Activity   Alcohol  use: Yes    Alcohol /week: 1.0 standard drink of alcohol     Types: 1 Glasses of wine per week    Comment: occasionally   Drug use: No   Sexual activity: Not Currently  Other Topics Concern   Not on file  Social History Narrative   Not on file   Social Drivers of Health   Financial Resource Strain: Not on file  Food Insecurity: No Food Insecurity (10/01/2023)   Hunger Vital Sign    Worried About Running Out of Food in the Last Year: Never true    Ran Out of Food in the Last  Year: Never true  Transportation Needs: No Transportation Needs (10/01/2023)   PRAPARE - Administrator, Civil Service (Medical): No    Lack of Transportation (Non-Medical): No  Physical Activity: Not on file  Stress: Not on file  Social Connections: Unknown (09/12/2023)   Social Connection and Isolation Panel [NHANES]    Frequency of Communication with Friends and Family: More than three times a week    Frequency of Social Gatherings with Friends and Family: More than three times a week    Attends Religious Services: More than 4 times per year    Active Member of [employer] or Organizations: Patient unable to answer    Attends Banker Meetings: Patient unable to answer    Marital Status: Married  Catering manager Violence: Not At Risk (10/01/2023)   Humiliation, Afraid, Rape, and Kick questionnaire    Fear of Current or Ex-Partner: No    Emotionally Abused: No    Physically Abused: No    Sexually Abused: No    Vital Signs: Blood pressure 110/70, pulse 87, temperature 98.3 F (36.8 C), resp. rate 16, height 5\' 1"  (1.549 m), weight 143 lb 6.4 oz (65 kg), SpO2 97%, peak flow (!) 3 L/min.  Examination: General Appearance: The  patient is well-developed, well-nourished, and in no distress. Skin: Gross inspection of skin unremarkable. Head: normocephalic, no gross deformities. Eyes: no gross deformities noted. ENT: ears appear grossly normal no exudates. Neck: Supple. No thyromegaly. No LAD. Respiratory: no rhonchin oted. Cardiovascular: Normal S1 and S2 without murmur or rub. Extremities: No cyanosis. pulses are equal. Neurologic: Alert and oriented. No involuntary movements.  LABS: Recent Results (from the past 2160 hours)  Resp panel by RT-PCR (RSV, Flu A&B, Covid) Anterior Nasal Swab     Status: Abnormal   Collection Time: 09/10/23  1:30 PM   Specimen: Anterior Nasal Swab  Result Value Ref Range   SARS Coronavirus 2 by RT PCR NEGATIVE NEGATIVE    Comment:  (NOTE) SARS-CoV-2 target nucleic acids are NOT DETECTED.  The SARS-CoV-2 RNA is generally detectable in upper respiratory specimens during the acute phase of infection. The lowest concentration of SARS-CoV-2 viral copies this assay can detect is 138 copies/mL. A negative result does not preclude SARS-Cov-2 infection and should not be used as the sole basis for treatment or other patient management decisions. A negative result may occur with  improper specimen collection/handling, submission of specimen other than nasopharyngeal swab, presence of viral mutation(s) within the areas targeted by this assay, and inadequate number of viral copies(<138 copies/mL). A negative result must be combined with clinical observations, patient history, and epidemiological information. The expected result is Negative.  Fact Sheet for Patients:  BloggerCourse.com  Fact Sheet for Healthcare Providers:  SeriousBroker.it  This test is no t yet approved or cleared by the [place]  FDA and  has been authorized for detection and/or diagnosis of SARS-CoV-2 by FDA under an Emergency Use Authorization (EUA). This EUA will remain  in effect (meaning this test can be used) for the duration of the COVID-19 declaration under Section 564(b)(1) of the Act, 21 U.S.C.section 360bbb-3(b)(1), unless the authorization is terminated  or revoked sooner.       Influenza A by PCR POSITIVE (A) NEGATIVE   Influenza B by PCR NEGATIVE NEGATIVE    Comment: (NOTE) The Xpert Xpress SARS-CoV-2/FLU/RSV plus assay is intended as an aid in the diagnosis of influenza from Nasopharyngeal swab specimens and should not be used as a sole basis for treatment. Nasal washings and aspirates are unacceptable for Xpert Xpress SARS-CoV-2/FLU/RSV testing.  Fact Sheet for Patients: BloggerCourse.com  Fact Sheet for Healthcare  Providers: SeriousBroker.it  This test is not yet approved or cleared by the [place]  FDA and has been authorized for detection and/or diagnosis of SARS-CoV-2 by FDA under an Emergency Use Authorization (EUA). This EUA will remain in effect (meaning this test can be used) for the duration of the COVID-19 declaration under Section 564(b)(1) of the Act, 21 U.S.C. section 360bbb-3(b)(1), unless the authorization is terminated or revoked.     Resp Syncytial Virus by PCR NEGATIVE NEGATIVE    Comment: (NOTE) Fact Sheet for Patients: BloggerCourse.com  Fact Sheet for Healthcare Providers: SeriousBroker.it  This test is not yet approved or cleared by the [place]  FDA and has been authorized for detection and/or diagnosis of SARS-CoV-2 by FDA under an Emergency Use Authorization (EUA). This EUA will remain in effect (meaning this test can be used) for the duration of the COVID-19 declaration under Section 564(b)(1) of the Act, 21 U.S.C. section 360bbb-3(b)(1), unless the authorization is terminated or revoked.  Performed at [facility], [address] [place], [place] [address]   Basic metabolic panel     Status: Abnormal  Collection Time: 09/10/23  1:43 PM  Result Value Ref Range   Sodium 134 (L) 135 - 145 mmol/L   Potassium 3.9 3.5 - 5.1 mmol/L   Chloride 96 (L) 98 - 111 mmol/L   CO2 26 22 - 32 mmol/L   Glucose, Bld 101 (H) 70 - 99 mg/dL    Comment: Glucose reference range applies only to samples taken after fasting for at least 8 hours.   BUN 13 8 - 23 mg/dL   Creatinine, Ser 5.18 0.44 - 1.00 mg/dL   Calcium  8.8 (L) 8.9 - 10.3 mg/dL   GFR, Estimated >84 >16 mL/min    Comment: (NOTE) Calculated using the CKD-EPI Creatinine Equation (2021)    Anion gap 12 5 - 15    Comment: Performed at [facility], [address] [place], [place] [address]  CBC     Status: None    Collection Time: 09/10/23  1:43 PM  Result Value Ref Range   WBC 5.8 4.0 - 10.5 K/uL   RBC 4.09 3.87 - 5.11 MIL/uL   Hemoglobin 12.4 12.0 - 15.0 g/dL   HCT 16.0 10.9 - 32.3 %   MCV 93.9 80.0 - 100.0 fL   MCH 30.3 26.0 - 34.0 pg   MCHC 32.3 30.0 - 36.0 g/dL   RDW 55.7 32.2 - 02.5 %   Platelets 223 150 - 400 K/uL   nRBC 0.0 0.0 - 0.2 %    Comment: Performed at [facility], [address], [place], [place] [address]  Troponin I (High Sensitivity)     Status: None   Collection Time: 09/10/23  1:43 PM  Result Value Ref Range   Troponin I (High Sensitivity) 8 <18 ng/L    Comment: (NOTE) Elevated high sensitivity troponin I (hsTnI) values and significant  changes across serial measurements may suggest ACS but many other  chronic and acute conditions are known to elevate hsTnI results.  Refer to the "Links" section for chest pain algorithms and additional  guidance. Performed at [facility], [address] [place], [place] [address]   Procalcitonin     Status: None   Collection Time: 09/10/23  1:43 PM  Result Value Ref Range   Procalcitonin <0.10 ng/mL    Comment:        Interpretation: PCT (Procalcitonin) <= 0.5 ng/mL: Systemic infection (sepsis) is not likely. Local bacterial infection is possible. (NOTE)       Sepsis PCT Algorithm           Lower Respiratory Tract                                      Infection PCT Algorithm    ----------------------------     ----------------------------         PCT < 0.25 ng/mL                PCT < 0.10 ng/mL          Strongly encourage             Strongly discourage   discontinuation of antibiotics    initiation of antibiotics    ----------------------------     -----------------------------       PCT 0.25 - 0.50 ng/mL            PCT 0.10 - 0.25 ng/mL  OR       >80% decrease in PCT            Discourage initiation of                                            antibiotics      Encourage  discontinuation           of antibiotics    ----------------------------     -----------------------------         PCT >= 0.50 ng/mL              PCT 0.26 - 0.50 ng/mL               AND        <80% decrease in PCT             Encourage initiation of                                             antibiotics       Encourage continuation           of antibiotics    ----------------------------     -----------------------------        PCT >= 0.50 ng/mL                  PCT > 0.50 ng/mL               AND         increase in PCT                  Strongly encourage                                      initiation of antibiotics    Strongly encourage escalation           of antibiotics                                     -----------------------------                                           PCT <= 0.25 ng/mL                                                 OR                                        > 80% decrease in PCT                                      Discontinue / Do not initiate  antibiotics  Performed at [facility], [address] [place], [place] [address]   TSH     Status: None   Collection Time: 09/10/23  1:43 PM  Result Value Ref Range   TSH 1.447 0.350 - 4.500 uIU/mL    Comment: Performed by a 3rd Generation assay with a functional sensitivity of <=0.01 uIU/mL. Performed at [facility], [address] [place], [place] [address]   Lactic acid, plasma     Status: None   Collection Time: 09/10/23  4:56 PM  Result Value Ref Range   Lactic Acid, Venous 0.8 0.5 - 1.9 mmol/L    Comment: Performed at [facility], [address] [place], [place] [address]  Blood culture (routine x 2)     Status: None   Collection Time: 09/10/23  4:58 PM   Specimen: BLOOD  Result Value Ref Range   Specimen Description BLOOD BLOOD LEFT HAND    Special Requests      BOTTLES DRAWN AEROBIC AND ANAEROBIC Blood Culture  adequate volume   Culture      NO GROWTH 5 DAYS Performed at [facility], [address], [place], [place] [address]    Report Status 09/15/2023 FINAL   Blood culture (routine x 2)     Status: None   Collection Time: 09/10/23  5:02 PM   Specimen: BLOOD  Result Value Ref Range   Specimen Description BLOOD BLOOD RIGHT HAND    Special Requests      BOTTLES DRAWN AEROBIC AND ANAEROBIC Blood Culture adequate volume   Culture      NO GROWTH 5 DAYS Performed at [facility], [address] [place], [place] [address]    Report Status 09/15/2023 FINAL   Troponin I (High Sensitivity)     Status: Abnormal   Collection Time: 09/10/23 10:17 PM  Result Value Ref Range   Troponin I (High Sensitivity) 38 (H) <18 ng/L    Comment: (NOTE) Elevated high sensitivity troponin I (hsTnI) values and significant  changes across serial measurements may suggest ACS but many other  chronic and acute conditions are known to elevate hsTnI results.  Refer to the "Links" section for chest pain algorithms and additional  guidance. Performed at [facility], [address] [place], [place] [address]   Magnesium      Status: None   Collection Time: 09/11/23  3:06 AM  Result Value Ref Range   Magnesium  2.1 1.7 - 2.4 mg/dL    Comment: Performed at [facility], [address] [place], [place] [address]  Phosphorus     Status: None   Collection Time: 09/11/23  3:06 AM  Result Value Ref Range   Phosphorus 2.8 2.5 - 4.6 mg/dL    Comment: Performed at [facility], [address] [place], [place] [address]  HIV Antibody (routine testing w rflx)     Status: None   Collection Time: 09/11/23  3:07 AM  Result Value Ref Range   HIV Screen 4th Generation wRfx Non Reactive Non Reactive    Comment: Performed at [facility], [address] [place], [place] [address]  CBC with Differential/Platelet     Status: Abnormal   Collection Time: 09/11/23  3:07 AM   Result Value Ref Range   WBC 2.9 (L) 4.0 - 10.5 K/uL   RBC 3.51 (L) 3.87 - 5.11 MIL/uL   Hemoglobin 10.7 (L) 12.0 - 15.0 g/dL   HCT 53.6 (L) 64.4 - 03.4 %  MCV 92.9 80.0 - 100.0 fL   MCH 30.5 26.0 - 34.0 pg   MCHC 32.8 30.0 - 36.0 g/dL   RDW 16.1 09.6 - 04.5 %   Platelets 191 150 - 400 K/uL   nRBC 0.0 0.0 - 0.2 %   Neutrophils Relative % 82 %   Neutro Abs 2.3 1.7 - 7.7 K/uL   Lymphocytes Relative 13 %   Lymphs Abs 0.4 (L) 0.7 - 4.0 K/uL   Monocytes Relative 5 %   Monocytes Absolute 0.2 0.1 - 1.0 K/uL   Eosinophils Relative 0 %   Eosinophils Absolute 0.0 0.0 - 0.5 K/uL   Basophils Relative 0 %   Basophils Absolute 0.0 0.0 - 0.1 K/uL   Immature Granulocytes 0 %   Abs Immature Granulocytes 0.01 0.00 - 0.07 K/uL    Comment: Performed at [facility], [address], [place], [place] [address]  Basic metabolic panel     Status: Abnormal   Collection Time: 09/11/23  3:07 AM  Result Value Ref Range   Sodium 138 135 - 145 mmol/L   Potassium 3.5 3.5 - 5.1 mmol/L   Chloride 103 98 - 111 mmol/L   CO2 25 22 - 32 mmol/L   Glucose, Bld 179 (H) 70 - 99 mg/dL    Comment: Glucose reference range applies only to samples taken after fasting for at least 8 hours.   BUN 11 8 - 23 mg/dL   Creatinine, Ser 1.91 0.44 - 1.00 mg/dL   Calcium  8.3 (L) 8.9 - 10.3 mg/dL   GFR, Estimated >47 >82 mL/min    Comment: (NOTE) Calculated using the CKD-EPI Creatinine Equation (2021)    Anion gap 10 5 - 15    Comment: Performed at [facility], [address] [place], [place] [address]  Basic metabolic panel     Status: Abnormal   Collection Time: 09/12/23  5:08 AM  Result Value Ref Range   Sodium 137 135 - 145 mmol/L   Potassium 3.5 3.5 - 5.1 mmol/L   Chloride 102 98 - 111 mmol/L   CO2 23 22 - 32 mmol/L   Glucose, Bld 138 (H) 70 - 99 mg/dL    Comment: Glucose reference range applies only to samples taken after fasting for at least 8 hours.   BUN 12 8 - 23 mg/dL   Creatinine, Ser  3.08 0.44 - 1.00 mg/dL   Calcium  8.6 (L) 8.9 - 10.3 mg/dL   GFR, Estimated >65 >78 mL/min    Comment: (NOTE) Calculated using the CKD-EPI Creatinine Equation (2021)    Anion gap 12 5 - 15    Comment: Performed at [facility], [address] [place], [place] [address]  CBC     Status: Abnormal   Collection Time: 09/12/23  5:08 AM  Result Value Ref Range   WBC 7.1 4.0 - 10.5 K/uL   RBC 3.48 (L) 3.87 - 5.11 MIL/uL   Hemoglobin 10.5 (L) 12.0 - 15.0 g/dL   HCT 95.2 (L) 84.1 - 32.4 %   MCV 94.0 80.0 - 100.0 fL   MCH 30.2 26.0 - 34.0 pg   MCHC 32.1 30.0 - 36.0 g/dL   RDW 40.1 02.7 - 25.3 %   Platelets 197 150 - 400 K/uL   nRBC 0.0 0.0 - 0.2 %    Comment: Performed at [facility], [address], [place], [place] [address]  Magnesium      Status: None   Collection Time: 09/12/23  5:08 AM  Result Value Ref Range   Magnesium  2.2 1.7 - 2.4 mg/dL    Comment: Performed at [facility], [address] [place], [place] [address]  Phosphorus     Status: None   Collection Time: 09/12/23  5:08 AM  Result Value Ref Range   Phosphorus 2.7 2.5 - 4.6 mg/dL    Comment: Performed at [facility] [address] [place], [place] [address]  ECHOCARDIOGRAM COMPLETE     Status: None   Collection Time: 09/12/23  4:06 PM  Result Value Ref Range   Weight 2,377.62 oz   Height 61 in   BP 135/76 mmHg   Ao pk vel 1.37 m/s   AR max vel 2.30 cm2   AV Peak grad 7.5 mmHg   S' Lateral 2.60 cm   Area-P 1/2 3.53 cm2   Est EF 55 - 60%   Basic metabolic panel     Status: Abnormal   Collection Time: 09/13/23  4:52 AM  Result Value Ref Range   Sodium 140 135 - 145 mmol/L   Potassium 3.4 (L) 3.5 - 5.1 mmol/L   Chloride 104 98 - 111 mmol/L   CO2 26 22 - 32 mmol/L   Glucose, Bld 120 (H) 70 - 99 mg/dL    Comment: Glucose reference range applies only to samples taken after fasting for at least 8 hours.   BUN 13 8 - 23 mg/dL   Creatinine, Ser 0.86 0.44 - 1.00 mg/dL    Calcium  8.6 (L) 8.9 - 10.3 mg/dL   GFR, Estimated >57 >84 mL/min    Comment: (NOTE) Calculated using the CKD-EPI Creatinine Equation (2021)    Anion gap 10 5 - 15    Comment: Performed at [facility], [address] [place], [place] [address]  CBC     Status: Abnormal   Collection Time: 09/13/23  4:52 AM  Result Value Ref Range   WBC 9.0 4.0 - 10.5 K/uL   RBC 3.74 (L) 3.87 - 5.11 MIL/uL   Hemoglobin 11.4 (L) 12.0 - 15.0 g/dL   HCT 52.8 (L) 41.3 - 24.4 %   MCV 91.2 80.0 - 100.0 fL   MCH 30.5 26.0 - 34.0 pg   MCHC 33.4 30.0 - 36.0 g/dL   RDW 01.0 27.2 - 53.6 %   Platelets 246 150 - 400 K/uL   nRBC 0.0 0.0 - 0.2 %    Comment: Performed at [facility], [address], [place], [place] [address]  Magnesium      Status: None   Collection Time: 09/13/23  4:52 AM  Result Value Ref Range   Magnesium  2.1 1.7 - 2.4 mg/dL    Comment: Performed at [facility], [address] [place], [place] [address]  Phosphorus     Status: None   Collection Time: 09/13/23  4:52 AM  Result Value Ref Range   Phosphorus 2.6 2.5 - 4.6 mg/dL    Comment: Performed at [facility], [address] [place], [place] [address]  Basic metabolic panel     Status: Abnormal   Collection Time: 09/14/23  5:24 AM  Result Value Ref Range   Sodium 136 135 - 145 mmol/L   Potassium 4.1 3.5 - 5.1 mmol/L   Chloride 101 98 - 111 mmol/L   CO2 26 22 - 32 mmol/L   Glucose, Bld 121 (H) 70 - 99 mg/dL    Comment: Glucose reference range applies only to samples taken after fasting for at least 8  hours.   BUN 12 8 - 23 mg/dL   Creatinine, Ser 1.47 0.44 - 1.00 mg/dL   Calcium  8.9 8.9 - 10.3 mg/dL   GFR, Estimated >82 >95 mL/min    Comment: (NOTE) Calculated using the CKD-EPI Creatinine Equation (2021)    Anion gap 9 5 - 15    Comment: Performed at [facility], [address] [place], [place] [address]  CBC     Status: None   Collection Time: 09/14/23  5:24 AM  Result  Value Ref Range   WBC 8.3 4.0 - 10.5 K/uL   RBC 4.13 3.87 - 5.11 MIL/uL   Hemoglobin 12.3 12.0 - 15.0 g/dL   HCT 86.5 78.4 - 69.6 %   MCV 90.6 80.0 - 100.0 fL   MCH 29.8 26.0 - 34.0 pg   MCHC 32.9 30.0 - 36.0 g/dL   RDW 29.5 28.4 - 13.2 %   Platelets 312 150 - 400 K/uL   nRBC 0.0 0.0 - 0.2 %    Comment: Performed at [facility], [address] [place], [place] [address]  Magnesium      Status: None   Collection Time: 09/14/23  5:24 AM  Result Value Ref Range   Magnesium  2.4 1.7 - 2.4 mg/dL    Comment: Performed at [facility], [address] [place], [place] [address]  Phosphorus     Status: None   Collection Time: 09/14/23  5:24 AM  Result Value Ref Range   Phosphorus 3.2 2.5 - 4.6 mg/dL    Comment: Performed at [facility], [address] [place], [place] [address]  Basic metabolic panel     Status: Abnormal   Collection Time: 09/15/23  4:41 AM  Result Value Ref Range   Sodium 133 (L) 135 - 145 mmol/L   Potassium 3.6 3.5 - 5.1 mmol/L   Chloride 96 (L) 98 - 111 mmol/L   CO2 27 22 - 32 mmol/L   Glucose, Bld 126 (H) 70 - 99 mg/dL    Comment: Glucose reference range applies only to samples taken after fasting for at least 8 hours.   BUN 18 8 - 23 mg/dL   Creatinine, Ser 3.47 0.44 - 1.00 mg/dL   Calcium  9.2 8.9 - 10.3 mg/dL   GFR, Estimated >42 >59 mL/min    Comment: (NOTE) Calculated using the CKD-EPI Creatinine Equation (2021)    Anion gap 10 5 - 15    Comment: Performed at [facility], [address] [place], [place] [address]  CBC     Status: None   Collection Time: 09/15/23  4:41 AM  Result Value Ref Range   WBC 10.1 4.0 - 10.5 K/uL   RBC 4.26 3.87 - 5.11 MIL/uL   Hemoglobin 12.9 12.0 - 15.0 g/dL   HCT 56.4 33.2 - 95.1 %   MCV 89.2 80.0 - 100.0 fL   MCH 30.3 26.0 - 34.0 pg   MCHC 33.9 30.0 - 36.0 g/dL   RDW 88.4 16.6 - 06.3 %   Platelets 399 150 - 400 K/uL   nRBC 0.0 0.0 - 0.2 %    Comment: Performed at [facility], [address] [place], [place] [address]  Basic metabolic panel     Status: Abnormal   Collection Time: 09/16/23  4:18 AM  Result Value Ref Range   Sodium 131 (L) 135 - 145 mmol/L   Potassium 3.5 3.5 - 5.1 mmol/L   Chloride 95 (L) 98 - 111 mmol/L  CO2 25 22 - 32 mmol/L   Glucose, Bld 138 (H) 70 - 99 mg/dL    Comment: Glucose reference range applies only to samples taken after fasting for at least 8 hours.   BUN 20 8 - 23 mg/dL   Creatinine, Ser 6.96 0.44 - 1.00 mg/dL   Calcium  8.9 8.9 - 10.3 mg/dL   GFR, Estimated >29 >52 mL/min    Comment: (NOTE) Calculated using the CKD-EPI Creatinine Equation (2021)    Anion gap 11 5 - 15    Comment: Performed at [facility], [address] [place], [place] [address]  CBC     Status: Abnormal   Collection Time: 09/16/23  4:18 AM  Result Value Ref Range   WBC 11.9 (H) 4.0 - 10.5 K/uL   RBC 4.45 3.87 - 5.11 MIL/uL   Hemoglobin 13.3 12.0 - 15.0 g/dL   HCT 44.0 10.2 - 72.5 %   MCV 87.9 80.0 - 100.0 fL   MCH 29.9 26.0 - 34.0 pg   MCHC 34.0 30.0 - 36.0 g/dL   RDW 36.6 44.0 - 34.7 %   Platelets 470 (H) 150 - 400 K/uL   nRBC 0.2 0.0 - 0.2 %    Comment: Performed at [facility], [address] [place], [place] [address]  Basic metabolic panel     Status: Abnormal   Collection Time: 09/17/23  4:38 AM  Result Value Ref Range   Sodium 134 (L) 135 - 145 mmol/L   Potassium 3.7 3.5 - 5.1 mmol/L   Chloride 98 98 - 111 mmol/L   CO2 26 22 - 32 mmol/L   Glucose, Bld 95 70 - 99 mg/dL    Comment: Glucose reference range applies only to samples taken after fasting for at least 8 hours.   BUN 19 8 - 23 mg/dL   Creatinine, Ser 6.38 0.44 - 1.00 mg/dL   Calcium  8.6 (L) 8.9 - 10.3 mg/dL   GFR, Estimated >75 >64 mL/min    Comment: (NOTE) Calculated using the CKD-EPI Creatinine Equation (2021)    Anion gap 10 5 - 15    Comment: Performed at [facility], [address] [place], [place] [address]  CBC      Status: Abnormal   Collection Time: 09/17/23  4:38 AM  Result Value Ref Range   WBC 9.4 4.0 - 10.5 K/uL   RBC 4.34 3.87 - 5.11 MIL/uL   Hemoglobin 13.2 12.0 - 15.0 g/dL   HCT 18.8 41.6 - 60.6 %   MCV 89.9 80.0 - 100.0 fL   MCH 30.4 26.0 - 34.0 pg   MCHC 33.8 30.0 - 36.0 g/dL   RDW 30.1 60.1 - 09.3 %   Platelets 484 (H) 150 - 400 K/uL   nRBC 0.0 0.0 - 0.2 %    Comment: Performed at [facility], [address] [place], [place] [address]  UA/M w/rflx Culture, Routine     Status: None   Collection Time: 10/28/23  3:09 PM   Specimen: Urine   Urine  Result Value Ref Range   Specific Gravity, UA 1.007 1.005 - 1.030   pH, UA 6.5 5.0 - 7.5   Color, UA Yellow Yellow   Appearance Ur Clear Clear   Leukocytes,UA Negative Negative   Protein,UA Negative Negative/Trace   Glucose, UA Negative Negative   Ketones, UA Negative Negative   RBC, UA Negative Negative   Bilirubin, UA Negative Negative   Urobilinogen, Ur 0.2 0.2 - 1.0 mg/dL  Nitrite, UA Negative Negative   Microscopic Examination Comment     Comment: Microscopic follows if indicated.   Microscopic Examination See below:     Comment: Microscopic was indicated and was performed.   Urinalysis Reflex Comment     Comment: This specimen will not reflex to a Urine Culture.  Microscopic Examination     Status: None   Collection Time: 10/28/23  3:09 PM   Urine  Result Value Ref Range   WBC, UA None seen 0 - 5 /hpf   RBC, Urine 0-2 0 - 2 /hpf   Epithelial Cells (non renal) None seen 0 - 10 /hpf   Casts None seen None seen /lpf   Bacteria, UA None seen None seen/Few  Iron and TIBC     Status: Abnormal   Collection Time: 11/10/23 10:35 AM  Result Value Ref Range   Iron 92 28 - 170 ug/dL   TIBC 301 (H) 601 - 093 ug/dL   Saturation Ratios 19 10.4 - 31.8 %   UIBC 381 ug/dL    Comment: Performed at [facility], [address] [place], [place] [address]  Ferritin     Status: Abnormal   Collection Time: 11/10/23  10:35 AM  Result Value Ref Range   Ferritin 397 (H) 11 - 307 ng/mL    Comment: Performed at [facility], [address] [place], [place] [address]  CBC     Status: None   Collection Time: 11/10/23 10:35 AM  Result Value Ref Range   WBC 6.0 4.0 - 10.5 K/uL   RBC 4.09 3.87 - 5.11 MIL/uL   Hemoglobin 12.2 12.0 - 15.0 g/dL   HCT 54.2 70.6 - 23.7 %   MCV 95.4 80.0 - 100.0 fL   MCH 29.8 26.0 - 34.0 pg   MCHC 31.3 30.0 - 36.0 g/dL   RDW 62.8 31.5 - 17.6 %   Platelets 315 150 - 400 K/uL   nRBC 0.0 0.0 - 0.2 %    Comment: Performed at [facility], [address] [place], [place] [address]    Radiology: DG Chest 2 View Result Date: 10/18/2023 CLINICAL DATA:  Rule out pneumonia. EXAM: CHEST - 2 VIEW COMPARISON:  Chest radiograph dated 09/17/2023 FINDINGS: No focal consolidation, pleural effusion, or pneumothorax. The cardiac silhouette is within normal limits. Osteopenia with degenerative changes spine. Lower thoracic old compression fracture and vertebroplasty. Right shoulder arthroplasty. No acute osseous pathology. IMPRESSION: No active cardiopulmonary disease. Electronically Signed   By: Angus Bark M.D.   On: 10/18/2023 15:41    No results found.  DG Foot Complete Right Result Date: 12/01/2023 Please see detailed radiograph report in office note.   Assessment and Plan: Patient Active Problem List   Diagnosis Date Noted   Acute hypoxic respiratory failure (HCC) 09/10/2023   Atrial fibrillation with RVR (HCC) 09/10/2023   Influenza A with pneumonia 09/10/2023   Asthma exacerbation 09/10/2023   (HFpEF) heart failure with preserved ejection fraction (HCC) 09/10/2023   Lymphedema 01/18/2022   Coronary artery disease involving native coronary artery of native heart with angina pectoris (HCC) 10/28/2020   Peptic ulcer disease 04/21/2020   Claustrophobia 04/21/2020   Urinary tract infection without hematuria 02/04/2020   Pain in both lower extremities  12/18/2019   Inflammatory polyarthritis (HCC) 07/26/2019   Excessive daytime sleepiness 07/26/2019   Encounter for screening mammogram for malignant neoplasm of breast 07/26/2019   Intercostal muscle pain 06/25/2019   Positive ANA (antinuclear antibody) 05/01/2019  Primary osteoarthritis involving multiple joints 04/26/2019   Mild intermittent asthma without complication 08/22/2018   Seasonal allergic rhinitis due to pollen 08/22/2018   Chills with fever 08/11/2018   Sore throat 08/11/2018   Acute upper respiratory infection 08/11/2018   Candidiasis 08/11/2018   Iron deficiency anemia 05/05/2018   Right hip pain 04/27/2018   Accidental fall from furniture 04/27/2018   Vitamin D  deficiency 04/27/2018   Cough 04/18/2018   Dysuria 04/18/2018   Status post reverse total shoulder replacement, right 02/22/2018   Abnormal ECG 02/18/2018   Pain in joint of right shoulder 02/13/2018   Abdominal aortic atherosclerosis (HCC) 02/13/2018   Episode of recurrent major depressive disorder (HCC) 02/13/2018   Encounter for general adult medical examination with abnormal findings 02/13/2018   Lipoma of right shoulder 01/31/2018   Rotator cuff tendinitis, right 01/31/2018   Chronic superficial gastritis without bleeding 12/12/2017   Schatzki's ring 12/12/2017   Acute gastritis 11/08/2017   Nausea 10/18/2017   Other fatigue 10/18/2017   Allergic rhinitis 09/20/2017   Eczema 09/20/2017   Hematuria 09/20/2017   Mixed hyperlipidemia 09/20/2017   Osteoarthritis 09/20/2017   Osteoporosis, post-menopausal 09/20/2017   Palpitations 07/05/2017   Epigastric pain 05/24/2017   Gastroesophageal reflux disease without esophagitis 05/24/2017   Impingement syndrome of left shoulder 10/02/2016   Trochanteric bursitis of left hip 10/02/2016   SOB (shortness of breath) 04/16/2016   Uterine prolapse 04/09/2016   Cystocele 04/09/2016   Hyponatremia 04/09/2016   Benign essential HTN 11/18/2015   Paroxysmal  supraventricular tachycardia (HCC) 11/18/2015   Premature ventricular contraction 07/20/2014   History of colonic polyps 05/29/2014   Asthma 02/05/2014   Chest pain 02/05/2014   Increased frequency of urination 02/05/2014   Tachycardia 02/05/2014   Female stress incontinence 12/14/2013   Gross hematuria 12/14/2013   Incomplete emptying of bladder 12/14/2013   Other chronic cystitis without hematuria 12/14/2013    1. SOB (shortness of breath) (Primary)  We did check spirometry her FEV1 is okay will continue with supportive care she does have albuterol  and she should continue to use that along with the [facility] - Spirometry with graph  2. Chronic asthma, mild intermittent, uncomplicated  She is on Breo as well as albuterol  which will be continued  3. Chronic heart failure with preserved ejection fraction (HCC)  Appears to be compensated will continue to monitor patient's fluid status   General Counseling: I have discussed the findings of the evaluation and examination with Codee.  I have also discussed any further diagnostic evaluation thatmay be needed or ordered today. Adonica verbalizes understanding of the findings of todays visit. We also reviewed her medications today and discussed drug interactions and side effects including but not limited excessive drowsiness and altered mental states. We also discussed that there is always a risk not just to her but also people around her. she has been encouraged to call the office with any questions or concerns that should arise related to todays visit.  Orders Placed This Encounter  Procedures   Spirometry with graph    Where should this test be performed?:   [facility]     Time spent: 38  I have personally obtained a history, examined the patient, evaluated laboratory and imaging results, formulated the assessment and plan and placed orders.    Cordie Deters, MD [facility] Pulmonary and Critical [facility] Sleep medicine

## 2023-12-07 NOTE — Progress Notes (Signed)
 Subjective:  Patient ID: Vanessa Evans, female    DOB: [birth year],  MRN: [id number]  Vanessa Evans presents to clinic today for painful elongated mycotic toenails 1-5 bilaterally which are tender when wearing enclosed shoe gear. Pain is relieved with periodic professional debridement.  Chief Complaint  Patient presents with   Nail Problem    "Trim my nails."   New problem(s): None.   PCP is McDonough, Lillie Reining, PA-C.  Allergies  Allergen Reactions   Codeine Shortness Of Breath and Itching    Bronchospasms and asthma attach   Levofloxacin     Altered mental status": drunk" feeling, confused, speech problems Other reaction(s): Unknown    Oxycodone  Itching and Shortness Of Breath    Would take benadryl  to eliminate itching. Has had bronchospasm   Ultram [Tramadol] Itching and Other (See Comments)    Bronchospasm and asthma attack   Nitrofurantoin Diarrhea    Tried again and had no problems with it Severe and long lasting   Nsaids Other (See Comments)    Bloody stools, abdominal pain, ulcer History of gastritis   Sulfacetamide Rash    Fever, abdominal pain   Adhesive [Tape] Other (See Comments)    Thin skin. Causes tears. Paper tape okay   Azithromycin      Unsure. Patient does not remember but thinks that it did not work for her.   Ciprofloxacin Other (See Comments)    Severe pain in neck and down back    Ketorolac Itching   Sulfa Antibiotics Itching, Rash and Other (See Comments)    fever   Tolmetin     Other reaction(s): Other (See Comments) Bloody stools, abdominal pain History of chronic gastritis   Zofran  [Ondansetron  Hcl] Other (See Comments)    constipation    Review of Systems: Negative except as noted in the HPI.  Objective: No changes noted in today's physical examination. There were no vitals filed for this visit. Vanessa Evans is a pleasant 78 y.o. female WD, WN in NAD. AAO x 3.  Vascular Examination: Capillary refill time immediate b/l.  Vascular status intact b/l with palpable pedal pulses. Pedal hair present b/l. No pain with calf compression b/l. Skin temperature gradient WNL b/l. No cyanosis or clubbing b/l. No ischemia or gangrene noted b/l.   Neurological Examination: Sensation grossly intact b/l with 10 gram monofilament.  Dermatological Examination: Pedal skin with normal turgor, texture and tone b/l.  No open wounds. No interdigital macerations.   Toenails 1-5 b/l thick, discolored, elongated with subungual debris and pain on dorsal palpation.   Healing noted distal tip of right 3rd digit.  Musculoskeletal Examination: Muscle strength 5/5 to all lower extremity muscle groups bilaterally. No pain, crepitus or joint limitation noted with ROM bilateral LE. Hammertoe(s) lesser digits of both feet.  Radiographs: None  Last A1c:      Latest Ref Rng & Units 08/25/2023    8:47 AM  Hemoglobin A1C  Hemoglobin-A1c 4.8 - 5.6 % 6.3     Assessment/Plan: 1. Pain due to onychomycosis of toenails of both feet     Patient was evaluated and treated. All patient's and/or POA's questions/concerns addressed on today's visit. Toenails 1-5 debrided in length and girth without incident. Continue soft, supportive shoe gear daily. Report any pedal injuries to medical professional. Call office if there are any questions/concerns. -Patient/POA to call should there be question/concern in the interim.   Return in about 3 months (around 03/02/2024).  Luella Sager, DPM  [place] LOCATION: [address], [place] [address]                   Office [phone]   [facility] LOCATION: [address] [place], [place] [address] Office [phone]

## 2023-12-08 ENCOUNTER — Other Ambulatory Visit: Payer: Self-pay | Admitting: Physician Assistant

## 2023-12-08 DIAGNOSIS — J301 Allergic rhinitis due to pollen: Secondary | ICD-10-CM

## 2023-12-14 ENCOUNTER — Other Ambulatory Visit: Payer: Self-pay | Admitting: Physician Assistant

## 2023-12-14 DIAGNOSIS — F331 Major depressive disorder, recurrent, moderate: Secondary | ICD-10-CM

## 2024-02-07 ENCOUNTER — Ambulatory Visit (INDEPENDENT_AMBULATORY_CARE_PROVIDER_SITE_OTHER): Admitting: Podiatry

## 2024-02-07 ENCOUNTER — Encounter: Payer: Self-pay | Admitting: Podiatry

## 2024-02-07 DIAGNOSIS — B351 Tinea unguium: Secondary | ICD-10-CM

## 2024-02-07 DIAGNOSIS — M79674 Pain in right toe(s): Secondary | ICD-10-CM | POA: Diagnosis not present

## 2024-02-07 DIAGNOSIS — M79675 Pain in left toe(s): Secondary | ICD-10-CM | POA: Diagnosis not present

## 2024-02-07 NOTE — Progress Notes (Signed)
 Subjective:  Patient ID: Vanessa Evans, female    DOB: [birth year],  MRN: [id number]  Vanessa Evans presents to clinic today for painful thick toenails that are difficult to trim. Pain interferes with ambulation. Aggravating factors include wearing enclosed shoe gear. Pain is relieved with periodic professional debridement.  Chief Complaint  Patient presents with   RFC    Rm22 RFC/ not diabetic/ Dr. Kristina last visit March 20/2025   New problem(s): None.   PCP is McDonough, Tinnie POUR, PA-C.  Allergies  Allergen Reactions   Codeine Shortness Of Breath and Itching    Bronchospasms and asthma attach   Levofloxacin     Altered mental status: drunk feeling, confused, speech problems Other reaction(s): Unknown    Oxycodone  Itching and Shortness Of Breath    Would take benadryl  to eliminate itching. Has had bronchospasm   Ultram [Tramadol] Itching and Other (See Comments)    Bronchospasm and asthma attack   Nitrofurantoin Diarrhea    Tried again and had no problems with it Severe and long lasting   Nsaids Other (See Comments)    Bloody stools, abdominal pain, ulcer History of gastritis   Sulfacetamide Rash    Fever, abdominal pain   Adhesive [Tape] Other (See Comments)    Thin skin. Causes tears. Paper tape okay   Azithromycin      Unsure. Patient does not remember but thinks that it did not work for her.   Ciprofloxacin Other (See Comments)    Severe pain in neck and down back    Ketorolac Itching   Sulfa Antibiotics Itching, Rash and Other (See Comments)    fever   Tolmetin     Other reaction(s): Other (See Comments) Bloody stools, abdominal pain History of chronic gastritis   Zofran  [Ondansetron  Hcl] Other (See Comments)    constipation    Review of Systems: Negative except as noted in the HPI.  Objective: No changes noted in today's physical examination. There were no vitals filed for this visit. Vanessa Evans is a pleasant 78 y.o. female WD, WN in  NAD. AAO x 3.  Vascular Examination: Capillary refill time immediate b/l. Palpable pedal pulses. Pedal hair present b/l. No pain with calf compression b/l. Skin temperature gradient WNL b/l. No cyanosis or clubbing b/l. No ischemia or gangrene noted b/l. No edema noted b/l LE.  Neurological Examination: Sensation grossly intact b/l with 10 gram monofilament.   Dermatological Examination: Pedal skin with normal turgor, texture and tone b/l.  No open wounds. No interdigital macerations.   Toenails 1-5 b/l thick, discolored, elongated with subungual debris and pain on dorsal palpation.   No hyperkeratotic nor porokeratotic lesions present on today's visit.  Musculoskeletal Examination: Muscle strength 5/5 to all lower extremity muscle groups bilaterally. No pain, crepitus or joint limitation noted with ROM bilateral LE. Hammertoe(s) 2-5 b/l.  Radiographs: None  Last A1c:      Latest Ref Rng & Units 08/25/2023    8:47 AM  Hemoglobin A1C  Hemoglobin-A1c 4.8 - 5.6 % 6.3    Assessment/Plan: 1. Pain due to onychomycosis of toenails of both feet     Consent given for treatment. Patient examined. All patient's and/or POA's questions/concerns addressed on today's visit. Mycotic toenails 1-5 debrided in length and girth without incident. Continue soft, supportive shoe gear daily. Report any pedal injuries to medical professional. Call office if there are any quesitons/concerns. -Patient/POA to call should there be question/concern in the interim.   Return in about 3 months (around  05/09/2024).  Delon LITTIE Merlin, DPM      [place] LOCATION: [address] [place] [address]                   Office [phone]   [facility] LOCATION: [address] [place], [place] [address] Office [phone]

## 2024-02-09 ENCOUNTER — Other Ambulatory Visit: Payer: Self-pay | Admitting: Physician Assistant

## 2024-02-09 DIAGNOSIS — E782 Mixed hyperlipidemia: Secondary | ICD-10-CM

## 2024-02-17 ENCOUNTER — Other Ambulatory Visit: Payer: Self-pay

## 2024-02-17 DIAGNOSIS — D509 Iron deficiency anemia, unspecified: Secondary | ICD-10-CM

## 2024-02-18 ENCOUNTER — Inpatient Hospital Stay: Payer: Medicare Other | Attending: Oncology

## 2024-02-18 ENCOUNTER — Encounter: Payer: Self-pay | Admitting: Nurse Practitioner

## 2024-02-18 ENCOUNTER — Inpatient Hospital Stay (HOSPITAL_BASED_OUTPATIENT_CLINIC_OR_DEPARTMENT_OTHER): Payer: Medicare Other | Admitting: Nurse Practitioner

## 2024-02-18 ENCOUNTER — Other Ambulatory Visit: Payer: Medicare Other

## 2024-02-18 ENCOUNTER — Ambulatory Visit: Payer: Medicare Other | Admitting: Oncology

## 2024-02-18 VITALS — BP 122/57 | HR 79 | Temp 98.6°F | Resp 18 | Wt 147.6 lb

## 2024-02-18 DIAGNOSIS — D509 Iron deficiency anemia, unspecified: Secondary | ICD-10-CM

## 2024-02-18 LAB — FERRITIN: Ferritin: 209 ng/mL (ref 11–307)

## 2024-02-18 LAB — CBC WITH DIFFERENTIAL (CANCER CENTER ONLY)
Abs Immature Granulocytes: 0.06 K/uL (ref 0.00–0.07)
Basophils Absolute: 0.1 K/uL (ref 0.0–0.1)
Basophils Relative: 1 %
Eosinophils Absolute: 0.2 K/uL (ref 0.0–0.5)
Eosinophils Relative: 4 %
HCT: 37.1 % (ref 36.0–46.0)
Hemoglobin: 11.9 g/dL — ABNORMAL LOW (ref 12.0–15.0)
Immature Granulocytes: 1 %
Lymphocytes Relative: 32 %
Lymphs Abs: 1.8 K/uL (ref 0.7–4.0)
MCH: 29.2 pg (ref 26.0–34.0)
MCHC: 32.1 g/dL (ref 30.0–36.0)
MCV: 91.2 fL (ref 80.0–100.0)
Monocytes Absolute: 0.8 K/uL (ref 0.1–1.0)
Monocytes Relative: 14 %
Neutro Abs: 2.8 K/uL (ref 1.7–7.7)
Neutrophils Relative %: 48 %
Platelet Count: 308 K/uL (ref 150–400)
RBC: 4.07 MIL/uL (ref 3.87–5.11)
RDW: 12.7 % (ref 11.5–15.5)
WBC Count: 5.7 K/uL (ref 4.0–10.5)
nRBC: 0 % (ref 0.0–0.2)

## 2024-02-18 LAB — IRON AND TIBC
Iron: 88 ug/dL (ref 28–170)
Saturation Ratios: 19 % (ref 10.4–31.8)
TIBC: 465 ug/dL — ABNORMAL HIGH (ref 250–450)
UIBC: 377 ug/dL

## 2024-02-18 NOTE — Progress Notes (Signed)
 Hematology/Oncology Consult Note [facility]  [phone] [phone]  Patient Care Team: Kristina Tinnie MARLA DEVONNA as PCP - General (Physician Assistant) Melanee Annah BROCKS, MD as Consulting Physician (Oncology)   Name of the patient: Vanessa Evans  [id number]  03-07-1946   Date of visit: 02/18/24  Diagnosis - iron deficiency anemia  Chief complaint/ Reason for visit- routine follow-up of iron deficiency anemia  Heme/Onc history:  Patient is a 78 year old female who has seen me back in 2020 for symptoms of iron deficiency and has received IV iron in the past.  Her recent CBC from August 2024 showed H&H of 11.2/35.2.  Ferritin levels were normal at 56 but iron saturation was low at 12% with elevated TIBC of 497.  Patient had an upper endoscopy and colonoscopy in 2022 and she was not deemed to require any further colonoscopies due to her age.  Presently patient reports feeling fatigued.  Denies any blood loss in her stool or urine.  Denies any dark melanotic stools.   Patient received Monoferric  x 1 dose in September 2024  Interval history- Vanessa Evans is a 78 y.o. female who presents to clinic for routine follow up. She last received monoferric  04/2023. Her fatigue is stable and unchanged. Denies any neurologic complaints. Denies recent fevers or illnesses. Denies any easy bleeding or bruising. No melena or hematochezia. No pica or restless leg. Reports good appetite and denies weight loss. Denies chest pain. Denies any nausea, vomiting, constipation, or diarrhea. Denies urinary complaints. Patient offers no further specific complaints today.  ECOG PS- 1 Pain scale- 0  Review of systems- Review of Systems  Constitutional:  Positive for malaise/fatigue. Negative for chills, fever and weight loss.  HENT:  Negative for congestion, ear discharge and nosebleeds.   Eyes:  Negative for blurred vision.  Respiratory:  Negative for cough, hemoptysis,  sputum production, shortness of breath and wheezing.   Cardiovascular:  Negative for chest pain, palpitations, orthopnea and claudication.  Gastrointestinal:  Negative for abdominal pain, blood in stool, constipation, diarrhea, heartburn, melena, nausea and vomiting.  Genitourinary:  Negative for dysuria, flank pain, frequency, hematuria and urgency.  Musculoskeletal:  Negative for back pain, joint pain and myalgias.  Skin:  Negative for rash.  Neurological:  Negative for dizziness, tingling, focal weakness, seizures, weakness and headaches.  Endo/Heme/Allergies:  Does not bruise/bleed easily.  Psychiatric/Behavioral:  Negative for depression and suicidal ideas. The patient does not have insomnia.     Allergies  Allergen Reactions   Codeine Shortness Of Breath and Itching    Bronchospasms and asthma attach   Levofloxacin     Altered mental status: drunk feeling, confused, speech problems Other reaction(s): Unknown    Oxycodone  Itching and Shortness Of Breath    Would take benadryl  to eliminate itching. Has had bronchospasm   Ultram [Tramadol] Itching and Other (See Comments)    Bronchospasm and asthma attack   Nitrofurantoin Diarrhea    Tried again and had no problems with it Severe and long lasting   Nsaids Other (See Comments)    Bloody stools, abdominal pain, ulcer History of gastritis   Sulfacetamide Rash    Fever, abdominal pain   Adhesive [Tape] Other (See Comments)    Thin skin. Causes tears. Paper tape okay   Azithromycin      Unsure. Patient does not remember but thinks that it did not work for her.   Ciprofloxacin Other (See Comments)    Severe pain in neck and down back  Ketorolac Itching   Sulfa Antibiotics Itching, Rash and Other (See Comments)    fever   Tolmetin     Other reaction(s): Other (See Comments) Bloody stools, abdominal pain History of chronic gastritis   Zofran  [Ondansetron  Hcl] Other (See Comments)    constipation   Past Medical History:   Diagnosis Date   Arthritis    osteo   Asthma    needs rescue inhaler few times a year   Claustrophobia 04/21/2020   Depression    Dizziness    light headed spells but it has been a while   Dysrhythmia    tachycardia.(cardizem ). brady during procedures   Fibromyalgia    GERD (gastroesophageal reflux disease)    gastritis   Headache(784.0)    Heart murmur 2019   aortic. dr. hester not worried about this   Hyperlipidemia    Hypertension    Hypothyroidism    Macular degeneration    Migraine    Orthopnea    Pneumonia    long time ago (greater than 5 years ago)   PONV (postoperative nausea and vomiting)    very anxious during cataract surgery. 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 cope.  all benign.  inflammatory process going on in bladder   BREAST BIOPSY Right    benign   BREAST SURGERY Right    lumpectomy   CATARACT EXTRACTION W/PHACO Left 12/25/2014   Procedure: CATARACT EXTRACTION PHACO AND INTRAOCULAR LENS PLACEMENT (IOC);  Surgeon: Elsie Carmine, MD;  Location: [facility] ORS;  Service: Ophthalmology;  Laterality: Left;  US  00:32 AP% 20.0 CDE 6.49   COLONOSCOPY W/ BIOPSIES     COLONOSCOPY W/ POLYPECTOMY  2002, 2004, 2005   adenomatous polyps removed   COLONOSCOPY WITH PROPOFOL  N/A 08/23/2017   Procedure: COLONOSCOPY WITH PROPOFOL ;  Surgeon: Viktoria Lamar DASEN, MD;  Location: [facility] ENDOSCOPY;  Service: Endoscopy;  Laterality: N/A;   COLONOSCOPY WITH PROPOFOL  N/A 11/08/2017   Procedure: COLONOSCOPY WITH PROPOFOL ;  Surgeon: Viktoria Lamar DASEN, MD;  Location: [facility] ENDOSCOPY;  Service: Endoscopy;  Laterality: N/A;   COLONOSCOPY WITH PROPOFOL  N/A 06/30/2021   Procedure: COLONOSCOPY WITH PROPOFOL ;  Surgeon: Maryruth Ole DASEN, MD;  Location: [facility] ENDOSCOPY;  Service: Endoscopy;  Laterality: N/A;   CYSTOCELE REPAIR N/A 04/09/2016   Procedure: ANTERIOR REPAIR (CYSTOCELE);  Surgeon: Lamar SHAUNNA Lesches, MD;  Location: [facility] ORS;  Service: Gynecology;  Laterality: N/A;   DIAGNOSTIC LAPAROSCOPY  2008   removed both ovaries with cysts, tubes and fibroids   DILATION AND CURETTAGE OF UTERUS  1973   ESOPHAGOGASTRODUODENOSCOPY (EGD) WITH PROPOFOL  N/A 08/23/2017   Procedure: ESOPHAGOGASTRODUODENOSCOPY (EGD) WITH PROPOFOL ;  Surgeon: Viktoria Lamar DASEN, MD;  Location: [facility] ENDOSCOPY;  Service: Endoscopy;  Laterality: N/A;   ESOPHAGOGASTRODUODENOSCOPY (EGD) WITH PROPOFOL  N/A 06/30/2021   Procedure: ESOPHAGOGASTRODUODENOSCOPY (EGD) WITH PROPOFOL ;  Surgeon: Maryruth Ole DASEN, MD;  Location: [facility] ENDOSCOPY;  Service: Endoscopy;  Laterality: N/A;   EYE SURGERY Bilateral 2016   HERNIA REPAIR Right 2008   Inguinal hernia Repair, ventral hernia repair   IR KYPHO THORACIC WITH BONE BIOPSY  05/12/2022   IR RADIOLOGIST EVAL & MGMT  05/19/2022   IR RADIOLOGIST EVAL &  MGMT  05/26/2022   JOINT REPLACEMENT Right 2008   knee   KNEE ARTHROSCOPY Right    LUMBAR LAMINECTOMY/DECOMPRESSION MICRODISCECTOMY Right 03/21/2013   Procedure: Right Lumbar five-Sacral one Laminectomy for Synovial Cyst;  Surgeon: Darina MALVA Boehringer, MD;  Location: [facility] NEURO ORS;  Service: Neurosurgery;  Laterality: Right;  right   OOPHORECTOMY     REVERSE SHOULDER ARTHROPLASTY Right 02/22/2018   Procedure: REVERSE SHOULDER ARTHROPLASTY;  Surgeon: Edie Norleen PARAS, MD;  Location: [facility] ORS;  Service: Orthopedics;  Laterality: Right;   TUBAL LIGATION     UNILATERAL SALPINGECTOMY Left 04/09/2016   Procedure: UNILATERAL SALPINGECTOMY;  Surgeon: Lamar SHAUNNA Lesches, MD;  Location: [facility] ORS;  Service: Gynecology;  Laterality: Left;   UPPER GI ENDOSCOPY  2010   with biopsy of gastric erosion   VAGINAL HYSTERECTOMY N/A 04/09/2016   Procedure: HYSTERECTOMY VAGINAL;  Surgeon: Lamar SHAUNNA Lesches, MD;  Location: [facility] ORS;  Service: Gynecology;  Laterality: N/A;   VAGINAL HYSTERECTOMY  2017   and bladder tack   Social History   Socioeconomic History   Marital status: Married    Spouse name: Not on file   Number of children: Not on file   Years of education: Not on file   Highest education level: Not on file  Occupational History   Not on file  Tobacco Use   Smoking status: Former    Current packs/day: 0.00    Types: Cigarettes    Quit date: 26    Years since quitting: 58.5   Smokeless tobacco: Never  Vaping Use   Vaping status: Never Used  Substance and Sexual Activity   Alcohol  use: Yes    Alcohol /week: 1.0 standard drink of alcohol     Types: 1 Glasses of wine per week    Comment: occasionally   Drug use: No   Sexual activity: Not Currently  Other Topics Concern   Not on file  Social History Narrative   Not on file   Social Drivers of Health   Financial Resource Strain: Not on file  Food Insecurity: No Food Insecurity (10/01/2023)   Hunger Vital Sign    Worried About Running Out of Food in the Last Year: Never true    Ran Out of Food in the Last Year: Never true  Transportation Needs: No Transportation Needs (10/01/2023)   PRAPARE - Administrator, Civil Service (Medical): No    Lack of Transportation (Non-Medical): No  Physical Activity: Not on file  Stress: Not on file  Social Connections: Unknown (09/12/2023)   Social Connection and Isolation Panel    Frequency of Communication with Friends and Family: More than three times a week    Frequency of Social Gatherings with Friends and Family: More than three times a week    Attends Religious Services: More than 4 times per year    Active Member of [employer] or Organizations: Patient unable to answer    Attends Banker Meetings: Patient unable to answer    Marital Status: Married  Catering manager Violence: Not At Risk (10/01/2023)   Humiliation, Afraid, Rape, and Kick  questionnaire    Fear of Current or Ex-Partner: No    Emotionally Abused: No    Physically Abused: No    Sexually Abused: No   Family History  Problem Relation Age of Onset   Pulmonary fibrosis Mother    Melanoma Father    COPD Sister    Cardiomyopathy Sister        and  arrhythmia   Atrial fibrillation Sister    COPD Brother    Pulmonary fibrosis Brother    Cancer Brother    Breast cancer Cousin        paternal 1st   Current Outpatient Medications:    acetaminophen  (TYLENOL ) 500 MG tablet, Take 1,000 mg by mouth 2 (two) times daily as needed for moderate pain., Disp: , Rfl:    acidophilus (RISAQUAD) CAPS capsule, Take 1 capsule by mouth daily., Disp: , Rfl:    albuterol  (VENTOLIN  HFA) 108 (90 Base) MCG/ACT inhaler, Inhale 2 puffs into the lungs every 6 (six) hours as needed for wheezing or shortness of breath., Disp: 8 g, Rfl: 3   apixaban  (ELIQUIS ) 5 MG TABS tablet, Take 1 tablet by mouth 2 (two) times daily., Disp: , Rfl:    aspirin  EC 81 MG tablet, Take 81 mg by mouth at bedtime. , Disp: , Rfl:    benzonatate  (TESSALON ) 200 MG capsule, Take 1 capsule (200 mg total) by mouth 3 (three) times daily as needed for cough., Disp: 30 capsule, Rfl: 0   bisacodyl  (DULCOLAX) 5 MG EC tablet, Take 1 tablet (5 mg total) by mouth at bedtime as needed for moderate constipation., Disp: 30 tablet, Rfl: 0   BLACK ELDERBERRY PO, Take 1 Dose by mouth 2 (two) times a day., Disp: , Rfl:    Calcium  Carbonate-Vitamin D  600-400 MG-UNIT tablet, Take 1 tablet by mouth daily., Disp: , Rfl:    cholecalciferol  (VITAMIN D ) 1000 UNITS tablet, Take 1,000 Units by mouth at bedtime. , Disp: , Rfl:    citalopram  (CELEXA ) 20 MG tablet, Take 1 tablet (20 mg total) by mouth daily., Disp: 90 tablet, Rfl: 1   cyanocobalamin  500 MCG tablet, Take 500 mcg by mouth daily., Disp: , Rfl:    diclofenac  Sodium (VOLTAREN ) 1 % GEL, Apply 4 g topically 4 (four) times daily. (Patient taking differently: Apply 4 g topically 4 (four)  times daily as needed.), Disp: 500 g, Rfl: 3   diltiazem  (CARDIZEM  CD) 180 MG 24 hr capsule, Take 1 capsule (180 mg total) by mouth daily., Disp: 30 capsule, Rfl: 11   diphenhydrAMINE  (BENADRYL ) 25 MG tablet, Take 25-50 mg by mouth daily as needed for itching., Disp: , Rfl:    EPINEPHrine  (EPIPEN  2-PAK) 0.3 mg/0.3 mL IJ SOAJ injection, use as directed for severe allergy reaction, Disp: 2 each, Rfl: 1   famotidine  (PEPCID ) 20 MG tablet, Take 20 mg by mouth 2 (two) times daily., Disp: , Rfl:    fenofibrate  160 MG tablet, Take 1 tablet (160 mg total) by mouth daily. for cholesterol, Disp: 90 tablet, Rfl: 0   fluticasone  (FLONASE ) 50 MCG/ACT nasal spray, Use 2 sprays in each nostril daily,, Disp: 16 g, Rfl: 2   fluticasone  furoate-vilanterol (BREO ELLIPTA ) 100-25 MCG/ACT AEPB, Inhale 1 puff into the lungs daily., Disp: 60 each, Rfl: 1   guaiFENesin  (MUCINEX ) 600 MG 12 hr tablet, Take 600 mg by mouth at bedtime as needed (congestion)., Disp: , Rfl:    Hypromellose (ARTIFICIAL TEARS OP), Place 1-2 drops into both eyes daily as needed (dry eyes)., Disp: , Rfl:    ipratropium-albuterol  (DUONEB) 0.5-2.5 (3) MG/3ML SOLN, Take 3 mLs by nebulization every 6 (six) hours as needed., Disp: 360 mL, Rfl: 0   ketotifen  (ZADITOR ) 0.025 % ophthalmic solution, Place 1 drop into both eyes 2 (two) times daily as needed (allergies)., Disp: , Rfl:    levocetirizine (XYZAL ) 5 MG tablet, Take 1 tablet (5 mg total) by  mouth every evening., Disp: 90 tablet, Rfl: 0   levothyroxine  (SYNTHROID , LEVOTHROID) 50 MCG tablet, Take 50 mcg by mouth daily before breakfast. Brand Name only, Disp: , Rfl:    lidocaine  (XYLOCAINE ) 5 % ointment, Apply 1 Application topically as needed., Disp: 35.44 g, Rfl: 0   losartan  (COZAAR ) 25 MG tablet, Take 25 mg by mouth daily., Disp: , Rfl:    Magnesium  250 MG TABS, Take 1 tablet by mouth daily., Disp: , Rfl:    Melatonin 5 MG TABS, Take 1 tablet by mouth at bedtime. For sleep, Disp: , Rfl:     methocarbamol  (ROBAXIN ) 500 MG tablet, TAKE ONE-HALF TO 1 TABLET TWICE DAILY AS NEEDED., Disp: , Rfl:    metoprolol  succinate (TOPROL -XL) 25 MG 24 hr tablet, Take 1 tablet (25 mg total) by mouth 2 (two) times daily., Disp: 60 tablet, Rfl: 11   Multiple Vitamins-Minerals (MULTIVITAMIN WITH MINERALS) tablet, Take 1 tablet by mouth daily., Disp: , Rfl:    nystatin  (MYCOSTATIN ) 100000 UNIT/ML suspension, Take 5 mLs (500,000 Units total) by mouth 4 (four) times daily., Disp: 60 mL, Rfl: 0   nystatin  (MYCOSTATIN /NYSTOP ) powder, Apply 1 Application topically 3 (three) times daily., Disp: 15 g, Rfl: 0   OXYGEN , Inhale into the lungs. 2 litre at night, Disp: , Rfl:    pantoprazole  (PROTONIX ) 40 MG tablet, Take 1 tablet (40 mg total) by mouth daily., Disp: 90 tablet, Rfl: 1   polyethylene glycol powder (GLYCOLAX /MIRALAX ) 17 GM/SCOOP powder, Take 17 g by mouth daily., Disp: 238 g, Rfl: 0   potassium chloride  SA (KLOR-CON  M) 20 MEQ tablet, Take 20 mEq by mouth daily as needed., Disp: , Rfl:    predniSONE  (STERAPRED UNI-PAK 48 TAB) 10 MG (48) TBPK tablet, Take as directed, Disp: 48 each, Rfl: 0   raloxifene  (EVISTA ) 60 MG tablet, Take 60 mg by mouth daily., Disp: , Rfl:    rosuvastatin  (CRESTOR ) 5 MG tablet, Take 1 tablet (5 mg total) by mouth daily., Disp: 90 tablet, Rfl: 0   sodium chloride  (OCEAN) 0.65 % SOLN nasal spray, Place 1 spray into both nostrils as needed for congestion., Disp: , Rfl:    spironolactone (ALDACTONE) 25 MG tablet, Take 25 mg by mouth once., Disp: , Rfl:    torsemide (DEMADEX) 20 MG tablet, Take 20 mg by mouth daily as needed., Disp: , Rfl:    Vitamin A 7.5 MG (25000 UT) CAPS, Take 1 tablet by mouth 2 (two) times daily., Disp: , Rfl:   Physical exam:  Vitals:   02/18/24 1510  BP: (!) 122/57  Pulse: 79  Resp: 18  Temp: 98.6 F (37 C)  SpO2: 97%  Weight: 147 lb 9.6 oz (67 kg)   Physical Exam Vitals reviewed.  Constitutional:      Appearance: She is not ill-appearing.   Cardiovascular:     Rate and Rhythm: Normal rate and regular rhythm.  Pulmonary:     Effort: Pulmonary effort is normal.  Abdominal:     General: There is no distension.  Skin:    General: Skin is warm and dry.     Coloration: Skin is not pale.  Neurological:     Mental Status: She is alert and oriented to person, place, and time.  Psychiatric:        Mood and Affect: Mood normal.        Behavior: Behavior normal.       Latest Ref Rng & Units 02/18/2024    3:02 PM  CBC  WBC 4.0 - 10.5 K/uL 5.7   Hemoglobin 12.0 - 15.0 g/dL 88.0   Hematocrit 63.9 - 46.0 % 37.1   Platelets 150 - 400 K/uL 308    Iron/TIBC/Ferritin/ %Sat    Component Value Date/Time   IRON 88 02/18/2024 1502   IRON 61 04/08/2023 1522   TIBC 465 (H) 02/18/2024 1502   TIBC 497 (H) 04/08/2023 1522   FERRITIN 209 02/18/2024 1502   FERRITIN 56 04/08/2023 1522   IRONPCTSAT 19 02/18/2024 1502   IRONPCTSAT 12 (L) 04/08/2023 1522   Assessment and plan- Patient is a 78 y.o. female who returns to clinic for follow up of:   Iron deficiency anemia- Hmg 11.9, ferritin 209, iron sat 19%. Stable. Counts have been stable. Hold IV iron. Iron stores have remained stable but hemlogobin has dipped to 11.9 (previously 12.4). Plan to check labs in 4 months and then she can follow up with Dr. Melanee in 8 months.   Disposition:  4 mo - labs (cbc, ferritin, iron studies) 8 mo- labs (cbc, ferritin, iron studies), Dr Melanee, +/- monoferric - la  Visit Diagnosis 1. Iron deficiency anemia, unspecified iron deficiency anemia type     Tinnie Dawn, DNP, AGNP-C, AOCNP [facility] [phone] (clinic) 02/18/2024

## 2024-02-24 ENCOUNTER — Ambulatory Visit: Admitting: Physician Assistant

## 2024-02-24 ENCOUNTER — Encounter: Payer: Self-pay | Admitting: Physician Assistant

## 2024-02-24 VITALS — BP 130/78 | HR 99 | Temp 98.4°F | Resp 16 | Ht 61.0 in | Wt 146.8 lb

## 2024-02-24 DIAGNOSIS — I10 Essential (primary) hypertension: Secondary | ICD-10-CM

## 2024-02-24 DIAGNOSIS — R7303 Prediabetes: Secondary | ICD-10-CM | POA: Diagnosis not present

## 2024-02-24 DIAGNOSIS — R32 Unspecified urinary incontinence: Secondary | ICD-10-CM

## 2024-02-24 DIAGNOSIS — Z9189 Other specified personal risk factors, not elsewhere classified: Secondary | ICD-10-CM

## 2024-02-24 DIAGNOSIS — N8110 Cystocele, unspecified: Secondary | ICD-10-CM

## 2024-02-24 DIAGNOSIS — K219 Gastro-esophageal reflux disease without esophagitis: Secondary | ICD-10-CM

## 2024-02-24 LAB — POCT GLYCOSYLATED HEMOGLOBIN (HGB A1C): Hemoglobin A1C: 5.8 % — AB (ref 4.0–5.6)

## 2024-02-24 MED ORDER — ROSUVASTATIN CALCIUM 5 MG PO TABS
5.0000 mg | ORAL_TABLET | Freq: Every day | ORAL | 1 refills | Status: AC
Start: 2024-02-24 — End: ?

## 2024-02-24 MED ORDER — PANTOPRAZOLE SODIUM 40 MG PO TBEC: 40.0000 mg | DELAYED_RELEASE_TABLET | Freq: Two times a day (BID) | ORAL | 1 refills | Status: DC

## 2024-02-24 NOTE — Progress Notes (Signed)
 [facility] [address] [place], [place] [address]  Internal MEDICINE  Office Visit Note  Patient Name: Vanessa Evans  [id number]  [id number]  Date of Service: 03/01/2024  Chief Complaint  Patient presents with   Follow-up   Depression   Anxiety   Prediabetes    HPI Pt is here for routine follow up -thinks bladder/vaginal prolapse, feels heaviness, incontinence and having to get up several times at night as well. Will refer to Urogyn for evaluation. She has had prior bladder tack in the past -some mucus coughing up -needs breo samples, but none available. Will give trelegy sample to try -seeing cardiology Tues -Bp a little higher recently, sometimes 120-130 systolic, occasional 150s -seeing GI, but needs pantoprazole  refills now and will send for her   Current Medication: Outpatient Encounter Medications as of 02/24/2024  Medication Sig Note   acetaminophen  (TYLENOL ) 500 MG tablet Take 1,000 mg by mouth 2 (two) times daily as needed for moderate pain.    acidophilus (RISAQUAD) CAPS capsule Take 1 capsule by mouth daily.    albuterol  (VENTOLIN  HFA) 108 (90 Base) MCG/ACT inhaler Inhale 2 puffs into the lungs every 6 (six) hours as needed for wheezing or shortness of breath.    apixaban  (ELIQUIS ) 5 MG TABS tablet Take 1 tablet by mouth 2 (two) times daily.    aspirin  EC 81 MG tablet Take 81 mg by mouth at bedtime.     benzonatate  (TESSALON ) 200 MG capsule Take 1 capsule (200 mg total) by mouth 3 (three) times daily as needed for cough.    bisacodyl  (DULCOLAX) 5 MG EC tablet Take 1 tablet (5 mg total) by mouth at bedtime as needed for moderate constipation.    BLACK ELDERBERRY PO Take 1 Dose by mouth 2 (two) times a day.    Calcium  Carbonate-Vitamin D  600-400 MG-UNIT tablet Take 1 tablet by mouth daily.    cholecalciferol  (VITAMIN D ) 1000 UNITS tablet Take 1,000 Units by mouth at bedtime.     citalopram  (CELEXA ) 20 MG tablet Take 1 tablet (20 mg total) by mouth  daily.    cyanocobalamin  500 MCG tablet Take 500 mcg by mouth daily.    diclofenac  Sodium (VOLTAREN ) 1 % GEL Apply 4 g topically 4 (four) times daily. (Patient taking differently: Apply 4 g topically 4 (four) times daily as needed.)    diltiazem  (CARDIZEM  CD) 180 MG 24 hr capsule Take 1 capsule (180 mg total) by mouth daily. 10/01/2023: Patient states taking 120 mg 1 x day   diphenhydrAMINE  (BENADRYL ) 25 MG tablet Take 25-50 mg by mouth daily as needed for itching.    EPINEPHrine  (EPIPEN  2-PAK) 0.3 mg/0.3 mL IJ SOAJ injection use as directed for severe allergy reaction    famotidine  (PEPCID ) 20 MG tablet Take 20 mg by mouth 2 (two) times daily.    fenofibrate  160 MG tablet Take 1 tablet (160 mg total) by mouth daily. for cholesterol    fluticasone  (FLONASE ) 50 MCG/ACT nasal spray Use 2 sprays in each nostril daily,    fluticasone  furoate-vilanterol (BREO ELLIPTA ) 100-25 MCG/ACT AEPB Inhale 1 puff into the lungs daily.    guaiFENesin  (MUCINEX ) 600 MG 12 hr tablet Take 600 mg by mouth at bedtime as needed (congestion).    Hypromellose (ARTIFICIAL TEARS OP) Place 1-2 drops into both eyes daily as needed (dry eyes).    ipratropium-albuterol  (DUONEB) 0.5-2.5 (3) MG/3ML SOLN Take 3 mLs by nebulization every 6 (six) hours as needed.    ketotifen  (ZADITOR ) 0.025 %  ophthalmic solution Place 1 drop into both eyes 2 (two) times daily as needed (allergies).    levocetirizine (XYZAL ) 5 MG tablet Take 1 tablet (5 mg total) by mouth every evening.    levothyroxine  (SYNTHROID , LEVOTHROID) 50 MCG tablet Take 50 mcg by mouth daily before breakfast. Brand Name only    lidocaine  (XYLOCAINE ) 5 % ointment Apply 1 Application topically as needed.    losartan  (COZAAR ) 25 MG tablet Take 25 mg by mouth daily.    Magnesium  250 MG TABS Take 1 tablet by mouth daily.    Melatonin 5 MG TABS Take 1 tablet by mouth at bedtime. For sleep    methocarbamol  (ROBAXIN ) 500 MG tablet TAKE ONE-HALF TO 1 TABLET TWICE DAILY AS NEEDED.     metoprolol  succinate (TOPROL -XL) 25 MG 24 hr tablet Take 1 tablet (25 mg total) by mouth 2 (two) times daily. 10/01/2023: Patient states taking 1/2 tablet in the morning 1/2 tablet in the evening.    Multiple Vitamins-Minerals (MULTIVITAMIN WITH MINERALS) tablet Take 1 tablet by mouth daily.    nystatin  (MYCOSTATIN ) 100000 UNIT/ML suspension Take 5 mLs (500,000 Units total) by mouth 4 (four) times daily.    nystatin  (MYCOSTATIN /NYSTOP ) powder Apply 1 Application topically 3 (three) times daily.    OXYGEN  Inhale into the lungs. 2 litre at night    polyethylene glycol powder (GLYCOLAX /MIRALAX ) 17 GM/SCOOP powder Take 17 g by mouth daily.    potassium chloride  SA (KLOR-CON  M) 20 MEQ tablet Take 20 mEq by mouth daily as needed.    predniSONE  (STERAPRED UNI-PAK 48 TAB) 10 MG (48) TBPK tablet Take as directed    raloxifene  (EVISTA ) 60 MG tablet Take 60 mg by mouth daily.    sodium chloride  (OCEAN) 0.65 % SOLN nasal spray Place 1 spray into both nostrils as needed for congestion.    spironolactone (ALDACTONE) 25 MG tablet Take 25 mg by mouth once.    torsemide (DEMADEX) 20 MG tablet Take 20 mg by mouth daily as needed. 10/01/2023: Patient states takes differently. Takes if weight gain of 2 lbs overnight, 3-4 pound in 1-2 days   Vitamin A 7.5 MG (25000 UT) CAPS Take 1 tablet by mouth 2 (two) times daily.    [DISCONTINUED] pantoprazole  (PROTONIX ) 40 MG tablet Take 1 tablet (40 mg total) by mouth daily. 10/01/2023: 1 tablet 2 x per day   [DISCONTINUED] rosuvastatin  (CRESTOR ) 5 MG tablet Take 1 tablet (5 mg total) by mouth daily.    pantoprazole  (PROTONIX ) 40 MG tablet Take 1 tablet (40 mg total) by mouth 2 (two) times daily.    rosuvastatin  (CRESTOR ) 5 MG tablet Take 1 tablet (5 mg total) by mouth daily.    No facility-administered encounter medications on file as of 02/24/2024.    Surgical History: Past Surgical History:  Procedure Laterality Date   BACK SURGERY  2014   removed bone to get to a benign  tumor that was pushing on spinal column   BIOPSY THYROID   2018   bladder biopsies  2003   9 biopsies done by dr. cope.  all benign.  inflammatory process going on in bladder   BREAST BIOPSY Right    benign   BREAST SURGERY Right    lumpectomy   CATARACT EXTRACTION W/PHACO Left 12/25/2014   Procedure: CATARACT EXTRACTION PHACO AND INTRAOCULAR LENS PLACEMENT (IOC);  Surgeon: Elsie Carmine, MD;  Location: [facility] ORS;  Service: Ophthalmology;  Laterality: Left;  US  00:32 AP% 20.0 CDE 6.49   COLONOSCOPY W/ BIOPSIES  COLONOSCOPY W/ POLYPECTOMY  2002, 2004, 2005   adenomatous polyps removed   COLONOSCOPY WITH PROPOFOL  N/A 08/23/2017   Procedure: COLONOSCOPY WITH PROPOFOL ;  Surgeon: Viktoria Lamar DASEN, MD;  Location: [facility] ENDOSCOPY;  Service: Endoscopy;  Laterality: N/A;   COLONOSCOPY WITH PROPOFOL  N/A 11/08/2017   Procedure: COLONOSCOPY WITH PROPOFOL ;  Surgeon: Viktoria Lamar DASEN, MD;  Location: [facility] ENDOSCOPY;  Service: Endoscopy;  Laterality: N/A;   COLONOSCOPY WITH PROPOFOL  N/A 06/30/2021   Procedure: COLONOSCOPY WITH PROPOFOL ;  Surgeon: Maryruth Ole DASEN, MD;  Location: [facility] ENDOSCOPY;  Service: Endoscopy;  Laterality: N/A;   CYSTOCELE REPAIR N/A 04/09/2016   Procedure: ANTERIOR REPAIR (CYSTOCELE);  Surgeon: Lamar SHAUNNA Lesches, MD;  Location: [facility] ORS;  Service: Gynecology;  Laterality: N/A;   DIAGNOSTIC LAPAROSCOPY  2008   removed both ovaries with cysts, tubes and fibroids   DILATION AND CURETTAGE OF UTERUS  1973   ESOPHAGOGASTRODUODENOSCOPY (EGD) WITH PROPOFOL  N/A 08/23/2017   Procedure: ESOPHAGOGASTRODUODENOSCOPY (EGD) WITH PROPOFOL ;  Surgeon: Viktoria Lamar DASEN, MD;  Location: [facility] ENDOSCOPY;  Service: Endoscopy;  Laterality: N/A;   ESOPHAGOGASTRODUODENOSCOPY (EGD) WITH PROPOFOL  N/A 06/30/2021   Procedure: ESOPHAGOGASTRODUODENOSCOPY (EGD) WITH PROPOFOL ;  Surgeon: Maryruth Ole DASEN, MD;  Location: [facility] ENDOSCOPY;  Service: Endoscopy;  Laterality: N/A;   EYE SURGERY Bilateral 2016   HERNIA  REPAIR Right 2008   Inguinal hernia Repair, ventral hernia repair   IR KYPHO THORACIC WITH BONE BIOPSY  05/12/2022   IR RADIOLOGIST EVAL & MGMT  05/19/2022   IR RADIOLOGIST EVAL & MGMT  05/26/2022   JOINT REPLACEMENT Right 2008   knee   KNEE ARTHROSCOPY Right    LUMBAR LAMINECTOMY/DECOMPRESSION MICRODISCECTOMY Right 03/21/2013   Procedure: Right Lumbar five-Sacral one Laminectomy for Synovial Cyst;  Surgeon: Darina MALVA Boehringer, MD;  Location: [facility] NEURO ORS;  Service: Neurosurgery;  Laterality: Right;  right   OOPHORECTOMY     REVERSE SHOULDER ARTHROPLASTY Right 02/22/2018   Procedure: REVERSE SHOULDER ARTHROPLASTY;  Surgeon: Edie Norleen PARAS, MD;  Location: [facility] ORS;  Service: Orthopedics;  Laterality: Right;   TUBAL LIGATION     UNILATERAL SALPINGECTOMY Left 04/09/2016   Procedure: UNILATERAL SALPINGECTOMY;  Surgeon: Lamar SHAUNNA Lesches, MD;  Location: [facility] ORS;  Service: Gynecology;  Laterality: Left;   UPPER GI ENDOSCOPY  2010   with biopsy of gastric erosion   VAGINAL HYSTERECTOMY N/A 04/09/2016   Procedure: HYSTERECTOMY VAGINAL;  Surgeon: Lamar SHAUNNA Lesches, MD;  Location: [facility] ORS;  Service: Gynecology;  Laterality: N/A;   VAGINAL HYSTERECTOMY  2017   and bladder tack    Medical History: Past Medical History:  Diagnosis Date   Arthritis    osteo   Asthma    needs rescue inhaler few times a year   Claustrophobia 04/21/2020   Depression    Dizziness    light headed spells but it has been a while   Dysrhythmia    tachycardia.(cardizem ). brady during procedures   Fibromyalgia    GERD (gastroesophageal reflux disease)    gastritis   Headache(784.0)    Heart murmur 2019   aortic. dr. hester not worried about this   Hyperlipidemia    Hypertension    Hypothyroidism    Macular degeneration    Migraine    Orthopnea    Pneumonia    long time ago (greater than 5 years ago)   PONV (postoperative nausea and vomiting)    very anxious during cataract surgery. brady during colonoscopy    Shortness of breath    any exertion, cannot lie  flat    Family History: Family History  Problem Relation Age of Onset   Pulmonary fibrosis Mother    Melanoma Father    COPD Sister    Cardiomyopathy Sister        and arrhythmia   Atrial fibrillation Sister    COPD Brother    Pulmonary fibrosis Brother    Cancer Brother    Breast cancer Cousin        paternal 1st    Social History   Socioeconomic History   Marital status: Married    Spouse name: Not on file   Number of children: Not on file   Years of education: Not on file   Highest education level: Not on file  Occupational History   Not on file  Tobacco Use   Smoking status: Former    Current packs/day: 0.00    Types: Cigarettes    Quit date: 1967    Years since quitting: 58.5   Smokeless tobacco: Never  Vaping Use   Vaping status: Never Used  Substance and Sexual Activity   Alcohol  use: Yes    Alcohol /week: 1.0 standard drink of alcohol     Types: 1 Glasses of wine per week    Comment: occasionally   Drug use: No   Sexual activity: Not Currently  Other Topics Concern   Not on file  Social History Narrative   Not on file   Social Drivers of Health   Financial Resource Strain: Not on file  Food Insecurity: No Food Insecurity (10/01/2023)   Hunger Vital Sign    Worried About Running Out of Food in the Last Year: Never true    Ran Out of Food in the Last Year: Never true  Transportation Needs: No Transportation Needs (10/01/2023)   PRAPARE - Administrator, Civil Service (Medical): No    Lack of Transportation (Non-Medical): No  Physical Activity: Not on file  Stress: Not on file  Social Connections: Unknown (09/12/2023)   Social Connection and Isolation Panel    Frequency of Communication with Friends and Family: More than three times a week    Frequency of Social Gatherings with Friends and Family: More than three times a week    Attends Religious Services: More than 4 times per year     Active Member of [employer] or Organizations: Patient unable to answer    Attends Banker Meetings: Patient unable to answer    Marital Status: Married  Catering manager Violence: Not At Risk (10/01/2023)   Humiliation, Afraid, Rape, and Kick questionnaire    Fear of Current or Ex-Partner: No    Emotionally Abused: No    Physically Abused: No    Sexually Abused: No      Review of Systems  Constitutional:  Negative for chills and unexpected weight change.  HENT:  Positive for postnasal drip. Negative for rhinorrhea, sneezing and sore throat.   Eyes:  Negative for redness.  Respiratory:  Positive for cough. Negative for chest tightness and shortness of breath.   Cardiovascular:  Negative for chest pain and palpitations.  Gastrointestinal:  Negative for abdominal pain, constipation, diarrhea, nausea and vomiting.  Genitourinary:  Positive for frequency and urgency.       Vaginal pressure/heaviness  Musculoskeletal:  Positive for arthralgias. Negative for joint swelling and neck pain.  Skin:  Negative for wound.  Neurological: Negative.  Negative for tremors and numbness.  Hematological:  Negative for adenopathy. Does not bruise/bleed easily.  Psychiatric/Behavioral:  Positive  for dysphoric mood. Negative for self-injury, sleep disturbance and suicidal ideas. Behavioral problem: Depression.The patient is nervous/anxious.     Vital Signs: BP 130/78 Comment: 146/73  Pulse 99   Temp 98.4 F (36.9 C)   Resp 16   Ht 5' 1 (1.549 m)   Wt 146 lb 12.8 oz (66.6 kg)   SpO2 98%   BMI 27.74 kg/m    Physical Exam Vitals and nursing note reviewed.  Constitutional:      General: She is not in acute distress.    Appearance: Normal appearance. She is well-developed. She is not diaphoretic.  HENT:     Head: Normocephalic and atraumatic.  Eyes:     Pupils: Pupils are equal, round, and reactive to light.  Neck:     Thyroid : No thyromegaly.     Vascular: No JVD.     Trachea: No  tracheal deviation.  Cardiovascular:     Rate and Rhythm: Normal rate and regular rhythm.     Heart sounds: Normal heart sounds. No murmur heard.    No friction rub. No gallop.  Pulmonary:     Effort: Pulmonary effort is normal. No respiratory distress.     Breath sounds: No wheezing.  Skin:    General: Skin is warm and dry.  Neurological:     Mental Status: She is alert and oriented to person, place, and time.  Psychiatric:        Behavior: Behavior normal.        Thought Content: Thought content normal.        Judgment: Judgment normal.        Assessment/Plan: 1. Benign essential HTN (Primary) Stable, continue current medication  2. Prediabetes - POCT HgB A1C is 5.8 which is Improved from 6.3 last check, Continue to work on diet and exercise  3. Female bladder prolapse Possible prolapse of pelvic organ, will refer to urogyn for further evaluation - Ambulatory referral to Urogynecology  4. Urinary incontinence, unspecified type - Ambulatory referral to Urogynecology  5. Gastroesophageal reflux disease without esophagitis - pantoprazole  (PROTONIX ) 40 MG tablet; Take 1 tablet (40 mg total) by mouth 2 (two) times daily.  Dispense: 60 tablet; Refill: 1  6. Candidate for statin therapy due to risk of future cardiovascular event - rosuvastatin  (CRESTOR ) 5 MG tablet; Take 1 tablet (5 mg total) by mouth daily.  Dispense: 90 tablet; Refill: 1   General Counseling: Esabella verbalizes understanding of the findings of todays visit and agrees with plan of treatment. I have discussed any further diagnostic evaluation that may be needed or ordered today. We also reviewed her medications today. she has been encouraged to call the office with any questions or concerns that should arise related to todays visit.    Orders Placed This Encounter  Procedures   Ambulatory referral to Urogynecology   POCT HgB A1C    Meds ordered this encounter  Medications   pantoprazole  (PROTONIX ) 40  MG tablet    Sig: Take 1 tablet (40 mg total) by mouth 2 (two) times daily.    Dispense:  60 tablet    Refill:  1   rosuvastatin  (CRESTOR ) 5 MG tablet    Sig: Take 1 tablet (5 mg total) by mouth daily.    Dispense:  90 tablet    Refill:  1    This patient was seen by Tinnie Pro, PA-C in collaboration with Dr. Sigrid Bathe as a part of collaborative care agreement.   Total time spent:30 Minutes Time spent  includes review of chart, medications, test results, and follow up plan with the patient.      Dr Fozia M Khan Internal medicine

## 2024-03-17 ENCOUNTER — Other Ambulatory Visit: Payer: Self-pay | Admitting: Physician Assistant

## 2024-03-17 DIAGNOSIS — J4520 Mild intermittent asthma, uncomplicated: Secondary | ICD-10-CM

## 2024-04-06 ENCOUNTER — Encounter: Payer: Self-pay | Admitting: Oncology

## 2024-04-12 ENCOUNTER — Telehealth: Payer: Self-pay

## 2024-04-12 ENCOUNTER — Telehealth: Payer: Self-pay | Admitting: Internal Medicine

## 2024-04-12 MED ORDER — AMOXICILLIN-POT CLAVULANATE 875-125 MG PO TABS: 1.0000 | ORAL_TABLET | Freq: Two times a day (BID) | ORAL | 0 refills | Status: DC

## 2024-04-12 NOTE — Telephone Encounter (Signed)
 O2 recert order signed & faxed back to [facility]; [phone]. Scanned.

## 2024-04-12 NOTE — Telephone Encounter (Signed)
 Pt [facility] pt had Covid they gave her antiviral med due to insurance not covered they her prednisone  but she still having sinus infection as per  alyssa send her Augmentin  and also advised her that she can use her inhaler as prescribed and if her oxygen  drops or symptoms worse go to ED and need appt with Dsk

## 2024-04-14 ENCOUNTER — Other Ambulatory Visit: Payer: Self-pay

## 2024-04-14 MED ORDER — FLUCONAZOLE 150 MG PO TABS: ORAL_TABLET | ORAL | 2 refills | Status: DC

## 2024-04-14 NOTE — Telephone Encounter (Signed)
 Pt called that she is having yeast infection due to she is on antibiotic and jardiance and as per lauren sent diflucan 

## 2024-04-21 ENCOUNTER — Ambulatory Visit: Admitting: Podiatry

## 2024-05-01 ENCOUNTER — Ambulatory Visit (INDEPENDENT_AMBULATORY_CARE_PROVIDER_SITE_OTHER): Admitting: Podiatry

## 2024-05-01 ENCOUNTER — Encounter: Payer: Self-pay | Admitting: Podiatry

## 2024-05-01 DIAGNOSIS — M79675 Pain in left toe(s): Secondary | ICD-10-CM | POA: Diagnosis not present

## 2024-05-01 DIAGNOSIS — M79674 Pain in right toe(s): Secondary | ICD-10-CM

## 2024-05-01 DIAGNOSIS — B351 Tinea unguium: Secondary | ICD-10-CM

## 2024-05-01 DIAGNOSIS — L89890 Pressure ulcer of other site, unstageable: Secondary | ICD-10-CM

## 2024-05-01 NOTE — Progress Notes (Signed)
 Subjective:  Patient ID: Vanessa Evans, female    DOB: [birth year],  MRN: [id number]  Vanessa Evans presents to clinic today for painful mycotic toenails of both feet that are difficult to trim. Pain interferes with daily activities and wearing enclosed shoe gear comfortably. Patient has just returned from [place]. She really enjoyed her time there with family. She feels she may have walked too much and her right ankle is bothering her. Chief Complaint  Patient presents with   RFC     RFC Non diabetic toenail trim. LOV with PCP 02/24/24.   New problem(s): She relates she may need another injection in her right foot as she has h/o tendonitis. Also has a spot on her right ankle which may have been rubbed when she was wearing a pair of Crocs. Area is tender. She has kept it covered with a several band-aids.  PCP is McDonough, Tinnie POUR, PA-C. LOV 02/24/2024.  Allergies  Allergen Reactions   Codeine Shortness Of Breath and Itching    Bronchospasms and asthma attach   Levofloxacin     Altered mental status: drunk feeling, confused, speech problems Other reaction(s): Unknown    Oxycodone  Itching and Shortness Of Breath    Would take benadryl  to eliminate itching. Has had bronchospasm   Ultram [Tramadol] Itching and Other (See Comments)    Bronchospasm and asthma attack   Nitrofurantoin Diarrhea    Tried again and had no problems with it Severe and long lasting   Nsaids Other (See Comments)    Bloody stools, abdominal pain, ulcer History of gastritis   Sulfacetamide Rash    Fever, abdominal pain   Adhesive [Tape] Other (See Comments)    Thin skin. Causes tears. Paper tape okay   Azithromycin      Unsure. Patient does not remember but thinks that it did not work for her.   Ciprofloxacin Other (See Comments)    Severe pain in neck and down back    Ketorolac Itching   Sulfa Antibiotics Itching, Rash and Other (See Comments)    fever   Tolmetin     Other  reaction(s): Other (See Comments) Bloody stools, abdominal pain History of chronic gastritis   Zofran  [Ondansetron  Hcl] Other (See Comments)    constipation    Review of Systems: Negative except as noted in the HPI.  Objective:  There were no vitals filed for this visit. Vanessa Evans is a pleasant 78 y.o. female WD, WN in NAD. AAO x 3.  Vascular Examination: Capillary refill time immediate b/l. Vascular status intact b/l with palpable pedal pulses. Pedal hair present b/l. No pain with calf compression b/l. Skin temperature gradient WNL b/l. No cyanosis or clubbing b/l. No ischemia or gangrene noted b/l.   Neurological Examination: Sensation grossly intact b/l with 10 gram monofilament. Vibratory sensation intact b/l.   Dermatological Examination: Small pressure sore noted medial navicular RLE with tenderness to palpation. No fluctuance, no warmth, no drainage. +Tenderness to palpation.   Pedal skin thin and atrophic b/l.  No interdigital macerations.   Toenails 1-5 b/l thick, discolored, elongated with subungual debris and pain on dorsal palpation.   No corns, calluses, nor porokeratotic lesions.  Musculoskeletal Examination: Muscle strength 5/5 to all lower extremity muscle groups bilaterally. Hammertoe(s) 2-5 b/l.SABRA No pain, crepitus or joint limitation noted with ROM b/l LE.  Patient ambulates independently without assistive aids. Pes planovalgus deformity noted right lower extremity. Prominent 5th metatarsal bases b/l.  Radiographs: None  Last A1c:      Latest Ref Rng & Units 02/24/2024    3:55 PM 08/25/2023    8:47 AM  Hemoglobin A1C  Hemoglobin-A1c 4.0 - 5.6 % 5.8  6.3    Assessment/Plan: 1. Pain due to onychomycosis of toenails of both feet   2. Pressure injury of right foot, unstageable (HCC)   Consent given for treatment. Patient examined. All patient's and/or POA's questions/concerns addressed on today's visit. Mycotic toenails 1-5 debrided in length and  girth without incident.   She would like to see someone for her right footand states she will be back in town on Wednesday. Applied Biatain Wound dressing over navicular right LE. She related more comfort with cushioning from dressing. Patient given package in order to purchase more online.  Continue soft, supportive shoe gear daily. Report any pedal injuries to medical professional. Call office if there are any quesitons/concerns. Patient/POA to call should there be question/concern in the interim.   Return in about 9 weeks (around 07/03/2024).  Delon LITTIE Merlin, DPM      [facility] LOCATION: [address] [place] [address]                   Office [phone]   [facility] LOCATION: [address] [place], [place] [address] Office [phone]

## 2024-05-10 ENCOUNTER — Other Ambulatory Visit: Payer: Self-pay | Admitting: Physician Assistant

## 2024-05-10 DIAGNOSIS — E782 Mixed hyperlipidemia: Secondary | ICD-10-CM

## 2024-05-12 ENCOUNTER — Ambulatory Visit: Admitting: Podiatry

## 2024-05-30 ENCOUNTER — Ambulatory Visit: Admitting: Podiatry

## 2024-06-05 ENCOUNTER — Ambulatory Visit (INDEPENDENT_AMBULATORY_CARE_PROVIDER_SITE_OTHER): Admitting: Internal Medicine

## 2024-06-05 ENCOUNTER — Encounter: Payer: Self-pay | Admitting: Internal Medicine

## 2024-06-05 VITALS — BP 130/74 | HR 82 | Temp 98.4°F | Resp 16 | Ht 61.0 in | Wt 140.3 lb

## 2024-06-05 DIAGNOSIS — K219 Gastro-esophageal reflux disease without esophagitis: Secondary | ICD-10-CM

## 2024-06-05 DIAGNOSIS — I2583 Coronary atherosclerosis due to lipid rich plaque: Secondary | ICD-10-CM

## 2024-06-05 DIAGNOSIS — I2510 Atherosclerotic heart disease of native coronary artery without angina pectoris: Secondary | ICD-10-CM | POA: Diagnosis not present

## 2024-06-05 DIAGNOSIS — J4520 Mild intermittent asthma, uncomplicated: Secondary | ICD-10-CM

## 2024-06-05 NOTE — Progress Notes (Signed)
 [facility] [address] [place], [place] [address]  Pulmonary Sleep Medicine   Office Visit Note  Patient Name: Vanessa Evans DOB: 1946/01/29 MRN [id number]  Date of Service: 06/05/2024  Complaints/HPI: She has noted some drop in her oxygen  level has been dropping during the daytime. She apparently self checks. She has been breathing better since her cardiology status has improved. She has no chest pain noted. No palpitations. She recently had covid again. She has no leftover effects of the covid. Patient has no leg edema noted  Office Spirometry Results:     ROS  General: (-) fever, (-) chills, (-) night sweats, (-) weakness Skin: (-) rashes, (-) itching,. Eyes: (-) visual changes, (-) redness, (-) itching. Nose and Sinuses: (-) nasal stuffiness or itchiness, (-) postnasal drip, (-) nosebleeds, (-) sinus trouble. Mouth and Throat: (-) sore throat, (-) hoarseness. Neck: (-) swollen glands, (-) enlarged thyroid , (-) neck pain. Respiratory: + cough, (-) bloody sputum, + shortness of breath, - wheezing. Cardiovascular: - ankle swelling, (-) chest pain. Lymphatic: (-) lymph node enlargement. Neurologic: (-) numbness, (-) tingling. Psychiatric: (-) anxiety, (-) depression   Current Medication: Outpatient Encounter Medications as of 06/05/2024  Medication Sig Note   acetaminophen  (TYLENOL ) 500 MG tablet Take 1,000 mg by mouth 2 (two) times daily as needed for moderate pain.    acidophilus (RISAQUAD) CAPS capsule Take 1 capsule by mouth daily.    albuterol  (VENTOLIN  HFA) 108 (90 Base) MCG/ACT inhaler Inhale 2 puffs into the lungs every 6 (six) hours as needed for wheezing or shortness of breath.    apixaban  (ELIQUIS ) 5 MG TABS tablet Take 1 tablet by mouth 2 (two) times daily.    aspirin  EC 81 MG tablet Take 81 mg by mouth at bedtime.     benzonatate  (TESSALON ) 200 MG capsule Take 1 capsule (200 mg total) by mouth 3 (three) times daily as needed for cough.     bisacodyl  (DULCOLAX) 5 MG EC tablet Take 1 tablet (5 mg total) by mouth at bedtime as needed for moderate constipation.    BLACK ELDERBERRY PO Take 1 Dose by mouth 2 (two) times a day.    Calcium  Carbonate-Vitamin D  600-400 MG-UNIT tablet Take 1 tablet by mouth daily.    cholecalciferol  (VITAMIN D ) 1000 UNITS tablet Take 1,000 Units by mouth at bedtime.     citalopram  (CELEXA ) 20 MG tablet Take 1 tablet (20 mg total) by mouth daily.    cyanocobalamin  500 MCG tablet Take 500 mcg by mouth daily.    diclofenac  Sodium (VOLTAREN ) 1 % GEL Apply 4 g topically 4 (four) times daily. (Patient taking differently: Apply 4 g topically 4 (four) times daily as needed.)    diltiazem  (CARDIZEM  CD) 180 MG 24 hr capsule Take 1 capsule (180 mg total) by mouth daily. 10/01/2023: Patient states taking 120 mg 1 x day   diphenhydrAMINE  (BENADRYL ) 25 MG tablet Take 25-50 mg by mouth daily as needed for itching.    EPINEPHrine  (EPIPEN  2-PAK) 0.3 mg/0.3 mL IJ SOAJ injection use as directed for severe allergy reaction    famotidine  (PEPCID ) 20 MG tablet Take 20 mg by mouth 2 (two) times daily.    fenofibrate  160 MG tablet Take 1 tablet (160 mg total) by mouth daily. for cholesterol    fluconazole  (DIFLUCAN ) 150 MG tablet Take 1 tab po once may repeat in 3 days if symptoms persist    fluticasone  (FLONASE ) 50 MCG/ACT nasal spray Use 2 sprays in each nostril daily,  guaiFENesin  (MUCINEX ) 600 MG 12 hr tablet Take 600 mg by mouth at bedtime as needed (congestion).    Hypromellose (ARTIFICIAL TEARS OP) Place 1-2 drops into both eyes daily as needed (dry eyes).    ipratropium-albuterol  (DUONEB) 0.5-2.5 (3) MG/3ML SOLN Take 3 mLs by nebulization every 6 (six) hours as needed.    ketotifen  (ZADITOR ) 0.025 % ophthalmic solution Place 1 drop into both eyes 2 (two) times daily as needed (allergies).    levocetirizine (XYZAL ) 5 MG tablet Take 1 tablet (5 mg total) by mouth every evening.    levothyroxine  (SYNTHROID , LEVOTHROID) 50 MCG  tablet Take 50 mcg by mouth daily before breakfast. Brand Name only    lidocaine  (XYLOCAINE ) 5 % ointment Apply 1 Application topically as needed.    losartan  (COZAAR ) 25 MG tablet Take 25 mg by mouth daily.    Magnesium  250 MG TABS Take 1 tablet by mouth daily.    Melatonin 5 MG TABS Take 1 tablet by mouth at bedtime. For sleep    methocarbamol  (ROBAXIN ) 500 MG tablet TAKE ONE-HALF TO 1 TABLET TWICE DAILY AS NEEDED.    metoprolol  succinate (TOPROL -XL) 25 MG 24 hr tablet Take 1 tablet (25 mg total) by mouth 2 (two) times daily. 10/01/2023: Patient states taking 1/2 tablet in the morning 1/2 tablet in the evening.    Multiple Vitamins-Minerals (MULTIVITAMIN WITH MINERALS) tablet Take 1 tablet by mouth daily.    nystatin  (MYCOSTATIN ) 100000 UNIT/ML suspension Take 5 mLs (500,000 Units total) by mouth 4 (four) times daily.    nystatin  (MYCOSTATIN /NYSTOP ) powder Apply 1 Application topically 3 (three) times daily.    OXYGEN  Inhale into the lungs. 2 litre at night    pantoprazole  (PROTONIX ) 40 MG tablet Take 1 tablet (40 mg total) by mouth 2 (two) times daily.    polyethylene glycol powder (GLYCOLAX /MIRALAX ) 17 GM/SCOOP powder Take 17 g by mouth daily.    potassium chloride  SA (KLOR-CON  M) 20 MEQ tablet Take 20 mEq by mouth daily as needed.    raloxifene  (EVISTA ) 60 MG tablet Take 60 mg by mouth daily.    rosuvastatin  (CRESTOR ) 5 MG tablet Take 1 tablet (5 mg total) by mouth daily.    sodium chloride  (OCEAN) 0.65 % SOLN nasal spray Place 1 spray into both nostrils as needed for congestion.    spironolactone (ALDACTONE) 25 MG tablet Take 25 mg by mouth once.    torsemide (DEMADEX) 20 MG tablet Take 20 mg by mouth daily as needed. 10/01/2023: Patient states takes differently. Takes if weight gain of 2 lbs overnight, 3-4 pound in 1-2 days   Vitamin A 7.5 MG (25000 UT) CAPS Take 1 tablet by mouth 2 (two) times daily.    [DISCONTINUED] amoxicillin -clavulanate (AUGMENTIN ) 875-125 MG tablet Take 1 tablet by  mouth 2 (two) times daily.    [DISCONTINUED] fluticasone  furoate-vilanterol (BREO ELLIPTA ) 100-25 MCG/ACT AEPB Inhale 1 puff into the lungs daily.    [DISCONTINUED] predniSONE  (STERAPRED UNI-PAK 48 TAB) 10 MG (48) TBPK tablet Take as directed    No facility-administered encounter medications on file as of 06/05/2024.    Surgical History: Past Surgical History:  Procedure Laterality Date   BACK SURGERY  2014   removed bone to get to a benign tumor that was pushing on spinal column   BIOPSY THYROID   2018   bladder biopsies  2003   9 biopsies done by dr. cope.  all benign.  inflammatory process going on in bladder   BREAST BIOPSY Right    benign  BREAST SURGERY Right    lumpectomy   CATARACT EXTRACTION W/PHACO Left 12/25/2014   Procedure: CATARACT EXTRACTION PHACO AND INTRAOCULAR LENS PLACEMENT (IOC);  Surgeon: Elsie Carmine, MD;  Location: [facility] ORS;  Service: Ophthalmology;  Laterality: Left;  US  00:32 AP% 20.0 CDE 6.49   COLONOSCOPY W/ BIOPSIES     COLONOSCOPY W/ POLYPECTOMY  2002, 2004, 2005   adenomatous polyps removed   COLONOSCOPY WITH PROPOFOL  N/A 08/23/2017   Procedure: COLONOSCOPY WITH PROPOFOL ;  Surgeon: Viktoria Lamar DASEN, MD;  Location: [facility] ENDOSCOPY;  Service: Endoscopy;  Laterality: N/A;   COLONOSCOPY WITH PROPOFOL  N/A 11/08/2017   Procedure: COLONOSCOPY WITH PROPOFOL ;  Surgeon: Viktoria Lamar DASEN, MD;  Location: [facility] ENDOSCOPY;  Service: Endoscopy;  Laterality: N/A;   COLONOSCOPY WITH PROPOFOL  N/A 06/30/2021   Procedure: COLONOSCOPY WITH PROPOFOL ;  Surgeon: Maryruth Ole DASEN, MD;  Location: [facility] ENDOSCOPY;  Service: Endoscopy;  Laterality: N/A;   CYSTOCELE REPAIR N/A 04/09/2016   Procedure: ANTERIOR REPAIR (CYSTOCELE);  Surgeon: Lamar SHAUNNA Lesches, MD;  Location: [facility] ORS;  Service: Gynecology;  Laterality: N/A;   DIAGNOSTIC LAPAROSCOPY  2008   removed both ovaries with cysts, tubes and fibroids   DILATION AND CURETTAGE OF UTERUS  1973   ESOPHAGOGASTRODUODENOSCOPY (EGD)  WITH PROPOFOL  N/A 08/23/2017   Procedure: ESOPHAGOGASTRODUODENOSCOPY (EGD) WITH PROPOFOL ;  Surgeon: Viktoria Lamar DASEN, MD;  Location: [facility] ENDOSCOPY;  Service: Endoscopy;  Laterality: N/A;   ESOPHAGOGASTRODUODENOSCOPY (EGD) WITH PROPOFOL  N/A 06/30/2021   Procedure: ESOPHAGOGASTRODUODENOSCOPY (EGD) WITH PROPOFOL ;  Surgeon: Maryruth Ole DASEN, MD;  Location: [facility] ENDOSCOPY;  Service: Endoscopy;  Laterality: N/A;   EYE SURGERY Bilateral 2016   HERNIA REPAIR Right 2008   Inguinal hernia Repair, ventral hernia repair   IR KYPHO THORACIC WITH BONE BIOPSY  05/12/2022   IR RADIOLOGIST EVAL & MGMT  05/19/2022   IR RADIOLOGIST EVAL & MGMT  05/26/2022   JOINT REPLACEMENT Right 2008   knee   KNEE ARTHROSCOPY Right    LUMBAR LAMINECTOMY/DECOMPRESSION MICRODISCECTOMY Right 03/21/2013   Procedure: Right Lumbar five-Sacral one Laminectomy for Synovial Cyst;  Surgeon: Darina MALVA Boehringer, MD;  Location: [facility]URO OR[facility];  Service: Neurosurgery;  Laterality: Right;  right   OOPHORECTOMY     REVERSE SHOULDER ARTHROPLASTY Right 02/22/2018   Procedure: REVERSE SHOULDER ARTHROPLASTY;  Surgeon: Edie Norleen PARAS, MD;  Location: [facility] ORS;  Service: Orthopedics;  Laterality: Right;   TUBAL LIGATION     UNILATERAL SALPINGECTOMY Left 04/09/2016   Procedure: UNILATERAL SALPINGECTOMY;  Surgeon: Lamar SHAUNNA Lesches, MD;  Location: [facility] ORS;  Service: Gynecology;  Laterality: Left;   UPPER GI ENDOSCOPY  2010   with biopsy of gastric erosion   VAGINAL HYSTERECTOMY N/A 04/09/2016   Procedure: HYSTERECTOMY VAGINAL;  Surgeon: Lamar SHAUNNA Lesches, MD;  Location: [facility] ORS;  Service: Gynecology;  Laterality: N/A;   VAGINAL HYSTERECTOMY  2017   and bladder tack    Medical History: Past Medical History:  Diagnosis Date   Arthritis    osteo   Asthma    needs rescue inhaler few times a year   Claustrophobia 04/21/2020   Depression    Dizziness    light headed spells but it has been a while   Dysrhythmia    tachycardia.(cardizem ). brady during  procedures   Fibromyalgia    GERD (gastroesophageal reflux disease)    gastritis   Headache(784.0)    Heart murmur 2019   aortic. dr. hester not worried about this   Hyperlipidemia    Hypertension    Hypothyroidism  Macular degeneration    Migraine    Orthopnea    Pneumonia    long time ago (greater than 5 years ago)   PONV (postoperative nausea and vomiting)    very anxious during cataract surgery. brady during colonoscopy   Shortness of breath    any exertion, cannot lie flat    Family History: Family History  Problem Relation Age of Onset   Pulmonary fibrosis Mother    Melanoma Father    COPD Sister    Cardiomyopathy Sister        and arrhythmia   Atrial fibrillation Sister    COPD Brother    Pulmonary fibrosis Brother    Cancer Brother    Breast cancer Cousin        paternal 1st    Social History: Social History   Socioeconomic History   Marital status: Married    Spouse name: Not on file   Number of children: Not on file   Years of education: Not on file   Highest education level: Not on file  Occupational History   Not on file  Tobacco Use   Smoking status: Former    Current packs/day: 0.00    Types: Cigarettes    Quit date: 1967    Years since quitting: 58.8   Smokeless tobacco: Never  Vaping Use   Vaping status: Never Used  Substance and Sexual Activity   Alcohol  use: Yes    Alcohol /week: 1.0 standard drink of alcohol     Types: 1 Glasses of wine per week    Comment: occasionally   Drug use: No   Sexual activity: Not Currently  Other Topics Concern   Not on file  Social History Narrative   Not on file   Social Drivers of Health   Financial Resource Strain: Not on file  Food Insecurity: No Food Insecurity (10/01/2023)   Hunger Vital Sign    Worried About Running Out of Food in the Last Year: Never true    Ran Out of Food in the Last Year: Never true  Transportation Needs: No Transportation Needs (10/01/2023)   PRAPARE -  Administrator, Civil Service (Medical): No    Lack of Transportation (Non-Medical): No  Physical Activity: Not on file  Stress: Not on file  Social Connections: Unknown (09/12/2023)   Social Connection and Isolation Panel    Frequency of Communication with Friends and Family: More than three times a week    Frequency of Social Gatherings with Friends and Family: More than three times a week    Attends Religious Services: More than 4 times per year    Active Member of [employer] or Organizations: Patient unable to answer    Attends Banker Meetings: Patient unable to answer    Marital Status: Married  Catering Manager Violence: Not At Risk (10/01/2023)   Humiliation, Afraid, Rape, and Kick questionnaire    Fear of Current or Ex-Partner: No    Emotionally Abused: No    Physically Abused: No    Sexually Abused: No    Vital Signs: Blood pressure 130/74, pulse 82, temperature 98.4 F (36.9 C), resp. rate 16, height 5' 1 (1.549 m), weight 140 lb 4.8 oz (63.6 kg), SpO2 98%.  Examination: General Appearance: The patient is well-developed, well-nourished, and in no distress. Skin: Gross inspection of skin unremarkable. Head: normocephalic, no gross deformities. Eyes: no gross deformities noted. ENT: ears appear grossly normal no exudates. Neck: Supple. No thyromegaly. No LAD. Respiratory: no  rhonchi noted. Cardiovascular: Normal S1 and S2 without murmur or rub. Extremities: No cyanosis. pulses are equal. Neurologic: Alert and oriented. No involuntary movements.  LABS: No results found for this or any previous visit (from the past 2160 hours).  Radiology: DG Chest 2 View Result Date: 10/18/2023 CLINICAL DATA:  Rule out pneumonia. EXAM: CHEST - 2 VIEW COMPARISON:  Chest radiograph dated 09/17/2023 FINDINGS: No focal consolidation, pleural effusion, or pneumothorax. The cardiac silhouette is within normal limits. Osteopenia with degenerative changes spine. Lower  thoracic old compression fracture and vertebroplasty. Right shoulder arthroplasty. No acute osseous pathology. IMPRESSION: No active cardiopulmonary disease. Electronically Signed   By: Vanetta Chou M.D.   On: 10/18/2023 15:41    No results found.  No results found.  Assessment and Plan: Patient Active Problem List   Diagnosis Date Noted   Acute hypoxic respiratory failure (HCC) 09/10/2023   Atrial fibrillation with RVR (HCC) 09/10/2023   Influenza A with pneumonia 09/10/2023   Asthma exacerbation 09/10/2023   (HFpEF) heart failure with preserved ejection fraction (HCC) 09/10/2023   Lymphedema 01/18/2022   Coronary artery disease involving native coronary artery of native heart with angina pectoris 10/28/2020   Peptic ulcer disease 04/21/2020   Claustrophobia 04/21/2020   Urinary tract infection without hematuria 02/04/2020   Pain in both lower extremities 12/18/2019   Inflammatory polyarthritis (HCC) 07/26/2019   Excessive daytime sleepiness 07/26/2019   Encounter for screening mammogram for malignant neoplasm of breast 07/26/2019   Intercostal muscle pain 06/25/2019   Positive ANA (antinuclear antibody) 05/01/2019   Primary osteoarthritis involving multiple joints 04/26/2019   Mild intermittent asthma without complication 08/22/2018   Seasonal allergic rhinitis due to pollen 08/22/2018   Chills with fever 08/11/2018   Sore throat 08/11/2018   Acute upper respiratory infection 08/11/2018   Candidiasis 08/11/2018   Iron deficiency anemia 05/05/2018   Right hip pain 04/27/2018   Accidental fall from furniture 04/27/2018   Vitamin D  deficiency 04/27/2018   Cough 04/18/2018   Dysuria 04/18/2018   Status post reverse total shoulder replacement, right 02/22/2018   Abnormal ECG 02/18/2018   Pain in joint of right shoulder 02/13/2018   Abdominal aortic atherosclerosis 02/13/2018   Episode of recurrent major depressive disorder 02/13/2018   Encounter for general adult medical  examination with abnormal findings 02/13/2018   Lipoma of right shoulder 01/31/2018   Rotator cuff tendinitis, right 01/31/2018   Chronic superficial gastritis without bleeding 12/12/2017   Schatzki's ring 12/12/2017   Acute gastritis 11/08/2017   Nausea 10/18/2017   Other fatigue 10/18/2017   Allergic rhinitis 09/20/2017   Eczema 09/20/2017   Hematuria 09/20/2017   Mixed hyperlipidemia 09/20/2017   Osteoarthritis 09/20/2017   Osteoporosis, post-menopausal 09/20/2017   Palpitations 07/05/2017   Epigastric pain 05/24/2017   Gastroesophageal reflux disease without esophagitis 05/24/2017   Impingement syndrome of left shoulder 10/02/2016   Trochanteric bursitis of left hip 10/02/2016   SOB (shortness of breath) 04/16/2016   Uterine prolapse 04/09/2016   Cystocele 04/09/2016   Hyponatremia 04/09/2016   Benign essential HTN 11/18/2015   Paroxysmal supraventricular tachycardia 11/18/2015   Premature ventricular contraction 07/20/2014   History of colonic polyps 05/29/2014   Asthma 02/05/2014   Chest pain 02/05/2014   Increased frequency of urination 02/05/2014   Tachycardia 02/05/2014   Female stress incontinence 12/14/2013   Gross hematuria 12/14/2013   Incomplete emptying of bladder 12/14/2013   Other chronic cystitis without hematuria 12/14/2013    1. Chronic asthma, mild intermittent, uncomplicated (Primary) She  states she has desats. Will check a walk and see if there is any desats noted - 6 minute walk; Future  2. Gastroesophageal reflux disease without esophagitis PPI as ordered will continue with current dosages  3. Coronary artery disease due to lipid rich plaque Following with cardiology has been stable and she states she has no new issues with chest pain etc    General Counseling: I have discussed the findings of the evaluation and examination with [place].  I have also discussed any further diagnostic evaluation thatmay be needed or ordered today. Keonta  verbalizes understanding of the findings of todays visit. We also reviewed her medications today and discussed drug interactions and side effects including but not limited excessive drowsiness and altered mental states. We also discussed that there is always a risk not just to her but also people around her. she has been encouraged to call the office with any questions or concerns that should arise related to todays visit.  No orders of the defined types were placed in this encounter.    Time spent: 64  I have personally obtained a history, examined the patient, evaluated laboratory and imaging results, formulated the assessment and plan and placed orders.    Elfreda DELENA Bathe, MD [facility] Pulmonary and Critical Care Sleep medicine

## 2024-06-05 NOTE — Patient Instructions (Signed)
 Asthma, Adult  Asthma is a condition that causes swelling and narrowing of the airways. These are the passages that lead from the nose and mouth down into the lungs. When asthma symptoms get worse it is called an asthma attack or flare. This can make it hard to breathe. Asthma flares can range from minor to life-threatening. There is no cure for asthma, but medicines and lifestyle changes can help to control it. What are the causes? It is not known exactly what causes asthma, but certain things can cause asthma symptoms to get worse (triggers). What can trigger an asthma attack? Cigarette smoke. Mold. Dust. Your pet's skin flakes (dander). Cockroaches. Pollen. Air pollution (like household cleaners, wood smoke, smog, or Therapist, occupational). What are the signs or symptoms? Trouble breathing (shortness of breath). Coughing. Making high-pitched whistling sounds when you breathe, most often when you breathe out (wheezing). Chest tightness. Tiredness with little activity. Poor exercise tolerance. How is this treated? Controller medicines that help prevent asthma symptoms. Fast-acting reliever or rescue medicines. These give short-term relief of asthma symptoms. Allergy medicines if your attacks are brought on by allergens. Medicines to help control the body's defense (immune) system. Staying away from the things that cause asthma attacks. Follow these instructions at home: Avoiding triggers in your home Do not allow anyone to smoke in your home. Limit use of fireplaces and wood stoves. Get rid of pests (such as roaches and mice) and their droppings. Keep your home clean. Clean your floors. Dust regularly. Use cleaning products that do not smell. Wash bed sheets and blankets every week in hot water. Dry them in a dryer. Have someone vacuum when you are not home. Change your heating and air conditioning filters often. Use blankets that are made of polyester or cotton. General  instructions Take over-the-counter and prescription medicines only as told by your doctor. Do not smoke or use any products that contain nicotine or tobacco. If you need help quitting, ask your doctor. Stay away from secondhand smoke. Avoid doing things outdoors when allergen counts are high and when air quality is low. Warm up before you exercise. Take time to cool down after exercise. Use a peak flow meter as told by your doctor. A peak flow meter is a tool that measures how well your lungs are working. Keep track of the peak flow meter's readings. Write them down. Follow your asthma action plan. This is a written plan for taking care of your asthma and treating your attacks. Make sure you get all the shots (vaccines) that your doctor recommends. Ask your doctor about a flu shot and a pneumonia shot. Keep all follow-up visits. Contact a doctor if: You have wheezing, shortness of breath, or a cough even while taking medicine to prevent attacks. The mucus you cough up (sputum) is thicker than usual. The mucus you cough up changes from clear or white to yellow, green, gray, or is bloody. You have problems from the medicine you are taking, such as: A rash. Itching. Swelling. Trouble breathing. You need reliever medicines more than 2-3 times a week. Your peak flow reading is still at 50-79% of your personal best after following the action plan for 1 hour. You have a fever. Get help right away if: You seem to be worse and are not responding to medicine during an asthma attack. You are short of breath even at rest. You get short of breath when doing very little activity. You have trouble eating, drinking, or talking. You have chest  pain or tightness. You have a fast heartbeat. Your lips or fingernails start to turn blue. You are light-headed or dizzy, or you faint. Your peak flow is less than 50% of your personal best. You feel too tired to breathe normally. These symptoms may be an  emergency. Get help right away. Call 911. Do not wait to see if the symptoms will go away. Do not drive yourself to the hospital. Summary Asthma is a long-term (chronic) condition in which the airways get tight and narrow. An asthma attack can make it hard to breathe. Asthma cannot be cured, but medicines and lifestyle changes can help control it. Make sure you understand how to avoid triggers and how and when to use your medicines. Avoid things that can cause allergy symptoms (allergens). These include animal skin flakes (dander) and pollen from trees or grass. Avoid things that pollute the air. These may include household cleaners, wood smoke, smog, or chemical odors. This information is not intended to replace advice given to you by your health care provider. Make sure you discuss any questions you have with your health care provider. Document Revised: 05/05/2021 Document Reviewed: 05/05/2021 Elsevier Patient Education  2024 [employer].

## 2024-06-05 NOTE — Addendum Note (Signed)
 Addended by: ALMER BI on: 06/05/2024 11:57 AM   Modules accepted: Orders

## 2024-06-08 ENCOUNTER — Ambulatory Visit: Admitting: Obstetrics

## 2024-06-08 ENCOUNTER — Encounter: Payer: Self-pay | Admitting: Obstetrics

## 2024-06-08 VITALS — BP 126/73 | HR 88 | Ht 59.0 in | Wt 139.0 lb

## 2024-06-08 DIAGNOSIS — Z9889 Other specified postprocedural states: Secondary | ICD-10-CM

## 2024-06-08 DIAGNOSIS — N952 Postmenopausal atrophic vaginitis: Secondary | ICD-10-CM

## 2024-06-08 DIAGNOSIS — N8110 Cystocele, unspecified: Secondary | ICD-10-CM | POA: Diagnosis not present

## 2024-06-08 DIAGNOSIS — N941 Unspecified dyspareunia: Secondary | ICD-10-CM

## 2024-06-08 DIAGNOSIS — R159 Full incontinence of feces: Secondary | ICD-10-CM | POA: Diagnosis not present

## 2024-06-08 DIAGNOSIS — R339 Retention of urine, unspecified: Secondary | ICD-10-CM

## 2024-06-08 DIAGNOSIS — R351 Nocturia: Secondary | ICD-10-CM

## 2024-06-08 DIAGNOSIS — N3946 Mixed incontinence: Secondary | ICD-10-CM

## 2024-06-08 MED ORDER — ESTRADIOL 0.01 % VA CREA: TOPICAL_CREAM | VAGINAL | 11 refills | Status: AC

## 2024-06-08 NOTE — Assessment & Plan Note (Signed)
-   h/o anterior repair at the time of [facility] for AUB-L and urinary incontinence by Dr. Arloa in 04/09/2016 complicated by urinary retention. Op note noted 1-0 Ethibond sutures used. - concerns of incomplete bladder emptying with catheterized PVR .  - will need urodynamics prior to surgical intervention or if persistent symptoms

## 2024-06-08 NOTE — Assessment & Plan Note (Signed)
-   For symptomatic vaginal atrophy options include lubrication with a water-based lubricant, personal hygiene measures and barrier protection against wetness, and estrogen replacement in the form of vaginal cream, vaginal tablets, or a time-released vaginal ring.  - likely contributing to postcoital spotting and dyspareunia - Rx to start low dose vaginal estrogen

## 2024-06-08 NOTE — Progress Notes (Signed)
 New Patient Evaluation and Consultation  Referring Provider: Kristina Tinnie POUR, PA* PCP: Kristina Tinnie POUR, PA-C Date of Service: 06/08/2024  SUBJECTIVE Chief Complaint: Dyspareunia  History of Present Illness: Vanessa Evans is a 78 y.o. White or Caucasian female seen in consultation at the request of PA McDonough for evaluation of pelvic organ prolapse, urinary incontinence, dyspareunia and postcoital spotting.    Unable to tolerate pessary for prolapse and incontinence in the past prior to surgery due to discomfort or OAB medications due to side effects Underwent laparoscopic RSO, left oophorectomy in around 2008 for ovarian cyst, complicated by incisional hernia repaired by Dr. Dicie without mesh at [facility] Underwent vaginal hysterectomy, left salpingectomy and anterior repair for AUB-L and urinary incontinence by Dr. Arloa in 04/09/2016, complicated by urinary retention. Review of op report noted The uterosacrals were plicated using a 1-0 Ethibond suture Vaginal bulge noted to vaginal opening around the onset of urinary leakage. Denies pain, discharge, or bleeding, however reports some discomfort during intercourse and some spotting postcoitally. Uses lubrication with some relief and vaseline for barrier ointment.  Reports increased yeast infection since starting Jardiance.   Prior negative bladder biopsies that showed  inflammatory response by Dr. Ike (urology) in 2004 or 2005 for gross hematuria and recurrent UTIs. Takes KCl Recently hospitalization in 09/10/2023-09/17/23 for Flu A PNA, acute hypoxic respiratory failure, asthma exacerbation, and HFpEF. Attributed mood disorder after oldest son passed away 2 years ago   Review of records significant for: Heart failure, A. Fib with RVR on eliquis , HTN on Torsemide and spironolactone, MDD, lymphedema, migraine, PONV, SLE, diverticulosis with diverticulitis, anemia  Urinary Symptoms: Leaks urine with cough/ sneeze, laughing,  going from sitting to standing, with a full bladder, with movement to the bathroom, with urgency, and night time leakage when she gets up to void started within 1 year Most bothered by night time leakage 2x/night Leaks 2-3 time(s) per days with urgency Leaks 1-2x/day with coughing/laughing, usually able to get to the restroom Pad use: 2 pads per day.   Patient is bothered by UI symptoms.  Day time voids 17-20.  Nocturia: 2 times per night to void. Takes melatonin for sleep with CHF Uses supplemental O2, reports history of negative sleep study History of leg swelling, improved since Jardiance for CHF Takes a sip of fluid throughout the night due to dry mouth Voiding dysfunction:  does not empty bladder well.  Patient does not use a catheter to empty bladder.  When urinating, patient feels a weak stream, difficulty starting urine stream, dribbling after finishing, the need to urinate multiple times in a row, and to push on her belly or vagina to empty bladder Drinks: 48oz water per day, 8-10oz of coffee, 8-12oz tea, 8oz pepsi zero  UTIs: 1-2 UTI's in the last year attributed to starting Jardiance for CHF around 02/2024 along with yeast infections. Takes probiotics twice daily Denies history of kidney or bladder stones, pyelonephritis, bladder cancer, and kidney cancer Prior evaluation for gross hematuria and recurrent UTIs in 50s. No results found for the last 90 days.   Pelvic Organ Prolapse Symptoms:                  Patient Admits to a feeling of a bulge the vaginal area. It has been present for 1 years.  Patient Denies seeing a bulge.  This bulge is not bothersome.  Bowel Symptom: Bowel movements: 1-2 time(s) per day Reports diarrhea for years followed by constipation Stool consistency: hard or liquid,  mostly soft daily with loose, watery diarrhea or with straining Hard stool 1x/week with type II stool managed by stool softener and senna and miralax  Mostly Type III-IV stool, with  intermittent Type VI-VII stool Straining: yes.  Splinting: no.  Incomplete evacuation: yes.  Patient Admits to accidental bowel leakage / fecal incontinence with diarrhea or during void  Occurs: 3-4 time(s) per month when she sees stool when she wipes postvoid, reports 1-2x/year with fecal leakage after diarrhea mostly recently after discharge from hospital  Consistency with leakage: soft  or liquid Bowel regimen: fiber, 2 stool softeners/night, and 1-2 teaspoon miralax  Last colonoscopy: Results polyps resected, diverticulosis, internal hemorrhoids. Discontinued.  HM Colonoscopy          Completed or No Longer Recommended     Colonoscopy  Discontinued      Frequency changed to Never automatically (Topic No Longer Applies)   06/30/2021  COLONOSCOPY   Only the first 1 history entries have been loaded, but more history exists.                Sexual Function Sexually active: yes.  Sexual orientation: Straight Pain with sex: Yes, at the vaginal opening, deep in the pelvis, crack at the top or on sides of her crotch managed by neosporin  usually at the start of yeast infection Uses lubricant for thin and dry skin  Pelvic Pain Denies pelvic pain   Past Medical History:  Past Medical History:  Diagnosis Date   Arthritis    osteo   Asthma    needs rescue inhaler few times a year   Atrial fib/flutter, transient (HCC)    Claustrophobia 04/21/2020   Depression    Dizziness    light headed spells but it has been a while   Dysrhythmia    tachycardia.(cardizem ). brady during procedures   Fibromyalgia    GERD (gastroesophageal reflux disease)    gastritis   Headache(784.0)    Heart failure (HCC)    Heart murmur 2019   aortic. dr. hester not worried about this   Hyperlipidemia    Hypertension    Hypothyroidism    Macular degeneration    Migraine    Orthopnea    Pneumonia    long time ago (greater than 5 years ago)   PONV (postoperative nausea and vomiting)     very anxious during cataract surgery. brady during colonoscopy   Shortness of breath    any exertion, cannot lie flat     Past Surgical History:   Past Surgical History:  Procedure Laterality Date   BACK SURGERY  2014   removed bone to get to a benign tumor that was pushing on spinal column   BIOPSY THYROID   2018   bladder biopsies  2003   9 biopsies done by dr. cope.  all benign.  inflammatory process going on in bladder   BREAST BIOPSY Right    benign   BREAST SURGERY Right    lumpectomy   CATARACT EXTRACTION W/PHACO Left 12/25/2014   Procedure: CATARACT EXTRACTION PHACO AND INTRAOCULAR LENS PLACEMENT (IOC);  Surgeon: Elsie Carmine, MD;  Location: [facility] ORS;  Service: Ophthalmology;  Laterality: Left;  US  00:32 AP% 20.0 CDE 6.49   COLONOSCOPY W/ BIOPSIES     COLONOSCOPY W/ POLYPECTOMY  2002, 2004, 2005   adenomatous polyps removed   COLONOSCOPY WITH PROPOFOL  N/A 08/23/2017   Procedure: COLONOSCOPY WITH PROPOFOL ;  Surgeon: Viktoria Lamar DASEN, MD;  Location: [facility] ENDOSCOPY;  Service: Endoscopy;  Laterality: N/A;   COLONOSCOPY  WITH PROPOFOL  N/A 11/08/2017   Procedure: COLONOSCOPY WITH PROPOFOL ;  Surgeon: Viktoria Lamar DASEN, MD;  Location: [facility] ENDOSCOPY;  Service: Endoscopy;  Laterality: N/A;   COLONOSCOPY WITH PROPOFOL  N/A 06/30/2021   Procedure: COLONOSCOPY WITH PROPOFOL ;  Surgeon: Maryruth Ole DASEN, MD;  Location: [facility] ENDOSCOPY;  Service: Endoscopy;  Laterality: N/A;   CYSTOCELE REPAIR N/A 04/09/2016   Procedure: ANTERIOR REPAIR (CYSTOCELE);  Surgeon: Lamar SHAUNNA Lesches, MD;  Location: [facility] ORS;  Service: Gynecology;  Laterality: N/A;   DIAGNOSTIC LAPAROSCOPY  2008   removed both ovaries with cysts, tubes and fibroids   DILATION AND CURETTAGE OF UTERUS  1973   ESOPHAGOGASTRODUODENOSCOPY (EGD) WITH PROPOFOL  N/A 08/23/2017   Procedure: ESOPHAGOGASTRODUODENOSCOPY (EGD) WITH PROPOFOL ;  Surgeon: Viktoria Lamar DASEN, MD;  Location: [facility] ENDOSCOPY;  Service: Endoscopy;  Laterality: N/A;    ESOPHAGOGASTRODUODENOSCOPY (EGD) WITH PROPOFOL  N/A 06/30/2021   Procedure: ESOPHAGOGASTRODUODENOSCOPY (EGD) WITH PROPOFOL ;  Surgeon: Maryruth Ole DASEN, MD;  Location: [facility] ENDOSCOPY;  Service: Endoscopy;  Laterality: N/A;   EYE SURGERY Bilateral 2016   HERNIA REPAIR Right 2008   Inguinal hernia Repair, ventral hernia repair   IR KYPHO THORACIC WITH BONE BIOPSY  05/12/2022   IR RADIOLOGIST EVAL & MGMT  05/19/2022   IR RADIOLOGIST EVAL & MGMT  05/26/2022   JOINT REPLACEMENT Right 2008   knee   KNEE ARTHROSCOPY Right    LUMBAR LAMINECTOMY/DECOMPRESSION MICRODISCECTOMY Right 03/21/2013   Procedure: Right Lumbar five-Sacral one Laminectomy for Synovial Cyst;  Surgeon: Darina MALVA Boehringer, MD;  Location: [facility]URO ORS;  Service: Neurosurgery;  Laterality: Right;  right   OOPHORECTOMY     REVERSE SHOULDER ARTHROPLASTY Right 02/22/2018   Procedure: REVERSE SHOULDER ARTHROPLASTY;  Surgeon: Edie Norleen PARAS, MD;  Location: [facility] ORS;  Service: Orthopedics;  Laterality: Right;   TUBAL LIGATION     UNILATERAL SALPINGECTOMY Left 04/09/2016   Procedure: UNILATERAL SALPINGECTOMY;  Surgeon: Lamar SHAUNNA Lesches, MD;  Location: [facility] ORS;  Service: Gynecology;  Laterality: Left;   UPPER GI ENDOSCOPY  2010   with biopsy of gastric erosion   VAGINAL HYSTERECTOMY N/A 04/09/2016   Procedure: HYSTERECTOMY VAGINAL;  Surgeon: Lamar SHAUNNA Lesches, MD;  Location: [facility] ORS;  Service: Gynecology;  Laterality: N/A;   VAGINAL HYSTERECTOMY  2017   and bladder tack     Past OB/GYN History: OB History  Gravida Para Term Preterm AB Living  4 3 3  1 3   SAB IAB Ectopic Multiple Live Births  1    3    # Outcome Date GA Lbr Len/2nd Weight Sex Type Anes PTL Lv  4 Term     F    LIV  3 Term     M Vag-Spont   LIV  2 SAB           1 Term     M Vag-Spont   LIV    Vaginal deliveries: largest infant 9lb4oz, perineal laceration repaired. Forceps/ Vacuum deliveries: 0, Cesarean section: 0 Menopausal: Yes, at age mid 72s, Admits to vaginal  bleeding since menopause after intercourse Contraception: s/p menopause and hysterectomy. Last pap smear was unknown.  Any history of abnormal pap smears: no. No results found for: DIAGPAP, HPVHIGH, ADEQPAP  Medications: Patient has a current medication list which includes the following prescription(s): acetaminophen , acidophilus, apixaban , aspirin  ec, bisacodyl , black elderberry, calcium  carbonate-vitamin d , cholecalciferol , citalopram , cyanocobalamin , diclofenac  sodium, diltiazem , diphenhydramine , empagliflozin, epinephrine , estradiol , famotidine , fenofibrate , fluticasone , trelegy ellipta, guaifenesin , carboxymethylcellulose sodium, ipratropium-albuterol , ketotifen , levocetirizine, levothyroxine , lidocaine , losartan , magnesium , melatonin, methocarbamol , metoprolol  succinate,  multiple vitamins-minerals, multivitamin with minerals, oxygen -helium, polyethylene glycol powder, potassium chloride  sa, raloxifene , rosuvastatin , sodium chloride , spironolactone, vitamin a, albuterol , cephalexin , esomeprazole, and phenazopyridine .   Allergies: Patient is allergic to codeine, levofloxacin, oxycodone , ultram [tramadol], nitrofurantoin, nsaids, sulfacetamide, adhesive [tape], azithromycin , ciprofloxacin, ketorolac, sulfa antibiotics, tolmetin, and zofran  [ondansetron  hcl].   Social History:  Social History   Tobacco Use   Smoking status: Former    Current packs/day: 0.00    Types: Cigarettes    Quit date: 1967    Years since quitting: 58.8   Smokeless tobacco: Never  Vaping Use   Vaping status: Never Used  Substance Use Topics   Alcohol  use: Yes    Alcohol /week: 1.0 standard drink of alcohol     Types: 1 Glasses of wine per week    Comment: occasionally   Drug use: No    Relationship status: married Patient lives with her husband of 60 years and 31yo grandson.   Patient is not employed. Regular exercise: No History of abuse: No  Family History:   Family History  Problem Relation Age of  Onset   Pulmonary fibrosis Mother    Melanoma Father    COPD Sister    Cardiomyopathy Sister        and arrhythmia   Atrial fibrillation Sister    COPD Brother    Pulmonary fibrosis Brother    Cancer Brother    Breast cancer Cousin        paternal 1st   Bladder Cancer Neg Hx    Uterine cancer Neg Hx    Renal cancer Neg Hx      Review of Systems: Review of Systems  Constitutional:  Positive for malaise/fatigue. Negative for fever and weight loss.  Respiratory:  Positive for shortness of breath. Negative for cough and wheezing.   Cardiovascular:  Negative for chest pain, palpitations and leg swelling.  Gastrointestinal:  Negative for abdominal pain and blood in stool.  Genitourinary:  Positive for frequency and urgency. Negative for dysuria and hematuria.       Leakage, bulge  Skin:  Negative for rash.  Neurological:  Positive for headaches. Negative for dizziness and weakness.  Endo/Heme/Allergies:  Bruises/bleeds easily.       Hot flashes  Psychiatric/Behavioral:  Positive for depression. The patient is not nervous/anxious.      OBJECTIVE Physical Exam: Vitals:   06/08/24 1425  BP: 126/73  Pulse: 88  Weight: 139 lb (63 kg)  Height: 4' 11 (1.499 m)    Physical Exam Constitutional:      General: She is not in acute distress.    Appearance: Normal appearance.  Genitourinary:     Bladder and urethral meatus normal.     No lesions in the vagina.     Right Labia: No rash, tenderness, lesions, skin changes or Bartholin's cyst.    Left Labia: No tenderness, lesions, skin changes, Bartholin's cyst or rash.    No vaginal discharge, erythema, tenderness, bleeding, ulceration or granulation tissue.     Anterior, posterior and apical vaginal prolapse present.    Severe vaginal atrophy present.     Right Adnexa: not tender, not full and no mass present.    Left Adnexa: not tender, not full and no mass present.    Cervix is absent.     Uterus is absent.     Urethral  meatus caruncle not present.    No urethral prolapse, tenderness, mass, hypermobility, discharge or stress urinary incontinence with cough stress test present.  Bladder is not tender, urgency on palpation not present and masses not present.      Pelvic Floor: Levator muscle strength is 3/5.    Obturator internus is tender (R > L).     Levator ani not tender, no asymmetrical contractions present and no pelvic spasms present.    Symmetrical pelvic sensation, anal wink present and BC reflex present. Cardiovascular:     Rate and Rhythm: Normal rate.  Pulmonary:     Effort: Pulmonary effort is normal. No respiratory distress.  Abdominal:     General: There is no distension.     Palpations: Abdomen is soft. There is no mass.     Tenderness: There is no abdominal tenderness.     Hernia: No hernia is present.   Neurological:     Mental Status: She is alert.  Vitals reviewed. Exam conducted with a chaperone present.      POP-Q:   POP-Q  -1                                            Aa   -1                                           Ba  -4                                              C   2                                            Gh  4                                            Pb  6                                            tvl   -1                                            Ap  -1                                            Bp                                                 D    Post-Void Residual (PVR) by Bladder Scan: In order to evaluate bladder emptying, we discussed obtaining  a postvoid residual and patient agreed to this procedure.  Procedure: The ultrasound unit was placed on the patient's abdomen in the suprapubic region after the patient had voided.    Post Void Residual - 06/08/24 1445       Post Void Residual   Post Void Residual 23 mL         Straight Catheterization Procedure for PVR: After verbal consent was obtained from the patient for  catheterization to assess bladder emptying and residual volume the urethra and surrounding tissues were prepped with betadine  and an in and out catheterization was performed.  PVR was .  Urine appeared clear yellow. The patient tolerated the procedure well.    Laboratory Results: Lab Results  Component Value Date   COLORU yellow 06/12/2024   CLARITYU clear 06/12/2024   GLUCOSEUR =500 (A) 06/12/2024   BILIRUBINUR negative 06/12/2024   KETONESU Negative 10/28/2023   SPECGRAV 1.015 06/12/2024   RBCUR negative 06/12/2024   PHUR 7.0 06/12/2024   PROTEINUR TRACE (A) 06/10/2024   UROBILINOGEN 0.2 06/12/2024   LEUKOCYTESUR Negative 06/12/2024    Lab Results  Component Value Date   CREATININE 0.61 09/17/2023   CREATININE 0.59 09/16/2023   CREATININE 0.63 09/15/2023    Lab Results  Component Value Date   HGBA1C 5.8 (A) 02/24/2024    Lab Results  Component Value Date   HGB 11.9 (L) 02/18/2024     ASSESSMENT AND PLAN Ms. Borelli is a 78 y.o. with:  1. Vaginal atrophy   2. Urinary incontinence, mixed   3. Incontinence of feces, unspecified fecal incontinence type   4. Pelvic organ prolapse quantification stage 2 cystocele   5. Incomplete emptying of bladder   6. Dyspareunia, female   7. History of pelvic surgery   8. Nocturia     Vaginal atrophy Assessment & Plan: - For symptomatic vaginal atrophy options include lubrication with a water-based lubricant, personal hygiene measures and barrier protection against wetness, and estrogen replacement in the form of vaginal cream, vaginal tablets, or a time-released vaginal ring.  - likely contributing to postcoital spotting and dyspareunia - Rx to start low dose vaginal estrogen   Orders: -     Estradiol ; Place 0.5g nightly for two weeks then twice a week after  Dispense: 30 g; Refill: 11 -     AMB referral to rehabilitation  Urinary incontinence, mixed Assessment & Plan: - POCT UA negative with elevated PVR ,  repeat prior to treatment - discussed splinting to ensure bladder emptying - most bothered by night time symptoms and urgency, discussed association with heart failure and glycemic control - encouraged fluid management and reduction of caffeine  - repeat PVR at follow-up   Orders: -     POCT URINALYSIS DIP (CLINITEK) -     AMB referral to rehabilitation  Incontinence of feces, unspecified fecal incontinence type Assessment & Plan: - reports chronic diarrhea cycles with constipation managed by GI - uses stool softeners, 1-2 teaspoons of miralax  with senna 1x/week - Treatment options include anti-diarrhea medication (loperamide/ Imodium OTC or prescription lomotil), fiber supplements, physical therapy, and possible sacral neuromodulation or surgery.   - encouraged titration of miralax  to reduce senna use to optimize stool consistency - referral to pelvic floor PT  Orders: -     AMB referral to rehabilitation  Pelvic organ prolapse quantification stage 2 cystocele Assessment & Plan: - minimal bother - h/o anterior repair at the time of [facility] for AUB-L and urinary incontinence by Dr. Arloa in 04/09/2016  complicated by urinary retention. Op note noted 1-0 Ethibond sutures used. - concerns of incomplete bladder emptying with catheterized PVR .  - pessary trial prior to surgery with discomfort per pt - For treatment of pelvic organ prolapse, we discussed options for management including expectant management, conservative management, and surgical management, such as Kegels, a pessary, pelvic floor physical therapy, and specific surgical procedures. - pt desires to avoid surgical intervention - encouraged to consider pessary placement and reviewed instructions for splinting  - referral sent to pelvic floor PT  Orders: -     AMB referral to rehabilitation  Incomplete emptying of bladder Assessment & Plan: - h/o anterior repair at the time of [facility] for AUB-L and urinary incontinence by Dr.  Arloa in 04/09/2016 complicated by urinary retention. Op note noted 1-0 Ethibond sutures used. - concerns of incomplete bladder emptying with catheterized PVR .  - encouraged splinting to ensure bladder emptying due to outlet obstruction from prolapse vs. Pelvic floor dyssynergia - referral to pelvic floor PT - reviewed management options include pessary trial, pelvic floor PT, CIC or indwelling foley. Patient desires to avoid CIC   Dyspareunia, female Assessment & Plan: - pain with deep penetration and postcoital spotting despite lubrication use - The origin of pelvic floor muscle spasm can be multifactorial, including primary, reactive to a different pain source, trauma, or even part of a centralized pain syndrome.Treatment options include pelvic floor physical therapy, local (vaginal) or oral  muscle relaxants, pelvic muscle trigger point injections or centrally acting pain medications.   - referral to pelvic floor PT - encouraged to continue lubrication use - h/o gross hematuria and recurrent UTI with bladder biopsies by Dr. Ike (urology) in 2004/2005, consider bladder pain syndrome   History of pelvic surgery Assessment & Plan: - h/o anterior repair at the time of [facility] for AUB-L and urinary incontinence by Dr. Arloa in 04/09/2016 complicated by urinary retention. Op note noted 1-0 Ethibond sutures used. - concerns of incomplete bladder emptying with catheterized PVR .  - will need urodynamics prior to surgical intervention or if persistent symptoms   Nocturia Assessment & Plan: - avoid fluid intake 3 hours before bedtime - elevated feet during the day or use compression socks to reduce lower extremity swelling - switch diuretic (e.g. furosemide ) dosing to 2pm - continue supplemental O2 at night - discussed association with heart failure and suboptimal glycemic control    Time spent: I spent 94 minutes dedicated to the care of this patient on the date of this encounter  to include pre-visit review of records, face-to-face time with the patient discussing incomplete bladder emptying, stage II pelvic organ prolapse, history of pelvic surgery, dyspareunia, fecal incontinence, vaginal atrophy, mixed urinary incontinence, and post visit documentation and ordering medication/ testing.   Lianne ONEIDA Gillis, MD

## 2024-06-08 NOTE — Assessment & Plan Note (Addendum)
-   POCT UA negative with elevated PVR , repeat prior to treatment - discussed splinting to ensure bladder emptying - most bothered by night time symptoms and urgency, discussed association with heart failure and glycemic control - encouraged fluid management and reduction of caffeine  - repeat PVR at follow-up

## 2024-06-08 NOTE — Assessment & Plan Note (Addendum)
-   minimal bother - h/o anterior repair at the time of [facility] for AUB-L and urinary incontinence by Dr. Arloa in 04/09/2016 complicated by urinary retention. Op note noted 1-0 Ethibond sutures used. - concerns of incomplete bladder emptying with catheterized PVR .  - pessary trial prior to surgery with discomfort per pt - For treatment of pelvic organ prolapse, we discussed options for management including expectant management, conservative management, and surgical management, such as Kegels, a pessary, pelvic floor physical therapy, and specific surgical procedures. - pt desires to avoid surgical intervention - encouraged to consider pessary placement and reviewed instructions for splinting  - referral sent to pelvic floor PT

## 2024-06-08 NOTE — Assessment & Plan Note (Addendum)
-   h/o anterior repair at the time of [facility] for AUB-L and urinary incontinence by Dr. Arloa in 04/09/2016 complicated by urinary retention. Op note noted 1-0 Ethibond sutures used. - concerns of incomplete bladder emptying with catheterized PVR .  - encouraged splinting to ensure bladder emptying due to outlet obstruction from prolapse vs. Pelvic floor dyssynergia - referral to pelvic floor PT - reviewed management options include pessary trial, pelvic floor PT, CIC or indwelling foley. Patient desires to avoid CIC

## 2024-06-08 NOTE — Assessment & Plan Note (Signed)
-   reports chronic diarrhea cycles with constipation managed by GI - uses stool softeners, 1-2 teaspoons of miralax  with senna 1x/week - Treatment options include anti-diarrhea medication (loperamide/ Imodium OTC or prescription lomotil), fiber supplements, physical therapy, and possible sacral neuromodulation or surgery.   - encouraged titration of miralax  to reduce senna use to optimize stool consistency - referral to pelvic floor PT

## 2024-06-08 NOTE — Assessment & Plan Note (Signed)
-   avoid fluid intake 3 hours before bedtime - elevated feet during the day or use compression socks to reduce lower extremity swelling - switch diuretic (e.g. furosemide ) dosing to 2pm - continue supplemental O2 at night - discussed association with heart failure and suboptimal glycemic control

## 2024-06-08 NOTE — Assessment & Plan Note (Signed)
-   pain with deep penetration and postcoital spotting despite lubrication use - The origin of pelvic floor muscle spasm can be multifactorial, including primary, reactive to a different pain source, trauma, or even part of a centralized pain syndrome.Treatment options include pelvic floor physical therapy, local (vaginal) or oral  muscle relaxants, pelvic muscle trigger point injections or centrally acting pain medications.   - referral to pelvic floor PT - encouraged to continue lubrication use - h/o gross hematuria and recurrent UTI with bladder biopsies by Dr. Ike (urology) in 2004/2005, consider bladder pain syndrome

## 2024-06-08 NOTE — Patient Instructions (Addendum)
 You have a stage 2 (out of 4) prolapse.  We discussed the fact that it is not life threatening but there are several treatment options. For treatment of pelvic organ prolapse, we discussed options for management including expectant management, conservative management, and surgical management, such as Kegels, a pessary, pelvic floor physical therapy, and specific surgical procedures.     Please return in 4-6 weeks to reassess bladder emptying.  For vaginal atrophy (thinning of the vaginal tissue that can cause dryness and burning) and UTI prevention we discussed estrogen replacement in the form of vaginal cream.   Start vaginal estrogen therapy nightly for two weeks then 2 times weekly at night. This can be placed with your finger or an applicator inside the vagina and around the urethra.  Please let us  know if the prescription is too expensive and we can look for alternative options.   Is vaginal estrogen therapy safe for me? Vaginal estrogen preparations act on the vaginal skin, and only a very tiny amount is absorbed into the bloodstream (0.01%).  They work in a similar way to hand or face cream.  There is minimal absorption and they are therefore perfectly safe. If you have had breast cancer and have persistent troublesome symptoms which aren't settling with vaginal moisturisers and lubricants, local estrogen treatment may be a possibility, but consultation with your oncologist should take place first.   Constipation: Our goal is to achieve formed bowel movements daily or every-other-day.  You may need to try different combinations of the following options to find what works best for you - everybody's body works differently so feel free to adjust the dosages as needed.  Some options to help maintain bowel health include:  Dietary changes (more leafy greens, vegetables and fruits; less processed foods) Fiber supplementation (Benefiber, FiberCon, Metamucil or Psyllium). Start slow and increase  gradually to full dose. Over-the-counter agents such as: stool softeners (Docusate or Colace) and/or laxatives (Miralax , milk of magnesia)  Power Pudding is a natural mixture that may help your constipation.  To make blend 1 cup applesauce, 1 cup wheat bran, and 3/4 cup prune juice, refrigerate and then take 1 tablespoon daily with a large glass of water as needed.   Women should try to eat at least 21 to 25 grams of fiber a day, while men should aim for 30 to 38 grams a day. You can add fiber to your diet with food or a fiber supplement such as psyllium (metamucil), benefiber, or fibercon.   Here's a look at how much dietary fiber is found in some common foods. When buying packaged foods, check the Nutrition Facts label for fiber content. It can vary among brands.  Fruits Serving size Total fiber (grams)*  Raspberries 1 cup 8.0  Pear 1 medium 5.5  Apple, with skin 1 medium 4.5  Banana 1 medium 3.0  Orange 1 medium 3.0  Strawberries 1 cup 3.0   Vegetables Serving size Total fiber (grams)*  Green peas, boiled 1 cup 9.0  Broccoli, boiled 1 cup chopped 5.0  Turnip greens, boiled 1 cup 5.0  Brussels sprouts, boiled 1 cup 4.0  Potato, with skin, baked 1 medium 4.0  Sweet corn, boiled 1 cup 3.5  Cauliflower, raw 1 cup chopped 2.0  Carrot, raw 1 medium 1.5   Grains Serving size Total fiber (grams)*  Spaghetti, whole-wheat, cooked 1 cup 6.0  Barley, pearled, cooked 1 cup 6.0  Bran flakes 3/4 cup 5.5  Quinoa, cooked 1 cup 5.0  Oat bran  muffin 1 medium 5.0  Oatmeal, instant, cooked 1 cup 5.0  Popcorn, air-popped 3 cups 3.5  Brown rice, cooked 1 cup 3.5  Bread, whole-wheat 1 slice 2.0  Bread, rye 1 slice 2.0   Legumes, nuts and seeds Serving size Total fiber (grams)*  Split peas, boiled 1 cup 16.0  Lentils, boiled 1 cup 15.5  Black beans, boiled 1 cup 15.0  Baked beans, canned 1 cup 10.0  Chia seeds 1 ounce 10.0  Almonds 1 ounce (23 nuts) 3.5  Pistachios 1 ounce (49 nuts) 3.0   Sunflower kernels 1 ounce 3.0  *Rounded to nearest 0.5 gram. Source: [employer] for Standard Reference, Legacy Release    The origin of pelvic floor muscle spasm can be multifactorial, including primary, reactive to a different pain source, trauma, or even part of a centralized pain syndrome.Treatment options include pelvic floor physical therapy, local (vaginal) or oral  muscle relaxants, pelvic muscle trigger point injections or centrally acting pain medications.     Please call [phone] to schedule the earliest appointment for pelvic floor PT.

## 2024-06-09 ENCOUNTER — Other Ambulatory Visit: Payer: Self-pay | Admitting: Physician Assistant

## 2024-06-09 DIAGNOSIS — J4520 Mild intermittent asthma, uncomplicated: Secondary | ICD-10-CM

## 2024-06-10 ENCOUNTER — Encounter: Payer: Self-pay | Admitting: Emergency Medicine

## 2024-06-10 ENCOUNTER — Ambulatory Visit
Admission: EM | Admit: 2024-06-10 | Discharge: 2024-06-10 | Disposition: A | Discharge status: Home / Self Care | Attending: Emergency Medicine | Admitting: Emergency Medicine

## 2024-06-10 DIAGNOSIS — N390 Urinary tract infection, site not specified: Secondary | ICD-10-CM | POA: Insufficient documentation

## 2024-06-10 DIAGNOSIS — N3091 Cystitis, unspecified with hematuria: Secondary | ICD-10-CM | POA: Insufficient documentation

## 2024-06-10 LAB — URINALYSIS, W/ REFLEX TO CULTURE (INFECTION SUSPECTED)
Bilirubin Urine: NEGATIVE
Glucose, UA: 500 mg/dL — AB
Ketones, ur: NEGATIVE mg/dL
Nitrite: NEGATIVE
Specific Gravity, Urine: 1.01 (ref 1.005–1.030)
pH: 7 (ref 5.0–8.0)

## 2024-06-10 MED ORDER — CEFDINIR 300 MG PO CAPS: 300.0000 mg | ORAL_CAPSULE | Freq: Two times a day (BID) | ORAL | 0 refills | Status: DC

## 2024-06-10 MED ORDER — PHENAZOPYRIDINE HCL 200 MG PO TABS: 200.0000 mg | ORAL_TABLET | Freq: Three times a day (TID) | ORAL | 0 refills | Status: AC

## 2024-06-10 NOTE — ED Provider Notes (Signed)
 [facility]    CSN: [id number] Arrival date & time: 06/10/24  1310      History   Chief Complaint Chief Complaint  Patient presents with   Hematuria    HPI Vanessa Evans is a 78 y.o. female.   HPI  78 year old female with past medical history significant for atrial fibrillation with RVR on Eliquis , osteoarthritis, asthma, depression, hypertension, hyperlipidemia, heart failure, GERD, migraine headaches, macular degeneration, uterine prolapse, overactive bladder, and hemorrhagic UTIs presents for evaluation of low back discomfort, dysuria, urgency, frequency, and hematuria that began this morning.  She also endorses some nausea and suprapubic pain.  No fever or vomiting.  Past Medical History:  Diagnosis Date   Arthritis    osteo   Asthma    needs rescue inhaler few times a year   Atrial fib/flutter, transient (HCC)    Claustrophobia 04/21/2020   Depression    Dizziness    light headed spells but it has been a while   Dysrhythmia    tachycardia.(cardizem ). brady during procedures   Fibromyalgia    GERD (gastroesophageal reflux disease)    gastritis   Headache(784.0)    Heart failure (HCC)    Heart murmur 2019   aortic. dr. hester not worried about this   Hyperlipidemia    Hypertension    Hypothyroidism    Macular degeneration    Migraine    Orthopnea    Pneumonia    long time ago (greater than 5 years ago)   PONV (postoperative nausea and vomiting)    very anxious during cataract surgery. brady during colonoscopy   Shortness of breath    any exertion, cannot lie flat    Patient Active Problem List   Diagnosis Date Noted   Vaginal atrophy 06/08/2024   Urinary incontinence, mixed 06/08/2024   Incontinence of feces 06/08/2024   Pelvic organ prolapse quantification stage 2 cystocele 06/08/2024   Dyspareunia, female 06/08/2024   History of pelvic surgery 06/08/2024   Nocturia 06/08/2024   Acute hypoxic respiratory failure (HCC) 09/10/2023    Atrial fibrillation with RVR (HCC) 09/10/2023   Influenza A with pneumonia 09/10/2023   Asthma exacerbation 09/10/2023   (HFpEF) heart failure with preserved ejection fraction (HCC) 09/10/2023   Lymphedema 01/18/2022   Coronary artery disease involving native coronary artery of native heart with angina pectoris 10/28/2020   Peptic ulcer disease 04/21/2020   Claustrophobia 04/21/2020   Urinary tract infection without hematuria 02/04/2020   Pain in both lower extremities 12/18/2019   Inflammatory polyarthritis (HCC) 07/26/2019   Excessive daytime sleepiness 07/26/2019   Encounter for screening mammogram for malignant neoplasm of breast 07/26/2019   Intercostal muscle pain 06/25/2019   Positive ANA (antinuclear antibody) 05/01/2019   Primary osteoarthritis involving multiple joints 04/26/2019   Mild intermittent asthma without complication 08/22/2018   Seasonal allergic rhinitis due to pollen 08/22/2018   Chills with fever 08/11/2018   Sore throat 08/11/2018   Acute upper respiratory infection 08/11/2018   Candidiasis 08/11/2018   Iron deficiency anemia 05/05/2018   Right hip pain 04/27/2018   Accidental fall from furniture 04/27/2018   Vitamin D  deficiency 04/27/2018   Cough 04/18/2018   Dysuria 04/18/2018   Status post reverse total shoulder replacement, right 02/22/2018   Abnormal ECG 02/18/2018   Pain in joint of right shoulder 02/13/2018   Abdominal aortic atherosclerosis 02/13/2018   Episode of recurrent major depressive disorder 02/13/2018   Encounter for general adult medical examination with abnormal findings 02/13/2018   Lipoma  of right shoulder 01/31/2018   Rotator cuff tendinitis, right 01/31/2018   Chronic superficial gastritis without bleeding 12/12/2017   Schatzki's ring 12/12/2017   Acute gastritis 11/08/2017   Nausea 10/18/2017   Other fatigue 10/18/2017   Allergic rhinitis 09/20/2017   Eczema 09/20/2017   Hematuria 09/20/2017   Mixed hyperlipidemia  09/20/2017   Osteoarthritis 09/20/2017   Osteoporosis, post-menopausal 09/20/2017   Palpitations 07/05/2017   Epigastric pain 05/24/2017   Gastroesophageal reflux disease without esophagitis 05/24/2017   Impingement syndrome of left shoulder 10/02/2016   Trochanteric bursitis of left hip 10/02/2016   SOB (shortness of breath) 04/16/2016   Cystocele 04/09/2016   Hyponatremia 04/09/2016   Benign essential HTN 11/18/2015   Paroxysmal supraventricular tachycardia 11/18/2015   Premature ventricular contraction 07/20/2014   History of colonic polyps 05/29/2014   Asthma 02/05/2014   Chest pain 02/05/2014   Increased frequency of urination 02/05/2014   Tachycardia 02/05/2014   Female stress incontinence 12/14/2013   Gross hematuria 12/14/2013   Incomplete emptying of bladder 12/14/2013   Other chronic cystitis without hematuria 12/14/2013    Past Surgical History:  Procedure Laterality Date   BACK SURGERY  2014   removed bone to get to a benign tumor that was pushing on spinal column   BIOPSY THYROID   2018   bladder biopsies  2003   9 biopsies done by dr. cope.  all benign.  inflammatory process going on in bladder   BREAST BIOPSY Right    benign   BREAST SURGERY Right    lumpectomy   CATARACT EXTRACTION W/PHACO Left 12/25/2014   Procedure: CATARACT EXTRACTION PHACO AND INTRAOCULAR LENS PLACEMENT (IOC);  Surgeon: Elsie Carmine, MD;  Location: [facility] ORS;  Service: Ophthalmology;  Laterality: Left;  US  00:32 AP% 20.0 CDE 6.49   COLONOSCOPY W/ BIOPSIES     COLONOSCOPY W/ POLYPECTOMY  2002, 2004, 2005   adenomatous polyps removed   COLONOSCOPY WITH PROPOFOL  N/A 08/23/2017   Procedure: COLONOSCOPY WITH PROPOFOL ;  Surgeon: Viktoria Lamar DASEN, MD;  Location: [facility] ENDOSCOPY;  Service: Endoscopy;  Laterality: N/A;   COLONOSCOPY WITH PROPOFOL  N/A 11/08/2017   Procedure: COLONOSCOPY WITH PROPOFOL ;  Surgeon: Viktoria Lamar DASEN, MD;  Location: [facility] ENDOSCOPY;  Service: Endoscopy;  Laterality:  N/A;   COLONOSCOPY WITH PROPOFOL  N/A 06/30/2021   Procedure: COLONOSCOPY WITH PROPOFOL ;  Surgeon: Maryruth Ole DASEN, MD;  Location: [facility] ENDOSCOPY;  Service: Endoscopy;  Laterality: N/A;   CYSTOCELE REPAIR N/A 04/09/2016   Procedure: ANTERIOR REPAIR (CYSTOCELE);  Surgeon: Lamar SHAUNNA Lesches, MD;  Location: [facility] ORS;  Service: Gynecology;  Laterality: N/A;   DIAGNOSTIC LAPAROSCOPY  2008   removed both ovaries with cysts, tubes and fibroids   DILATION AND CURETTAGE OF UTERUS  1973   ESOPHAGOGASTRODUODENOSCOPY (EGD) WITH PROPOFOL  N/A 08/23/2017   Procedure: ESOPHAGOGASTRODUODENOSCOPY (EGD) WITH PROPOFOL ;  Surgeon: Viktoria Lamar DASEN, MD;  Location: [facility] ENDOSCOPY;  Service: Endoscopy;  Laterality: N/A;   ESOPHAGOGASTRODUODENOSCOPY (EGD) WITH PROPOFOL  N/A 06/30/2021   Procedure: ESOPHAGOGASTRODUODENOSCOPY (EGD) WITH PROPOFOL ;  Surgeon: Maryruth Ole DASEN, MD;  Location: [facility] ENDOSCOPY;  Service: Endoscopy;  Laterality: N/A;   EYE SURGERY Bilateral 2016   HERNIA REPAIR Right 2008   Inguinal hernia Repair, ventral hernia repair   IR KYPHO THORACIC WITH BONE BIOPSY  05/12/2022   IR RADIOLOGIST EVAL & MGMT  05/19/2022   IR RADIOLOGIST EVAL & MGMT  05/26/2022   JOINT REPLACEMENT Right 2008   knee   KNEE ARTHROSCOPY Right    LUMBAR LAMINECTOMY/DECOMPRESSION MICRODISCECTOMY Right 03/21/2013  Procedure: Right Lumbar five-Sacral one Laminectomy for Synovial Cyst;  Surgeon: Darina MALVA Boehringer, MD;  Location: [facility]URO ORS;  Service: Neurosurgery;  Laterality: Right;  right   OOPHORECTOMY     REVERSE SHOULDER ARTHROPLASTY Right 02/22/2018   Procedure: REVERSE SHOULDER ARTHROPLASTY;  Surgeon: Edie Norleen PARAS, MD;  Location: [facility] ORS;  Service: Orthopedics;  Laterality: Right;   TUBAL LIGATION     UNILATERAL SALPINGECTOMY Left 04/09/2016   Procedure: UNILATERAL SALPINGECTOMY;  Surgeon: Lamar SHAUNNA Lesches, MD;  Location: [facility] ORS;  Service: Gynecology;  Laterality: Left;   UPPER GI ENDOSCOPY  2010   with biopsy of  gastric erosion   VAGINAL HYSTERECTOMY N/A 04/09/2016   Procedure: HYSTERECTOMY VAGINAL;  Surgeon: Lamar SHAUNNA Lesches, MD;  Location: [facility] ORS;  Service: Gynecology;  Laterality: N/A;   VAGINAL HYSTERECTOMY  2017   and bladder tack    OB History     Gravida  4   Para  3   Term  3   Preterm      AB  1   Living  3      SAB  1   IAB      Ectopic      Multiple      Live Births  3            Home Medications    Prior to Admission medications   Medication Sig Start Date End Date Taking? Authorizing Provider  cefdinir (OMNICEF) 300 MG capsule Take 1 capsule (300 mg total) by mouth 2 (two) times daily for 7 days. 06/10/24 06/17/24 Yes Bernardino Ditch, NP  phenazopyridine  (PYRIDIUM ) 200 MG tablet Take 1 tablet (200 mg total) by mouth 3 (three) times daily. 06/10/24  Yes Bernardino Ditch, NP  acetaminophen  (TYLENOL ) 500 MG tablet Take 1,000 mg by mouth 2 (two) times daily as needed for moderate pain.    [provider]  acidophilus (RISAQUAD) CAPS capsule Take 1 capsule by mouth daily.    [provider]  albuterol  (VENTOLIN  HFA) 108 (90 Base) MCG/ACT inhaler Inhale 2 puffs into the lungs every 6 (six) hours as needed for wheezing or shortness of breath. 06/09/24   Fernand Elfreda LABOR, MD  apixaban  (ELIQUIS ) 5 MG TABS tablet Take 1 tablet by mouth 2 (two) times daily. 09/28/23 09/27/24  [provider]  aspirin  EC 81 MG tablet Take 81 mg by mouth at bedtime.     [provider]  bisacodyl  (DULCOLAX) 5 MG EC tablet Take 1 tablet (5 mg total) by mouth at bedtime as needed for moderate constipation. 09/17/23   Von Bellis, MD  BLACK ELDERBERRY PO Take 1 Dose by mouth 2 (two) times a day.    [provider]  Calcium  Carbonate-Vitamin D  600-400 MG-UNIT tablet Take 1 tablet by mouth daily.    [provider]  cholecalciferol  (VITAMIN D ) 1000 UNITS tablet Take 1,000 Units by mouth at bedtime.     [provider]  citalopram  (CELEXA ) 20 MG  tablet Take 1 tablet (20 mg total) by mouth daily. 12/14/23   McDonough, Tinnie POUR, PA-C  cyanocobalamin  500 MCG tablet Take 500 mcg by mouth daily.    [provider]  diclofenac  Sodium (VOLTAREN ) 1 % GEL Apply 4 g topically 4 (four) times daily. 04/08/23   McDonough, Tinnie POUR, PA-C  diltiazem  (CARDIZEM  CD) 120 MG 24 hr capsule Take 120 mg by mouth daily.    [provider]  diphenhydrAMINE  (BENADRYL ) 25 MG tablet Take 25-50 mg by  mouth daily as needed for itching.    [provider]  empagliflozin (JARDIANCE) 10 MG TABS tablet Take 10 mg by mouth daily.    [provider]  EPINEPHrine  (EPIPEN  2-PAK) 0.3 mg/0.3 mL IJ SOAJ injection use as directed for severe allergy reaction 03/12/20   Khan, Saadat A, MD  esomeprazole (NEXIUM) 40 MG capsule Take 40 mg by mouth daily.    [provider]  estradiol  (ESTRACE ) 0.01 % CREA vaginal cream Place 0.5g nightly for two weeks then twice a week after 06/08/24   Guadlupe Dull T, MD  famotidine  (PEPCID ) 20 MG tablet Take 20 mg by mouth 2 (two) times daily.    [provider]  fenofibrate  160 MG tablet Take 1 tablet (160 mg total) by mouth daily. for cholesterol 05/10/24   McDonough, Tinnie POUR, PA-C  fluticasone  (FLONASE ) 50 MCG/ACT nasal spray Use 2 sprays in each nostril daily, 07/12/23   McDonough, Lauren K, PA-C  Fluticasone -Umeclidin-Vilant (TRELEGY ELLIPTA) 100-62.5-25 MCG/ACT AEPB Inhale 100 mcg into the lungs daily.    [provider]  guaiFENesin  (MUCINEX ) 600 MG 12 hr tablet Take 600 mg by mouth at bedtime as needed (congestion).    [provider]  Hypromellose (ARTIFICIAL TEARS OP) Place 1-2 drops into both eyes daily as needed (dry eyes).    [provider]  ipratropium-albuterol  (DUONEB) 0.5-2.5 (3) MG/3ML SOLN Take 3 mLs by nebulization every 6 (six) hours as needed. 09/06/23   McDonough, Lauren K, PA-C  ketotifen  (ZADITOR ) 0.025 % ophthalmic solution Place 1 drop into both eyes 2  (two) times daily as needed (allergies).    [provider]  levocetirizine (XYZAL ) 5 MG tablet Take 1 tablet (5 mg total) by mouth every evening. 12/08/23   McDonough, Lauren K, PA-C  levothyroxine  (SYNTHROID , LEVOTHROID) 50 MCG tablet Take 50 mcg by mouth daily before breakfast. Brand Name only    [provider]  lidocaine  (XYLOCAINE ) 5 % ointment Apply 1 Application topically as needed. 10/07/23   Tobie Franky SQUIBB, DPM  losartan  (COZAAR ) 25 MG tablet Take 25 mg by mouth daily. 07/22/21   [provider]  Magnesium  250 MG TABS Take 1 tablet by mouth daily.    [provider]  Melatonin 5 MG TABS Take 1 tablet by mouth at bedtime. For sleep    [provider]  methocarbamol  (ROBAXIN ) 500 MG tablet TAKE ONE-HALF TO 1 TABLET TWICE DAILY AS NEEDED. 07/19/23   [provider]  metoprolol  succinate (TOPROL -XL) 25 MG 24 hr tablet Take 1 tablet (25 mg total) by mouth 2 (two) times daily. 09/17/23 09/16/24  Von Bellis, MD  Multiple Vitamins-Minerals (EYE VITAMINS PO) Take by mouth.    [provider]  Multiple Vitamins-Minerals (MULTIVITAMIN WITH MINERALS) tablet Take 1 tablet by mouth daily.    [provider]  OXYGEN  Inhale into the lungs. 2 litre at night    [provider]  polyethylene glycol powder (GLYCOLAX /MIRALAX ) 17 GM/SCOOP powder Take 17 g by mouth daily. 09/18/23   Von Bellis, MD  potassium chloride  SA (KLOR-CON  M) 20 MEQ tablet Take 20 mEq by mouth daily as needed. 06/29/23 06/28/24  [provider]  raloxifene  (EVISTA ) 60 MG tablet Take 60 mg by mouth daily.    [provider]  rosuvastatin  (CRESTOR ) 5 MG tablet Take 1 tablet (5 mg total) by mouth daily. 02/24/24   McDonough, Lauren K, PA-C  sodium chloride  (OCEAN) 0.65 % SOLN nasal spray Place 1 spray into both nostrils as  needed for congestion.    [provider]  spironolactone (ALDACTONE) 25 MG tablet Take 25 mg by mouth once.     [provider]  Vitamin A 7.5 MG (25000 UT) CAPS Take 1 tablet by mouth 2 (two) times daily.    [provider]    Family History Family History  Problem Relation Age of Onset   Pulmonary fibrosis Mother    Melanoma Father    COPD Sister    Cardiomyopathy Sister        and arrhythmia   Atrial fibrillation Sister    COPD Brother    Pulmonary fibrosis Brother    Cancer Brother    Breast cancer Cousin        paternal 1st   Bladder Cancer Neg Hx    Uterine cancer Neg Hx    Renal cancer Neg Hx     Social History Social History   Tobacco Use   Smoking status: Former    Current packs/day: 0.00    Types: Cigarettes    Quit date: 1967    Years since quitting: 58.8   Smokeless tobacco: Never  Vaping Use   Vaping status: Never Used  Substance Use Topics   Alcohol  use: Yes    Alcohol /week: 1.0 standard drink of alcohol     Types: 1 Glasses of wine per week    Comment: occasionally   Drug use: No     Allergies   Codeine, Levofloxacin, Oxycodone , Ultram [tramadol], Nitrofurantoin, Nsaids, Sulfacetamide, Adhesive [tape], Azithromycin , Ciprofloxacin, Ketorolac, Sulfa antibiotics, Tolmetin, and Zofran  [ondansetron  hcl]   Review of Systems Review of Systems  Constitutional:  Negative for fever.  Gastrointestinal:  Positive for abdominal pain and nausea. Negative for vomiting.       Suprapubic pain.  Genitourinary:  Positive for dysuria, frequency, hematuria and urgency. Negative for vaginal discharge and vaginal pain.  Musculoskeletal:  Positive for back pain.     Physical Exam Triage Vital Signs ED Triage Vitals  Encounter Vitals Group     BP      Girls Systolic BP Percentile      Girls Diastolic BP Percentile      Boys Systolic BP Percentile      Boys Diastolic BP Percentile      Pulse      Resp      Temp      Temp src      SpO2      Weight      Height      Head Circumference      Peak Flow      Pain Score      Pain Loc      Pain  Education      Exclude from Growth Chart    No data found.  Updated Vital Signs BP 123/72 (BP Location: Right Arm)   Pulse (!) 102   Temp 97.8 F (36.6 C) (Oral)   Resp 18   Ht 4' 11 (1.499 m)   Wt 138 lb 14.2 oz (63 kg)   SpO2 96%   BMI 28.05 kg/m   Visual Acuity Right Eye Distance:   Left Eye Distance:   Bilateral Distance:    Right Eye Near:   Left Eye Near:    Bilateral Near:     Physical Exam Vitals and nursing note reviewed.  Constitutional:      Appearance: Normal appearance. She is not ill-appearing.  Cardiovascular:     Rate and Rhythm: Normal rate and regular  rhythm.     Pulses: Normal pulses.     Heart sounds: Normal heart sounds. No murmur heard.    No friction rub. No gallop.  Pulmonary:     Effort: Pulmonary effort is normal.     Breath sounds: Normal breath sounds. No wheezing, rhonchi or rales.  Abdominal:     Tenderness: There is no right CVA tenderness or left CVA tenderness.  Skin:    General: Skin is warm and dry.     Capillary Refill: Capillary refill takes less than 2 seconds.     Findings: No rash.  Neurological:     General: No focal deficit present.     Mental Status: She is alert and oriented to person, place, and time.      UC Treatments / Results  Labs (all labs ordered are listed, but only abnormal results are displayed) Labs Reviewed  URINALYSIS, W/ REFLEX TO CULTURE (INFECTION SUSPECTED) - Abnormal; Notable for the following components:      Result Value   APPearance HAZY (*)    Glucose, UA 500 (*)    Hgb urine dipstick LARGE (*)    Protein, ur TRACE (*)    Leukocytes,Ua SMALL (*)    Bacteria, UA MANY (*)    All other components within normal limits  URINE CULTURE    EKG   Radiology No results found.  Procedures Procedures (including critical care time)  Medications Ordered in UC Medications - No data to display  Initial Impression / Assessment and Plan / UC Course  I have reviewed the triage vital signs  and the nursing notes.  Pertinent labs & imaging results that were available during my care of the patient were reviewed by me and considered in my medical decision making (see chart for details).   Patient is a pleasant, nontoxic-appearing 78 year old female presenting for evaluation of UTI symptoms as outlined HPI above.  The patient is on Eliquis  but she denies any recent trauma.  She was evaluated by urogynecology 2 days ago due to having a bladder prolapse.  At that visit she had both a speculum and bimanual pelvic exam performed.  She also had a postvoid residual performed via urinary catheter with a PVR of 250 mL a point-of-care urinalysis was performed that was indicated as being negative.  The patient is status post hysterectomy and bilateral overall salpingectomy.  She also has a history of hemorrhagic UTIs and overactive bladder.  She reports that she does not respond well to the overactive bladder medications.  She was seen by urology in 2004 or 2005.  Multiple biopsies were performed which showed an overactive inflammatory response but she does not remember being told she had interstitial cystitis.  Given patient's history of recurrent UTIs, the fact that she is on Jardiance which puts her at increased risk for both yeast infections and UTIs, and her recent pelvic exam and urinary catheterization there is a strong suspicion that this is a hemorrhagic cystitis.  I will order a urinalysis to assess for the presence of infection.  Urinalysis has a hazy appearance with 500 glucose, large hemoglobin, trace protein, small leukocyte esterase.  Negative for nitrites.  Reflex microscopy shows 11-20 WBCs and many bacteria.  Urine will reflex to culture.  The patient has multiple antibiotic allergies to include nitrofurantoin, sulfa antibiotics, azithromycin , and fluoroquinolones.  I will therefore discharge her home on cefdinir 300 mg twice daily for 7 days along with Pyridium  and 10 mg every 8 hours as  needed  for urinary discomfort.  Return to ER precautions reviewed.   Final Clinical Impressions(s) / UC Diagnoses   Final diagnoses:  Lower urinary tract infectious disease  Hemorrhagic cystitis     Discharge Instructions      Take the cefdinir twice daily for 7 days with food for treatment of urinary tract infection.  Use the Pyridium  every 8 hours as needed for urinary discomfort.  This will turn your urine a bright red-orange.  Increase your oral fluid intake so that you increase your urine production and or flushing your urinary system.  Take an over-the-counter probiotic, such as Culturelle-Align-Activia, 1 hour after each dose of antibiotic to prevent diarrhea or yeast infections from forming.  We will culture urine and change the antibiotics if necessary.  Return for reevaluation, or see your primary care provider, for any new or worsening symptoms.      ED Prescriptions     Medication Sig Dispense Auth. Provider   cefdinir (OMNICEF) 300 MG capsule Take 1 capsule (300 mg total) by mouth 2 (two) times daily for 7 days. 14 capsule Bernardino Ditch, NP   phenazopyridine  (PYRIDIUM ) 200 MG tablet Take 1 tablet (200 mg total) by mouth 3 (three) times daily. 6 tablet Bernardino Ditch, NP      PDMP not reviewed this encounter.   Bernardino Ditch, NP 06/10/24 1425

## 2024-06-10 NOTE — Discharge Instructions (Addendum)
 Take the cefdinir twice daily for 7 days with food for treatment of urinary tract infection.  Use the Pyridium every 8 hours as needed for urinary discomfort.  This will turn your urine a bright red-orange.  Increase your oral fluid intake so that you increase your urine production and or flushing your urinary system.  Take an over-the-counter probiotic, such as Culturelle-Align-Activia, 1 hour after each dose of antibiotic to prevent diarrhea or yeast infections from forming.  We will culture urine and change the antibiotics if necessary.  Return for reevaluation, or see your primary care provider, for any new or worsening symptoms.

## 2024-06-10 NOTE — ED Triage Notes (Addendum)
 Pt c/o hematuria, urinary urgency. Started this morning. She states she has some discomfort in her back. Denies fever but state she feels bad. Pt states she saw Urogyno in [employer] [place] for vaginal prolapse last week. Pt is taking eliquis .

## 2024-06-11 LAB — URINE CULTURE

## 2024-06-12 ENCOUNTER — Telehealth: Payer: Self-pay

## 2024-06-12 LAB — POCT URINALYSIS DIP (CLINITEK)
Bilirubin, UA: NEGATIVE
Blood, UA: NEGATIVE
Glucose, UA: 500 mg/dL — AB
Ketones, POC UA: NEGATIVE mg/dL
Leukocytes, UA: NEGATIVE
Nitrite, UA: NEGATIVE
POC PROTEIN,UA: NEGATIVE
Spec Grav, UA: 1.015 (ref 1.010–1.025)
Urobilinogen, UA: 0.2 U/dL
pH, UA: 7 (ref 5.0–8.0)

## 2024-06-12 MED ORDER — CEPHALEXIN 500 MG PO CAPS: 500.0000 mg | ORAL_CAPSULE | Freq: Four times a day (QID) | ORAL | 0 refills | Status: AC

## 2024-06-12 NOTE — Telephone Encounter (Signed)
 Please offer nurse visit for repeat urine testing with pathnostics.  Please review and document whether or not she has fever > 100.4, nausea/vomiting, one sided back pain which will warrant immediate evaluation in the ED.  Antibiotics changed to Keflex  4 times a day for 7 days Please advice patient to callback if yshe continues to experience persistent or worsening urinary symptoms such as fever > 100.4, nausea/vomiting, one sided back pain or blood in her urine.  Continue pyridium  up to 3 times a day as needed for symptoms.

## 2024-06-12 NOTE — Telephone Encounter (Signed)
 I'm sending pathnostic kit to her home due to her not being able to come to the office.

## 2024-06-12 NOTE — Telephone Encounter (Signed)
 Patient stated she went to the ER Saturday due to UTI symptoms and she was given a antibiotic Cedinir. She felt better Sunday, today her symptoms have returned with pain and blood clots in urine. Denies fever. Please advise.

## 2024-06-13 ENCOUNTER — Ambulatory Visit (HOSPITAL_COMMUNITY): Payer: Self-pay

## 2024-06-16 NOTE — Telephone Encounter (Signed)
 Patient received her negative Pathnostic results. Patient reports she is still having urgency. She has been advise if she has worsening symptoms please head to the ED for further assessment.

## 2024-06-19 ENCOUNTER — Ambulatory Visit (INDEPENDENT_AMBULATORY_CARE_PROVIDER_SITE_OTHER): Admitting: Physician Assistant

## 2024-06-19 ENCOUNTER — Encounter: Payer: Self-pay | Admitting: Physician Assistant

## 2024-06-19 VITALS — BP 120/70 | HR 96 | Temp 97.8°F | Resp 16 | Ht 59.0 in | Wt 140.2 lb

## 2024-06-19 DIAGNOSIS — I10 Essential (primary) hypertension: Secondary | ICD-10-CM | POA: Diagnosis not present

## 2024-06-19 DIAGNOSIS — N390 Urinary tract infection, site not specified: Secondary | ICD-10-CM

## 2024-06-19 DIAGNOSIS — N8110 Cystocele, unspecified: Secondary | ICD-10-CM

## 2024-06-19 DIAGNOSIS — R252 Cramp and spasm: Secondary | ICD-10-CM

## 2024-06-19 DIAGNOSIS — J301 Allergic rhinitis due to pollen: Secondary | ICD-10-CM | POA: Diagnosis not present

## 2024-06-19 DIAGNOSIS — R300 Dysuria: Secondary | ICD-10-CM | POA: Diagnosis not present

## 2024-06-19 DIAGNOSIS — F331 Major depressive disorder, recurrent, moderate: Secondary | ICD-10-CM

## 2024-06-19 DIAGNOSIS — J4520 Mild intermittent asthma, uncomplicated: Secondary | ICD-10-CM

## 2024-06-19 DIAGNOSIS — R319 Hematuria, unspecified: Secondary | ICD-10-CM

## 2024-06-19 LAB — POCT URINALYSIS DIPSTICK
Bilirubin, UA: NEGATIVE
Glucose, UA: NEGATIVE
Ketones, UA: NEGATIVE
Nitrite, UA: NEGATIVE
Protein, UA: NEGATIVE
Spec Grav, UA: 1.01
Urobilinogen, UA: 0.2 U/dL
pH, UA: 6

## 2024-06-19 MED ORDER — CITALOPRAM HYDROBROMIDE 20 MG PO TABS: 20.0000 mg | ORAL_TABLET | Freq: Every day | ORAL | 1 refills | Status: AC

## 2024-06-19 MED ORDER — METHOCARBAMOL 500 MG PO TABS: 500.0000 mg | ORAL_TABLET | Freq: Two times a day (BID) | ORAL | 1 refills | Status: AC | PRN

## 2024-06-19 MED ORDER — LEVOCETIRIZINE DIHYDROCHLORIDE 5 MG PO TABS: 5.0000 mg | ORAL_TABLET | Freq: Every evening | ORAL | 1 refills | Status: AC

## 2024-06-19 MED ORDER — ALBUTEROL SULFATE HFA 108 (90 BASE) MCG/ACT IN AERS: 2.0000 | INHALATION_SPRAY | Freq: Four times a day (QID) | RESPIRATORY_TRACT | 1 refills | Status: AC | PRN

## 2024-06-19 NOTE — Progress Notes (Signed)
 [facility] [address] [place], [place] [address]  Internal MEDICINE  Office Visit Note  Patient Name: Vanessa Evans  [id number]  [id number]  Date of Service: 06/19/2024  Chief Complaint  Patient presents with   Follow-up   Depression   Urinary Tract Infection    HPI Pt is here for routine follow up -BP stable -saw urogyn on 10/30, but then developed UTI symptoms after and went to UC. Was on Abx, cefdinir, but when symptoms came back she spoke with urogyn who switched her to keflex  QID for 7 days, has a few days left -symptoms improved some this morning -Goes back to urogyn next week for bladder scan and then sees provider in Dec -probiotics -having more muscle cramps at night, theraworks spray previously worked, but recently has been worse and wonders about dehydration. Already takes magnesium  and has muscle relaxer prn for her back that didn't help -seeing cardiology this month, HF specialist in about 3 months -followed by pulmonology  Current Medication: Outpatient Encounter Medications as of 06/19/2024  Medication Sig Note   acetaminophen  (TYLENOL ) 500 MG tablet Take 1,000 mg by mouth 2 (two) times daily as needed for moderate pain.    acidophilus (RISAQUAD) CAPS capsule Take 1 capsule by mouth daily.    apixaban  (ELIQUIS ) 5 MG TABS tablet Take 1 tablet by mouth 2 (two) times daily.    aspirin  EC 81 MG tablet Take 81 mg by mouth at bedtime.     bisacodyl  (DULCOLAX) 5 MG EC tablet Take 1 tablet (5 mg total) by mouth at bedtime as needed for moderate constipation.    BLACK ELDERBERRY PO Take 1 Dose by mouth 2 (two) times a day.    Calcium  Carbonate-Vitamin D  600-400 MG-UNIT tablet Take 1 tablet by mouth daily.    cephALEXin  (KEFLEX ) 500 MG capsule Take 1 capsule (500 mg total) by mouth 4 (four) times daily for 7 days.    cholecalciferol  (VITAMIN D ) 1000 UNITS tablet Take 1,000 Units by mouth at bedtime.     cyanocobalamin  500 MCG tablet Take 500 mcg by  mouth daily.    diclofenac  Sodium (VOLTAREN ) 1 % GEL Apply 4 g topically 4 (four) times daily.    diltiazem  (CARDIZEM  CD) 120 MG 24 hr capsule Take 120 mg by mouth daily.    diphenhydrAMINE  (BENADRYL ) 25 MG tablet Take 25-50 mg by mouth daily as needed for itching.    empagliflozin (JARDIANCE) 10 MG TABS tablet Take 10 mg by mouth daily.    EPINEPHrine  (EPIPEN  2-PAK) 0.3 mg/0.3 mL IJ SOAJ injection use as directed for severe allergy reaction    esomeprazole (NEXIUM) 40 MG capsule Take 40 mg by mouth daily.    estradiol  (ESTRACE ) 0.01 % CREA vaginal cream Place 0.5g nightly for two weeks then twice a week after    famotidine  (PEPCID ) 20 MG tablet Take 20 mg by mouth 2 (two) times daily.    fenofibrate  160 MG tablet Take 1 tablet (160 mg total) by mouth daily. for cholesterol    fluticasone  (FLONASE ) 50 MCG/ACT nasal spray Use 2 sprays in each nostril daily,    Fluticasone -Umeclidin-Vilant (TRELEGY ELLIPTA) 100-62.5-25 MCG/ACT AEPB Inhale 100 mcg into the lungs daily.    guaiFENesin  (MUCINEX ) 600 MG 12 hr tablet Take 600 mg by mouth at bedtime as needed (congestion).    Hypromellose (ARTIFICIAL TEARS OP) Place 1-2 drops into both eyes daily as needed (dry eyes).    ipratropium-albuterol  (DUONEB) 0.5-2.5 (3) MG/3ML SOLN Take 3 mLs by  nebulization every 6 (six) hours as needed.    ketotifen  (ZADITOR ) 0.025 % ophthalmic solution Place 1 drop into both eyes 2 (two) times daily as needed (allergies).    levothyroxine  (SYNTHROID , LEVOTHROID) 50 MCG tablet Take 50 mcg by mouth daily before breakfast. Brand Name only    lidocaine  (XYLOCAINE ) 5 % ointment Apply 1 Application topically as needed.    losartan  (COZAAR ) 25 MG tablet Take 25 mg by mouth daily.    Magnesium  250 MG TABS Take 1 tablet by mouth daily.    Melatonin 5 MG TABS Take 1 tablet by mouth at bedtime. For sleep    metoprolol  succinate (TOPROL -XL) 25 MG 24 hr tablet Take 1 tablet (25 mg total) by mouth 2 (two) times daily. 10/01/2023: Patient  states taking 1/2 tablet in the morning 1/2 tablet in the evening.    Multiple Vitamins-Minerals (EYE VITAMINS PO) Take by mouth.    Multiple Vitamins-Minerals (MULTIVITAMIN WITH MINERALS) tablet Take 1 tablet by mouth daily.    OXYGEN  Inhale into the lungs. 2 litre at night    phenazopyridine  (PYRIDIUM ) 200 MG tablet Take 1 tablet (200 mg total) by mouth 3 (three) times daily.    polyethylene glycol powder (GLYCOLAX /MIRALAX ) 17 GM/SCOOP powder Take 17 g by mouth daily.    potassium chloride  SA (KLOR-CON  M) 20 MEQ tablet Take 20 mEq by mouth daily as needed.    raloxifene  (EVISTA ) 60 MG tablet Take 60 mg by mouth daily.    rosuvastatin  (CRESTOR ) 5 MG tablet Take 1 tablet (5 mg total) by mouth daily.    sodium chloride  (OCEAN) 0.65 % SOLN nasal spray Place 1 spray into both nostrils as needed for congestion.    spironolactone (ALDACTONE) 25 MG tablet Take 25 mg by mouth once.    Vitamin A 7.5 MG (25000 UT) CAPS Take 1 tablet by mouth 2 (two) times daily.    [DISCONTINUED] albuterol  (VENTOLIN  HFA) 108 (90 Base) MCG/ACT inhaler Inhale 2 puffs into the lungs every 6 (six) hours as needed for wheezing or shortness of breath.    [DISCONTINUED] citalopram  (CELEXA ) 20 MG tablet Take 1 tablet (20 mg total) by mouth daily.    [DISCONTINUED] levocetirizine (XYZAL ) 5 MG tablet Take 1 tablet (5 mg total) by mouth every evening.    [DISCONTINUED] methocarbamol  (ROBAXIN ) 500 MG tablet TAKE ONE-HALF TO 1 TABLET TWICE DAILY AS NEEDED.    albuterol  (VENTOLIN  HFA) 108 (90 Base) MCG/ACT inhaler Inhale 2 puffs into the lungs every 6 (six) hours as needed for wheezing or shortness of breath.    citalopram  (CELEXA ) 20 MG tablet Take 1 tablet (20 mg total) by mouth daily.    levocetirizine (XYZAL ) 5 MG tablet Take 1 tablet (5 mg total) by mouth every evening.    methocarbamol  (ROBAXIN ) 500 MG tablet Take 1 tablet (500 mg total) by mouth 2 (two) times daily as needed for muscle spasms. TAKE ONE-HALF TO 1 TABLET TWICE  DAILY AS NEEDED.    No facility-administered encounter medications on file as of 06/19/2024.    Surgical History: Past Surgical History:  Procedure Laterality Date   BACK SURGERY  2014   removed bone to get to a benign tumor that was pushing on spinal column   BIOPSY THYROID   2018   bladder biopsies  2003   9 biopsies done by dr. cope.  all benign.  inflammatory process going on in bladder   BREAST BIOPSY Right    benign   BREAST SURGERY Right    lumpectomy  CATARACT EXTRACTION W/PHACO Left 12/25/2014   Procedure: CATARACT EXTRACTION PHACO AND INTRAOCULAR LENS PLACEMENT (IOC);  Surgeon: Elsie Carmine, MD;  Location: [facility] ORS;  Service: Ophthalmology;  Laterality: Left;  US  00:32 AP% 20.0 CDE 6.49   COLONOSCOPY W/ BIOPSIES     COLONOSCOPY W/ POLYPECTOMY  2002, 2004, 2005   adenomatous polyps removed   COLONOSCOPY WITH PROPOFOL  N/A 08/23/2017   Procedure: COLONOSCOPY WITH PROPOFOL ;  Surgeon: Viktoria Lamar DASEN, MD;  Location: [facility] ENDOSCOPY;  Service: Endoscopy;  Laterality: N/A;   COLONOSCOPY WITH PROPOFOL  N/A 11/08/2017   Procedure: COLONOSCOPY WITH PROPOFOL ;  Surgeon: Viktoria Lamar DASEN, MD;  Location: [facility] ENDOSCOPY;  Service: Endoscopy;  Laterality: N/A;   COLONOSCOPY WITH PROPOFOL  N/A 06/30/2021   Procedure: COLONOSCOPY WITH PROPOFOL ;  Surgeon: Maryruth Ole DASEN, MD;  Location: [facility] ENDOSCOPY;  Service: Endoscopy;  Laterality: N/A;   CYSTOCELE REPAIR N/A 04/09/2016   Procedure: ANTERIOR REPAIR (CYSTOCELE);  Surgeon: Lamar SHAUNNA Lesches, MD;  Location: [facility] ORS;  Service: Gynecology;  Laterality: N/A;   DIAGNOSTIC LAPAROSCOPY  2008   removed both ovaries with cysts, tubes and fibroids   DILATION AND CURETTAGE OF UTERUS  1973   ESOPHAGOGASTRODUODENOSCOPY (EGD) WITH PROPOFOL  N/A 08/23/2017   Procedure: ESOPHAGOGASTRODUODENOSCOPY (EGD) WITH PROPOFOL ;  Surgeon: Viktoria Lamar DASEN, MD;  Location: [facility] ENDOSCOPY;  Service: Endoscopy;  Laterality: N/A;   ESOPHAGOGASTRODUODENOSCOPY (EGD)  WITH PROPOFOL  N/A 06/30/2021   Procedure: ESOPHAGOGASTRODUODENOSCOPY (EGD) WITH PROPOFOL ;  Surgeon: Maryruth Ole DASEN, MD;  Location: [facility] ENDOSCOPY;  Service: Endoscopy;  Laterality: N/A;   EYE SURGERY Bilateral 2016   HERNIA REPAIR Right 2008   Inguinal hernia Repair, ventral hernia repair   IR KYPHO THORACIC WITH BONE BIOPSY  05/12/2022   IR RADIOLOGIST EVAL & MGMT  05/19/2022   IR RADIOLOGIST EVAL & MGMT  05/26/2022   JOINT REPLACEMENT Right 2008   knee   KNEE ARTHROSCOPY Right    LUMBAR LAMINECTOMY/DECOMPRESSION MICRODISCECTOMY Right 03/21/2013   Procedure: Right Lumbar five-Sacral one Laminectomy for Synovial Cyst;  Surgeon: Darina MALVA Boehringer, MD;  Location: [facility];  Service: Neurosurgery;  Laterality: Right;  right   OOPHORECTOMY     REVERSE SHOULDER ARTHROPLASTY Right 02/22/2018   Procedure: REVERSE SHOULDER ARTHROPLASTY;  Surgeon: Edie Norleen PARAS, MD;  Location: [facility] ORS;  Service: Orthopedics;  Laterality: Right;   TUBAL LIGATION     UNILATERAL SALPINGECTOMY Left 04/09/2016   Procedure: UNILATERAL SALPINGECTOMY;  Surgeon: Lamar SHAUNNA Lesches, MD;  Location: [facility] ORS;  Service: Gynecology;  Laterality: Left;   UPPER GI ENDOSCOPY  2010   with biopsy of gastric erosion   VAGINAL HYSTERECTOMY N/A 04/09/2016   Procedure: HYSTERECTOMY VAGINAL;  Surgeon: Lamar SHAUNNA Lesches, MD;  Location: [facility] ORS;  Service: Gynecology;  Laterality: N/A;   VAGINAL HYSTERECTOMY  2017   and bladder tack    Medical History: Past Medical History:  Diagnosis Date   Arthritis    osteo   Asthma    needs rescue inhaler few times a year   Atrial fib/flutter, transient (HCC)    Claustrophobia 04/21/2020   Depression    Dizziness    light headed spells but it has been a while   Dysrhythmia    tachycardia.(cardizem ). brady during procedures   Fibromyalgia    GERD (gastroesophageal reflux disease)    gastritis   Headache(784.0)    Heart failure (HCC)    Heart murmur 2019   aortic. dr. hester not  worried about this   Hyperlipidemia    Hypertension  Hypothyroidism    Macular degeneration    Migraine    Orthopnea    Pneumonia    long time ago (greater than 5 years ago)   PONV (postoperative nausea and vomiting)    very anxious during cataract surgery. brady during colonoscopy   Shortness of breath    any exertion, cannot lie flat    Family History: Family History  Problem Relation Age of Onset   Pulmonary fibrosis Mother    Melanoma Father    COPD Sister    Cardiomyopathy Sister        and arrhythmia   Atrial fibrillation Sister    COPD Brother    Pulmonary fibrosis Brother    Cancer Brother    Breast cancer Cousin        paternal 1st   Bladder Cancer Neg Hx    Uterine cancer Neg Hx    Renal cancer Neg Hx     Social History   Socioeconomic History   Marital status: Married    Spouse name: Not on file   Number of children: Not on file   Years of education: Not on file   Highest education level: Not on file  Occupational History   Not on file  Tobacco Use   Smoking status: Former    Current packs/day: 0.00    Types: Cigarettes    Quit date: 1967    Years since quitting: 58.8   Smokeless tobacco: Never  Vaping Use   Vaping status: Never Used  Substance and Sexual Activity   Alcohol  use: Yes    Alcohol /week: 1.0 standard drink of alcohol     Types: 1 Glasses of wine per week    Comment: occasionally   Drug use: No   Sexual activity: Not Currently    Partners: Male    Birth control/protection: None  Other Topics Concern   Not on file  Social History Narrative   Not on file   Social Drivers of Health   Financial Resource Strain: Not on file  Food Insecurity: No Food Insecurity (10/01/2023)   Hunger Vital Sign    Worried About Running Out of Food in the Last Year: Never true    Ran Out of Food in the Last Year: Never true  Transportation Needs: No Transportation Needs (10/01/2023)   PRAPARE - Administrator, Civil Service (Medical):  No    Lack of Transportation (Non-Medical): No  Physical Activity: Not on file  Stress: Not on file  Social Connections: Unknown (09/12/2023)   Social Connection and Isolation Panel    Frequency of Communication with Friends and Family: More than three times a week    Frequency of Social Gatherings with Friends and Family: More than three times a week    Attends Religious Services: More than 4 times per year    Active Member of [employer] or Organizations: Patient unable to answer    Attends Banker Meetings: Patient unable to answer    Marital Status: Married  Catering Manager Violence: Not At Risk (10/01/2023)   Humiliation, Afraid, Rape, and Kick questionnaire    Fear of Current or Ex-Partner: No    Emotionally Abused: No    Physically Abused: No    Sexually Abused: No      Review of Systems  Constitutional:  Negative for chills and unexpected weight change.  HENT:  Positive for postnasal drip. Negative for rhinorrhea, sneezing and sore throat.   Eyes:  Negative for redness.  Respiratory:  Negative for chest tightness and shortness of breath.   Cardiovascular:  Negative for chest pain and palpitations.  Gastrointestinal:  Negative for abdominal pain, constipation, diarrhea, nausea and vomiting.  Genitourinary:  Positive for dysuria and frequency.       Vaginal pressure/heaviness  Musculoskeletal:  Positive for arthralgias and myalgias. Negative for joint swelling and neck pain.  Skin:  Negative for wound.  Neurological: Negative.  Negative for tremors and numbness.  Hematological:  Negative for adenopathy. Does not bruise/bleed easily.  Psychiatric/Behavioral:  Positive for dysphoric mood. Negative for self-injury, sleep disturbance and suicidal ideas. Behavioral problem: Depression.The patient is nervous/anxious.     Vital Signs: BP 120/70   Pulse 96   Temp 97.8 F (36.6 C)   Resp 16   Ht 4' 11 (1.499 m)   Wt 140 lb 3.2 oz (63.6 kg)   SpO2 95%   BMI 28.32  kg/m    Physical Exam Vitals and nursing note reviewed.  Constitutional:      General: She is not in acute distress.    Appearance: Normal appearance. She is well-developed. She is not diaphoretic.  HENT:     Head: Normocephalic and atraumatic.  Eyes:     Extraocular Movements: Extraocular movements intact.  Neck:     Thyroid : No thyromegaly.     Vascular: No JVD.     Trachea: No tracheal deviation.  Cardiovascular:     Rate and Rhythm: Normal rate and regular rhythm.     Heart sounds: Normal heart sounds. No murmur heard.    No friction rub. No gallop.  Pulmonary:     Effort: Pulmonary effort is normal. No respiratory distress.     Breath sounds: No wheezing.  Skin:    General: Skin is warm and dry.  Neurological:     Mental Status: She is alert and oriented to person, place, and time.  Psychiatric:        Behavior: Behavior normal.        Thought Content: Thought content normal.        Judgment: Judgment normal.        Assessment/Plan: 1. Benign essential HTN (Primary) Well controlled, continue current medication  2. Cramp in muscle Will checks labs, may be due to dehydration - Basic Metabolic Panel (BMET) - CBC w/Diff/Platelet  3. Moderate episode of recurrent major depressive disorder (HCC) - citalopram  (CELEXA ) 20 MG tablet; Take 1 tablet (20 mg total) by mouth daily.  Dispense: 90 tablet; Refill: 1  4. Allergic rhinitis due to pollen, unspecified seasonality - levocetirizine (XYZAL ) 5 MG tablet; Take 1 tablet (5 mg total) by mouth every evening.  Dispense: 90 tablet; Refill: 1  5. Chronic asthma, mild intermittent, uncomplicated - albuterol  (VENTOLIN  HFA) 108 (90 Base) MCG/ACT inhaler; Inhale 2 puffs into the lungs every 6 (six) hours as needed for wheezing or shortness of breath.  Dispense: 8.5 g; Refill: 1  6. Female bladder prolapse Followed by urogyn now  7. Urinary tract infection with hematuria, site unspecified Will check repeat culture,  already on Keflex  and will follow up with urogyn as well - CULTURE, URINE COMPREHENSIVE - CBC w/Diff/Platelet  8. Dysuria - POCT Urinalysis Dipstick   General Counseling: Loredana verbalizes understanding of the findings of todays visit and agrees with plan of treatment. I have discussed any further diagnostic evaluation that may be needed or ordered today. We also reviewed her medications today. she has been encouraged to call the office with any questions or concerns that should  arise related to todays visit.    Orders Placed This Encounter  Procedures   CULTURE, URINE COMPREHENSIVE   Basic Metabolic Panel (BMET)   CBC w/Diff/Platelet   POCT Urinalysis Dipstick    Meds ordered this encounter  Medications   citalopram  (CELEXA ) 20 MG tablet    Sig: Take 1 tablet (20 mg total) by mouth daily.    Dispense:  90 tablet    Refill:  1   levocetirizine (XYZAL ) 5 MG tablet    Sig: Take 1 tablet (5 mg total) by mouth every evening.    Dispense:  90 tablet    Refill:  1   albuterol  (VENTOLIN  HFA) 108 (90 Base) MCG/ACT inhaler    Sig: Inhale 2 puffs into the lungs every 6 (six) hours as needed for wheezing or shortness of breath.    Dispense:  8.5 g    Refill:  1   methocarbamol  (ROBAXIN ) 500 MG tablet    Sig: Take 1 tablet (500 mg total) by mouth 2 (two) times daily as needed for muscle spasms. TAKE ONE-HALF TO 1 TABLET TWICE DAILY AS NEEDED.    Dispense:  30 tablet    Refill:  1    This patient was seen by Tinnie Pro, PA-C in collaboration with Dr. Sigrid Bathe as a part of collaborative care agreement.   Total time spent:30 Minutes Time spent includes review of chart, medications, test results, and follow up plan with the patient.      Dr Fozia M Khan Internal medicine

## 2024-06-20 LAB — CBC WITH DIFFERENTIAL/PLATELET
Basophils Absolute: 0.1 x10E3/uL (ref 0.0–0.2)
Basos: 1 %
EOS (ABSOLUTE): 0.2 x10E3/uL (ref 0.0–0.4)
Eos: 2 %
Hematocrit: 39.9 % (ref 34.0–46.6)
Hemoglobin: 13.2 g/dL (ref 11.1–15.9)
Immature Grans (Abs): 0.2 x10E3/uL — ABNORMAL HIGH (ref 0.0–0.1)
Immature Granulocytes: 2 %
Lymphocytes Absolute: 1.8 x10E3/uL (ref 0.7–3.1)
Lymphs: 19 %
MCH: 29.5 pg (ref 26.6–33.0)
MCHC: 33.1 g/dL (ref 31.5–35.7)
MCV: 89 fL (ref 79–97)
Monocytes Absolute: 0.9 x10E3/uL (ref 0.1–0.9)
Monocytes: 10 %
Neutrophils Absolute: 6.1 x10E3/uL (ref 1.4–7.0)
Neutrophils: 66 %
Platelets: 370 x10E3/uL (ref 150–450)
RBC: 4.48 x10E6/uL (ref 3.77–5.28)
RDW: 11.8 % (ref 11.7–15.4)
WBC: 9.3 x10E3/uL (ref 3.4–10.8)

## 2024-06-20 LAB — BASIC METABOLIC PANEL WITH GFR
BUN/Creatinine Ratio: 20 (ref 12–28)
BUN: 18 mg/dL (ref 8–27)
CO2: 21 mmol/L (ref 20–29)
Calcium: 9.8 mg/dL (ref 8.7–10.3)
Chloride: 101 mmol/L (ref 96–106)
Creatinine, Ser: 0.91 mg/dL (ref 0.57–1.00)
Glucose: 87 mg/dL (ref 70–99)
Potassium: 4.7 mmol/L (ref 3.5–5.2)
Sodium: 139 mmol/L (ref 134–144)
eGFR: 64 mL/min/1.73 (ref >59)

## 2024-06-22 ENCOUNTER — Other Ambulatory Visit: Payer: Self-pay

## 2024-06-22 ENCOUNTER — Telehealth: Payer: Self-pay | Admitting: Physician Assistant

## 2024-06-22 ENCOUNTER — Other Ambulatory Visit: Payer: Self-pay | Admitting: Physician Assistant

## 2024-06-22 DIAGNOSIS — R351 Nocturia: Secondary | ICD-10-CM

## 2024-06-22 DIAGNOSIS — N390 Urinary tract infection, site not specified: Secondary | ICD-10-CM

## 2024-06-22 DIAGNOSIS — N941 Unspecified dyspareunia: Secondary | ICD-10-CM

## 2024-06-22 DIAGNOSIS — R339 Retention of urine, unspecified: Secondary | ICD-10-CM

## 2024-06-22 DIAGNOSIS — N3946 Mixed incontinence: Secondary | ICD-10-CM

## 2024-06-22 LAB — CULTURE, URINE COMPREHENSIVE

## 2024-06-22 NOTE — Telephone Encounter (Signed)
 Urology referral sent via [facility]. Notified patient. Gave pt telephone # [phone]

## 2024-06-23 ENCOUNTER — Ambulatory Visit (INDEPENDENT_AMBULATORY_CARE_PROVIDER_SITE_OTHER): Admitting: Obstetrics

## 2024-06-23 VITALS — BP 126/75 | HR 86

## 2024-06-23 DIAGNOSIS — R339 Retention of urine, unspecified: Secondary | ICD-10-CM | POA: Diagnosis not present

## 2024-06-23 DIAGNOSIS — N952 Postmenopausal atrophic vaginitis: Secondary | ICD-10-CM | POA: Diagnosis not present

## 2024-06-23 DIAGNOSIS — N390 Urinary tract infection, site not specified: Secondary | ICD-10-CM | POA: Diagnosis not present

## 2024-06-23 LAB — POCT URINALYSIS DIP (CLINITEK)
Bilirubin, UA: NEGATIVE
Glucose, UA: 500 mg/dL — AB
Ketones, POC UA: NEGATIVE mg/dL
Nitrite, UA: NEGATIVE
POC PROTEIN,UA: 100 — AB
Spec Grav, UA: 1.01 (ref 1.010–1.025)
Urobilinogen, UA: 0.2 U/dL
pH, UA: 7 (ref 5.0–8.0)

## 2024-06-23 MED ORDER — CIPROFLOXACIN HCL 750 MG PO TABS: 750.0000 mg | ORAL_TABLET | Freq: Two times a day (BID) | ORAL | 0 refills | Status: AC

## 2024-06-23 NOTE — Progress Notes (Signed)
 [facility] Return Visit  SUBJECTIVE  History of Present Illness: Vanessa Evans is a 78 y.o. female seen in follow-up for UTI symptoms, vaginal atrophy, incomplete bladder emptying, mixed urinary incontinence, stage II pelvic organ prolapse, dyspareunia, history of pelvic surgery, and nocturia. Plan at last visit was start low dose vaginal estrogen, referral to pelvic floor PT, fluid management and reduction of caffeine , titration of Miralax , splinting, changing diuretic dosing.   Urinary symptoms started Saturday after appointment on 06/10/24 Presented to UC 11/1 due to dysuria, urgency, frequency, hematuria, suprapubic discomfort Urine culture with multiple species. Rx Omnicef. Denies relief of symptoms. Called on 06/12/24 due to return of blood in urine. Rx changed to Keflex  QID x 7 days Blood in urine returned after 2 days on Keflex  06/14/24. But symptoms improved.  [facility] home sent out 06/14/24. Negative testing.  Symptoms returned 06/16/24.  Vanessa Evans with a history of yeast infections. Denies fever, chills, nausea/vomiting, or change in baseline back pain today. Reports suprapubic pain and dysuria On Eliquis  Reports feeling drunk on prior use of Levaquin and back/neck pain after Cipro.  Urology appt scheduled next Wednesday due to rUTI and h/o prior negative bladder biopsies that showed  inflammatory response by Dr. Ike (urology) in 2004 or 2005 for gross hematuria and recurrent UTIs.  Past Medical History: Patient  has a past medical history of Arthritis, Asthma, Atrial fib/flutter, transient (HCC), Claustrophobia (04/21/2020), Depression, Dizziness, Dysrhythmia, Fibromyalgia, GERD (gastroesophageal reflux disease), Headache(784.0), Heart failure (HCC), Heart murmur (2019), Hyperlipidemia, Hypertension, Hypothyroidism, Macular degeneration, Migraine, Orthopnea, Pneumonia, PONV (postoperative nausea and vomiting), and Shortness of breath.   Past Surgical  History: She  has a past surgical history that includes Oophorectomy; Breast surgery (Right); Colonoscopy w/ biopsies; Lumbar laminectomy/decompression microdiscectomy (Right, 03/21/2013); Cataract extraction w/PHACO (Left, 12/25/2014); Knee arthroscopy (Right); Vaginal hysterectomy (N/A, 04/09/2016); Cystocele repair (N/A, 04/09/2016); Unilateral salpingectomy (Left, 04/09/2016); Tubal ligation; Esophagogastroduodenoscopy (egd) with propofol  (N/A, 08/23/2017); Colonoscopy with propofol  (N/A, 08/23/2017); Colonoscopy with propofol  (N/A, 11/08/2017); bladder biopsies (2003); Dilation and curettage of uterus (1973); Colonoscopy w/ polypectomy (2002, 2004, 2005); Upper gi endoscopy (2010); Diagnostic laparoscopy (2008); Hernia repair (Right, 2008); Joint replacement (Right, 2008); Back surgery (2014); Eye surgery (Bilateral, 2016); Vaginal hysterectomy (2017); Biopsy thyroid  (2018); Reverse shoulder arthroplasty (Right, 02/22/2018); Breast biopsy (Right); Esophagogastroduodenoscopy (egd) with propofol  (N/A, 06/30/2021); Colonoscopy with propofol  (N/A, 06/30/2021); IR KYPHO THORACIC WITH BONE BIOPSY (05/12/2022); IR Radiologist Eval & Mgmt (05/19/2022); and IR Radiologist Eval & Mgmt (05/26/2022).   Medications: She has a current medication list which includes the following prescription(s): ciprofloxacin, acetaminophen , acidophilus, albuterol , apixaban , aspirin  ec, bisacodyl , black elderberry, calcium  carbonate-vitamin d , cholecalciferol , citalopram , cyanocobalamin , diclofenac  sodium, diltiazem , diphenhydramine , empagliflozin, epinephrine , esomeprazole, estradiol , famotidine , fenofibrate , fluticasone , trelegy ellipta, guaifenesin , carboxymethylcellulose sodium, ipratropium-albuterol , ketotifen , levocetirizine, levothyroxine , lidocaine , losartan , magnesium , melatonin, methocarbamol , metoprolol  succinate, multiple vitamins-minerals, multivitamin with minerals, oxygen -helium, phenazopyridine , polyethylene glycol powder, potassium  chloride sa, raloxifene , rosuvastatin , sodium chloride , spironolactone, and vitamin a.   Allergies: Patient is allergic to codeine, levofloxacin, oxycodone , ultram [tramadol], nitrofurantoin, nsaids, sulfacetamide, adhesive [tape], azithromycin , ciprofloxacin, ketorolac, sulfa antibiotics, tolmetin, and zofran  [ondansetron  hcl].   Social History: Patient  reports that she quit smoking about 58 years ago. Her smoking use included cigarettes. She has never used smokeless tobacco. She reports current alcohol  use of about 1.0 standard drink of alcohol  per week. She reports that she does not use drugs.     OBJECTIVE     Physical Exam: Vitals:   06/23/24 1055  BP: 126/75  Pulse: 86  Physical Exam Constitutional:      General: She is not in acute distress.    Appearance: Normal appearance.  Genitourinary:     Urethral meatus normal.     Right Labia: No rash, tenderness, lesions, skin changes or Bartholin's cyst.    Left Labia: No tenderness, lesions, skin changes, Bartholin's cyst or rash.    Urethral meatus caruncle not present.    No urethral prolapse, tenderness, mass, hypermobility or discharge present.  Cardiovascular:     Rate and Rhythm: Normal rate.  Pulmonary:     Effort: Pulmonary effort is normal. No respiratory distress.  Abdominal:     General: There is no distension.     Palpations: There is no mass.     Tenderness: There is no abdominal tenderness. There is no right CVA tenderness or left CVA tenderness.     Hernia: No hernia is present.  Neurological:     Mental Status: She is alert.  Vitals reviewed. Exam conducted with a chaperone present.   Verbal consent was obtained to perform catheterization:   Urethra was prepped with betadine  and a 80F catheter was placed and bladder was drained completely with PVR. The foley balloon was filled with 10mL sterile water. Stat lock applied with plug placed.     ASSESSMENT AND PLAN    Vanessa Evans is a 78 y.o. with:   1. Incomplete emptying of bladder   2. Vaginal atrophy   3. Complicated UTI (urinary tract infection)     Incomplete emptying of bladder Assessment & Plan: - h/o anterior repair at the time of [facility] for AUB-L and urinary incontinence by Dr. Arloa in 04/09/2016 complicated by urinary retention. Op note noted 1-0 Ethibond sutures used. - 06/08/24 catheterized PVR , repeat today - encouraged splinting to ensure bladder emptying due to outlet obstruction from prolapse vs. Pelvic floor dyssynergia - referral to pelvic floor PT - reviewed management options include pessary trial, pelvic floor PT, CIC or indwelling foley. Patient desires to avoid CIC with foley catheter placed today - return in 1 week for voiding trial, will need urodynamics after resolution of UTI  Orders: -     [facility] Molecular Test; Future -     Ambulatory referral to Infectious Disease  Vaginal atrophy Assessment & Plan: - For symptomatic vaginal atrophy options include lubrication with a water-based lubricant, personal hygiene measures and barrier protection against wetness, and estrogen replacement in the form of vaginal cream, vaginal tablets, or a time-released vaginal ring.  - likely contributing to postcoital spotting and dyspareunia - continue low dose vaginal estrogen, 1g 3x/week    Complicated UTI (urinary tract infection) Assessment & Plan: - increased yeast infection since starting Jardiance - Prior negative bladder biopsies that showed  inflammatory response by Dr. Ike (urology) in 2004 or 2005 for gross hematuria and recurrent UTIs.urine culture 11/1 with multiple species, Rx Omnicef  - changed to keflex  500mg  QID on 11/3 - 06/14/24 negative [facility] - symptoms returned 11/7 - 06/19/24 urine culture > 100K pseudomomas aeruginosa sensitive to Amikacin, Cefepime, Ceftazidime, cipro, levaquin, meropenem, zosyn , tobramycin - no signs or symptoms of pyelonephritis. Tolerating PO - Review  medication allergies and culture sensitivity. Limited PO options except fluoroquinolones - reviewed management options including: ED evaluation, oral antibiotic with fluoroquinolone, and ID referral. - patient declines ED evaluation. Through joint decision making, pt desires to have Rx Cipro. Reports history of back and neck pain, but prefers over sensation of feeling drunk on Levaquin. Discussed risk of  tendinopathy and ototoxicity. Advised pt to seek urgency evaluation if she continue to experience persistent or worsening urinary symptoms such as fever > 100.4, nausea/vomiting, one sided back pain or blood in urine.  - continue low dose vaginal estrogen - will need cystoscopy after resolution of UTI - encouraged to review jardiance with PCP if she continues to experience recurrent UTI - referral to ID placed due to multiple allergies and urine culture sensitivity   Orders: -     Ciprofloxacin HCl; Take 1 tablet (750 mg total) by mouth 2 (two) times daily for 10 days.  Dispense: 20 tablet; Refill: 0 -     POCT URINALYSIS DIP (CLINITEK) -     [facility] Molecular Test; Future -     Ambulatory referral to Infectious Disease  Time spent: I spent 38 minutes dedicated to the care of this patient on the date of this encounter to include pre-visit review of records, face-to-face time with the patient discussing complicated UTI, vaginal atrophy, incomplete emptying of bladder, and post visit documentation and ordering medication/ testing.   Lianne ONEIDA Gillis, MD

## 2024-06-23 NOTE — Assessment & Plan Note (Addendum)
-   increased yeast infection since starting Jardiance - Prior negative bladder biopsies that showed  inflammatory response by Dr. Ike (urology) in 2004 or 2005 for gross hematuria and recurrent UTIs.urine culture 11/1 with multiple species, Rx Omnicef  - changed to keflex  500mg  QID on 11/3 - 06/14/24 negative [facility] - symptoms returned 11/7 - 06/19/24 urine culture > 100K pseudomomas aeruginosa sensitive to Amikacin, Cefepime, Ceftazidime, cipro, levaquin, meropenem, zosyn , tobramycin - no signs or symptoms of pyelonephritis. Tolerating PO - Review medication allergies and culture sensitivity. Limited PO options except fluoroquinolones - reviewed management options including: ED evaluation, oral antibiotic with fluoroquinolone, and ID referral. - patient declines ED evaluation. Through joint decision making, pt desires to have Rx Cipro. Reports history of back and neck pain, but prefers over sensation of feeling drunk on Levaquin. Discussed risk of tendinopathy and ototoxicity. Advised pt to seek urgency evaluation if she continue to experience persistent or worsening urinary symptoms such as fever > 100.4, nausea/vomiting, one sided back pain or blood in urine.  - continue low dose vaginal estrogen - will need cystoscopy after resolution of UTI - encouraged to review jardiance with PCP if she continues to experience recurrent UTI - referral to ID placed due to multiple allergies and urine culture sensitivity

## 2024-06-23 NOTE — Progress Notes (Signed)
 Vanessa Evans arrived today with cloudy urine, dysuria, and urinary frequency. Patient is notexperiencing fever, unstable vitals and/or one-sided back flank pain. Patient has not had a recent hospitalization due to UTI.  Last visit in the office was 06/12/2024.  Per protocol:   The most recent Urinalysis completed on 06-19-2024 and was notnormal.  Last Creatinine level  Lab Results  Component Value Date   CREATININE 0.91 06/19/2024    An urine specimen was collected and POCT urinalysis completed. [] A cath specimen was collected due to patient's current condition, symptoms or post-procedural state.  Total urine output by catheter is  Output by Drain (mL) 06/21/24 0701 - 06/21/24 1900 06/21/24 1901 - 06/22/24 0700 06/22/24 0701 - 06/22/24 1900 06/22/24 1901 - 06/23/24 0700 06/23/24 0701 - 06/23/24 1012  Patient has no LDAs of requested type attached.    SABRA    POCT Urine results is not normal.  We have sent her urine to [facility] for abnormal urinalysis.     [] Pt was notified of positive urine results and plan for additional urine testing. We will contact you within the next 3-4 days with these results.  [] No Prescription was sent to your pharmacy.  The additional testing will indicate if a prescription is needed.   [x] Patient was notified of abnormal urine results. The following prescription is sent to your preferred pharmacy.Cipro 750mg  twice a day for 10 days    []  Due to your current medication allergies, an alternate prescription was discussed with your provider and will be prescribed and sent to your pharmacy.  [] You can take over the counter AZO two tablets up to three times a day for two days.  Take AZO tablets with a full glass of water. AZO will turn your urine orange, this is normal.   [] The patient was notified of negative urine results.  If symptoms persist, you may take over the counter AZO two tablets up to three times a day for two days.  AZO will turn your urine orange, this is  normal.  Contact the office back to schedule an appointment if your symptoms persist or worsen or you develop additional symptoms.    PVR on exam today      CC'd note to patient's provider.

## 2024-06-23 NOTE — Assessment & Plan Note (Addendum)
-   h/o anterior repair at the time of [facility] for AUB-L and urinary incontinence by Dr. Arloa in 04/09/2016 complicated by urinary retention. Op note noted 1-0 Ethibond sutures used. - 06/08/24 catheterized PVR , repeat today - encouraged splinting to ensure bladder emptying due to outlet obstruction from prolapse vs. Pelvic floor dyssynergia - referral to pelvic floor PT - reviewed management options include pessary trial, pelvic floor PT, CIC or indwelling foley. Patient desires to avoid CIC with foley catheter placed today - return in 1 week for voiding trial, will need urodynamics after resolution of UTI

## 2024-06-23 NOTE — Assessment & Plan Note (Signed)
-   For symptomatic vaginal atrophy options include lubrication with a water-based lubricant, personal hygiene measures and barrier protection against wetness, and estrogen replacement in the form of vaginal cream, vaginal tablets, or a time-released vaginal ring.  - likely contributing to postcoital spotting and dyspareunia - continue low dose vaginal estrogen, 1g 3x/week

## 2024-06-23 NOTE — Patient Instructions (Addendum)
 Start Ciprofloxacin 750mg  twice daily, stop if you experience back or neck pain and call the office or seek urgent evaluation.  Please call if you continue to experience persistent or worsening urinary symptoms such as fever > 100.4, nausea/vomiting, one sided back pain or blood in your urine.   Continue foley catheter draining.

## 2024-06-26 ENCOUNTER — Inpatient Hospital Stay

## 2024-06-26 ENCOUNTER — Ambulatory Visit

## 2024-06-28 ENCOUNTER — Ambulatory Visit: Admitting: Physician Assistant

## 2024-06-29 ENCOUNTER — Ambulatory Visit

## 2024-06-29 NOTE — Progress Notes (Signed)
 Vanessa Evans underwent Esophagogastroduodenoscopy (egd) With Propofol  and Colonoscopy With Propofol  on 06/30/2021  She presents for a voiding trial.   Patient was identified with 2 identifiers.  The patient states she does not have any concerns with the foley placed.  210 mL of NS was instilled into the bladder via a catheter.  The catheter was removed and patient was instructed to void into the urinary hat.  She voided 300 mL.  The post void residual measured by bladder scan was 17 mL.  She did pass the voiding trial.  The patient was not sent home with a catheter.    The patient received aftercare instructions and will follow up as scheduled.

## 2024-06-29 NOTE — Patient Instructions (Signed)
  Please drink lots of water  and try to expel as much urine as you are inputting. If you are unable to urinate, give the office a call before 3 pm.     Please keep all future appointments and if you have any questions or concerns please feel free to contact our office at [phone].

## 2024-07-04 ENCOUNTER — Ambulatory Visit: Attending: Infectious Diseases | Admitting: Infectious Diseases

## 2024-07-04 ENCOUNTER — Encounter: Payer: Self-pay | Admitting: Infectious Diseases

## 2024-07-04 VITALS — BP 125/71 | HR 76 | Temp 97.9°F | Ht 59.0 in | Wt 143.0 lb

## 2024-07-04 DIAGNOSIS — B965 Pseudomonas (aeruginosa) (mallei) (pseudomallei) as the cause of diseases classified elsewhere: Secondary | ICD-10-CM | POA: Insufficient documentation

## 2024-07-04 DIAGNOSIS — N958 Other specified menopausal and perimenopausal disorders: Secondary | ICD-10-CM | POA: Diagnosis not present

## 2024-07-04 DIAGNOSIS — Z7901 Long term (current) use of anticoagulants: Secondary | ICD-10-CM | POA: Insufficient documentation

## 2024-07-04 DIAGNOSIS — I110 Hypertensive heart disease with heart failure: Secondary | ICD-10-CM | POA: Diagnosis not present

## 2024-07-04 DIAGNOSIS — K5900 Constipation, unspecified: Secondary | ICD-10-CM | POA: Insufficient documentation

## 2024-07-04 DIAGNOSIS — R7303 Prediabetes: Secondary | ICD-10-CM | POA: Insufficient documentation

## 2024-07-04 DIAGNOSIS — A498 Other bacterial infections of unspecified site: Secondary | ICD-10-CM

## 2024-07-04 DIAGNOSIS — R319 Hematuria, unspecified: Secondary | ICD-10-CM | POA: Diagnosis not present

## 2024-07-04 DIAGNOSIS — R339 Retention of urine, unspecified: Secondary | ICD-10-CM

## 2024-07-04 DIAGNOSIS — I509 Heart failure, unspecified: Secondary | ICD-10-CM | POA: Diagnosis not present

## 2024-07-04 DIAGNOSIS — Z7984 Long term (current) use of oral hypoglycemic drugs: Secondary | ICD-10-CM | POA: Diagnosis not present

## 2024-07-04 DIAGNOSIS — N390 Urinary tract infection, site not specified: Secondary | ICD-10-CM | POA: Diagnosis present

## 2024-07-04 DIAGNOSIS — N3090 Cystitis, unspecified without hematuria: Secondary | ICD-10-CM

## 2024-07-04 DIAGNOSIS — N8189 Other female genital prolapse: Secondary | ICD-10-CM

## 2024-07-04 NOTE — Progress Notes (Signed)
 NAME: Vanessa Evans  DOB: January 22, 1946  MRN: [id number]  Date/Time: 07/04/2024 11:47 AM  Subjective:   ?referred by Uro gyn Dr.Yuen for pseudomonas UTI and cipro  adverse effect   Vanessa Evans is a 78 year old female with a history of urinary tract infections who presents for evaluation of a recent UTI caused by pseudomonas. She was referred by , her urogynecologist, for evaluation of a UTI.  She experienced a urinary tract infection beginning on June 10, 2024, characterized by urgency, pain, and significant hematuria, including episodes of urinating pure blood. The infection was identified as being caused by pseudomonas and was given a prescription for cipro - pt was sent to me as well as she  had h/o pain neck with cipro - but she chose to take it as the alternative was iV antibiotic- She completed treatment and did not experience any side effects  , completed around July 01, 2024. The ciprofloxacin  was effective, and her symptoms have resolved. No fever was reported during this episode.  She has a history of urinary retention and bladder prolapse, previously managed with bladder tacking in 2017. A post-void bladder scan on June 08, 2024, showed a retention of 250 cc, necessitating catheterization for over a week. She uses Estrace  cream to manage menopausal symptoms affecting the bladder and vagina.  Her past medical history includes congestive heart failure, for which she takes Jardiance, and prediabetes. She has experienced recurrent yeast infections, which she attributes to Jardiance, and takes probiotics to manage gastrointestinal health and prevent yeast infections. She also has a history of diverticulitis treated with antibiotics two years ago and is currently on Eliquis  following a hospitalization for type A flu, which led to AFib and heart failure.  She uses Benefiber to manage constipation.  Past Medical History:  Diagnosis Date   Arthritis    osteo   Asthma    needs  rescue inhaler few times a year   Atrial fib/flutter, transient (HCC)    Claustrophobia 04/21/2020   Depression    Dizziness    light headed spells but it has been a while   Dysrhythmia    tachycardia.(cardizem ). brady during procedures   Fibromyalgia    GERD (gastroesophageal reflux disease)    gastritis   Headache(784.0)    Heart failure (HCC)    Heart murmur 2019   aortic. dr. hester not worried about this   Hyperlipidemia    Hypertension    Hypothyroidism    Macular degeneration    Migraine    Orthopnea    Pneumonia    long time ago (greater than 5 years ago)   PONV (postoperative nausea and vomiting)    very anxious during cataract surgery. brady during colonoscopy   Shortness of breath    any exertion, cannot lie flat    Past Surgical History:  Procedure Laterality Date   BACK SURGERY  2014   removed bone to get to a benign tumor that was pushing on spinal column   BIOPSY THYROID   2018   bladder biopsies  2003   9 biopsies done by dr. cope.  all benign.  inflammatory process going on in bladder   BREAST BIOPSY Right    benign   BREAST SURGERY Right    lumpectomy   CATARACT EXTRACTION W/PHACO Left 12/25/2014   Procedure: CATARACT EXTRACTION PHACO AND INTRAOCULAR LENS PLACEMENT (IOC);  Surgeon: Elsie Carmine, MD;  Location: [facility] ORS;  Service: Ophthalmology;  Laterality: Left;  US  00:32 AP% 20.0 CDE 6.49  COLONOSCOPY W/ BIOPSIES     COLONOSCOPY W/ POLYPECTOMY  2002, 2004, 2005   adenomatous polyps removed   COLONOSCOPY WITH PROPOFOL  N/A 08/23/2017   Procedure: COLONOSCOPY WITH PROPOFOL ;  Surgeon: Viktoria Lamar DASEN, MD;  Location: [facility] ENDOSCOPY;  Service: Endoscopy;  Laterality: N/A;   COLONOSCOPY WITH PROPOFOL  N/A 11/08/2017   Procedure: COLONOSCOPY WITH PROPOFOL ;  Surgeon: Viktoria Lamar DASEN, MD;  Location: [facility] ENDOSCOPY;  Service: Endoscopy;  Laterality: N/A;   COLONOSCOPY WITH PROPOFOL  N/A 06/30/2021   Procedure: COLONOSCOPY WITH PROPOFOL ;  Surgeon:  Maryruth Ole DASEN, MD;  Location: [facility] ENDOSCOPY;  Service: Endoscopy;  Laterality: N/A;   CYSTOCELE REPAIR N/A 04/09/2016   Procedure: ANTERIOR REPAIR (CYSTOCELE);  Surgeon: Lamar SHAUNNA Lesches, MD;  Location: [facility] ORS;  Service: Gynecology;  Laterality: N/A;   DIAGNOSTIC LAPAROSCOPY  2008   removed both ovaries with cysts, tubes and fibroids   DILATION AND CURETTAGE OF UTERUS  1973   ESOPHAGOGASTRODUODENOSCOPY (EGD) WITH PROPOFOL  N/A 08/23/2017   Procedure: ESOPHAGOGASTRODUODENOSCOPY (EGD) WITH PROPOFOL ;  Surgeon: Viktoria Lamar DASEN, MD;  Location: [facility] ENDOSCOPY;  Service: Endoscopy;  Laterality: N/A;   ESOPHAGOGASTRODUODENOSCOPY (EGD) WITH PROPOFOL  N/A 06/30/2021   Procedure: ESOPHAGOGASTRODUODENOSCOPY (EGD) WITH PROPOFOL ;  Surgeon: Maryruth Ole DASEN, MD;  Location: [facility] ENDOSCOPY;  Service: Endoscopy;  Laterality: N/A;   EYE SURGERY Bilateral 2016   HERNIA REPAIR Right 2008   Inguinal hernia Repair, ventral hernia repair   IR KYPHO THORACIC WITH BONE BIOPSY  05/12/2022   IR RADIOLOGIST EVAL & MGMT  05/19/2022   IR RADIOLOGIST EVAL & MGMT  05/26/2022   JOINT REPLACEMENT Right 2008   knee   KNEE ARTHROSCOPY Right    LUMBAR LAMINECTOMY/DECOMPRESSION MICRODISCECTOMY Right 03/21/2013   Procedure: Right Lumbar five-Sacral one Laminectomy for Synovial Cyst;  Surgeon: Darina MALVA Boehringer, MD;  Location: [facility]URO ORS;  Service: Neurosurgery;  Laterality: Right;  right   OOPHORECTOMY     REVERSE SHOULDER ARTHROPLASTY Right 02/22/2018   Procedure: REVERSE SHOULDER ARTHROPLASTY;  Surgeon: Edie Norleen PARAS, MD;  Location: [facility] ORS;  Service: Orthopedics;  Laterality: Right;   TUBAL LIGATION     UNILATERAL SALPINGECTOMY Left 04/09/2016   Procedure: UNILATERAL SALPINGECTOMY;  Surgeon: Lamar SHAUNNA Lesches, MD;  Location: [facility] ORS;  Service: Gynecology;  Laterality: Left;   UPPER GI ENDOSCOPY  2010   with biopsy of gastric erosion   VAGINAL HYSTERECTOMY N/A 04/09/2016   Procedure: HYSTERECTOMY VAGINAL;  Surgeon:  Lamar SHAUNNA Lesches, MD;  Location: [facility] ORS;  Service: Gynecology;  Laterality: N/A;   VAGINAL HYSTERECTOMY  2017   and bladder tack    Social History   Socioeconomic History   Marital status: Married    Spouse name: Not on file   Number of children: Not on file   Years of education: Not on file   Highest education level: Not on file  Occupational History   Not on file  Tobacco Use   Smoking status: Former    Current packs/day: 0.00    Types: Cigarettes    Quit date: 15    Years since quitting: 58.9   Smokeless tobacco: Never  Vaping Use   Vaping status: Never Used  Substance and Sexual Activity   Alcohol  use: Yes    Alcohol /week: 1.0 standard drink of alcohol     Types: 1 Glasses of wine per week    Comment: occasionally   Drug use: No   Sexual activity: Not Currently    Partners: Male    Birth control/protection: None  Other  Topics Concern   Not on file  Social History Narrative   Not on file   Social Drivers of Health   Financial Resource Strain: Not on file  Food Insecurity: No Food Insecurity (10/01/2023)   Hunger Vital Sign    Worried About Running Out of Food in the Last Year: Never true    Ran Out of Food in the Last Year: Never true  Transportation Needs: No Transportation Needs (10/01/2023)   PRAPARE - Administrator, Civil Service (Medical): No    Lack of Transportation (Non-Medical): No  Physical Activity: Not on file  Stress: Not on file  Social Connections: Unknown (09/12/2023)   Social Connection and Isolation Panel    Frequency of Communication with Friends and Family: More than three times a week    Frequency of Social Gatherings with Friends and Family: More than three times a week    Attends Religious Services: More than 4 times per year    Active Member of Clubs or Organizations: Patient unable to answer    Attends Banker Meetings: Patient unable to answer    Marital Status: Married  Catering Manager Violence: Not At  Risk (10/01/2023)   Humiliation, Afraid, Rape, and Kick questionnaire    Fear of Current or Ex-Partner: No    Emotionally Abused: No    Physically Abused: No    Sexually Abused: No    Family History  Problem Relation Age of Onset   Pulmonary fibrosis Mother    Melanoma Father    COPD Sister    Cardiomyopathy Sister        and arrhythmia   Atrial fibrillation Sister    COPD Brother    Pulmonary fibrosis Brother    Cancer Brother    Breast cancer Cousin        paternal 1st   Bladder Cancer Neg Hx    Uterine cancer Neg Hx    Renal cancer Neg Hx    Allergies  Allergen Reactions   Codeine Shortness Of Breath and Itching    Bronchospasms and asthma attach   Levofloxacin     Altered mental status: drunk feeling, confused, speech problems Other reaction(s): Unknown    Oxycodone  Itching and Shortness Of Breath    Would take benadryl  to eliminate itching. Has had bronchospasm   Ultram [Tramadol] Itching and Other (See Comments)    Bronchospasm and asthma attack   Nitrofurantoin Diarrhea    Tried again and had no problems with it Severe and long lasting   Nsaids Other (See Comments)    Bloody stools, abdominal pain, ulcer History of gastritis   Sulfacetamide Rash    Fever, abdominal pain   Adhesive [Tape] Other (See Comments)    Thin skin. Causes tears. Paper tape okay   Azithromycin      Unsure. Patient does not remember but thinks that it did not work for her.   Ciprofloxacin  Other (See Comments)    Severe pain in neck and down back    Ketorolac Itching   Sulfa Antibiotics Itching, Rash and Other (See Comments)    fever   Tolmetin     Other reaction(s): Other (See Comments) Bloody stools, abdominal pain History of chronic gastritis   Zofran  [Ondansetron  Hcl] Other (See Comments)    constipation   I? Current Outpatient Medications  Medication Sig Dispense Refill   acetaminophen  (TYLENOL ) 500 MG tablet Take 1,000 mg by mouth 2 (two) times daily as needed for  moderate pain.  acidophilus (RISAQUAD) CAPS capsule Take 1 capsule by mouth daily.     albuterol  (VENTOLIN  HFA) 108 (90 Base) MCG/ACT inhaler Inhale 2 puffs into the lungs every 6 (six) hours as needed for wheezing or shortness of breath. 8.5 g 1   apixaban  (ELIQUIS ) 5 MG TABS tablet Take 1 tablet by mouth 2 (two) times daily.     aspirin  EC 81 MG tablet Take 81 mg by mouth at bedtime.      benzonatate  (TESSALON ) 100 MG capsule SMARTSIG:2 By Mouth Twice Daily     bisacodyl  (DULCOLAX) 5 MG EC tablet Take 1 tablet (5 mg total) by mouth at bedtime as needed for moderate constipation. 30 tablet 0   BLACK ELDERBERRY PO Take 1 Dose by mouth 2 (two) times a day.     Calcium  Carbonate-Vitamin D  600-400 MG-UNIT tablet Take 1 tablet by mouth daily.     cholecalciferol  (VITAMIN D ) 1000 UNITS tablet Take 1,000 Units by mouth at bedtime.      citalopram  (CELEXA ) 20 MG tablet Take 1 tablet (20 mg total) by mouth daily. 90 tablet 1   cyanocobalamin  500 MCG tablet Take 500 mcg by mouth daily.     diclofenac  Sodium (VOLTAREN ) 1 % GEL Apply 4 g topically 4 (four) times daily. 500 g 3   diltiazem  (CARDIZEM  CD) 120 MG 24 hr capsule Take 120 mg by mouth daily.     diphenhydrAMINE  (BENADRYL ) 25 MG tablet Take 25-50 mg by mouth daily as needed for itching.     empagliflozin (JARDIANCE) 10 MG TABS tablet Take 10 mg by mouth daily.     EPINEPHrine  (EPIPEN  2-PAK) 0.3 mg/0.3 mL IJ SOAJ injection use as directed for severe allergy reaction 2 each 1   esomeprazole (NEXIUM) 40 MG capsule Take 40 mg by mouth daily.     estradiol  (ESTRACE ) 0.01 % CREA vaginal cream Place 0.5g nightly for two weeks then twice a week after 30 g 11   famotidine  (PEPCID ) 20 MG tablet Take 20 mg by mouth 2 (two) times daily.     fenofibrate  160 MG tablet Take 1 tablet (160 mg total) by mouth daily. for cholesterol 90 tablet 0   fluticasone  (FLONASE ) 50 MCG/ACT nasal spray Use 2 sprays in each nostril daily, 16 g 2   Fluticasone -Umeclidin-Vilant  (TRELEGY ELLIPTA) 100-62.5-25 MCG/ACT AEPB Inhale 100 mcg into the lungs daily.     guaiFENesin  (MUCINEX ) 600 MG 12 hr tablet Take 600 mg by mouth at bedtime as needed (congestion).     Hypromellose (ARTIFICIAL TEARS OP) Place 1-2 drops into both eyes daily as needed (dry eyes).     ipratropium-albuterol  (DUONEB) 0.5-2.5 (3) MG/3ML SOLN Take 3 mLs by nebulization every 6 (six) hours as needed. 360 mL 0   ketotifen  (ZADITOR ) 0.025 % ophthalmic solution Place 1 drop into both eyes 2 (two) times daily as needed (allergies).     levocetirizine (XYZAL ) 5 MG tablet Take 1 tablet (5 mg total) by mouth every evening. 90 tablet 1   levothyroxine  (SYNTHROID , LEVOTHROID) 50 MCG tablet Take 50 mcg by mouth daily before breakfast. Brand Name only     lidocaine  (XYLOCAINE ) 5 % ointment Apply 1 Application topically as needed. 35.44 g 0   losartan  (COZAAR ) 25 MG tablet Take 25 mg by mouth daily.     Magnesium  250 MG TABS Take 1 tablet by mouth daily.     Melatonin 5 MG TABS Take 1 tablet by mouth at bedtime. For sleep     methocarbamol  (ROBAXIN )  500 MG tablet Take 1 tablet (500 mg total) by mouth 2 (two) times daily as needed for muscle spasms. TAKE ONE-HALF TO 1 TABLET TWICE DAILY AS NEEDED. 30 tablet 1   metoprolol  succinate (TOPROL -XL) 25 MG 24 hr tablet Take 1 tablet (25 mg total) by mouth 2 (two) times daily. 60 tablet 11   Multiple Vitamins-Minerals (EYE VITAMINS PO) Take by mouth.     Multiple Vitamins-Minerals (MULTIVITAMIN WITH MINERALS) tablet Take 1 tablet by mouth daily.     OXYGEN  Inhale into the lungs. 2 litre at night     phenazopyridine  (PYRIDIUM ) 200 MG tablet Take 1 tablet (200 mg total) by mouth 3 (three) times daily. 6 tablet 0   polyethylene glycol powder (GLYCOLAX /MIRALAX ) 17 GM/SCOOP powder Take 17 g by mouth daily. 238 g 0   raloxifene  (EVISTA ) 60 MG tablet Take 60 mg by mouth daily.     rosuvastatin  (CRESTOR ) 5 MG tablet Take 1 tablet (5 mg total) by mouth daily. 90 tablet 1   sodium  chloride (OCEAN) 0.65 % SOLN nasal spray Place 1 spray into both nostrils as needed for congestion.     spironolactone (ALDACTONE) 25 MG tablet Take 25 mg by mouth once.     sucralfate  (CARAFATE ) 1 g tablet Take 1 g by mouth 3 (three) times daily as needed     Vitamin A 7.5 MG (25000 UT) CAPS Take 1 tablet by mouth 2 (two) times daily.     No current facility-administered medications for this visit.     Abtx:  Anti-infectives (From admission, onward)    None       REVIEW OF SYSTEMS:  Const: negative fever, negative chills, negative weight loss Eyes: negative diplopia or visual changes, negative eye pain ENT: negative coryza, negative sore throat Resp: negative cough, hemoptysis, dyspnea Cards: negative for chest pain, palpitations, lower extremity edema GU: negative for frequency, dysuria and hematuria GI: Negative for abdominal pain, diarrhea, bleeding, constipation Skin: negative for rash and pruritus Heme: negative for easy bruising and gum/nose bleeding MS: negative for myalgias, arthralgias, back pain and muscle weakness Neurolo:negative for headaches, dizziness, vertigo, memory problems  Psych: negative for feelings of anxiety, depression  Endocrine: negative for thyroid , diabetes Allergy/Immunology-as above Objective:  VITALS:  BP 125/71   Pulse 76   Temp 97.9 F (36.6 C) (Temporal)   Ht 4' 11 (1.499 m)   Wt 143 lb (64.9 kg)   SpO2 92%   BMI 28.88 kg/m   PHYSICAL EXAM:  General: Alert, cooperative, no distress, appears stated age.   Back: No CVA tenderness. Lungs: Clear to auscultation bilaterally. No Wheezing or Rhonchi. No rales. Heart: Regular rate and rhythm, no murmur, rub or gallop. Abdomen: Soft, non-tender,not distended. Bowel sounds normal. No masses Extremities: atraumatic, no cyanosis. No edema. No clubbing Skin: No rashes or lesions. Or bruising Lymph: Cervical, supraclavicular normal. Neurologic: Grossly non-focal Pertinent Labs Lab  Results CBC    Component Value Date/Time   WBC 9.3 06/19/2024 1408   WBC 5.7 02/18/2024 1502   WBC 6.0 11/10/2023 1035   RBC 4.48 06/19/2024 1408   RBC 4.07 02/18/2024 1502   HGB 13.2 06/19/2024 1408   HCT 39.9 06/19/2024 1408   PLT 370 06/19/2024 1408   MCV 89 06/19/2024 1408   MCH 29.5 06/19/2024 1408   MCH 29.2 02/18/2024 1502   MCHC 33.1 06/19/2024 1408   MCHC 32.1 02/18/2024 1502   RDW 11.8 06/19/2024 1408   LYMPHSABS 1.8 06/19/2024 1408   MONOABS 0.8  02/18/2024 1502   EOSABS 0.2 06/19/2024 1408   BASOSABS 0.1 06/19/2024 1408       Latest Ref Rng & Units 06/19/2024    2:08 PM 09/17/2023    4:38 AM 09/16/2023    4:18 AM  CMP  Glucose 70 - 99 mg/dL 87  95  861   BUN 8 - 27 mg/dL 18  19  20    Creatinine 0.57 - 1.00 mg/dL 9.08  9.38  9.40   Sodium 134 - 144 mmol/L 139  134  131   Potassium 3.5 - 5.2 mmol/L 4.7  3.7  3.5   Chloride 96 - 106 mmol/L 101  98  95   CO2 20 - 29 mmol/L 21  26  25    Calcium  8.7 - 10.3 mg/dL 9.8  8.6  8.9       Microbiology: No results found for this or any previous visit (from the past 240 hours).   IMAGING RESULTS: I have personally reviewed the films ? Impression/Recommendation Recurrent urinary tract infection with hematuria and urinary retention in the setting of pelvic organ prolapse and genitourinary syndrome of menopause Recurrent UTI with hematuria and urinary retention, likely exacerbated by genitourinary syndrome of menopause and pelvic organ prolapse.   Pseudomonas UTI /cystitis tolerated cipro  and was very effective- symptoms have resolved  No further treatment needed    Estrace  cream used for genitourinary syndrome of menopause. Potential side effects of Jardiance, including UTIs and yeast infections, discussed. Eliquis  may contribute to hematuria. - Continue Estrace  cream three times a week. - Discuss with cardiologist the potential side effects of Jardiance, including UTIs and yeast infections. - Consider reducing  caffeine  intake. - Use Benefiber for constipation management. - Stay hydrated, avoiding excessive fluid intake in the evening. - Use Cetaphil soap for external washing, avoid internal washing. - Consider cranberry supplements or diluted cranberry conc - Use probiotics for gastrointestinal and vaginal health. - Wash with water after bowel movements to prevent infection. - Sent message to Dr. Guadlupe regarding UTI treatment duration.( 3-5 days of cipro  adequate )  Constipation Management discussed as part of overall urinary health strategy. - Use Benefiber for constipation management.? ? ?__________________________ Discussed with patient, and her husband  Discharged from ID clinic

## 2024-07-04 NOTE — Patient Instructions (Addendum)
 You came in today to discuss a recent urinary tract infection (UTI) caused by pseudomonas. You experienced symptoms such as urgency, pain, and significant blood in your urine, but these have resolved after completing a course of ciprofloxacin . We also reviewed your history of urinary retention, bladder prolapse, and other health conditions.  YOUR PLAN:  -RECURRENT URINARY TRACT INFECTION WITH HEMATURIA AND URINARY RETENTION: A urinary tract infection (UTI) is an infection in any part of your urinary system. Your recent UTI was likely worsened by your bladder prolapse and menopausal symptoms. Continue using Estrace  cream three times a week to manage menopausal symptoms. Discuss with your cardiologist the potential side effects of Jardiance, including UTIs and yeast infections. Consider reducing your caffeine  intake and stay hydrated, but avoid excessive fluid intake in the evening. Use Cetaphil soap for external washing and avoid internal washing. Consider cranberry supplements or diluted cranberry juice, and use probiotics for gastrointestinal and vaginal health. Wash with water after bowel movements to prevent infection. -CONSTIPATION: Constipation is when you have difficulty emptying your bowels. Continue using Benefiber to help manage this issue.  INSTRUCTIONS:  Please follow up with your cardiologist to discuss the potential side effects of Jardiance. Continue using Estrace  cream as directed and maintain your current regimen for managing constipation. Stay hydrated and consider the dietary and hygiene recommendations provided. We will update you once we hear back from Dr. Orlan regarding the duration of your UTI treatment.  You have symptoms of genitourinary syndrome of menopause. To prevent a UTI please try the following  *Estrace  /Premarin cream topically- peasized apply topically three times a week * Cetaphil to clean the genital area ( not soap)  * Cranberry supplement (-Knudsen cranberry  concentrate- 1 ounce mixed with 8 ounces of water *wash with water after bowel movt *Probiotic for vaginal health( can try [employer]ls vaginal health) * Increase water consumption- 8 glasses a day *Avoid constipation *Avoid antibiotics unless systemic infection/ or before cystoscopy * Kegel Exercise to strengthen pelvic floor

## 2024-07-05 ENCOUNTER — Other Ambulatory Visit: Payer: Self-pay | Admitting: *Deleted

## 2024-07-05 DIAGNOSIS — D509 Iron deficiency anemia, unspecified: Secondary | ICD-10-CM

## 2024-07-10 ENCOUNTER — Inpatient Hospital Stay: Attending: Oncology

## 2024-07-10 DIAGNOSIS — D509 Iron deficiency anemia, unspecified: Secondary | ICD-10-CM | POA: Insufficient documentation

## 2024-07-10 LAB — CBC WITH DIFFERENTIAL (CANCER CENTER ONLY)
Abs Immature Granulocytes: 0.02 K/uL (ref 0.00–0.07)
Basophils Absolute: 0 K/uL (ref 0.0–0.1)
Basophils Relative: 1 %
Eosinophils Absolute: 0.1 K/uL (ref 0.0–0.5)
Eosinophils Relative: 3 %
HCT: 39 % (ref 36.0–46.0)
Hemoglobin: 12.4 g/dL (ref 12.0–15.0)
Immature Granulocytes: 0 %
Lymphocytes Relative: 28 %
Lymphs Abs: 1.4 K/uL (ref 0.7–4.0)
MCH: 29 pg (ref 26.0–34.0)
MCHC: 31.8 g/dL (ref 30.0–36.0)
MCV: 91.3 fL (ref 80.0–100.0)
Monocytes Absolute: 0.7 K/uL (ref 0.1–1.0)
Monocytes Relative: 14 %
Neutro Abs: 2.7 K/uL (ref 1.7–7.7)
Neutrophils Relative %: 54 %
Platelet Count: 318 K/uL (ref 150–400)
RBC: 4.27 MIL/uL (ref 3.87–5.11)
RDW: 13.1 % (ref 11.5–15.5)
WBC Count: 4.9 K/uL (ref 4.0–10.5)
nRBC: 0 % (ref 0.0–0.2)

## 2024-07-10 LAB — IRON AND TIBC
Iron: 73 ug/dL (ref 28–170)
Saturation Ratios: 14 % (ref 10.4–31.8)
TIBC: 514 ug/dL — ABNORMAL HIGH (ref 250–450)
UIBC: 441 ug/dL

## 2024-07-10 LAB — FERRITIN: Ferritin: 212 ng/mL (ref 11–307)

## 2024-07-12 ENCOUNTER — Other Ambulatory Visit: Payer: Self-pay | Admitting: Physician Assistant

## 2024-07-12 DIAGNOSIS — Z1231 Encounter for screening mammogram for malignant neoplasm of breast: Secondary | ICD-10-CM

## 2024-07-13 ENCOUNTER — Ambulatory Visit: Admitting: Podiatry

## 2024-07-17 ENCOUNTER — Encounter

## 2024-07-17 ENCOUNTER — Encounter: Payer: Self-pay | Admitting: Podiatry

## 2024-07-17 ENCOUNTER — Ambulatory Visit: Admitting: Podiatry

## 2024-07-17 DIAGNOSIS — M79674 Pain in right toe(s): Secondary | ICD-10-CM | POA: Diagnosis not present

## 2024-07-17 DIAGNOSIS — B351 Tinea unguium: Secondary | ICD-10-CM

## 2024-07-17 DIAGNOSIS — M79675 Pain in left toe(s): Secondary | ICD-10-CM | POA: Diagnosis not present

## 2024-07-19 ENCOUNTER — Ambulatory Visit: Admitting: Podiatry

## 2024-07-19 ENCOUNTER — Encounter: Payer: Self-pay | Admitting: Podiatry

## 2024-07-19 DIAGNOSIS — M19071 Primary osteoarthritis, right ankle and foot: Secondary | ICD-10-CM | POA: Diagnosis not present

## 2024-07-19 NOTE — Progress Notes (Signed)
°  Subjective:  Patient ID: Vanessa Evans, female    DOB: [birth year],  MRN: [id number]  Chief Complaint  Patient presents with   Injections    RM 8 Patient is here for an injection in the right foot. Pt states pain in the anterior aspect of the right ankle radiating towards the lateral side of the ankle and the dorsal aspect of the foot when walking. Pt states no injury to foot and a past dx of tendonitis.    78 y.o. female presents with the above complaint. History confirmed with patient.  She returns for follow-up today for chronic right foot pain that she has seen Dr. Tobie Dr. Janit and Dr. Verta myself for, was doing fairly well until recently when the same pain recurred again.  Has been painful for the last month.  No injuries that she remembers.  Objective:  Physical Exam: warm, good capillary refill, no trophic changes or ulcerative lesions, normal DP and PT pulses, normal sensory exam, and pain and tenderness in the sinus tarsi and around the subtalar joint medially and laterally as well as the talonavicular joint   Radiographs: Multiple views x-ray of the right foot: Previous radiographs taken in April 2025 showed arthritic changes end-stage of the talonavicular joint moderate to severe arthritis of the subtalar and calcaneocuboid joint Assessment:   1. Arthritis of right subtalar joint      Plan:  Patient was evaluated and treated and all questions answered.  I reviewed her previous x-rays taken in April of this year and discussed with her that she has end-stage arthrosis of the talonavicular joint and moderate to severe arthritis of the subtalar and calcaneocuboid joints discussed treatment of these including surgical and nonsurgical treatments.  Has been sometime since the last injection in the joint I recommend corticosteroid injection.  Following consent and prep of Betadine  and using ethyl chloride spray as a local anesthetic the subtalar joint was injected on the right  side with a sinus tarsi approach with 20 mg of Kenalog  and 5 mg of Marcaine  she tolerated well.  We discussed utilizing OTC braces and long-term may benefit from an [place]  mezzo brace.  Follow-up with me as needed.  No follow-ups on file.

## 2024-07-21 NOTE — Progress Notes (Signed)
 Subjective:  Patient ID: Vanessa Evans, female    DOB: 1945/08/27,  MRN: [id number]  Vanessa Evans presents to clinic today for painful thick toenails that are difficult to trim. Pain interferes with ambulation. Aggravating factors include wearing enclosed shoe gear. Pain is relieved with periodic professional debridement. States she may need another injection in her right ankle. Chief Complaint  Patient presents with   [facility]    Rm2 Diabetic foot care/ A1c 6.0/ Dr. Tinnie Pro last visit October 2025   New problem(s): None.   PCP is McDonough, Tinnie POUR, PA-C.  Allergies[1]  Review of Systems: Negative except as noted in the HPI.  Objective: No changes noted in today's physical examination. There were no vitals filed for this visit. Vanessa Evans is a pleasant 78 y.o. female WD, WN in NAD. AAO x 3.  Vascular Examination: Capillary refill time immediate b/l. Vascular status intact b/l with palpable pedal pulses. Pedal hair present b/l. No pain with calf compression b/l. Skin temperature gradient WNL b/l. No cyanosis or clubbing b/l. No ischemia or gangrene noted b/l.   Neurological Examination: Sensation grossly intact b/l with 10 gram monofilament. Vibratory sensation intact b/l.   Dermatological Examination: Small pressure sore noted medial navicular RLE with tenderness to palpation. No fluctuance, no warmth, no drainage. +Tenderness to palpation.   Pedal skin thin and atrophic b/l.  No interdigital macerations.   Toenails 1-5 b/l thick, discolored, elongated with subungual debris and pain on dorsal palpation.   No corns, calluses, nor porokeratotic lesions.  Musculoskeletal Examination: Muscle strength 5/5 to all lower extremity muscle groups bilaterally. Hammertoe(s) 2-5 b/l.SABRA No pain, crepitus or joint limitation noted with ROM b/l LE.  Patient ambulates independently without assistive aids. Pes planovalgus deformity noted right lower extremity. Prominent 5th  metatarsal bases b/l.  Radiographs: None  Assessment/Plan: 1. Pain due to onychomycosis of toenails of both feet   Patient was evaluated and treated. All patient's and/or POA's questions/concerns addressed on today's visit. Toenails 1-5 b/l debrided in length and girth without incident. Continue soft, supportive shoe gear daily. Report any pedal injuries to medical professional. Call office if there are any questions/concerns. -Patient/POA to call should there be question/concern in the interim.   Return in about 3 months (around 10/15/2024).  Delon LITTIE Merlin, DPM      [place] LOCATION: [address] [place], [place] [address]                   Office [phone]   [place] LOCATION: [address] [place], [place] [address] Office [phone]     [1]  Allergies Allergen Reactions   Codeine Shortness Of Breath and Itching    Bronchospasms and asthma attach   Levofloxacin     Altered mental status: drunk feeling, confused, speech problems Other reaction(s): Unknown    Oxycodone  Itching and Shortness Of Breath    Would take benadryl  to eliminate itching. Has had bronchospasm   Ultram [Tramadol] Itching and Other (See Comments)    Bronchospasm and asthma attack   Nitrofurantoin Diarrhea    Tried again and had no problems with it Severe and long lasting   Nsaids Other (See Comments)    Bloody  stools, abdominal pain, ulcer History of gastritis   Sulfacetamide Rash    Fever, abdominal pain   Adhesive [Tape] Other (See Comments)    Thin skin. Causes tears. Paper tape okay   Azithromycin      Unsure. Patient does not remember but thinks that it did not work for her.   Ciprofloxacin  Other (See Comments)    Severe pain in neck and down back    Ketorolac Itching   Sulfa Antibiotics Itching, Rash and Other (See Comments)    fever   Tolmetin     Other reaction(s): Other (See Comments) Bloody stools, abdominal  pain History of chronic gastritis   Zofran  [Ondansetron  Hcl] Other (See Comments)    constipation

## 2024-07-24 ENCOUNTER — Ambulatory Visit: Admitting: Podiatry

## 2024-07-27 ENCOUNTER — Ambulatory Visit

## 2024-07-31 ENCOUNTER — Other Ambulatory Visit: Payer: Self-pay | Admitting: Obstetrics

## 2024-07-31 DIAGNOSIS — N390 Urinary tract infection, site not specified: Secondary | ICD-10-CM

## 2024-07-31 DIAGNOSIS — R339 Retention of urine, unspecified: Secondary | ICD-10-CM

## 2024-08-02 ENCOUNTER — Ambulatory Visit: Payer: Self-pay | Admitting: Obstetrics

## 2024-08-07 ENCOUNTER — Ambulatory Visit: Admitting: Obstetrics

## 2024-08-07 ENCOUNTER — Other Ambulatory Visit: Payer: Self-pay | Admitting: Physician Assistant

## 2024-08-07 DIAGNOSIS — E782 Mixed hyperlipidemia: Secondary | ICD-10-CM

## 2024-08-08 ENCOUNTER — Ambulatory Visit: Payer: Self-pay | Admitting: Nurse Practitioner

## 2024-08-16 ENCOUNTER — Ambulatory Visit
Admission: RE | Admit: 2024-08-16 | Discharge: 2024-08-16 | Disposition: A | Discharge status: Home / Self Care | Source: Ambulatory Visit | Attending: Physician Assistant | Admitting: Physician Assistant

## 2024-08-16 DIAGNOSIS — Z1231 Encounter for screening mammogram for malignant neoplasm of breast: Secondary | ICD-10-CM | POA: Insufficient documentation

## 2024-08-25 ENCOUNTER — Ambulatory Visit: Admitting: Obstetrics

## 2024-08-31 ENCOUNTER — Ambulatory Visit: Admitting: Obstetrics

## 2024-10-13 ENCOUNTER — Ambulatory Visit: Admitting: Podiatry

## 2024-10-23 ENCOUNTER — Other Ambulatory Visit

## 2024-10-23 ENCOUNTER — Ambulatory Visit: Admitting: Oncology

## 2024-10-23 ENCOUNTER — Ambulatory Visit

## 2024-10-30 ENCOUNTER — Ambulatory Visit: Admitting: Physician Assistant

## 2025-06-11 ENCOUNTER — Ambulatory Visit: Admitting: Internal Medicine
